# Patient Record
Sex: Female | Born: 1945 | Race: White | Hispanic: No | Marital: Single | Smoking: Never smoker
Health system: Southern US, Community
[De-identification: ages and names within clinical notes are randomized; demographics above are authoritative.]

## PROBLEM LIST (undated history)

## (undated) DIAGNOSIS — E119 Type 2 diabetes mellitus without complications: Secondary | ICD-10-CM

## (undated) DIAGNOSIS — M1990 Unspecified osteoarthritis, unspecified site: Secondary | ICD-10-CM

## (undated) DIAGNOSIS — IMO0001 Reserved for inherently not codable concepts without codable children: Secondary | ICD-10-CM

## (undated) DIAGNOSIS — R413 Other amnesia: Secondary | ICD-10-CM

## (undated) DIAGNOSIS — K589 Irritable bowel syndrome without diarrhea: Secondary | ICD-10-CM

## (undated) DIAGNOSIS — G894 Chronic pain syndrome: Secondary | ICD-10-CM

## (undated) DIAGNOSIS — I219 Acute myocardial infarction, unspecified: Secondary | ICD-10-CM

## (undated) DIAGNOSIS — F329 Major depressive disorder, single episode, unspecified: Secondary | ICD-10-CM

## (undated) DIAGNOSIS — M549 Dorsalgia, unspecified: Secondary | ICD-10-CM

## (undated) DIAGNOSIS — K219 Gastro-esophageal reflux disease without esophagitis: Secondary | ICD-10-CM

## (undated) DIAGNOSIS — E785 Hyperlipidemia, unspecified: Secondary | ICD-10-CM

## (undated) DIAGNOSIS — F410 Panic disorder [episodic paroxysmal anxiety] without agoraphobia: Secondary | ICD-10-CM

## (undated) DIAGNOSIS — N189 Chronic kidney disease, unspecified: Secondary | ICD-10-CM

## (undated) DIAGNOSIS — E041 Nontoxic single thyroid nodule: Secondary | ICD-10-CM

## (undated) DIAGNOSIS — IMO0002 Reserved for concepts with insufficient information to code with codable children: Secondary | ICD-10-CM

## (undated) DIAGNOSIS — I509 Heart failure, unspecified: Secondary | ICD-10-CM

## (undated) DIAGNOSIS — M810 Age-related osteoporosis without current pathological fracture: Secondary | ICD-10-CM

## (undated) DIAGNOSIS — E669 Obesity, unspecified: Secondary | ICD-10-CM

## (undated) DIAGNOSIS — F32A Depression, unspecified: Secondary | ICD-10-CM

## (undated) DIAGNOSIS — I10 Essential (primary) hypertension: Secondary | ICD-10-CM

## (undated) DIAGNOSIS — Z9289 Personal history of other medical treatment: Secondary | ICD-10-CM

## (undated) DIAGNOSIS — F419 Anxiety disorder, unspecified: Secondary | ICD-10-CM

## (undated) DIAGNOSIS — G4730 Sleep apnea, unspecified: Secondary | ICD-10-CM

## (undated) DIAGNOSIS — R569 Unspecified convulsions: Secondary | ICD-10-CM

## (undated) HISTORY — PX: BACK SURGERY: SHX140

## (undated) HISTORY — DX: Reserved for concepts with insufficient information to code with codable children: IMO0002

## (undated) HISTORY — PX: REPLACEMENT TOTAL KNEE: SUR1224

## (undated) HISTORY — DX: Chronic pain syndrome: G89.4

## (undated) HISTORY — DX: Depression, unspecified: F32.A

## (undated) HISTORY — DX: Unspecified osteoarthritis, unspecified site: M19.90

## (undated) HISTORY — PX: TONSILLECTOMY: SUR1361

## (undated) HISTORY — PX: ABDOMINAL HYSTERECTOMY: SHX81

## (undated) HISTORY — DX: Other amnesia: R41.3

## (undated) HISTORY — DX: Dorsalgia, unspecified: M54.9

## (undated) HISTORY — DX: Gastro-esophageal reflux disease without esophagitis: K21.9

## (undated) HISTORY — PX: CHOLECYSTECTOMY: SHX55

## (undated) HISTORY — DX: Hyperlipidemia, unspecified: E78.5

## (undated) HISTORY — DX: Major depressive disorder, single episode, unspecified: F32.9

## (undated) HISTORY — PX: CARDIAC CATHETERIZATION: SHX172

## (undated) HISTORY — DX: Obesity, unspecified: E66.9

## (undated) HISTORY — DX: Nontoxic single thyroid nodule: E04.1

## (undated) HISTORY — DX: Age-related osteoporosis without current pathological fracture: M81.0

## (undated) HISTORY — DX: Type 2 diabetes mellitus without complications: E11.9

## (undated) HISTORY — PX: APPENDECTOMY: SHX54

## (undated) HISTORY — DX: Anxiety disorder, unspecified: F41.9

## (undated) HISTORY — PX: OOPHORECTOMY: SHX86

## (undated) HISTORY — PX: FOOT SURGERY: SHX648

---

## 1998-02-06 ENCOUNTER — Other Ambulatory Visit: Admission: RE | Admit: 1998-02-06 | Discharge: 1998-02-06 | Payer: Self-pay | Admitting: Obstetrics and Gynecology

## 1998-11-10 ENCOUNTER — Other Ambulatory Visit: Admission: RE | Admit: 1998-11-10 | Discharge: 1998-11-10 | Payer: Self-pay | Admitting: *Deleted

## 1999-08-27 ENCOUNTER — Other Ambulatory Visit: Admission: RE | Admit: 1999-08-27 | Discharge: 1999-08-27 | Payer: Self-pay | Admitting: Gynecology

## 1999-11-06 ENCOUNTER — Encounter (INDEPENDENT_AMBULATORY_CARE_PROVIDER_SITE_OTHER): Payer: Self-pay | Admitting: Specialist

## 1999-11-06 ENCOUNTER — Inpatient Hospital Stay (HOSPITAL_COMMUNITY): Admission: RE | Admit: 1999-11-06 | Discharge: 1999-11-09 | Payer: Self-pay | Admitting: Gynecology

## 2000-12-04 ENCOUNTER — Other Ambulatory Visit: Admission: RE | Admit: 2000-12-04 | Discharge: 2000-12-04 | Payer: Self-pay | Admitting: Gynecology

## 2001-03-03 ENCOUNTER — Inpatient Hospital Stay (HOSPITAL_COMMUNITY): Admission: EM | Admit: 2001-03-03 | Discharge: 2001-03-05 | Payer: Self-pay | Admitting: Emergency Medicine

## 2001-03-05 ENCOUNTER — Encounter: Payer: Self-pay | Admitting: Cardiology

## 2001-03-17 ENCOUNTER — Encounter: Admission: RE | Admit: 2001-03-17 | Discharge: 2001-03-17 | Payer: Self-pay

## 2001-03-19 ENCOUNTER — Ambulatory Visit (HOSPITAL_COMMUNITY): Admission: RE | Admit: 2001-03-19 | Discharge: 2001-03-19 | Payer: Self-pay

## 2001-04-02 ENCOUNTER — Encounter (INDEPENDENT_AMBULATORY_CARE_PROVIDER_SITE_OTHER): Payer: Self-pay | Admitting: Specialist

## 2001-04-02 ENCOUNTER — Observation Stay: Admission: RE | Admit: 2001-04-02 | Discharge: 2001-04-03 | Payer: Self-pay

## 2002-01-20 ENCOUNTER — Encounter (HOSPITAL_COMMUNITY): Admission: RE | Admit: 2002-01-20 | Discharge: 2002-02-19 | Payer: Self-pay | Admitting: *Deleted

## 2003-04-04 ENCOUNTER — Ambulatory Visit (HOSPITAL_COMMUNITY): Admission: RE | Admit: 2003-04-04 | Discharge: 2003-04-04 | Payer: Self-pay | Admitting: Family Medicine

## 2003-04-04 ENCOUNTER — Encounter: Payer: Self-pay | Admitting: Family Medicine

## 2003-06-02 ENCOUNTER — Ambulatory Visit (HOSPITAL_COMMUNITY): Admission: RE | Admit: 2003-06-02 | Discharge: 2003-06-02 | Payer: Self-pay | Admitting: Orthopedic Surgery

## 2003-06-10 ENCOUNTER — Ambulatory Visit (HOSPITAL_COMMUNITY): Admission: RE | Admit: 2003-06-10 | Discharge: 2003-06-10 | Payer: Self-pay | Admitting: *Deleted

## 2003-06-10 ENCOUNTER — Encounter: Payer: Self-pay | Admitting: *Deleted

## 2003-06-13 ENCOUNTER — Encounter: Payer: Self-pay | Admitting: Orthopedic Surgery

## 2003-06-14 ENCOUNTER — Inpatient Hospital Stay (HOSPITAL_COMMUNITY): Admission: RE | Admit: 2003-06-14 | Discharge: 2003-06-17 | Payer: Self-pay | Admitting: Orthopedic Surgery

## 2003-06-17 ENCOUNTER — Inpatient Hospital Stay
Admission: RE | Admit: 2003-06-17 | Discharge: 2003-06-24 | Payer: Self-pay | Admitting: Physical Medicine & Rehabilitation

## 2003-06-17 ENCOUNTER — Encounter (HOSPITAL_COMMUNITY)
Admission: RE | Admit: 2003-06-17 | Discharge: 2003-06-18 | Payer: Self-pay | Admitting: Physical Medicine & Rehabilitation

## 2003-06-20 ENCOUNTER — Ambulatory Visit (HOSPITAL_COMMUNITY)
Admission: RE | Admit: 2003-06-20 | Discharge: 2003-06-20 | Payer: Self-pay | Admitting: Physical Medicine & Rehabilitation

## 2003-11-22 ENCOUNTER — Encounter: Admission: RE | Admit: 2003-11-22 | Discharge: 2003-11-22 | Payer: Self-pay | Admitting: Sports Medicine

## 2004-02-23 ENCOUNTER — Inpatient Hospital Stay (HOSPITAL_COMMUNITY): Admission: EM | Admit: 2004-02-23 | Discharge: 2004-02-28 | Payer: Self-pay | Admitting: Psychiatry

## 2004-02-23 ENCOUNTER — Emergency Department (HOSPITAL_COMMUNITY): Admission: EM | Admit: 2004-02-23 | Discharge: 2004-02-23 | Payer: Self-pay | Admitting: Emergency Medicine

## 2004-02-24 ENCOUNTER — Emergency Department (HOSPITAL_COMMUNITY): Admission: EM | Admit: 2004-02-24 | Discharge: 2004-02-24 | Payer: Self-pay | Admitting: Emergency Medicine

## 2004-02-28 ENCOUNTER — Ambulatory Visit (HOSPITAL_COMMUNITY): Admission: RE | Admit: 2004-02-28 | Discharge: 2004-02-28 | Payer: Self-pay | Admitting: Radiation Oncology

## 2004-03-15 ENCOUNTER — Emergency Department (HOSPITAL_COMMUNITY): Admission: EM | Admit: 2004-03-15 | Discharge: 2004-03-15 | Payer: Self-pay

## 2004-07-10 ENCOUNTER — Ambulatory Visit: Payer: Self-pay | Admitting: Psychiatry

## 2004-10-02 ENCOUNTER — Ambulatory Visit: Payer: Self-pay | Admitting: Psychiatry

## 2004-11-27 ENCOUNTER — Ambulatory Visit: Payer: Self-pay | Admitting: Psychiatry

## 2005-01-25 ENCOUNTER — Ambulatory Visit: Payer: Self-pay | Admitting: Psychology

## 2005-02-26 ENCOUNTER — Ambulatory Visit: Payer: Self-pay | Admitting: Psychology

## 2005-04-26 ENCOUNTER — Ambulatory Visit: Payer: Self-pay | Admitting: Psychology

## 2005-06-04 ENCOUNTER — Ambulatory Visit: Payer: Self-pay | Admitting: Psychology

## 2005-07-05 ENCOUNTER — Ambulatory Visit: Payer: Self-pay | Admitting: Psychology

## 2005-08-20 ENCOUNTER — Ambulatory Visit: Payer: Self-pay | Admitting: Psychiatry

## 2005-10-08 ENCOUNTER — Ambulatory Visit: Payer: Self-pay | Admitting: Psychology

## 2005-11-01 ENCOUNTER — Ambulatory Visit: Payer: Self-pay | Admitting: Psychology

## 2005-11-12 ENCOUNTER — Ambulatory Visit: Payer: Self-pay | Admitting: Psychiatry

## 2006-01-03 ENCOUNTER — Ambulatory Visit: Payer: Self-pay | Admitting: Psychology

## 2006-01-09 ENCOUNTER — Ambulatory Visit (HOSPITAL_COMMUNITY): Payer: Self-pay | Admitting: Psychiatry

## 2006-03-04 ENCOUNTER — Ambulatory Visit (HOSPITAL_COMMUNITY): Payer: Self-pay | Admitting: Psychiatry

## 2006-03-13 ENCOUNTER — Ambulatory Visit (HOSPITAL_COMMUNITY): Payer: Self-pay | Admitting: Psychology

## 2006-04-03 ENCOUNTER — Ambulatory Visit (HOSPITAL_COMMUNITY): Payer: Self-pay | Admitting: Psychiatry

## 2006-05-21 ENCOUNTER — Ambulatory Visit (HOSPITAL_COMMUNITY): Payer: Self-pay | Admitting: Psychology

## 2006-06-24 ENCOUNTER — Ambulatory Visit (HOSPITAL_COMMUNITY): Payer: Self-pay | Admitting: Psychiatry

## 2006-08-21 ENCOUNTER — Ambulatory Visit (HOSPITAL_COMMUNITY): Payer: Self-pay | Admitting: Psychiatry

## 2006-09-17 ENCOUNTER — Inpatient Hospital Stay (HOSPITAL_COMMUNITY): Admission: RE | Admit: 2006-09-17 | Discharge: 2006-09-23 | Payer: Self-pay | Admitting: Orthopedic Surgery

## 2006-09-18 ENCOUNTER — Ambulatory Visit: Payer: Self-pay | Admitting: Physical Medicine & Rehabilitation

## 2006-09-28 ENCOUNTER — Ambulatory Visit: Payer: Self-pay | Admitting: Orthopedic Surgery

## 2006-09-30 ENCOUNTER — Ambulatory Visit: Payer: Self-pay | Admitting: Orthopedic Surgery

## 2006-10-03 ENCOUNTER — Ambulatory Visit: Payer: Self-pay | Admitting: Orthopedic Surgery

## 2006-11-18 ENCOUNTER — Ambulatory Visit (HOSPITAL_COMMUNITY): Payer: Self-pay | Admitting: Psychiatry

## 2007-01-15 ENCOUNTER — Ambulatory Visit (HOSPITAL_COMMUNITY): Payer: Self-pay | Admitting: Psychiatry

## 2007-03-10 ENCOUNTER — Ambulatory Visit (HOSPITAL_COMMUNITY): Payer: Self-pay | Admitting: Psychiatry

## 2007-05-12 ENCOUNTER — Ambulatory Visit (HOSPITAL_COMMUNITY): Payer: Self-pay | Admitting: Psychiatry

## 2007-07-07 ENCOUNTER — Ambulatory Visit (HOSPITAL_COMMUNITY): Payer: Self-pay | Admitting: Psychiatry

## 2007-09-08 ENCOUNTER — Ambulatory Visit (HOSPITAL_COMMUNITY): Payer: Self-pay | Admitting: Psychiatry

## 2007-10-27 ENCOUNTER — Ambulatory Visit (HOSPITAL_COMMUNITY): Payer: Self-pay | Admitting: Psychiatry

## 2007-11-20 ENCOUNTER — Inpatient Hospital Stay (HOSPITAL_COMMUNITY): Admission: AD | Admit: 2007-11-20 | Discharge: 2007-11-25 | Payer: Self-pay | Admitting: Urology

## 2007-12-24 ENCOUNTER — Observation Stay (HOSPITAL_COMMUNITY): Admission: EM | Admit: 2007-12-24 | Discharge: 2007-12-27 | Payer: Self-pay | Admitting: Emergency Medicine

## 2008-01-21 ENCOUNTER — Ambulatory Visit (HOSPITAL_COMMUNITY): Payer: Self-pay | Admitting: Psychiatry

## 2008-03-03 ENCOUNTER — Ambulatory Visit (HOSPITAL_COMMUNITY): Payer: Self-pay | Admitting: Psychiatry

## 2008-03-08 ENCOUNTER — Inpatient Hospital Stay (HOSPITAL_COMMUNITY): Admission: AD | Admit: 2008-03-08 | Discharge: 2008-03-08 | Payer: Self-pay | Admitting: Gynecology

## 2008-05-03 ENCOUNTER — Ambulatory Visit (HOSPITAL_COMMUNITY): Payer: Self-pay | Admitting: Psychiatry

## 2008-05-16 ENCOUNTER — Inpatient Hospital Stay (HOSPITAL_COMMUNITY): Admission: EM | Admit: 2008-05-16 | Discharge: 2008-05-19 | Payer: Self-pay | Admitting: Emergency Medicine

## 2008-05-17 ENCOUNTER — Ambulatory Visit (HOSPITAL_COMMUNITY): Payer: Self-pay | Admitting: Psychiatry

## 2008-06-28 ENCOUNTER — Ambulatory Visit (HOSPITAL_COMMUNITY): Payer: Self-pay | Admitting: Psychiatry

## 2008-07-11 ENCOUNTER — Ambulatory Visit: Payer: Self-pay | Admitting: Internal Medicine

## 2008-07-11 ENCOUNTER — Inpatient Hospital Stay (HOSPITAL_COMMUNITY): Admission: EM | Admit: 2008-07-11 | Discharge: 2008-07-26 | Payer: Self-pay | Admitting: Emergency Medicine

## 2008-07-11 ENCOUNTER — Ambulatory Visit: Payer: Self-pay | Admitting: Cardiovascular Disease

## 2008-07-13 ENCOUNTER — Encounter (INDEPENDENT_AMBULATORY_CARE_PROVIDER_SITE_OTHER): Payer: Self-pay | Admitting: Internal Medicine

## 2008-07-14 ENCOUNTER — Ambulatory Visit: Payer: Self-pay | Admitting: Physical Medicine & Rehabilitation

## 2008-08-04 ENCOUNTER — Ambulatory Visit (HOSPITAL_COMMUNITY): Payer: Self-pay | Admitting: Psychiatry

## 2008-08-23 ENCOUNTER — Ambulatory Visit (HOSPITAL_COMMUNITY): Payer: Self-pay | Admitting: Psychiatry

## 2008-09-06 ENCOUNTER — Encounter: Admission: RE | Admit: 2008-09-06 | Discharge: 2008-09-06 | Payer: Self-pay | Admitting: Neurology

## 2008-09-22 ENCOUNTER — Ambulatory Visit (HOSPITAL_COMMUNITY): Payer: Self-pay | Admitting: Psychiatry

## 2008-09-30 ENCOUNTER — Ambulatory Visit (HOSPITAL_COMMUNITY): Admission: RE | Admit: 2008-09-30 | Discharge: 2008-09-30 | Payer: Self-pay | Admitting: Neurology

## 2008-10-25 ENCOUNTER — Ambulatory Visit (HOSPITAL_COMMUNITY): Admission: RE | Admit: 2008-10-25 | Discharge: 2008-10-25 | Payer: Self-pay | Admitting: Internal Medicine

## 2008-11-17 ENCOUNTER — Ambulatory Visit (HOSPITAL_COMMUNITY): Payer: Self-pay | Admitting: Psychiatry

## 2008-11-23 ENCOUNTER — Ambulatory Visit (HOSPITAL_COMMUNITY): Payer: Self-pay | Admitting: Psychology

## 2008-12-08 ENCOUNTER — Ambulatory Visit (HOSPITAL_COMMUNITY): Payer: Self-pay | Admitting: Psychology

## 2008-12-23 ENCOUNTER — Ambulatory Visit (HOSPITAL_COMMUNITY): Payer: Self-pay | Admitting: Psychology

## 2009-01-12 ENCOUNTER — Ambulatory Visit (HOSPITAL_COMMUNITY): Payer: Self-pay | Admitting: Psychiatry

## 2009-01-23 ENCOUNTER — Ambulatory Visit (HOSPITAL_COMMUNITY): Payer: Self-pay | Admitting: Psychology

## 2009-03-28 ENCOUNTER — Ambulatory Visit (HOSPITAL_COMMUNITY): Payer: Self-pay | Admitting: Psychiatry

## 2009-03-31 ENCOUNTER — Inpatient Hospital Stay: Payer: Self-pay | Admitting: Unknown Physician Specialty

## 2009-04-25 ENCOUNTER — Ambulatory Visit (HOSPITAL_COMMUNITY): Payer: Self-pay | Admitting: Psychiatry

## 2009-07-20 ENCOUNTER — Ambulatory Visit (HOSPITAL_COMMUNITY): Payer: Self-pay | Admitting: Psychiatry

## 2009-08-17 ENCOUNTER — Inpatient Hospital Stay: Payer: Self-pay | Admitting: Internal Medicine

## 2009-09-20 ENCOUNTER — Ambulatory Visit: Payer: Self-pay | Admitting: Pain Medicine

## 2010-01-17 ENCOUNTER — Ambulatory Visit: Payer: Self-pay | Admitting: Pain Medicine

## 2011-02-09 ENCOUNTER — Inpatient Hospital Stay: Payer: Self-pay | Admitting: Internal Medicine

## 2011-03-19 NOTE — H&P (Signed)
NAMENAYSHA, SHOLL NO.:  [id number]   MEDICAL RECORD NO.:  [id number]          PATIENT TYPE:  INP   LOCATION:  1506                         FACILITY:  [facility]   PHYSICIAN:  Lonia Blood, M.D.       DATE OF BIRTH:  Nov 02, 1946   DATE OF ADMISSION:  12/24/2007  DATE OF DISCHARGE:                              HISTORY & PHYSICAL   PRIMARY CARE PHYSICIAN:  Estell Harpin, M.D.   CHIEF COMPLAINTS:  Back pain.   HISTORY OF PRESENT ILLNESS:  Mrs. Karabin is a 65 year old woman with  known uncontrolled diabetes mellitus type 2 who presented to the  emergency room after about 5 days of worsening sore on the back with  back pain.  The patient noticed for the first time edema and erythema on  the back about 5 days prior to admission.  She went to her primary care  physician who prescribed some Keflex.  The patient continued to have  worsening of her back pain, also noticed redness on the stomach as well.  The patient also noticed a lump in the right axilla as well.  As she was  thinking that all these things will get out of control, she reported the  emergency room to get help today.  The patient  complains also of some  chills and she is worried that these ulcers will get out of control.   PAST MEDICAL HISTORY:  1. Diabetes mellitus type 2 uncontrolled.  2. Fibromyalgia.  3. Widespread osteoarthritis.  4. Depression with suicide attempt.  5. Recurrent cystitis.   HOME MEDICATIONS:  1. Percocet 10/325 three times a day.  2. Lexapro 10 mg a day.  3. Xanax 1 mg three times day.  4. Ambien 10 mg at bedtime.  5. Lantus 50 units at bedtime.  6. Metformin 1000 mg two times a day.   FAMILY HISTORY:  The patient's father died with lung cancer.  The  patient's mother died with stomach cancer.   SOCIAL HISTORY:  The patient lives alone.  She does smoke cigarettes,  does not drink alcohol.  She has three healthy children.  She is on  disability due to her depression.  She  denies any substance abuse.   ALLERGIES:  NO KNOWN DRUG ALLERGIES.   REVIEW OF SYSTEMS:  Diffuse musculoskeletal pain, neuropathy in the  lower extremities, chronic anxiety.  Other systems per HPI.  All other  systems are negative.   PHYSICAL EXAMINATION:  VITAL SIGNS:  Temperature of 100.2, blood  pressure 111/58, pulse of 100, respirations 20, saturation 90% on room  air.  GENERAL:  An obese, anxious woman sitting on the stretcher in no acute  distress, alert to place, person and time.  HEENT:  Normocephalic, atraumatic.  Pupils are equal, round and reactive  to light and accommodation.  Extraocular motions intact.  Throat clear.  NECK:  Supple.  No JVD.  CARDIOVASCULAR:  Regular rate and rhythm without murmurs, rubs or  gallops.  CHEST:  On the back of the chest there is 4 cm area of erythema with  central scabbing consistent  with cellulitis.  LUNGS:  Clear to auscultation without wheezes, rhonchi or crackles.  ABDOMEN:  The abdominal wall on the right lower quadrant has about 10 cm  area of erythema with a central scabbing.  No definite abscess  formation.  Bowel sounds are present.  Abdomen is without any deep  tenderness and no guarding or rebound.  EXTREMITIES:  Without edema without any skin changes.   LABORATORY DATA:  White blood cell count 10,000, hemoglobin 13, platelet  count 187,000, glucose is 187.  Sodium 139, potassium 3.8, bicarb 26,  calcium 9.2, albumin 3.5.   ASSESSMENT AND PLAN:  A 65 year old woman with uncontrolled diabetes  mellitus presenting with multiple skin abscesses and cellulitis  consistent with a community-acquired methicillin resistant  Staphylococcus aureus.  Mrs. Jessie will be admitted to a regular room.  She will have wound cultures and blood cultures.  The patient will be  placed on isolation.  Empiric vancomycin will be started.  The diabetes  will be controlled with Lantus and sliding scale insulin.  Hemoglobin  A1c will be measured to  have to see her chronic control.  Mrs. Loring  will be continued on her Lexapro and Xanax without any changes.      Lonia Blood, M.D.  Electronically Signed     SL/MEDQ  D:  12/24/2007  T:  12/25/2007  Job:  [id number]

## 2011-03-19 NOTE — Discharge Summary (Signed)
NAMEISADORE, BOKHARI NO.:  [id number]   MEDICAL RECORD NO.:  [id number]          PATIENT TYPE:  INP   LOCATION:  3003                         FACILITY:  [facility]   PHYSICIAN:  Alvester Morin, M.D.  DATE OF BIRTH:  1946/10/13   DATE OF ADMISSION:  07/11/2008  DATE OF DISCHARGE:                               DISCHARGE SUMMARY   ADDENDUM:  This is an addendum to previously dictated discharge summary  for Ms. Colon Branch.  Ms. Miki Labuda had an uneventful hospital  stay since her last dictation, while skilled nursing facility bed was  awaited.  She was discharged on July 26, 2008.  Her medical drug  list remains unchanged.   VITAL SIGNS:  On the day of discharge her vitals were as follows:  Temperature 98.3, pulse 85, respirations 18, systolic blood pressure  122, diastolic blood pressure was 75.  She was saturating 94% on room  air.   DISCHARGE LABS:  The patient had a WBC count of 5.6, hemoglobin 13.1,  hematocrit 39.3, platelet of 186,000.  Sodium 138, potassium 4.1,  chloride 106, bicarb 26, glucose 159, BUN 9, creatinine 0.73.      Clerance Lav, MD  Electronically Signed      Alvester Morin, M.D.  Electronically Signed    RS/MEDQ  D:  07/26/2008  T:  07/26/2008  Job:  [id number]

## 2011-03-19 NOTE — H&P (Signed)
[name], [name] NO.:  [id number]   MEDICAL RECORD NO.:  [id number]          PATIENT TYPE:  INP   LOCATION:  1505                         FACILITY:  [facility]   PHYSICIAN:  Jamison Neighbor, M.D.  DATE OF BIRTH:  05/27/1946   DATE OF ADMISSION:  11/20/2007  DATE OF DISCHARGE:                              HISTORY & PHYSICAL   ADMITTING DIAGNOSES:  1. Chronic cystitis.  2. Gross hematuria.  3. Out of control diabetes mellitus.   HISTORY:  This 65 year old is undergoing evaluation for problems of  chronic cystitis.  She has had a recent scan which showed normal upper  tracks.  The patient has had persistent hematuria, so she underwent a  cystoscopic examination today.   Cystoscopy was performed.  The bladder was carefully inspected and we  could not find any evidence of bleeding within the bladder.  We do know,  however, that her urine still is abnormal despite antibiotic therapy and  the patient's overall condition is worsening.   My biggest concern is that her sugar is now consistently in the 400 to  500 range, and I feel that she needs to be brought into the hospital for  antibiotic therapy and management of the diabetes.  Her medical doctor,  to my understanding does not admit people to the hospital, so we will  have the [facility] hospitalist service work with her while she is in the  hospital.   The patient has multiple health problems.  She takes metformin for her  diabetes, metoprolol XL for her heart condition, Lexapro, Ambien,  Glipizide, Xanax for anxiety, and she had been taking Abilify, but said  she stopped taking it because she could not tolerate it.  She says her  biggest problem right now is arthritis.  She also notes major problems  with recent weight gain, saying she has gained over 100 pounds over the  past year.  She has seen the podiatrist because of persistent foot  problems, as well.   The patient could not provide a lot of additional  information including  family history, social history and review of systems.   PHYSICAL EXAMINATION:  On examination today she is an ill-appearing  female in no acute distress.  HEENT:  Normocephalic and atraumatic.  Cranial nerves 2-12 were grossly  intact.  NECK:  Supple, no adenopathy or thyromegaly.  LUNGS:  Clear.  HEART:  Regular rate and rhythm, without murmurs, thrills, gallops,  rubs, or heaves.  ABDOMEN:  Obese.  There is tenderness above the suprapubic area.  She  has got a little bit of flank tenderness on both sides, but is somewhat  vague in her response.  PELVIC:  Shows some degree of atrophic vaginitis and a lot of irritation  in the vaginal area.  We are going to try to treat that with estrogen  cream.  EXTREMITIES:  No cyanosis, clubbing, or edema.   IMPRESSION:  1. Gross hematuria.  2. Chronic pyelonephritis.  3. Out of control diabetes.   PLAN:  Admit for antibiotic therapy and consultation with medicine.  Jamison Neighbor, M.D.  Electronically Signed     RJE/MEDQ  D:  11/20/2007  T:  11/20/2007  Job:  [id number]

## 2011-03-19 NOTE — Discharge Summary (Signed)
[name], [name] NO.:  [id number]   MEDICAL RECORD NO.:  [id number]          PATIENT TYPE:  INP   LOCATION:  3741                         FACILITY:  [facility]   PHYSICIAN:  Alvester Morin, M.D.  DATE OF BIRTH:  August 11, 1946   DATE OF ADMISSION:  07/11/2008  DATE OF DISCHARGE:                               DISCHARGE SUMMARY   DISCHARGE DIAGNOSES:  1. Rhabdomyolysis & Acute renal failure.  2. Major Depression with sucidal ideation.  3. Urinary tract infection.  4. Diabetes mellitus type 2.  5. Hypertension.  6. Leukocytosis.   DISCHARGE MEDICATIONS:  1. Amlodipine 5mg  p.o. daily.  2. Aspirin 325 mg p.o. daily.  3. Cymbalta dose to be determined.  4. Lantus insulin 22 units before night going to dinner.  5. MiraLax daily.  6. Potassium chloride 40 mEq daily.  7. Ambien  10 mg p.o. daily.  8. Ativan 1 mg p.o. 3 times a day as needed.  9. Percocet  5/325 q.6 h p.r.n.   DISPOSITION AND FOLLOWUP:  We plan to arrange skilled nursing facilty  discharge for the patient.  The patient will follow up with her primary  care Tamy Accardo after SNF release.  She will make appointment by herself.   PROCURERS PERFORMED:  None.   CONSULTATION:  Inpatient Psychiatrist Dr. Jeanie Sewer.   ADMITTING HISTORY AND PHYSICAL:  A 65 -year-old white female with  history of frequent fall, severe deconditioning, hypertension, diabetes,  UTI, including pyelonephritis and major depression presented with  history of fall one day before the admission.  She fell in the afternoon  one day prior to day of admission and could not move for 18 hours until  discovered by her family on the morning of the day of admission. The  patient was lying on the floor unable to get up. Pt was conscious.  laying on the floor and had bruises on the arm and cheek.  The patient  reports that she was walking with help of her walker to go the bathroom  at the time of fall.  She lost her balance and fell down.   Subsequently  tried to crawl but could not get up due to her size and severe  deconditioning, that she had experienced after discharged from Skilled  Nursing Facility 3 weeks ago.  She had no reports of fever, chills,  frequency, urgency, diarrhea, dizziness, or blackout before or after the  fall.  She has no history of syncope.  She has a history of abdominal  pain that is ongoing for few months.  She grades her abdominal pain  7/10, which is mainly at the lower abdomen and also on the left side of  the abdomen.  Her pain does not radiate.  There is no aggravating or  relieving factor known.  Her pain is constant and is not associated with  the bowel or bladder movements.  She reports tingling and numbness in  her lower feet bilaterally.  She fears that she is having a stroke.  She  also has the history of tremors that had started a few months ago  when  she was put on a new medication, Abilify.  She has no complain of  distrubance in speech, vision, or arm strength appreciated.   ALLERGIES:  She is not known to be allergic to any medications.   PAST MEDICAL HISTORY:  Positive for hypertension, diabetes with HbA1c of  9.0 on December 24, 2007 and 7.7 on October 18, 2007.  She has obesity.  She had MRSA abscess in February 2009.  She has history of arthritis,  anxiety, depression, urinary tract infection in May 19, 2008 with E.  coli and treated with Rocephin for 10 days.  She had pyelonephritis in  February 2009, and she also history of fibromyalgia.   SURGICAL HISTORY:  She had hysterectomy when she was 65 years old.  She  had knee replacement bilaterally.  The right knee was replaced in 2004,  and left was replaced in 2007.  On admission, she provided the history  of taking Xanax 1 mg 2 times a day.  However, she describes that she had  discontinued taking the medication at the end of August.  Her medication  of admission: Lantus  45 units, metformin 100 mg twice a day, Percocet   10 mg 3 times a day, Abilify 20 mg nightly, Actos 45 mg once a day,  amlodipine 5 mg once a day, citalopram 40 mg once a day and Januvia 100  mg once a day.  She had finished taking Ciprofloxacin day 10 on the day  of admission.  She has no substance or alcohol abuse history at that  time of admission.   SOCIAL HISTORY:  She is single.  She is educated up to high school.  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  She  used be a Location manager and worked until 2005.  At that time, she  applied for disability.  She lives at the [facility] by herself.   TOTAL FAMILY HISTORY:  Her mother had a stomach cancer at the age 67.  She was a smoker.  Her father died of lung cancer at age of 7 and he  was smoker.  She has 2 sisters and a brother.  They are alive and  healthy.  She has 2 sons and a daughter and they are healthy.   REVIEW OF SYSTEMS:  As per HPI.   PHYSICAL EXAMINATION:  Her physical exam at the time of presentation,  VITAL SIGNS:  Temperature 99.6, blood pressure 143/80, pulse 112,  respiratory rate of 12, and O2 sat of 98% on air.  GENERAL:  She was anxious and sweating in the bed.  EYE:  EOMI.  PERRLA.  Left side ecchymosis from the fall.  HEENT:  No ear discharge.  Neck supple and full range of motion.  RESPIRATION:  Normal bilateral air entry.  No wheezing.  No shortness of  breath.  No rales or rhonchi.  CARDIOVASCULAR:  S1 and S2 normal.  No rales and murmur.  GI:  Abdomen was soft.  Tender on left lower side.  Fungal infection was  observed in the folds of abdomen.  EXTREMITIES:  No bruises.  No warmth or pain.  No fractures palpated.  GU:  Dark urine was observed in the poly bag.  No lymphadenopathy.  NEURO:  Alert and awake and oriented x3.  Nonfocal.  Neurological exam  lower and upper extremity power 3-4/5.  Sensation intact bilaterally.  Reflex is +1.  Tremor is present at rest  PSYCH:  She was anxious but in stable mood.  She rarely  made eye  contact.   LABS ON THE ADMISSION:  Sodium of 140, potassium of 3.1, chloride of  111, bicarbonate 20, BUN of 21, creatinine 1.7, and glucose of 161, CK  was 25956, CK-MB was 305.8.   CT and x-rays were within normal limits.  She also had x-ray that was  fine and chest, hip, and left humeral there were all within normal  limits.   TSH of 2.8 was observed.  Hemoglobin was 15, hematocrit of 44, white  blood cell count of 14.1, platelet of 188 with ANC of 11.8 and 83% were  neutrophils.   HOSPITAL COURSE:  1. Rhabdomyolysis & Acute renal failure.  The patient presented with a      history of fall that happened 18 hours ago.  Most likely thought to      be secondary to deconditioning and obesity.  She was unable to get      up or get help until she was discovered by her family.  She had CK-      MB and CK elevated.  Her CK was 41,591.  Her urine exam was      obtained, which was positive for myoglobin.  She was treated with      rapid normal saline infusion. She also had measurement of CK and CK-      MB daily.  She had steady improvement throughout her      hospitalization and her CK levels fell down to 356 on July 18, 2008.  Her creatinine lowered from 1.7 on presentation to 0.65 on      July 18, 2008.  She has likely completely resolved her      rhabdomyolysis.  2. Depression & Sucidal ideation: During her hospitalisation course,      patient communicated her desire to hurt herself to support staff.      Immediate measures were taken to prevent sucide and a sitter was      provided for sucide watch. Dr. Jeanie Sewer was consulted and initial      recommedation was made to transfer her to [facility]. However, due to her severe deconditioning, she was      not eligible. Dr. Jeanie Sewer was again consulted on 07/20/08 to      reassess her condtion and he determined that patient was no longer      sucidal and appropriate to release for  SNF. She was put on Cymbalta      and dose is being adjusted for maximum benifit.  3. Deconditioning.  The patient has been hospitalized in the past in      July 2009, and was discharged to Skilled Nursing Facility, which      helped her get more strength.  She is morbidly obese and also has      major depression.  All of these factors contributed to her rapid      deconditioning.  During the course of the hospitalization, PT/OT      consult was obtained.  She was encouraged to be physically active.      At present, it is determined that she is unable to perform the      daily activities,  at the most she can get out of the bed and get      up to the chair.  She needs a constant support.  The  patient will      be discharged to [facility] in [place].  They would      upon completion of treatment would release her to SNF where she can      have assistance and training to improve her deconditioning.  4. UTI.  The patient on admission had grossly abnormal urine.  Her      urinalysis had shown E. coli that were sensitive to ceftriaxone.      The patient was treated with 1 g ceftriaxone for 7 days.  The      patient was discharged home without any medication.  The patient      will follow up with her primary care Karisha Marlin and get a repeat      urinalysis for further management of UTI.  5. Diabetes mellitus.  The patient is treated with Lantus sliding      scale insulin during the hospital admission.  Her CBG on the day of      discharge ranged from 133 to 210.  The patient will be discharged      on 22 units of Lantus insulin.  6. Hypertension.  Her blood pressure on the date of admission was      142/88.  Her amlodipine was on hold until her blood pressure      recovered.  On the day of discharge, she was getting amlodipine and      her blood pressure was 148/75.  The patient will continue to have      amlodipine 5 mg upon discharge.  7. Leukocytosis.  The patient had a white  blood cell count of 14.1      with ANC of 11.8.  It was thought it was likely secondary to      rhabdomyolysis as well as UTI that patient was experiencing.  The      patient had ceftriaxone IV for      7 day to treat UTI as well as hydration and continuous vigilance      for  rhabdomyolysis which has resolved.  At the time of discharge,      her leukocytosis has resolved.   Discharge labs and vitals to be obtained at the day of discharge.  Addendum will be done.      Clerance Lav, MD  Electronically Signed      Alvester Morin, M.D.  Electronically Signed    RS/MEDQ  D:  07/18/2008  T:  07/19/2008  Job:  [id number]   cc:   Antonietta Breach, M.D.  Estell Harpin, M.D.

## 2011-03-19 NOTE — Discharge Summary (Signed)
Whitney, [name] NO.:  [id number]   MEDICAL RECORD NO.:  [id number]          PATIENT TYPE:  INP   LOCATION:  1301                         FACILITY:  [facility]   PHYSICIAN:  Hillery Aldo, M.D.   DATE OF BIRTH:  1946/09/13   DATE OF ADMISSION:  05/15/2008  DATE OF DISCHARGE:  05/19/2008                               DISCHARGE SUMMARY   ADDENDUM:   PRIMARY CARE PHYSICIAN:  Estell Harpin, M.D.   DISCHARGE DIAGNOSES:  1. Escherichia coli urinary tract infection.  2. Severe weakness and deconditioning.  3. Severe anxiety and depression.  4. Type 2 diabetes.  5. Chronic pain syndrome.  6. Morbid obesity.  7. Osteoarthritis.  8. Hypertension.  9. History of methicillin-resistant Staphylococcus aureus skin      abscesses.   DISCHARGE MEDICATIONS:  1. Xanax 1 mg p.o. q.i.d.  2. Norvasc 5 mg p.o. daily.  3. Abilify 20 mg p.o. nightly.  4. Aspirin 81 mg p.o. daily.  5. Ceftin 500 mg p.o. q.12 h. times 5 days.  6. Celexa 40 mg p.o. daily.  7. Lantus insulin 45 units subcutaneously nightly.  8. NovoLog sliding scale insulin every morning and at night -- insulin      resistant scale.  9. Metformin 1000 mg p.o. b.i.d.  10.OxyContin 10 mg p.o. q.12 h.  11.Protonix 40 mg p.o. daily.  12.Actos 45 mg p.o. daily.  13.Lyrica 50 mg p.o. t.i.d., titrate for pain relief.  14.Senokot 1 tablet nightly.  15.Januvia 100 mg p.o. daily.  16.Flexeril 10 mg p.o. q.8 h. p.r.n.  17.Oxycodone 5 mg p.o. q.4 h. p.r.n.  18.Ambien 10 mg p.o. nightly p.r.n.   CONSULTATIONS:  None.   BRIEF ADMISSION HISTORY OF PRESENT ILLNESS:  The patient is a 65-year-  old female who presented to the emergency department with complaints of  severe weakness and difficulty walking, as well as muscle spasms and  worsening pain in the setting of a history of chronic pain syndrome.  For the full details, please see the dictated report done by Dr.  Lovell Sheehan.   For a complete list of the procedures,  diagnostic studies and hospital  course through May 17, 2008, please see the previously dictated  discharge summary done by Dr. Lonia Blood.   REMAINDER OF HOSPITAL COURSE BY PROBLEM:  1. Weakness:  The patient continues to be weak and deconditioned.  She      will need intensive physical and occupational therapy in the      nursing home environment.  2. Escherichia coli urinary tract infection:  The patient was treated      with 3 days of therapy with Rocephin.  She will complete an      additional 5 days of therapy with Ceftin.  3. Diabetes mellitus:  The patient's diabetes is well-controlled, with      the regimen as outlined above.  4. Chronic pain syndrome:  The patient was put on OxyContin and Lyrica      was added.  At this point, she was achieving some measure of pain      control.  Her Lyrica should be titrated up as needed for ongoing      pain management.  5. Morbid obesity:  The patient should be maintained on a diabetic      carbohydrate-modified diet.  6. Hypertension:  The patient's blood pressure is well-controlled.  7. Anxiety/depression:  The patient should have an outpatient      psychiatric consultation and follow-up.  She was maintained on her      psychoactive therapies including Abilify and Xanax, which has been      titrated up due to ongoing complaints of tremulousness.   DISPOSITION:  The patient is medically stable, and will be discharged to  [facility] [place] skilled nursing facility when  transportation is arranged.      Hillery Aldo, M.D.  Electronically Signed     CR/MEDQ  D:  05/19/2008  T:  05/19/2008  Job:  [id number]   cc:   Estell Harpin, M.D.  Fax: [phone]

## 2011-03-19 NOTE — Consult Note (Signed)
NAMEPOSIE, LILLIBRIDGE NO.:  [id number]   MEDICAL RECORD NO.:  [id number]          PATIENT TYPE:  INP   LOCATION:  1506                         FACILITY:  [facility]   PHYSICIAN:  Adolph Pollack, M.D.DATE OF BIRTH:  10/16/1946   DATE OF CONSULTATION:  DATE OF DISCHARGE:                                 CONSULTATION   REASON FOR CONSULTATION:  Multiple subcutaneous abscesses.   HISTORY OF PRESENT ILLNESS:  This 65 year old female stated she had  noted some areas of redness on her right lower abdominal wall on  Saturday.  Had some fever, chills and a discomfort in her back and two  others in her right groin and one in her right axilla.  She presented to  the hospital yesterday because of discomfort and pain and was admitted  and placed on intravenous antibiotics (vancomycin).  We were asked to  see her today to see if there are any of these that need to be incised  and drained.  She states she has not had anything like this before.   PAST MEDICAL HISTORY:  1. Diabetes mellitus on insulin.  2. Hypertension.  3. Chronic cystitis.  4. Fibromyalgia.  5. Morbid obesity.  6. Degenerative joint disease.  7. Depression.  8. Neuropathy.   Previous abdominal operations:  None.  She has had two knee  replacements.   ALLERGIES:  None.   SOCIAL HISTORY:  Notable for the fact that she lives alone.  She does  not smoke or drink.   REVIEW OF SYSTEMS:  She said she has had some fever, mostly chills since  she has been here in the hospital.  CARDIOVASCULAR:  Has not had any  chest pain.  PULMONARY:  No shortness of breath.   PHYSICAL EXAM:  An overweight female weighing about 280 pounds.  VITAL SIGNS:  Her temperature is 99.8, blood pressure is 118/68, pulse  of 82.  ABDOMEN:  In the right lower quadrant there are two punctate draining  wounds and an area of surrounding induration and cellulitis but no  fluctuant region.  GU:  Down in the right groin there are two very  small 5-6 mm red areas  that have spontaneously drained with minimal induration.  EXTREMITIES:  There is a small area of erythema measuring about 6-7 mm  with no fluctuance and minimal induration in the right axilla.  BACK:  There is an area of erythema and induration in the left mid back  with a small draining wound.  This is larger, maybe measuring up to 8  cm, but again no fluctuant area.   LABORATORY DATA:  White blood cell count is normal at 9700.  Hemoglobin  A1c is 9.0.  Cultures are pending.   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  There is no obvious area that needs further  incision and drainage at this time.   PLAN:  Shower twice a day.  Broaden polymicrobial coverage and continue  the wound surveillance.  Adolph Pollack, M.D.  Electronically Signed     TJR/MEDQ  D:  12/25/2007  T:  12/26/2007  Job:  [id number]

## 2011-03-19 NOTE — Consult Note (Signed)
NAMEDESTYNI, HOPPEL NO.:  [id number]   MEDICAL RECORD NO.:  [id number]          PATIENT TYPE:  INP   LOCATION:  3003                         FACILITY:  [facility]   PHYSICIAN:  Antonietta Breach, M.D.  DATE OF BIRTH:  04-09-1946   DATE OF CONSULTATION:  07/20/2008  DATE OF DISCHARGE:                                 CONSULTATION   Ms. Akens is no longer having suicidal thoughts.  She still does have  depressed mood, decreased energy and decreased appetite.  She needed 1  mg of Ativan last night to sleep.  This was on top of her Ambien 10 mg.  She does not have any hallucinations or delusions.  She is cooperative  with bedside care.  She is motivated for further psychiatric treatment.   REVIEW OF SYSTEMS:  MUSCULOSKELETAL:  She is still very weak and is  requiring physical therapy for regaining ambulation.   LABORATORY DATA:  Sodium 142, BUN 13, creatinine 0.68, glucose 126.   PHYSICAL EXAMINATION:  VITAL SIGNS:  Temperature 97.0, pulse 94,  respiratory rate 20, blood pressure 135/79, O2 saturation on room air  95%.   MENTAL STATUS EXAM:  Ms. Recendiz is alert.  Her eye contact is  intermittent.  She is oriented to all spheres.  Her affect is slightly  anxious.  Her mood is depressed.  Concentration mildly decreased.  She  is oriented to all spheres.  Memory is intact to immediate, recent and  remote.  Her speech is slightly soft and with a mildly flat prosody.  There is no dysarthria.  Thought process is logical, coherent, goal-  directed.  No looseness of associations.  Thought content - no thoughts  of harming herself, no thoughts of harming others, no delusions, no  hallucinations.  Insight is intact.  Judgment is intact.   ASSESSMENT:  AXIS I:  1. 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  2. 293.84, anxiety disorder not otherwise specified.  3. 296.33, major depressive disorder recurrent,  severe.   Ms. Jutte is not at risk to harm herself.  She does agree to call  emergency services immediately for any thoughts of harming herself,  thoughts of harming others or distress.  The undersigned provided ego  supportive psychotherapy and education.  The indications, alternatives  and adverse effects of Cymbalta were discussed with the patient for anti  depression.  She understands and would like to proceed with Cymbalta.   RECOMMENDATIONS:  Ms. Tessler was not accepted to the geropsychiatric  hospital.  Her suicidal thoughts have resolved.  She is psychiatrically  cleared for discharge to a skilled nursing facility while going through  her rehabilitation.   Would start her Cymbalta at 20 mg p.o. q.a.m. after discontinuing the  Celexa and would increase to 20 mg p.o. b.i.d. in 2 days then would  increase as tolerated to 30 mg p.o. b.i.d. by day 6.  No change yet in  Ambien or Ativan.  Would monitor for the onset of loose stools or nausea  while increasing the Cymbalta.  Would continue ego support.  Would  ask the case manager to set Ms. Berquist up with her outpatient  psychiatric followup within 1 week of discharge.  Would ask that the  skilled nursing facility provide transportation back and forth to her  psychiatrist as well as psychotherapy.      Antonietta Breach, M.D.  Electronically Signed     JW/MEDQ  D:  07/21/2008  T:  07/21/2008  Job:  [id number]

## 2011-03-19 NOTE — Discharge Summary (Signed)
[name], DANTUONO NO.:  [id number]   MEDICAL RECORD NO.:  [id number]          PATIENT TYPE:  INP   LOCATION:  1505                         FACILITY:  [facility]   PHYSICIAN:  Jamison Neighbor, M.D.  DATE OF BIRTH:  1946/07/14   DATE OF ADMISSION:  11/20/2007  DATE OF DISCHARGE:  11/25/2007                               DISCHARGE SUMMARY   DISCHARGE DIAGNOSES:  1. Pyelonephritis.  2. Gross hematuria.  3. Diabetes.  4. Hypertension.  5. Fibromyalgia.  6. Depression.   HISTORY:  This 65 year old female has had problems with chronic  cystitis.  The patient presented to the office at which point she was  noted to have out-of-control diabetes and it was thought that she might  require hospitalization for the management of her gross hematuria.  The  patient was in the process undergoing evaluation for chronic cystitis  and hematuria.  Cystoscopic examination did not show any tumors or  stones as a source for her persistent hematuria.  Recent scan showed  normal upper tracts.  The patient was admitted because her blood sugar  has been in the 400-500 range and has not been well handled.   The patient was seen in evaluation by the hospitalist service who were  helpful in the management of her diabetes problem and made some  recommendations for getting her insulin under better control.  The  patient received antibiotic therapy with Cipro and gentamicin during her  hospital stay and she did stabilize.  The patient no longer had any  hematuria.  The patient's urine cultures came back negative though we  note that she had been on p.o. antibiotics prior to hospitalization and  her sugars came under little bit better control.  Her hemoglobin A1c was  elevated at 10.8, indicating that she has had suboptimal control.  The  patient is ready for discharge by January 21.  We had placed her on  Estrace for some vaginal problems and recommended that she be seen by  Dr. Farris Has for  follow-up for her diabetes.  The patient will finish her  current course of antibiotics and will have follow-up urine cultures in  our office.      Jamison Neighbor, M.D.  Electronically Signed     RJE/MEDQ  D:  12/18/2007  T:  12/20/2007  Job:  [id number]   cc:   Estell Harpin, M.D.  Fax: [phone]

## 2011-03-19 NOTE — Discharge Summary (Signed)
[name], [name] NO.:  [id number]   MEDICAL RECORD NO.:  [id number]          PATIENT TYPE:  INP   LOCATION:  1506                         FACILITY:  [facility]   PHYSICIAN:  Lucita Ferrara, MD         DATE OF BIRTH:  Apr 22, 1946   DATE OF ADMISSION:  12/24/2007  DATE OF DISCHARGE:  12/27/2007                               DISCHARGE SUMMARY   ADMISSION DIAGNOSES:  1. Multiple skin abscesses and cellulitis presumed to be community-      acquired methicillin-resistant Staphylococcus aureus.  2. Type 2 diabetes, chronic, stable.  3. Fibromyalgia, chronic, stable.  4. Wide-spread osteoarthritis, chronic, stable.  5. Depression with a history of suicide attempt in the past.  6. Recurrent cystitis, chronic, stable.   DISCHARGE DIAGNOSIS:  Skin abscesses found to be methicillin-resistant  Staph aureus, sensitive to vancomycin, sensitive to tetracycline,  consistent with community-acquired methicillin-resistant Staphylococcus  aureus.   CONSULTANT:  Dr. Abbey Chatters from [facility].   BRIEF HISTORY OF PRESENT ILLNESS:  Ms. Drummonds is a 65 year old female  with uncontrolled diabetes, who presented to [facility]  Emergency Room with a chief complaint of worsening skin abscesses, not  amenable to treatment on outpatient antibiotics.   HOSPITAL COURSE:  PROBLEM #1 - WORSENING SKIN ABSCESSES:  The patient  was admitted to the general medical floor.  Surgery was consulted to see  if there were any areas that could be an I&D'd.  Areas were cultured and  it was thought that these areas were superficial and spontaneously  draining.  It was also their thought that this is most likely community-  acquired MRSA and that this required no further incision and drainage at  this time.  The patient was started on IV vancomycin empirically; in  addition, given that this was presumed MRSA, the patient was started on  a regimen of decontamination with  chlorhexidine body watch.  Upon  institution of the above, the patient's symptoms improved.  Today, the  patient will be going home on p.o. doxycycline 100 mg p.o. b.i.d. for 3  weeks per Surgery's recommendations.   PROBLEM #2 - TYPE 2 DIABETES:  Given that the patient has a history of  diabetes, hemoglobin A1c was ordered to see her long-term control, which  is not good, given that she has a hemoglobin A1c of 9; for this,  diabetic teaching was requested.  The patient was continued on metformin  1000 mg p.o. b.i.d., glipizide per home doses, but the patient's Lantus  was increased to 55 units subcu at bedtime.  The patient was instructed  to follow up in regards to her diabetic control in an outpatient  setting, perhaps increasing her Lantus by 5 units per night on a  titration basis until she reaches more desired blood sugars. She is  advised to check and record blood sugars frequenly and log for her PMD.   PROBLEM #3 - FIBROMYALGIA:  The patient was restarted on her home  medications including Xanax 1 mg p.o. t.i.d., Lexapro 10 mg p.o. daily  and Ambien 10 mg  p.o. nightly.   DISCHARGE LEVEL OF ACTIVITY:  No restrictions, patient advised to  increase activity slowly.   DIET:  The patient is recommended to continue a diabetic, carb-  controlled diet.   FOLLOWUP:  The patient is recommended to follow up with her outpatient  primary care doctor in 1 week and per surgical note, it is recommended  the patient follow up in a surgery office as a p.r.n. basis if her  multiple superficial skin abscesses do not resolve.   DISCHARGE VITALS:  Temperature 97.6, pulse 61, respirations 18, blood  pressure 113/71, pulse oximetry 92% on room air.   DISCHARGE LABORATORY DATA:  Complete metabolic panel within normal  limits.  CBC:  White count 7.4, hemoglobin 10.6, hematocrit 31.1,  platelets 186,000.      Lucita Ferrara, MD  Electronically Signed     RR/MEDQ  D:  12/27/2007  T:  12/27/2007   Job:  [id number]

## 2011-03-19 NOTE — Discharge Summary (Signed)
[name], HERSH NO.:  [id number]   MEDICAL RECORD NO.:  [id number]           PATIENT TYPE:   LOCATION:                                 FACILITY:   PHYSICIAN:  Lonia Blood, M.D.       DATE OF BIRTH:  [birth year]   DATE OF ADMISSION:  05/16/2008  DATE OF DISCHARGE:                               DISCHARGE SUMMARY   PRIMARY CARE PHYSICIAN:  Dr. Farris Has.   DISCHARGE DIAGNOSES:  1. Escherichia coli urinary tract infection.  2. Severe weakness to the point where the patient was unable to get      out of bed.  3. Severe anxiety and depression.  4. Diabetes mellitus type 2 controlled.  5. Chronic pain syndrome.  6. Morbid obesity.  7. Osteoarthritis.  8. Hypertension.  9. History of methicillin-resistant Staphylococcus aureus skin      abscesses.   Discharge medication and disposition will be dictated at the time of the  actual discharge of this patient.   CONSULTATION:  This admission, no consultations obtained.   PROCEDURE:  No procedures are done.   HISTORY AND PHYSICAL:  Refer to dictated H and P done by Dr. Della Goo on May 15, 2008.   HOSPITAL COURSE:  1. Weakness.  [name]  presented to the [facility] emergency room      on May 15, 2008, complaining of generalized weakness.  She had a      full investigation with complete metabolic profile, lactic acid      level, vitamin B12 level, TSH that did not reveal any metabolic      cause for her weakness.  The patient's neurological exam was      nonfocal so we did not suspect a stroke.  The patient was seen by      the physical therapist and it was deemed that she could benefit      from a rehabilitation short-term in a skilled nursing facility to      get her stronger.  2. Escherichia coli urinary tract infection.  This could explain the      patient's weakness.  [name] was treated with intravenous      Rocephin and her urine cultures final results are pending..  3. Diabetes  mellitus type 2.  This seems to be adequately controlled.      The patient will continue her Lantus sliding scale insulin, Actos      and metformin and she can follow up with her primary      gastroenterologist, Dr. Dagoberto Ligas.  4. Anxiety and depression.  This seems to be quite severe.  In the      same that [name] is on Celexa, Abilify, and Xanax and we did      continue this medication without changes.  5. Chronic pain syndrome we have optimized the patient treatment by      adding a long-term opiate in the form of OxyContin 10 mg twice a      day.  This seems to be providing adequate relief at this point in  time, together with Percocet for breakthrough symptoms.      Lonia Blood, M.D.  Electronically Signed     SL/MEDQ  D:  05/17/2008  T:  05/17/2008  Job:  [id number]

## 2011-03-19 NOTE — Consult Note (Signed)
[name], GREGA NO.:  [id number]   MEDICAL RECORD NO.:  [id number]          PATIENT TYPE:  INP   LOCATION:  1505                         FACILITY:  [facility]   PHYSICIAN:  Hillery Aldo, M.D.   DATE OF BIRTH:  02/27/1946   DATE OF CONSULTATION:  11/20/2007  DATE OF DISCHARGE:                                 CONSULTATION   PRIMARY CARE PHYSICIAN:  Dr. Farris Has.   CONSULTANT:  Dr. Marcelyn Bruins.   REASON FOR CONSULTATION:  Diabetes management.   HISTORY OF PRESENT ILLNESS:  The patient is a 65 year old female here  with pyelonephritis admitted by Dr. Logan Bores earlier today.  Apparently,  the patient had been to see Dr. Farris Has almost 2 weeks ago who put her on  a 10-day course of ciprofloxacin for symptoms of urinary tract  infection.  By finishing the course of antibiotics, the patient  continued to complain of hematuria and lower abdominal pain.  She was  ultimately referred to Dr. Logan Bores who performed a CAT scan of her abdomen  and pelvis which was reportedly normal according to the patient.  Over  the past 24 hours, the patient has become increasingly nauseated and has  experienced worsening abdominal pain, and she therefore re-presented to  the office for evaluation and was admitted for IV antibiotic therapy by  Dr. Logan Bores.  We are asked to help control her diabetes which has  reportedly been out of control.  The patient does check her glucoses at  home and has reported that her sugars were never under 200.  She does  know if she has her hemoglobin A1c values checked regularly.  Her sugars  lately have been up in the 400s.   PAST MEDICAL HISTORY:  1. Hypertension.  2. Diabetes.  3. Degenerative joint disease.  4. Fibromyalgia.  5. Diabetic neuropathy.  6. Depression/anxiety.  7. Status post catheterization of the heart which was reportedly      normal.   PAST SURGICAL HISTORY:  1. Hysterectomy.  2. Cholecystectomy.  3. Appendectomy.  4.  Tonsillectomy.  5. Knee replacement x2.  6. Multiple foot surgeries.   CURRENT MEDICATIONS:  1. Lantus insulin 20 units subcutaneously daily.  2. Metformin 500 mg b.i.d.  3. Metoprolol XL 25 mg daily.  4. Lexapro 30 mg daily.  5. Ambien 10 mg every night.  6. Librium 25 mg t.i.d. p.r.n.  7. Glipizide 5 mg daily.  8. Xanax 1 mg p.o. t.i.d.   ALLERGIES:  DIAZEPAM.   SOCIAL HISTORY:  The patient is widowed.  She lives alone in a  retirement center.  She is a lifelong nonsmoker.  She denies any alcohol  use.  She was retired from a [employer] where she had worked for 18  years but ultimately became disabled.   FAMILY HISTORY:  The patient's mother died at 2 from stomach cancer.  The patient's father died at 29 from lung cancer.  He also had  alcoholism.  She has one healthy brother.  She has two sisters, one who  has kidney cancer.  The other is healthy.  She has  3 children.  Once of  her children has alcoholism.   REVIEW OF SYSTEMS:  The patient denies any fever but reports some  chills.  Appetite has been within normal limits.  She has not  experienced any rapid weight changes but reports steady gain over time.  She denies any chest pain but has had some palpitations.  She  occasionally suffers with dyspnea on exertion.  She does not get short  of breath at rest.  She denies cough.  She denies changes in her bowel  habits, melena or hematochezia.  Otherwise as noted in the HPI above.   PHYSICAL EXAMINATION:  VITAL SIGNS:  Blood pressure 154/94, pulse 85,  respirations 18, O2 saturation 99% on room air.  GENERAL:  An obese female in no acute distress.  HEENT:  Normocephalic, atraumatic.  PERRL.  EOMI.  Oropharynx clear.  NECK:  Supple, no thyromegaly, no lymphadenopathy, no jugular venous  distension.  CHEST:  Lungs clear to auscultation bilaterally with good air movement.  HEART:  Regular rate, rhythm.  No murmurs, rubs or gallops.  ABDOMEN:  Soft, nontender, nondistended  with normoactive bowel sounds.  EXTREMITIES:  No clubbing, edema, or cyanosis.   DATA REVIEW:  Sodium is 134, potassium 4.3, chloride 97, bicarb 27, BUN  13, creatinine 0.70, glucose 199.  Liver function studies are within  normal limits.  White blood cell count is 6.8, hemoglobin 13.1,  hematocrit 38.3, platelets 170.   ASSESSMENT AND PLAN:  1. Uncontrolled diabetes:  The patient has been resumed on her usual      dose of Lantus.  At this point, I will start her on the insulin      resistant sliding scale of NovoLog and if her sugars remain      controlled add meal coverage.  I will check her hemoglobin A1c to      determine her overall glycemic control.  2. Hypertension:  The patient's blood pressure currently is      suboptimally controlled.  Will monitor this an additional 24 hours,      and if it remains elevated, would consider adding a second agent or      increasing her metoprolol.      Hillery Aldo, M.D.  Electronically Signed     CR/MEDQ  D:  11/20/2007  T:  11/21/2007  Job:  [id number]   cc:   Jamison Neighbor, M.D.  Fax: [phone]

## 2011-03-19 NOTE — H&P (Signed)
NAMEAMBRA, [name] NO.:  [id number]   MEDICAL RECORD NO.:  [id number]          PATIENT TYPE:  OBV   LOCATION:  1301                         FACILITY:  [facility]   PHYSICIAN:  Della Goo, M.D. DATE OF BIRTH:  [birth year]   DATE OF ADMISSION:  05/15/2008  DATE OF DISCHARGE:                              HISTORY & PHYSICAL   PRIMARY CARE PHYSICIAN:  Unassigned; Estell Harpin, M.D., family  practice.   CHIEF COMPLAINT:  Weakness.   HISTORY OF PRESENT ILLNESS:  This is a 65 year old female who presents  to the emergency department with complaints of severe weakness and  difficulty walking.  She reports her symptoms have been worsening over  the past 3 days.  She denies having any fevers or chills.  She does  report having increased tremors that have been occurring off and on for  the past 3 weeks.  She also reports muscle spasms..  The patient reports  being unable to get around in her home; lives alone and is unable to go  back home.  The patient denies having any chest pain, shortness of  breath, syncope, seizures, nausea, vomiting or diarrhea.  She does have  chronic pain, muscle aches and a diagnosis of fibromyalgia.   PAST MEDICAL HISTORY:  1. Fibromyalgia.  2. Type 2 diabetes mellitus.  3. Hypertension.  4. Osteoarthritis.  5. Morbid obesity.  6. Depression.  7. Recurrent cystitis.  8. Last hospitalization February 19 until December 27, 2007 for      community MRSA infection of the skin.   MEDICATIONS:  1. Lantus insulin 45 units subcutaneously nightly.  2. Januvia 100 mg p.o. daily.  3. Metformin 1000 mg one p.o. b.i.d.  4. Celexa 40 mg one p.o. daily.  5. Actos 45 mg one p.o. daily.  6. Abilify 20 mg one p.o. nightly.  7. Alprazolam 1 mg p.o. t.i.d.  8. Oxycodone 10/325 mg one p.o. q. 4-6 h. p.r.n.  9. Ambien 10 mg p.o. nightly.  10.Chlordiazepoxide 25 mg p.o. t.i.d.   ALLERGIES:  The patient reports NO KNOWN DRUG ALLERGIES.  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   SOCIAL HISTORY:  The patient lives alone.  She is a nonsmoker,  nondrinker.  No history of illicit drug usage.   FAMILY HISTORY:  Noncontributory.   REVIEW OF SYSTEMS:  Pertinents are mentioned above.   PHYSICAL EXAMINATION FINDINGS:  This is a morbidly obese 65 year old  female in discomfort but no acute distress.  VITAL SIGNS:  Temperature 99.3, blood pressure 132/81, heart rate 84-  109, respirations 18-24,  oxygen saturation 96% on room air.  HEENT:  Examination normocephalic, atraumatic.  There is no scleral  icterus.  Pupils are equally round and reactive to light.  Extraocular  movements are intact.  Funduscopic benign.  Oropharynx is clear.  NECK:  Supple; full range of motion.  No thyromegaly, adenopathy or  jugular venous distention.  CARDIOVASCULAR:  Mild tachycardia.  No  murmurs, gallops or rubs.  LUNGS:  Clear to auscultation bilaterally.  ABDOMEN:  Positive bowel sounds; soft, nontender, nondistended.  EXTREMITIES:  Without cyanosis, clubbing or edema.  NEUROLOGIC:  Examination nonfocal.   LABORATORY STUDIES:  White blood cell count 9.8, hemoglobin 13.8,  hematocrit 41.1, platelets 180, neutrophils 64%, lymphocytes 29%.  Sodium 139, potassium 4.2, chloride 103, bicarb 25, BUN 19, creatinine  0.77, glucose 83, albumin level 4.0.  Urinalysis:  Positive nitrites and  small leukocyte esterase, creatinine kinase level 75.  Chest x-ray  reveals no acute disease process.   ASSESSMENT:  A 65 year old female being admitted with:  1. Generalized weakness.  2. Muscle spasms/tremors.  3. Urinary tract infection.  4. Chronic pain syndrome, fibromyalgia.  5. Type 2 diabetes mellitus.  6. Hypertension.  7. Morbid obesity.  8. History of anxiety and depression.   PLAN:  The patient will be admitted for 24-hour observation status.  The  patient will be placed on antibiotic therapy of  ciprofloxacin for her  urinary tract infection.  Pending results of the urine culture, this  antibiotic therapy will be further adjusted.  Her regular medications  have been verified and they will be continued.  Sliding-scale insulin  coverage has been ordered as well.  A lactic acid level will be checked  secondary to the patient's medications for diabetes, the metformin  especially.  Pending abnormal and elevated results, the metformin  therapy will be  discontinued.  Sliding-scale insulin coverage has been ordered as needed  as well.  The patient will be placed on DVT and GI prophylaxis.  A case  management evaluation has been requested to evaluate the patient for  possible nursing home, assisted living or rest home placement versus a  rehabilitation therapy placement.      Della Goo, M.D.  Electronically Signed     HJ/MEDQ  D:  05/15/2008  T:  05/15/2008  Job:  [id number]   cc:   Estell Harpin, M.D.  Fax: [phone]

## 2011-03-22 NOTE — H&P (Signed)
[name]. [facility]  Patient:    [name], [name]               MRN: [id number] Adm. Date:  [id number] Attending:  Sela Hua CC:         Timothy P. Fontaine, M.D.   History and Physical  [name] is a 65 year old female who had the onset of nausea, vomiting, and chest pressure approximately 8 p.m. last night.  It was like a hot iron on her chest.  She really thought the symptoms all came on the same time and really was not clear that the chest pain started after the vomiting.  She has had no nausea, vomiting, diarrhea, or fever otherwise.  She was diaphoretic, cold, very weak, and felt like she passed out.  She awoke this morning at 7:30 still weak, nauseated, and having occasional twinges of chest pain.  Presented to the emergency room.  She has minimal cardiac risk factors except for the fact that she was obese (over 300 pounds) for several years until about 7-10 years ago.  She has had a history of hypercholesterolemia.  There is no history of diabetes and negative family history.  PAST MEDICAL HISTORY:  Remarkable for anxiety, history of bilateral foot surgery several times in the past.  She has had apparently hysterectomy and oophorectomy and a history of appendicitis and tonsillectomy.  She has had hypercholesterolemia.  Has a history of anemia and history of obesity.  ALLERGIES:  None.  MEDICATIONS:  Vicodin for foot pain, Xanax b.i.d., Serzone antidepressant.  FAMILY HISTORY:  Father died of cancer.  Mother died of cancer.  No heart disease among the siblings.  SOCIAL HISTORY:  She is widowed for 14 years.  Three children.  Lives with her sister.  No smoking.  No alcohol.  Employed as a Location manager.  Works long, stressful hours.  REVIEW OF SYSTEMS:  Two weeks ago she had palpitations and hot feeling, treated with muscle relaxants.  There has been no source of GI blood loss. She has generalized arthritis and has chronic  anxiety.  Other review of systems is negative.  PHYSICAL EXAMINATION  VITAL SIGNS:  Blood pressure 123/74, heart rate 98, respiratory rate 18.  SKIN:  Warm and dry.  Color is normal.  HEENT:  Negative.  LUNGS:  Clear.  HEART:  Regular rate and rhythm.  ABDOMEN:  Negative, but obese.  RECTAL:  Deferred.  EXTREMITIES:  Full, but no edema.  There is no deformity.  LABORATORIES:  Hematocrit 40.  BUN 29, glucose 115.  EKG shows nonspecific ST changes with diminutive Q-waves inferiorly and poor R-wave progression anteriorly.  OVERALL IMPRESSION: 1. Atypical chest pain associated with nausea and vomiting, rule out    myocardial infarction. 2. Anxiety. 3. Hypercholesterolemia.  PLAN:  Will check laboratories.  Will probably need to proceed on with cardiac catheterization.  With her history of anxiety as well as her history of obesity, I do not think stress testing would be appropriate and imaging would be fought with problems.  Will proceed on with cardiac catheterization. Procedure risks and benefits have been explained.  The patient is willing to proceed. DD:  03/03/01 TD:  03/03/01 Job: [id number]

## 2011-03-22 NOTE — Discharge Summary (Signed)
[place]. [facility]  Patient:    CECILEY, [name]               MRN: [id number] Adm. Date:  [id number] Disc. Date: [id number] Attending:  Eleanora Neighbor Dictator:   Jennet Maduro. Earl Gala, R.N., A.N.P. CC:         Estell Harpin, M.D.  Timothy P. Fontaine, M.D.   Discharge Summary  PRIMARY DISCHARGE DIAGNOSES: 1. Atypical chest pain with subsequent cardiac catheterization revealing    normal coronary arteries, normal left ventricular function. 2. Hypercholesterolemia, with current lipid panel showing total cholesterol    216, triglycerides 131, HDL 89, LDL of 101. 3. Chronic anxiety.  HISTORY OF PRESENT ILLNESS:  [name] is a 65 year old female who had the onset of nausea, vomiting, and chest pressure on the evening prior to admission.  She described the feeling as "a hot iron" on her chest.  She felt very weak and washed out and felt like she was going to pass out.  She did not seek medical attention until the following morning.  She was subsequently admitted for further evaluation.  Please see the dictated history and physical per Dr. Roger Shelter for further patient presentation and profile.  ADMISSION LABORATORY DATA:  EKG showed nonspecific ST changes with poor R-wave progression anteriorly.  There were diminutive Q-waves inferiorly.  Hematocrit 36, white count was 3.3, platelets 167.  TSH was normal. Hemoglobin A1C was normal at 5.2.  Chemistries revealed sodium of 139, potassium 3.8, chloride 105, CO2 28, BUN 30, creatinine 0.7, and a glucose of 116.  Chest x-ray showed no radiographic evidence of an acute cardiopulmonary process.  HOSPITAL COURSE:  The patient was admitted.  She ruled out negative for myocardial infarction per serial enzymes.  We proceeded on with cardiac catheterization to further discern the etiology of her chest pain.  This procedure was performed on Mar 04, 2001, without any known complications. Please  see the dictated report for full details.  The coronaries were noted to be normal.  Left ventricular function was noted to be normal.  Today on Mar 05, 2001, she is fairly well, without no complaints.  Blood pressure has stabilized at 110/80.  Rhythm has remained stable.  She will have plans for a gallbladder ultrasound as well as upper GI prior to discharge this morning.  CONDITION ON DISCHARGE:  Stable.  DISCHARGE MEDICATIONS:  She may resume her home medicines.  Will admitted Protonix 40 mg a day.  ACTIVITY:  As tolerated.  DIET:  Low fat.  WOUND CARE:  She is to apply a Band-Aid with Neosporin daily to the puncture site for the next two days.  DISCHARGE INSTRUCTIONS:  She will call Dr. Deborah Chalk for any problems with the groin and will keep her regularly scheduled appointments with her other physicians. DD:  03/05/01 TD:  03/06/01 Job: [id number]

## 2011-03-22 NOTE — Consult Note (Signed)
[name], [name] NO.:  [id number]   MEDICAL RECORD NO.:  [id number]          PATIENT TYPE:  INP   LOCATION:  5022                         FACILITY:  [facility]   PHYSICIAN:  Petra Kuba, M.D.    DATE OF BIRTH:  [birth year]   DATE OF CONSULTATION:  DATE OF DISCHARGE:                                   CONSULTATION   HISTORY:  Patient is seen at the request of Eudelia Bunch for right-sided  abdominal pain.  His PA was concerned because of a preop white count of 12  which has since dropped to 10.  The patient is currently not having any pain  other than her chronic right-sided pain which she has had for years, does  not remember why it started, not related to her bowels or eating.  She had  multiple operations in the past but no previous colon screening.  Dr. Logan Bores  has done scans on her in the office, but probably the last one was  __________  .  She does have blood in her urine that Dr. Logan Bores follows her  for but no blood in her stool.  She will have some cramps and spasms  periodically from her bowels but no real change in her bowel habits.  Her  past medical history is pertinent for the left knee replacement, I think  this is her second one.  She also has noninsulin dependent diabetes.  History of depression, anxiety in the past, tonsillectomy, cholecystectomy,  hysterectomy, right knee replacement as well as chronic pain, and history of  fibromyalgia.  Medicines at home include Detrol, nitrofurantoin, Lexapro,  Ambien, metformin, Toprol, Xanax, Abilify, Advil, Prilosec, and Mucinex.   ALLERGIES:  None.   SOCIAL HISTORY:  Does not drink or smoke.   FAMILY HISTORY:  Pertinent for her mother with stomach cancer, she is pretty  sure it started in the stomach, but spread elsewhere.   REVIEW OF SYSTEMS:  Negative except above, doing well postop.   PHYSICAL EXAM:  No acute distress; lying comfortably in the bed.  Exam is  pertinent for her abdomen being soft,  nontender, good bowel sounds.   LABORATORY DATA:  Labs pertinent for a hemoglobin drop probably from her  surgery, white count from 10-12.  Preop chemistry is normal except for a  mildly elevated sugar and an SGPT of 42 with a normal SGOT, alk phos, and  bilirubin.  Cholesterol was 313, triglycerides 692, MCV 94, normal white  count, hemoglobin, and platelet count preop until the preop of 12.   ASSESSMENT:  1. Multiple previous surgeries with chronic right-sided pain.  2. Hematuria followed by Dr. Logan Bores.  3. Some episodic cramps and spasms probably adhesionally.  4. Well overdue for a colonic screening.   PLAN:  If there is concern about her belly pain, we would probably get a CAT  scan just to make sure nothing big or bad.  We will leave that to Dr.  Sherene Sires team.  I do not see any indication for it right now.  When I told  her when she  is  over her surgery and off the Coumadin, probably after the holidays, come  see me to schedule a screening colonoscopy which she needs.  I gave her my  card.  Please call if I can be of any help during the hospitalization or any  other question or problems.           ______________________________  Petra Kuba, M.D.     MEM/MEDQ  D:  09/18/2006  T:  09/19/2006  Job:  [id number]   cc:   Molly Maduro A. Thurston Hole, M.D.  Jamison Neighbor, M.D.

## 2011-03-22 NOTE — Op Note (Signed)
NAMEDARIANN, [name]                         ACCOUNT NO.:  [id number]   MEDICAL RECORD NO.:  [id number]                   PATIENT TYPE:  INP   LOCATION:  2899                                 FACILITY:  [facility]   PHYSICIAN:  Elana Alm. Thurston Hole, M.D.              DATE OF BIRTH:  02/24/1946   DATE OF PROCEDURE:  06/14/2003  DATE OF DISCHARGE:                                 OPERATIVE REPORT   PREOPERATIVE DIAGNOSIS:  Right knee degenerative joint disease.   POSTOPERATIVE DIAGNOSIS:  Right knee degenerative joint disease.   PROCEDURE:  Right total knee replacement using Osteonic Scorpio Total Knee  System with #7 cemented femoral component, #7 cemented tibial component with  12-mm polyethylene flex tibial spacer and 26-mm polyethylene cemented  patella.   SURGEON:  Elana Alm. Thurston Hole, M.D.   ASSISTANT:  Julien Girt, P.A.   ANESTHESIA:  General.   OPERATIVE TIME:  1 hour 20 minutes.   COMPLICATIONS:  None.   DESCRIPTION OF PROCEDURE:  Ms. Steidle was brought to the operating room on  June 14, 2003, placed on the operative table in the supine position.  After an adequate level of general anesthesia was obtained, her right knee  was examined under anesthesia.  Range of motion from -5 to 120 degrees mild  valgus deformity.  Knee stable.  Patella ligamentous exam with normal  patellar tracking.  She received Ancef 2 g IV preoperatively for  prophylaxis.  She had a Foley catheter placed under sterile conditions.  Her  right leg was then prepped using sterile DuraPrep and draped using sterile  technique.  Leg was exsanguinated and a thigh tourniquet elevated to 375 mm.  Initially, through a 15 cm longitudinal incision based over the patella,  initial exposure was made.  The underlying subcutaneous tissues were incised  along with the skin incision.  A median arthrotomy is performed revealing an  excessive amount of normal-appearing joint fluid.  The articular surfaces  were  inspected.  She had grade 3 changes medially, grade 3 and 4 changes  laterally, and grade 3 and 4 changes in the patellofemoral joint.  The  medial lateral meniscal remnants were removed, as well as the osteophytes  off the femoral condyles and tibial plateau.  An intermedullary drill was  then drilled up the femoral canal for placement of the distal femoral  cutting jig, which was placed in the appropriate amount of rotation and a  distal 12 mm cut was made.  The distal femur was then sized; a #7 was found  to be the appropriate size, the #7 cutting jig placed, and the knee cuts  were made.  Proximal tibial was then exposed.  The tibial spines were  removed with an osculating saw.  Intermedullary drill, drilled down the  tibial canal for placement of a proximal tibial cutting jig, which was  placed in the appropriate amount of rotation and a 6  mm proximal cut was  made.  After then was done, the Scorpio TCL cutter was placed back on the  distal femur and these cuts were made.  At this point, the #7 femoral trial  was placed.  The #7 tibial base plate trial was placed and with a 12 mm  polyethylene spacer, there was found to be excellent restoration of normal  alignment, excellent stability, range of motion 0 to 120 degrees with no  lift up on the tray.  Tibial base plate was then marked for rotation and  keel cut was made.  After this was done, the patella was sized; 26 mm was  found to be the appropriate size and a recess 10 mm x 26 mm cut was made and  3 locking holes were placed.  After this was done, it was felt that all the  trial components were of excellent size, fit, and stability.  They were then  removed.  The knee was then jet lavage irrigated with 3 liters of saline  solution.  The proximal tibia was then exposed.  The #7 tibial base plate  with cement backing was then hammered into position with an excellent fit  with excess cement being removed from around the edges.  The #7  femoral  component with cement backing was hammered into position, also with an  excellent fit with excess cement being removed from around the edges.  The  12 mm polyethylene flex tibial spacer was then locked on the tibial base  plate.  The knee was taken through a range of motions, 0 to 120 degrees with  excellent stability and lifts off on the tray.  The 26 mm cement back  polyethylene patella was then locked into its recessed hole and held there  with a clamp.  After the cement hardened, patellofemoral tracking was  evaluated and this was found to be normal.  At this point, then the knee was  further irrigated with antibiotic solution.  The arthrotomy was then closed  with #1 Ethibond suture over 2 medium Hemovac drains.  Subcutaneous tissue  is closed with 0-2-0 Vicryl; skin closed with skin staples, sterile  dressings were applied.  The Hemovac injected with 0.25% Marcaine with  epinephrine and 4 mg of morphine and clamped.  Sterile dressings were  applied and the tourniquet was released.  Patient then had a femoral nerve  block placed by anesthesia for postoperative pain control.  She was then  awakened, extubated, and taken to the recovery room in stable condition.  Needle and sponge count was correct x 2 at the end of the case.                                               Robert A. Thurston Hole, M.D.    RAW/MEDQ  D:  06/14/2003  T:  06/14/2003  Job:  [id number]

## 2011-03-22 NOTE — Discharge Summary (Signed)
NAMESACRED, ROA NO.:  [id number]   MEDICAL RECORD NO.:  [id number]          PATIENT TYPE:  INP   LOCATION:  5022                         FACILITY:  [facility]   PHYSICIAN:  Robert A. Thurston Hole, M.D. DATE OF BIRTH:  April 17, 1946   DATE OF ADMISSION:  09/17/2006  DATE OF DISCHARGE:  09/22/2006                               DISCHARGE SUMMARY   ADMISSION DIAGNOSES:  1. End-stage degenerative joint disease, left knee.  2. Diabetes.  3. Hypertension.  4. Renal insufficiency.  5. Depression.  6. Fibromyalgia.  7. Symptomatic lumbar spondylosis.   DISCHARGE DIAGNOSES:  1. End-stage degenerative joint disease, left knee, status post left      total knee replacement on September 17, 2006.  2. Diabetes, type 2.  Capillary blood glucoses in the hospital have      been between 205 and 280.  3. Hypertension.  4. Clinical depression.  5. Fibromyalgia.  6. Symptomatic lumbar spondylosis.  7. Status post right total knee replacement, remote.   HISTORY OF PRESENT ILLNESS:  The patient is a 65 year old white female  with a history of end-stage DJD of both knees.  She underwent a right  total knee replacement in 2004 and has done well with this.  Now she has  end-stage DJD of her left knee that has failed conservative care,  including anti-inflammatories and articular cortisone injections and  rest.  At this point in time, she requires narcotic medication for pain  control.  She understands the risks, benefits, and possible  complications of a left total knee replacement and is without question.   PROCEDURE:  On September 17, 2006, the patient underwent a left total  knee replacement by Dr. Thurston Hole and a left femoral nerve block by  anesthesia.  She tolerated both procedures well and was admitted  postoperatively for pain control, DVT prophylaxis, and physical therapy.   HOSPITAL COURSE:  On postoperative day #0, she was placed on a CPM, 0-90  degrees for 8 hours.  She was  started on Lovenox and Coumadin for DVT  prophylaxis.   On postoperative day #1, she was alert and oriented.  She did have some  difficulty with nausea.  Her Dilaudid PCA was discontinued.  She had a  cath UA done intraoperatively which came back completely clear.  Her  hemoglobin was 10.8.  Her CBGs were 235.  Her renal function was  significantly improved, with a creatinine of 0.7.  She had moderate  drainage from her wound, and she complained of significant right lower  quadrant tenderness.   On postoperative day #2, the patient's nausea was improved.  T max was  100.7.  Hemoglobin was 10.4.  Her INR was 1.8.  CBG was 239.  CPM was 0-  70.  The surgical wound was well-approximated.  She was slightly  hyponatremic at 128.  BUN was 2, creatinine was 0.5.  Her IV fluids were  stopped because her INR was 1.8.  Her Lovenox was stopped.  Coumadin was  continued.  Rehabilitation was consulted; however, she was not a  candidate for inpatient rehabilitation, and they  recommended skilled  nursing to be her discharge planning destination.   A diabetes treatment program was consulted.  They recommended increasing  her Lantus to 20 units at night and changing her sliding scale to  resistant.   GI was consulted due to her right lower quadrant pain.  A CT scan was  done of her abdomen and pelvis with contrast.  This was negative.  Due  to the CT with contrast, her metformin was held for 48 hours.   On postoperative day #3, the patient was able to void.  She had no  nausea, no vomiting, no shortness of breath.  Her abdominal pain had  improved.  Her hemoglobin was 9.1.  Her renal function remained stable,  with a BUN of 6 and a creatinine of 0.6.  24 hours after her CT with  contrast, her INR was 2.5.  Her CBGs were between 242 and 301.  Her  wound was well-approximated, with no drainage.  Her calf was soft with a  negative Homan's.  She was on her CPM 0-80 degrees.   On postoperative day #4,  she ambulated with physical therapy 200 feet  with supervision.  She was able to get in and out of bed with  supervision as long as she was wearing her knee immobilizer.  Her  metformin was restarted, and she still continued to have CBGs greater  than 200.  On postoperative day #4, her renal function still remained  exceptional, with a BUN of 4 and creatinine of 0.5.  Her hemoglobin was  8.8, and she was completely asymptomatic with her postoperative blood  loss anemia.  Her INR was 3.3.  She did have some ecchymosis about her  knee.  The surgical wound was well-approximated, and she had no  drainage.   On postoperative day #5, due to her elevated CBGs between 205 and 275,  glipizide 5 mg p.o. b.i.d. was added to her metformin 1000 mg p.o.  b.i.d., and her Lantus 20 units at night.  She is being discharged to a  skilled nursing facility today, weightbearing as tolerated, on a  diabetic, carbohydrate modified medium diet with 60 g of carbohydrates.  She will need physical therapy daily.   DISCHARGE MEDICATIONS:  1. Coumadin per pharmacy protocol.  Her most recent dose was September 21, 2006, 0.5 mg p.o.  On September 20, 2006, her Coumadin dose was      2.5.  2. Colace 100 mg 1 p.o. b.i.d.  3. NovoLog sliding scale.  CBGs 101-150, 3 units of NovoLog; 151-200,      4 units of NovoLog; 201-250, 7 units of NovoLog; 251-300, 9 units      of NovoLog; 301-350, 12 units of NovoLog; CBGs greater than 350, 15      units of NovoLog; CBGs greater than 400, call medical doctor.  4. Macrobid 100 mg 1 tablet p.o. daily for UTI prophylaxis per Dr.      Logan Bores.  5. Lexapro 30 mg daily.  6. Ambien 10 mg p.o. q.h.s.  7. Toprol-XL 25 mg daily.  8. Xanax 1 mg p.o. t.i.d. scheduled.  9. Abilify 20 mg p.o. q.h.s.  10.Lantus 20 units injected q.h.s.  11.Glucophage 1000 mg p.o. b.i.d.  12.Glipizide 5 mg p.o. b.i.d. 13.Oxycodone 5 mg 1 to 2 tablets q.3h. p.r.n. pain.  14.Robaxin 500 mg 1 q.6h. p.r.n.  muscle spasm.   DISCHARGE INSTRUCTIONS:  She is to get physical therapy daily.  She is  weightbearing as tolerated.   FOLLOW UP:  She is to follow up with Dr. Thurston Hole on September 30, 2006.  Please call our office, [phone], to schedule this followup appointment.  Please also call our office if she has increased pain, increased  redness, increased swelling, increased drainage, or a temperature  greater than 101.      Kirstin Shepperson, P.A.      Robert A. Thurston Hole, M.D.  Electronically Signed    KS/MEDQ  D:  09/22/2006  T:  09/22/2006  Job:  [id number]

## 2011-03-22 NOTE — Discharge Summary (Signed)
NAMEARRIEL, VICTOR                         ACCOUNT NO.:  [id number]   MEDICAL RECORD NO.:  [id number]                   PATIENT TYPE:  INP   LOCATION:  5019                                 FACILITY:  [facility]   PHYSICIAN:  Elana Alm. Thurston Hole, M.D.              DATE OF BIRTH:  1945-11-06   DATE OF ADMISSION:  06/14/2003  DATE OF DISCHARGE:  06/17/2003                                 DISCHARGE SUMMARY   ADMISSION DIAGNOSES:  1. End-stage degenerative joint disease right knee.  2. Hypertension.  3. History of an old myocardial infarction.   DISCHARGE DIAGNOSES:  1. End-stage degenerative joint disease right knee.  2. Hypertension.  3. Coronary artery disease.  4. Gastroesophageal reflux.  5. Depression.   HISTORY OF PRESENT ILLNESS:  The patient is a 65 year old female with a  longstanding history of bilateral knee DJD.  Currently her right knee is  significantly more painful than her left knee.  She has tried screening  arthroscopy, anti-inflammatories and cortisone for her knee pain all without  success.  She understands the risks, benefits, and possible complications of  a total knee replacement and is without question.   PROCEDURES IN HOUSE:  On June 14, 2003 the patient underwent a right total  knee replacement by Dr. Thurston Hole.  She tolerated the procedure well.   HOSPITAL COURSE:  Postop day 1 the patient was doing well.  RPCA was  discontinued.  She was started on OxyContin 40 mg 1 p.o. b.i.d. and OxyIR 1-  3 q.3-4h.  p.r.n. pain.  She did have a urinary tract infection on her labs.  She was started on Cipro 500 mg 1 p.o. b.i.d. for 7 days.  She progressed  well with physical therapy.  She struggled with mobility the first day.  T-  max of 99.4.  Postop day 2 hemoglobin 9.2, INR 1.7.  She was metabolically  stable.  Her right knee was dressed. Cardiac enzymes were negative for her  chest pain.  The patient was significantly improved. Her potassium was 3.4.  She was given  potassium supplementation 40 mEq p.o. daily.  On postop day 3  the patient was doing well except for a T-max of 102.1.  She was on Cipro.  Her urinary tract infection grew out Staphylococcus viridans that was not  susceptible to Cipro. She was placed on Keflex 500 mg 1 p.o. q.i.d. She was  encouraged to finish taking this until it was gone.   A bed opened up in subacute so she was discharged to subacute in stable  condition because her family could not take care of her at home.   MEDICATIONS:  1. She is on OxyContin 1 p.o. b.i.d.  2. She is on OxyIR 5 mg 1-3 q.3-4h. p.r.n. pain.  3. She is on Wellbutrin XL 1 p.o. q.a.m.  4. She is on Prozac 40 mg 1 p.o. q.a.m.  5.  Xanax 1 mg 1 p.o. b.i.d.  6. Toprol XL 25 mg 1 p.o. q.a.m.   We will continue to follow her on SACU.      Kirstin Shepperson, P.A.                  Robert A. Thurston Hole, M.D.    KS/MEDQ  D:  07/13/2003  T:  07/14/2003  Job:  [id number]

## 2011-03-22 NOTE — Discharge Summary (Signed)
[name], [name]                         ACCOUNT NO.:  [id number]   MEDICAL RECORD NO.:  [id number]                   PATIENT TYPE:  ORB   LOCATION:                                       FACILITY:  [facility]   PHYSICIAN:  Erick Colace, M.D.           DATE OF BIRTH:  Jan 17, 1946   DATE OF ADMISSION:  06/17/2003  DATE OF DISCHARGE:  06/24/2003                                 DISCHARGE SUMMARY   DISCHARGE DIAGNOSES:  1. Right total knee arthroplasty secondary to degenerative joint disease     June 14, 2003.  2. Chronic pain syndrome.  3. Postoperative anemia.  4. Coumadin for deep vein thrombosis prophylaxis.  5. History of depression with anxiety.  6. Hypertension.  7. Viridan streptococcus urinary tract infection.  8. Hypokalemia with hyponatremia, resolved.  9. History of nonspecific chest pain.   HISTORY OF PRESENT ILLNESS:  A 65 year old white female admitted August 10  with end-stage degenerative joint disease of the right knee and no change  with conservative care. Underwent a right total knee arthroplasty August 10  per Dr. Thurston Hole, placed on Coumadin for deep vein thrombosis prophylaxis and  weight bearing as tolerated. Postoperative nonspecific chest pain with  workup in the past. Followup per cardiology services, [facility].  Cardiac markers negative. Preoperative Cardiolite study negative. Noted bout  of hyponatremia 123, improved to 131. Hypokalemia 3.4 and supplemented. No  further bouts of chest pain. Her PCA morphine pump was discontinued August  11 with noted history of chronic pain syndrome on OxyContin continued  release. Placed on Keflex for Viridan streptococcus urinary tract infection  June 17, 2003. Supervision transfers and ambulation, minimal assistance  for stairs. Latest hemoglobin 8.2 on August 13. Venous Doppler study lower  extremities June 16, 2003 was negative for deep vein thrombosis. She was  admitted for comprehensive rehab  program.   PAST MEDICAL HISTORY:  See discharge diagnoses.   PAST SURGICAL HISTORY:  Appendectomy, hysterectomy.   ALLERGIES:  VALIUM.   HABITS:  No alcohol or tobacco.   PRIMARY MEDICAL DOCTOR:  Dr. Farris Has.   MEDICATIONS PRIOR TO ADMISSION:  1. OxyContin continued release 20 mg twice daily.  2. Prozac 40 mg daily.  3. Xanax 1 mg twice daily.  4. Tramadol 50 mg twice daily.  5. Toprol-XL 25 mg daily.   SOCIAL HISTORY:  Lives alone in [place]. Independent with a cane prior to  admission. She is a Location manager. Two-level home. Three steps to entry.  Bedroom upstairs. She rents the upstairs apartment of her sister's who can  provide intermittent supervision as needed on discharge.   HOSPITAL COURSE:  The patient did well on rehabilitation services with  therapies initiated daily. The following issues were followed during  patient's rehab course. Pertaining to [name]'s right total knee  arthroplasty, surgical site healing nicely. No signs of infection. Staples  remained intact. She was weight bearing  as tolerated with knee precautions.  Her knee immobilizer had been discontinued. Her CPM machine was at 80  degrees. Home health therapies per Genevieve Norlander had been arranged. She continued  on Coumadin for deep vein thrombosis prophylaxis. Latest INR 2.2. She did  receive a venous Doppler study of the lower extremities on August 13 that  was negative for deep vein thrombosis. Pain management ongoing. She had been  on OxyContin continued release of 20 mg twice daily prior to admission. This  had since been increased to 40 mg as well as continued with her OxyContin  immediate release. Postoperative anemia; she was transfused upon her  admission to rehab services for a hemoglobin of 7.5. She was asymptomatic.  Her followup hemoglobin was 10.6. She had a long history of depression and  anxiety and maintained on Xanax and Prozac. She was treated with Keflex for  a Viridan  streptococcus urinary tract infection. She denied any dysuria.  Blood pressure controlled with Toprol. Postoperative hypokalemia resolved  with latest potassiums of 4.2. Sodium also improved to 137. Overall for her  functional mobility, she was modified independence for transfers, bed  mobility, ambulation, using a rolling walker 200 feet, stairs were ongoing.  Plan for hospital bed if needed on her discharge. Sister will provide the  necessary supervision at home as needed.   LABORATORY DATA:  Latest labs showed of INR of 2.2, hemoglobin 10.6,  hematocrit 30.5, sodium 137, potassium 4.2, BUN 11, creatinine 0.8.   DISCHARGE MEDICATIONS:  1. Coumadin with latest dose of 4 mg to be completed July 15, 2003.  2. Trinsicon twice daily.  3. Toprol-XL 25 mg daily.  4. Xanax 1 mg twice daily.  5. Prozac 40 mg daily.  6. Keflex 500 mg four times daily for three days and discontinue.  7. OxyContin continued release 40 mg q.12h. for one week, decreased to 20 mg     q.12h.  8. Protonix 40 mg daily.  9. Oxycodone immediate release as needed pain.   ACTIVITY:  Weight bearing as tolerated.   DIET:  Regular.   WOUND CARE:  Cleanse incision daily with warm soap and water. Call Dr.  Thurston Hole if any increased redness, drainage, or fever.    SPECIAL INSTRUCTIONS:  Home health nurse to complete Coumadin protocol. The  patient should follow with Dr. Farris Has concerning decrease of ongoing  narcotics.      Mariam Dollar, P.A.                     Erick Colace, M.D.    DA/MEDQ  D:  06/23/2003  T:  06/24/2003  Job:  [id number]   cc:   Molly Maduro A. Thurston Hole, M.D.  [address] [place]  [place] [address]  Fax: [phone]   Estell Harpin, M.D.  [address]  [place] [address]  Fax: [phone]

## 2011-03-22 NOTE — Discharge Summary (Signed)
[name], RISON                         ACCOUNT NO.:  [id number]   MEDICAL RECORD NO.:  [id number]                   PATIENT TYPE:  IPS   LOCATION:  0303                                 FACILITY:  [facility]   PHYSICIAN:  Geoffery Lyons, M.D.                   DATE OF BIRTH:  Aug 28, 1946   DATE OF ADMISSION:  02/23/2004  DATE OF DISCHARGE:  02/28/2004                                 DISCHARGE SUMMARY   CHIEF COMPLAINT AND PRESENT ILLNESS:  This was the first admission to [facility] for this 65 year old white female widowed  voluntarily admitted.  History of depression, irritability, tearfulness,  argument with the family.  She said she wanted to end it all, taking  OxyContin, but felt that made her sick, using Ambien and Xanax prescriptions  early, took 4 extra Ambien the night before.   PAST PSYCHIATRIC HISTORY:  See Dr. Evelene Croon.  First time in [facility].  She was in [place] in 1992, psych inpatient.   ALCOHOL AND DRUG HISTORY:  Denies substance abuse, but admits to overusing  medications.   PAST MEDICAL HISTORY:  1. Known cardiac chest pain.  2. Diabetes mellitus type 2.  3. Arthritis pain in knees.  4. History of MI September 2004.   MEDICATIONS:  1. Toprol 25 mg per day.  2. Oxycodone 40 mg twice a day.  3. Xanax 1 mg 4 times a day.  4. Ambien 10 at bedtime for sleep.  5. Estradiol 1 daily.  6. Prozac.  7. Wellbutrin daily.   PHYSICAL EXAMINATION:  Performed and failed show any acute findings.   LABORATORY DATA:  Blood chemistry within normal limits.  Drug screening  positive for opioids and benzodiazepines.   MENTAL STATUS EXAM:  Reveals a fully alert female, moderate discomfort from  peri-sternal chest pain.  Affect irritable and tearful.  Speech, normal  rate, production, and tempo.  Mood irritable and depressed.  Thought  processes  positive for suicidal ruminations.  No plans.  No homicidal  ideas.  No delusions.  Somatically focused.   No hallucinations.  Cognition  well preserved.   ADMISSION DIAGNOSIS:   AXIS I:  Major depression, recurrent.   AXIS II:  No diagnosis.   AXIS III:  Chronic knee pain.   AXIS IV:  Moderate.   AXIS V:  1. Global assessment of functioning upon admission:  38.  2. Highest global assessment of functioning in the last year:  66.   HOSPITAL COURSE:  She was admitted and started in intensive individual and  group psychotherapy.  She was given Ambien for sleep.  She was given Librium  as needed for anxiety and possible withdrawal.  Neurontin 300 twice a day  and at night.  MiraLax 17 gram daily.  Oxycodone 20 mg twice a day.  Hydrocodone 5/325 every 6 hours as needed.  She was placed back  on  Wellbutrin XL 150 mg per day.  The Ambien was discontinued.  She was given  trazodone 100 at night.  She was started on Lexapro 5 mg per day and  Neurontin was increased to 300 three times a day and 1 at night.  She  endorsed having a difficult time, unable to function.  Feeling very  overwhelmed, anxious, depressed, unable to get her life back together and  thought that she might be taking more Xanax and Ambien.  It came to a point  where she was wanting to end it all.  She said that she did well on Serzone  until she was not able to get it anymore.  Currently on Lexapro and  Wellbutrin.  Continued to endorse multiple somatic complaints.  Easily  overwhelmed.  Becoming tearful.  Focusing on the pain.  Sense of loneliness,  hopeless, and helplessness.  We went ahead and increased the Lexapro to 10  and gave her some naproxen 500 twice a day.  Upset with the situation she  was going to go back to.  She felt that the job put a lot of strains on her  legs.  She was not going to be able to function at that job.   On February 28, 2004 she was better.  Did not endorse any homicidal or suicidal  ideation.  Her mood, she states, had improved.  Objectively, she looked  better.  She knew she had to continue  further outpatient treatments.  She  was willing to do so.   DISCHARGE DIAGNOSIS:   AXIS I:  1. Major depression, recurrent.  2. Anxiety disorder, not otherwise specified.   AXIS II:  No diagnosis.   AXIS III:  Chronic knee pain.   AXIS IV:  Moderate.   AXIS V:  Global assessment of functioning on discharge:  50-55.   DISCHARGE MEDICATIONS:  1. Lexapro 10 mg per day.  2. Wellbutrin XL 150 mg daily.  3. Oxycodone 10 mg slow release twice a day.  4. Toprol daily.  5. Neurontin 300 mg 3 times a day and at bedtime.  6. Trazodone 100 at night.  7. Claritin 10 one daily.  8. Humibid LA twice a day.  9. Naproxen 500 take twice a day.  10.      She was sent on 3-4 days of Librium 25 as needed for anxiety.   FOLLOW UP:  Dr. Lolly Mustache and Dr. Kieth Brightly in the [facility].                                               Geoffery Lyons, M.D.    IL/MEDQ  D:  03/21/2004  T:  03/22/2004  Job:  [id number]

## 2011-03-22 NOTE — Op Note (Signed)
[name], [name] NO.:  [id number]   MEDICAL RECORD NO.:  [id number]          PATIENT TYPE:  INP   LOCATION:  5022                         FACILITY:  [facility]   PHYSICIAN:  Robert A. Thurston [name], M.D. DATE OF BIRTH:  02-Mar-1946   DATE OF PROCEDURE:  09/17/2006  DATE OF DISCHARGE:                               OPERATIVE REPORT   PREOPERATIVE DIAGNOSIS:  Left knee degenerative joint disease.   POSTOPERATIVE DIAGNOSIS:  Left knee degenerative joint disease.   PROCEDURE:  Left total knee replacement using DePuy cemented total knee  system with 2.5 cemented femur, 3 cemented tibia with 10 mm polyethylene  RP tibial spacer and 35 mm polyethylene cemented patella.   SURGEON:  [name], M.D.   ASSISTANT:  [name], P.A.-C.   ANESTHESIA:  General.   OPERATIVE TIME:  1 hour 20 minutes.   COMPLICATIONS:  None.   DESCRIPTION OF PROCEDURE:  [name] is brought to operating room on  September 17, 2006, and placed on the operating table in a supine  position.  After an adequate level of general anesthesia was obtained,  she had a femoral nerve block placed by Dr. Katrinka [name] of anesthesia.  She  then had a Foley catheter placed.  She received Ancef 2 grams IV  preoperatively for prophylaxis.  Her left knee was examined.  Range of  motion from minus 5 to 125 degrees with mild valgus deformity, knee  stable ligamentous exam with normal patellar tracking.  The left leg was  prepped using sterile DuraPrep and draped using sterile technique.  The  leg was exsanguinated and a thigh tourniquet elevated to 365 mmHg.  Initially, through a 15 cm longitudinal incision based over the patella,  the initial exposure was made.  Underlying subcutaneous tissues were  incised in line with the skin incision.  A median arthrotomy was  performed revealing an excessive amount of normal appearing joint fluid.  The articular surfaces were inspected.  She had grade 3 and 4 changes  medially, grade 4 changes laterally, and grade 3 and 4 changes in the  patellofemoral joint.  Osteophytes were removed from the femoral  condyles and tibial plateau.  The medial and lateral meniscal remnants  were removed as well as the anterior cruciate ligament.  The  intramedullary drill was drilled up the femoral canal for placement of  the distal femoral cutting jig which was placed in the appropriate  amount of rotation and a distal 11 mm cut was made.  The distal femur  was incised.  Number 2.5 was found to be the appropriate size.  A 2.5  cutting jig was placed in the appropriate manner rotation and then these  cuts were made.  The proximal tibia was then exposed.  The tibial spines  were removed with an oscillating saw.  The intramedullary drill was  drilled down the tibial canal for placement of the proximal tibial  cutting jig which was placed in the appropriate amount of rotation and a  proximal 8 mm cut was made based off the medial side.  Spacer blocks  were then  placed in flexion and extension.  10 mm blocks were used. This  gave excellent balancing, excellent stability and excellent correction  of her flexion and valgus deformities.  At this point, the #3 tibial  base plate trial was placed on the cut tibial surface with an excellent  fit and the keel cut was made.  The PCL box cutter was placed on the  distal femur and then these cuts were made.  At this point, the 2.5  femoral trial was placed, #3 tibial baseplate with a 10 mm polyethylene  RP tibial spacer was placed.  The knee was reduced and taken through a  range of motion from 0 to 125 degrees with excellent stability and  excellent correction of her flexion and valgus deformities.  Patellar  tracking was evaluated and this was found be normal.  At this point, the  patella resurfacing cut was made and 9 mm resurfacing cut was made.  Three locking holes were placed for a 35 mm patella.  The patellar trial  was placed  and, again, patellofemoral tracking was evaluated and this  was found to be normal.  At this point, it was felt that all the trial  components were of excellent size, fit, and stability.  They were then  removed.  The knee was then jet lavage irrigated with 3 liters of  saline.  The proximal tibia was exposed and #3 tibial baseplate with  cement backing was hammered into position with an excellent fit with  excess cement being removed from around the edges.  The 2.5 femoral  component with cement backing was hammered into position also with an  excellent fit with excess cement being removed from around the edges.  The 10 mm polyethylene RP tibial spacer was then placed on the tibial  baseplate and the knee reduced, taken through a range of motion from 0  to 125 degrees with excellent stability and excellent correction of her  flexion and valgus deformities with normal patellar tracking as well  with the 35 mm polyethylene cement backed patella.  At this point, it  was felt that all the components were of excellent size, fit, and  stability.  The wound was further irrigated with saline and then the  tourniquet was released.  Hemostasis was obtained with cautery.  The  knee was then further jet lavage irrigated, as well, and then the  arthrotomy was closed with #1 Ethilon suture over two medium Hemovac  drains.  The subcutaneous tissues were closed with 0 and 2-0 Vicryl,  subcuticular layer closed with 4-0 Monocryl.  Sterile dressings were  applied.  The Hemovac was injected 0.2% Marcaine with epinephrine and 4  mg morphine and clamped.  The patient was then awakened, extubated, and  taken to the recovery room in stable condition.  Needle and sponge  counts were correct x2 at the end of the case.      Robert A. Thurston [name], M.D.  Electronically Signed     RAW/MEDQ  D:  09/17/2006  T:  09/17/2006  Job:  [id number]

## 2011-03-22 NOTE — Cardiovascular Report (Signed)
[facility]  Patient:    [name], [name]               MRN: [id number] Proc. Date: 03/04/01 Adm. Date:  [id number] Attending:  Eleanora Neighbor CC:         Timothy P. Fontaine, M.D.   Cardiac Catheterization  INDICATIONS:  Ms. Schlagel is a 65 year old female who presented with nausea and vomiting and chest pain.  She had anterior chest pain and felt weak and nauseated throughout the day.  Myocardial infarction was ruled out and she was referred for catheterization.  PROCEDURE:  Left heart catheterization with selective coronary angiography and left ventricular angiography.  TYPE AND SITE OF ENTRY:  Percutaneous right femoral artery with Perclose.  CATHETERS:  6 French 4 curved Judkins right and left coronary catheters, 6 French pigtail ventriculography catheter.  MEDICATIONS GIVEN:  Prior to the procedure; none.  During the procedure; Versed 2 mg IV, Fentanyl 25 mcg IV.  CONTRAST MATERIAL:  Omnipaque.  COMMENTS:  While using lidocaine to anesthetize the groin, a syringe of nitroglycerin was inadvertantly used and the patient received approximately 9 mg of nitroglycerin subcutaneously.  She was watched for 30 minutes and during that time was given IV fluids.  Blood pressure never fell below 75 systolic and she was started on low dose dopamine and it was tapered before she left the catheterization lab.  We did infiltrate the area with lidocaine with epinephrine to slow the release of the nitroglycerin.  Nitroglycerin patch was removed.  HEMODYNAMIC DATA:  Aortic pressure was 77/40, LV 77/6-10.  There was no aortic valve gradient noted on pullback.  ANGIOGRAPHIC DATA:   Left main coronary artery is normal.  Left anterior descending had somewhat sluggish flow, but it was normal.  There was no atherosclerosis noted.  There were five cardiac cycles for complete opacification of the left coronary system.  Left circumflex is  normal.  Right coronary artery.  The right coronary artery is a large dominant vessel. It opacifies in three cardiac cycles.  It is somewhat tortuous in the posterior descending and posterolateral branch, but otherwise is normal and free of atherosclerotic narrowing.  Left ventricular angiogram.  The left ventricular angiogram was performed in the RAO position.  Overall cardiac size and silhouette are normal.  Left ventricular function is normal.  OVERALL IMPRESSION: 1. Normal left ventricular function. 2. Normal coronary arteries. DD:  03/04/01 TD:  03/05/01 Job: [id number]

## 2011-05-17 ENCOUNTER — Observation Stay: Payer: Self-pay | Admitting: Internal Medicine

## 2011-07-26 LAB — COMPREHENSIVE METABOLIC PANEL
ALT: 34
AST: 32
Albumin: 3.8
Alkaline Phosphatase: 103
BUN: 13
CO2: 27
Calcium: 9.4
Chloride: 97
Creatinine, Ser: 0.7
GFR calc Af Amer: >60
GFR calc non Af Amer: >60
Glucose, Bld: 199 — ABNORMAL HIGH
Potassium: 4.3
Sodium: 134 — ABNORMAL LOW
Total Bilirubin: 0.9
Total Protein: 6.6

## 2011-07-26 LAB — HEMOGLOBIN A1C
Hgb A1c MFr Bld: 10.8 — ABNORMAL HIGH
Mean Plasma Glucose: 307

## 2011-07-26 LAB — URINE CULTURE
Colony Count: NO GROWTH
Culture: NO GROWTH
Special Requests: NEGATIVE

## 2011-07-26 LAB — BASIC METABOLIC PANEL
BUN: 9
CO2: 28
Calcium: 9.3
Chloride: 101
Creatinine, Ser: 0.65
GFR calc Af Amer: >60
GFR calc non Af Amer: >60
Glucose, Bld: 227 — ABNORMAL HIGH
Potassium: 4.5
Sodium: 138

## 2011-07-26 LAB — CBC
HCT: 38.3
Hemoglobin: 13.1
MCHC: 34.2
MCV: 83.9
Platelets: 170
RBC: 4.57
RDW: 15.2
WBC: 6.8

## 2011-07-26 LAB — GENTAMICIN LEVEL, RANDOM: Gentamicin Rm: 2 ug/mL

## 2011-07-29 LAB — URINALYSIS, ROUTINE W REFLEX MICROSCOPIC
Bilirubin Urine: NEGATIVE
Glucose, UA: >1000 — AB
Hgb urine dipstick: NEGATIVE
Ketones, ur: NEGATIVE
Leukocytes, UA: NEGATIVE
Nitrite: NEGATIVE
Protein, ur: NEGATIVE
Specific Gravity, Urine: 1.021
Urobilinogen, UA: 0.2
pH: 6

## 2011-07-29 LAB — WOUND CULTURE

## 2011-07-29 LAB — COMPREHENSIVE METABOLIC PANEL
ALT: 22
ALT: 28
AST: 18
AST: 22
Albumin: 2.8 — ABNORMAL LOW
Albumin: 3.5
Alkaline Phosphatase: 66
Alkaline Phosphatase: 78
BUN: 11
BUN: 9
CO2: 26
CO2: 27
Calcium: 8.5
Calcium: 9.2
Chloride: 102
Chloride: 96
Creatinine, Ser: 0.77
Creatinine, Ser: 0.78
GFR calc Af Amer: >60
GFR calc Af Amer: >60
GFR calc non Af Amer: >60
GFR calc non Af Amer: >60
Glucose, Bld: 187 — ABNORMAL HIGH
Glucose, Bld: 212 — ABNORMAL HIGH
Potassium: 3.8
Potassium: 4.2
Sodium: 138
Sodium: 139
Total Bilirubin: 0.6
Total Bilirubin: 0.8
Total Protein: 5.9 — ABNORMAL LOW
Total Protein: 7.3

## 2011-07-29 LAB — DIFFERENTIAL
Basophils Absolute: 0.1
Basophils Relative: 1
Eosinophils Absolute: 0.4
Eosinophils Relative: 4
Lymphocytes Relative: 19
Lymphs Abs: 1.9
Monocytes Absolute: 0.9
Monocytes Relative: 9
Neutro Abs: 6.9
Neutrophils Relative %: 69

## 2011-07-29 LAB — URINE MICROSCOPIC-ADD ON

## 2011-07-29 LAB — URINE CULTURE
Colony Count: NO GROWTH
Culture: NO GROWTH

## 2011-07-29 LAB — CBC
HCT: 31.1 — ABNORMAL LOW
HCT: 33.6 — ABNORMAL LOW
HCT: 38
Hemoglobin: 10.6 — ABNORMAL LOW
Hemoglobin: 11.4 — ABNORMAL LOW
Hemoglobin: 13
MCHC: 34
MCHC: 34
MCHC: 34.2
MCV: 86.2
MCV: 86.2
MCV: 86.3
Platelets: 182
Platelets: 186
Platelets: 187
RBC: 3.61 — ABNORMAL LOW
RBC: 3.89
RBC: 4.41
RDW: 15.5
RDW: 15.6 — ABNORMAL HIGH
RDW: 15.7 — ABNORMAL HIGH
WBC: 10.1
WBC: 7.4
WBC: 9.7

## 2011-07-29 LAB — BASIC METABOLIC PANEL
BUN: 6
CO2: 29
Calcium: 8.7
Chloride: 101
Creatinine, Ser: 0.71
GFR calc Af Amer: >60
GFR calc non Af Amer: >60
Glucose, Bld: 184 — ABNORMAL HIGH
Potassium: 4.1
Sodium: 136

## 2011-07-29 LAB — HEMOGLOBIN A1C
Hgb A1c MFr Bld: 9 — ABNORMAL HIGH
Mean Plasma Glucose: 243

## 2011-07-29 LAB — CULTURE, BLOOD (ROUTINE X 2)
Culture: NO GROWTH
Culture: NO GROWTH

## 2011-07-29 LAB — MAGNESIUM: Magnesium: 1.8

## 2011-07-29 LAB — PHOSPHORUS: Phosphorus: 4.7 — ABNORMAL HIGH

## 2011-07-29 LAB — VANCOMYCIN, TROUGH: Vancomycin Tr: 11.2

## 2011-08-01 LAB — FOLATE RBC: RBC Folate: 1760 — ABNORMAL HIGH

## 2011-08-01 LAB — COMPREHENSIVE METABOLIC PANEL WITH GFR
ALT: 20
AST: 20
Albumin: 4
Alkaline Phosphatase: 63
BUN: 19
CO2: 25
Calcium: 9.9
Chloride: 103
Creatinine, Ser: 0.77
GFR calc non Af Amer: >60
Glucose, Bld: 83
Potassium: 4.2
Sodium: 139
Total Bilirubin: 1
Total Protein: 7.2

## 2011-08-01 LAB — HEPATIC FUNCTION PANEL
ALT: 27
AST: 38 — ABNORMAL HIGH
Albumin: 3.9
Alkaline Phosphatase: 65
Bilirubin, Direct: 0.2
Indirect Bilirubin: 0.8
Total Bilirubin: 1
Total Protein: 7.2

## 2011-08-01 LAB — URINALYSIS, ROUTINE W REFLEX MICROSCOPIC
Bilirubin Urine: NEGATIVE
Glucose, UA: NEGATIVE
Hgb urine dipstick: NEGATIVE
Ketones, ur: NEGATIVE
Nitrite: POSITIVE — AB
Protein, ur: NEGATIVE
Specific Gravity, Urine: 1.02
Urobilinogen, UA: 0.2
pH: 5.5

## 2011-08-01 LAB — HEMOGLOBIN A1C
Hgb A1c MFr Bld: 6.1
Mean Plasma Glucose: 140

## 2011-08-01 LAB — LACTATE DEHYDROGENASE: LDH: 148

## 2011-08-01 LAB — RPR: RPR Ser Ql: NONREACTIVE

## 2011-08-01 LAB — DIFFERENTIAL
Basophils Absolute: 0.1
Basophils Relative: 1
Eosinophils Absolute: 0.1
Eosinophils Relative: 1
Lymphocytes Relative: 29
Lymphs Abs: 2.8
Monocytes Absolute: 0.6
Monocytes Relative: 6
Neutro Abs: 6.2
Neutrophils Relative %: 64

## 2011-08-01 LAB — CBC
HCT: 41.1
Hemoglobin: 13.8
MCHC: 33.6
MCV: 85.4
Platelets: 180
RBC: 4.81
RDW: 14.2
WBC: 9.8

## 2011-08-01 LAB — URINE CULTURE: Colony Count: >=100000

## 2011-08-01 LAB — VITAMIN B12: Vitamin B-12: 554 (ref 211–911)

## 2011-08-01 LAB — URINE MICROSCOPIC-ADD ON

## 2011-08-01 LAB — CK: Total CK: 75

## 2011-08-01 LAB — TSH: TSH: 2.721

## 2011-08-01 LAB — LACTIC ACID, PLASMA: Lactic Acid, Venous: 1.3

## 2011-08-02 LAB — GLUCOSE, CAPILLARY
Glucose-Capillary: 108 — ABNORMAL HIGH
Glucose-Capillary: 109 — ABNORMAL HIGH
Glucose-Capillary: 121 — ABNORMAL HIGH
Glucose-Capillary: 89

## 2011-08-05 LAB — GLUCOSE, CAPILLARY
Glucose-Capillary: 106 — ABNORMAL HIGH
Glucose-Capillary: 108 — ABNORMAL HIGH
Glucose-Capillary: 113 — ABNORMAL HIGH
Glucose-Capillary: 113 — ABNORMAL HIGH
Glucose-Capillary: 118 — ABNORMAL HIGH
Glucose-Capillary: 123 — ABNORMAL HIGH
Glucose-Capillary: 123 — ABNORMAL HIGH
Glucose-Capillary: 126 — ABNORMAL HIGH
Glucose-Capillary: 127 — ABNORMAL HIGH
Glucose-Capillary: 128 — ABNORMAL HIGH
Glucose-Capillary: 129 — ABNORMAL HIGH
Glucose-Capillary: 130 — ABNORMAL HIGH
Glucose-Capillary: 131 — ABNORMAL HIGH
Glucose-Capillary: 132 — ABNORMAL HIGH
Glucose-Capillary: 136 — ABNORMAL HIGH
Glucose-Capillary: 141 — ABNORMAL HIGH
Glucose-Capillary: 141 — ABNORMAL HIGH
Glucose-Capillary: 142 — ABNORMAL HIGH
Glucose-Capillary: 142 — ABNORMAL HIGH
Glucose-Capillary: 142 — ABNORMAL HIGH
Glucose-Capillary: 145 — ABNORMAL HIGH
Glucose-Capillary: 146 — ABNORMAL HIGH
Glucose-Capillary: 147 — ABNORMAL HIGH
Glucose-Capillary: 152 — ABNORMAL HIGH
Glucose-Capillary: 155 — ABNORMAL HIGH
Glucose-Capillary: 159 — ABNORMAL HIGH
Glucose-Capillary: 174 — ABNORMAL HIGH
Glucose-Capillary: 186 — ABNORMAL HIGH
Glucose-Capillary: 203 — ABNORMAL HIGH
Glucose-Capillary: 207 — ABNORMAL HIGH

## 2011-08-05 LAB — BASIC METABOLIC PANEL
BUN: 10
BUN: 12
BUN: 13
BUN: 9
CO2: 26
CO2: 28
CO2: 29
CO2: 30
Calcium: 9.1
Calcium: 9.3
Calcium: 9.4
Calcium: 9.4
Chloride: 103
Chloride: 104
Chloride: 106
Chloride: 106
Creatinine, Ser: 0.63
Creatinine, Ser: 0.65
Creatinine, Ser: 0.68
Creatinine, Ser: 0.73
GFR calc Af Amer: >60
GFR calc Af Amer: >60
GFR calc Af Amer: >60
GFR calc Af Amer: >60
GFR calc non Af Amer: >60
GFR calc non Af Amer: >60
GFR calc non Af Amer: >60
GFR calc non Af Amer: >60
Glucose, Bld: 126 — ABNORMAL HIGH
Glucose, Bld: 134 — ABNORMAL HIGH
Glucose, Bld: 139 — ABNORMAL HIGH
Glucose, Bld: 159 — ABNORMAL HIGH
Potassium: 3.5
Potassium: 4
Potassium: 4.1
Potassium: 4.1
Sodium: 138
Sodium: 138
Sodium: 142
Sodium: 143

## 2011-08-05 LAB — COMPREHENSIVE METABOLIC PANEL
ALT: 42 — ABNORMAL HIGH
AST: 38 — ABNORMAL HIGH
Albumin: 3.4 — ABNORMAL LOW
Alkaline Phosphatase: 56
BUN: 12
CO2: 26
Calcium: 9.4
Chloride: 97
Creatinine, Ser: 0.74
GFR calc Af Amer: >60
GFR calc non Af Amer: >60
Glucose, Bld: 136 — ABNORMAL HIGH
Potassium: 3.6
Sodium: 137
Total Bilirubin: 1.3 — ABNORMAL HIGH
Total Protein: 6.4

## 2011-08-05 LAB — CBC
HCT: 39.3
HCT: 40.8
Hemoglobin: 13.1
Hemoglobin: 13.5
MCHC: 33.2
MCHC: 33.4
MCV: 88.1
MCV: 88.4
Platelets: 186
Platelets: 187
RBC: 4.45
RBC: 4.63
RDW: 15.5
RDW: 16 — ABNORMAL HIGH
WBC: 5.6
WBC: 8

## 2011-08-05 LAB — TROPONIN I: Troponin I: 0.02

## 2011-08-05 LAB — CK TOTAL AND CKMB (NOT AT ARMC)
CK, MB: 10.9 — ABNORMAL HIGH
CK, MB: 2.6
Relative Index: 1.1
Relative Index: 2.7 — ABNORMAL HIGH
Total CK: 240 — ABNORMAL HIGH
Total CK: 405 — ABNORMAL HIGH

## 2011-08-05 LAB — MAGNESIUM: Magnesium: 1.8

## 2011-08-05 LAB — CORTISOL: Cortisol, Plasma: 17.9

## 2011-08-07 LAB — GLUCOSE, CAPILLARY
Glucose-Capillary: 104 — ABNORMAL HIGH
Glucose-Capillary: 104 — ABNORMAL HIGH
Glucose-Capillary: 105 — ABNORMAL HIGH
Glucose-Capillary: 107 — ABNORMAL HIGH
Glucose-Capillary: 109 — ABNORMAL HIGH
Glucose-Capillary: 112 — ABNORMAL HIGH
Glucose-Capillary: 113 — ABNORMAL HIGH
Glucose-Capillary: 114 — ABNORMAL HIGH
Glucose-Capillary: 118 — ABNORMAL HIGH
Glucose-Capillary: 118 — ABNORMAL HIGH
Glucose-Capillary: 120 — ABNORMAL HIGH
Glucose-Capillary: 121 — ABNORMAL HIGH
Glucose-Capillary: 122 — ABNORMAL HIGH
Glucose-Capillary: 123 — ABNORMAL HIGH
Glucose-Capillary: 123 — ABNORMAL HIGH
Glucose-Capillary: 124 — ABNORMAL HIGH
Glucose-Capillary: 124 — ABNORMAL HIGH
Glucose-Capillary: 124 — ABNORMAL HIGH
Glucose-Capillary: 126 — ABNORMAL HIGH
Glucose-Capillary: 127 — ABNORMAL HIGH
Glucose-Capillary: 131 — ABNORMAL HIGH
Glucose-Capillary: 138 — ABNORMAL HIGH
Glucose-Capillary: 140 — ABNORMAL HIGH
Glucose-Capillary: 142 — ABNORMAL HIGH
Glucose-Capillary: 160 — ABNORMAL HIGH
Glucose-Capillary: 166 — ABNORMAL HIGH
Glucose-Capillary: 166 — ABNORMAL HIGH
Glucose-Capillary: 195 — ABNORMAL HIGH
Glucose-Capillary: 92
Glucose-Capillary: 97

## 2011-08-07 LAB — CK TOTAL AND CKMB (NOT AT ARMC)
CK, MB: 15.8 — ABNORMAL HIGH
CK, MB: 2.7
CK, MB: 2.7
CK, MB: 3.6
CK, MB: 305.8 — ABNORMAL HIGH
CK, MB: 36.8 — ABNORMAL HIGH
CK, MB: 5.8 — ABNORMAL HIGH
CK, MB: 9.1 — ABNORMAL HIGH
Relative Index: 0.2
Relative Index: 0.2
Relative Index: 0.2
Relative Index: 0.3
Relative Index: 0.3
Relative Index: 0.5
Relative Index: 0.7
Relative Index: 0.8
Total CK: 1169 — ABNORMAL HIGH
Total CK: 12905 — ABNORMAL HIGH
Total CK: 2425 — ABNORMAL HIGH
Total CK: 356 — ABNORMAL HIGH
Total CK: 4115 — ABNORMAL HIGH
Total CK: 41591 — ABNORMAL HIGH
Total CK: 520 — ABNORMAL HIGH
Total CK: 7738 — ABNORMAL HIGH

## 2011-08-07 LAB — RENAL FUNCTION PANEL
Albumin: 2.8 — ABNORMAL LOW
Albumin: 2.8 — ABNORMAL LOW
Albumin: 3.1 — ABNORMAL LOW
Albumin: 3.2 — ABNORMAL LOW
BUN: 14
BUN: 14
BUN: 18
BUN: 9
CO2: 21
CO2: 22
CO2: 24
CO2: 24
Calcium: 8.1 — ABNORMAL LOW
Calcium: 8.5
Calcium: 8.5
Calcium: 8.7
Chloride: 109
Chloride: 112
Chloride: 112
Chloride: 116 — ABNORMAL HIGH
Creatinine, Ser: 0.97
Creatinine, Ser: 1.23 — ABNORMAL HIGH
Creatinine, Ser: 1.25 — ABNORMAL HIGH
Creatinine, Ser: 1.53 — ABNORMAL HIGH
GFR calc Af Amer: 42 — ABNORMAL LOW
GFR calc Af Amer: 53 — ABNORMAL LOW
GFR calc Af Amer: 54 — ABNORMAL LOW
GFR calc Af Amer: >60
GFR calc non Af Amer: 35 — ABNORMAL LOW
GFR calc non Af Amer: 44 — ABNORMAL LOW
GFR calc non Af Amer: 44 — ABNORMAL LOW
GFR calc non Af Amer: 58 — ABNORMAL LOW
Glucose, Bld: 112 — ABNORMAL HIGH
Glucose, Bld: 125 — ABNORMAL HIGH
Glucose, Bld: 133 — ABNORMAL HIGH
Glucose, Bld: 156 — ABNORMAL HIGH
Phosphorus: 2.5
Phosphorus: 2.7
Phosphorus: 3.1
Phosphorus: 4.2
Potassium: 3.5
Potassium: 3.6
Potassium: 3.6
Potassium: 4.6
Sodium: 140
Sodium: 142
Sodium: 143
Sodium: 144

## 2011-08-07 LAB — BASIC METABOLIC PANEL
BUN: 11
BUN: 21
BUN: 22
BUN: 5 — ABNORMAL LOW
BUN: 6
BUN: 6
BUN: 7
BUN: 8
BUN: 9
CO2: 20
CO2: 23
CO2: 23
CO2: 25
CO2: 25
CO2: 27
CO2: 27
CO2: 28
CO2: 28
Calcium: 8.2 — ABNORMAL LOW
Calcium: 8.3 — ABNORMAL LOW
Calcium: 8.4
Calcium: 8.4
Calcium: 8.5
Calcium: 8.6
Calcium: 8.6
Calcium: 8.8
Calcium: 8.9
Chloride: 100
Chloride: 102
Chloride: 105
Chloride: 107
Chloride: 107
Chloride: 108
Chloride: 109
Chloride: 112
Chloride: 114 — ABNORMAL HIGH
Creatinine, Ser: 0.63
Creatinine, Ser: 0.65
Creatinine, Ser: 0.82
Creatinine, Ser: 0.83
Creatinine, Ser: 0.87
Creatinine, Ser: 0.95
Creatinine, Ser: 1.01
Creatinine, Ser: 1.24 — ABNORMAL HIGH
Creatinine, Ser: 1.28 — ABNORMAL HIGH
GFR calc Af Amer: 51 — ABNORMAL LOW
GFR calc Af Amer: 53 — ABNORMAL LOW
GFR calc Af Amer: >60
GFR calc Af Amer: >60
GFR calc Af Amer: >60
GFR calc Af Amer: >60
GFR calc Af Amer: >60
GFR calc Af Amer: >60
GFR calc Af Amer: >60
GFR calc non Af Amer: 42 — ABNORMAL LOW
GFR calc non Af Amer: 44 — ABNORMAL LOW
GFR calc non Af Amer: 56 — ABNORMAL LOW
GFR calc non Af Amer: 60 — ABNORMAL LOW
GFR calc non Af Amer: >60
GFR calc non Af Amer: >60
GFR calc non Af Amer: >60
GFR calc non Af Amer: >60
GFR calc non Af Amer: >60
Glucose, Bld: 100 — ABNORMAL HIGH
Glucose, Bld: 103 — ABNORMAL HIGH
Glucose, Bld: 110 — ABNORMAL HIGH
Glucose, Bld: 115 — ABNORMAL HIGH
Glucose, Bld: 133 — ABNORMAL HIGH
Glucose, Bld: 133 — ABNORMAL HIGH
Glucose, Bld: 161 — ABNORMAL HIGH
Glucose, Bld: 172 — ABNORMAL HIGH
Glucose, Bld: 95
Potassium: 2.8 — ABNORMAL LOW
Potassium: 3 — ABNORMAL LOW
Potassium: 3.1 — ABNORMAL LOW
Potassium: 3.1 — ABNORMAL LOW
Potassium: 3.3 — ABNORMAL LOW
Potassium: 3.3 — ABNORMAL LOW
Potassium: 3.5
Potassium: 3.6
Potassium: 4
Sodium: 136
Sodium: 139
Sodium: 140
Sodium: 141
Sodium: 141
Sodium: 142
Sodium: 142
Sodium: 143
Sodium: 146 — ABNORMAL HIGH

## 2011-08-07 LAB — URINE MICROSCOPIC-ADD ON

## 2011-08-07 LAB — SODIUM, URINE, RANDOM
Sodium, Ur: 30
Sodium, Ur: 89

## 2011-08-07 LAB — CBC
HCT: 37.2
HCT: 43.6
Hemoglobin: 12.6
Hemoglobin: 14.3
MCHC: 32.7
MCHC: 33.8
MCV: 86.6
MCV: 87.6
Platelets: 164
Platelets: 188
RBC: 4.3
RBC: 4.98
RDW: 15.4
RDW: 15.5
WBC: 11.8 — ABNORMAL HIGH
WBC: 14.1 — ABNORMAL HIGH

## 2011-08-07 LAB — DIFFERENTIAL
Basophils Absolute: 0
Basophils Relative: 0
Eosinophils Absolute: 0
Eosinophils Relative: 0
Lymphocytes Relative: 9 — ABNORMAL LOW
Lymphs Abs: 1.3
Monocytes Absolute: 1
Monocytes Relative: 7
Neutro Abs: 11.8 — ABNORMAL HIGH
Neutrophils Relative %: 83 — ABNORMAL HIGH

## 2011-08-07 LAB — HEPATITIS PANEL, ACUTE
HCV Ab: NEGATIVE
Hep A IgM: NEGATIVE
Hep B C IgM: NEGATIVE
Hepatitis B Surface Ag: NEGATIVE

## 2011-08-07 LAB — COMPREHENSIVE METABOLIC PANEL
ALT: 179 — ABNORMAL HIGH
AST: 460 — ABNORMAL HIGH
Albumin: 3.1 — ABNORMAL LOW
Alkaline Phosphatase: 52
BUN: 13
CO2: 24
Calcium: 8.5
Chloride: 112
Creatinine, Ser: 1.26 — ABNORMAL HIGH
GFR calc Af Amer: 52 — ABNORMAL LOW
GFR calc non Af Amer: 43 — ABNORMAL LOW
Glucose, Bld: 112 — ABNORMAL HIGH
Potassium: 3.6
Sodium: 142
Total Bilirubin: 0.9
Total Protein: 5.8 — ABNORMAL LOW

## 2011-08-07 LAB — POCT I-STAT, CHEM 8
BUN: 21
Calcium, Ion: 1.06 — ABNORMAL LOW
Chloride: 111
Creatinine, Ser: 1.7 — ABNORMAL HIGH
Glucose, Bld: 161 — ABNORMAL HIGH
HCT: 44
Hemoglobin: 15
Potassium: 3.1 — ABNORMAL LOW
Sodium: 140
TCO2: 20

## 2011-08-07 LAB — PHOSPHORUS: Phosphorus: 3.6

## 2011-08-07 LAB — URINALYSIS, ROUTINE W REFLEX MICROSCOPIC
Glucose, UA: NEGATIVE
Ketones, ur: 40 — AB
Nitrite: POSITIVE — AB
Protein, ur: 100 — AB
Specific Gravity, Urine: 1.024
Urobilinogen, UA: 1
pH: 5

## 2011-08-07 LAB — HEPATIC FUNCTION PANEL
ALT: 196 — ABNORMAL HIGH
ALT: 225 — ABNORMAL HIGH
AST: 586 — ABNORMAL HIGH
AST: 853 — ABNORMAL HIGH
Albumin: 3.2 — ABNORMAL LOW
Albumin: 3.7
Alkaline Phosphatase: 50
Alkaline Phosphatase: 55
Bilirubin, Direct: 0.1
Bilirubin, Direct: 0.5 — ABNORMAL HIGH
Indirect Bilirubin: 0.9
Indirect Bilirubin: 1.2 — ABNORMAL HIGH
Total Bilirubin: 1
Total Bilirubin: 1.7 — ABNORMAL HIGH
Total Protein: 5.9 — ABNORMAL LOW
Total Protein: 6.3

## 2011-08-07 LAB — MAGNESIUM
Magnesium: 1.6
Magnesium: 1.7
Magnesium: 1.9
Magnesium: 2.1

## 2011-08-07 LAB — CARDIAC PANEL(CRET KIN+CKTOT+MB+TROPI)
CK, MB: 105.4 — ABNORMAL HIGH
CK, MB: 196.1 — ABNORMAL HIGH
CK, MB: 62.8 — ABNORMAL HIGH
Relative Index: 0.4
Relative Index: 0.5
Relative Index: 0.6
Total CK: 15696 — ABNORMAL HIGH
Total CK: 21586 — ABNORMAL HIGH
Total CK: 32096 — ABNORMAL HIGH
Troponin I: 0.13 — ABNORMAL HIGH
Troponin I: 0.18 — ABNORMAL HIGH
Troponin I: 0.26 — ABNORMAL HIGH

## 2011-08-07 LAB — LIPID PANEL
Cholesterol: 144
HDL: 49
LDL Cholesterol: 63
Total CHOL/HDL Ratio: 2.9
Triglycerides: 162 — ABNORMAL HIGH
VLDL: 32

## 2011-08-07 LAB — ACETAMINOPHEN LEVEL: Acetaminophen (Tylenol), Serum: <10 — ABNORMAL LOW

## 2011-08-07 LAB — URINE CULTURE: Colony Count: >=100000

## 2011-08-07 LAB — PROTIME-INR
INR: 1.1
Prothrombin Time: 14.6

## 2011-08-07 LAB — LIPASE, BLOOD: Lipase: 12

## 2011-08-07 LAB — CREATININE, URINE, RANDOM: Creatinine, Urine: 47.8

## 2011-08-07 LAB — APTT: aPTT: 28

## 2011-08-07 LAB — SALICYLATE LEVEL: Salicylate Lvl: <4

## 2011-08-07 LAB — TSH: TSH: 2.812

## 2011-10-23 ENCOUNTER — Inpatient Hospital Stay: Payer: Self-pay | Admitting: Internal Medicine

## 2012-05-29 ENCOUNTER — Emergency Department: Payer: Self-pay | Admitting: *Deleted

## 2012-05-29 DIAGNOSIS — R569 Unspecified convulsions: Secondary | ICD-10-CM | POA: Insufficient documentation

## 2012-05-29 LAB — COMPREHENSIVE METABOLIC PANEL
Albumin: 3.6 g/dL (ref 3.4–5.0)
Alkaline Phosphatase: 74 U/L (ref 50–136)
Anion Gap: 11 (ref 7–16)
BUN: 12 mg/dL (ref 7–18)
Bilirubin,Total: 0.3 mg/dL (ref 0.2–1.0)
Calcium, Total: 8.8 mg/dL (ref 8.5–10.1)
Chloride: 97 mmol/L — ABNORMAL LOW (ref 98–107)
Co2: 25 mmol/L (ref 21–32)
Creatinine: 0.74 mg/dL (ref 0.60–1.30)
EGFR (African American): >60
EGFR (Non-African Amer.): >60
Glucose: 188 mg/dL — ABNORMAL HIGH (ref 65–99)
Osmolality: 271 (ref 275–301)
Potassium: 4.2 mmol/L (ref 3.5–5.1)
SGOT(AST): 28 U/L (ref 15–37)
SGPT (ALT): 29 U/L
Sodium: 133 mmol/L — ABNORMAL LOW (ref 136–145)
Total Protein: 6.7 g/dL (ref 6.4–8.2)

## 2012-05-29 LAB — CBC WITH DIFFERENTIAL/PLATELET
Basophil #: 0.1 10*3/uL (ref 0.0–0.1)
Basophil %: 0.6 %
Eosinophil #: 0.5 10*3/uL (ref 0.0–0.7)
Eosinophil %: 5 %
HCT: 35.4 % (ref 35.0–47.0)
HGB: 12.5 g/dL (ref 12.0–16.0)
Lymphocyte #: 2.2 10*3/uL (ref 1.0–3.6)
Lymphocyte %: 21.8 %
MCH: 30.2 pg (ref 26.0–34.0)
MCHC: 35.3 g/dL (ref 32.0–36.0)
MCV: 86 fL (ref 80–100)
Monocyte #: 0.7 x10 3/mm (ref 0.2–0.9)
Monocyte %: 6.9 %
Neutrophil #: 6.6 10*3/uL — ABNORMAL HIGH (ref 1.4–6.5)
Neutrophil %: 65.7 %
Platelet: 155 10*3/uL (ref 150–440)
RBC: 4.13 10*6/uL (ref 3.80–5.20)
RDW: 14.8 % — ABNORMAL HIGH (ref 11.5–14.5)
WBC: 10.1 10*3/uL (ref 3.6–11.0)

## 2012-05-29 LAB — URINALYSIS, COMPLETE
Bacteria: NONE SEEN
Bilirubin,UR: NEGATIVE
Blood: NEGATIVE
Glucose,UR: 50 mg/dL (ref 0–75)
Ketone: NEGATIVE
Leukocyte Esterase: NEGATIVE
Nitrite: NEGATIVE
Ph: 6 (ref 4.5–8.0)
Protein: NEGATIVE
RBC,UR: NONE SEEN /HPF (ref 0–5)
Specific Gravity: 1.015 (ref 1.003–1.030)
Squamous Epithelial: 2
WBC UR: 2 /HPF (ref 0–5)

## 2012-05-29 LAB — TROPONIN I: Troponin-I: <0.02 ng/mL

## 2012-08-17 ENCOUNTER — Ambulatory Visit: Payer: Self-pay | Admitting: Pain Medicine

## 2012-08-17 LAB — BASIC METABOLIC PANEL
Anion Gap: 11 (ref 7–16)
BUN: 32 mg/dL — ABNORMAL HIGH (ref 7–18)
Calcium, Total: 9.5 mg/dL (ref 8.5–10.1)
Chloride: 101 mmol/L (ref 98–107)
Co2: 27 mmol/L (ref 21–32)
Creatinine: 1.53 mg/dL — ABNORMAL HIGH (ref 0.60–1.30)
EGFR (African American): 41 — ABNORMAL LOW
EGFR (Non-African Amer.): 35 — ABNORMAL LOW
Glucose: 91 mg/dL (ref 65–99)
Osmolality: 284 (ref 275–301)
Potassium: 4 mmol/L (ref 3.5–5.1)
Sodium: 139 mmol/L (ref 136–145)

## 2012-08-20 ENCOUNTER — Ambulatory Visit: Payer: Self-pay | Admitting: Pain Medicine

## 2012-08-31 ENCOUNTER — Ambulatory Visit: Payer: Self-pay | Admitting: Pain Medicine

## 2013-04-10 DIAGNOSIS — F411 Generalized anxiety disorder: Secondary | ICD-10-CM | POA: Insufficient documentation

## 2013-04-10 DIAGNOSIS — F339 Major depressive disorder, recurrent, unspecified: Secondary | ICD-10-CM | POA: Insufficient documentation

## 2013-11-18 ENCOUNTER — Emergency Department: Payer: Self-pay | Admitting: Emergency Medicine

## 2013-11-18 LAB — COMPREHENSIVE METABOLIC PANEL
Albumin: 3.8 g/dL (ref 3.4–5.0)
Alkaline Phosphatase: 81 U/L
Anion Gap: 6 — ABNORMAL LOW (ref 7–16)
BUN: 18 mg/dL (ref 7–18)
Bilirubin,Total: 0.5 mg/dL (ref 0.2–1.0)
Calcium, Total: 9.5 mg/dL (ref 8.5–10.1)
Chloride: 98 mmol/L (ref 98–107)
Co2: 29 mmol/L (ref 21–32)
Creatinine: 0.83 mg/dL (ref 0.60–1.30)
EGFR (African American): >60
EGFR (Non-African Amer.): >60
Glucose: 212 mg/dL — ABNORMAL HIGH (ref 65–99)
Osmolality: 275 (ref 275–301)
Potassium: 3.8 mmol/L (ref 3.5–5.1)
SGOT(AST): 37 U/L (ref 15–37)
SGPT (ALT): 44 U/L (ref 12–78)
Sodium: 133 mmol/L — ABNORMAL LOW (ref 136–145)
Total Protein: 7.2 g/dL (ref 6.4–8.2)

## 2013-11-18 LAB — CBC
HCT: 41.5 % (ref 35.0–47.0)
HGB: 13.9 g/dL (ref 12.0–16.0)
MCH: 28.9 pg (ref 26.0–34.0)
MCHC: 33.6 g/dL (ref 32.0–36.0)
MCV: 86 fL (ref 80–100)
Platelet: 123 10*3/uL — ABNORMAL LOW (ref 150–440)
RBC: 4.81 10*6/uL (ref 3.80–5.20)
RDW: 14.4 % (ref 11.5–14.5)
WBC: 8.6 10*3/uL (ref 3.6–11.0)

## 2013-11-18 LAB — TROPONIN I: Troponin-I: <0.02 ng/mL

## 2014-01-30 ENCOUNTER — Emergency Department: Payer: Self-pay | Admitting: Emergency Medicine

## 2014-01-30 LAB — URINALYSIS, COMPLETE
Bilirubin,UR: NEGATIVE
Glucose,UR: >=500 mg/dL (ref 0–75)
Ketone: NEGATIVE
Nitrite: NEGATIVE
Ph: 6 (ref 4.5–8.0)
Protein: NEGATIVE
RBC,UR: 525 /HPF (ref 0–5)
Specific Gravity: 1.024 (ref 1.003–1.030)
Squamous Epithelial: 1
WBC UR: 81 /HPF (ref 0–5)

## 2014-01-30 LAB — COMPREHENSIVE METABOLIC PANEL
Albumin: 3.4 g/dL (ref 3.4–5.0)
Alkaline Phosphatase: 110 U/L
Anion Gap: 4 — ABNORMAL LOW (ref 7–16)
BUN: 22 mg/dL — ABNORMAL HIGH (ref 7–18)
Bilirubin,Total: 0.4 mg/dL (ref 0.2–1.0)
Calcium, Total: 9.5 mg/dL (ref 8.5–10.1)
Chloride: 102 mmol/L (ref 98–107)
Co2: 29 mmol/L (ref 21–32)
Creatinine: 0.86 mg/dL (ref 0.60–1.30)
EGFR (African American): >60
EGFR (Non-African Amer.): >60
Glucose: 445 mg/dL — ABNORMAL HIGH (ref 65–99)
Osmolality: 293 (ref 275–301)
Potassium: 4.2 mmol/L (ref 3.5–5.1)
SGOT(AST): 20 U/L (ref 15–37)
SGPT (ALT): 35 U/L (ref 12–78)
Sodium: 135 mmol/L — ABNORMAL LOW (ref 136–145)
Total Protein: 6.6 g/dL (ref 6.4–8.2)

## 2014-01-30 LAB — CBC
HCT: 41.8 % (ref 35.0–47.0)
HGB: 13.7 g/dL (ref 12.0–16.0)
MCH: 30.3 pg (ref 26.0–34.0)
MCHC: 32.8 g/dL (ref 32.0–36.0)
MCV: 92 fL (ref 80–100)
Platelet: 125 10*3/uL — ABNORMAL LOW (ref 150–440)
RBC: 4.53 10*6/uL (ref 3.80–5.20)
RDW: 14.4 % (ref 11.5–14.5)
WBC: 7.9 10*3/uL (ref 3.6–11.0)

## 2014-02-02 LAB — URINE CULTURE

## 2014-03-11 ENCOUNTER — Encounter: Payer: Self-pay | Admitting: Podiatry

## 2014-03-11 ENCOUNTER — Ambulatory Visit (INDEPENDENT_AMBULATORY_CARE_PROVIDER_SITE_OTHER): Payer: Medicare Other | Admitting: Podiatry

## 2014-03-11 ENCOUNTER — Ambulatory Visit (INDEPENDENT_AMBULATORY_CARE_PROVIDER_SITE_OTHER): Payer: Medicare Other

## 2014-03-11 VITALS — BP 142/82 | HR 109 | Resp 16 | Ht 64.0 in | Wt 236.0 lb

## 2014-03-11 DIAGNOSIS — Q828 Other specified congenital malformations of skin: Secondary | ICD-10-CM

## 2014-03-11 DIAGNOSIS — M79609 Pain in unspecified limb: Secondary | ICD-10-CM

## 2014-03-11 DIAGNOSIS — S92919A Unspecified fracture of unspecified toe(s), initial encounter for closed fracture: Secondary | ICD-10-CM

## 2014-03-11 DIAGNOSIS — M79673 Pain in unspecified foot: Secondary | ICD-10-CM

## 2014-03-11 DIAGNOSIS — E1159 Type 2 diabetes mellitus with other circulatory complications: Secondary | ICD-10-CM

## 2014-03-11 NOTE — Progress Notes (Signed)
   Subjective:    Patient ID: [name], female    DOB: April 28, 1946, 68 y.o.   MRN: [id number]  HPI Comments: i jammed my 2nd toe about 6 weeks ago. Its very painful. i can barely walk. The toe is about the same. i think i have a wart on my 2nd toe left foot. Its been there for 6 weeks. i use lotion on my toe, and  file it down.  Foot Pain Associated symptoms include fatigue, nausea and vomiting.      Review of Systems  Constitutional: Positive for activity change and fatigue.  Eyes: Positive for pain.  Cardiovascular:       Calf pain when walking  Gastrointestinal: Positive for nausea, vomiting and constipation.  Endocrine: Positive for heat intolerance.       Excessive thirst  Musculoskeletal:       Joint pain Back pain Difficulty walking  Skin: Positive for color change.       Thick scars   Hematological: Bruises/bleeds easily.       Slow to heal  Psychiatric/Behavioral: The patient is nervous/anxious.        Objective:   Physical Exam        Assessment & Plan:

## 2014-03-13 NOTE — Progress Notes (Signed)
Subjective:     Patient ID: [name], female   DOB: 1946/07/01, 68 y.o.   MRN: [id number]  Foot Pain   patient presents with a painful toe on the left foot second toe and fifth toe secondary to injury 6 weeks ago and chronic keratotic lesion at the end of the second toe left it's been painful when pressed   Review of Systems  All other systems reviewed and are negative.      Objective:   Physical Exam  Nursing note and vitals reviewed. Constitutional: She is oriented to person, place, and time.  Cardiovascular: Intact distal pulses.   Musculoskeletal: Normal range of motion.  Neurological: She is oriented to person, place, and time.  Skin: Skin is dry.   neurovascular status found to be intact with patient having relatively shiny skin growth and digits that are well perfused. She is diminishment of muscle strength and range of motion of the subtalar midtarsal joint and is found to have edema in the fifth toe and a keratotic lesion at the end of the second toe left which is irritated and painful     Assessment:     Diabetic with possible fracture of toe and also keratotic lesion second toe left diminishment of neurological status being part of her issue    Plan:     H&P and x-rays performed and today debridement accomplished and discussed fracture toe and wearing wide tight shoe gear. Reappoint her recheck as needed

## 2014-05-16 ENCOUNTER — Ambulatory Visit: Payer: Self-pay | Admitting: Pain Medicine

## 2014-05-27 ENCOUNTER — Ambulatory Visit: Payer: Self-pay | Admitting: Pain Medicine

## 2014-06-20 ENCOUNTER — Ambulatory Visit: Payer: Self-pay | Admitting: Pain Medicine

## 2014-06-20 ENCOUNTER — Other Ambulatory Visit: Payer: Self-pay | Admitting: Pain Medicine

## 2014-06-20 DIAGNOSIS — I2510 Atherosclerotic heart disease of native coronary artery without angina pectoris: Secondary | ICD-10-CM

## 2014-06-20 DIAGNOSIS — E119 Type 2 diabetes mellitus without complications: Secondary | ICD-10-CM

## 2014-06-20 LAB — BASIC METABOLIC PANEL
Anion Gap: 13 (ref 7–16)
BUN: 15 mg/dL (ref 7–18)
Calcium, Total: 9.1 mg/dL (ref 8.5–10.1)
Chloride: 101 mmol/L (ref 98–107)
Co2: 24 mmol/L (ref 21–32)
Creatinine: 0.62 mg/dL (ref 0.60–1.30)
EGFR (African American): >60
EGFR (Non-African Amer.): >60
Glucose: 272 mg/dL — ABNORMAL HIGH (ref 65–99)
Osmolality: 286 (ref 275–301)
Potassium: 4.3 mmol/L (ref 3.5–5.1)
Sodium: 138 mmol/L (ref 136–145)

## 2014-06-20 LAB — PLATELET FUNCTION ASSAY
COL/ADP PLT FXN SCRN: 82 Seconds (ref 0–100)
COL/EPI PLT FXN SCRN: 135 Seconds

## 2014-06-20 LAB — PLATELET COUNT: Platelet: 176 10*3/uL (ref 150–440)

## 2014-06-28 ENCOUNTER — Ambulatory Visit: Payer: Self-pay | Admitting: Pain Medicine

## 2014-07-21 ENCOUNTER — Ambulatory Visit: Payer: Self-pay | Admitting: Pain Medicine

## 2014-08-10 ENCOUNTER — Ambulatory Visit: Payer: Medicare Other | Admitting: Podiatry

## 2014-08-12 ENCOUNTER — Ambulatory Visit (INDEPENDENT_AMBULATORY_CARE_PROVIDER_SITE_OTHER): Payer: Medicare Other

## 2014-08-12 ENCOUNTER — Ambulatory Visit (INDEPENDENT_AMBULATORY_CARE_PROVIDER_SITE_OTHER): Payer: Medicare Other | Admitting: Podiatry

## 2014-08-12 ENCOUNTER — Encounter: Payer: Self-pay | Admitting: Podiatry

## 2014-08-12 VITALS — BP 123/79 | HR 115 | Resp 18

## 2014-08-12 DIAGNOSIS — Q829 Congenital malformation of skin, unspecified: Secondary | ICD-10-CM

## 2014-08-12 DIAGNOSIS — E1151 Type 2 diabetes mellitus with diabetic peripheral angiopathy without gangrene: Secondary | ICD-10-CM

## 2014-08-12 DIAGNOSIS — S92912G Unspecified fracture of left toe(s), subsequent encounter for fracture with delayed healing: Secondary | ICD-10-CM

## 2014-08-12 DIAGNOSIS — L565 Disseminated superficial actinic porokeratosis (DSAP): Secondary | ICD-10-CM

## 2014-08-12 DIAGNOSIS — M779 Enthesopathy, unspecified: Secondary | ICD-10-CM

## 2014-08-12 MED ORDER — TRIAMCINOLONE ACETONIDE 10 MG/ML IJ SUSP
10.0000 mg | Freq: Once | INTRAMUSCULAR | Status: AC
  Administered 2014-08-12: 10 mg

## 2014-08-12 NOTE — Progress Notes (Signed)
Subjective:     Patient ID: [name], female   DOB: [birth year], 68 y.o.   MRN: [id number]  HPI patient states I'm a diabetic and I have a callus on the side of my right big toe and also I'm getting a lot of pain in my left second joint that I dropped something on my foot and I'm concerned I may have broken something   Review of Systems     Objective:   Physical Exam Neurovascular status intact with muscle strength adequate and keratotic lesion on the inside of the right big toe that sore when pressed and also noted to have inflammation and fluid around the second MPJ left foot    Assessment:     Inflammatory capsulitis second MPJ with trauma and painful keratotic lesion of the right big toe    Plan:     H&P and x-ray reviewed left. Did a proximal nerve block of the left second MPJ aspirated the joint.out a small amount of clear fluid and injected with half cc of dexamethasone Kenalog. For the right I debrided painful lesion that is at risk due to her long-term diabetic condition

## 2014-08-31 ENCOUNTER — Ambulatory Visit: Payer: Self-pay | Admitting: Obstetrics and Gynecology

## 2014-09-28 ENCOUNTER — Encounter: Payer: Self-pay | Admitting: *Deleted

## 2014-10-11 ENCOUNTER — Ambulatory Visit: Payer: Medicare Other | Admitting: Internal Medicine

## 2014-10-11 ENCOUNTER — Encounter: Payer: Self-pay | Admitting: Endocrinology

## 2014-10-11 ENCOUNTER — Encounter (INDEPENDENT_AMBULATORY_CARE_PROVIDER_SITE_OTHER): Payer: Self-pay

## 2014-10-11 ENCOUNTER — Ambulatory Visit (INDEPENDENT_AMBULATORY_CARE_PROVIDER_SITE_OTHER): Payer: Medicare Other | Admitting: Endocrinology

## 2014-10-11 VITALS — BP 150/92 | HR 113 | Resp 16 | Ht 65.75 in | Wt 249.5 lb

## 2014-10-11 DIAGNOSIS — F419 Anxiety disorder, unspecified: Secondary | ICD-10-CM

## 2014-10-11 DIAGNOSIS — G894 Chronic pain syndrome: Secondary | ICD-10-CM | POA: Insufficient documentation

## 2014-10-11 DIAGNOSIS — E119 Type 2 diabetes mellitus without complications: Secondary | ICD-10-CM | POA: Insufficient documentation

## 2014-10-11 DIAGNOSIS — E1169 Type 2 diabetes mellitus with other specified complication: Secondary | ICD-10-CM | POA: Insufficient documentation

## 2014-10-11 DIAGNOSIS — M549 Dorsalgia, unspecified: Secondary | ICD-10-CM | POA: Insufficient documentation

## 2014-10-11 DIAGNOSIS — IMO0002 Reserved for concepts with insufficient information to code with codable children: Secondary | ICD-10-CM | POA: Insufficient documentation

## 2014-10-11 DIAGNOSIS — E042 Nontoxic multinodular goiter: Secondary | ICD-10-CM | POA: Insufficient documentation

## 2014-10-11 DIAGNOSIS — M810 Age-related osteoporosis without current pathological fracture: Secondary | ICD-10-CM | POA: Insufficient documentation

## 2014-10-11 DIAGNOSIS — K219 Gastro-esophageal reflux disease without esophagitis: Secondary | ICD-10-CM | POA: Insufficient documentation

## 2014-10-11 DIAGNOSIS — E785 Hyperlipidemia, unspecified: Secondary | ICD-10-CM | POA: Insufficient documentation

## 2014-10-11 DIAGNOSIS — M1990 Unspecified osteoarthritis, unspecified site: Secondary | ICD-10-CM | POA: Insufficient documentation

## 2014-10-11 DIAGNOSIS — F32A Depression, unspecified: Secondary | ICD-10-CM | POA: Insufficient documentation

## 2014-10-11 DIAGNOSIS — R413 Other amnesia: Secondary | ICD-10-CM | POA: Insufficient documentation

## 2014-10-11 DIAGNOSIS — F329 Major depressive disorder, single episode, unspecified: Secondary | ICD-10-CM | POA: Insufficient documentation

## 2014-10-11 LAB — T3, FREE: T3, Free: 3.2 pg/mL (ref 2.3–4.2)

## 2014-10-11 LAB — T4, FREE: Free T4: 0.6 ng/dL (ref 0.60–1.60)

## 2014-10-11 NOTE — Progress Notes (Signed)
Pre-visit discussion using our clinic review tool. No additional management support is needed unless otherwise documented below in the visit note.  

## 2014-10-11 NOTE — Progress Notes (Signed)
Reason for  Visit-  [name] is a 68 y.o.-year-old female, referred by her ENT, Dr Richardson Landry, at [facility]. Her PCP is Lorelee Market, MD. She is here  for evaluation for Multinodular goiter.  HPI-  The patient reports being diagnosed with a goiter and thyroid nodules since ~2015.  her thyroid ultrasound was done 07/22/2014 at Dr Gala Murdoch office. It showed new right sided nodules ( Right upper , hypoechoic nodule measuring 7.2 x4.2 mm, right lower nodule hypoechoic nodule 10.3 x8.2 mm). Left thyroid showed a hypoechoic solid upper pole nodule measuring 5.5 x 5.4 mm.  Prior thyroid US was done Feb 2015. She hasn't had a FNA in the past.  Prior Thyroid US results are summarized below.   she denies a family history of thyroid disorders. The patient denies any family history of thyroid cancer or personal history of XRT to her neck area.    She reports that she has a long standing history of weight gain and fluctuations and hair loss for the preceding 5 years. Her thyroid levels have always been borderline per her report, but she hasn't been on thyroid hormone so far. Most recent TSH level drawn 2 months ago was mid normal range (3.03), with slightly low T4 level per ENT notes  (4.3). The patient reports being anxious about this appointment, because she thought she was going to have a FNA and stressed lately. Denies noticing any palpitations. HR slowed down towards end of appointment. She reports taking her medications for today am. Denies systemic symptoms.   I reviewed patient's thyroid tests:  TSH 2.8= 07/11/2008       Review of systems: [ x  ] complains of    [  ] denies [ x ] weight gain-also has peripheral edema  [ x ] constipation-chronic  [ x ] fatigue-chronic, ? Chronic pain and fibromyalgia  [  ] dry skin  [ x ] heat intolerance [ x ] hair changes-loss -chronic  [  ] weight loss [  ] tremors [  ] palpitations [  ] diarrhea [ x ] increased anxiety [  ] muscle weakness [  ]  proptosis [  ] problems with eye closure or color vision.  [  ] noticing any enlargement in size of thyroid [  ] lumps in neck [ x ] dysphagia-chokes on pills for about 1 year- may be getting slightly worse since last few months  [ x ] change in voice- feels that her hoarseness has been going on for 3 months      I have reviewed the patient's past medical history, family and social history, surgical history, medications and allergies.  Diabetes mellitus is managed by PCP.  Past Medical History  Diagnosis Date  . Hyperlipidemia   . Diabetes mellitus without complication   . Arthritis   . Osteoporosis   . Thyroid nodule   . Anxiety   . Depression   . Osteoporosis   . Cystocele   . Chronic pain syndrome   . GERD (gastroesophageal reflux disease)   . Back pain   . Obesity   . Amnesia    Past Surgical History  Procedure Laterality Date  . Appendectomy    . Cholecystectomy    . Abdominal hysterectomy    . Oophorectomy    . Replacement total knee    . Foot surgery    . Tonsillectomy     Family History  Problem Relation Age of Onset  . Cancer Mother   .  Cancer Father   . Cancer Sister   . Thyroid disease Neg Hx    History   Social History  . Marital Status: Married    Spouse Name: N/A    Number of Children: N/A  . Years of Education: N/A   Occupational History  . Not on file.   Social History Main Topics  . Smoking status: Never Smoker   . Smokeless tobacco: Current User    Types: Chew  . Alcohol Use: No  . Drug Use: Not on file  . Sexual Activity: Not on file   Other Topics Concern  . Not on file   Social History Narrative   Current Outpatient Prescriptions on File Prior to Visit  Medication Sig Dispense Refill  . clonazePAM (KLONOPIN) 2 MG tablet Take 2 mg by mouth 2 (two) times daily.     . insulin glargine (LANTUS) 100 UNIT/ML injection Inject 40 Units into the skin at bedtime.    . insulin lispro (HUMALOG) 100 UNIT/ML injection Inject into the skin  2 (two) times daily.    Marland Kitchen nystatin (MYCOSTATIN) powder     . Oxycodone HCl 20 MG TABS     . QUEtiapine (SEROQUEL) 100 MG tablet     . sitaGLIPtin (JANUVIA) 25 MG tablet Take 25 mg by mouth daily.    . Diethylpropion HCl CR 75 MG TB24     . gemfibrozil (LOPID) 600 MG tablet     . nitrofurantoin, macrocrystal-monohydrate, (MACROBID) 100 MG capsule     . sulfamethoxazole-trimethoprim (BACTRIM DS) 800-160 MG per tablet     . traZODone (DESYREL) 100 MG tablet      No current facility-administered medications on file prior to visit.   No Known Allergies   Review of Systems- Review of Systems: [x]  complains of  [  ] denies General:   [ x ] Recent weight change [ x ] Fatigue  [x  ] increased appetite Eyes: [  ]  Vision Difficulty [  ]  Eye pain ENT: [ x ]  Hearing difficulty [ x ]  Difficulty Swallowing CVS: [  ] Chest pain [  ]  Palpitations/Irregular Heart beat [  ]  Shortness of breath lying flat [ x ] Swelling of legs Resp: [ x ] Frequent Cough [x  ] Shortness of Breath  [ x ]  Wheezing GI: [ x ] Heartburn  [  ] Nausea or Vomiting  [  ] Diarrhea [  x] Constipation  [  ] Abdominal Pain GU: [ x ]  Polyuria  [ x ]  nocturia Bones/joints:  [ x ]  Muscle aches  [ x ] Joint Pain  [ x ] Bone pain Skin/Hair/Nails: [  ]  Rash  [  ] New stretch marks [ x ]  Itching [ x ] Hair loss [  ]  Excessive hair growth Reproduction: [ x ] Low sexual desire , [  ]  Women: Menstrual cycle problems [  ]  Women: Breast Discharge [  ] Men: Difficulty with erections [  ]  Men: Enlarged Breasts CNS: [  ] Frequent Headaches [  ] Blurry vision [  ] Tremors [ x ] Seizures [  ] Loss of consciousness [  ] Localized weakness Endocrine: [  ]  Excess thirst [  ]  Feeling excessively hot [  ]  Feeling excessively cold Heme: [  ]  Easy bruising [  ]  Enlarged glands or lumps in neck Allergy: [  ]  Food allergies [  ] Environmental allergies  Physical Exam- BP 150/92 mmHg  Pulse 113  Resp 16  Ht 5' 5.75" (1.67 m)  Wt 249  lb 8 oz (113.172 kg)  BMI 40.58 kg/m2  SpO2 95% Wt Readings from Last 3 Encounters:  10/11/14 249 lb 8 oz (113.172 kg)  03/11/14 236 lb (107.049 kg)    HEENT: [place]/AT, EOMI, no icterus, no proptosis, no chemosis, no mild lid lag, no retraction, eyes close completely Neck: thyroid gland - smooth, no palpable nodules, non-tender, no erythema, no tracheal deviation; negative Pemberton's sign; no lymphadenopathy; no bruits Lungs: good air entry, clear bilaterally Heart: S1&S2 normal, regular rate & rhythm; no murmurs, rubs or gallops, Pulse rechecked at 92 Abd: soft, NT, ND, no HSM, +BS Ext: no tremor in hands bilaterally, 1+ edema, 2+ DP/PT pulses, good muscle mass Neuro: normal gait, 2+ reflexes bilaterally, normal 5/5 strength, no proximal myopathy  Derm: no pretibial myxoedema/skin dryness  ASSESSMENT- 1. Multinodular goiter - thyroid U/S: Sept 2015 - Right thyroid lobe 2.55 x1 x 1cm. Right upper pole nodule -hypoechoic nodule 7.2 x 4.2 mm. Right lower pole nodule hypoechoic nodule measuring 10.3 x 8.23mm. Left thyroid lobe measures 2.75 x 1 x 1.11cm. Left upper pole nodule -solid, hypoechoic 5.5 x 5.4 mm Isthmus normal.  -Thyroid US: Feb 2015- Right lobe 2.4 x 1.16 x1.24cm. Right lobe normal.  Left lobe measures 2.16 x 0.99 x1cm. Left upper pole solid hypoechoic nodule 6.9 x5.77mm. Isthmus normal.    PLAN: 1. MNG      Problem List Items Addressed This Visit      Endocrine   Non-toxic multinodular goiter - Primary    - I reviewed the reports of her thyroid ultrasound along with the patient. We discussed that thyroid nodules are common in the population and carry a 5-15% risk of thyroid cancer. she doesn't have the risk factors for thyroid cancer and hence is at standard risk for thyroid cancer. There is no noninvasive test to differentiate between benign and malignant nodules. We discussed about thyroid nodule FNA and possible pathology reports and follow up based on these results.    - Her right lower nodule is just about meeting the criteria for FNA on one dimension ( 10.3 x 8.73mm, hypoechoic). At this stage, we could proceed with a FNA of this nodule versus close monitoring in 6 months from last thyroid US. The patient has elected to wait and watch for now and we will repeat a thyroid US 6 months from last exam. Other nodules are sub cm and could be monitored for now.  The patient is agreeable to the plan.  -At this time, I don't think she has a big goiter to explain her symptoms of choking and change in voice. She was offered laryngoscopy by ENT, which was declined by the patient.  She will monitor this symptom for now, and report back to PCP if there is worsening in this.   -Will update her  Thyroid function tests. If levels are low, then consider Levothyroxine start. However, the most recent guidelines from [employer]A don't support levothyroxine suppressive therapy for shrinkage of nodules. We discussed associated risks of TSH suppression, wrt afib, and bone loss.   -She was interested in knowing role of thyroid surgery- we discussed that she has mild compressive symptoms,which could be unrelated to the thyroid size and if her thyroid function is normal, then surgery would not be indicated.   -RTC 61months    Relevant Orders  Thyroid Profile II      T4, free      T3, free      Thyroid peroxidase antibody      -RTC 3 months  Kenza Munar [facility] 10/11/2014 11:59 AM

## 2014-10-11 NOTE — Patient Instructions (Signed)
  Labs today to check thyroid function.  If thyroid hormone levels are low, then plan to discuss starting thyroid hormone with patient.   Right sided nodules are fairly small at this time. Discussed FNA versus monitor. She has elected to monitor for now and will repeat thyroid US at next visit to assess size stability of the nodules.  If the nodules have grown, then consider FNA.  Please come back for a follow-up appointment in 6 months

## 2014-10-11 NOTE — Assessment & Plan Note (Signed)
-   I reviewed the reports of her thyroid ultrasound along with the patient. We discussed that thyroid nodules are common in the population and carry a 5-15% risk of thyroid cancer. she doesn't have the risk factors for thyroid cancer and hence is at standard risk for thyroid cancer. There is no noninvasive test to differentiate between benign and malignant nodules. We discussed about thyroid nodule FNA and possible pathology reports and follow up based on these results.   - Her right lower nodule is just about meeting the criteria for FNA on one dimension ( 10.3 x 8.70mm, hypoechoic). At this stage, we could proceed with a FNA of this nodule versus close monitoring in 6 months from last thyroid US. The patient has elected to wait and watch for now and we will repeat a thyroid US 6 months from last exam. Other nodules are sub cm and could be monitored for now.  The patient is agreeable to the plan.  -At this time, I don't think she has a big goiter to explain her symptoms of choking and change in voice. She was offered laryngoscopy by ENT, which was declined by the patient.  She will monitor this symptom for now, and report back to PCP if there is worsening in this.   -Will update her  Thyroid function tests. If levels are low, then consider Levothyroxine start. However, the most recent guidelines from [employer]A don't support levothyroxine suppressive therapy for shrinkage of nodules. We discussed associated risks of TSH suppression, wrt afib, and bone loss.   -She was interested in knowing role of thyroid surgery- we discussed that she has mild compressive symptoms,which could be unrelated to the thyroid size and if her thyroid function is normal, then surgery would not be indicated.   -RTC 34months

## 2014-10-12 ENCOUNTER — Encounter: Payer: Self-pay | Admitting: *Deleted

## 2014-10-12 LAB — THYROID PROFILE II
Free Thyroxine Index: 1.1 — ABNORMAL LOW (ref 1.2–4.9)
T3 Uptake Ratio: 28 % (ref 24–39)
T3, Total: 81 ng/dL (ref 71–180)
T4, Total: 4.1 ug/dL — ABNORMAL LOW (ref 4.5–12.0)
TSH: 2.65 u[IU]/mL (ref 0.450–4.500)

## 2014-10-12 LAB — THYROID PEROXIDASE ANTIBODY: Thyroperoxidase Ab SerPl-aCnc: 14 IU/mL — ABNORMAL HIGH (ref <9)

## 2014-10-18 ENCOUNTER — Telehealth: Payer: Self-pay | Admitting: *Deleted

## 2014-10-18 NOTE — Telephone Encounter (Signed)
-----   Message from Haydee Monica, MD sent at 10/11/2014 12:00 PM EST ----- Regarding: call pat please to let her know of change in plan I told the patient in clinic that I will see her back in 6 months and will get [place] then.  I meant to say 6 months from last thyroid US. Since that was done for 07/2014, the next follow up would be in 3 months from now. Please reschedule accordingly.   thanks

## 2014-10-18 NOTE — Telephone Encounter (Signed)
I have tried to reach patient several times by phone have been unable to contact and patient has no voicemail have sent letter to have patient reschedule to be seen in 3 months instead of 6 months.

## 2015-01-13 ENCOUNTER — Ambulatory Visit (INDEPENDENT_AMBULATORY_CARE_PROVIDER_SITE_OTHER): Payer: Medicare Other | Admitting: Podiatry

## 2015-01-13 ENCOUNTER — Ambulatory Visit (INDEPENDENT_AMBULATORY_CARE_PROVIDER_SITE_OTHER): Payer: Medicare Other

## 2015-01-13 ENCOUNTER — Encounter: Payer: Self-pay | Admitting: Podiatry

## 2015-01-13 VITALS — BP 125/85 | HR 90 | Resp 16

## 2015-01-13 DIAGNOSIS — M898X9 Other specified disorders of bone, unspecified site: Secondary | ICD-10-CM

## 2015-01-13 DIAGNOSIS — Q829 Congenital malformation of skin, unspecified: Secondary | ICD-10-CM | POA: Diagnosis not present

## 2015-01-13 DIAGNOSIS — E1151 Type 2 diabetes mellitus with diabetic peripheral angiopathy without gangrene: Secondary | ICD-10-CM | POA: Diagnosis not present

## 2015-01-13 DIAGNOSIS — L565 Disseminated superficial actinic porokeratosis (DSAP): Secondary | ICD-10-CM

## 2015-01-13 NOTE — Progress Notes (Signed)
Subjective:     Patient ID: [name], female   DOB: 19-Oct-1946, 69 y.o.   MRN: [id number]  HPI patient states I'm always scared of my feet and I have this lesion under my left foot that's been bothering me on the big toe   Review of Systems     Objective:   Physical Exam Diminishment of neurovascular status bilateral but not changed from previous visit with keratotic lesion sub-hallux left sub-hallux right with no current drainage noted but mild discomfort on the left    Assessment:     Porokeratotic type lesion with friction in a at risk diabetic    Plan:     Debridement of painful lesions bilateral and on the left I did dispensed pads to take pressure off the plantar surface

## 2015-01-20 ENCOUNTER — Ambulatory Visit: Payer: Medicare Other | Admitting: Podiatry

## 2015-01-25 DIAGNOSIS — R319 Hematuria, unspecified: Secondary | ICD-10-CM | POA: Insufficient documentation

## 2015-01-25 DIAGNOSIS — R32 Unspecified urinary incontinence: Secondary | ICD-10-CM | POA: Insufficient documentation

## 2015-02-03 ENCOUNTER — Encounter: Payer: Self-pay | Admitting: Podiatry

## 2015-02-03 ENCOUNTER — Ambulatory Visit (INDEPENDENT_AMBULATORY_CARE_PROVIDER_SITE_OTHER): Payer: Medicare Other | Admitting: Podiatry

## 2015-02-03 DIAGNOSIS — Q829 Congenital malformation of skin, unspecified: Secondary | ICD-10-CM

## 2015-02-03 DIAGNOSIS — E1151 Type 2 diabetes mellitus with diabetic peripheral angiopathy without gangrene: Secondary | ICD-10-CM

## 2015-02-03 DIAGNOSIS — L565 Disseminated superficial actinic porokeratosis (DSAP): Secondary | ICD-10-CM

## 2015-02-06 NOTE — Progress Notes (Signed)
Subjective:     Patient ID: [name], female   DOB: July 30, 1946, 69 y.o.   MRN: [id number]  HPI patient is a diabetic who presents with plantar keratosis lesion of the left hallux that is painful when pressed with long-term history of diabetes   Review of Systems     Objective:   Physical Exam Neurovascular status diminished but intact with thick keratotic lesion of the left plantar hallux that's painful when pressed and makes walking difficult. States that it at times will drain but not recently    Assessment:     Porokeratotic type lesion plantar left that becomes inflamed and painful with ambulation    Plan:     Debrided tissue and instructed on anti-inflammatories and padding for this. We dispensed pads with instructions and she will be seen back as needed for diabetic trimming and she is instructed of the risk of doing this herself

## 2015-02-25 NOTE — Op Note (Signed)
PATIENT NAME:  [name], [name] MR#:  [id number] DATE OF BIRTH:  [birth year]  DATE OF PROCEDURE:  06/28/2014  Location: Operating Room Referring Physician: Hiram Gash, MD Procedure by: Kathlen Brunswick Dossie Arbour, MD  Note: This is the case of a 69 year old morbidly obese white female patient who returns to same day surgery today for removal of her intrathecal pump, and catheter. The pump has not been working for a while, and upon physical examination in the clinic, it was clear that it was rotating within the abdominal pocket. In view of this, and the fact that x-rays revealed the catheter to be disconnected from the pump, the decision was made to bring her in to revise the device. The patient indicated that at this point she does not want to have the pump anymore and she wanted it taken out. The patient was informed of the risks and possible complications, which she accepted willingly.   Procedure(s):  1.  Removal of permanent intrathecal catheter. 2.  Removal of permanent programmable intrathecal pump.  3.  Fluoroscopic guidance to locate catheter anchor.    Anesthesia: General endotracheal anesthesia. Anesthesiologist: [employer] [facility]. Surgeon: Kathlen Brunswick. Dossie Arbour, M.D. Side of pump implant: Left side. Diagnostic Indications: Chronic low back pain; Chronic Pain Syndrome; Chronic Lumbosacral Radiculopathy/Radiculitis; Failed Back Surgery Syndrome. Malfunction of intrathecal pump with breakage/disconnection of intrathecal catheter. Prophylactic Antibiotics: Ancef (cefazolin) 1 gm. IV, 30 min before procedure. Position: Right lateral decubitus.  Prepping solution: DuraPrep Area prepped: Abdominal area. The abdominal area was prepped with a broad-spectrum topical antiseptic microbicide. Pump medication: The pump had been emptied and filled with sterile water.   Infection Control:  Standard Universal Precautions taken (Respiratory Hygiene/Cough Etiquette; Mouth, nose, eye protection; Hand  Hygiene; Personal protective equipment (PPE); safe injection practices; and use of masks and disposable sterile surgical gloves) as recommended by the Department of [place] for Disease Control and Prevention (CDC).  Safety Measures:  Allergies were reviewed. Appropriate site, procedure, and patient were confirmed by following the Joint Commission's Universal Protocol (UP.01.01.01). The patient was asked to confirm marked site and procedure, before commencing. The patient was asked about blood thinners, or active infections, both of which were denied. No attempt was made at seeking any paresthesias. Aspiration looking for blood return was conducted prior to injecting. At no point did we inject any substances, as a needle was being advanced.  Pre-procedure Assessment:  A medical history and physical exam were obtained. Relevant documentation was reviewed and verified. Prior to the procedure, the patient was provided with an Audio CD, as well as written information on the procedure, including side-effects, and possible complications. Under the influence of no sedatives, a verbal, as well as a written informed consent were obtained, after having provided information on the risks and possible complications. To fulfill our ethical and legal obligations, as recommended by the American Medical Association's Code of Ethics, we have provided information to the patient about our clinical impression; the nature and purpose of an available treatment or procedure; the risks and benefits of an available treatment or procedure; alternatives; the risk and benefits of the alternative treatment or procedure; and the risks and benefits of not receiving or undergoing a treatment or procedure. The patient was provided information about the risks and possible complications associated with the procedure. These include, but not limited to, failure to achieve desired goals, infection, bleeding, organ or nerve damage,  allergic reactions, paralysis, and death. In addition, the patient was informed that Medicine is not  an exact science; therefore, there is also the possibility of unforeseen risks and possible complications that may result in a catastrophic outcome. The patient indicated having understood very clearly.  We have given the patient no guarantees and we have made no promises. Ample time was given to the patient to ask questions, all of which were answered, to the patient's satisfaction, before proceeding. The patient understands that by signing our informed consent form, they understand and accept the risks and the fact that it is impossible to predict all possible complications. Baseline vital signs were taken and the medical assessment was completed. Verification of the correct person, correct site (including marking of site), and correct procedure were performed and confirmed by the patient.  Monitoring:  The patient was monitored in the usual manner, using NIBPM, ECG, and pulse oximetry.  Sedation:  Availability of a responsible, adult driver, and NPO status confirmed. An IV was started. Sedation provided by anesthesia team. Please see chart for dosage. Meaningful verbal contact was maintained with the patient at all times. After the procedure, the patient was sent home in stable condition, accompanied by a responsible adult driver.  Local Anesthesia:  Lidocaine 1%. The skin over the procedure site were infiltrated using a 3 ml Luer-Lok syringe with a 0.5 inch, 25-G needle. Deeper tissues were infiltrated using a 3.0 inch, 22-G spinal needle, under fluoroscopic guidance.  Description of the procedure:  Informed consent was obtained. The patient was then taken to OR #8 where she was placed in the right lateral decubitus position after having been induced with general endotracheal anesthesia. All pressure points were examined, and a beanbag system was used to hold the patient in the right lateral position. The  area was prepped, and draped in the usual manner. The incision was made over the prior back incision, and fluoroscopy was used to locate the catheter connector as well as the anchor. Once they were located, and removed the catheter was then slowly pulled out of the intrathecal space. There was a small CSF leak which was closed by using silk suture in a pursestring pattern. The area was then cleaned, and approximated with Vicryl. The skin was close with surgical staples. A small piece of Telfa was placed over the incision, and covered with an OpSite (transparent dressing).   At this point we then went to the left abdominal area where her prior incision was, and we again opened it, making an incision over the scar tissue. The pump was immediately identified, and we noticed that the refill port was looking away from the skin. I also noticed that the pump was floating freely within the pocket. Even though the pump had a "surgical sock" around it, meant to anchor the pump down with scar tissue, but this had not happened in. The pump was also noticed to not be connected to the catheter; right at the point where the plastic catheter connector enters the pump. The catheter was observed to have been torn. I also observed that the catheter within the abdominal pocket had multiple loops suggesting that this pump had been flipping a considerable amount of times, which eventually led to the catheter fatigue and breakage. The catheter was pulled, allowing it to come from the back all the way towards the front. The entire catheter was fully recovered without any problems, and no catheter was left behind. In addition, the catheter anchor, and a connector were also recovered without any kind of problems. Once the pump and the catheter had been  removed from the abdominal pocket, the pocket was then collapsed using Vicryl, and after having completely cleaned the pocket, and the incision, they were both closed, and approximated using  the Vicryl suture, and the skin subsequently closed using surgical staples. A piece of Telfa was again placed over the incision, and covered with a transparent dressing. The patient was then allowed to wake up, and extubated. She was following commands, and she was transferred to the recovery room. Neurologic evaluation revealed the patient to continue being intact.  EBL: 10 ml  Complications: No heme; no paresthesias.  Disposition: Return to clinics in 10 - 11 days for post-procedure evaluation and removal of staples.  Additional Comments/Plan: None.  Disclaimer: Medicine is not an Chief Strategy Officer. The only guarantee in medicine is that nothing is guaranteed. It is important to note that the decision to proceed with this intervention was based on the information collected from the patient. The Data and conclusions were drawn from the patient's questionnaire, the interview, and the physical examination. Because the information was provided in large part by the patient, it cannot be guaranteed that it has not been purposely or unconsciously manipulated. Every effort has been made to obtain as much relevant data as possible for this evaluation. It is important to note that the conclusions that lead to this procedure are derived in large part from the available data. Always take into account that the treatment will also be dependent on availability of resources and existing treatment guidelines, considered by other Pain Management Practitioners as being common knowledge and practice, at this time. For Medico-Legal purposes, it is also important to point out that variations in procedural techniques and pharmacological choices are the acceptable norm. The indications, contraindications, technique, and results of the above procedure should only be interpreted and judged by a Board-Certified Interventional Pain Specialist with extensive familiarity and expertise in the same exact procedure and technique, doing  otherwise would be inappropriate and unethical.  ____________________________ Kathlen Brunswick. Dossie Arbour, MD fan:nt D: 06/28/2014 16:14:00 ET T: 06/28/2014 23:04:58 ET JOB#: [id number]  cc: Markala Sitts A. Dossie Arbour, MD, <Dictator> Gaspar Cola MD ELECTRONICALLY SIGNED 06/30/2014 13:30

## 2015-04-12 ENCOUNTER — Ambulatory Visit: Payer: Medicare Other | Admitting: Endocrinology

## 2015-05-26 ENCOUNTER — Ambulatory Visit
Admission: RE | Admit: 2015-05-26 | Discharge: 2015-05-26 | Disposition: A | Discharge status: Home / Self Care | Payer: Medicare Other | Source: Ambulatory Visit | Attending: Orthopedic Surgery | Admitting: Orthopedic Surgery

## 2015-05-26 ENCOUNTER — Other Ambulatory Visit: Payer: Self-pay | Admitting: Orthopedic Surgery

## 2015-05-26 DIAGNOSIS — M25562 Pain in left knee: Secondary | ICD-10-CM

## 2015-05-26 DIAGNOSIS — M25561 Pain in right knee: Secondary | ICD-10-CM

## 2015-06-26 DIAGNOSIS — M79673 Pain in unspecified foot: Secondary | ICD-10-CM

## 2015-07-18 ENCOUNTER — Encounter: Payer: Self-pay | Admitting: Podiatry

## 2015-07-18 ENCOUNTER — Ambulatory Visit (INDEPENDENT_AMBULATORY_CARE_PROVIDER_SITE_OTHER): Payer: Medicare Other | Admitting: Podiatry

## 2015-07-18 ENCOUNTER — Ambulatory Visit (INDEPENDENT_AMBULATORY_CARE_PROVIDER_SITE_OTHER): Payer: Medicare Other

## 2015-07-18 ENCOUNTER — Ambulatory Visit: Payer: Medicare Other

## 2015-07-18 VITALS — BP 149/96 | HR 104 | Resp 18

## 2015-07-18 DIAGNOSIS — M79672 Pain in left foot: Secondary | ICD-10-CM | POA: Diagnosis not present

## 2015-07-18 DIAGNOSIS — M2040 Other hammer toe(s) (acquired), unspecified foot: Secondary | ICD-10-CM

## 2015-07-18 DIAGNOSIS — L03032 Cellulitis of left toe: Secondary | ICD-10-CM

## 2015-07-18 DIAGNOSIS — L97521 Non-pressure chronic ulcer of other part of left foot limited to breakdown of skin: Secondary | ICD-10-CM

## 2015-07-18 MED ORDER — CEPHALEXIN 500 MG PO CAPS: 500.0000 mg | ORAL_CAPSULE | Freq: Three times a day (TID) | ORAL | Status: DC

## 2015-07-19 NOTE — Progress Notes (Signed)
Patient ID: [name], female   DOB: [birth year], 69 y.o.   MRN: [id number]  Subjective: 69 year old female presents the office they for concerns of left second toe infection. She states that she has a callus the bottom of her toe for which she is an over-the-counter cream to remove it. The last couple of the issues withincreased swelling and pain to the left second toe. She states that she did not is a small amount of pus around the area previously however she has not had any recently. She states the second toe is somewhat swollen. She denies any red streaks. No other complaints at this time. She denies any systemic complaints as fevers, chills, nausea, vomiting. No calf pain, chest pain, shortness of breath.  Objective: AAO 3, NAD DP/PT pulses palpable, CRT less than 3 seconds Protective sensation decreased with Simms Weinstein monofilament Hammertoe contractures present to bilateral lesser digits. On the distal aspect left second toe there is a hyperkeratotic lesion. Upon debridement there is a very small superficial ulceration which does not probe to bone, undermining or tunneling. There is evidence of prior removal of the second third digit toenails Haller's a small amount of nail remaining in the second toe. Upon debridement there is no drainage or purulence. The nail fell off and the underlying skin is intact. There is erythema and edema to the digit extending just proximal to the PIPJ however does not extend past the MPJ. There is no areas of fluctuance or crepitus. There is no drainage or purulence. No malodor. No ascending cellulitis. There is no other areas of edema, erythema, increase in warmth. No other open lesions or pre-ulcerative lesions. No pain with calf compression, swelling, warmth, erythema.  Assessment: 69 year old female left second digit cellulitis, ulceration  Plan: -X-rays were obtained and reviewed with the patient.  -Treatment options discussed including all  alternatives, risks, and complications -Hyperkeratotic lesions/wound was debrided to the distal aspect of the second toe. Recommend antibiotic ointment and a Band-Aid daily. -Prescribed Keflex. -Left 2nd digit toenail debrided, which fell off during debridement.  -Monitor for any clinical signs or symptoms of worsening infection and directed to call the office immediately should any occur or go to the ER. -Follow-up in 2 weeks or sooner if any problems arise. In the meantime, encouraged to call the office with any questions, concerns, change in symptoms.   Celesta Gentile, DPM

## 2015-08-01 ENCOUNTER — Encounter: Payer: Self-pay | Admitting: Podiatry

## 2015-08-01 ENCOUNTER — Ambulatory Visit (INDEPENDENT_AMBULATORY_CARE_PROVIDER_SITE_OTHER): Payer: Medicare Other | Admitting: Podiatry

## 2015-08-01 VITALS — BP 124/85 | HR 116 | Resp 18

## 2015-08-01 DIAGNOSIS — L03032 Cellulitis of left toe: Secondary | ICD-10-CM | POA: Diagnosis not present

## 2015-08-01 DIAGNOSIS — L97521 Non-pressure chronic ulcer of other part of left foot limited to breakdown of skin: Secondary | ICD-10-CM | POA: Diagnosis not present

## 2015-08-01 MED ORDER — CEPHALEXIN 500 MG PO CAPS: 500.0000 mg | ORAL_CAPSULE | Freq: Three times a day (TID) | ORAL | Status: DC

## 2015-08-02 NOTE — Progress Notes (Signed)
Patient ID: [name], female   DOB: 07/03/1946, 69 y.o.   MRN: [id number]  Subjective: 69 year old female presents the office today for follow-up evaluation of left second toe cellulitis and ulceration. She states that she'll at the toenail removed and the second toe as well. She is to continue the Keflex and her redness has decreased although does continue somewhat. She denies any drainage or pus coming from the wound. Denies any pain. Denies any red streaks. No other complaints at this time no acute changes since last appointment. She denies any systemic complaints as fevers, chills, nausea, vomiting. No calf pain, chest pain, shortness of breath.  Objective: AAO 3, NAD Protective sensation decreased with Simms Weinstein monofilament DP/PT pulses are palpable, CRT less than 3 seconds There is a hyperkeratotic lesion of the distal aspect of the left second toe. After debridement there is a small granular ulceration measuring 0.2 x 0.2 cm the superficial. There is no probing, undermining, tunneling. There is no drainage or purulence coming from the ulceration. On the nail but there is a hyperkeratotic lesion and a small spicule of nail present in the lateral nail border. There is no other toenail present on the nail. There is no drainage or purulence. There is erythema extending just proximal to the PIPJ however does appear to be a decreased hue compared to last appointment. There is no areas of fluctuance or crepitus. Mild edema to the digit. No ascending cellulitis. Hammertoe contractures are present bilaterally. There are no other open lesions or pre-ulcerative lesions. There is no pain with calf compression, swelling, warmth, erythema.  Assessment: 69 year old female with resolving cellulitis however with continued erythema; toe ulceration  Plan: -Treatment options discussed including all alternatives, risks, and complications -Wound debrided hyperkeratotic periwound to healthy, bleeding,  granular wound base. Iodosorb was applied. Continue with daily dressing changes with antibiotic ointment. -Small spicule of nail was removed without complications the nail bed. There is no nail present in this time. -Continue Keflex for 7 days as this was appears to be resolving however does not believe resolved yet. -Monitor for any clinical signs or symptoms of infection and directed to call the office immediately should any occur or go to the ER. -Follow-up in 2 weeks or sooner if any problems arise. In the meantime, encouraged to call the office with any questions, concerns, change in symptoms.   Celesta Gentile, DPM

## 2015-08-15 ENCOUNTER — Ambulatory Visit: Payer: Medicare Other | Admitting: Podiatry

## 2015-08-24 ENCOUNTER — Other Ambulatory Visit: Payer: Self-pay | Admitting: Neurosurgery

## 2015-08-24 DIAGNOSIS — M4310 Spondylolisthesis, site unspecified: Secondary | ICD-10-CM

## 2015-09-05 ENCOUNTER — Ambulatory Visit (INDEPENDENT_AMBULATORY_CARE_PROVIDER_SITE_OTHER): Payer: Medicare Other | Admitting: Sports Medicine

## 2015-09-05 ENCOUNTER — Encounter: Payer: Self-pay | Admitting: Sports Medicine

## 2015-09-05 DIAGNOSIS — L89891 Pressure ulcer of other site, stage 1: Secondary | ICD-10-CM | POA: Diagnosis not present

## 2015-09-05 DIAGNOSIS — M79673 Pain in unspecified foot: Secondary | ICD-10-CM

## 2015-09-05 DIAGNOSIS — L97521 Non-pressure chronic ulcer of other part of left foot limited to breakdown of skin: Secondary | ICD-10-CM

## 2015-09-05 DIAGNOSIS — L03032 Cellulitis of left toe: Secondary | ICD-10-CM

## 2015-09-05 DIAGNOSIS — E1151 Type 2 diabetes mellitus with diabetic peripheral angiopathy without gangrene: Secondary | ICD-10-CM

## 2015-09-05 MED ORDER — AMOXICILLIN-POT CLAVULANATE 875-125 MG PO TABS: 1.0000 | ORAL_TABLET | Freq: Two times a day (BID) | ORAL | Status: DC

## 2015-09-05 NOTE — Progress Notes (Signed)
Patient ID: [name], female   DOB: 12-06-1945, 69 y.o.   MRN: [id number] Subjective: [name] is a 69 y.o. female patient seen in office for evaluation of callus/ulceration of the left 2nd toe. Patient has a history of diabetes and a blood glucose level  today of 275 mg/dl.  Patient is assisted by friend who helps her with dressing changes; has been using neosporin and bandaid once they noticed the callus area opening up again. Denies nausea/fever/vomiting/chills/night sweats/shortness of breath/pain. Patient has no other pedal complaints at this time.  HBA1C- No recent lab on file  Patient Active Problem List   Diagnosis Date Noted  . Non-toxic multinodular goiter 10/11/2014  . Diabetes mellitus ([place]) 10/11/2014  . Hyperlipidemia 10/11/2014  . Anxiety and depression 10/11/2014  . Osteoporosis 10/11/2014  . GERD (gastroesophageal reflux disease) 10/11/2014  . Arthritis 10/11/2014  . Cystocele 10/11/2014  . Chronic pain syndrome 10/11/2014  . Amnesia 10/11/2014  . Back pain 10/11/2014   Current Outpatient Prescriptions on File Prior to Visit  Medication Sig Dispense Refill  . cephALEXin (KEFLEX) 500 MG capsule Take 1 capsule (500 mg total) by mouth 3 (three) times daily. 30 capsule 2  . clonazePAM (KLONOPIN) 2 MG tablet Take 2 mg by mouth 2 (two) times daily.     . Diethylpropion HCl CR 75 MG TB24     . gemfibrozil (LOPID) 600 MG tablet     . insulin glargine (LANTUS) 100 UNIT/ML injection Inject 40 Units into the skin at bedtime.    . insulin lispro (HUMALOG) 100 UNIT/ML injection Inject into the skin 2 (two) times daily.    . nitrofurantoin, macrocrystal-monohydrate, (MACROBID) 100 MG capsule     . nystatin (MYCOSTATIN) powder     . Oxycodone HCl 20 MG TABS     . QUEtiapine (SEROQUEL) 100 MG tablet     . sitaGLIPtin (JANUVIA) 25 MG tablet Take 25 mg by mouth daily.    . traZODone (DESYREL) 100 MG tablet     . zolpidem (AMBIEN) 10 MG tablet Take 10 mg by mouth at  bedtime as needed.     No current facility-administered medications on file prior to visit.   No Known Allergies   Objective:  General: Patient is awake, alert, oriented x 3 and in no acute distress.  Dermatology: Skin is warm and dry bilateral with a partial thickness ulceration present  Left 2nd toe distal tuft at the lateral aspect. Ulceration measures 1 cm x 0.2 cm. There is a  Mild keratotic border with a granular base. The ulceration does not  probe to bone. There is no malodor, no active drainage, mild dusty erythema to level of PIPJ, no edema. No acute signs of infection.   Vascular: Dorsalis Pedis pulse = 1/4 Bilateral,  Posterior Tibial pulse = 0/4 Bilateral,  Capillary Fill Time < 5 seconds  Neurologic: Epicritic sensation diminished to the level of forefoot using  the 5.07/10g [employer]. Vibratory decreased bilateral.   Musculosketal:  No Pain with palpation to ulcerated area. No pain with compression to calves bilateral. No pain with range of motion bilateral. Hammertoe bony deformities noted bilateral, L>R.  Assessment and Plan:  Problem List Items Addressed This Visit    None    Visit Diagnoses    Ulcer of toe, left, limited to breakdown of skin (HCC)    -  Primary    Relevant Medications    amoxicillin-clavulanate (AUGMENTIN) 875-125 MG tablet    Cellulitis of toe  of left foot        Relevant Medications    amoxicillin-clavulanate (AUGMENTIN) 875-125 MG tablet    Diabetic peripheral vascular disorder (HCC)        Foot pain, unspecified laterality          -Examined patient and discussed the progression of the wound and treatment alternatives. -Previous xrays reviewed  - Excisionally dedbrided ulceration to healthy bleeding borders using a sterile chisel blade. -Applied iodosorb and bandaid and instructed patient to continue with daily dressings at home consisting of topical antibiotic and bandaid/dry sterile dressing. Rx Augmentin 875/125  mg PO bid. -Dispensed silicone toe protector - Advised patient to go to the ER or return to office if the wound worsens or if constitutional symptoms are present. -Patient to return to office in 2 weeks for follow up care and evaluation or sooner if problems arise. Will consider repeat xray if fails to improve.   [name], DPM

## 2015-09-12 ENCOUNTER — Ambulatory Visit
Admission: RE | Admit: 2015-09-12 | Discharge: 2015-09-12 | Disposition: A | Discharge status: Home / Self Care | Payer: Medicare Other | Source: Ambulatory Visit | Attending: Neurosurgery | Admitting: Neurosurgery

## 2015-09-12 DIAGNOSIS — M4310 Spondylolisthesis, site unspecified: Secondary | ICD-10-CM

## 2015-09-12 DIAGNOSIS — M4807 Spinal stenosis, lumbosacral region: Secondary | ICD-10-CM | POA: Insufficient documentation

## 2015-09-12 DIAGNOSIS — M4856XA Collapsed vertebra, not elsewhere classified, lumbar region, initial encounter for fracture: Secondary | ICD-10-CM | POA: Insufficient documentation

## 2015-09-13 ENCOUNTER — Other Ambulatory Visit (HOSPITAL_COMMUNITY): Payer: Self-pay | Admitting: Neurosurgery

## 2015-09-22 ENCOUNTER — Ambulatory Visit: Payer: Medicare Other | Admitting: Sports Medicine

## 2015-10-02 ENCOUNTER — Telehealth: Payer: Self-pay | Admitting: Obstetrics and Gynecology

## 2015-10-02 NOTE — Telephone Encounter (Signed)
Pt called.Marland Kitchenand she stated that the oxybuton is working but it is making her mouth and throat very dry, and she went to another provider today and that provider to told her to call here and let you know to see if maybe it can be cahnged or if the dose can be change, but pt stated its working just having that side effect..she uses [facility]

## 2015-10-03 ENCOUNTER — Telehealth: Payer: Self-pay | Admitting: Obstetrics and Gynecology

## 2015-10-03 MED ORDER — TOLTERODINE TARTRATE ER 4 MG PO CP24: 4.0000 mg | ORAL_CAPSULE | Freq: Every day | ORAL | Status: DC

## 2015-10-03 NOTE — Telephone Encounter (Signed)
DONE

## 2015-10-03 NOTE — Telephone Encounter (Signed)
Dr Marcelline Mates was supposed to sent something to the pharmacy to replace oxyburin. It is not there.  [facility] [address]

## 2015-10-03 NOTE — Telephone Encounter (Signed)
Dr.cherry per this pt's last telephone note you were going to send in Detrol to replace oxybutin. Pt states that it is not at the pharmacy. What dosage do you want pt to have?

## 2015-10-03 NOTE — Telephone Encounter (Signed)
Please inform patient that I have switched her to a medication called Detrol, should have less side effects.  She should start with 1/2 tablet daily x 1 week.  If symptoms controlled, continue on this dose.  If no improvement in symptoms, can increase to 1 whole tablet daily.

## 2015-10-06 ENCOUNTER — Inpatient Hospital Stay (HOSPITAL_COMMUNITY): Admission: RE | Admit: 2015-10-06 | Payer: Medicare Other | Source: Ambulatory Visit

## 2015-10-06 DIAGNOSIS — R002 Palpitations: Secondary | ICD-10-CM | POA: Insufficient documentation

## 2015-10-06 DIAGNOSIS — R0602 Shortness of breath: Secondary | ICD-10-CM | POA: Insufficient documentation

## 2015-10-06 DIAGNOSIS — I429 Cardiomyopathy, unspecified: Secondary | ICD-10-CM | POA: Insufficient documentation

## 2015-10-10 ENCOUNTER — Telehealth: Payer: Self-pay | Admitting: *Deleted

## 2015-10-10 ENCOUNTER — Ambulatory Visit (INDEPENDENT_AMBULATORY_CARE_PROVIDER_SITE_OTHER): Payer: Medicare Other | Admitting: Sports Medicine

## 2015-10-10 ENCOUNTER — Encounter: Payer: Self-pay | Admitting: Sports Medicine

## 2015-10-10 DIAGNOSIS — L03032 Cellulitis of left toe: Secondary | ICD-10-CM

## 2015-10-10 DIAGNOSIS — M79672 Pain in left foot: Secondary | ICD-10-CM | POA: Diagnosis not present

## 2015-10-10 DIAGNOSIS — L97521 Non-pressure chronic ulcer of other part of left foot limited to breakdown of skin: Secondary | ICD-10-CM

## 2015-10-10 DIAGNOSIS — M898X9 Other specified disorders of bone, unspecified site: Secondary | ICD-10-CM

## 2015-10-10 MED ORDER — SULFAMETHOXAZOLE-TRIMETHOPRIM 800-160 MG PO TABS: 1.0000 | ORAL_TABLET | Freq: Two times a day (BID) | ORAL | Status: DC

## 2015-10-10 NOTE — Telephone Encounter (Signed)
Pt states she was given a shoe to wear and it hurts her back, is she to wear it all the time.  I told to keep the shoe on at all times while weight bearing, to wear a shoe on the opposite foot that is the same sole/heel height, and to rest that is part of offloading an ulcer also.

## 2015-10-10 NOTE — Progress Notes (Signed)
Patient ID: [name], female   DOB: [birth year], 69 y.o.   MRN: [id number]  Subjective: [name] is a 69 y.o. female patient seen in office for evaluation of ulceration of the left 2nd toe. Patient has a history of diabetes and a blood glucose level today of 260 mg/dl.  Patient is assisted by friend who helps her with dressing changes; has been using neosporin and bandaid. Denies nausea/fever/vomiting/chills/night sweats/shortness of breath/pain. Patient has no other pedal complaints at this time.  HBA1C- No recent lab on file  Patient Active Problem List   Diagnosis Date Noted  . Non-toxic multinodular goiter 10/11/2014  . Diabetes mellitus ([place]) 10/11/2014  . Hyperlipidemia 10/11/2014  . Anxiety and depression 10/11/2014  . Osteoporosis 10/11/2014  . GERD (gastroesophageal reflux disease) 10/11/2014  . Arthritis 10/11/2014  . Cystocele 10/11/2014  . Chronic pain syndrome 10/11/2014  . Amnesia 10/11/2014  . Back pain 10/11/2014   Current Outpatient Prescriptions on File Prior to Visit  Medication Sig Dispense Refill  . amoxicillin-clavulanate (AUGMENTIN) 875-125 MG tablet Take 1 tablet by mouth 2 (two) times daily. 20 tablet 0  . cephALEXin (KEFLEX) 500 MG capsule Take 1 capsule (500 mg total) by mouth 3 (three) times daily. 30 capsule 2  . clonazePAM (KLONOPIN) 2 MG tablet Take 2 mg by mouth 2 (two) times daily.     . Diethylpropion HCl CR 75 MG TB24     . gemfibrozil (LOPID) 600 MG tablet     . insulin glargine (LANTUS) 100 UNIT/ML injection Inject 40 Units into the skin at bedtime.    . insulin lispro (HUMALOG) 100 UNIT/ML injection Inject into the skin 2 (two) times daily.    . nitrofurantoin, macrocrystal-monohydrate, (MACROBID) 100 MG capsule     . nystatin (MYCOSTATIN) powder     . Oxycodone HCl 20 MG TABS     . QUEtiapine (SEROQUEL) 100 MG tablet     . sitaGLIPtin (JANUVIA) 25 MG tablet Take 25 mg by mouth daily.    Marland Kitchen tolterodine (DETROL LA) 4 MG 24 hr capsule  Take 1 capsule (4 mg total) by mouth daily. Start with 1/2 tablet daily x 1 week, then increase to whole tablet if needed. 30 capsule 12  . traZODone (DESYREL) 100 MG tablet     . zolpidem (AMBIEN) 10 MG tablet Take 10 mg by mouth at bedtime as needed.     No current facility-administered medications on file prior to visit.   No Known Allergies   Objective:  General: Patient is awake, alert, oriented x 3 and in no acute distress.  Dermatology: Skin is warm and dry bilateral with a partial thickness ulceration present  Left 2nd toe distal tuft at the lateral aspect. Ulceration measures 1 cm x 0.3cm (last measurement 1 cm x 0.2 cm). There is a macerated border with a granular base. The ulceration does not  probe to bone. There is no malodor, no active drainage, mild dusty erythema to level of PIPJ, no edema. No other acute signs of infection.   Vascular: Dorsalis Pedis pulse = 1/4 Bilateral,  Posterior Tibial pulse = 0/4 Bilateral,  Capillary Fill Time < 5 seconds  Neurologic: Epicritic sensation diminished to the level of forefoot using the 5.07/10g [employer]. Vibratory decreased bilateral.   Musculosketal:  No Pain with palpation to ulcerated area. No pain with compression to calves bilateral. No pain with range of motion bilateral. Hammertoe bony deformities noted bilateral, L>R with exostosis at left hallux.  Assessment  and Plan:  Problem List Items Addressed This Visit    None    Visit Diagnoses    Ulcer of toe, left, limited to breakdown of skin ([place])    -  Primary    2nd toe    Cellulitis of toe of left foot        2nd toe    Exostosis        Left hallux doing better with silicone toe pad     Left foot pain          -Examined patient and discussed the progression of the wound and treatment alternatives. -Previous xrays reviewed  - Excisionally dedbrided ulceration to healthy bleeding borders using a sterile chisel blade. -Applied iodosorb and bandaid  and instructed patient to continue with daily dressings at home consisting of topical antibiotic and bandaid/dry sterile dressing. Advised patient that daily dressing changes are necessary to evaluate the wound and to assist with wound healing. - Rx  Bactrim PO 10 days. -Dispensed postop shoe and advised patient that in order for wound heel appropriate offloading and appropriate shoes must be worn -Gave patient a replacement silicone toe protector for left hallux - Advised patient to go to the ER or return to office if the wound worsens or if constitutional symptoms are present. -Patient to return to office in 1-2 weeks for follow up care and evaluation or sooner if problems arise. Will consider repeat xray if fails to improve.   [name], DPM

## 2015-10-16 ENCOUNTER — Telehealth: Payer: Self-pay | Admitting: Obstetrics and Gynecology

## 2015-10-16 ENCOUNTER — Encounter (HOSPITAL_COMMUNITY): Admission: RE | Payer: Self-pay | Source: Ambulatory Visit

## 2015-10-16 ENCOUNTER — Inpatient Hospital Stay (HOSPITAL_COMMUNITY): Admission: RE | Admit: 2015-10-16 | Payer: Medicare Other | Source: Ambulatory Visit | Admitting: Neurosurgery

## 2015-10-16 SURGERY — POSTERIOR LUMBAR FUSION 1 LEVEL
Anesthesia: General | Site: Back

## 2015-10-16 NOTE — Telephone Encounter (Signed)
PHARMACY CALLED [facility]- ABOUT RX FROM 10/03/15, DIRECTIONS SAID TO TAKE 1 CAPSULE DAILY BUT TO HALF IT AND YOU CAN HALF A CAPSULE SO THEY WANTED CLARIFICATION FOR THIS MEDICINE, NOT SURE WHICH MED IT WAS. PHARMACY NUMBER [phone].

## 2015-10-17 NOTE — Telephone Encounter (Signed)
Called pharmacy advised them that the sig needed to be changed to one per day per pt's insurance. Spoke with [name], change made.

## 2015-10-20 ENCOUNTER — Ambulatory Visit: Payer: Medicare Other

## 2015-10-20 ENCOUNTER — Ambulatory Visit (INDEPENDENT_AMBULATORY_CARE_PROVIDER_SITE_OTHER): Payer: Medicare Other | Admitting: Sports Medicine

## 2015-10-20 DIAGNOSIS — L03032 Cellulitis of left toe: Secondary | ICD-10-CM | POA: Diagnosis not present

## 2015-10-20 DIAGNOSIS — L97521 Non-pressure chronic ulcer of other part of left foot limited to breakdown of skin: Secondary | ICD-10-CM | POA: Diagnosis not present

## 2015-10-20 DIAGNOSIS — M79672 Pain in left foot: Secondary | ICD-10-CM

## 2015-10-20 DIAGNOSIS — E1151 Type 2 diabetes mellitus with diabetic peripheral angiopathy without gangrene: Secondary | ICD-10-CM | POA: Diagnosis not present

## 2015-10-20 NOTE — Progress Notes (Signed)
Patient ID: [name], female   DOB: 06-Sep-1946, 69 y.o.   MRN: [id number]  Subjective: [name] is a 69 y.o. female patient seen in office for follow up evaluation of ulceration of the left 2nd toe. Patient has a history of diabetes and a blood glucose level today of 240mg /dl.  Patient is assisted by friend who helps her with dressing changes; has been using neosporin and bandaid. Denies nausea/fever/vomiting/chills/night sweats/shortness of breath/pain. Patient has no other pedal complaints at this time.  HBA1C- No recent lab on file  Patient Active Problem List   Diagnosis Date Noted  . Non-toxic multinodular goiter 10/11/2014  . Diabetes mellitus ([place]) 10/11/2014  . Hyperlipidemia 10/11/2014  . Anxiety and depression 10/11/2014  . Osteoporosis 10/11/2014  . GERD (gastroesophageal reflux disease) 10/11/2014  . Arthritis 10/11/2014  . Cystocele 10/11/2014  . Chronic pain syndrome 10/11/2014  . Amnesia 10/11/2014  . Back pain 10/11/2014   Current Outpatient Prescriptions on File Prior to Visit  Medication Sig Dispense Refill  . amoxicillin-clavulanate (AUGMENTIN) 875-125 MG tablet Take 1 tablet by mouth 2 (two) times daily. 20 tablet 0  . cephALEXin (KEFLEX) 500 MG capsule Take 1 capsule (500 mg total) by mouth 3 (three) times daily. 30 capsule 2  . clonazePAM (KLONOPIN) 2 MG tablet Take 2 mg by mouth 2 (two) times daily.     . Diethylpropion HCl CR 75 MG TB24     . gemfibrozil (LOPID) 600 MG tablet     . insulin glargine (LANTUS) 100 UNIT/ML injection Inject 40 Units into the skin at bedtime.    . insulin lispro (HUMALOG) 100 UNIT/ML injection Inject into the skin 2 (two) times daily.    . nitrofurantoin, macrocrystal-monohydrate, (MACROBID) 100 MG capsule     . nystatin (MYCOSTATIN) powder     . Oxycodone HCl 20 MG TABS     . QUEtiapine (SEROQUEL) 100 MG tablet     . sitaGLIPtin (JANUVIA) 25 MG tablet Take 25 mg by mouth daily.    Marland Kitchen sulfamethoxazole-trimethoprim  (BACTRIM DS,SEPTRA DS) 800-160 MG tablet Take 1 tablet by mouth 2 (two) times daily. 28 tablet 1  . tolterodine (DETROL LA) 4 MG 24 hr capsule Take 1 capsule (4 mg total) by mouth daily. Start with 1/2 tablet daily x 1 week, then increase to whole tablet if needed. 30 capsule 12  . traZODone (DESYREL) 100 MG tablet     . zolpidem (AMBIEN) 10 MG tablet Take 10 mg by mouth at bedtime as needed.     No current facility-administered medications on file prior to visit.   No Known Allergies   Objective:  General: Patient is awake, alert, oriented x 3 and in no acute distress.  Dermatology: Skin is warm and dry bilateral with ulceration that was present at Left 2nd toe distal tuft at the lateral aspect is now prematurely closed.  There is no surrounding signs of infection, mild dusty erythema to level of PIPJ that is improving since last visit, no edema. No other acute signs of infection.   Vascular: Dorsalis Pedis pulse = 1/4 Bilateral,  Posterior Tibial pulse = 0/4 Bilateral,  Capillary Fill Time < 5 seconds  Neurologic: Epicritic sensation diminished to the level of forefoot using the 5.07/10g [employer]. Vibratory decreased bilateral.   Musculosketal:  No Pain with palpation to left 2nd toe. No pain with compression to calves bilateral. No pain with range of motion bilateral. Hammertoe bony deformities noted bilateral, L>R with exostosis at left  hallux.  Xray, Left foot: For area of concern at Left 2nd toe there is no underlying bone destruction/fracture/dislocation. No other acute pathology at this time.   Assessment and Plan:  Problem List Items Addressed This Visit    None    Visit Diagnoses    Left foot pain    -  Primary    Relevant Orders    DG Foot 2 Views Left    Ulcer of toe, left, limited to breakdown of skin ([facility])        2nd toe prematurely closed    Cellulitis of toe of left foot        Improving    Diabetic peripheral vascular disorder ([place])           -Examined patient and discussed the progression of the wound and treatment alternatives. -Xrays reviewed  - Applied neosporin and bandaid. Patient to continue with same at home to offer additional protection to the toe since skin is newly closed.  - Cont bactrim until complete  -Patient may use normal shoe -At next encounter will recheck and if remains closed will give silicone toe pain and possibly order diabetic shoes for preventative measures - Advised patient to go to the ER or return to office if the wound worsens or if constitutional symptoms are present. -Patient to return to office in 1-2 weeks for follow up care and evaluation or sooner if problems arise.   [name], DPM

## 2015-10-31 ENCOUNTER — Encounter: Payer: Self-pay | Admitting: Sports Medicine

## 2015-10-31 ENCOUNTER — Ambulatory Visit (INDEPENDENT_AMBULATORY_CARE_PROVIDER_SITE_OTHER): Payer: Medicare Other | Admitting: Sports Medicine

## 2015-10-31 DIAGNOSIS — R58 Hemorrhage, not elsewhere classified: Secondary | ICD-10-CM | POA: Diagnosis not present

## 2015-10-31 DIAGNOSIS — L97521 Non-pressure chronic ulcer of other part of left foot limited to breakdown of skin: Secondary | ICD-10-CM | POA: Diagnosis not present

## 2015-10-31 DIAGNOSIS — M79673 Pain in unspecified foot: Secondary | ICD-10-CM | POA: Diagnosis not present

## 2015-10-31 DIAGNOSIS — E1151 Type 2 diabetes mellitus with diabetic peripheral angiopathy without gangrene: Secondary | ICD-10-CM

## 2015-10-31 DIAGNOSIS — M898X9 Other specified disorders of bone, unspecified site: Secondary | ICD-10-CM

## 2015-10-31 NOTE — Progress Notes (Signed)
Patient ID: [name], female   DOB: [birth year], 69 y.o.   MRN: [id number]  Subjective: [name] is a 69 y.o. female patient seen in office for follow up evaluation of ulceration of the left 2nd toe. Patient has a history of diabetes and a blood glucose level today of 200mg /dl.  Patient is assisted by friend who helps her with dressing changes; has been using neosporin and bandaid. Denies nausea/fever/vomiting/chills/night sweats/shortness of breath/pain. Patient states that she dropped a frozen [place] on her right foot; declined xray and further work up. Patient has no other pedal complaints at this time.  HBA1C- No recent lab on file  Patient Active Problem List   Diagnosis Date Noted  . Non-toxic multinodular goiter 10/11/2014  . Diabetes mellitus ([place]) 10/11/2014  . Hyperlipidemia 10/11/2014  . Anxiety and depression 10/11/2014  . Osteoporosis 10/11/2014  . GERD (gastroesophageal reflux disease) 10/11/2014  . Arthritis 10/11/2014  . Cystocele 10/11/2014  . Chronic pain syndrome 10/11/2014  . Amnesia 10/11/2014  . Back pain 10/11/2014   Current Outpatient Prescriptions on File Prior to Visit  Medication Sig Dispense Refill  . amoxicillin-clavulanate (AUGMENTIN) 875-125 MG tablet Take 1 tablet by mouth 2 (two) times daily. 20 tablet 0  . cephALEXin (KEFLEX) 500 MG capsule Take 1 capsule (500 mg total) by mouth 3 (three) times daily. 30 capsule 2  . clonazePAM (KLONOPIN) 2 MG tablet Take 2 mg by mouth 2 (two) times daily.     . Diethylpropion HCl CR 75 MG TB24     . gemfibrozil (LOPID) 600 MG tablet     . insulin glargine (LANTUS) 100 UNIT/ML injection Inject 40 Units into the skin at bedtime.    . insulin lispro (HUMALOG) 100 UNIT/ML injection Inject into the skin 2 (two) times daily.    . nitrofurantoin, macrocrystal-monohydrate, (MACROBID) 100 MG capsule     . nystatin (MYCOSTATIN) powder     . Oxycodone HCl 20 MG TABS     . QUEtiapine (SEROQUEL) 100 MG tablet     .  sitaGLIPtin (JANUVIA) 25 MG tablet Take 25 mg by mouth daily.    Marland Kitchen sulfamethoxazole-trimethoprim (BACTRIM DS,SEPTRA DS) 800-160 MG tablet Take 1 tablet by mouth 2 (two) times daily. 28 tablet 1  . tolterodine (DETROL LA) 4 MG 24 hr capsule Take 1 capsule (4 mg total) by mouth daily. Start with 1/2 tablet daily x 1 week, then increase to whole tablet if needed. 30 capsule 12  . traZODone (DESYREL) 100 MG tablet     . zolpidem (AMBIEN) 10 MG tablet Take 10 mg by mouth at bedtime as needed.     No current facility-administered medications on file prior to visit.   No Known Allergies   Objective:  General: Patient is awake, alert, oriented x 3 and in no acute distress.  Dermatology: Skin is warm and dry bilateral with ulceration that was present at Left 2nd toe distal tuft at the lateral aspect is now prematurely closed.  There is no surrounding signs of infection, mild dusty erythema to level of PIPJ that is now resolve since last visit, no edema. No other acute signs of infection. There is ecchymosis and mild swelling to right 1,2,3rd toes with the most involved toe being the right 2nd toe with no opening.    Vascular: Dorsalis Pedis pulse = 1/4 Bilateral,  Posterior Tibial pulse = 0/4 Bilateral,  Capillary Fill Time < 5 seconds  Neurologic: Epicritic sensation diminished to the level of forefoot using the  5.07/10g [employer] Monofilament. Vibratory decreased bilateral.   Musculosketal:  No Pain with palpation to left 2nd toe. There is mild tenderness with palpation to right 2nd toe. No pain with compression to calves bilateral. No pain with range of motion bilateral. Hammertoe bony deformities noted bilateral, L>R with exostosis at left hallux.  Assessment and Plan:  Problem List Items Addressed This Visit    None    Visit Diagnoses    Ulcer of toe, left, limited to breakdown of skin ([place])    -  Primary    Prematurely closed, 2nd toe    Foot pain, unspecified laterality         Exostosis        Diabetic peripheral vascular disorder ([place])        Ecchymosis        Right 1,2,3rd toes with 2nd toe worse secondary to injury; dropped [place] on foot; declined xray or further work up      -Examined patient and discussed plan of care -Patient declined xray at right foot - No more dressings are needed at left 2nd toe; area healed; Gave patient silicone toe protector for left 2nd toe and silo pad for left hallux exostosis.  -Patient may cont with normal shoes; patient declined Rx for diabetic shoes. Advised to wear appropriate fitting shoes to prevent recurrent ulceration -Advised to monitor and check feet daily for recurrence and for signs of infection  -Patient to return to office in 4 weeks for follow up care re-check ecchymosis of toes on and closed ulcer site/ evaluation on left or sooner if problems arise.   [name], DPM

## 2015-11-17 ENCOUNTER — Telehealth: Payer: Self-pay | Admitting: Obstetrics and Gynecology

## 2015-11-17 NOTE — Telephone Encounter (Signed)
Please advise pt that she should call her insurance company and find out what drug in they will pay for as we have completed a prior auth for this medication before, if she doesn't call and find out what they will cover it will just continue to be a guessing game.

## 2015-11-17 NOTE — Telephone Encounter (Signed)
Patient called stating her script for tolterodine is too expensive and wanted to know if something cheaper can be called in. [facility] [address]Thanks

## 2015-11-20 ENCOUNTER — Telehealth: Payer: Self-pay | Admitting: Obstetrics and Gynecology

## 2015-11-20 DIAGNOSIS — R351 Nocturia: Secondary | ICD-10-CM

## 2015-11-20 NOTE — Telephone Encounter (Signed)
Pt cannot afford medication sent to pharmacy for bladder its $89.00. Can something else be sent please.....[facility] [address]

## 2015-11-21 ENCOUNTER — Telehealth: Payer: Self-pay | Admitting: Obstetrics and Gynecology

## 2015-11-21 MED ORDER — OXYBUTYNIN CHLORIDE ER 5 MG PO TB24: 5.0000 mg | ORAL_TABLET | Freq: Every day | ORAL | Status: DC

## 2015-11-21 NOTE — Telephone Encounter (Signed)
[name] will be calling the patient back.  We don't know what her insurance will and will not cover, so she would likely need to speak with her insurance company to see what recommended medications they would pay for so that we don't continue to call in medications that are not affordable.

## 2015-11-21 NOTE — Telephone Encounter (Signed)
PT CALLED AND SHE WAS PUT ON THE FIRST MEDICATION FOR HER BLADDER TO BE CONTROLED AND IT DRIED HER MOUTH OUT REALLY BAD, THEN SHE WAS PUT ON ANOTHER MEDICATION TOLTERODINE AND THAT WORKED REALLY WELL FOR HER, BUT SINCE THE NEW YEAR STARTED IT COST 85.00 AND SHE DOES NOT HAVE THAT, SO SHE WANTED TO KNOW IF THERE WAS SOMETHING ELSE SIMILAR TO THAT ONE WITH LESS COST OR NOT, AND SHE DID STATE THAT IF SHE NEEDED TO CAN GO BACK TO THE FIRST ONE THAT DRIED HER MOUTH OUT.

## 2015-11-21 NOTE — Telephone Encounter (Signed)
PLEASE ADVISE.

## 2015-11-21 NOTE — Telephone Encounter (Signed)
Called pt she states that she cannot afford medication that was sent in (tolterodoine) and wishes to go back on Oxybutyin which was more cost effective but caused dry mouth. Pt states she can tolerate dry mouth. RX sent in for Oxybutin.

## 2015-12-05 ENCOUNTER — Other Ambulatory Visit (HOSPITAL_COMMUNITY): Payer: Self-pay | Admitting: Neurosurgery

## 2015-12-05 ENCOUNTER — Ambulatory Visit: Payer: Medicare Other | Admitting: Sports Medicine

## 2015-12-20 ENCOUNTER — Other Ambulatory Visit: Payer: Self-pay | Admitting: Neurosurgery

## 2015-12-22 ENCOUNTER — Encounter (HOSPITAL_COMMUNITY)
Admission: RE | Admit: 2015-12-22 | Discharge: 2015-12-22 | Disposition: A | Discharge status: Home / Self Care | Payer: Medicare Other | Source: Ambulatory Visit | Attending: Neurosurgery | Admitting: Neurosurgery

## 2015-12-22 ENCOUNTER — Encounter (HOSPITAL_COMMUNITY): Payer: Self-pay

## 2015-12-22 DIAGNOSIS — K219 Gastro-esophageal reflux disease without esophagitis: Secondary | ICD-10-CM | POA: Diagnosis not present

## 2015-12-22 DIAGNOSIS — E119 Type 2 diabetes mellitus without complications: Secondary | ICD-10-CM | POA: Diagnosis not present

## 2015-12-22 DIAGNOSIS — Z794 Long term (current) use of insulin: Secondary | ICD-10-CM | POA: Insufficient documentation

## 2015-12-22 DIAGNOSIS — Z01818 Encounter for other preprocedural examination: Secondary | ICD-10-CM | POA: Diagnosis not present

## 2015-12-22 DIAGNOSIS — I509 Heart failure, unspecified: Secondary | ICD-10-CM | POA: Insufficient documentation

## 2015-12-22 DIAGNOSIS — I252 Old myocardial infarction: Secondary | ICD-10-CM | POA: Diagnosis not present

## 2015-12-22 DIAGNOSIS — E785 Hyperlipidemia, unspecified: Secondary | ICD-10-CM | POA: Insufficient documentation

## 2015-12-22 DIAGNOSIS — G4733 Obstructive sleep apnea (adult) (pediatric): Secondary | ICD-10-CM | POA: Insufficient documentation

## 2015-12-22 DIAGNOSIS — Z01812 Encounter for preprocedural laboratory examination: Secondary | ICD-10-CM | POA: Insufficient documentation

## 2015-12-22 DIAGNOSIS — Z0183 Encounter for blood typing: Secondary | ICD-10-CM | POA: Diagnosis not present

## 2015-12-22 DIAGNOSIS — Z79899 Other long term (current) drug therapy: Secondary | ICD-10-CM | POA: Diagnosis not present

## 2015-12-22 DIAGNOSIS — M4310 Spondylolisthesis, site unspecified: Secondary | ICD-10-CM | POA: Insufficient documentation

## 2015-12-22 HISTORY — DX: Heart failure, unspecified: I50.9

## 2015-12-22 HISTORY — DX: Acute myocardial infarction, unspecified: I21.9

## 2015-12-22 HISTORY — DX: Reserved for inherently not codable concepts without codable children: IMO0001

## 2015-12-22 HISTORY — DX: Sleep apnea, unspecified: G47.30

## 2015-12-22 HISTORY — DX: Irritable bowel syndrome, unspecified: K58.9

## 2015-12-22 LAB — BASIC METABOLIC PANEL
Anion gap: 14 (ref 5–15)
BUN: 10 mg/dL (ref 6–20)
CO2: 22 mmol/L (ref 22–32)
Calcium: 9.4 mg/dL (ref 8.9–10.3)
Chloride: 99 mmol/L — ABNORMAL LOW (ref 101–111)
Creatinine, Ser: 0.64 mg/dL (ref 0.44–1.00)
GFR calc Af Amer: >60 mL/min (ref >60)
GFR calc non Af Amer: >60 mL/min (ref >60)
Glucose, Bld: 313 mg/dL — ABNORMAL HIGH (ref 65–99)
Potassium: 4.2 mmol/L (ref 3.5–5.1)
Sodium: 135 mmol/L (ref 135–145)

## 2015-12-22 LAB — CBC WITH DIFFERENTIAL/PLATELET
Basophils Absolute: 0.1 10*3/uL (ref 0.0–0.1)
Basophils Relative: 1 %
Eosinophils Absolute: 0.7 10*3/uL (ref 0.0–0.7)
Eosinophils Relative: 10 %
HCT: 43.4 % (ref 36.0–46.0)
Hemoglobin: 14.6 g/dL (ref 12.0–15.0)
Lymphocytes Relative: 35 %
Lymphs Abs: 2.4 10*3/uL (ref 0.7–4.0)
MCH: 29.7 pg (ref 26.0–34.0)
MCHC: 33.6 g/dL (ref 30.0–36.0)
MCV: 88.4 fL (ref 78.0–100.0)
Monocytes Absolute: 0.4 10*3/uL (ref 0.1–1.0)
Monocytes Relative: 6 %
Neutro Abs: 3.4 10*3/uL (ref 1.7–7.7)
Neutrophils Relative %: 48 %
Platelets: 149 10*3/uL — ABNORMAL LOW (ref 150–400)
RBC: 4.91 MIL/uL (ref 3.87–5.11)
RDW: 14 % (ref 11.5–15.5)
WBC: 6.9 10*3/uL (ref 4.0–10.5)

## 2015-12-22 LAB — TYPE AND SCREEN
ABO/RH(D): B POS
Antibody Screen: NEGATIVE

## 2015-12-22 LAB — GLUCOSE, CAPILLARY: Glucose-Capillary: 334 mg/dL — ABNORMAL HIGH (ref 65–99)

## 2015-12-22 LAB — SURGICAL PCR SCREEN
MRSA, PCR: NEGATIVE
Staphylococcus aureus: NEGATIVE

## 2015-12-22 MED ORDER — CEFAZOLIN SODIUM-DEXTROSE 2-3 GM-% IV SOLR: 2.0000 g | INTRAVENOUS | Status: DC

## 2015-12-22 MED ORDER — DEXAMETHASONE SODIUM PHOSPHATE 10 MG/ML IJ SOLN: 10.0000 mg | INTRAMUSCULAR | Status: DC

## 2015-12-22 NOTE — Progress Notes (Signed)
Past cardiac records requested from [facility].  Dr Raenette Rover.   She was  Scheduled for a vsat, but has not had it done.

## 2015-12-22 NOTE — Pre-Procedure Instructions (Signed)
[name]  12/22/2015      [id number], [place] - [address] [place] [address] Phone: [phone] Fax: [phone]    Your procedure is scheduled on 01/02/16.  Report to [facility] Admitting at 6 A.M.  Call this number if you have problems the morning of surgery:  [phone]   Remember:  Do not eat food or drink liquids after midnight.  Take these medicines the morning of surgery with A SIP OF WATER --klonipin,metroprolol,oxycodone,actose   Do not wear jewelry, make-up or nail polish.  Do not wear lotions, powders, or perfumes.  You may wear deodorant.  Do not shave 48 hours prior to surgery.  Men may shave face and neck.  Do not bring valuables to the hospital.  [facility] is not responsible for any belongings or valuables.  Contacts, dentures or bridgework may not be worn into surgery.  Leave your suitcase in the car.  After surgery it may be brought to your room.  For patients admitted to the hospital, discharge time will be determined by your treatment team.  Patients discharged the day of surgery will not be allowed to drive home.   Name and phone number of your driver:   Special instructions:   Please read over the following fact sheets that you were given. Pain Booklet, Coughing and Deep Breathing, MRSA Information and Surgical Site Infection Prevention How to Manage Your Diabetes Before Surgery   Why is it important to control my blood sugar before and after surgery?   Improving blood sugar levels before and after surgery helps healing and can limit problems.  A way of improving blood sugar control is eating a healthy diet by:  - Eating less sugar and carbohydrates  - Increasing activity/exercise  - Talk with your doctor about reaching your blood sugar goals  High blood sugars (greater than 180 mg/dL) can raise your risk of infections and slow down your recovery so you will need to focus on  controlling your diabetes during the weeks before surgery.  Make sure that the doctor who takes care of your diabetes knows about your planned surgery including the date and location.  How do I manage my blood sugars before surgery?   Check your blood sugar at least 4 times a day, 2 days before surgery to make sure that they are not too high or low.   Check your blood sugar the morning of your surgery when you wake up and every 2               hours until you get to the Short-Stay unit.  If your blood sugar is less than 70 mg/dL, you will need to treat for low blood sugar by:  Treat a low blood sugar (less than 70 mg/dL) with 1/2 cup of clear juice (cranberry or apple), 4 glucose tablets, OR glucose gel.  Recheck blood sugar in 15 minutes after treatment (to make sure it is greater than 70 mg/dL).  If blood sugar is not greater than 70 mg/dL on re-check, call [phone] for further instructions.   Report your blood sugar to the Short-Stay nurse when you get to Short-Stay.  References:  [facility], 2007 "How to Manage your Diabetes Before and After Surgery".  What do I do about my diabetes medications?   Do not take oral diabetes medicines (pills) the morning of surge    Do not take other  diabetes injectables the day of surgery including Byetta, Victoza, Bydureon, and Trulicity.    If your CBG is greater than 220 mg/dL, you may take 1/2 of your sliding scale (correction) dose of insulin.   For patients with "Insulin Pumps":  Contact your diabetes doctor for specific instructions before surgery.   Decrease basal insulin rates by 20% at midnight the night before surgery.  Note that if your surgery is planned to be longer than 2 hours, your insulin pump will be removed and intravenous (IV) insulin will be started and managed by the nurses and anesthesiologist.  You will be able to restart your insulin pump once you are awake and able to manage  it.  Make sure to bring insulin pump supplies to the hospital with you in case your site needs to be changed.

## 2015-12-23 LAB — HEMOGLOBIN A1C
Hgb A1c MFr Bld: 11.9 % — ABNORMAL HIGH (ref 4.8–5.6)
Mean Plasma Glucose: 295 mg/dL

## 2015-12-25 ENCOUNTER — Other Ambulatory Visit (HOSPITAL_COMMUNITY): Payer: Medicare Other

## 2015-12-26 NOTE — Progress Notes (Addendum)
Anesthesia Chart Review:  Pt is a 70 year old female scheduled for L4-5 PLIF on 01/02/2016 with Dr. Annette Stable.   PMH includes:  CHF, MI, DM, hyperlipidemia, OSA (not on CPAP), GERD, thyroid nodule, amnesia. Never smoker. BMI 47.   Medications include: lasix, humalog, metoprolol, pioglitazone  Preoperative labs reviewed.  HgbA1c 11.9, glucose 313.   EKG 12/22/15: Sinus tachycardia (118 bpm). Low voltage QRS. Inferior infarct, age undetermined. Cannot rule out Anterior infarct, age undetermined. Appears unchanged when compared to EKG dated 06/20/14.   Holter monitor 12/18/15 (care everywhere): NSR, atrial or ventricular ectopy was noted. Pt events correlated with sinus tachycardia.   Pt was seen as a new pt by Dr. Saralyn Pilar with cardiology (care everywhere) in 10/2015 for chest pain and palpitations/tachycardia. Echo ordered. Per Dr. Saralyn Pilar' office, pt did not show up for echo appointment.   Reviewed case with Dr. Glennon Mac. Pt will need echo and to get clearance for surgery from Dr. Saralyn Pilar prior to surgery. Notified Vanessa in Dr. Marchelle Folks office of uncontrolled DM and need for cardiac testing and clearance.   Willeen Cass, FNP-BC [facility]/Anesthesiology Phone: [phone] 12/26/2015 3:14 PM  Addendum:   Pt saw Dr. Saralyn Pilar 12/29/15 (care everywhere) was cleared for surgery at low risk. An echo was not performed.   If no changes, I anticipate pt can proceed with surgery as scheduled.   Willeen Cass, FNP-BC [facility] Stay [facility]/Anesthesiology Phone: [phone] 01/01/2016 12:50 PM

## 2016-01-01 MED ORDER — DEXTROSE 5 % IV SOLN
3.0000 g | INTRAVENOUS | Status: DC
  Filled 2016-01-01: qty 3000

## 2016-01-01 MED ORDER — CEFAZOLIN SODIUM-DEXTROSE 2-3 GM-% IV SOLR: 2.0000 g | INTRAVENOUS | Status: DC

## 2016-01-01 MED ORDER — DEXAMETHASONE SODIUM PHOSPHATE 10 MG/ML IJ SOLN: 10.0000 mg | INTRAMUSCULAR | Status: DC

## 2016-01-02 ENCOUNTER — Inpatient Hospital Stay (HOSPITAL_COMMUNITY): Payer: Medicare Other

## 2016-01-02 ENCOUNTER — Encounter (HOSPITAL_COMMUNITY)
Admission: RE | Disposition: A | Discharge status: Home Health | Payer: Self-pay | Source: Ambulatory Visit | Attending: Neurosurgery

## 2016-01-02 ENCOUNTER — Encounter (HOSPITAL_COMMUNITY): Payer: Self-pay | Admitting: Certified Registered Nurse Anesthetist

## 2016-01-02 ENCOUNTER — Inpatient Hospital Stay (HOSPITAL_COMMUNITY)
Admission: RE | Admit: 2016-01-02 | Discharge: 2016-01-04 | DRG: 460 | Disposition: A | Discharge status: Home Health | Payer: Medicare Other | Source: Ambulatory Visit | Attending: Neurosurgery | Admitting: Neurosurgery

## 2016-01-02 ENCOUNTER — Inpatient Hospital Stay (HOSPITAL_COMMUNITY): Payer: Medicare Other | Admitting: Anesthesiology

## 2016-01-02 ENCOUNTER — Inpatient Hospital Stay (HOSPITAL_COMMUNITY): Payer: Medicare Other | Admitting: Emergency Medicine

## 2016-01-02 DIAGNOSIS — K219 Gastro-esophageal reflux disease without esophagitis: Secondary | ICD-10-CM | POA: Diagnosis present

## 2016-01-02 DIAGNOSIS — F419 Anxiety disorder, unspecified: Secondary | ICD-10-CM | POA: Diagnosis present

## 2016-01-02 DIAGNOSIS — I252 Old myocardial infarction: Secondary | ICD-10-CM

## 2016-01-02 DIAGNOSIS — E119 Type 2 diabetes mellitus without complications: Secondary | ICD-10-CM | POA: Diagnosis not present

## 2016-01-02 DIAGNOSIS — Z9119 Patient's noncompliance with other medical treatment and regimen: Secondary | ICD-10-CM

## 2016-01-02 DIAGNOSIS — Z79899 Other long term (current) drug therapy: Secondary | ICD-10-CM

## 2016-01-02 DIAGNOSIS — M810 Age-related osteoporosis without current pathological fracture: Secondary | ICD-10-CM | POA: Diagnosis present

## 2016-01-02 DIAGNOSIS — E669 Obesity, unspecified: Secondary | ICD-10-CM | POA: Diagnosis present

## 2016-01-02 DIAGNOSIS — M4310 Spondylolisthesis, site unspecified: Secondary | ICD-10-CM | POA: Diagnosis not present

## 2016-01-02 DIAGNOSIS — E118 Type 2 diabetes mellitus with unspecified complications: Secondary | ICD-10-CM | POA: Diagnosis not present

## 2016-01-02 DIAGNOSIS — I10 Essential (primary) hypertension: Secondary | ICD-10-CM | POA: Diagnosis present

## 2016-01-02 DIAGNOSIS — R000 Tachycardia, unspecified: Secondary | ICD-10-CM | POA: Diagnosis not present

## 2016-01-02 DIAGNOSIS — G894 Chronic pain syndrome: Secondary | ICD-10-CM | POA: Diagnosis present

## 2016-01-02 DIAGNOSIS — Z794 Long term (current) use of insulin: Secondary | ICD-10-CM

## 2016-01-02 DIAGNOSIS — F329 Major depressive disorder, single episode, unspecified: Secondary | ICD-10-CM | POA: Diagnosis present

## 2016-01-02 DIAGNOSIS — Z720 Tobacco use: Secondary | ICD-10-CM | POA: Diagnosis not present

## 2016-01-02 DIAGNOSIS — G4730 Sleep apnea, unspecified: Secondary | ICD-10-CM | POA: Diagnosis present

## 2016-01-02 DIAGNOSIS — M4806 Spinal stenosis, lumbar region: Secondary | ICD-10-CM | POA: Diagnosis present

## 2016-01-02 DIAGNOSIS — F418 Other specified anxiety disorders: Secondary | ICD-10-CM | POA: Diagnosis not present

## 2016-01-02 DIAGNOSIS — M549 Dorsalgia, unspecified: Secondary | ICD-10-CM

## 2016-01-02 DIAGNOSIS — E1169 Type 2 diabetes mellitus with other specified complication: Secondary | ICD-10-CM | POA: Insufficient documentation

## 2016-01-02 DIAGNOSIS — E785 Hyperlipidemia, unspecified: Secondary | ICD-10-CM | POA: Diagnosis present

## 2016-01-02 DIAGNOSIS — M79606 Pain in leg, unspecified: Secondary | ICD-10-CM | POA: Diagnosis present

## 2016-01-02 DIAGNOSIS — E1165 Type 2 diabetes mellitus with hyperglycemia: Secondary | ICD-10-CM | POA: Diagnosis present

## 2016-01-02 DIAGNOSIS — F32A Depression, unspecified: Secondary | ICD-10-CM | POA: Diagnosis present

## 2016-01-02 LAB — GLUCOSE, CAPILLARY
Glucose-Capillary: 193 mg/dL — ABNORMAL HIGH (ref 65–99)
Glucose-Capillary: 235 mg/dL — ABNORMAL HIGH (ref 65–99)
Glucose-Capillary: 248 mg/dL — ABNORMAL HIGH (ref 65–99)
Glucose-Capillary: 261 mg/dL — ABNORMAL HIGH (ref 65–99)
Glucose-Capillary: 292 mg/dL — ABNORMAL HIGH (ref 65–99)
Glucose-Capillary: 305 mg/dL — ABNORMAL HIGH (ref 65–99)
Glucose-Capillary: 310 mg/dL — ABNORMAL HIGH (ref 65–99)
Glucose-Capillary: 333 mg/dL — ABNORMAL HIGH (ref 65–99)
Glucose-Capillary: 351 mg/dL — ABNORMAL HIGH (ref 65–99)
Glucose-Capillary: 359 mg/dL — ABNORMAL HIGH (ref 65–99)
Glucose-Capillary: 377 mg/dL — ABNORMAL HIGH (ref 65–99)
Glucose-Capillary: 412 mg/dL — ABNORMAL HIGH (ref 65–99)
Glucose-Capillary: 426 mg/dL — ABNORMAL HIGH (ref 65–99)

## 2016-01-02 LAB — BASIC METABOLIC PANEL
Anion gap: 12 (ref 5–15)
BUN: 19 mg/dL (ref 6–20)
CO2: 23 mmol/L (ref 22–32)
Calcium: 8.8 mg/dL — ABNORMAL LOW (ref 8.9–10.3)
Chloride: 101 mmol/L (ref 101–111)
Creatinine, Ser: 0.78 mg/dL (ref 0.44–1.00)
GFR calc Af Amer: >60 mL/min (ref >60)
GFR calc non Af Amer: >60 mL/min (ref >60)
Glucose, Bld: 287 mg/dL — ABNORMAL HIGH (ref 65–99)
Potassium: 4.8 mmol/L (ref 3.5–5.1)
Sodium: 136 mmol/L (ref 135–145)

## 2016-01-02 SURGERY — POSTERIOR LUMBAR FUSION 1 LEVEL
Anesthesia: General | Site: Back

## 2016-01-02 MED ORDER — DOXEPIN HCL 25 MG PO CAPS
25.0000 mg | ORAL_CAPSULE | Freq: Every day | ORAL | Status: DC
  Administered 2016-01-02 – 2016-01-03 (×2): 25 mg via ORAL
  Filled 2016-01-02 (×3): qty 1

## 2016-01-02 MED ORDER — INSULIN REGULAR BOLUS VIA INFUSION
0.0000 [IU] | Freq: Three times a day (TID) | INTRAVENOUS | Status: DC
  Filled 2016-01-02: qty 10

## 2016-01-02 MED ORDER — ACETAMINOPHEN 10 MG/ML IV SOLN
INTRAVENOUS | Status: AC
  Administered 2016-01-02: 1000 mg via INTRAVENOUS
  Filled 2016-01-02: qty 100

## 2016-01-02 MED ORDER — FENTANYL CITRATE (PF) 100 MCG/2ML IJ SOLN
INTRAMUSCULAR | Status: DC | PRN
  Administered 2016-01-02 (×2): 100 ug via INTRAVENOUS

## 2016-01-02 MED ORDER — METOPROLOL SUCCINATE ER 25 MG PO TB24
25.0000 mg | ORAL_TABLET | Freq: Every day | ORAL | Status: DC
  Administered 2016-01-02 – 2016-01-03 (×2): 25 mg via ORAL
  Filled 2016-01-02 (×2): qty 1

## 2016-01-02 MED ORDER — INSULIN ASPART PROT & ASPART (70-30 MIX) 100 UNIT/ML ~~LOC~~ SUSP
40.0000 [IU] | Freq: Two times a day (BID) | SUBCUTANEOUS | Status: DC
  Administered 2016-01-02 – 2016-01-03 (×2): 40 [IU] via SUBCUTANEOUS
  Filled 2016-01-02: qty 10

## 2016-01-02 MED ORDER — ACETAMINOPHEN 650 MG RE SUPP: 650.0000 mg | RECTAL | Status: DC | PRN

## 2016-01-02 MED ORDER — OXYCODONE-ACETAMINOPHEN 5-325 MG PO TABS
1.0000 | ORAL_TABLET | ORAL | Status: DC | PRN
  Administered 2016-01-03 – 2016-01-04 (×2): 2 via ORAL
  Filled 2016-01-02 (×2): qty 2

## 2016-01-02 MED ORDER — HYDROCODONE-ACETAMINOPHEN 5-325 MG PO TABS: 1.0000 | ORAL_TABLET | ORAL | Status: DC | PRN

## 2016-01-02 MED ORDER — SUCCINYLCHOLINE CHLORIDE 20 MG/ML IJ SOLN
INTRAMUSCULAR | Status: AC
  Filled 2016-01-02: qty 1

## 2016-01-02 MED ORDER — SODIUM CHLORIDE 0.9 % IJ SOLN
INTRAMUSCULAR | Status: AC
  Filled 2016-01-02: qty 10

## 2016-01-02 MED ORDER — ONDANSETRON HCL 4 MG/2ML IJ SOLN
INTRAMUSCULAR | Status: DC | PRN
  Administered 2016-01-02: 4 mg via INTRAVENOUS

## 2016-01-02 MED ORDER — ACETAMINOPHEN 325 MG PO TABS
650.0000 mg | ORAL_TABLET | ORAL | Status: DC | PRN
  Administered 2016-01-03 – 2016-01-04 (×2): 650 mg via ORAL
  Filled 2016-01-02 (×2): qty 2

## 2016-01-02 MED ORDER — OXYCODONE HCL 5 MG PO TABS
20.0000 mg | ORAL_TABLET | Freq: Four times a day (QID) | ORAL | Status: DC | PRN
  Administered 2016-01-02 – 2016-01-04 (×7): 20 mg via ORAL
  Filled 2016-01-02 (×7): qty 4

## 2016-01-02 MED ORDER — LIDOCAINE HCL (CARDIAC) 20 MG/ML IV SOLN
INTRAVENOUS | Status: AC
  Filled 2016-01-02: qty 5

## 2016-01-02 MED ORDER — VANCOMYCIN HCL 1000 MG IV SOLR
INTRAVENOUS | Status: DC | PRN
  Administered 2016-01-02: 1000 mg

## 2016-01-02 MED ORDER — SUGAMMADEX SODIUM 200 MG/2ML IV SOLN
INTRAVENOUS | Status: AC
  Filled 2016-01-02: qty 2

## 2016-01-02 MED ORDER — ONDANSETRON HCL 4 MG/2ML IJ SOLN
INTRAMUSCULAR | Status: AC
  Filled 2016-01-02: qty 2

## 2016-01-02 MED ORDER — CLONAZEPAM 0.5 MG PO TABS: 1.0000 mg | ORAL_TABLET | Freq: Three times a day (TID) | ORAL | Status: DC

## 2016-01-02 MED ORDER — ESMOLOL HCL 100 MG/10ML IV SOLN
INTRAVENOUS | Status: DC | PRN
  Administered 2016-01-02: 30 mg via INTRAVENOUS

## 2016-01-02 MED ORDER — SODIUM CHLORIDE 0.9% FLUSH: 3.0000 mL | INTRAVENOUS | Status: DC | PRN

## 2016-01-02 MED ORDER — BUPIVACAINE HCL (PF) 0.25 % IJ SOLN
INTRAMUSCULAR | Status: DC | PRN
  Administered 2016-01-02: 20 mL

## 2016-01-02 MED ORDER — 0.9 % SODIUM CHLORIDE (POUR BTL) OPTIME
TOPICAL | Status: DC | PRN
  Administered 2016-01-02: 1000 mL

## 2016-01-02 MED ORDER — HYDROMORPHONE HCL 1 MG/ML IJ SOLN
INTRAMUSCULAR | Status: AC
  Filled 2016-01-02: qty 1

## 2016-01-02 MED ORDER — DEXTROSE-NACL 5-0.45 % IV SOLN: INTRAVENOUS | Status: DC

## 2016-01-02 MED ORDER — SODIUM CHLORIDE 0.9% FLUSH
3.0000 mL | Freq: Two times a day (BID) | INTRAVENOUS | Status: DC
  Administered 2016-01-02 – 2016-01-03 (×3): 3 mL via INTRAVENOUS

## 2016-01-02 MED ORDER — SODIUM CHLORIDE 0.9 % IV SOLN
250.0000 [IU] | INTRAVENOUS | Status: DC | PRN
  Administered 2016-01-02: 3.5 [IU]/h via INTRAVENOUS

## 2016-01-02 MED ORDER — BUSPIRONE HCL 15 MG PO TABS
30.0000 mg | ORAL_TABLET | Freq: Two times a day (BID) | ORAL | Status: DC
  Administered 2016-01-02 – 2016-01-04 (×5): 30 mg via ORAL
  Filled 2016-01-02 (×5): qty 2

## 2016-01-02 MED ORDER — FESOTERODINE FUMARATE ER 8 MG PO TB24
8.0000 mg | ORAL_TABLET | Freq: Every day | ORAL | Status: DC
  Filled 2016-01-02: qty 1

## 2016-01-02 MED ORDER — FUROSEMIDE 40 MG PO TABS: 40.0000 mg | ORAL_TABLET | Freq: Two times a day (BID) | ORAL | Status: DC

## 2016-01-02 MED ORDER — FESOTERODINE FUMARATE ER 8 MG PO TB24
8.0000 mg | ORAL_TABLET | Freq: Every day | ORAL | Status: DC
  Administered 2016-01-02 – 2016-01-04 (×3): 8 mg via ORAL
  Filled 2016-01-02 (×4): qty 1

## 2016-01-02 MED ORDER — INSULIN ASPART 100 UNIT/ML ~~LOC~~ SOLN
10.0000 [IU] | Freq: Once | SUBCUTANEOUS | Status: AC
  Administered 2016-01-02: 10 [IU] via SUBCUTANEOUS

## 2016-01-02 MED ORDER — NYSTATIN 100000 UNIT/GM EX POWD: 1.0000 | Freq: Two times a day (BID) | CUTANEOUS | Status: DC

## 2016-01-02 MED ORDER — CLONAZEPAM 0.5 MG PO TABS: 0.5000 mg | ORAL_TABLET | Freq: Three times a day (TID) | ORAL | Status: DC | PRN

## 2016-01-02 MED ORDER — PROPOFOL 10 MG/ML IV BOLUS
INTRAVENOUS | Status: DC | PRN
  Administered 2016-01-02: 150 mg via INTRAVENOUS
  Administered 2016-01-02: 50 mg via INTRAVENOUS

## 2016-01-02 MED ORDER — SODIUM CHLORIDE 0.9 % IR SOLN
Status: DC | PRN
  Administered 2016-01-02: 500 mL

## 2016-01-02 MED ORDER — PROPOFOL 10 MG/ML IV BOLUS
INTRAVENOUS | Status: AC
  Filled 2016-01-02: qty 20

## 2016-01-02 MED ORDER — ONDANSETRON HCL 4 MG/2ML IJ SOLN: 4.0000 mg | INTRAMUSCULAR | Status: DC | PRN

## 2016-01-02 MED ORDER — FENTANYL CITRATE (PF) 250 MCG/5ML IJ SOLN
INTRAMUSCULAR | Status: AC
  Filled 2016-01-02: qty 5

## 2016-01-02 MED ORDER — INSULIN ASPART PROT & ASPART (70-30 MIX) 100 UNIT/ML ~~LOC~~ SUSP
40.0000 [IU] | Freq: Two times a day (BID) | SUBCUTANEOUS | Status: DC
  Filled 2016-01-02: qty 10

## 2016-01-02 MED ORDER — DEXTROSE 50 % IV SOLN: 25.0000 mL | INTRAVENOUS | Status: DC | PRN

## 2016-01-02 MED ORDER — VANCOMYCIN HCL 1000 MG IV SOLR
INTRAVENOUS | Status: AC
  Filled 2016-01-02: qty 1000

## 2016-01-02 MED ORDER — ROCURONIUM BROMIDE 100 MG/10ML IV SOLN
INTRAVENOUS | Status: DC | PRN
  Administered 2016-01-02: 10 mg via INTRAVENOUS
  Administered 2016-01-02: 20 mg via INTRAVENOUS
  Administered 2016-01-02: 50 mg via INTRAVENOUS

## 2016-01-02 MED ORDER — LIDOCAINE HCL (CARDIAC) 20 MG/ML IV SOLN
INTRAVENOUS | Status: DC | PRN
  Administered 2016-01-02: 100 mg via INTRAVENOUS

## 2016-01-02 MED ORDER — SODIUM CHLORIDE 0.9 % IV SOLN: 250.0000 mL | INTRAVENOUS | Status: DC

## 2016-01-02 MED ORDER — SODIUM CHLORIDE 0.9 % IV BOLUS (SEPSIS)
500.0000 mL | Freq: Once | INTRAVENOUS | Status: AC
  Administered 2016-01-02: 500 mL via INTRAVENOUS

## 2016-01-02 MED ORDER — INSULIN ASPART 100 UNIT/ML ~~LOC~~ SOLN
0.0000 [IU] | Freq: Every day | SUBCUTANEOUS | Status: DC
  Administered 2016-01-02 – 2016-01-03 (×2): 3 [IU] via SUBCUTANEOUS

## 2016-01-02 MED ORDER — INSULIN LISPRO 100 UNIT/ML ~~LOC~~ SOLN: 60.0000 [IU] | Freq: Every day | SUBCUTANEOUS | Status: DC

## 2016-01-02 MED ORDER — INSULIN ASPART 100 UNIT/ML ~~LOC~~ SOLN: 0.0000 [IU] | Freq: Every day | SUBCUTANEOUS | Status: DC

## 2016-01-02 MED ORDER — INSULIN ASPART 100 UNIT/ML ~~LOC~~ SOLN: 0.0000 [IU] | Freq: Three times a day (TID) | SUBCUTANEOUS | Status: DC

## 2016-01-02 MED ORDER — LIVING WELL WITH DIABETES BOOK
Freq: Once | Status: AC
  Administered 2016-01-02: 13:00:00
  Filled 2016-01-02: qty 1

## 2016-01-02 MED ORDER — SODIUM CHLORIDE 0.9 % IV SOLN
INTRAVENOUS | Status: DC
  Filled 2016-01-02: qty 2.5

## 2016-01-02 MED ORDER — PHENYLEPHRINE 40 MCG/ML (10ML) SYRINGE FOR IV PUSH (FOR BLOOD PRESSURE SUPPORT)
PREFILLED_SYRINGE | INTRAVENOUS | Status: AC
  Filled 2016-01-02: qty 10

## 2016-01-02 MED ORDER — INSULIN ASPART 100 UNIT/ML ~~LOC~~ SOLN
0.0000 [IU] | Freq: Three times a day (TID) | SUBCUTANEOUS | Status: DC
  Administered 2016-01-02: 8 [IU] via SUBCUTANEOUS
  Administered 2016-01-03 (×2): 11 [IU] via SUBCUTANEOUS
  Administered 2016-01-03: 15 [IU] via SUBCUTANEOUS
  Administered 2016-01-04 (×2): 11 [IU] via SUBCUTANEOUS
  Administered 2016-01-04: 15 [IU] via SUBCUTANEOUS

## 2016-01-02 MED ORDER — DIAZEPAM 5 MG PO TABS: 5.0000 mg | ORAL_TABLET | Freq: Four times a day (QID) | ORAL | Status: DC | PRN

## 2016-01-02 MED ORDER — HYDROMORPHONE HCL 1 MG/ML IJ SOLN: 0.5000 mg | INTRAMUSCULAR | Status: DC | PRN

## 2016-01-02 MED ORDER — EPHEDRINE SULFATE 50 MG/ML IJ SOLN
INTRAMUSCULAR | Status: AC
  Filled 2016-01-02: qty 1

## 2016-01-02 MED ORDER — OXYCODONE HCL 5 MG/5ML PO SOLN: 5.0000 mg | Freq: Once | ORAL | Status: DC | PRN

## 2016-01-02 MED ORDER — ONDANSETRON HCL 4 MG/2ML IJ SOLN: 4.0000 mg | Freq: Once | INTRAMUSCULAR | Status: DC | PRN

## 2016-01-02 MED ORDER — CLONAZEPAM 0.5 MG PO TABS
0.5000 mg | ORAL_TABLET | Freq: Three times a day (TID) | ORAL | Status: DC | PRN
  Administered 2016-01-03 – 2016-01-04 (×2): 0.5 mg via ORAL
  Filled 2016-01-02 (×2): qty 1

## 2016-01-02 MED ORDER — THROMBIN 20000 UNITS EX SOLR
CUTANEOUS | Status: DC | PRN
  Administered 2016-01-02: 20 mL via TOPICAL

## 2016-01-02 MED ORDER — PIOGLITAZONE HCL 15 MG PO TABS
45.0000 mg | ORAL_TABLET | Freq: Every day | ORAL | Status: DC
  Filled 2016-01-02: qty 3

## 2016-01-02 MED ORDER — FUROSEMIDE 40 MG PO TABS
40.0000 mg | ORAL_TABLET | Freq: Two times a day (BID) | ORAL | Status: DC
  Administered 2016-01-03 – 2016-01-04 (×4): 40 mg via ORAL
  Filled 2016-01-02 (×4): qty 1

## 2016-01-02 MED ORDER — MIDAZOLAM HCL 2 MG/2ML IJ SOLN
INTRAMUSCULAR | Status: AC
  Filled 2016-01-02: qty 2

## 2016-01-02 MED ORDER — SUGAMMADEX SODIUM 200 MG/2ML IV SOLN
INTRAVENOUS | Status: DC | PRN
  Administered 2016-01-02: 200 mg via INTRAVENOUS

## 2016-01-02 MED ORDER — PHENYLEPHRINE HCL 10 MG/ML IJ SOLN
10.0000 mg | INTRAVENOUS | Status: DC | PRN
  Administered 2016-01-02: 40 ug/min via INTRAVENOUS

## 2016-01-02 MED ORDER — HYDROMORPHONE HCL 1 MG/ML IJ SOLN
0.2500 mg | INTRAMUSCULAR | Status: DC | PRN
  Administered 2016-01-02: 0.5 mg via INTRAVENOUS

## 2016-01-02 MED ORDER — SODIUM CHLORIDE 0.9 % IV SOLN: INTRAVENOUS | Status: DC

## 2016-01-02 MED ORDER — PHENOL 1.4 % MT LIQD: 1.0000 | OROMUCOSAL | Status: DC | PRN

## 2016-01-02 MED ORDER — ROCURONIUM BROMIDE 50 MG/5ML IV SOLN
INTRAVENOUS | Status: AC
  Filled 2016-01-02: qty 1

## 2016-01-02 MED ORDER — CEFAZOLIN SODIUM 1-5 GM-% IV SOLN
1.0000 g | Freq: Three times a day (TID) | INTRAVENOUS | Status: AC
  Administered 2016-01-02 (×2): 1 g via INTRAVENOUS
  Filled 2016-01-02 (×2): qty 50

## 2016-01-02 MED ORDER — OXYCODONE HCL 5 MG PO TABS: 5.0000 mg | ORAL_TABLET | Freq: Once | ORAL | Status: DC | PRN

## 2016-01-02 MED ORDER — HYDRALAZINE HCL 20 MG/ML IJ SOLN
10.0000 mg | Freq: Four times a day (QID) | INTRAMUSCULAR | Status: DC | PRN
  Filled 2016-01-02: qty 0.5

## 2016-01-02 MED ORDER — MENTHOL 3 MG MT LOZG: 1.0000 | LOZENGE | OROMUCOSAL | Status: DC | PRN

## 2016-01-02 MED ORDER — LACTATED RINGERS IV SOLN
INTRAVENOUS | Status: DC
  Administered 2016-01-02 (×3): via INTRAVENOUS

## 2016-01-02 MED ORDER — MIDAZOLAM HCL 5 MG/5ML IJ SOLN
INTRAMUSCULAR | Status: DC | PRN
  Administered 2016-01-02: 2 mg via INTRAVENOUS

## 2016-01-02 SURGICAL SUPPLY — 59 items
APL SKNCLS STERI-STRIP NONHPOA (GAUZE/BANDAGES/DRESSINGS) ×1
BAG DECANTER FOR FLEXI CONT (MISCELLANEOUS) ×2 IMPLANT
BENZOIN TINCTURE PRP APPL 2/3 (GAUZE/BANDAGES/DRESSINGS) ×2 IMPLANT
BLADE CLIPPER SURG (BLADE) IMPLANT
BRUSH SCRUB EZ PLAIN DRY (MISCELLANEOUS) ×2 IMPLANT
BUR CUTTER 7.0 ROUND (BURR) IMPLANT
BUR MATCHSTICK NEURO 3.0 LAGG (BURR) ×2 IMPLANT
CANISTER SUCT 3000ML PPV (MISCELLANEOUS) ×2 IMPLANT
CAP LCK SPNE (Orthopedic Implant) ×4 IMPLANT
CAP LOCK SPINE RADIUS (Orthopedic Implant) IMPLANT
CAP LOCKING (Orthopedic Implant) ×8 IMPLANT
CONT SPEC 4OZ CLIKSEAL STRL BL (MISCELLANEOUS) ×2 IMPLANT
COVER BACK TABLE 60X90IN (DRAPES) ×2 IMPLANT
DECANTER SPIKE VIAL GLASS SM (MISCELLANEOUS) ×2 IMPLANT
DEVICE INTERBODY ELEVATE 23X8 (Cage) ×4 IMPLANT
DRAPE C-ARM 42X72 X-RAY (DRAPES) ×4 IMPLANT
DRAPE LAPAROTOMY 100X72X124 (DRAPES) ×2 IMPLANT
DRAPE POUCH INSTRU U-SHP 10X18 (DRAPES) ×2 IMPLANT
DRAPE PROXIMA HALF (DRAPES) IMPLANT
DRAPE SURG 17X23 STRL (DRAPES) ×8 IMPLANT
DRSG OPSITE POSTOP 4X6 (GAUZE/BANDAGES/DRESSINGS) ×1 IMPLANT
DURAPREP 26ML APPLICATOR (WOUND CARE) ×2 IMPLANT
ELECT REM PT RETURN 9FT ADLT (ELECTROSURGICAL) ×2
ELECTRODE REM PT RTRN 9FT ADLT (ELECTROSURGICAL) ×1 IMPLANT
EVACUATOR 1/8 PVC DRAIN (DRAIN) ×2 IMPLANT
GAUZE SPONGE 4X4 12PLY STRL (GAUZE/BANDAGES/DRESSINGS) ×2 IMPLANT
GAUZE SPONGE 4X4 16PLY XRAY LF (GAUZE/BANDAGES/DRESSINGS) IMPLANT
GLOVE ECLIPSE 9.0 STRL (GLOVE) ×4 IMPLANT
GLOVE EXAM NITRILE LRG STRL (GLOVE) IMPLANT
GLOVE EXAM NITRILE MD LF STRL (GLOVE) IMPLANT
GLOVE EXAM NITRILE XL STR (GLOVE) IMPLANT
GLOVE EXAM NITRILE XS STR PU (GLOVE) IMPLANT
GOWN STRL REUS W/ TWL LRG LVL3 (GOWN DISPOSABLE) IMPLANT
GOWN STRL REUS W/ TWL XL LVL3 (GOWN DISPOSABLE) ×2 IMPLANT
GOWN STRL REUS W/TWL 2XL LVL3 (GOWN DISPOSABLE) IMPLANT
GOWN STRL REUS W/TWL LRG LVL3 (GOWN DISPOSABLE)
GOWN STRL REUS W/TWL XL LVL3 (GOWN DISPOSABLE) ×4
KIT BASIN OR (CUSTOM PROCEDURE TRAY) ×2 IMPLANT
KIT ROOM TURNOVER OR (KITS) ×2 IMPLANT
LIQUID BAND (GAUZE/BANDAGES/DRESSINGS) ×2 IMPLANT
NEEDLE HYPO 22GX1.5 SAFETY (NEEDLE) ×2 IMPLANT
NS IRRIG 1000ML POUR BTL (IV SOLUTION) ×2 IMPLANT
PACK LAMINECTOMY NEURO (CUSTOM PROCEDURE TRAY) ×2 IMPLANT
ROD RADIUS 45MM (Rod) ×4 IMPLANT
ROD SPNL 45X5.5XNS TI RDS (Rod) IMPLANT
SCREW 6.75X45MM (Screw) ×4 IMPLANT
SPACER SPNL XLORDOTIC 23X8X (Cage) IMPLANT
SPCR SPNL XLORDOTIC 23X8X (Cage) ×2 IMPLANT
SPONGE SURGIFOAM ABS GEL 100 (HEMOSTASIS) ×2 IMPLANT
STRIP CLOSURE SKIN 1/2X4 (GAUZE/BANDAGES/DRESSINGS) ×4 IMPLANT
SUT VIC AB 0 CT1 18XCR BRD8 (SUTURE) ×2 IMPLANT
SUT VIC AB 0 CT1 8-18 (SUTURE) ×4
SUT VIC AB 2-0 CT1 18 (SUTURE) ×2 IMPLANT
SUT VIC AB 3-0 SH 8-18 (SUTURE) ×4 IMPLANT
TAPE STRIPS DRAPE STRL (GAUZE/BANDAGES/DRESSINGS) ×1 IMPLANT
TOWEL OR 17X24 6PK STRL BLUE (TOWEL DISPOSABLE) ×2 IMPLANT
TOWEL OR 17X26 10 PK STRL BLUE (TOWEL DISPOSABLE) ×2 IMPLANT
TRAY FOLEY W/METER SILVER 14FR (SET/KITS/TRAYS/PACK) ×2 IMPLANT
WATER STERILE IRR 1000ML POUR (IV SOLUTION) ×2 IMPLANT

## 2016-01-02 NOTE — Progress Notes (Signed)
cbg [id number] Dr Jillyn Hidden informed. Order received to give 10 u reg

## 2016-01-02 NOTE — Consult Note (Signed)
[facility] Medical Consultation  [name] BMW:[id number] DOB: 02-02-1946 DOA: 01/02/2016 PCP: Lorelee Market, MD   Requesting physician: pool Date of consultation: 01/02/2016 Reason for consultation: uncontrolled diabetes  Impression/Recommendations Principal Problem:   Diabetes mellitus ([place]) Active Problems:   Hyperlipidemia   Anxiety and depression   GERD (gastroesophageal reflux disease)   Chronic pain syndrome   Degenerative spondylolisthesis   Tachycardia  #1. Diabetes  Uncontrolled. Hemoglobin A1c 11.910 days ago. Patient presented this morning for lumbar surgery with the serum glucose at 426. Home regimen consists of Humalog 60 units daily as well as Actos. This was recently changed from 100 units twice a day 2 months ago due to episode of hypoglycemia. She admits to an element of noncompliance with monitoring diet restrictions clinic follow-up. Insulin drip initiated preop. Most recent CBG 292. -Obtain bemet stat evaluate electrolytes and for gap -Wean insulin drip per protocol -Transition IV fluids per protocol -Sliding scale insulin once off drip -Hold Actos for now as appetite unreliable -Diabetes coordinator consult -Follow recommendations of coordinator -Follow be met  #2. Tachycardia. Likely related to uncontrolled diabetes in the setting of recent surgery. Rate 114 on exam. No chest pain - Provide 500 mL bolus of normal saline continue with normal saline at 75 an hour. -Resume home metoprolol -Roger on telemetry  #3. Hypertension. Blood pressure on the high-end of normal. Likely related to above as well as missing her beta blocker dose -Resume home beta blocker -When necessary hydralazine -Resume Lasix tomorrow   4. Jerrye Bushy. Stable at baseline -continue home meds    TR[facility] will follow up again tomorrow. Please contact me if I can be of assistance in the meanwhile. Thank you for this consultation.  Chief Complaint: Uncontrolled blood  sugar  HPI: 70 year old with past medical history that includes diabetes, hypertension, chronic back pain who underwent L4-L5 laminectomy with decompression and lumbar fusion. Per Dr. Trenton Gammon patient presented to the hospital for surgery [place] serum glucose 426. Insulin drip was initiated. [facility] consulted for diabetes management.  Patient reports single dose insulin with Actos as her home regimen. This was changed in December from 100 units twice a day after episode of hypoglycemia. She reports only fair compliance with monitoring management diet restrictions. Chart review indicates hemoglobin A1c greater than 11.  Reports recent change in PCP. She denies any abdominal pain nausea vomiting increased thirst. She denies dysuria hematuria frequency or urgency. She denies chest pain palpitations shortness of breath headache dizziness syncope or near-syncope. She denies any fever chills recent sick contacts or travel.   Review of Systems:  10 point review of systems complete and all systems are negative except as indicated in the history of present illness  Past Medical History  Diagnosis Date  . Hyperlipidemia   . Diabetes mellitus without complication ([place])   . Arthritis   . Osteoporosis   . Thyroid nodule   . Anxiety   . Depression   . Osteoporosis   . Cystocele   . Chronic pain syndrome   . GERD (gastroesophageal reflux disease)   . Back pain   . Obesity   . Amnesia   . CHF (congestive heart failure) ([place])   . IBS (irritable bowel syndrome)   . Shortness of breath dyspnea   . Sleep apnea     not using now  . Myocardial infarction [facility])    Past Surgical History  Procedure Laterality Date  . Appendectomy    . Cholecystectomy    . Abdominal hysterectomy    .  Oophorectomy    . Replacement total knee    . Foot surgery    . Tonsillectomy    . Cardiac catheterization     Social History:  reports that she has never smoked. Her smokeless tobacco use includes Chew. She  reports that she does not drink alcohol. Her drug history is not on file.  No Known Allergies Family History  Problem Relation Age of Onset  . Cancer Mother   . Cancer Father   . Cancer Sister   . Thyroid disease Neg Hx     Prior to Admission medications   Medication Sig Start Date End Date Taking? Authorizing Provider  busPIRone (BUSPAR) 30 MG tablet Take 30 mg by mouth 2 (two) times daily.   Yes Historical Provider, MD  clonazePAM (KLONOPIN) 1 MG tablet Take 1 mg by mouth 3 (three) times daily.   Yes Historical Provider, MD  cyclobenzaprine (FLEXERIL) 10 MG tablet Take 10 mg by mouth 3 (three) times daily.   Yes Historical Provider, MD  doxepin (SINEQUAN) 25 MG capsule Take 25 mg by mouth at bedtime.   Yes Historical Provider, MD  furosemide (LASIX) 40 MG tablet Take 40 mg by mouth 2 (two) times daily.   Yes Historical Provider, MD  insulin lispro (HUMALOG) 100 UNIT/ML injection Inject 60 Units into the skin daily.    Yes Historical Provider, MD  metoprolol succinate (TOPROL-XL) 25 MG 24 hr tablet Take 25 mg by mouth daily.   Yes Historical Provider, MD  nystatin (MYCOSTATIN) powder Apply 1 Bottle topically 2 (two) times daily. Breast/stomach folds 08/04/14  Yes Historical Provider, MD  Oxycodone HCl 20 MG TABS Take 20 mg by mouth 4 (four) times daily.  07/22/14  Yes Historical Provider, MD  pioglitazone (ACTOS) 45 MG tablet Take 45 mg by mouth daily. 02/02/15  Yes Historical Provider, MD  tolterodine (DETROL LA) 4 MG 24 hr capsule Take 1 capsule (4 mg total) by mouth daily. Start with 1/2 tablet daily x 1 week, then increase to whole tablet if needed. Patient taking differently: Take 4 mg by mouth daily.  10/03/15  Yes Rubie Maid, MD   Physical Exam: Blood pressure 147/97, pulse 116, temperature 98.4 F (36.9 C), temperature source Oral, resp. rate 20, weight 124.739 kg (275 lb), SpO2 94 %. Filed Vitals:   01/02/16 1130 01/02/16 1200  BP: 110/70 147/97  Pulse: 116 116  Temp: 98.2  F (36.8 C) 98.4 F (36.9 C)  Resp: 23 20     General:  Obese somewhat lethargic cooperative  Eyes: Pupils equal round reactive to light EOMI  ENT: Clear nose without drainage oropharynx without erythema or exudate mucous membranes of her mouth are pink but very dry  Neck: Supple no JVD full range of motion  Cardiovascular: Tachycardic but regular I hear no murmur gallop or rub trace lower extremity edema  Respiratory: Abdominal effort somewhat shallow breath sounds distant but clear hear no wheezes or rales  Abdomen: Be soft positive bowel sounds throughout nontender palpation no guarding or rebounding  Skin: Slightly cool but dry no rash or lesions  Musculoskeletal: Joints without swelling/erythema did not reposition to examine fresh lumbar incision  Psychiatric: Appropriate cooperative  Neurologic: [place] clear facial symmetry  Labs on Admission:  Basic Metabolic Panel: No results for input(s): NA, K, CL, CO2, GLUCOSE, BUN, CREATININE, CALCIUM, MG, PHOS in the last 168 hours. Liver Function Tests: No results for input(s): AST, ALT, ALKPHOS, BILITOT, PROT, ALBUMIN in the last 168 hours. No results  for input(s): LIPASE, AMYLASE in the last 168 hours. No results for input(s): AMMONIA in the last 168 hours. CBC: No results for input(s): WBC, NEUTROABS, HGB, HCT, MCV, PLT in the last 168 hours. Cardiac Enzymes: No results for input(s): CKTOTAL, CKMB, CKMBINDEX, TROPONINI in the last 168 hours. BNP: Invalid input(s): POCBNP CBG:  Recent Labs Lab 01/02/16 0727 01/02/16 0908 01/02/16 1010 01/02/16 1107 01/02/16 1210  GLUCAP 412* 351* 359* 333* 292*    Radiological Exams on Admission: Dg Lumbar Spine 2-3 Views  01/02/2016  CLINICAL DATA:  Status post posterior fusion of L4-5. EXAM: LUMBAR SPINE - 2-3 VIEW; DG C-ARM 61-120 MIN COMPARISON:  None. FLUOROSCOPY TIME:  46 seconds. FINDINGS: Status post surgical posterior fusion of L4-5 with bilateral intrapedicular screw  placements. Interbody fusion is noted. Good alignment of vertebral bodies is noted. IMPRESSION: Status post surgical posterior fusion of L4-5. Electronically Signed   By: Marijo Conception, M.D.   On: 01/02/2016 10:28   Dg C-arm 1-60 Min  01/02/2016  CLINICAL DATA:  Status post posterior fusion of L4-5. EXAM: LUMBAR SPINE - 2-3 VIEW; DG C-ARM 61-120 MIN COMPARISON:  None. FLUOROSCOPY TIME:  46 seconds. FINDINGS: Status post surgical posterior fusion of L4-5 with bilateral intrapedicular screw placements. Interbody fusion is noted. Good alignment of vertebral bodies is noted. IMPRESSION: Status post surgical posterior fusion of L4-5. Electronically Signed   By: Marijo Conception, M.D.   On: 01/02/2016 10:28    EKG:   Time spent: 40 mimutes  [facility]   If 7PM-7AM, please contact night-coverage [email or web address] Password [email or web address] 01/02/2016, 12:52 PM

## 2016-01-02 NOTE — Brief Op Note (Signed)
01/02/2016  10:22 AM  PATIENT:  [name]  70 y.o. female  PRE-OPERATIVE DIAGNOSIS:  Spondylolisthesis  POST-OPERATIVE DIAGNOSIS:  Spondylolisthesis  PROCEDURE:  Procedure(s) with comments: PLIF - L4-L5   (N/A) - PLIF - L4-L5    SURGEON:  Surgeon(s) and Role:    * Earnie Larsson, MD - Primary    * Eustace Moore, MD - Assisting  PHYSICIAN ASSISTANT:   ASSISTANTS:    ANESTHESIA:   general  EBL:  Total I/O In: 1000 [I.V.:1000] Out: 850 [Urine:650; Blood:200]  BLOOD ADMINISTERED:none  DRAINS: none   LOCAL MEDICATIONS USED:  MARCAINE     SPECIMEN:  No Specimen  DISPOSITION OF SPECIMEN:  N/A  COUNTS:  YES  TOURNIQUET:  * No tourniquets in log *  DICTATION: .Dragon Dictation  PLAN OF CARE: Admit to inpatient   PATIENT DISPOSITION:  PACU - hemodynamically stable.   Delay start of Pharmacological VTE agent (>24hrs) due to surgical blood loss or risk of bleeding: yes

## 2016-01-02 NOTE — Anesthesia Procedure Notes (Signed)
Procedure Name: Intubation Date/Time: 01/02/2016 8:17 AM Performed by: Carney Living Pre-anesthesia Checklist: Patient identified, Emergency Drugs available, Suction available, Patient being monitored and Timeout performed Patient Re-evaluated:Patient Re-evaluated prior to inductionOxygen Delivery Method: Circle system utilized Preoxygenation: Pre-oxygenation with 100% oxygen Intubation Type: IV induction Ventilation: Mask ventilation without difficulty and Oral airway inserted - appropriate to patient size Laryngoscope Size: Glidescope Tube type: Oral Tube size: 7.5 mm Number of attempts: 1 Airway Equipment and Method: Stylet Placement Confirmation: ETT inserted through vocal cords under direct vision,  positive ETCO2 and breath sounds checked- equal and bilateral Secured at: 23 cm Tube secured with: Tape Dental Injury: Teeth and Oropharynx as per pre-operative assessment  Comments: DL X1 with glidescope, 7.5 ETT easily placed, +ETC02, BBS=, VSS

## 2016-01-02 NOTE — Progress Notes (Signed)
Pt given 40 units of Novolog 70/30 @ 1600 per MD order. Insulin gtt was D/C'd @ 1755 per MD order. Pt's last CBG was 261. Novolog meal coverage was also given @ 1755 for the 261 CBG. Will continue to monitor Pt. Holli Humbles, RN

## 2016-01-02 NOTE — Progress Notes (Signed)
Inpatient Diabetes Program Recommendations  AACE/ADA: New Consensus Statement on Inpatient Glycemic Control (2015)  Target Ranges:  Prepandial:   less than 140 mg/dL      Peak postprandial:   less than 180 mg/dL (1-2 hours)      Critically ill patients:  140 - 180 mg/dL   Review of Glycemic Control  Diabetes history: DM 2 Outpatient Diabetes medications: Humalog 75/25 60 units Daily, Actos 45 mg Daily Current orders for Inpatient glycemic control: insulin gtt  Inpatient Diabetes Program Recommendations:   Patient reports high 200's-300's at home on current regimen. Patient reports getting samples from PCP for insulin and DM meds due to affordability.  Consider transitioning patient off IV insulin with 70/30 40 units BID, Novolog Moderate and HS correction.  Will probably have to titrate 70/30 tomorrow depending on glucose trend. Wait 2 hours after initiation of SQ insulin to turn insulin gtt off.  Will order DM educational videos and Living Well with DM booklet and reviewed with patient.  Patient is followed by her PCP Markham Jordan at [facility]) Inquired about prior A1C and patient reports that he does not recall his last A1C value. Discussed A1C results (11.9 % on 12/22/2015) and discussed glucose and A1C goals.  Discussed impact of nutrition, exercise, stress, sickness, and medications on diabetes control. Discussed carbohydrates, carbohydrate goals per day and meal, along with portion sizes. Discussed importance of checking CBGs and maintaining good CBG control to prevent long-term and short-term complications. Encouraged patient to check her glucose 4 times/day(before meals and at bedtime) as review by NP and to keep a log book of glucose readings and insulin taken which he will need to take to doctor appointments. Explained how the doctor can use the log book to continue to make insulin adjustments if needed.Spoke to patient about symptoms of hyperglycemia and  hypoglycemia and treatments for both.    Thanks,  Tama Headings RN, MSN, [facility] Inpatient Diabetes Coordinator Team Pager [phone] (8a-5p)

## 2016-01-02 NOTE — Evaluation (Signed)
Physical Therapy Evaluation Patient Details Name: [name] MRN: [id number] DOB: Apr 29, 1946 Today's Date: 01/02/2016   History of Present Illness  Pt is a 70 y/o F who presented w/ Bil LE pain w/ resultant neurogenic claudication due to L4-5 degenerative spondylolisthesis and foraminal stenosis.  Pt is now s/p Bil L4-5 decompressive laminotomy and Bil L4-5 decompressive foraminotomies.  Pt's PMH includes DM, osteoporosis, anxiety, depression, obesity, CHF, MI, Bil TKA.  Clinical Impression  Patient is s/p above surgery resulting in functional limitations due to the deficits listed below (see PT Problem List). [name] presents w/ impaired sensation and balance, and decreased Bil LE strength.  She has a h/o 15 falls in the past 6 months.  She currently requires min assist for bed mobility and min guard assist for transfers and ambulation.  She will have 24/7 assist/supervision available from her daughter and housemate at d/c. Patient will benefit from skilled PT to increase their independence and safety with mobility to allow discharge to the venue listed below.      Follow Up Recommendations Home health PT;Supervision for mobility/OOB    Equipment Recommendations  3in1 (PT) (bariatric 3 in 1)    Recommendations for Other Services OT consult     Precautions / Restrictions Precautions Precautions: Fall;Back Precaution Booklet Issued: No Precaution Comments: Reviewed back precautions w/ pt and daughter Required Braces or Orthoses: Spinal Brace Spinal Brace: Lumbar corset;Applied in sitting position Restrictions Weight Bearing Restrictions: No      Mobility  Bed Mobility Overal bed mobility: Needs Assistance Bed Mobility: Sit to Sidelying;Rolling Rolling: Min assist       Sit to sidelying: Min assist General bed mobility comments: Min assist to facilitate proper sequencing of Bil LEs during log roll w/ verbal cues provided.   Transfers Overall transfer level: Needs  assistance Equipment used: Rolling walker (2 wheeled) Transfers: Sit to/from Stand Sit to Stand: Min guard         General transfer comment: Cues for technique to reach back for bed when sitting for controlled descent.  Ambulation/Gait Ambulation/Gait assistance: Min guard Ambulation Distance (Feet): 100 Feet Assistive device: Rolling walker (2 wheeled) Gait Pattern/deviations: Step-to pattern;Decreased stride length;Decreased dorsiflexion - right;Decreased dorsiflexion - left;Steppage;Wide base of support   Gait velocity interpretation: Below normal speed for age/gender General Gait Details: While ambulating Rt ankle DF to neutral only and performs steppage gait to compensate. (Rt DF MMT strength 5/5).  Will focus on active DF w/ ambulation during future sessions.    Stairs            Wheelchair Mobility    Modified Rankin (Stroke Patients Only)       Balance Overall balance assessment: Needs assistance Sitting-balance support: No upper extremity supported;Feet supported Sitting balance-Leahy Scale: Good     Standing balance support: Bilateral upper extremity supported;During functional activity Standing balance-Leahy Scale: Poor Standing balance comment: RW for support                             Pertinent Vitals/Pain Pain Assessment: 0-10 Pain Score: 6  Pain Location: back, Lt knee Pain Descriptors / Indicators: Aching;Sharp Pain Intervention(s): Limited activity within patient's tolerance;Monitored during session;Repositioned    Home Living Family/patient expects to be discharged to:: Private residence Living Arrangements: Children;Non-relatives/Friends (housemate) Available Help at Discharge: Family;Friend(s);Available 24 hours/day Type of Home: Apartment Home Access: Ramped entrance     Home Layout: One level Home Equipment: Walker - 2 wheels;Cane -  single point Additional Comments: Daughter and housemate will be able to provide 24/7  assist    Prior Function Level of Independence: Needs assistance   Gait / Transfers Assistance Needed: HHA for ambulation, +1 assist for tub transfer  ADL's / Homemaking Assistance Needed: When pt did not transfer into shower w/ assist from daughter she would take a sponge bath, ocassionally requiring assist from daughter.          Hand Dominance        Extremity/Trunk Assessment   Upper Extremity Assessment: Defer to OT evaluation           Lower Extremity Assessment: RLE deficits/detail;LLE deficits/detail RLE Deficits / Details: knee flexion and extension 3/5 (Of significant note: DF strength 5/5  w/ MMT) LLE Deficits / Details: knee flexion and extension 4/5  Cervical / Trunk Assessment: Other exceptions  Communication   Communication: No difficulties  Cognition Arousal/Alertness: Awake/alert Behavior During Therapy: WFL for tasks assessed/performed Overall Cognitive Status: Within Functional Limits for tasks assessed                      General Comments General comments (skin integrity, edema, etc.): Pt reports 15 falls over the past 6 months from tripping.  Emphasis will be placed on DF w/ ambulation and compensatory strategies if necessary.    Exercises        Assessment/Plan    PT Assessment Patient needs continued PT services  PT Diagnosis Difficulty walking;Acute pain   PT Problem List Decreased strength;Decreased activity tolerance;Decreased balance;Decreased mobility;Decreased knowledge of use of DME;Decreased safety awareness;Decreased knowledge of precautions;Impaired sensation;Pain  PT Treatment Interventions DME instruction;Gait training;Functional mobility training;Therapeutic activities;Therapeutic exercise;Balance training;Neuromuscular re-education;Patient/family education   PT Goals (Current goals can be found in the Care Plan section) Acute Rehab PT Goals Patient Stated Goal: to be able to go the mall and to festivals PT Goal  Formulation: With patient/family Time For Goal Achievement: 01/12/16 Potential to Achieve Goals: Good    Frequency Min 5X/week   Barriers to discharge        Co-evaluation               End of Session Equipment Utilized During Treatment: Back brace Activity Tolerance: Patient tolerated treatment well;Patient limited by fatigue Patient left: in bed;with call bell/phone within reach;with family/visitor present;with SCD's reapplied Nurse Communication: Mobility status         Time: [id number] PT Time Calculation (min) (ACUTE ONLY): 19 min   Charges:   PT Evaluation $PT Eval Moderate Complexity: 1 Procedure     PT G Codes:       Collie Siad PT, DPT  Pager: [phone] Phone: [phone] 01/02/2016, 4:41 PM

## 2016-01-02 NOTE — Progress Notes (Signed)
Pt given Living Well with Diabetes booklet and has began viewing the diabetes education videos. Will continue to educate Pt and family. Holli Humbles, RN

## 2016-01-02 NOTE — Anesthesia Preprocedure Evaluation (Addendum)
Anesthesia Evaluation  Patient identified by MRN, date of birth, ID band Patient awake    Reviewed: Allergy & Precautions, NPO status , Patient's Chart, lab work & pertinent test results  History of Anesthesia Complications Negative for: history of anesthetic complications  Airway Mallampati: III  TM Distance: >3 FB Neck ROM: Full    Dental no notable dental hx. (+) Dental Advisory Given, Chipped, Poor Dentition, Missing   Pulmonary sleep apnea ,    Pulmonary exam normal breath sounds clear to auscultation       Cardiovascular + Past MI and +CHF  Normal cardiovascular exam Rhythm:Regular Rate:Normal  Evaluated and cleared by cardiology Echo 2009: Overall left ventricular systolic function was normal. Left    ventricular ejection fraction was estimated to be 60 %. Left    ventricular wall thickness was mildly increased. - The aortic valve was mildly calcified.    Neuro/Psych PSYCHIATRIC DISORDERS Anxiety Depression negative neurological ROS     GI/Hepatic Neg liver ROS, GERD  Medicated and Controlled,  Endo/Other  diabetes, Insulin DependentMorbid obesity  Renal/GU negative Renal ROS  negative genitourinary   Musculoskeletal  (+) Arthritis ,   Abdominal   Peds negative pediatric ROS (+)  Hematology negative hematology ROS (+)   Anesthesia Other Findings   Reproductive/Obstetrics negative OB ROS                         Anesthesia Physical Anesthesia Plan  ASA: III  Anesthesia Plan: General   Post-op Pain Management:    Induction: Intravenous  Airway Management Planned: Oral ETT and Video Laryngoscope Planned  Additional Equipment:   Intra-op Plan:   Post-operative Plan: Extubation in OR  Informed Consent: I have reviewed the patients History and Physical, chart, labs and discussed the procedure including the risks, benefits and alternatives for the proposed  anesthesia with the patient or authorized representative who has indicated his/her understanding and acceptance.   Dental advisory given  Plan Discussed with: CRNA, Anesthesiologist and Surgeon  Anesthesia Plan Comments:       Anesthesia Quick Evaluation

## 2016-01-02 NOTE — H&P (Signed)
[name] is an 70 y.o. female.   Chief Complaint: Back and leg pain HPI: 70 year old female with intractable back and bilateral lower extremity pain with resultant neurogenic claudication. 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 Patient has failed conservative management and presents now for L4-5 decompressive laminotomies and foraminotomies followed by posterior lumbar interbody fusion utilizing interbody expandable cages and pedicle screw fixation.  Past Medical History  Diagnosis Date  . Hyperlipidemia   . Diabetes mellitus without complication ([place])   . Arthritis   . Osteoporosis   . Thyroid nodule   . Anxiety   . Depression   . Osteoporosis   . Cystocele   . Chronic pain syndrome   . GERD (gastroesophageal reflux disease)   . Back pain   . Obesity   . Amnesia   . CHF (congestive heart failure) ([place])   . IBS (irritable bowel syndrome)   . Shortness of breath dyspnea   . Sleep apnea     not using now  . Myocardial infarction [facility])     Past Surgical History  Procedure Laterality Date  . Appendectomy    . Cholecystectomy    . Abdominal hysterectomy    . Oophorectomy    . Replacement total knee    . Foot surgery    . Tonsillectomy    . Cardiac catheterization      Family History  Problem Relation Age of Onset  . Cancer Mother   . Cancer Father   . Cancer Sister   . Thyroid disease Neg Hx    Social History:  reports that she has never smoked. Her smokeless tobacco use includes Chew. She reports that she does not drink alcohol. Her drug history is not on file.  Allergies: No Known Allergies  Medications Prior to Admission  Medication Sig Dispense Refill  . busPIRone (BUSPAR) 30 MG tablet Take 30 mg by mouth 2 (two) times daily.    . clonazePAM (KLONOPIN) 1 MG tablet Take 1 mg by mouth 3 (three) times daily.    . cyclobenzaprine (FLEXERIL) 10 MG tablet Take 10 mg by mouth 3  (three) times daily.    Marland Kitchen doxepin (SINEQUAN) 25 MG capsule Take 25 mg by mouth at bedtime.    . furosemide (LASIX) 40 MG tablet Take 40 mg by mouth 2 (two) times daily.    . insulin lispro (HUMALOG) 100 UNIT/ML injection Inject 60 Units into the skin daily.     . metoprolol succinate (TOPROL-XL) 25 MG 24 hr tablet Take 25 mg by mouth daily.    Marland Kitchen nystatin (MYCOSTATIN) powder Apply 1 Bottle topically 2 (two) times daily. Breast/stomach folds    . Oxycodone HCl 20 MG TABS Take 20 mg by mouth 4 (four) times daily.     . pioglitazone (ACTOS) 45 MG tablet Take 45 mg by mouth daily.    Marland Kitchen tolterodine (DETROL LA) 4 MG 24 hr capsule Take 1 capsule (4 mg total) by mouth daily. Start with 1/2 tablet daily x 1 week, then increase to whole tablet if needed. (Patient taking differently: Take 4 mg by mouth daily. ) 30 capsule 12    Results for orders placed or performed during the hospital encounter of 01/02/16 (from the past 48 hour(s))  Glucose, capillary     Status: Abnormal   Collection Time: 01/02/16  6:29 AM  Result Value Ref Range   Glucose-Capillary 426 (H) 65 - 99 mg/dL   Comment 1  Notify RN    Comment 2 Document in Chart   Glucose, capillary     Status: Abnormal   Collection Time: 01/02/16  7:27 AM  Result Value Ref Range   Glucose-Capillary 412 (H) 65 - 99 mg/dL   No results found.  Pertinent items noted in HPI and remainder of comprehensive ROS otherwise negative.  Blood pressure 157/97, pulse 114, temperature 98.1 F (36.7 C), temperature source Oral, resp. rate 20, weight 124.739 kg (275 lb), SpO2 95 %.  The patient is overweight. She is awake and alert. She is oriented and appropriate. Her cranial nerve function is intact. Her motor examination of her extremities is normal. Sensory examination is nonfocal with some distal sensory loss in both upper and lower extremities. Deep tendon reflexes are hypoactive but symmetric. No evidence of long track signs. Gait is antalgic. Posterior mildly  flexed. Examination head ears eyes and throat is unremarkable. Chest and abdomen are benign. Extremities are free from injury deformity. Assessment/Plan L4-L5 grade 1 degenerative spondylolisthesis with stenosis. Plan bilateral L4-5 decompressive laminotomies and foraminotomies followed by posterior lumbar interbody fusion utilizing interbody expandable cages, locally harvested autograft, and augmented with posterior lateral arthrodesis utilizing nonsegmental pedicle screw fixation with local autografting. Risks and benefits of been explained. Patient wishes to proceed.  Krystine Pabst A 01/02/2016, 7:46 AM

## 2016-01-02 NOTE — Op Note (Signed)
Date of procedure: 01/02/2016  Date of dictation: Same  Service: Neurosurgery  Preoperative diagnosis: L4-L5 grade 1 degenerative spondylolisthesis with stenosis and neurogenic claudication  Postoperative diagnosis: Same  Procedure Name: Bilateral L4-L5 decompressive laminotomy with bilateral L4 and L5 decompressive foraminotomies, more than would be required for simple interbody fusion alone.  L4-L5 posterior lumbar interbody fusion utilizing interbody expandable cages and local autograft.  L4-L5 posterior lateral arthrodesis utilizing nonsegmental pedicle screw fixation and local autograft.  Surgeon:Audreanna Torrisi A.Verenice Westrich, M.D.  Asst. Surgeon: Ronnald Ramp  Anesthesia: General  Indication: 70 year old female with multiple medical problems presents with severe lower back pain with radiation into both lower extremities consistent with neurogenic claudication and failing conservative management. Workup demonstrates evidence of a mobile grade 1 L4-5 degenerative spondylolisthesis with marked foraminal and lateral recess stenosis. Patient has failed conservative management and presents now for decompression and fusion in hopes of improving her symptoms.  Operative note: After induction of anesthesia, patient position prone onto Wilson frame and appropriately padded. Lumbar region prepped and draped sterilely. Incision made overlying L4-5. Dissection performed bilaterally. Retractor placed. Fluoroscopy used. Levels confirmed. Decompressive laminotomies and foraminotomies then performed using high-speed drill and Kerrison rongeurs to remove the inferior aspect of the lamina of L4. The entire inferior facet and pars interarticularis of L4 bilaterally. The majority of the superior facet of L5 bilaterally. The superior margin of the L5 lamina. Ligament flavum was elevated and resected in piecemeal fashion. Decompressive foraminotomies were completed along the course the exiting L4 and L5 nerve roots bilaterally.  Epidural venous plexus was quite related and cut. Bilateral discectomies were then performed. Disc spaces and distracted and with a 9 mm distractor in the patient's left side disc space was prepared on the right side for interbody fusion. An 8 mm x 12 [employer] expandable cage packed with morselized autograft was impacted into place and recessed approximately 2-3 mm and posterior cortical margin. She cage was then expanded to its full extent. Distractor was removed patient's left side. Interspace was prepared on the left side. Soft tissue was removed and interspace. Morselize autograft was packed in the interspace. A second cage was impacted into place and expanded fully. Pedicles of L4 and L5 were then verified using surface landmarks and intraoperative fluoroscopy. Superficial bone overlying the pedicle was removed using the high-speed drill. Each pedicles and probed using pedicle awl each pedicle awl track was probed and found to be solidly within the bone. Each pedicle awl track was tapped and then a 6.75 x 45 mm radius brand screws from [employer] was placed bilaterally. Good position of the implants was confirmed using AP and lateral fluoroscopy. Wound is then irrigated out like solution. Transverse processes and residual facet were decorticated using high-speed drill. Morselized autograft was packed posterior laterally. Short segment titanium rods placed over the screws at L4 and L5. Locking caps and placed over the screw heads. Locking caps were then engaged with the construct under compression. Wound is then irrigated one final time. Gelfoam was placed over the laminotomy defects. Vancomycin powder was placed the deep wound space. Wounds and close in layers with Vicryl sutures. Steri-Strips and sterile dressing were applied. There were no apparent palpitations. Patient tolerated the procedure well and she returns to the recovery room postop.

## 2016-01-02 NOTE — Transfer of Care (Signed)
Immediate Anesthesia Transfer of Care Note  Patient: [name]  Procedure(s) Performed: Procedure(s) with comments: PLIF - L4-L5   (N/A) - PLIF - L4-L5    Patient Location: PACU  Anesthesia Type:General  Level of Consciousness: awake, alert , oriented and patient cooperative  Airway & Oxygen Therapy: Patient Spontanous Breathing and Patient connected to nasal cannula oxygen  Post-op Assessment: Report given to RN, Post -op Vital signs reviewed and stable and Patient moving all extremities X 4  Post vital signs: Reviewed and stable  Last Vitals:  Filed Vitals:   01/02/16 0626  BP: 157/97  Pulse: 114  Temp: 36.7 C  Resp: 20    Complications: No apparent anesthesia complications

## 2016-01-02 NOTE — Care Management (Signed)
Utilization review completed. Ryley Bachtel, RN Case Manager [phone]. 

## 2016-01-02 NOTE — Anesthesia Postprocedure Evaluation (Signed)
Anesthesia Post Note  Patient: [name]  Procedure(s) Performed: Procedure(s) (LRB): PLIF - L4-L5   (N/A)  Patient location during evaluation: PACU Anesthesia Type: General Level of consciousness: awake and alert and oriented Pain management: pain level controlled Vital Signs Assessment: post-procedure vital signs reviewed and stable Respiratory status: spontaneous breathing, nonlabored ventilation, respiratory function stable and patient connected to nasal cannula oxygen Cardiovascular status: blood pressure returned to baseline and stable Postop Assessment: no signs of nausea or vomiting and patient able to bend at knees Anesthetic complications: no    Last Vitals:  Filed Vitals:   01/02/16 1115 01/02/16 1130  BP: 136/73 110/70  Pulse: 111 116  Temp:  36.8 C  Resp: 22 23    Last Pain:  Filed Vitals:   01/02/16 1138  PainSc: 7     LLE Motor Response: Purposeful movement (01/02/16 1130) LLE Sensation: Full sensation (01/02/16 1130) RLE Motor Response: Purposeful movement (01/02/16 1130) RLE Sensation: Full sensation (01/02/16 1130)      Brok Stocking A.

## 2016-01-03 DIAGNOSIS — E785 Hyperlipidemia, unspecified: Secondary | ICD-10-CM

## 2016-01-03 DIAGNOSIS — M4310 Spondylolisthesis, site unspecified: Secondary | ICD-10-CM

## 2016-01-03 DIAGNOSIS — F418 Other specified anxiety disorders: Secondary | ICD-10-CM

## 2016-01-03 DIAGNOSIS — E118 Type 2 diabetes mellitus with unspecified complications: Secondary | ICD-10-CM

## 2016-01-03 DIAGNOSIS — R000 Tachycardia, unspecified: Secondary | ICD-10-CM

## 2016-01-03 DIAGNOSIS — E119 Type 2 diabetes mellitus without complications: Secondary | ICD-10-CM

## 2016-01-03 DIAGNOSIS — K219 Gastro-esophageal reflux disease without esophagitis: Secondary | ICD-10-CM

## 2016-01-03 DIAGNOSIS — G894 Chronic pain syndrome: Secondary | ICD-10-CM

## 2016-01-03 LAB — GLUCOSE, CAPILLARY
Glucose-Capillary: 299 mg/dL — ABNORMAL HIGH (ref 65–99)
Glucose-Capillary: 335 mg/dL — ABNORMAL HIGH (ref 65–99)
Glucose-Capillary: 352 mg/dL — ABNORMAL HIGH (ref 65–99)
Glucose-Capillary: 318 mg/dL — ABNORMAL HIGH (ref 65–99)
Glucose-Capillary: 268 mg/dL — ABNORMAL HIGH (ref 65–99)

## 2016-01-03 MED ORDER — DIPHENHYDRAMINE HCL 25 MG PO CAPS
25.0000 mg | ORAL_CAPSULE | ORAL | Status: DC | PRN
  Administered 2016-01-04 (×2): 25 mg via ORAL
  Filled 2016-01-03 (×2): qty 1

## 2016-01-03 MED ORDER — INSULIN ASPART PROT & ASPART (70-30 MIX) 100 UNIT/ML ~~LOC~~ SUSP
50.0000 [IU] | Freq: Two times a day (BID) | SUBCUTANEOUS | Status: DC
  Administered 2016-01-03 – 2016-01-04 (×3): 50 [IU] via SUBCUTANEOUS

## 2016-01-03 MED ORDER — METOPROLOL TARTRATE 25 MG PO TABS
25.0000 mg | ORAL_TABLET | Freq: Once | ORAL | Status: AC
  Administered 2016-01-03: 25 mg via ORAL
  Filled 2016-01-03: qty 1

## 2016-01-03 MED ORDER — METOPROLOL SUCCINATE ER 25 MG PO TB24
50.0000 mg | ORAL_TABLET | Freq: Every day | ORAL | Status: DC
  Administered 2016-01-04: 50 mg via ORAL
  Filled 2016-01-03: qty 2

## 2016-01-03 MED FILL — Sodium Chloride IV Soln 0.9%: INTRAVENOUS | Qty: 1000 | Status: AC

## 2016-01-03 MED FILL — Heparin Sodium (Porcine) Inj 1000 Unit/ML: INTRAMUSCULAR | Qty: 30 | Status: AC

## 2016-01-03 NOTE — Progress Notes (Signed)
Physical Therapy Treatment Patient Details Name: [name] MRN: [id number] DOB: [birth year] Today's Date: 01/03/2016    History of Present Illness Pt is a 70 y/o F who presented w/ Bil LE pain w/ resultant neurogenic claudication due to L4-5 degenerative spondylolisthesis and foraminal stenosis.  Pt is now s/p Bil L4-5 decompressive laminotomy and Bil L4-5 decompressive foraminotomies.  Pt's PMH includes DM, osteoporosis, anxiety, depression, obesity, CHF, MI, Bil TKA.    PT Comments    Pt progressing towards physical therapy goals. Was able to perform transfers and ambulation with close guard for safety. Pt reports at beginning of session that she does not feel comfortable returning home at this time. Discussed options for rehab but pt declined. Do feel this pt would benefit from at least 1 more day to work with PT prior to d/c home for increased safety and independence. Will continue to follow.   Follow Up Recommendations  Home health PT;Supervision for mobility/OOB     Equipment Recommendations  3in1 (PT);Rolling walker with 5" wheels (Bariatric if able)    Recommendations for Other Services       Precautions / Restrictions Precautions Precautions: Fall;Back Precaution Booklet Issued: No Precaution Comments: Pt able to recall 2/3 precautions at start of session. Reviewed all precautions with pt. Required Braces or Orthoses: Spinal Brace Spinal Brace: Lumbar corset;Applied in sitting position Restrictions Weight Bearing Restrictions: No    Mobility  Bed Mobility Overal bed mobility: Needs Assistance Bed Mobility: Rolling;Sidelying to Sit Rolling: Min guard Sidelying to sit: Min assist (assist provided at trunk to come into sitting)       General bed mobility comments: Pt sitting up in recliner upon PT arrival.   Transfers Overall transfer level: Needs assistance Equipment used: Rolling walker (2 wheeled) Transfers: Sit to/from Stand Sit to Stand: Min guard          General transfer comment: Close guard for safety. Pt was able to stand with slight flexion of the trunk - VC's to correct.   Ambulation/Gait Ambulation/Gait assistance: Min guard Ambulation Distance (Feet): 200 Feet Assistive device: Rolling walker (2 wheeled) Gait Pattern/deviations: Step-through pattern;Decreased stride length;Trunk flexed Gait velocity: Decreased Gait velocity interpretation: Below normal speed for age/gender General Gait Details: VC's for improved posture, increased DF on R, and increased heel strike bilaterally. Pt moving generally slow and guarded but no LOB noted. Hands-on guarding provided for safety.    Stairs            Wheelchair Mobility    Modified Rankin (Stroke Patients Only)       Balance Overall balance assessment: Needs assistance;History of Falls Sitting-balance support: Feet supported;No upper extremity supported Sitting balance-Leahy Scale: Good     Standing balance support: No upper extremity supported;During functional activity Standing balance-Leahy Scale: Fair Standing balance comment: Static standing at edge of chair prior to initiating stand>sit.                     Cognition Arousal/Alertness: Lethargic Behavior During Therapy: Flat affect Overall Cognitive Status: Within Functional Limits for tasks assessed       Memory: Decreased recall of precautions;Decreased short-term memory              Exercises      General Comments General comments (skin integrity, edema, etc.): Pt reports 15 falls over the past 6 months from tripping. Discussed safety in home environment, wearing shoes, and focusing on heel strike/increased DF.       Pertinent Vitals/Pain Pain  Assessment: Faces Pain Score: 8  Faces Pain Scale: Hurts little more Pain Location: Back Pain Descriptors / Indicators: Operative site guarding Pain Intervention(s): Limited activity within patient's tolerance;Monitored during session;Repositioned     Home Living Family/patient expects to be discharged to:: Private residence Living Arrangements: Children;Non-relatives/Friends (housemate) Available Help at Discharge: Family;Friend(s);Available 24 hours/day Type of Home: Apartment Home Access: Ramped entrance   Home Layout: One level Home Equipment: Walker - 2 wheels;Cane - single point Additional Comments: Daughter and housemate will be able to provide 24/7 assist    Prior Function Level of Independence: Needs assistance  Gait / Transfers Assistance Needed: HHA for ambulation, +1 assist for tub transfer ADL's / Homemaking Assistance Needed: When pt did not transfer into shower w/ assist from daughter she would take a sponge bath, ocassionally requiring assist from daughter for bathing/dressing.       PT Goals (current goals can now be found in the care plan section) Acute Rehab PT Goals Patient Stated Goal: go home PT Goal Formulation: With patient/family Time For Goal Achievement: 01/12/16 Potential to Achieve Goals: Good Progress towards PT goals: Progressing toward goals    Frequency  Min 5X/week    PT Plan Current plan remains appropriate    Co-evaluation             End of Session Equipment Utilized During Treatment: Back brace;Gait belt Activity Tolerance: Patient tolerated treatment well Patient left: in chair;with call bell/phone within reach     Time: 0759-0823 PT Time Calculation (min) (ACUTE ONLY): 24 min  Charges:  $Gait Training: 23-37 mins                    G Codes:      Rolinda Roan 01-25-2016, 9:15 AM   Rolinda Roan, PT, DPT Acute Rehabilitation Services Pager: [phone]

## 2016-01-03 NOTE — Evaluation (Signed)
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  Pt is now s/p Bil L4-5 decompressive laminotomy and Bil L4-5 decompressive foraminotomies.  Pt's PMH includes DM, osteoporosis, anxiety, depression, obesity, CHF, MI, Bil TKA.   Clinical Impression   Pt reports she required min assist with ADLs PTA. Currently pt is overall min guard for functional mobility and max assist for ADLs. Pt able to recall 0/3 back precautions at start of session, reviewed all precautions and in relation to functional activities. Pt requiring verbal cues throughout functional activities to maintain precautions. Discussed possible SNF placement for further rehab prior to return home; pt not agreeable stating that she feels like her daughter will be able to manage her current needs upon d/c home. Recommending HHOT for follow up in order to maximize independence and safety with ADLs and functional mobility. Pt would benefit from continued skilled OT to address established goals.    Follow Up Recommendations  Home health OT;Supervision/Assistance - 24 hour    Equipment Recommendations  3 in 1 bedside comode;Tub/shower seat    Recommendations for Other Services       Precautions / Restrictions Precautions Precautions: Fall;Back Precaution Comments: Pt able to recall 0/3 precautions at start of session. Reviewed all precautions with pt. Required Braces or Orthoses: Spinal Brace Spinal Brace: Lumbar corset;Applied in sitting position Restrictions Weight Bearing Restrictions: No      Mobility Bed Mobility Overal bed mobility: Needs Assistance Bed Mobility: Rolling;Sidelying to Sit Rolling: Min guard Sidelying to sit: Min assist (assist provided at trunk to come  into sitting)       General bed mobility comments: VCs throughout for sequencing. HOB flat with heavy use of rails.  Transfers Overall transfer level: Needs assistance Equipment used: Rolling walker (2 wheeled) Transfers: Sit to/from Stand Sit to Stand: Min guard         General transfer comment: VCs for technique to maintain back precautions. VCs for hand placement with sit to stand from EOB x 1, BSC x 1.    Balance Overall balance assessment: Needs assistance;History of Falls Sitting-balance support: Feet supported;No upper extremity supported Sitting balance-Leahy Scale: Good     Standing balance support: No upper extremity supported;During functional activity Standing balance-Leahy Scale: Fair Standing balance comment: Pt able to stand at sink and complete grooming activities without UE support                            ADL Overall ADL's : Needs assistance/impaired Eating/Feeding: Set up;Sitting   Grooming: Min guard;Standing;Oral care;Wash/dry face;Wash/dry Nurse, mental health Details (indicate cue type and reason): Educated pt on use of 2 cups for oral care. Upper Body Bathing: Minimal assitance;Sitting   Lower Body Bathing: Maximal assistance;Sit to/from stand   Upper Body Dressing : Maximal assistance;Sitting Upper Body Dressing Details (indicate cue type and reason): to don brace Lower Body Dressing: Maximal assistance;Sit to/from stand Lower Body Dressing Details (indicate cue type and reason): Pt unable to cross one foot over opposite knee. Pt reports daughter can assist with LB ADLs upon d/c home-no need for AE. Toilet Transfer: Min guard;Ambulation;Cueing for safety;BSC;RW (BSC over toilet) Toilet Transfer Details (indicate cue type and reason): cues for maintaining precautions and safety with RW Toileting- Clothing Manipulation  and Hygiene: Maximal assistance;Sit to/from stand Toileting - Clothing Manipulation Details (indicate cue type and reason):  Pt reports daughter can assist with peri care as needed.   Tub/Shower Transfer Details (indicate cue type and reason): Pt reports she sometimes sponge bathes when she has difficutly getting in shower; reports she will sponge bathe if necessary. Educated on use of shower chair for safety with bathing; pt reports she has access to one that she can use upon d/c home. Functional mobility during ADLs: Min guard;Rolling walker;Cueing for safety General ADL Comments: No family present for OT eval. Pt reports history of ~15 falls within the past year and presenting with difficulty recalling precautions. Pt reports someone will be available to assist 24/7 upon return home. Discussed possible SNF placement for further rehab; pt not agreeable stating tha she has been to a SNF before and really wants to go home this time and feels her daughter can manage assisting with her current needs.     Vision Vision Assessment?: No apparent visual deficits   Perception     Praxis      Pertinent Vitals/Pain Pain Assessment: 0-10 Pain Score: 8  Pain Location: back Pain Descriptors / Indicators: Aching Pain Intervention(s): Limited activity within patient's tolerance;Monitored during session;Repositioned     Hand Dominance     Extremity/Trunk Assessment Upper Extremity Assessment Upper Extremity Assessment: Overall WFL for tasks assessed   Lower Extremity Assessment Lower Extremity Assessment: Defer to PT evaluation   Cervical / Trunk Assessment Cervical / Trunk Assessment: Other exceptions Cervical / Trunk Exceptions: s/p surgery listed above, obesity   Communication Communication Communication: No difficulties   Cognition Arousal/Alertness: Lethargic Behavior During Therapy: Flat affect Overall Cognitive Status: Within Functional Limits for tasks assessed       Memory: Decreased recall of precautions;Decreased short-term memory             General Comments       Exercises        Shoulder Instructions      Home Living Family/patient expects to be discharged to:: Private residence Living Arrangements: Children;Non-relatives/Friends (housemate) Available Help at Discharge: Family;Friend(s);Available 24 hours/day Type of Home: Apartment Home Access: Ramped entrance     Home Layout: One level     Bathroom Shower/Tub: Tub/shower unit;Walk-in shower (pt reports she typically uses walk in)   [employer] Toilet: Standard Bathroom Accessibility: Yes How Accessible: Accessible via walker Home Equipment: [place] - 2 wheels;Cane - single point   Additional Comments: Daughter and housemate will be able to provide 24/7 assist      Prior Functioning/Environment Level of Independence: Needs assistance  Gait / Transfers Assistance Needed: HHA for ambulation, +1 assist for tub transfer ADL's / Homemaking Assistance Needed: When pt did not transfer into shower w/ assist from daughter she would take a sponge bath, ocassionally requiring assist from daughter for bathing/dressing.          OT Diagnosis: Generalized weakness;Acute pain   OT Problem List: Decreased strength;Decreased activity tolerance;Impaired balance (sitting and/or standing);Decreased safety awareness;Decreased knowledge of use of DME or AE;Decreased knowledge of precautions;Obesity;Pain   OT Treatment/Interventions: Self-care/ADL training;Energy conservation;DME and/or AE instruction;Therapeutic activities;Patient/family education;Balance training    OT Goals(Current goals can be found in the care plan section) Acute Rehab OT Goals Patient Stated Goal: go home OT Goal Formulation: With patient Time For Goal Achievement: 01/17/16 Potential to Achieve Goals: Fair ADL Goals Pt Will Perform Grooming: with supervision;standing Pt Will Perform Upper Body Bathing: with supervision;sitting Pt Will Perform Lower Body  Bathing: with min assist;sit to/from stand Pt Will Transfer to Toilet: with  supervision;ambulating;bedside commode (over toilet) Pt Will Perform Toileting - Clothing Manipulation and hygiene: with min assist;sit to/from stand Pt Will Perform Tub/Shower Transfer: Shower transfer;with supervision;ambulating;shower seat;rolling walker Additional ADL Goal #1: Pt will independently don/doff back brace for increased safety with ADLs and functional mobility. Additional ADL Goal #2: Pt will independently verbalize 3/3 back precautions and maintain throughout ADL activity.  OT Frequency: Min 2X/week   Barriers to D/C:            Co-evaluation              End of Session Equipment Utilized During Treatment: Gait belt;Rolling walker;Back brace Nurse Communication: Mobility status;Other (comment) (needs 3 in 1 and HHOT; pt refusing SNF)  Activity Tolerance: Patient tolerated treatment well Patient left: in chair;with call bell/phone within reach   Time: 0735-0752 OT Time Calculation (min): 17 min Charges:  OT General Charges $OT Visit: 1 Procedure OT Evaluation $OT Eval Moderate Complexity: 1 Procedure G-Codes:     Binnie Kand M.S., OTR/L Pager: [phone]  01/03/2016, 8:14 AM

## 2016-01-03 NOTE — Progress Notes (Addendum)
Inpatient Diabetes Program Recommendations  AACE/ADA: New Consensus Statement on Inpatient Glycemic Control (2015)  Target Ranges:  Prepandial:   less than 140 mg/dL      Peak postprandial:   less than 180 mg/dL (1-2 hours)      Critically ill patients:  140 - 180 mg/dL   Review of Glycemic Control  Diabetes history: DM 2 Outpatient Diabetes medications: 75/25 60 units Daily, Amaryl 45 mg Daily Current orders for Inpatient glycemic control: 70/30 40 units BID, Novolog moderate + HS scale  Inpatient Diabetes Program Recommendations:   Consider increasing 70/30 dose to 46-48 units BID. Fasting glucose still elevated at 299 mg/dl this am after 40 units administered yesterday.  Thanks,  Tama Headings RN, MSN, [facility] Inpatient Diabetes Coordinator Team Pager [phone] (8a-5p)

## 2016-01-03 NOTE — Progress Notes (Signed)
[facility] PROGRESS NOTE   [name] [id number] DOB: Mar 24, 1946 DOA: 01/02/2016 PCP: Lorelee Market, MD  HPI/Subjective: Seen sitting in on chair at bedside, denies any complaints. Reported she can't afford some medications, once her PCP to adjust her insulin, because she gets free samples from her PCP.  Assessment/Plan: Principal Problem:   Diabetes mellitus ([place]) Active Problems:   Hyperlipidemia   Anxiety and depression   GERD (gastroesophageal reflux disease)   Chronic pain syndrome   Degenerative spondylolisthesis   Tachycardia   Diabetes mellitus with complication (HCC)   Essential hypertension   Diabetes Uncontrolled Hemoglobin A1c 11.9 10 days ago. Reported sugar uncontrolled at home as well. She is on Humalog/NPH mix, I wanted to switch to Lantus, patient was hesitant because she gets free samples from her PCP. She asked me to let her PCP adjust her insulin medications. Continue SSI while she was in the hospital.  Tachycardia Likely related to uncontrolled diabetes in the setting of recent surgery. Rate 114 on exam. No chest pain Increase home metoprolol dose.  Hypertension Blood pressure on the high-end of normal. Likely related to above as well as missing her beta blocker dose Reasonable blood pressure control.  Code Status: No Order Family Communication: Plan discussed with the patient. Disposition Plan: Remains inpatient Diet: Diet heart healthy/carb modified Room service appropriate?: Yes; Fluid consistency:: Thin  Consultants:    Procedures:    Antibiotics:     Objective: Filed Vitals:   01/03/16 0826 01/03/16 1253  BP: 139/62 145/69  Pulse: 124 111  Temp: 98.4 F (36.9 C) 99.8 F (37.7 C)  Resp: 20 20    Intake/Output Summary (Last 24 hours) at 01/03/16 1519 Last data filed at 01/03/16 0900  Gross per 24 hour  Intake    840 ml  Output   1600 ml  Net   -760 ml   Filed Weights   01/02/16 0626  Weight:  124.739 kg (275 lb)    Exam: General: Alert and awake, oriented x3, not in any acute distress. HEENT: anicteric sclera, pupils reactive to light and accommodation, EOMI CVS: S1-S2 clear, no murmur rubs or gallops Chest: clear to auscultation bilaterally, no wheezing, rales or rhonchi Abdomen: soft nontender, nondistended, normal bowel sounds, no organomegaly Extremities: no cyanosis, clubbing or edema noted bilaterally Neuro: Cranial nerves II-XII intact, no focal neurological deficits  Data Reviewed: Basic Metabolic Panel:  Recent Labs Lab 01/02/16 1300  NA 136  K 4.8  CL 101  CO2 23  GLUCOSE 287*  BUN 19  CREATININE 0.78  CALCIUM 8.8*   Liver Function Tests: No results for input(s): AST, ALT, ALKPHOS, BILITOT, PROT, ALBUMIN in the last 168 hours. No results for input(s): LIPASE, AMYLASE in the last 168 hours. No results for input(s): AMMONIA in the last 168 hours. CBC: No results for input(s): WBC, NEUTROABS, HGB, HCT, MCV, PLT in the last 168 hours. Cardiac Enzymes: No results for input(s): CKTOTAL, CKMB, CKMBINDEX, TROPONINI in the last 168 hours. BNP (last 3 results) No results for input(s): BNP in the last 8760 hours.  ProBNP (last 3 results) No results for input(s): PROBNP in the last 8760 hours.  CBG:  Recent Labs Lab 01/02/16 2003 01/02/16 2354 01/03/16 0558 01/03/16 0802 01/03/16 1304  GLUCAP 235* 193* 299* 335* 352*    Micro No results found for this or any previous visit (from the past 240 hour(s)).   Studies: Dg Lumbar Spine 2-3 Views  01/02/2016  CLINICAL DATA:  Status post posterior fusion  of L4-5. EXAM: LUMBAR SPINE - 2-3 VIEW; DG C-ARM 61-120 MIN COMPARISON:  None. FLUOROSCOPY TIME:  46 seconds. FINDINGS: Status post surgical posterior fusion of L4-5 with bilateral intrapedicular screw placements. Interbody fusion is noted. Good alignment of vertebral bodies is noted. IMPRESSION: Status post surgical posterior fusion of L4-5. Electronically  Signed   By: Marijo Conception, M.D.   On: 01/02/2016 10:28   Dg C-arm 1-60 Min  01/02/2016  CLINICAL DATA:  Status post posterior fusion of L4-5. EXAM: LUMBAR SPINE - 2-3 VIEW; DG C-ARM 61-120 MIN COMPARISON:  None. FLUOROSCOPY TIME:  46 seconds. FINDINGS: Status post surgical posterior fusion of L4-5 with bilateral intrapedicular screw placements. Interbody fusion is noted. Good alignment of vertebral bodies is noted. IMPRESSION: Status post surgical posterior fusion of L4-5. Electronically Signed   By: Marijo Conception, M.D.   On: 01/02/2016 10:28    Scheduled Meds: . busPIRone  30 mg Oral BID  . doxepin  25 mg Oral QHS  . fesoterodine  8 mg Oral Daily  . furosemide  40 mg Oral BID  . insulin aspart  0-15 Units Subcutaneous TID WC  . insulin aspart  0-5 Units Subcutaneous QHS  . insulin aspart protamine- aspart  40 Units Subcutaneous BID WC  . metoprolol succinate  25 mg Oral Daily  . sodium chloride flush  3 mL Intravenous Q12H   Continuous Infusions: . sodium chloride         Time spent: 35 minutes    [facility] A  [facility] Pager [phone] If 7PM-7AM, please contact night-coverage at [email or web address], password [facility] 01/03/2016, 3:19 PM  LOS: 1 day

## 2016-01-03 NOTE — Care Management Note (Signed)
Case Management Note  Patient Details  Name: [name] MRN: [id number] Date of Birth: 04-16-1946  Subjective/Objective:    70 yr old female s/p Bilateral L4-L5 decompressive laminotomy with bilateral L4 and L5 decompressive foraminotomies.            Action/Plan: Case manager spoke with patient concerning home health and DME needs at discharge. Choice was offered for home health agency, referral was called to [place], [facility] Specialist. DME has been ordered. Patient states her son/ daughter and a friend will assist her at discharge.   Expected Discharge Date:   01/04/16               Expected Discharge Plan:   Home with Home Health  In-House Referral:     Discharge planning Services  CM Consult  Post Acute Care Choice:  Durable Medical Equipment Choice offered to:  Patient  DME Arranged:  3-N-1, Walker rolling DME Agency:  [place]:  PT, OT [facility] Agency:  [place]  Status of Service:  Completed, signed off  Medicare Important Message Given:    Date Medicare IM Given:    Medicare IM give by:    Date Additional Medicare IM Given:    Additional Medicare Important Message give by:     If discussed at [place] of Stay Meetings, dates discussed:    Additional Comments:  [name], RN 01/03/2016, 10:21 AM

## 2016-01-03 NOTE — Progress Notes (Signed)
Postop day 1. Overall doing well. Back pain well controlled. Mobilizing with therapy. Patient having some difficulty voiding. No motor or sensory complaints otherwise.  Afebrile. Vitals are stable. Motor and sensory exam intact in both lower extremities. Wound clean and dry. Chest and abdomen benign.  Progressing reasonably well following lumbar decompression and fusion. Continue efforts at mobilization. Urinary retention likely secondary to pelvic muscle spasm in generally well clear with time

## 2016-01-04 LAB — GLUCOSE, CAPILLARY
Glucose-Capillary: 356 mg/dL — ABNORMAL HIGH (ref 65–99)
Glucose-Capillary: 339 mg/dL — ABNORMAL HIGH (ref 65–99)
Glucose-Capillary: 315 mg/dL — ABNORMAL HIGH (ref 65–99)

## 2016-01-04 MED ORDER — OXYCODONE HCL 20 MG PO TABS: 20.0000 mg | ORAL_TABLET | Freq: Four times a day (QID) | ORAL | Status: DC

## 2016-01-04 MED ORDER — DOXYCYCLINE HYCLATE 50 MG PO CAPS: 100.0000 mg | ORAL_CAPSULE | Freq: Two times a day (BID) | ORAL | Status: DC

## 2016-01-04 NOTE — Care Management (Signed)
Case manager received request to find community resources for patient concerning diabetes management. Case manager made [facility] referral, they will contact patient. Also contacted BC/BS to determine if they had a Comptroller, they do not.

## 2016-01-04 NOTE — Consult Note (Signed)
   [facility] CM Inpatient Consult   01/04/2016  [name] 1946-07-01 [id number]   Thank you for this consult.   This patient is not eligible for [facility] Care Management Services. Reason:  Not a beneficiary currently attributed to one of the [place].  Membership roster used to verify non- eligible status. Sent notification to inpatient RNCM to make aware.  Marthenia Rolling, MSN-Ed, RN,BSN [facility] Liaison [phone]

## 2016-01-04 NOTE — Progress Notes (Signed)
Patient alert and oriented, mae's well, voiding adequate amount of urine in a catheter, swallowing without difficulty, c/o mild pain. Patient discharged home with family. Script and discharged instructions given to patient. Patient and family stated understanding of instructions given. Patient has extra dressing home for home use. Patient was given foley catheter handout and care instructions for home.

## 2016-01-04 NOTE — Progress Notes (Signed)
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  Pt is now s/p Bil L4-5 decompressive laminotomy and Bil L4-5 decompressive foraminotomies.  Pt's PMH includes DM, osteoporosis, anxiety, depression, obesity, CHF, MI, Bil TKA.    PT Comments    Pt progressing towards physical therapy goals. Had long discussion with pt about home situation and pt reports that she is not sure who will be available for reliable assist at d/c. Discussed the possibility of SNF at d/c and pt was interested. She wanted to talk with her family before making any decisions. Do feel this pt would benefit from continued therapy at the SNF level, especially in light of the multiple falls pt has recently had at home. Will continue to follow.   Follow Up Recommendations  Supervision for mobility/OOB;SNF     Equipment Recommendations  3in1 (PT);Rolling walker with 5" wheels (Bariatric if able)    Recommendations for Other Services       Precautions / Restrictions Precautions Precautions: Fall;Back Precaution Booklet Issued: No Precaution Comments: Pt able to recall 2/3 precautions at start of session. Reviewed all precautions with pt. Required Braces or Orthoses: Spinal Brace Spinal Brace: Lumbar corset;Applied in sitting position Restrictions Weight Bearing Restrictions: No    Mobility  Bed Mobility Overal bed mobility: Needs Assistance Bed Mobility: Rolling;Sit to Sidelying Rolling: Supervision       Sit to sidelying: Min assist General bed mobility comments: Pt was able to transition to supine with assist for elevation of LE's back into bed.   Transfers Overall transfer level: Needs assistance Equipment used: Rolling walker (2 wheeled) Transfers: Sit  to/from Stand Sit to Stand: Min guard         General transfer comment: Close guard for safety. Pt was able to stand with slight flexion of the trunk - VC's to correct.   Ambulation/Gait Ambulation/Gait assistance: Min guard Ambulation Distance (Feet): 200 Feet Assistive device: Rolling walker (2 wheeled) Gait Pattern/deviations: Step-through pattern;Decreased stride length;Trunk flexed Gait velocity: Decreased Gait velocity interpretation: Below normal speed for age/gender General Gait Details: VC's for improved posture, increased DF on R, and increased heel strike bilaterally. Pt moving generally slow and guarded but no LOB noted. Hands-on guarding provided for safety.    Stairs            Wheelchair Mobility    Modified Rankin (Stroke Patients Only)       Balance Overall balance assessment: Needs assistance Sitting-balance support: Feet supported;No upper extremity supported Sitting balance-Leahy Scale: Good     Standing balance support: Bilateral upper extremity supported Standing balance-Leahy Scale: Fair                      Cognition Arousal/Alertness: Awake/alert Behavior During Therapy: Flat affect Overall Cognitive Status: Within Functional Limits for tasks assessed       Memory: Decreased recall of precautions;Decreased short-term memory              Exercises      General Comments General comments (skin integrity, edema, etc.): Pt reports 15 falls over the past 6 months from tripping. Discussed safety in home environment, wearing shoes, and focusing on heel strike/increased DF.       Pertinent Vitals/Pain Pain Assessment: Faces Faces Pain Scale: Hurts little  more Pain Location: Back Pain Descriptors / Indicators: Operative site guarding Pain Intervention(s): Limited activity within patient's tolerance;Monitored during session;Repositioned    Home Living                      Prior Function            PT Goals  (current goals can now be found in the care plan section) Acute Rehab PT Goals Patient Stated Goal: go home PT Goal Formulation: With patient Time For Goal Achievement: 01/12/16 Potential to Achieve Goals: Good Progress towards PT goals: Progressing toward goals    Frequency  Min 5X/week    PT Plan Discharge plan needs to be updated    Co-evaluation             End of Session Equipment Utilized During Treatment: Back brace;Gait belt Activity Tolerance: Patient tolerated treatment well Patient left: in chair;with call bell/phone within reach     Time: 0957-1028 PT Time Calculation (min) (ACUTE ONLY): 31 min  Charges:  $Gait Training: 23-37 mins                    G Codes:      Rolinda Roan 01-31-16, 1:14 PM  Rolinda Roan, PT, DPT Acute Rehabilitation Services Pager: [phone]

## 2016-01-04 NOTE — Discharge Instructions (Signed)

## 2016-01-04 NOTE — Progress Notes (Signed)
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  Pt is now s/p Bil L4-5 decompressive laminotomy and Bil L4-5 decompressive foraminotomies.  Pt's PMH includes DM, osteoporosis, anxiety, depression, obesity, CHF, MI, Bil TKA.   OT comments  Pt making gradual progress toward OT goals. Able to verbally recall 3/3 precautions at start of session but pt having difficulty maintaining throughout functional activities. Educated pt on walk in shower transfer; pt able to return demo with max verbal cues for safety and sequencing. Pt currently mod assist for donning back brace sitting EOB. D/c plan remains appropriate at this time. Will continue to follow acutely.    Follow Up Recommendations  Home health OT;Supervision/Assistance - 24 hour    Equipment Recommendations  3 in 1 bedside comode;Tub/shower seat    Recommendations for Other Services      Precautions / Restrictions Precautions Precautions: Fall;Back Precaution Comments: Pt able to recall 3/3 precautions at start of session. Reviewed all precautions with pt. Required Braces or Orthoses: Spinal Brace Spinal Brace: Lumbar corset;Applied in sitting position Restrictions Weight Bearing Restrictions: No       Mobility Bed Mobility Overal bed mobility: Needs Assistance Bed Mobility: Rolling;Sidelying to Sit Rolling: Min guard Sidelying to sit: Min guard       General bed mobility comments: VCs for safety and technique. Use of bed rails with HOB flat.  Transfers Overall transfer level: Needs assistance Equipment used: Rolling walker (2 wheeled) Transfers: Sit to/from Stand Sit to Stand: Min guard         General transfer comment: Close guard for safety but no physical  assist needed. VCs for hand placement and technique.    Balance Overall balance assessment: Needs assistance Sitting-balance support: Feet supported;No upper extremity supported Sitting balance-Leahy Scale: Good     Standing balance support: Bilateral upper extremity supported;During functional activity Standing balance-Leahy Scale: Fair Standing balance comment: RW for support                   ADL Overall ADL's : Needs assistance/impaired                 Upper Body Dressing : Moderate assistance;Sitting Upper Body Dressing Details (indicate cue type and reason): to don brace             Tub/ Shower Transfer: Min guard;Cueing for safety;Cueing for sequencing;Ambulation;Shower Technical sales engineer Details (indicate cue type and reason): Educated pt on walk in shower transfer technique. Pt able to return demo with max verbal cues for sequencing and safety. Pt with difficulty sequencing steps for shower transfer despite visual and verbal cues. Functional mobility during ADLs: Min guard;Rolling walker;Cueing for safety General ADL Comments: No family present for OT session. Pt continues to feel that her daughter can manage pts needs upon return home; does not feel like she needs SNF for futher rehab.       Vision                     Perception     Praxis      Cognition   Behavior During Therapy: Flat affect Overall Cognitive Status: Within Functional Limits for tasks assessed       Memory: Decreased recall of precautions;Decreased short-term memory  Extremity/Trunk Assessment               Exercises     Shoulder Instructions       General Comments      Pertinent Vitals/ Pain       Pain Assessment: Faces Faces Pain Scale: Hurts little more Pain Location: back Pain Descriptors / Indicators: Aching;Operative site guarding Pain Intervention(s): Limited activity within patient's tolerance;Monitored  during session;Repositioned  Home Living                                          Prior Functioning/Environment              Frequency Min 2X/week     Progress Toward Goals  OT Goals(current goals can now be found in the care plan section)  Progress towards OT goals: Progressing toward goals  Acute Rehab OT Goals Patient Stated Goal: go home OT Goal Formulation: With patient  Plan Discharge plan remains appropriate    Co-evaluation                 End of Session Equipment Utilized During Treatment: Gait belt;Rolling walker;Back brace   Activity Tolerance Patient limited by lethargy   Patient Left in chair;with call bell/phone within reach   Nurse Communication          Time: [id number] OT Time Calculation (min): 12 min  Charges: OT General Charges $OT Visit: 1 Procedure OT Treatments $Self Care/Home Management : 8-22 mins  Binnie Kand M.S., OTR/L Pager: [phone]  01/04/2016, 8:16 AM

## 2016-01-04 NOTE — Discharge Summary (Signed)
Physician Discharge Summary  Patient ID: [name] MRN: [id number] DOB/AGE: [birth year] 70 y.o.  Admit date: 01/02/2016 Discharge date: 01/04/2016  Admission Diagnoses:  Discharge Diagnoses:  Principal Problem:   Diabetes mellitus ([place]) Active Problems:   Hyperlipidemia   Anxiety and depression   GERD (gastroesophageal reflux disease)   Chronic pain syndrome   Degenerative spondylolisthesis   Tachycardia   Diabetes mellitus with complication [facility])   Essential hypertension   Discharged Condition: good  Hospital Course: Patient admitted the hospital where she underwent an uncomplicated [id number] decompression infusion. Postoperative she is done reasonably well with regard to her back. Her lower extremity pain is much better. She standing and walking without difficulty. She continues to have trouble with urinary retention which was present preoperatively and is in part due to a chronic UTI. The patient is being discharged home with indwelling catheter. She has intact sacral sensation. There is no question that this is not a problem secondary to cauda equina dysfunction.   onsults:  Significant Diagnostic Studies:   Treatments:   Discharge Exam: Blood pressure 134/60, pulse 114, temperature 98.2 F (36.8 C), temperature source Oral, resp. rate 18, weight 124.739 kg (275 lb), SpO2 92 %. Awake and alert. Oriented and appropriate. Cranial nerve function intact. Motor and sensory function extremities normal. Wound clean and dry. Chest and abdomen benign.  Disposition:      Medication List    TAKE these medications        busPIRone 30 MG tablet  Commonly known as:  BUSPAR  Take 30 mg by mouth 2 (two) times daily.     clonazePAM 1 MG tablet  Commonly known as:  KLONOPIN  Take 1 mg by mouth 3 (three) times daily.     cyclobenzaprine 10 MG tablet  Commonly known as:  FLEXERIL  Take 10 mg by mouth 3 (three) times daily.     doxepin 25 MG capsule  Commonly known as:  SINEQUAN   Take 25 mg by mouth at bedtime.     doxycycline 50 MG capsule  Commonly known as:  VIBRAMYCIN  Take 2 capsules (100 mg total) by mouth 2 (two) times daily.     furosemide 40 MG tablet  Commonly known as:  LASIX  Take 40 mg by mouth 2 (two) times daily.     insulin lispro 100 UNIT/ML injection  Commonly known as:  HUMALOG  Inject 60 Units into the skin daily.     metoprolol succinate 25 MG 24 hr tablet  Commonly known as:  TOPROL-XL  Take 25 mg by mouth daily.     nystatin powder  Commonly known as:  MYCOSTATIN  Apply 1 Bottle topically 2 (two) times daily. Breast/stomach folds     Oxycodone HCl 20 MG Tabs  Take 1 tablet (20 mg total) by mouth 4 (four) times daily.     pioglitazone 45 MG tablet  Commonly known as:  ACTOS  Take 45 mg by mouth daily.     tolterodine 4 MG 24 hr capsule  Commonly known as:  DETROL LA  Take 1 capsule (4 mg total) by mouth daily. Start with 1/2 tablet daily x 1 week, then increase to whole tablet if needed.           Follow-up Information    Follow up with [facility].   Why:  someone from [facility] will contact you concerning arranging start date and time for therapy.   Contact information:   [place]  [place] [address] [phone]       Follow up with Charlie Pitter, MD.   Specialty:  Neurosurgery   Contact information:   [address] [place] [address] [phone]       Signed: Charlie Pitter 01/04/2016, 11:14 AM

## 2016-04-18 ENCOUNTER — Ambulatory Visit (INDEPENDENT_AMBULATORY_CARE_PROVIDER_SITE_OTHER): Payer: Medicare Other | Admitting: Podiatry

## 2016-04-18 ENCOUNTER — Ambulatory Visit (INDEPENDENT_AMBULATORY_CARE_PROVIDER_SITE_OTHER): Payer: Medicare Other

## 2016-04-18 ENCOUNTER — Encounter: Payer: Self-pay | Admitting: Podiatry

## 2016-04-18 DIAGNOSIS — R52 Pain, unspecified: Secondary | ICD-10-CM | POA: Diagnosis not present

## 2016-04-18 DIAGNOSIS — L84 Corns and callosities: Secondary | ICD-10-CM

## 2016-04-18 DIAGNOSIS — M722 Plantar fascial fibromatosis: Secondary | ICD-10-CM

## 2016-04-18 NOTE — Patient Instructions (Signed)

## 2016-04-21 NOTE — Progress Notes (Signed)
Patient ID: [name], female   DOB: [birth year], 70 y.o.   MRN: [id number]  Subjective: 70 year old female presents the office today for concerns of left heel pain which has been ongoing the last couple months and his been worsening. She states that she has pain in the mornings when she first gets up or after periods of rest when she puts weight to her heel she gets pain. She's had no recent treatment for this. She did undergo back surgery February of this year. She states that around that time or after the back surgery she started to develop heel pain. She denies any numbness or tingling to her heel describes as a throbbing pain. Denies any systemic complaints such as fevers, chills, nausea, vomiting. No acute changes since last appointment, and no other complaints at this time.   Objective: AAO x3, NAD DP/PT pulses palpable bilaterally, CRT less than 3 seconds Tenderness to palpation along the plantar medial tubercle of the calcaneus at the insertion of plantar fascia on the left foot. There is no pain along the course of the plantar fascia within the arch of the foot. Plantar fascia appears to be intact. There is no pain with lateral compression of the calcaneus or pain with vibratory sensation. There is no pain along the course or insertion of the achilles tendon. No other areas of tenderness to bilateral lower extremities. The distal aspect of the left hallux are to annular small punctate hyperkeratotic lesions/. No swelling edema, erythema. There are no other open lesions pre-ulcerative lesions. There is no clinical signs of infection. No pain with calf compression, swelling, warmth, erythema  Assessment: Left heel pain, likely plantar fasciitis, left hallux lesions  Plan: -All treatment options discussed with the patient including all alternatives, risks, complications.  -Currently no signs of infection the left hallux lesions. Offloading. -X-rays were obtained and reviewed with the  patient. No evidence of acute fracture. -Patient elects to proceed with steroid injection into the left heel. Under sterile skin preparation, a total of 2.5cc of kenalog 10, 0.5% Marcaine plain, and 2% lidocaine plain were infiltrated into the symptomatic area without complication. A band-aid was applied. Patient tolerated the injection well without complication. Post-injection care with discussed with the patient. Discussed with the patient to ice the area over the next couple of days to help prevent a steroid flare. Monitor blood sugar. Continue stretching, icing. Plantar fascial brace dispensed. -Follow-up in 3 weeks or sooner if needed. -Patient encouraged to call the office with any questions, concerns, change in symptoms.   Celesta Gentile, DPM

## 2016-05-02 ENCOUNTER — Other Ambulatory Visit (HOSPITAL_COMMUNITY): Payer: Self-pay | Admitting: Neurosurgery

## 2016-05-02 DIAGNOSIS — M4310 Spondylolisthesis, site unspecified: Secondary | ICD-10-CM

## 2016-05-14 ENCOUNTER — Ambulatory Visit: Payer: Medicare Other | Admitting: Podiatry

## 2016-05-23 ENCOUNTER — Ambulatory Visit
Admission: RE | Admit: 2016-05-23 | Discharge: 2016-05-23 | Disposition: A | Discharge status: Home / Self Care | Payer: Medicare Other | Source: Ambulatory Visit | Attending: Neurosurgery | Admitting: Neurosurgery

## 2016-05-23 ENCOUNTER — Ambulatory Visit: Payer: Medicare Other | Admitting: Podiatry

## 2016-05-23 DIAGNOSIS — M4806 Spinal stenosis, lumbar region: Secondary | ICD-10-CM | POA: Diagnosis not present

## 2016-05-23 DIAGNOSIS — M4856XA Collapsed vertebra, not elsewhere classified, lumbar region, initial encounter for fracture: Secondary | ICD-10-CM | POA: Insufficient documentation

## 2016-05-23 DIAGNOSIS — M5136 Other intervertebral disc degeneration, lumbar region: Secondary | ICD-10-CM | POA: Insufficient documentation

## 2016-05-23 DIAGNOSIS — Z9889 Other specified postprocedural states: Secondary | ICD-10-CM | POA: Diagnosis not present

## 2016-05-23 DIAGNOSIS — M4310 Spondylolisthesis, site unspecified: Secondary | ICD-10-CM | POA: Diagnosis present

## 2016-05-23 LAB — POCT I-STAT CREATININE: Creatinine, Ser: 0.7 mg/dL (ref 0.44–1.00)

## 2016-05-23 MED ORDER — GADOBENATE DIMEGLUMINE 529 MG/ML IV SOLN
20.0000 mL | Freq: Once | INTRAVENOUS | Status: AC | PRN
  Administered 2016-05-23: 20 mL via INTRAVENOUS

## 2016-05-28 ENCOUNTER — Encounter: Payer: Self-pay | Admitting: Podiatry

## 2016-05-28 ENCOUNTER — Ambulatory Visit (INDEPENDENT_AMBULATORY_CARE_PROVIDER_SITE_OTHER): Payer: Medicare Other | Admitting: Podiatry

## 2016-05-28 DIAGNOSIS — L84 Corns and callosities: Secondary | ICD-10-CM

## 2016-05-28 DIAGNOSIS — M722 Plantar fascial fibromatosis: Secondary | ICD-10-CM | POA: Insufficient documentation

## 2016-05-28 NOTE — Progress Notes (Signed)
Subjective: 70 year old female presents the office they for follow-up evaluation of left heel pain. She said the injection helped for several days before the pain surgery occur. Overall the pain is improved her last appointment although she does have continued pain to the bottom of her left heel. Denies in swelling or redness. The pain does not wake her up at night. She also states that the lesions that she had a left big toe have resolved. Denies any swelling or redness or drainage.Denies any systemic complaints such as fevers, chills, nausea, vomiting. No acute changes since last appointment, and no other complaints at this time. She states that she has chronic pain, fibromyalgia, spinal stenosis and she is curling been seen by pain management.   Patient is upset at today's appointment as her grandson was killed last night in a car accident.  Objective: AAO x3, NAD DP/PT pulses palpable bilaterally, CRT less than 3 seconds There is decreased but continued tenderness to palpation along the plantar medial tubercle of the calcaneus at the insertion of plantar fascia on the left foot. There is no pain along the course of the plantar fascia within the arch of the foot. Plantar fascia appears to be intact. There is no pain with lateral compression of the calcaneus or pain with vibratory sensation. There is no pain along the course or insertion of the achilles tendon. No other areas of tenderness to bilateral lower extremities. No areas of pinpoint bony tenderness or pain with vibratory sensation. MMT 5/5, ROM WNL. No edema, erythema, increase in warmth to bilateral lower extremities.  Small hyperkeratotic lesions left plantar hallux 2 as well as left submetatarsal one. Upon debridement no underlying ulceration, drainage or other signs of infection.  No open lesions or pre-ulcerative lesions.  No pain with calf compression, swelling, warmth, erythema  Assessment:  left heel pain, plantar fasciitis;  pre-ulcerative lesions   Plan: -All treatment options discussed with the patient including all alternatives, risks, complications.  -Patient elects to proceed with steroid injection into the left heel. Under sterile skin preparation, a total of 2.5cc of kenalog 10, 0.5% Marcaine plain, and 2% lidocaine plain were infiltrated into the symptomatic area without complication. A band-aid was applied. Patient tolerated the injection well without complication. Post-injection care with discussed with the patient. Discussed with the patient to ice the area over the next couple of days to help prevent a steroid flare. Monitor blood sugar. Continue plantar fascial brace. Stretching exercises daily. Discussed supportive shoe gear.  -Pre-ulcerative callus was debrided 3 without couple complications or bleeding.  -Follow-up 4 weeks or sooner if needed.  -Patient encouraged to call the office with any questions, concerns, change in symptoms.   Celesta Gentile, DPM

## 2016-06-25 ENCOUNTER — Encounter: Payer: Self-pay | Admitting: Podiatry

## 2016-06-25 ENCOUNTER — Telehealth: Payer: Self-pay | Admitting: *Deleted

## 2016-06-25 ENCOUNTER — Ambulatory Visit (INDEPENDENT_AMBULATORY_CARE_PROVIDER_SITE_OTHER): Payer: Medicare Other | Admitting: Podiatry

## 2016-06-25 DIAGNOSIS — L84 Corns and callosities: Secondary | ICD-10-CM

## 2016-06-25 DIAGNOSIS — M722 Plantar fascial fibromatosis: Secondary | ICD-10-CM | POA: Diagnosis not present

## 2016-06-25 DIAGNOSIS — R0989 Other specified symptoms and signs involving the circulatory and respiratory systems: Secondary | ICD-10-CM

## 2016-06-25 NOTE — Telephone Encounter (Signed)
Dr. Jacqualyn Posey ordered arterial dopplers. Faxed orders to [facility].

## 2016-06-28 ENCOUNTER — Ambulatory Visit
Admission: RE | Admit: 2016-06-28 | Discharge: 2016-06-28 | Disposition: A | Discharge status: Home / Self Care | Payer: Medicare Other | Source: Ambulatory Visit | Attending: Orthopedic Surgery | Admitting: Orthopedic Surgery

## 2016-06-28 ENCOUNTER — Other Ambulatory Visit: Payer: Self-pay | Admitting: Orthopedic Surgery

## 2016-06-28 DIAGNOSIS — M25561 Pain in right knee: Secondary | ICD-10-CM

## 2016-06-28 DIAGNOSIS — M25552 Pain in left hip: Secondary | ICD-10-CM

## 2016-06-28 DIAGNOSIS — M25551 Pain in right hip: Secondary | ICD-10-CM | POA: Diagnosis not present

## 2016-06-28 DIAGNOSIS — M25559 Pain in unspecified hip: Secondary | ICD-10-CM | POA: Diagnosis present

## 2016-06-28 DIAGNOSIS — M25562 Pain in left knee: Secondary | ICD-10-CM | POA: Diagnosis not present

## 2016-06-28 DIAGNOSIS — M25569 Pain in unspecified knee: Secondary | ICD-10-CM | POA: Diagnosis present

## 2016-06-30 NOTE — Progress Notes (Signed)
Subjective: 70 year old female presents the office they for follow-up evaluation of left heel pain. She states that she is continued pain to the heel and she is requesting a steroid injection today. She states the lesions to the left toe due to bleed occasionally denies any redness or drainage or any signs of infection. She also discuss surgical intervention for the heel pain. She denies any systemic complaints such as fevers, chills, nausea, vomiting. No calf pain, chest pain, shortness of breath.   Objective: AAO x3, NAD DP/PT pulses palpable bilaterally, CRT less than 3 seconds There is continued tenderness to palpation along the plantar medial tubercle of the calcaneus at the insertion of plantar fascia on the left foot. Subjectively the pain has improved but does continue. There is no pain along the course of the plantar fascia within the arch of the foot. Plantar fascia appears to be intact. There is no pain with lateral compression of the calcaneus or pain with vibratory sensation. There is no pain along the course or insertion of the achilles tendon. No other areas of tenderness to bilateral lower extremities. No areas of pinpoint bony tenderness or pain with vibratory sensation. MMT 5/5, ROM WNL. No edema, erythema, increase in warmth to bilateral lower extremities.  Small hyperkeratotic lesions left plantar hallux 2. Upon debridement there is no underlying ulceration, drainage or other signs of infection. There is no edema, erythema to the area. No malodor. No fluctuance or crepitus No open lesions or pre-ulcerative lesions.  No pain with calf compression, swelling, warmth, erythema  Assessment: Left heel pain, plantar fasciitis; pre-ulcerative lesions   Plan: -All treatment options discussed with the patient including all alternatives, risks, complications.  -Patient elects to proceed with steroid injection into the left heel. Under sterile skin preparation, a total of 2.5cc of kenalog  10, 0.5% Marcaine plain, and 2% lidocaine plain were infiltrated into the symptomatic area without complication. A band-aid was applied. Patient tolerated the injection well without complication. Post-injection care with discussed with the patient. Discussed with the patient to ice the area over the next couple of days to help prevent a steroid flare. Monitor blood sugar. Continue plantar fascial brace. Stretching exercises daily. Discussed supportive shoe gear. I discussed with her surgical intervention. Given her other medical conditions I believe that surgery is not any completely eliminate her heel pain. She wishes to hold off on surgery for now. -Offloading pads dispensed the lesions the left hallux. Monitor for signs or symptoms of infection. -Office scheduled or sooner if needed. Call any questions or concerns in the meantime.  Celesta Gentile, DPM

## 2016-07-18 ENCOUNTER — Ambulatory Visit (INDEPENDENT_AMBULATORY_CARE_PROVIDER_SITE_OTHER): Payer: Medicare Other | Admitting: Podiatry

## 2016-07-18 ENCOUNTER — Ambulatory Visit (INDEPENDENT_AMBULATORY_CARE_PROVIDER_SITE_OTHER): Payer: Medicare Other

## 2016-07-18 ENCOUNTER — Encounter: Payer: Self-pay | Admitting: Podiatry

## 2016-07-18 DIAGNOSIS — S92302A Fracture of unspecified metatarsal bone(s), left foot, initial encounter for closed fracture: Secondary | ICD-10-CM

## 2016-07-18 DIAGNOSIS — S82402A Unspecified fracture of shaft of left fibula, initial encounter for closed fracture: Secondary | ICD-10-CM

## 2016-07-18 DIAGNOSIS — R52 Pain, unspecified: Secondary | ICD-10-CM

## 2016-07-22 DIAGNOSIS — S92309A Fracture of unspecified metatarsal bone(s), unspecified foot, initial encounter for closed fracture: Secondary | ICD-10-CM | POA: Insufficient documentation

## 2016-07-22 NOTE — Progress Notes (Signed)
Subjective: 70 year old female presents the occipital concerns of left foot pain which is been ongoing for the last couple of days. She denies any recent injury or trauma at first. Later on in the appointment she did recall that she did fall out of bed injuring her foot and she fell again she had a call EMS to get her up. She said that she is having pain left foot she's having difficulty putting weight on the foot. She states it has been somewhat swollen as well as turning red at times. Denies any systemic complaints such as fevers, chills, nausea, vomiting. No acute changes since last appointment, and no other complaints at this time.   Objective: AAO x3, NAD DP/PT pulses palpable bilaterally, CRT less than 3 seconds There is tenderness the fifth metatarsal as well as lateral ankle. Ankle, subtalar joint range of motion is intact. There is no other areas of pinpoint bony tenderness or pain the vibratory sensation. The areas of previous ulceration the distal aspect left hallux have improved there is no open sore identified this time there is no redness or drainage or any swelling and there is no signs of infection to these areas. There is localized edema to the lateral aspect left foot as well as the left ankle. No other open lesions or pre-ulcerative lesions. There is no pain with calf compression, swelling, warmth, erythema.   Assessment : Left fifth metatarsal fracture, possible fibular fracture  Plan:  -Treatment options discussed including all alternatives, risks, and complications -X-rays were obtained and reviewed with the patient. There is a fracture fifth metatarsal as well as a possible fracture to the distal fibula.  -At this time she is placed into a cam boot for mobilization. Continue elevation. Compression wrap was applied she can continue this at home as well.  -Follow-up as scheduled or sooner if needed.  -Patient encouraged to call the office with any questions, concerns, change in  symptoms.   Celesta Gentile, DPM

## 2016-07-26 ENCOUNTER — Ambulatory Visit (INDEPENDENT_AMBULATORY_CARE_PROVIDER_SITE_OTHER): Payer: Medicare Other | Admitting: Urology

## 2016-07-26 ENCOUNTER — Encounter: Payer: Self-pay | Admitting: Urology

## 2016-07-26 VITALS — BP 120/79 | HR 94 | Ht 65.0 in | Wt 282.6 lb

## 2016-07-26 DIAGNOSIS — R3129 Other microscopic hematuria: Secondary | ICD-10-CM

## 2016-07-26 DIAGNOSIS — N390 Urinary tract infection, site not specified: Secondary | ICD-10-CM

## 2016-07-26 LAB — URINALYSIS, COMPLETE
Bilirubin, UA: NEGATIVE
Nitrite, UA: POSITIVE — AB
Specific Gravity, UA: 1.025 (ref 1.005–1.030)
Urobilinogen, Ur: 1 mg/dL (ref 0.2–1.0)
pH, UA: 5.5 (ref 5.0–7.5)

## 2016-07-26 LAB — BLADDER SCAN AMB NON-IMAGING: Scan Result: 151

## 2016-07-26 LAB — MICROSCOPIC EXAMINATION
Epithelial Cells (non renal): >10 /hpf — AB (ref 0–10)
WBC, UA: >30 /hpf — AB (ref 0–?)

## 2016-07-26 NOTE — Progress Notes (Signed)
07/26/2016 11:54 AM   [name] 04-29-1946 [id number]  Referring provider: Lorelee Market, MD [place], [place] [address]  Chief Complaint  Patient presents with  . New Patient (Initial Visit)    recurrent UTI    HPI: The patient is a 73-ye-old female presents for evaluation of recurrent urinary tract infection.  She has had 6 symptomatic urinary tract infections in the last 6 months. She typically grows Escherichia coli. When she has symptoms she has dysuria, urgency, urge incontinence, and suprapubic pain. She does not develop fevers. She does not have these symptoms at baseline. She hass no history of uterine, breast, ovarian cancer. She also started having microscopic hematuria today. She is asymptomatic at this time.   PMH: Past Medical History:  Diagnosis Date  . Amnesia   . Anxiety   . Arthritis   . Back pain   . CHF (congestive heart failure) ([place])   . Chronic pain syndrome   . Cystocele   . Depression   . Diabetes mellitus without complication ([place])   . GERD (gastroesophageal reflux disease)   . Hyperlipidemia   . IBS (irritable bowel syndrome)   . Myocardial infarction ([place])   . Obesity   . Osteoporosis   . Osteoporosis   . Shortness of breath dyspnea   . Sleep apnea    not using now  . Thyroid nodule     Surgical History: Past Surgical History:  Procedure Laterality Date  . ABDOMINAL HYSTERECTOMY    . APPENDECTOMY    . CARDIAC CATHETERIZATION    . CHOLECYSTECTOMY    . FOOT SURGERY    . OOPHORECTOMY    . REPLACEMENT TOTAL KNEE    . TONSILLECTOMY      Home Medications:    Medication List       Accurate as of 07/26/16 11:54 AM. Always use your most recent med list.          busPIRone 30 MG tablet Commonly known as:  BUSPAR Take 30 mg by mouth 2 (two) times daily.   cefaclor 500 MG capsule Commonly known as:  CECLOR   cefdinir 300 MG capsule Commonly known as:  OMNICEF   clonazePAM 1 MG tablet Commonly known as:   KLONOPIN Take 1 mg by mouth 3 (three) times daily.   cyclobenzaprine 10 MG tablet Commonly known as:  FLEXERIL Take 10 mg by mouth 3 (three) times daily.   doxepin 25 MG capsule Commonly known as:  SINEQUAN Take 25 mg by mouth at bedtime.   doxycycline 50 MG capsule Commonly known as:  VIBRAMYCIN Take 2 capsules (100 mg total) by mouth 2 (two) times daily.   fluticasone 50 MCG/ACT nasal spray Commonly known as:  FLONASE   furosemide 40 MG tablet Commonly known as:  LASIX Take 40 mg by mouth 2 (two) times daily.   insulin lispro 100 UNIT/ML injection Commonly known as:  HUMALOG Inject 60 Units into the skin daily.   metFORMIN 1000 MG tablet Commonly known as:  GLUCOPHAGE   metoprolol succinate 25 MG 24 hr tablet Commonly known as:  TOPROL-XL TAKE 2 TABLETS (50 MG TOTAL) BY MOUTH ONCE DAILY.   nystatin 100000 UNIT/ML suspension Commonly known as:  MYCOSTATIN   nystatin powder Commonly known as:  MYCOSTATIN/NYSTOP Apply 1 Bottle topically 2 (two) times daily. Breast/stomach folds   oxybutynin 5 MG 24 hr tablet Commonly known as:  DITROPAN-XL   Oxycodone HCl 20 MG Tabs Take 1 tablet (20 mg total) by  mouth 4 (four) times daily.   pioglitazone 45 MG tablet Commonly known as:  ACTOS Take 45 mg by mouth daily.   simvastatin 40 MG tablet Commonly known as:  ZOCOR   SUBOXONE 4-1 MG Film Generic drug:  Buprenorphine HCl-Naloxone HCl   tolterodine 4 MG 24 hr capsule Commonly known as:  DETROL LA Take 1 capsule (4 mg total) by mouth daily. Start with 1/2 tablet daily x 1 week, then increase to whole tablet if needed.       Allergies: No Known Allergies  Family History: Family History  Problem Relation Age of Onset  . Cancer Mother   . Cancer Father   . Cancer Sister   . Thyroid disease Neg Hx     Social History:  reports that she has never smoked. Her smokeless tobacco use includes Chew. She reports that she does not drink alcohol. Her drug history is not  on file.  ROS: UROLOGY Frequent Urination?: No Hard to postpone urination?: No Burning/pain with urination?: Yes Get up at night to urinate?: Yes Leakage of urine?: Yes Urine stream starts and stops?: Yes Trouble starting stream?: No Do you have to strain to urinate?: No Blood in urine?: No Urinary tract infection?: Yes Sexually transmitted disease?: No Injury to kidneys or bladder?: No Painful intercourse?: No Weak stream?: Yes Currently pregnant?: No Vaginal bleeding?: No Last menstrual period?: No  Gastrointestinal Nausea?: No Vomiting?: No Indigestion/heartburn?: Yes Diarrhea?: No Constipation?: No  Constitutional Fever: No Night sweats?: No Weight loss?: No Fatigue?: Yes  Skin Skin rash/lesions?: No Itching?: No  Eyes Blurred vision?: No Double vision?: No  Ears/Nose/Throat Sore throat?: No Sinus problems?: Yes  Hematologic/Lymphatic Swollen glands?: No Easy bruising?: Yes  Cardiovascular Leg swelling?: No Chest pain?: No  Respiratory Cough?: No Shortness of breath?: Yes  Endocrine Excessive thirst?: Yes  Musculoskeletal Back pain?: Yes Joint pain?: Yes  Neurological Headaches?: No Dizziness?: No  Psychologic Depression?: Yes Anxiety?: Yes  Physical Exam: BP 120/79   Pulse 94   Ht 5\' 5"  (1.651 m)   Wt 282 lb 9.6 oz (128.2 kg)   BMI 47.03 kg/m   Constitutional:  Alert and oriented, No acute distress. HEENT: [place] AT, moist mucus membranes.  Trachea midline, no masses. Cardiovascular: No clubbing, cyanosis, or edema. Respiratory: Normal respiratory effort, no increased work of breathing. GI: Abdomen is soft, nontender, nondistended, no abdominal masses GU: No CVA tenderness.  Skin: No rashes, bruises or suspicious lesions. Lymph: No cervical or inguinal adenopathy. Neurologic: Grossly intact, no focal deficits, moving all 4 extremities. Psychiatric: Normal mood and affect.  Laboratory Data: Lab Results  Component Value Date    WBC 6.9 12/22/2015   HGB 14.6 12/22/2015   HCT 43.4 12/22/2015   MCV 88.4 12/22/2015   PLT 149 (L) 12/22/2015    Lab Results  Component Value Date   CREATININE 0.70 05/23/2016    No results found for: PSA  No results found for: TESTOSTERONE  Lab Results  Component Value Date   HGBA1C 11.9 (H) 12/22/2015    Urinalysis    Component Value Date/Time   COLORURINE Yellow 01/30/2014 0026   COLORURINE RED BIOCHEMICALS MAY BE AFFECTED BY COLOR (A) 07/11/2008 1245   APPEARANCEUR Cloudy 01/30/2014 0026   LABSPEC 1.024 01/30/2014 0026   PHURINE 6.0 01/30/2014 0026   PHURINE 5.0 07/11/2008 1245   GLUCOSEU >=500 01/30/2014 0026   HGBUR 3+ 01/30/2014 0026   HGBUR LARGE (A) 07/11/2008 1245   BILIRUBINUR Negative 01/30/2014 0026   Benjamin Stain  Negative 01/30/2014 0026   KETONESUR 40 (A) 07/11/2008 1245   PROTEINUR Negative 01/30/2014 0026   PROTEINUR 100 (A) 07/11/2008 1245   UROBILINOGEN 1.0 07/11/2008 1245   NITRITE Negative 01/30/2014 0026   NITRITE POSITIVE (A) 07/11/2008 1245   LEUKOCYTESUR Trace 01/30/2014 0026      Assessment & Plan:    1. Recurrent UTI 2. Microscopic hematuria The patient has both recurrent urinary tract infection and asymptomatic microscopic hematuria at this time. We'll complete a formal evaluation for both of these issues. We will first perform a CT urogram followed by office cystoscopy and a pelvic exam. If these are normal, the patient likely benefit from vaginal estrogen therapy.  Return for after CT for cysto/pelvic exam.  Nickie Retort, MD  [facility] [address] [place], [place] [address] [phone]

## 2016-07-26 NOTE — Addendum Note (Signed)
Addended by: Wilson Singer on: 07/26/2016 12:16 PM   Modules accepted: Orders

## 2016-07-29 ENCOUNTER — Telehealth: Payer: Self-pay | Admitting: *Deleted

## 2016-07-29 DIAGNOSIS — R52 Pain, unspecified: Secondary | ICD-10-CM

## 2016-07-29 NOTE — Telephone Encounter (Signed)
Completed required Dr. Primus Bravo form, referral, clinical and pt demographics faxed.

## 2016-08-07 ENCOUNTER — Other Ambulatory Visit: Payer: Self-pay | Admitting: Podiatry

## 2016-08-07 ENCOUNTER — Encounter (INDEPENDENT_AMBULATORY_CARE_PROVIDER_SITE_OTHER): Payer: Medicare Other

## 2016-08-07 DIAGNOSIS — R0989 Other specified symptoms and signs involving the circulatory and respiratory systems: Secondary | ICD-10-CM

## 2016-08-08 ENCOUNTER — Ambulatory Visit: Payer: Medicare Other

## 2016-08-08 ENCOUNTER — Ambulatory Visit (INDEPENDENT_AMBULATORY_CARE_PROVIDER_SITE_OTHER): Payer: Medicare Other

## 2016-08-08 ENCOUNTER — Encounter: Payer: Self-pay | Admitting: Podiatry

## 2016-08-08 ENCOUNTER — Ambulatory Visit (INDEPENDENT_AMBULATORY_CARE_PROVIDER_SITE_OTHER): Payer: Medicare Other | Admitting: Podiatry

## 2016-08-08 DIAGNOSIS — R52 Pain, unspecified: Secondary | ICD-10-CM | POA: Diagnosis not present

## 2016-08-08 DIAGNOSIS — S92355D Nondisplaced fracture of fifth metatarsal bone, left foot, subsequent encounter for fracture with routine healing: Secondary | ICD-10-CM

## 2016-08-08 NOTE — Progress Notes (Signed)
Subjective: 70 year old female presents the office they for follow-up evaluation of left foot pain. She's been wearing the cam boot intermittently but she presents today wearing a flip-flop. She still gets some pain is wanting to left foot she points the fifth metatarsal area where she gets majority of pain. She also states that she has fallen out of bed 3 times with [place] sleeping in the middle of bed this no longer occurs. She denies taking any new medication or any excess mediation. Denies any systemic complaints such as fevers, chills, nausea, vomiting. No acute changes since last appointment, and no other complaints at this time.   Objective: AAO x3, NAD DP/PT pulses palpable bilaterally, CRT less than 3 seconds There is continuation but improved tenderness to palpation of the fifth metatarsal head on the lateral aspect the left foot there is localized edema to this area without any erythema or increase in warmth. There is no open lesions identified at this time. Small skin fissures present the plantar aspect left heel without any drainage or pus any swelling or redness. No Signs of Infection.  No edema, erythema, increase in warmth to bilateral lower extremities.  No open lesions or pre-ulcerative lesions.  No pain with calf compression, swelling, warmth, erythema  Assessment: Left fifth metatarsal fracture  Plan: -All treatment options discussed with the patient including all alternatives, risks, complications.  -X-rays were obtained and reviewed with the patient. There is a lucent line still evident and the head of the fifth metatarsal however does appear to be healing. -She has not been wearing the cam boot [facility] [place] recommended surgical shoe was dispensed today. -Elevation -Limit weightbearing -She is currently in the process of getting a new pain management physician. Recommended follow-up with her primary care physician as well. -Follow-up as scheduled or sooner if needed -Patient  encouraged to call the office with any questions, concerns, change in symptoms.   Celesta Gentile, DPM

## 2016-08-12 ENCOUNTER — Ambulatory Visit: Admission: RE | Admit: 2016-08-12 | Payer: Medicare Other | Source: Ambulatory Visit

## 2016-08-13 ENCOUNTER — Ambulatory Visit (INDEPENDENT_AMBULATORY_CARE_PROVIDER_SITE_OTHER): Payer: Medicare Other | Admitting: Podiatry

## 2016-08-13 ENCOUNTER — Ambulatory Visit (INDEPENDENT_AMBULATORY_CARE_PROVIDER_SITE_OTHER): Payer: Medicare Other

## 2016-08-13 ENCOUNTER — Encounter: Payer: Self-pay | Admitting: Podiatry

## 2016-08-13 VITALS — BP 140/84 | HR 95 | Resp 16

## 2016-08-13 DIAGNOSIS — E1143 Type 2 diabetes mellitus with diabetic autonomic (poly)neuropathy: Secondary | ICD-10-CM

## 2016-08-13 DIAGNOSIS — I70235 Atherosclerosis of native arteries of right leg with ulceration of other part of foot: Secondary | ICD-10-CM | POA: Diagnosis not present

## 2016-08-13 DIAGNOSIS — M79671 Pain in right foot: Secondary | ICD-10-CM

## 2016-08-13 DIAGNOSIS — E11621 Type 2 diabetes mellitus with foot ulcer: Secondary | ICD-10-CM | POA: Diagnosis not present

## 2016-08-13 DIAGNOSIS — L97512 Non-pressure chronic ulcer of other part of right foot with fat layer exposed: Secondary | ICD-10-CM

## 2016-08-13 MED ORDER — SULFAMETHOXAZOLE-TRIMETHOPRIM 800-160 MG PO TABS: 1.0000 | ORAL_TABLET | Freq: Two times a day (BID) | ORAL | 0 refills | Status: DC

## 2016-08-14 NOTE — Progress Notes (Signed)
Subjective:  70 year old female diabetic patient presents to the office today for new chief complaint of redness and bruising to the right foot and ankle. Patient states that she noticed a wound to the right first MPJ over the past few weeks at which time she saw redness on her foot after 08/08/2016. Patient denies trauma. Patient admits to walking barefoot at her house. Patient sugars not checked today. Hemoglobin A1c 11.9 on 12/22/2015.    Objective/Physical Exam General: The patient is alert and oriented x3 in no acute distress.  Dermatology: Wound noted to the first MPJ of the right foot medially measuring 2.0 cm in length by 2.0 cm in width by 0.1 cm in depth.  To the above-noted ulceration there is no eschar there is a moderate amount of slough fibrin and necrotic tissue. There is no exposed bone muscle-tendon ligament or joint. Granulation tissue and wound base is red. There is a moderate amount of serosanguineous drainage noted. There is mild malodor. Periwound integrity is erythematous Skin is warm, dry and supple bilateral lower extremities.   Vascular: Palpable pedal pulses bilaterally. Edema noted to the right foot Capillary refill within normal limits.  Neurological: Epicritic and protective threshold diminished bilaterally.   Musculoskeletal Exam: Range of motion within normal limits to all pedal and ankle joints bilateral. Muscle strength 5/5 in all groups bilateral.   Radiographic Exam:  Normal osseous mineralization. Joint spaces preserved. No fracture/dislocation/boney destruction.    Assessment: #1 ulcer right foot secondary to diabetes mellitus. #2 pain in right foot   Plan of Care:  #1 Patient was evaluated. #2 likely necessary excisional debridement including subcutaneous tissues performed using a tissue nipper. Excisional debridement of all necrotic nonviable tissue down to healthy bleeding viable tissue was performed with post-debridement measurements and was  pre-. #3 the wound was cleansed with normal saline and dry sterile dressing applied. #4 cultures were taken today. #5 prescription for Bactrim 2 weeks was dispensed #6 patient was instructed to go to the ER if condition worsens. #7 patient is to return to clinic in 1 week   Dr. Edrick Kins, [place]

## 2016-08-15 ENCOUNTER — Encounter: Payer: Self-pay | Admitting: Urology

## 2016-08-15 ENCOUNTER — Other Ambulatory Visit: Payer: Medicare Other | Admitting: Urology

## 2016-08-20 ENCOUNTER — Ambulatory Visit (INDEPENDENT_AMBULATORY_CARE_PROVIDER_SITE_OTHER): Payer: Medicare Other | Admitting: Podiatry

## 2016-08-20 DIAGNOSIS — I70235 Atherosclerosis of native arteries of right leg with ulceration of other part of foot: Secondary | ICD-10-CM | POA: Diagnosis not present

## 2016-08-20 DIAGNOSIS — E11621 Type 2 diabetes mellitus with foot ulcer: Secondary | ICD-10-CM

## 2016-08-20 DIAGNOSIS — E1143 Type 2 diabetes mellitus with diabetic autonomic (poly)neuropathy: Secondary | ICD-10-CM

## 2016-08-20 DIAGNOSIS — L97512 Non-pressure chronic ulcer of other part of right foot with fat layer exposed: Secondary | ICD-10-CM

## 2016-08-20 DIAGNOSIS — M79671 Pain in right foot: Secondary | ICD-10-CM

## 2016-08-20 DIAGNOSIS — L97322 Non-pressure chronic ulcer of left ankle with fat layer exposed: Secondary | ICD-10-CM

## 2016-08-22 MED ORDER — NONFORMULARY OR COMPOUNDED ITEM: 1.0000 g | Freq: Four times a day (QID) | 2 refills | Status: DC

## 2016-08-25 NOTE — Progress Notes (Signed)
Subjective:  70 year old female diabetic patient presents to the office today for follow-up evaluation of bilateral ulcerations to the first MPJ right foot and ATVs tendon posterior ankle area left lower extremity. Patient states that she noticed a wound to the right first MPJ over the past few weeks at which time she saw redness on her foot after 08/08/2016. Patient denies trauma. Hemoglobin A1c 11.9 on 12/22/2015.    Objective/Physical Exam General: The patient is alert and oriented x3 in no acute distress.  Dermatology:  Wound #1 noted to the first MPJ of the right foot medially measuring 1.5 cm in length by 1.5 cm in width by 0.1 cm in depth.  Wound #2 located to the left posterior ankle overlying the Achilles tendon area measuring 2.0 cm in length by 1.0 cm in width by 0.1 cm in depth.  To the above-noted ulcerations there is no eschar there is a moderate amount of slough fibrin and necrotic tissue. There is no exposed bone muscle-tendon ligament or joint. Granulation tissue and wound base is red. There is a moderate amount of serosanguineous drainage noted. There is mild malodor. Periwound integrity is erythematous Skin is warm, dry and supple bilateral lower extremities.   Vascular: Palpable pedal pulses bilaterally. Edema noted to the right foot Capillary refill within normal limits.  Neurological: Epicritic and protective threshold diminished bilaterally.   Musculoskeletal Exam: Range of motion within normal limits to all pedal and ankle joints bilateral. Muscle strength 5/5 in all groups bilateral.   Radiographic Exam:  Normal osseous mineralization. Joint spaces preserved. No fracture/dislocation/boney destruction.    Assessment: #1 ulcer right foot secondary to diabetes mellitus. #2 ulcer left posterior ankle secondary to diabetes mellitus #3 diabetes mellitus with peripheral neuropathy #4 and left ankle pain in right foot   Plan of Care:  #1 Patient was evaluated. #2  medically necessary excisional debridement including subcutaneous tissues was performed using a tissue nipper. Excisional debridement of all necrotic nonviable tissue down to healthy bleeding viable tissue was performed with post-debridement measurements and was pre-. #3 the wound was cleansed with normal saline and dry sterile dressing applied. #4 culture results are pending. #5 prescription for Bactrim 2 weeks was dispensed #6 patient was instructed to go to the ER if condition worsens. #7 today prescription for peripheral neuropathy cream was dispensed through [facility] #8 patient is to return to clinic in 1 week   Dr. Edrick Kins, [place]

## 2016-08-29 ENCOUNTER — Ambulatory Visit
Admission: RE | Admit: 2016-08-29 | Discharge: 2016-08-29 | Disposition: A | Discharge status: Home / Self Care | Payer: Medicare Other | Source: Ambulatory Visit | Attending: Urology | Admitting: Urology

## 2016-08-29 ENCOUNTER — Ambulatory Visit: Admission: RE | Admit: 2016-08-29 | Payer: Medicare Other | Source: Ambulatory Visit

## 2016-08-29 DIAGNOSIS — N390 Urinary tract infection, site not specified: Secondary | ICD-10-CM

## 2016-08-29 DIAGNOSIS — K760 Fatty (change of) liver, not elsewhere classified: Secondary | ICD-10-CM | POA: Diagnosis not present

## 2016-08-29 DIAGNOSIS — R3129 Other microscopic hematuria: Secondary | ICD-10-CM | POA: Diagnosis present

## 2016-08-29 HISTORY — DX: Essential (primary) hypertension: I10

## 2016-08-29 LAB — POCT I-STAT CREATININE: Creatinine, Ser: 0.6 mg/dL (ref 0.44–1.00)

## 2016-08-29 MED ORDER — IOPAMIDOL (ISOVUE-300) INJECTION 61%
125.0000 mL | Freq: Once | INTRAVENOUS | Status: AC | PRN
  Administered 2016-08-29: 125 mL via INTRAVENOUS

## 2016-08-30 ENCOUNTER — Ambulatory Visit: Payer: Medicare Other

## 2016-09-03 ENCOUNTER — Ambulatory Visit (INDEPENDENT_AMBULATORY_CARE_PROVIDER_SITE_OTHER): Payer: Medicare Other | Admitting: Podiatry

## 2016-09-03 DIAGNOSIS — M79672 Pain in left foot: Secondary | ICD-10-CM | POA: Diagnosis not present

## 2016-09-03 DIAGNOSIS — M79609 Pain in unspecified limb: Secondary | ICD-10-CM

## 2016-09-03 DIAGNOSIS — E11621 Type 2 diabetes mellitus with foot ulcer: Secondary | ICD-10-CM

## 2016-09-03 DIAGNOSIS — M722 Plantar fascial fibromatosis: Secondary | ICD-10-CM

## 2016-09-03 DIAGNOSIS — L97512 Non-pressure chronic ulcer of other part of right foot with fat layer exposed: Secondary | ICD-10-CM

## 2016-09-03 DIAGNOSIS — L97322 Non-pressure chronic ulcer of left ankle with fat layer exposed: Secondary | ICD-10-CM

## 2016-09-03 DIAGNOSIS — L608 Other nail disorders: Secondary | ICD-10-CM

## 2016-09-03 DIAGNOSIS — I70235 Atherosclerosis of native arteries of right leg with ulceration of other part of foot: Secondary | ICD-10-CM

## 2016-09-03 DIAGNOSIS — E0843 Diabetes mellitus due to underlying condition with diabetic autonomic (poly)neuropathy: Secondary | ICD-10-CM

## 2016-09-03 DIAGNOSIS — L603 Nail dystrophy: Secondary | ICD-10-CM | POA: Diagnosis not present

## 2016-09-03 DIAGNOSIS — B351 Tinea unguium: Secondary | ICD-10-CM | POA: Diagnosis not present

## 2016-09-03 DIAGNOSIS — E1143 Type 2 diabetes mellitus with diabetic autonomic (poly)neuropathy: Secondary | ICD-10-CM

## 2016-09-03 MED ORDER — BETAMETHASONE SOD PHOS & ACET 6 (3-3) MG/ML IJ SUSP: 3.0000 mg | Freq: Once | INTRAMUSCULAR | Status: DC

## 2016-09-03 NOTE — Progress Notes (Signed)
Subjective:  70 year old female diabetic patient presents to the office today for follow-up evaluation of bilateral ulcerations to the first MPJ right foot and achilles tendon posterior ankle area left lower extremity. Patient states that she noticed a wound to the right first MPJ over the past few weeks at which time she saw redness on her foot after 08/08/2016. Patient denies trauma. Hemoglobin A1c 11.9 on 12/22/2015.    Objective/Physical Exam General: The patient is alert and oriented x3 in no acute distress.  Dermatology:  Wound #1 noted to the first MPJ of the right foot medially measuring 1.5 cm in length by 1.0 cm in width by 0.1 cm in depth.  Wound #2 located to the left posterior ankle overlying the Achilles tendon area measuring 2.0 cm in length by 1.0 cm in width by 0.1 cm in depth.  To the above-noted ulcerations there is no eschar there is a moderate amount of slough fibrin and necrotic tissue. There is no exposed bone muscle-tendon ligament or joint. Granulation tissue and wound base is red. There is a moderate amount of serosanguineous drainage noted. There is mild malodor. Periwound integrity is erythematous Skin is warm, dry and supple bilateral lower extremities.   Vascular: Palpable pedal pulses bilaterally. Edema noted to the right foot Capillary refill within normal limits.  Neurological: Epicritic and protective threshold diminished bilaterally.   Musculoskeletal Exam: Pain on palpation to the medial aspect of the plantar fascia at the insertion of the calcaneal tubercle. Range of motion within normal limits to all pedal and ankle joints bilateral. Muscle strength 5/5 in all groups bilateral.   Assessment: #1 ulcer right foot secondary to diabetes mellitus. #2 ulcer left posterior ankle secondary to diabetes mellitus #3 diabetes mellitus with peripheral neuropathy #4 and left ankle pain in right foot   Plan of Care:  #1 Patient was evaluated. #2 medically  necessary excisional debridement including subcutaneous tissues was performed using a tissue nipper. Excisional debridement of all necrotic nonviable tissue down to healthy bleeding viable tissue was performed with post-debridement measurements and was pre-. #3 the wound was cleansed with normal saline and dry sterile dressing applied. #4 continue applying gentamycin cream #5 her insurance denied peripheral neuropathy cream  #6 today injection of 0.5 mL Celestone Soluspan injected in the patient's left plantar fascia the insertion of the calcaneal tubercle.  #7 mechanical debridement of nails performed 1 through 5 bilateral using a nail nipper without incident. #8 patient is return to clinic in 3 weeks.   Dr. Edrick Kins, [place]

## 2016-09-04 ENCOUNTER — Telehealth: Payer: Self-pay | Admitting: *Deleted

## 2016-09-04 NOTE — Telephone Encounter (Signed)
Pt states Dr. Amalia Hailey was to order foot cream, but not a [facility].

## 2016-09-05 ENCOUNTER — Ambulatory Visit: Payer: Medicare Other

## 2016-09-12 ENCOUNTER — Ambulatory Visit: Payer: Medicare Other | Admitting: Podiatry

## 2016-09-17 ENCOUNTER — Ambulatory Visit (INDEPENDENT_AMBULATORY_CARE_PROVIDER_SITE_OTHER): Payer: Medicare Other | Admitting: Urology

## 2016-09-17 DIAGNOSIS — E278 Other specified disorders of adrenal gland: Secondary | ICD-10-CM | POA: Diagnosis not present

## 2016-09-17 DIAGNOSIS — I70235 Atherosclerosis of native arteries of right leg with ulceration of other part of foot: Secondary | ICD-10-CM

## 2016-09-17 DIAGNOSIS — N3020 Other chronic cystitis without hematuria: Secondary | ICD-10-CM | POA: Insufficient documentation

## 2016-09-17 DIAGNOSIS — R3129 Other microscopic hematuria: Secondary | ICD-10-CM | POA: Diagnosis not present

## 2016-09-17 MED ORDER — CIPROFLOXACIN HCL 500 MG PO TABS: 500.0000 mg | ORAL_TABLET | Freq: Once | ORAL | Status: DC

## 2016-09-17 MED ORDER — LIDOCAINE HCL 2 % EX GEL
1.0000 "application " | Freq: Once | CUTANEOUS | Status: AC
  Administered 2016-09-17: 1 via URETHRAL

## 2016-09-17 NOTE — Progress Notes (Signed)
09/17/2016 11:16 AM   [name] December 30, 1945 [id number]  Referring provider: Vista Mink, [place], [place] [address]  No chief complaint on file.   HPI: 1. Microscopic hematuria - blood on UA noted x many. No gross episodes. CT hematuria 09/2016 unremarkable. Cysto 09/2016 unremarkable.  2. Cystitis, chronic - long h/o recurrent cystitis. She is morbidly obese diabeted with A1C >10 and sugars frequently >300. CT 09/2016 without stones or hydro. PVR and Pelvic 2017 normal. UCX's usually MDR e. Coli sens keflex, sens gent, res most others.   3. Adrenal cortical nodule (HCC) LEFT, small  - incidetnal left <1cm cortical nodule on Ct 2017. No h/o refracotry HTN or hypokalemia.   PMH sig for morbid obesity, CHF, chronic pain / chronic narcotics.  Today "[name]" is seen for cysto, PVR and f/u above.    PMH: Past Medical History:  Diagnosis Date  . Amnesia   . Anxiety   . Arthritis   . Back pain   . CHF (congestive heart failure) ([place])   . Chronic pain syndrome   . Cystocele   . Depression   . Diabetes mellitus without complication ([place])   . GERD (gastroesophageal reflux disease)   . Hyperlipidemia   . Hypertension   . IBS (irritable bowel syndrome)   . Myocardial infarction   . Obesity   . Osteoporosis   . Osteoporosis   . Shortness of breath dyspnea   . Sleep apnea    not using now  . Thyroid nodule     Surgical History: Past Surgical History:  Procedure Laterality Date  . ABDOMINAL HYSTERECTOMY    . APPENDECTOMY    . CARDIAC CATHETERIZATION    . CHOLECYSTECTOMY    . FOOT SURGERY    . OOPHORECTOMY    . REPLACEMENT TOTAL KNEE    . TONSILLECTOMY      Home Medications:    Medication List       Accurate as of 09/17/16 11:16 AM. Always use your most recent med list.          busPIRone 30 MG tablet Commonly known as:  BUSPAR Take 30 mg by mouth 2 (two) times daily.   clonazePAM 1 MG tablet Commonly known as:  KLONOPIN Take  1 mg by mouth 3 (three) times daily.   cyclobenzaprine 10 MG tablet Commonly known as:  FLEXERIL Take 10 mg by mouth 3 (three) times daily.   doxepin 25 MG capsule Commonly known as:  SINEQUAN Take 25 mg by mouth at bedtime.   doxycycline 50 MG capsule Commonly known as:  VIBRAMYCIN Take 2 capsules (100 mg total) by mouth 2 (two) times daily.   fluticasone 50 MCG/ACT nasal spray Commonly known as:  FLONASE   furosemide 40 MG tablet Commonly known as:  LASIX Take 40 mg by mouth 2 (two) times daily.   insulin lispro 100 UNIT/ML injection Commonly known as:  HUMALOG Inject 60 Units into the skin daily.   levothyroxine 100 MCG tablet Commonly known as:  SYNTHROID, LEVOTHROID   metFORMIN 1000 MG tablet Commonly known as:  GLUCOPHAGE   metoprolol succinate 25 MG 24 hr tablet Commonly known as:  TOPROL-XL TAKE 2 TABLETS (50 MG TOTAL) BY MOUTH ONCE DAILY.   nitrofurantoin 100 MG capsule Commonly known as:  MACRODANTIN   NONFORMULARY OR COMPOUNDED ITEM Apply 1-2 g topically 4 (four) times daily. Peripheral Neuropathy cream: Bupivacaine 1% Doxepin 3% Gabapentin 6% Ibuprofen 3% Pentoxifylline 3%   nystatin 100000  UNIT/ML suspension Commonly known as:  MYCOSTATIN   nystatin powder Commonly known as:  MYCOSTATIN/NYSTOP Apply 1 Bottle topically 2 (two) times daily. Breast/stomach folds   oxybutynin 5 MG 24 hr tablet Commonly known as:  DITROPAN-XL   Oxycodone HCl 20 MG Tabs Take 1 tablet (20 mg total) by mouth 4 (four) times daily.   pioglitazone 45 MG tablet Commonly known as:  ACTOS Take 45 mg by mouth daily.   simvastatin 40 MG tablet Commonly known as:  ZOCOR   SUBOXONE 4-1 MG Film Generic drug:  Buprenorphine HCl-Naloxone HCl   sulfamethoxazole-trimethoprim 800-160 MG tablet Commonly known as:  BACTRIM DS,SEPTRA DS Take 1 tablet by mouth 2 (two) times daily.   tolterodine 4 MG 24 hr capsule Commonly known as:  DETROL LA Take 1 capsule (4 mg total) by mouth  daily. Start with 1/2 tablet daily x 1 week, then increase to whole tablet if needed.       Allergies: No Known Allergies  Family History: Family History  Problem Relation Age of Onset  . Cancer Mother   . Cancer Father   . Cancer Sister   . Thyroid disease Neg Hx     Social History:  reports that she has never smoked. Her smokeless tobacco use includes Chew. She reports that she does not drink alcohol. Her drug history is not on file.    Review of Systems  Gastrointestinal (upper)  : Negative for upper GI symptoms  Gastrointestinal (lower) : Negative for lower GI symptoms  Constitutional : Negative for symptoms  Skin: Negative for skin symptoms  Eyes: Negative for eye symptoms  Ear/Nose/Throat : Negative for Ear/Nose/Throat symptoms  Hematologic/Lymphatic: Negative for Hematologic/Lymphatic symptoms  Cardiovascular : Negative for cardiovascular symptoms  Respiratory : Negative for respiratory symptoms  Endocrine: Negative for endocrine symptoms  Musculoskeletal: Negative for musculoskeletal symptoms  Neurological: Negative for neurological symptoms  Psychologic: Negative for psychiatric symptoms    Physical Exam: There were no vitals taken for this visit.  Constitutional:  Alert and oriented, No acute distress. HEENT: [place] AT, moist mucus membranes.  Trachea midline, no masses. Cardiovascular: No clubbing, cyanosis, or edema. Respiratory: Normal respiratory effort, no increased work of breathing. GI: Abdomen is soft, nontender, nondistended, no abdominal masses GU: No CVA tenderness.  Morbid truncal obesity limits sensitiviety of exam. No palpable vaignal masses or severe prolapse.  Skin: No rashes, bruises or suspicious lesions. Lymph: No cervical or inguinal adenopathy. Neurologic: Grossly intact, no focal deficits, moving all 4 extremities. Psychiatric: Normal mood and affect.  Laboratory Data: Lab Results  Component Value Date   WBC 6.9  12/22/2015   HGB 14.6 12/22/2015   HCT 43.4 12/22/2015   MCV 88.4 12/22/2015   PLT 149 (L) 12/22/2015    Lab Results  Component Value Date   CREATININE 0.60 08/29/2016    No results found for: PSA  No results found for: TESTOSTERONE  Lab Results  Component Value Date   HGBA1C 11.9 (H) 12/22/2015    Urinalysis    Component Value Date/Time   COLORURINE Yellow 01/30/2014 0026   COLORURINE RED BIOCHEMICALS MAY BE AFFECTED BY COLOR (A) 07/11/2008 1245   APPEARANCEUR Cloudy (A) 07/26/2016 1119   LABSPEC 1.024 01/30/2014 0026   PHURINE 6.0 01/30/2014 0026   PHURINE 5.0 07/11/2008 1245   GLUCOSEU 2+ (A) 07/26/2016 1119   GLUCOSEU >=500 01/30/2014 0026   HGBUR 3+ 01/30/2014 0026   HGBUR LARGE (A) 07/11/2008 1245   BILIRUBINUR Negative 07/26/2016 1119   BILIRUBINUR  Negative 01/30/2014 0026   KETONESUR Negative 01/30/2014 0026   KETONESUR 40 (A) 07/11/2008 1245   PROTEINUR 1+ (A) 07/26/2016 1119   PROTEINUR Negative 01/30/2014 0026   PROTEINUR 100 (A) 07/11/2008 1245   UROBILINOGEN 1.0 07/11/2008 1245   NITRITE Positive (A) 07/26/2016 1119   NITRITE Negative 01/30/2014 0026   NITRITE POSITIVE (A) 07/11/2008 1245   LEUKOCYTESUR 1+ (A) 07/26/2016 1119   LEUKOCYTESUR Trace 01/30/2014 0026     09/17/16  CC:  Chief Complaint  Patient presents with  . Cysto    HPI:  Blood pressure 140/85, pulse (!) 102, height 5\' 5"  (1.651 m), weight 125.8 kg (277 lb 6.4 oz). NED. A&Ox3.   No respiratory distress   Abd soft, NT, ND Normal external genitalia with patent urethral meatus  Cystoscopy Procedure Note  Patient identification was confirmed, informed consent was obtained, and patient was prepped using Betadine solution.  Lidocaine jelly was administered per urethral meatus.    Preoperative abx where received prior to procedure.    Procedure: - Flexible cystoscope introduced, without any difficulty.   - Thorough search of the bladder revealed:    normal urethral  meatus    normal urothelium    no stones    no ulcers     no tumors    no urethral polyps    no trabeculation  - Ureteral orifices were normal in position and appearance.  Post-Procedure: - Patient tolerated the procedure well  Assessment/ Plan:    Alexis Frock, MD  Pertinent Imaging: As per HPI  Assessment & Plan:    1. Microscopic hematuria - negative for worriseom etiology (stone, cancers) with complete eval. WOuld not repeat eval unless recurrent gross / visible blood onliy.   2. Cystitis, chronic - this is mostly driven by her diabetes and obesity which I cannot surgically correct. Strongly rec weight loss, strict glycemic control, and self-start keflex. [id number] today. Provider eval for breakthoughs onliy.   3. Adrenal cortical nodule (HCC) LEFT, small - <4cm and non-functional. No indication for further eval.  RTC 1 year to verify no gross hematuira, then prn if stable.    No Follow-up on file.  Alexis Frock, [facility] [address] [place], [place] [address] [phone]

## 2016-10-01 ENCOUNTER — Ambulatory Visit (INDEPENDENT_AMBULATORY_CARE_PROVIDER_SITE_OTHER): Payer: Medicare Other | Admitting: Podiatry

## 2016-10-01 DIAGNOSIS — L97509 Non-pressure chronic ulcer of other part of unspecified foot with unspecified severity: Secondary | ICD-10-CM

## 2016-10-01 DIAGNOSIS — E1143 Type 2 diabetes mellitus with diabetic autonomic (poly)neuropathy: Secondary | ICD-10-CM

## 2016-10-01 DIAGNOSIS — E0843 Diabetes mellitus due to underlying condition with diabetic autonomic (poly)neuropathy: Secondary | ICD-10-CM

## 2016-10-01 DIAGNOSIS — I70235 Atherosclerosis of native arteries of right leg with ulceration of other part of foot: Secondary | ICD-10-CM

## 2016-10-01 DIAGNOSIS — E08621 Diabetes mellitus due to underlying condition with foot ulcer: Secondary | ICD-10-CM

## 2016-10-02 NOTE — Progress Notes (Signed)
Subjective:  70 year old female diabetic patient presents to the office today for follow-up evaluation of bilateral ulcerations to the first MPJ right foot and achilles tendon posterior ankle area left lower extremity. Patient states that she noticed a wound to the right first MPJ over the past few weeks at which time she saw redness on her foot after 08/08/2016. Patient denies trauma. Hemoglobin A1c 11.9 on 12/22/2015.    Objective/Physical Exam General: The patient is alert and oriented x3 in no acute distress.  Dermatology:  Wound #1 noted to the first MPJ of the right foot medially measuring 1.5 cm in length by 1.0 cm in width by 0.1 cm in depth.  Wound #2 located to the left posterior ankle overlying the Achilles tendon area has healed. Complete reepithelialization has occurred.  To the above-noted ulcerations there is no eschar there is a moderate amount of slough fibrin and necrotic tissue. There is no exposed bone muscle-tendon ligament or joint. Granulation tissue and wound base is red. There is a moderate amount of serosanguineous drainage noted. There is mild malodor. Periwound integrity is erythematous Skin is warm, dry and supple bilateral lower extremities.   Vascular: Palpable pedal pulses bilaterally. Edema noted to the right foot Capillary refill within normal limits.  Neurological: Epicritic and protective threshold diminished bilaterally.   Musculoskeletal Exam: Pain on palpation to the medial aspect of the plantar fascia at the insertion of the calcaneal tubercle. Range of motion within normal limits to all pedal and ankle joints bilateral. Muscle strength 5/5 in all groups bilateral.   Assessment: #1 ulcer right foot secondary to diabetes mellitus. #2 ulcer left posterior ankle secondary to diabetes mellitus #3 diabetes mellitus with peripheral neuropathy #4 and left ankle pain in right foot   Plan of Care:  #1 Patient was evaluated. #2 medically necessary  excisional debridement including subcutaneous tissues was performed using a tissue nipper. Excisional debridement of all necrotic nonviable tissue down to healthy bleeding viable tissue was performed with post-debridement measurements and was pre-. #3 the wound was cleansed with normal saline and dry sterile dressing applied. #4 continue applying gentamycin cream and daily wound care. #5 return to clinic in 2 weeks  Dr. Edrick Kins, [place]

## 2016-11-12 ENCOUNTER — Ambulatory Visit: Payer: Medicare Other | Admitting: Podiatry

## 2016-11-15 ENCOUNTER — Ambulatory Visit: Payer: Medicare Other | Admitting: Podiatry

## 2016-11-26 ENCOUNTER — Ambulatory Visit: Payer: Medicare Other | Admitting: Podiatry

## 2016-12-02 ENCOUNTER — Other Ambulatory Visit: Payer: Self-pay | Admitting: Obstetrics and Gynecology

## 2016-12-02 DIAGNOSIS — R351 Nocturia: Secondary | ICD-10-CM

## 2016-12-10 ENCOUNTER — Ambulatory Visit: Payer: Medicare Other | Admitting: Podiatry

## 2016-12-19 ENCOUNTER — Ambulatory Visit: Payer: Medicare Other | Admitting: Podiatry

## 2016-12-24 ENCOUNTER — Ambulatory Visit (INDEPENDENT_AMBULATORY_CARE_PROVIDER_SITE_OTHER): Payer: Medicare Other | Admitting: Podiatry

## 2016-12-24 ENCOUNTER — Encounter: Payer: Self-pay | Admitting: Podiatry

## 2016-12-24 DIAGNOSIS — I70235 Atherosclerosis of native arteries of right leg with ulceration of other part of foot: Secondary | ICD-10-CM | POA: Diagnosis not present

## 2016-12-24 DIAGNOSIS — R2689 Other abnormalities of gait and mobility: Secondary | ICD-10-CM | POA: Diagnosis not present

## 2016-12-24 DIAGNOSIS — E1143 Type 2 diabetes mellitus with diabetic autonomic (poly)neuropathy: Secondary | ICD-10-CM | POA: Diagnosis not present

## 2016-12-24 NOTE — Progress Notes (Signed)
   Subjective:  Patient presents today for follow-up evaluation of ulcerations to the right foot secondary to diabetes mellitus. Patient states that she's feeling very well regarding the ulcers. Patient also states that she has fallen approximately 7 times the last 2 weeks due to imbalance issues. Patient feels very unstable when walking.    Objective/Physical Exam General: The patient is alert and oriented x3 in no acute distress.  Dermatology: Skin is warm, dry and supple bilateral lower extremities. Negative for open lesions or macerations.  Vascular: Palpable pedal pulses bilaterally. No edema or erythema noted. Capillary refill within normal limits.  Neurological: Epicritic and protective threshold grossly intact bilaterally.   Musculoskeletal Exam: Range of motion within normal limits to all pedal and ankle joints bilateral. Muscle strength 5/5 in all groups bilateral.   Assessment: #1 ulcer right foot secondary diabetes mellitus-resolved #2 callus right great toe #3 chronic instability during ambulation   Plan of Care:  #1 Patient was evaluated. #2 appointment with Benjie Karvonen for a [facility] Balance Brace to the bilateral lower extremities. #3 return to clinic when necessary   Edrick Kins, DPM [facility]  Dr. Edrick Kins, [place]                                        [place], [place] [address]                Office [phone]  Fax [phone]

## 2017-01-08 ENCOUNTER — Ambulatory Visit (INDEPENDENT_AMBULATORY_CARE_PROVIDER_SITE_OTHER): Payer: Self-pay | Admitting: *Deleted

## 2017-01-08 DIAGNOSIS — I70235 Atherosclerosis of native arteries of right leg with ulceration of other part of foot: Secondary | ICD-10-CM

## 2017-01-08 DIAGNOSIS — E1143 Type 2 diabetes mellitus with diabetic autonomic (poly)neuropathy: Secondary | ICD-10-CM

## 2017-01-22 NOTE — Progress Notes (Signed)
Patient presents for casting for [facility]aces. Will notify pt once they arrive.

## 2017-02-05 ENCOUNTER — Other Ambulatory Visit: Payer: Medicare Other

## 2017-02-06 ENCOUNTER — Ambulatory Visit (INDEPENDENT_AMBULATORY_CARE_PROVIDER_SITE_OTHER): Payer: Medicare Other

## 2017-02-06 DIAGNOSIS — I70235 Atherosclerosis of native arteries of right leg with ulceration of other part of foot: Secondary | ICD-10-CM | POA: Diagnosis not present

## 2017-02-06 DIAGNOSIS — M79671 Pain in right foot: Secondary | ICD-10-CM

## 2017-02-06 DIAGNOSIS — E1143 Type 2 diabetes mellitus with diabetic autonomic (poly)neuropathy: Secondary | ICD-10-CM | POA: Diagnosis not present

## 2017-02-10 ENCOUNTER — Other Ambulatory Visit: Payer: Self-pay | Admitting: Obstetrics and Gynecology

## 2017-02-10 DIAGNOSIS — Z1231 Encounter for screening mammogram for malignant neoplasm of breast: Secondary | ICD-10-CM

## 2017-02-20 ENCOUNTER — Ambulatory Visit: Payer: Medicare Other | Attending: Pain Medicine | Admitting: Pain Medicine

## 2017-03-11 ENCOUNTER — Other Ambulatory Visit: Payer: Self-pay | Admitting: Gastroenterology

## 2017-03-11 DIAGNOSIS — R1310 Dysphagia, unspecified: Secondary | ICD-10-CM

## 2017-03-14 ENCOUNTER — Ambulatory Visit: Payer: Medicare Other

## 2017-03-18 ENCOUNTER — Ambulatory Visit (INDEPENDENT_AMBULATORY_CARE_PROVIDER_SITE_OTHER): Payer: Medicare Other | Admitting: Podiatry

## 2017-03-18 DIAGNOSIS — M722 Plantar fascial fibromatosis: Secondary | ICD-10-CM

## 2017-03-18 DIAGNOSIS — M25471 Effusion, right ankle: Secondary | ICD-10-CM

## 2017-03-18 DIAGNOSIS — E0842 Diabetes mellitus due to underlying condition with diabetic polyneuropathy: Secondary | ICD-10-CM | POA: Diagnosis not present

## 2017-03-18 DIAGNOSIS — M25472 Effusion, left ankle: Secondary | ICD-10-CM

## 2017-03-19 NOTE — Progress Notes (Signed)
   Subjective: Patient presents today for pain and tenderness in the left foot. Patient states the foot pain has been hurting for several weeks now. Patient states that it hurts in the mornings with the first steps out of bed. She reports associated swelling. She states she wants injections to help alleviate the pain. Patient presents today for further treatment and evaluation  Objective: Physical Exam General: The patient is alert and oriented x3 in no acute distress.  Dermatology: Skin is warm, dry and supple bilateral lower extremities. Negative for open lesions or macerations bilateral.   Vascular: Dorsalis Pedis and Posterior Tibial pulses palpable bilateral.  Capillary fill time is immediate to all digits.  Neurological: Epicritic and protective threshold intact bilateral.   Musculoskeletal: Tenderness to palpation at the medial calcaneal tubercale and through the insertion of the plantar fascia of the left foot. All other joints range of motion within normal limits bilateral. Strength 5/5 in all groups bilateral.    Assessment: 1. Plantar fasciitis left foot 2. Pain in left foot 3. Bilateral foot and ankle edema 4. DM with neuropathy  Plan of Care:   1. Patient evaluated.   2. Injection of 0.5cc Celestone soluspan injected into the left plantar fascia.  3. Instructed patient regarding therapies and modalities at home to alleviate symptoms.  4. Recommended compression socks up to the knee. 5. Return to clinic when necessary.     Edrick Kins, DPM [facility]  Dr. Edrick Kins, [place]                                        [place], [place] [address]                Office [phone]  Fax [phone]

## 2017-03-22 MED ORDER — BETAMETHASONE SOD PHOS & ACET 6 (3-3) MG/ML IJ SUSP: 3.0000 mg | Freq: Once | INTRAMUSCULAR | Status: DC

## 2017-03-24 ENCOUNTER — Emergency Department: Payer: Medicare Other

## 2017-03-24 ENCOUNTER — Emergency Department
Admission: EM | Admit: 2017-03-24 | Discharge: 2017-03-24 | Disposition: A | Discharge status: Home / Self Care | Payer: Medicare Other | Attending: Emergency Medicine | Admitting: Emergency Medicine

## 2017-03-24 ENCOUNTER — Encounter: Payer: Self-pay | Admitting: Emergency Medicine

## 2017-03-24 DIAGNOSIS — R109 Unspecified abdominal pain: Secondary | ICD-10-CM | POA: Diagnosis not present

## 2017-03-24 DIAGNOSIS — I110 Hypertensive heart disease with heart failure: Secondary | ICD-10-CM | POA: Diagnosis not present

## 2017-03-24 DIAGNOSIS — I252 Old myocardial infarction: Secondary | ICD-10-CM | POA: Insufficient documentation

## 2017-03-24 DIAGNOSIS — R300 Dysuria: Secondary | ICD-10-CM | POA: Diagnosis present

## 2017-03-24 DIAGNOSIS — Z79899 Other long term (current) drug therapy: Secondary | ICD-10-CM | POA: Diagnosis not present

## 2017-03-24 DIAGNOSIS — Z794 Long term (current) use of insulin: Secondary | ICD-10-CM | POA: Diagnosis not present

## 2017-03-24 DIAGNOSIS — I509 Heart failure, unspecified: Secondary | ICD-10-CM | POA: Insufficient documentation

## 2017-03-24 DIAGNOSIS — E119 Type 2 diabetes mellitus without complications: Secondary | ICD-10-CM | POA: Insufficient documentation

## 2017-03-24 DIAGNOSIS — F17290 Nicotine dependence, other tobacco product, uncomplicated: Secondary | ICD-10-CM | POA: Insufficient documentation

## 2017-03-24 DIAGNOSIS — N390 Urinary tract infection, site not specified: Secondary | ICD-10-CM | POA: Diagnosis not present

## 2017-03-24 LAB — COMPREHENSIVE METABOLIC PANEL
ALT: 46 U/L (ref 14–54)
AST: 42 U/L — ABNORMAL HIGH (ref 15–41)
Albumin: 4 g/dL (ref 3.5–5.0)
Alkaline Phosphatase: 69 U/L (ref 38–126)
Anion gap: 8 (ref 5–15)
BUN: 24 mg/dL — ABNORMAL HIGH (ref 6–20)
CO2: 28 mmol/L (ref 22–32)
Calcium: 9.2 mg/dL (ref 8.9–10.3)
Chloride: 100 mmol/L — ABNORMAL LOW (ref 101–111)
Creatinine, Ser: 0.59 mg/dL (ref 0.44–1.00)
GFR calc Af Amer: >60 mL/min (ref >60)
GFR calc non Af Amer: >60 mL/min (ref >60)
Glucose, Bld: 213 mg/dL — ABNORMAL HIGH (ref 65–99)
Potassium: 4.1 mmol/L (ref 3.5–5.1)
Sodium: 136 mmol/L (ref 135–145)
Total Bilirubin: 0.7 mg/dL (ref 0.3–1.2)
Total Protein: 7.2 g/dL (ref 6.5–8.1)

## 2017-03-24 LAB — URINALYSIS, COMPLETE (UACMP) WITH MICROSCOPIC
Bilirubin Urine: NEGATIVE
Glucose, UA: 50 mg/dL — AB
Ketones, ur: 5 mg/dL — AB
Nitrite: POSITIVE — AB
Protein, ur: 30 mg/dL — AB
Specific Gravity, Urine: 1.029 (ref 1.005–1.030)
pH: 5 (ref 5.0–8.0)

## 2017-03-24 LAB — CBC
HCT: 38.3 % (ref 35.0–47.0)
Hemoglobin: 12.7 g/dL (ref 12.0–16.0)
MCH: 29.3 pg (ref 26.0–34.0)
MCHC: 33.2 g/dL (ref 32.0–36.0)
MCV: 88.2 fL (ref 80.0–100.0)
Platelets: 147 10*3/uL — ABNORMAL LOW (ref 150–440)
RBC: 4.34 MIL/uL (ref 3.80–5.20)
RDW: 14.1 % (ref 11.5–14.5)
WBC: 8.5 10*3/uL (ref 3.6–11.0)

## 2017-03-24 MED ORDER — CEFTRIAXONE SODIUM IN DEXTROSE 20 MG/ML IV SOLN
1.0000 g | Freq: Once | INTRAVENOUS | Status: AC
  Administered 2017-03-24: 1 g via INTRAVENOUS
  Filled 2017-03-24: qty 50

## 2017-03-24 MED ORDER — MEROPENEM-SODIUM CHLORIDE 1 GM/50ML IV SOLR
1.0000 g | Freq: Once | INTRAVENOUS | Status: DC
  Filled 2017-03-24: qty 50

## 2017-03-24 MED ORDER — MEROPENEM 1 G IV SOLR: 1.0000 g | Freq: Three times a day (TID) | INTRAVENOUS | Status: DC

## 2017-03-24 MED ORDER — SODIUM CHLORIDE 0.9 % IV SOLN
1.0000 g | Freq: Once | INTRAVENOUS | Status: AC
  Administered 2017-03-24: 1 g via INTRAVENOUS
  Filled 2017-03-24: qty 1

## 2017-03-24 NOTE — ED Notes (Signed)
ED Provider at bedside. 

## 2017-03-24 NOTE — ED Provider Notes (Signed)
[facility] Emergency Department Provider Note  Time seen: 5:29 PM  I have reviewed the triage vital signs and the nursing notes.   HISTORY  Chief Complaint Dysuria and Flank Pain    HPI BAYLEIGH LOFLIN is a 71 y.o. female with a past medical history of anxiety, CHF, chronic pain, gastric reflux, hypertension, hyperlipidemia, recurrent urinary tract infections, who presents to the emergency department for dysuria, hematuria and abdominal discomfort. According to the patient for the past 2-3 weeks she has been experiencing lower to right sided abdominal pain. She states it feels similar to her prior urinary tract infections. Patient went to her primary care doctor Thursday and was diagnosed with urinary tract infection and was told to go to the emergency department for IV antibiotics but she did not. She states she felt worse over the weekend so she came to the ER today for evaluation. Denies any nausea or vomiting. Denies diarrhea. Patient states she has had intermittent subjective fever over the past 2 weeks, none currently. Describes her abdominal discomfort as dull/aching. SHe does state dysuria with occasional hematuria.  Past Medical History:  Diagnosis Date  . Amnesia   . Anxiety   . Arthritis   . Back pain   . CHF (congestive heart failure) ([place])   . Chronic pain syndrome   . Cystocele   . Depression   . Diabetes mellitus without complication ([place])   . GERD (gastroesophageal reflux disease)   . Hyperlipidemia   . Hypertension   . IBS (irritable bowel syndrome)   . Myocardial infarction ([place])   . Obesity   . Osteoporosis   . Osteoporosis   . Shortness of breath dyspnea   . Sleep apnea    not using now  . Thyroid nodule     Patient Active Problem List   Diagnosis Date Noted  . Microscopic hematuria 09/17/2016  . Cystitis, chronic 09/17/2016  . Adrenal cortical nodule ([place]) LEFT, small 09/17/2016  . Closed fracture of metatarsal bone 07/22/2016   . Plantar fasciitis, left 05/28/2016  . Degenerative spondylolisthesis 01/02/2016  . Tachycardia 01/02/2016  . Diabetes mellitus with complication ([place])   . Essential hypertension   . Non-toxic multinodular goiter 10/11/2014  . Diabetes mellitus ([place]) 10/11/2014  . Hyperlipidemia 10/11/2014  . Anxiety and depression 10/11/2014  . Osteoporosis 10/11/2014  . GERD (gastroesophageal reflux disease) 10/11/2014  . Arthritis 10/11/2014  . Cystocele 10/11/2014  . Chronic pain syndrome 10/11/2014  . Amnesia 10/11/2014  . Back pain 10/11/2014    Past Surgical History:  Procedure Laterality Date  . ABDOMINAL HYSTERECTOMY    . APPENDECTOMY    . CARDIAC CATHETERIZATION    . CHOLECYSTECTOMY    . FOOT SURGERY    . OOPHORECTOMY    . REPLACEMENT TOTAL KNEE    . TONSILLECTOMY      Prior to Admission medications   Medication Sig Start Date End Date Taking? Authorizing Provider  busPIRone (BUSPAR) 30 MG tablet Take 30 mg by mouth 2 (two) times daily.    [provider]  ciprofloxacin (CIPRO) 500 MG tablet  10/09/16   [provider]  clonazePAM (KLONOPIN) 0.5 MG tablet  02/17/17   [provider]  cyclobenzaprine (FLEXERIL) 10 MG tablet Take 10 mg by mouth 3 (three) times daily.    [provider]  diclofenac sodium (VOLTAREN) 1 % GEL Apply topically.    [provider]  doxepin (SINEQUAN) 25 MG capsule Take 25 mg by mouth at bedtime.  [provider]  fluticasone Asencion Islam) 50 MCG/ACT nasal spray  07/06/16   [provider]  furosemide (LASIX) 40 MG tablet Take 40 mg by mouth 2 (two) times daily.    [provider]  gabapentin (NEURONTIN) 300 MG capsule Take 300 mg by mouth 3 (three) times daily. 03/06/17   [provider]  insulin lispro (HUMALOG) 100 UNIT/ML injection Inject 60 Units into the skin daily.     [provider]  LANTUS SOLOSTAR 100 UNIT/ML Solostar Pen  12/04/16   [provider]   levothyroxine (SYNTHROID, LEVOTHROID) 100 MCG tablet  08/27/16   [provider]  levothyroxine (SYNTHROID, LEVOTHROID) 125 MCG tablet  11/05/16   [provider]  levothyroxine (SYNTHROID, LEVOTHROID) 150 MCG tablet  11/26/16   [provider]  lisinopril-hydrochlorothiazide (PRINZIDE,ZESTORETIC) 10-12.5 MG tablet Take by mouth.    [provider]  meloxicam (MOBIC) 7.5 MG tablet Take by mouth.    [provider]  metFORMIN (GLUCOPHAGE) 1000 MG tablet  07/24/16   [provider]  metoprolol succinate (TOPROL-XL) 25 MG 24 hr tablet TAKE 2 TABLETS (50 MG TOTAL) BY MOUTH ONCE DAILY. 07/24/16   [provider]  NONFORMULARY OR COMPOUNDED ITEM Apply 1-2 g topically 4 (four) times daily. Peripheral Neuropathy cream: Bupivacaine 1% Doxepin 3% Gabapentin 6% Ibuprofen 3% Pentoxifylline 3% Patient not taking: Reported on 09/17/2016 08/22/16   Edrick Kins, DPM  nystatin (MYCOSTATIN) 100000 UNIT/ML suspension  04/29/16   [provider]  nystatin (MYCOSTATIN) powder Apply 1 Bottle topically 2 (two) times daily. Breast/stomach folds 08/04/14   [provider]  Omega-3 Fatty Acids (FISH OIL PO) Take by mouth.    [provider]  oxybutynin (DITROPAN-XL) 5 MG 24 hr tablet  07/24/16   [provider]  pioglitazone (ACTOS) 45 MG tablet Take 45 mg by mouth daily. 02/02/15   [provider]  potassium chloride 20 MEQ/15ML (10%) SOLN Take by mouth.    [provider]  pravastatin (PRAVACHOL) 40 MG tablet Take by mouth.    [provider]  simvastatin (ZOCOR) 40 MG tablet  07/24/16   [provider]  sulfamethoxazole-trimethoprim (BACTRIM DS,SEPTRA DS) 800-160 MG tablet  12/04/16   [provider]  tolterodine (DETROL LA) 4 MG 24 hr capsule Take 1 capsule (4 mg total) by mouth daily. Start with 1/2 tablet daily x 1 week, then increase to whole tablet if needed. Patient not taking:  Reported on 09/17/2016 10/03/15   Rubie Maid, MD    No Known Allergies  Family History  Problem Relation Age of Onset  . Cancer Mother   . Cancer Father   . Cancer Sister   . Thyroid disease Neg Hx     Social History Social History  Substance Use Topics  . Smoking status: Never Smoker  . Smokeless tobacco: Current User    Types: Chew  . Alcohol use No    Review of Systems Constitutional: Intermittent subjective fever Eyes: Negative for visual changes. ENT: Negative for congestion Cardiovascular: Negative for chest pain. Respiratory: Negative for shortness of breath. Gastrointestinal: Lower and right-sided abdominal discomfort. Negative for nausea vomiting or diarrhea. Genitourinary: Moderate dysuria, occasional hematuria. Musculoskeletal: Negative for back pain. Skin: Negative for rash. Neurological: Negative for headache All other ROS negative  ____________________________________________   PHYSICAL EXAM:  VITAL SIGNS: ED Triage Vitals [03/24/17 1449]  Enc Vitals Group     BP (!) 121/57     Pulse Rate 73  Resp 18     Temp 98.3 F (36.8 C)     Temp Source Oral     SpO2 99 %     Weight 260 lb (117.9 kg)     Height 5\' 4"  (1.626 m)     Head Circumference      Peak Flow      Pain Score      Pain Loc      Pain Edu?      Excl. in [place]?     Constitutional: Alert and oriented. Well appearing and in no distress. Eyes: Normal exam ENT   Head: Normocephalic and atraumatic   Mouth/Throat: Mucous membranes are moist. Cardiovascular: Normal rate, regular rhythm. No murmur Respiratory: Normal respiratory effort without tachypnea nor retractions. Breath sounds are clear  Gastrointestinal: Soft, slight suprapubic tenderness palpation otherwise nontender abdomen. No rebound or guarding. No distention. Musculoskeletal: Nontender with normal range of motion in all extremities. Neurologic:  Normal speech and language. No gross focal neurologic deficits   Skin:  Skin is warm, dry and intact.  Psychiatric: Mood and affect are normal.  ____________________________________________    INITIAL IMPRESSION / ASSESSMENT AND PLAN / ED COURSE  Pertinent labs & imaging results that were available during my care of the patient were reviewed by me and considered in my medical decision making (see chart for details).  Patient presents to the emergency department with dysuria, intermittent hematuria. Patient has a history of recurrent urinary tract infections. I spoke to the patient's primary care physician Dr. Laural Benes, she sent the patient to the emergency department as the patient has a history of frequent ESBL urinary tract infections that have only been sensitive to meropenem/imipenem in the past. Given the patient's right flank pain we'll obtain a CT renal scan to rule out kidney stone although the patient denies any history of kidney stone. I have sent a urine culture on our urinalysis. We will treat with IV meropenem in the emergency department. The patient is arranging for the patient to come to the office tomorrow to receive another dose of imipenem, and then she will be set up for home health for home imipenem injections. Patient is agreeable to this plan.  Patient has completed her meropenem in the emergency department. She will follow-up tomorrow in the office for an imipenem injection, and then will be set up for home health per Dr. Laural Benes. Patient agreeable to this plan.  ____________________________________________   FINAL CLINICAL IMPRESSION(S) / ED DIAGNOSES  Urinary tract infection    Harvest Dark, MD 03/24/17 [phone]

## 2017-03-24 NOTE — ED Triage Notes (Signed)
States has R flank pain x 1 month. States went to doctor 3 days ago and that they told her she had a UTI and that they did not think oral antibiotics would be effective.

## 2017-03-24 NOTE — Discharge Instructions (Signed)
As we discussed please follow-up with Dr. Laural Benes tomorrow for another dose of antibiotics. Return to the emergency department for any fever, or any other symptom personally concerning to yourself.

## 2017-03-24 NOTE — ED Notes (Signed)
Patient denies pain and is resting comfortably.  

## 2017-03-24 NOTE — ED Notes (Signed)
Upon stopping IV antibiotic noticed swelling around IV site. Called pharmacy whom recommended warm compresses on the site. Informed pt to do the same upon d/c. Made charge nurse aware.

## 2017-03-27 LAB — URINE CULTURE: Culture: >=100000 — AB

## 2017-05-06 ENCOUNTER — Ambulatory Visit: Payer: Medicare Other | Admitting: Podiatry

## 2017-05-09 ENCOUNTER — Ambulatory Visit (INDEPENDENT_AMBULATORY_CARE_PROVIDER_SITE_OTHER): Payer: Medicare Other | Admitting: Podiatry

## 2017-05-09 DIAGNOSIS — I70245 Atherosclerosis of native arteries of left leg with ulceration of other part of foot: Secondary | ICD-10-CM

## 2017-05-09 DIAGNOSIS — L97522 Non-pressure chronic ulcer of other part of left foot with fat layer exposed: Secondary | ICD-10-CM

## 2017-05-09 DIAGNOSIS — E0842 Diabetes mellitus due to underlying condition with diabetic polyneuropathy: Secondary | ICD-10-CM

## 2017-05-09 DIAGNOSIS — M722 Plantar fascial fibromatosis: Secondary | ICD-10-CM

## 2017-05-13 NOTE — Progress Notes (Signed)
   Subjective: Patient presents today for follow up evaluation of left plantar fasciitis. He states his pain is unchanged at this time. She also reports a painful callus on the left third toe. She describes the pain as a constant burning. She is here for further evaluation and treatment.   Objective: Physical Exam General: The patient is alert and oriented x3 in no acute distress.  Dermatology: Wound #1 noted to the 0.5 x 0.8 x 0.1 cm (LxWxD).   To the noted ulceration(s), there is no eschar. There is a moderate amount of slough, fibrin, and necrotic tissue noted. Granulation tissue and wound base is red. There is a minimal amount of serosanguineous drainage noted. There is no exposed bone muscle-tendon ligament or joint. There is no malodor. Periwound integrity is intact. Skin is warm, dry and supple bilateral lower extremities.  Vascular: Dorsalis Pedis and Posterior Tibial pulses palpable bilateral.  Capillary fill time is immediate to all digits.  Neurological: Epicritic and protective threshold intact bilateral.   Musculoskeletal: Tenderness to palpation at the medial calcaneal tubercale and through the insertion of the plantar fascia of the left foot. All other joints range of motion within normal limits bilateral. Strength 5/5 in all groups bilateral.    Assessment: 1. Plantar fasciitis left foot 2. Ulcer third left digit secondary to DM  Plan of Care:   1. Patient evaluated.   2. Injection of 0.5cc Celestone soluspan injected into the left plantar fascia.  3. medically necessary excisional debridement including subcutaneous tissue was performed using a tissue nipper and a chisel blade. Excisional debridement of all the necrotic nonviable tissue down to healthy bleeding viable tissue was performed with post-debridement measurements same as pre-. 4. the wound was cleansed and dry sterile dressing applied. 5. Authorization for DM shoes initiated today. Pt molded for DM shoes.  6.  Return to clinic in 3 weeks.      Edrick Kins, DPM [facility]  Dr. Edrick Kins, [address]                                        [place], [place] [address]                Office [phone]  Fax [phone]

## 2017-05-16 MED ORDER — BETAMETHASONE SOD PHOS & ACET 6 (3-3) MG/ML IJ SUSP: 3.0000 mg | Freq: Once | INTRAMUSCULAR | Status: DC

## 2017-05-27 ENCOUNTER — Ambulatory Visit: Payer: Medicare Other | Admitting: Podiatry

## 2017-06-12 ENCOUNTER — Ambulatory Visit: Payer: Medicare Other | Admitting: Podiatry

## 2017-06-24 ENCOUNTER — Ambulatory Visit: Payer: Medicare Other | Admitting: Podiatry

## 2017-07-01 ENCOUNTER — Ambulatory Visit (INDEPENDENT_AMBULATORY_CARE_PROVIDER_SITE_OTHER): Payer: Medicare Other

## 2017-07-01 ENCOUNTER — Ambulatory Visit (INDEPENDENT_AMBULATORY_CARE_PROVIDER_SITE_OTHER): Payer: Medicare Other | Admitting: Podiatry

## 2017-07-01 DIAGNOSIS — E0842 Diabetes mellitus due to underlying condition with diabetic polyneuropathy: Secondary | ICD-10-CM

## 2017-07-01 DIAGNOSIS — R600 Localized edema: Secondary | ICD-10-CM | POA: Diagnosis not present

## 2017-07-01 NOTE — Patient Instructions (Signed)

## 2017-07-01 NOTE — Progress Notes (Signed)
Patient ID: [name], female   DOB: 09/05/1946, 71 y.o.   MRN: [id number] Patient presents for diabetic shoe pick up, shoes are tried on for good fit.  Patient received 1 Pair and 3 pairs custom molded diabetic inserts.  Verbal and written break in and wear instructions given.  Patient will follow up for scheduled routine care.

## 2017-07-09 NOTE — Progress Notes (Signed)
Patient ID: [name], female   DOB: 1946/04/14, 71 y.o.   MRN: [id number]   HPI: 71-year-old female with a history of diabetes mellitus presents today for swelling and bruising to the lateral part of the foot. She states that is very painful to walk and she denies injury. Walking is aggravating and alleviated by rest and elevation. This is been ongoing for the past 3 weeks.  Past Medical History:  Diagnosis Date  . Amnesia   . Anxiety   . Arthritis   . Back pain   . CHF (congestive heart failure) ([place])   . Chronic pain syndrome   . Cystocele   . Depression   . Diabetes mellitus without complication ([place])   . GERD (gastroesophageal reflux disease)   . Hyperlipidemia   . Hypertension   . IBS (irritable bowel syndrome)   . Myocardial infarction ([place])   . Obesity   . Osteoporosis   . Osteoporosis   . Shortness of breath dyspnea   . Sleep apnea    not using now  . Thyroid nodule      Physical Exam: General: The patient is alert and oriented x3 in no acute distress.  Dermatology: Skin is warm, dry and supple bilateral lower extremities. Negative for open lesions or macerations.  Vascular: Palpable pedal pulses bilaterally. Significant edema noted to the right lower extremity. Edema is pitting.  Neurological: Neurovascular status intact. Epicritic and protective threshold diminished bilaterally.   Musculoskeletal Exam: Range of motion within normal limits to all pedal and ankle joints bilateral. Muscle strength 5/5 in all groups bilateral.   Radiographic Exam:  Normal osseous mineralization. Joint spaces preserved. No fracture/dislocation/boney destruction.    Assessment: -  Right lower extremity edema   Plan of Care:  - Patient was evaluated today. X-rays reviewed. - Today were going to place referral for a vascular consult. - Compression anklet was dispensed. -  Return to clinic when necessary   Edrick Kins, DPM [facility]  Dr. Edrick Kins,  DPM    [address], [place] [address]                Office [phone]  Fax [phone]

## 2017-07-10 ENCOUNTER — Telehealth: Payer: Self-pay

## 2017-07-10 NOTE — Telephone Encounter (Signed)
-----   Message from Edrick Kins, DPM sent at 07/09/2017  7:25 PM EDT ----- Regarding: Vascular consult Not sure if we placed a vascular consult for this patient. If not please arrange vascular consult.  Diagnosis: Right lower extremity edema 3 weeks  Thanks, Dr. Amalia Hailey

## 2017-07-10 NOTE — Telephone Encounter (Signed)
Patient has been notified of appt with Vascular surgeon.

## 2017-07-10 NOTE — Addendum Note (Signed)
Addended by: Graceann Congress D on: 07/10/2017 12:03 PM   Modules accepted: Orders

## 2017-07-10 NOTE — Telephone Encounter (Signed)
Patient has been scheduled with Marcelle Overlie, PA-C at [facility] on Friday 07/18/17 @ 2pm   Patient has been placed on cancellation list.   I tried to reach patient to inform of appt, but no answer on home phone and voice mail  Not set up yet on cell #

## 2017-07-18 ENCOUNTER — Encounter (INDEPENDENT_AMBULATORY_CARE_PROVIDER_SITE_OTHER): Payer: Medicare Other | Admitting: Vascular Surgery

## 2017-07-25 ENCOUNTER — Encounter (INDEPENDENT_AMBULATORY_CARE_PROVIDER_SITE_OTHER): Payer: Medicare Other | Admitting: Vascular Surgery

## 2017-10-31 ENCOUNTER — Ambulatory Visit: Payer: Medicare Other | Admitting: Podiatry

## 2017-11-07 ENCOUNTER — Ambulatory Visit (INDEPENDENT_AMBULATORY_CARE_PROVIDER_SITE_OTHER): Payer: Medicare Other | Admitting: Podiatry

## 2017-11-07 ENCOUNTER — Encounter: Payer: Self-pay | Admitting: Podiatry

## 2017-11-07 DIAGNOSIS — E0842 Diabetes mellitus due to underlying condition with diabetic polyneuropathy: Secondary | ICD-10-CM | POA: Diagnosis not present

## 2017-11-07 DIAGNOSIS — L97522 Non-pressure chronic ulcer of other part of left foot with fat layer exposed: Secondary | ICD-10-CM

## 2017-11-07 DIAGNOSIS — L97512 Non-pressure chronic ulcer of other part of right foot with fat layer exposed: Secondary | ICD-10-CM

## 2017-11-07 DIAGNOSIS — I70245 Atherosclerosis of native arteries of left leg with ulceration of other part of foot: Secondary | ICD-10-CM

## 2017-11-07 DIAGNOSIS — I70238 Atherosclerosis of native arteries of right leg with ulceration of other part of lower right leg: Secondary | ICD-10-CM

## 2017-11-09 NOTE — Progress Notes (Signed)
   Subjective:  72 year old female with a history of diabetes mellitus presents today for evaluation ulcerations to the third toes of bilateral feet that have been present for more than a week.  She reports associated pain to the lesion on the right foot.  She was evaluated by her PCP and prescribed Keflex which she has been taking as directed since yesterday.  Patient presents today for further treatment and evaluation.   Past Medical History:  Diagnosis Date  . Amnesia   . Anxiety   . Arthritis   . Back pain   . CHF (congestive heart failure) ([place])   . Chronic pain syndrome   . Cystocele   . Depression   . Diabetes mellitus without complication ([place])   . GERD (gastroesophageal reflux disease)   . Hyperlipidemia   . Hypertension   . IBS (irritable bowel syndrome)   . Myocardial infarction ([place])   . Obesity   . Osteoporosis   . Osteoporosis   . Shortness of breath dyspnea   . Sleep apnea    not using now  . Thyroid nodule       Objective/Physical Exam General: The patient is alert and oriented x3 in no acute distress.  Dermatology:  Wound #1 noted to the third toe of the right foot measuring 0.5 x 0.5 x 0.1 cm (LxWxD).   Wound #2 noted to the third toe of the left foot measuring 0.5 x 0.5 x 0.1 cm  To the noted ulceration(s), there is no eschar. There is a moderate amount of slough, fibrin, and necrotic tissue noted. Granulation tissue and wound base is red. There is a minimal amount of serosanguineous drainage noted. There is no exposed bone muscle-tendon ligament or joint. There is no malodor. Periwound integrity is intact. Skin is warm, dry and supple bilateral lower extremities.  Vascular: Palpable pedal pulses bilaterally. No edema or erythema noted. Capillary refill within normal limits.  Neurological: Epicritic and protective threshold absent bilaterally.   Musculoskeletal Exam: Range of motion within normal limits to all pedal and ankle joints bilateral. Muscle  strength 5/5 in all groups bilateral.   Assessment: #1 ulcerations of bilateral third toes secondary to diabetes mellitus #2 diabetes mellitus w/ peripheral neuropathy   Plan of Care:  #1 Patient was evaluated. #2 medically necessary excisional debridement including subcutaneous tissue was performed using a tissue nipper and a chisel blade. Excisional debridement of all the necrotic nonviable tissue down to healthy bleeding viable tissue was performed with post-debridement measurements same as pre-. #3 the wound was cleansed and dry sterile dressing applied. #4 recommended antibiotic ointment for daily use with a Band-Aid. #5 recommended below knee compression sleeves. #6 Continue taking Keflex as directed by PCP.  #7 Return to clinic in 3 weeks.    Edrick Kins, DPM [facility]  Dr. Edrick Kins, [address]                                        [place], [place] [address]                Office [phone]  Fax [phone]

## 2017-11-28 ENCOUNTER — Ambulatory Visit: Payer: Medicare Other | Admitting: Podiatry

## 2018-03-10 ENCOUNTER — Encounter: Payer: Self-pay | Admitting: Podiatry

## 2018-03-10 ENCOUNTER — Ambulatory Visit (INDEPENDENT_AMBULATORY_CARE_PROVIDER_SITE_OTHER): Payer: Medicare Other | Admitting: Podiatry

## 2018-03-10 DIAGNOSIS — E0842 Diabetes mellitus due to underlying condition with diabetic polyneuropathy: Secondary | ICD-10-CM

## 2018-03-10 DIAGNOSIS — L97512 Non-pressure chronic ulcer of other part of right foot with fat layer exposed: Secondary | ICD-10-CM | POA: Diagnosis not present

## 2018-03-10 DIAGNOSIS — I70245 Atherosclerosis of native arteries of left leg with ulceration of other part of foot: Secondary | ICD-10-CM

## 2018-03-12 NOTE — Progress Notes (Signed)
   Subjective: 72 year old female with PMHx of DM presenting today for follow up evaluation of an ulceration noted to the 3rd toe of the right foot. She reports severe soreness of the toe when walking. She has been applying antibiotic ointment daily with a Band-Aid. Patient is here for further evaluation and treatment.   Past Medical History:  Diagnosis Date  . Amnesia   . Anxiety   . Arthritis   . Back pain   . CHF (congestive heart failure) ([place])   . Chronic pain syndrome   . Cystocele   . Depression   . Diabetes mellitus without complication ([place])   . GERD (gastroesophageal reflux disease)   . Hyperlipidemia   . Hypertension   . IBS (irritable bowel syndrome)   . Myocardial infarction ([place])   . Obesity   . Osteoporosis   . Osteoporosis   . Shortness of breath dyspnea   . Sleep apnea    not using now  . Thyroid nodule       Objective/Physical Exam General: The patient is alert and oriented x3 in no acute distress.  Dermatology:  Wound #1 noted to the right 3rd toe measuring 0.5 x 0.8 x 0.1 cm (LxWxD).   To the noted ulceration(s), there is no eschar. There is a moderate amount of slough, fibrin, and necrotic tissue noted. Granulation tissue and wound base is red. There is a minimal amount of serosanguineous drainage noted. There is no exposed bone muscle-tendon ligament or joint. There is no malodor. Periwound integrity is intact. Skin is warm, dry and supple bilateral lower extremities.  Vascular: Palpable pedal pulses bilaterally. No edema or erythema noted. Capillary refill within normal limits.  Neurological: Epicritic and protective threshold diminished bilaterally.   Musculoskeletal Exam: Range of motion within normal limits to all pedal and ankle joints bilateral. Muscle strength 5/5 in all groups bilateral.   Assessment: #1 ulceration of right third toe secondary to diabetes mellitus #2 diabetes mellitus w/ peripheral neuropathy   Plan of Care:  #1 Patient  was evaluated. #2 medically necessary excisional debridement including subcutaneous tissue was performed using a tissue nipper and a chisel blade. Excisional debridement of all the necrotic nonviable tissue down to healthy bleeding viable tissue was performed with post-debridement measurements same as pre-. #3 the wound was cleansed and dry sterile dressing applied. #4 Continue antibiotic ointment daily with a Band-Aid.  #5 toe crest pad dispensed.  #6 patient is to return to clinic in 4 weeks.   Edrick Kins, DPM [facility]  Dr. Edrick Kins, [address]                                        [place], [place] [address]                Office [phone]  Fax [phone]

## 2018-04-07 ENCOUNTER — Ambulatory Visit: Payer: Medicare Other | Admitting: Podiatry

## 2018-04-08 DIAGNOSIS — Z01810 Encounter for preprocedural cardiovascular examination: Secondary | ICD-10-CM | POA: Insufficient documentation

## 2018-04-08 DIAGNOSIS — Z13820 Encounter for screening for osteoporosis: Secondary | ICD-10-CM | POA: Insufficient documentation

## 2018-05-05 ENCOUNTER — Encounter: Payer: Self-pay | Admitting: *Deleted

## 2018-05-05 ENCOUNTER — Other Ambulatory Visit: Payer: Self-pay

## 2018-05-05 ENCOUNTER — Observation Stay
Admission: RE | Admit: 2018-05-05 | Discharge: 2018-05-06 | Disposition: A | Discharge status: Home / Self Care | Payer: Medicare Other | Source: Ambulatory Visit | Attending: Cardiology | Admitting: Cardiology

## 2018-05-05 ENCOUNTER — Encounter
Admission: RE | Disposition: A | Discharge status: Home / Self Care | Payer: Self-pay | Source: Ambulatory Visit | Attending: Cardiology

## 2018-05-05 DIAGNOSIS — I2510 Atherosclerotic heart disease of native coronary artery without angina pectoris: Principal | ICD-10-CM | POA: Diagnosis present

## 2018-05-05 DIAGNOSIS — R9430 Abnormal result of cardiovascular function study, unspecified: Secondary | ICD-10-CM | POA: Diagnosis not present

## 2018-05-05 HISTORY — PX: CORONARY STENT INTERVENTION: CATH118234

## 2018-05-05 HISTORY — PX: LEFT HEART CATH AND CORONARY ANGIOGRAPHY: CATH118249

## 2018-05-05 LAB — POCT ACTIVATED CLOTTING TIME: Activated Clotting Time: 428 seconds

## 2018-05-05 LAB — GLUCOSE, CAPILLARY
Glucose-Capillary: 89 mg/dL (ref 70–99)
Glucose-Capillary: 78 mg/dL (ref 70–99)
Glucose-Capillary: 74 mg/dL (ref 70–99)

## 2018-05-05 SURGERY — LEFT HEART CATH AND CORONARY ANGIOGRAPHY
Anesthesia: Moderate Sedation

## 2018-05-05 MED ORDER — ACETAMINOPHEN 325 MG PO TABS
ORAL_TABLET | ORAL | Status: AC
  Filled 2018-05-05: qty 2

## 2018-05-05 MED ORDER — BIVALIRUDIN TRIFLUOROACETATE 250 MG IV SOLR
INTRAVENOUS | Status: AC
  Filled 2018-05-05: qty 250

## 2018-05-05 MED ORDER — CLOPIDOGREL BISULFATE 75 MG PO TABS
ORAL_TABLET | ORAL | Status: AC
  Filled 2018-05-05: qty 8

## 2018-05-05 MED ORDER — METOPROLOL SUCCINATE ER 50 MG PO TB24
25.0000 mg | ORAL_TABLET | Freq: Every day | ORAL | Status: DC
  Administered 2018-05-05 – 2018-05-06 (×2): 25 mg via ORAL
  Filled 2018-05-05 (×3): qty 1

## 2018-05-05 MED ORDER — SIMVASTATIN 40 MG PO TABS
40.0000 mg | ORAL_TABLET | Freq: Every day | ORAL | Status: DC
  Administered 2018-05-05: 40 mg via ORAL
  Filled 2018-05-05: qty 2
  Filled 2018-05-05: qty 1
  Filled 2018-05-05: qty 2
  Filled 2018-05-05: qty 1

## 2018-05-05 MED ORDER — DIPHENHYDRAMINE HCL 25 MG PO CAPS
25.0000 mg | ORAL_CAPSULE | ORAL | Status: DC | PRN
  Administered 2018-05-05 – 2018-05-06 (×3): 25 mg via ORAL
  Filled 2018-05-05 (×4): qty 1

## 2018-05-05 MED ORDER — SODIUM CHLORIDE 0.9% FLUSH: 3.0000 mL | INTRAVENOUS | Status: DC | PRN

## 2018-05-05 MED ORDER — HEPARIN SODIUM (PORCINE) 1000 UNIT/ML IJ SOLN
INTRAMUSCULAR | Status: DC | PRN
  Administered 2018-05-05: 7000 [IU] via INTRAVENOUS

## 2018-05-05 MED ORDER — ASPIRIN 81 MG PO CHEW
CHEWABLE_TABLET | ORAL | Status: AC
  Filled 2018-05-05: qty 1

## 2018-05-05 MED ORDER — HEPARIN SODIUM (PORCINE) 1000 UNIT/ML IJ SOLN
INTRAMUSCULAR | Status: AC
  Filled 2018-05-05: qty 1

## 2018-05-05 MED ORDER — METOPROLOL TARTRATE 5 MG/5ML IV SOLN
INTRAVENOUS | Status: DC | PRN
  Administered 2018-05-05: 5 mg via INTRAVENOUS

## 2018-05-05 MED ORDER — MIDAZOLAM HCL 2 MG/ML PO SYRP
ORAL_SOLUTION | ORAL | Status: AC
  Filled 2018-05-05: qty 4

## 2018-05-05 MED ORDER — LIDOCAINE HCL (PF) 1 % IJ SOLN
INTRAMUSCULAR | Status: AC
  Filled 2018-05-05: qty 30

## 2018-05-05 MED ORDER — SODIUM CHLORIDE 0.9 % WEIGHT BASED INFUSION
1.0000 mL/kg/h | INTRAVENOUS | Status: AC
  Administered 2018-05-05 (×2): 1 mL/kg/h via INTRAVENOUS

## 2018-05-05 MED ORDER — HYDROCODONE-ACETAMINOPHEN 5-325 MG PO TABS
1.0000 | ORAL_TABLET | ORAL | Status: DC | PRN
  Administered 2018-05-05 (×2): 1 via ORAL

## 2018-05-05 MED ORDER — ASPIRIN 81 MG PO CHEW
CHEWABLE_TABLET | ORAL | Status: AC
Start: 2018-05-05 — End: ?
  Filled 2018-05-05: qty 3

## 2018-05-05 MED ORDER — OXYCODONE HCL 5 MG PO TABS
5.0000 mg | ORAL_TABLET | ORAL | Status: DC | PRN
  Administered 2018-05-05 – 2018-05-06 (×3): 5 mg via ORAL
  Filled 2018-05-05 (×4): qty 1

## 2018-05-05 MED ORDER — SODIUM CHLORIDE 0.9 % IV SOLN: 250.0000 mL | INTRAVENOUS | Status: DC | PRN

## 2018-05-05 MED ORDER — MIDAZOLAM HCL 2 MG/2ML IJ SOLN
INTRAMUSCULAR | Status: DC | PRN
  Administered 2018-05-05 (×2): 1 mg via INTRAVENOUS

## 2018-05-05 MED ORDER — SODIUM CHLORIDE 0.9 % IV SOLN
INTRAVENOUS | Status: AC | PRN
  Administered 2018-05-05: 1.75 mg/kg/h via INTRAVENOUS

## 2018-05-05 MED ORDER — FENTANYL CITRATE (PF) 100 MCG/2ML IJ SOLN
INTRAMUSCULAR | Status: AC
  Filled 2018-05-05: qty 2

## 2018-05-05 MED ORDER — BIVALIRUDIN BOLUS VIA INFUSION - CUPID
INTRAVENOUS | Status: DC | PRN
  Administered 2018-05-05: 98.325 mg via INTRAVENOUS

## 2018-05-05 MED ORDER — HEPARIN (PORCINE) IN NACL 1000-0.9 UT/500ML-% IV SOLN
INTRAVENOUS | Status: AC
  Filled 2018-05-05: qty 500

## 2018-05-05 MED ORDER — CLOPIDOGREL BISULFATE 75 MG PO TABS
75.0000 mg | ORAL_TABLET | Freq: Every day | ORAL | Status: DC
  Administered 2018-05-06: 75 mg via ORAL
  Filled 2018-05-05: qty 1

## 2018-05-05 MED ORDER — ASPIRIN 81 MG PO CHEW
CHEWABLE_TABLET | ORAL | Status: DC | PRN
  Administered 2018-05-05: 243 mg via ORAL

## 2018-05-05 MED ORDER — INSULIN GLARGINE 100 UNIT/ML ~~LOC~~ SOLN
70.0000 [IU] | Freq: Every day | SUBCUTANEOUS | Status: DC
  Filled 2018-05-05: qty 0.7

## 2018-05-05 MED ORDER — PIOGLITAZONE HCL 30 MG PO TABS
45.0000 mg | ORAL_TABLET | Freq: Every day | ORAL | Status: DC
  Administered 2018-05-06: 45 mg via ORAL
  Filled 2018-05-05: qty 1

## 2018-05-05 MED ORDER — ASPIRIN 81 MG PO CHEW
81.0000 mg | CHEWABLE_TABLET | ORAL | Status: AC
  Administered 2018-05-05: 81 mg via ORAL

## 2018-05-05 MED ORDER — MIDAZOLAM HCL 2 MG/2ML IJ SOLN
INTRAMUSCULAR | Status: AC
  Filled 2018-05-05: qty 2

## 2018-05-05 MED ORDER — BUSPIRONE HCL 15 MG PO TABS
15.0000 mg | ORAL_TABLET | Freq: Two times a day (BID) | ORAL | Status: DC
  Administered 2018-05-05 – 2018-05-06 (×2): 15 mg via ORAL
  Filled 2018-05-05 (×4): qty 1

## 2018-05-05 MED ORDER — LEVOTHYROXINE SODIUM 150 MCG PO TABS
150.0000 ug | ORAL_TABLET | Freq: Every day | ORAL | Status: DC
  Administered 2018-05-06: 150 ug via ORAL
  Filled 2018-05-05: qty 3
  Filled 2018-05-05: qty 1

## 2018-05-05 MED ORDER — SODIUM CHLORIDE 0.9% FLUSH: 3.0000 mL | Freq: Two times a day (BID) | INTRAVENOUS | Status: DC

## 2018-05-05 MED ORDER — HYDROCODONE-ACETAMINOPHEN 5-325 MG PO TABS
ORAL_TABLET | ORAL | Status: AC
  Filled 2018-05-05: qty 1

## 2018-05-05 MED ORDER — METOPROLOL TARTRATE 5 MG/5ML IV SOLN
INTRAVENOUS | Status: AC
Start: 2018-05-05 — End: ?
  Filled 2018-05-05: qty 5

## 2018-05-05 MED ORDER — ASPIRIN 81 MG PO CHEW
81.0000 mg | CHEWABLE_TABLET | Freq: Every day | ORAL | Status: DC
  Administered 2018-05-06: 81 mg via ORAL
  Filled 2018-05-05: qty 1

## 2018-05-05 MED ORDER — GABAPENTIN 600 MG PO TABS
300.0000 mg | ORAL_TABLET | Freq: Every day | ORAL | Status: DC
  Administered 2018-05-05: 300 mg via ORAL
  Filled 2018-05-05: qty 1
  Filled 2018-05-05: qty 0.5

## 2018-05-05 MED ORDER — SODIUM CHLORIDE 0.9% FLUSH
3.0000 mL | Freq: Two times a day (BID) | INTRAVENOUS | Status: DC
  Administered 2018-05-05 – 2018-05-06 (×2): 3 mL via INTRAVENOUS

## 2018-05-05 MED ORDER — ONDANSETRON HCL 4 MG/2ML IJ SOLN: 4.0000 mg | Freq: Four times a day (QID) | INTRAMUSCULAR | Status: DC | PRN

## 2018-05-05 MED ORDER — IOPAMIDOL (ISOVUE-300) INJECTION 61%
INTRAVENOUS | Status: DC | PRN
  Administered 2018-05-05: 290 mL via INTRA_ARTERIAL

## 2018-05-05 MED ORDER — HYDRALAZINE HCL 20 MG/ML IJ SOLN: 5.0000 mg | INTRAMUSCULAR | Status: AC | PRN

## 2018-05-05 MED ORDER — ACETAMINOPHEN 325 MG PO TABS
650.0000 mg | ORAL_TABLET | ORAL | Status: DC | PRN
  Administered 2018-05-05: 650 mg via ORAL

## 2018-05-05 MED ORDER — LABETALOL HCL 5 MG/ML IV SOLN
INTRAVENOUS | Status: AC
  Filled 2018-05-05: qty 4

## 2018-05-05 MED ORDER — FENTANYL CITRATE (PF) 100 MCG/2ML IJ SOLN
INTRAMUSCULAR | Status: DC | PRN
  Administered 2018-05-05 (×2): 25 ug via INTRAVENOUS

## 2018-05-05 MED ORDER — MIDAZOLAM HCL 2 MG/ML PO SYRP
4.0000 mg | ORAL_SOLUTION | Freq: Once | ORAL | Status: AC
  Administered 2018-05-05: 4 mg via ORAL

## 2018-05-05 MED ORDER — SODIUM CHLORIDE 0.9 % WEIGHT BASED INFUSION
3.0000 mL/kg/h | INTRAVENOUS | Status: AC
  Administered 2018-05-05: 3 mL/kg/h via INTRAVENOUS

## 2018-05-05 MED ORDER — SODIUM CHLORIDE 0.9 % WEIGHT BASED INFUSION: 1.0000 mL/kg/h | INTRAVENOUS | Status: DC

## 2018-05-05 MED ORDER — VERAPAMIL HCL 2.5 MG/ML IV SOLN
INTRAVENOUS | Status: AC
  Filled 2018-05-05: qty 2

## 2018-05-05 MED ORDER — VERAPAMIL HCL 2.5 MG/ML IV SOLN
INTRAVENOUS | Status: DC | PRN
  Administered 2018-05-05: 2.5 mg via INTRA_ARTERIAL

## 2018-05-05 MED ORDER — INSULIN GLARGINE 100 UNIT/ML ~~LOC~~ SOLN
75.0000 [IU] | Freq: Every day | SUBCUTANEOUS | Status: DC
  Administered 2018-05-05: 75 [IU] via SUBCUTANEOUS
  Filled 2018-05-05 (×2): qty 0.75

## 2018-05-05 MED ORDER — CLOPIDOGREL BISULFATE 75 MG PO TABS
ORAL_TABLET | ORAL | Status: DC | PRN
  Administered 2018-05-05: 600 mg via ORAL

## 2018-05-05 MED ORDER — LABETALOL HCL 5 MG/ML IV SOLN
10.0000 mg | INTRAVENOUS | Status: AC | PRN
  Administered 2018-05-05 (×2): 10 mg via INTRAVENOUS

## 2018-05-05 SURGICAL SUPPLY — 29 items
BALLN TREK RX 2.5X15 (BALLOONS) ×3
BALLN ~~LOC~~ TREK RX 4.0X12 (BALLOONS) ×3
BALLOON TREK RX 2.5X15 (BALLOONS) IMPLANT
BALLOON ~~LOC~~ TREK RX 4.0X12 (BALLOONS) IMPLANT
CANNULA 5F STIFF (CANNULA) ×1 IMPLANT
CATH INFINITI 5 FR 3DRC (CATHETERS) ×1 IMPLANT
CATH INFINITI 5FR ANG PIGTAIL (CATHETERS) ×2 IMPLANT
CATH INFINITI 5FR TG (CATHETERS) ×1 IMPLANT
CATH INFINITI JR4 5F (CATHETERS) ×1 IMPLANT
CATH LAUNCHER 6FR EBU 3 (CATHETERS) ×1 IMPLANT
CATH VISTA GUIDE 6FR XB3 SH (CATHETERS) ×1 IMPLANT
CATH VISTA GUIDE 6FR XB3.5 (CATHETERS) ×1 IMPLANT
DEVICE CLOSURE MYNXGRIP 6/7F (Vascular Products) ×1 IMPLANT
DEVICE INFLAT 30 PLUS (MISCELLANEOUS) ×1 IMPLANT
DEVICE RAD TR BAND REGULAR (VASCULAR PRODUCTS) ×1 IMPLANT
KIT MANI 3VAL PERCEP (MISCELLANEOUS) ×3 IMPLANT
NDL PERC 18GX7CM (NEEDLE) IMPLANT
NDL PERC 21GX4CM (NEEDLE) IMPLANT
NEEDLE PERC 18GX7CM (NEEDLE) ×6 IMPLANT
NEEDLE PERC 21GX4CM (NEEDLE) ×3 IMPLANT
NEEDLE SMART 18G ACCESS (NEEDLE) IMPLANT
PACK CARDIAC CATH (CUSTOM PROCEDURE TRAY) ×3 IMPLANT
SHEATH AVANTI 6FR X 11CM (SHEATH) ×1 IMPLANT
SHEATH RAIN RADIAL 21G 6FR (SHEATH) ×1 IMPLANT
STENT SIERRA 3.50 X 18 MM (Permanent Stent) ×1 IMPLANT
WIRE ASAHI PROWATER 180CM (WIRE) ×1 IMPLANT
WIRE GUIDERIGHT .035X150 (WIRE) ×2 IMPLANT
WIRE HITORQ VERSACORE ST 145CM (WIRE) ×1 IMPLANT
WIRE ROSEN-J .035X260CM (WIRE) ×2 IMPLANT

## 2018-05-05 NOTE — Plan of Care (Signed)
Encourage patient to utilize bedside commode after bedrest is completed.

## 2018-05-05 NOTE — Progress Notes (Addendum)
Reported from Cath Lab staff oozing from skin folds in right groin, site CDI at this time with redness in bilateral folds will monitor closely.

## 2018-05-05 NOTE — Progress Notes (Signed)
Pt is admitted to room 242. Pt has 3 procedure sites which include the right radial, left and right femoral. All site dressings are clean dry and intact. Pt is complaining of chronic back pain not relieved by prn medication. MD notified for change to dose and medication taken at home for pain. MD acknowledged request and oxycodone, benadryl and increase in lantus will be added. I will continue to assess.

## 2018-05-05 NOTE — Progress Notes (Addendum)
Per Dr. Saralyn Pilar via [place], to start deflation of TR band at 1400 and stop Angiomax gtt at 1710. TR band placed at 1107. Reported per [facility] tech that TR band deflation attempted in Cath Lab during procedure with some oozing at 1300. Will start deflation slow and monitor closely.

## 2018-05-05 NOTE — Progress Notes (Addendum)
MD paged and updated on Pt back pain. Vicodin ordered PRN. Pt states she sees pain clinic and refused Vicodin for now agrees to tylenol PO. Right groin with serosanguinous drainage, dressing removed to ensure bleeding source not arterial sticks. New gauze dressing placed to site.

## 2018-05-06 ENCOUNTER — Encounter: Payer: Self-pay | Admitting: Cardiology

## 2018-05-06 DIAGNOSIS — I2510 Atherosclerotic heart disease of native coronary artery without angina pectoris: Secondary | ICD-10-CM | POA: Diagnosis not present

## 2018-05-06 LAB — CBC
HCT: 34.2 % — ABNORMAL LOW (ref 35.0–47.0)
Hemoglobin: 11.4 g/dL — ABNORMAL LOW (ref 12.0–16.0)
MCH: 29.6 pg (ref 26.0–34.0)
MCHC: 33.4 g/dL (ref 32.0–36.0)
MCV: 88.7 fL (ref 80.0–100.0)
Platelets: 112 10*3/uL — ABNORMAL LOW (ref 150–440)
RBC: 3.86 MIL/uL (ref 3.80–5.20)
RDW: 15.4 % — ABNORMAL HIGH (ref 11.5–14.5)
WBC: 6 10*3/uL (ref 3.6–11.0)

## 2018-05-06 LAB — BASIC METABOLIC PANEL
Anion gap: 7 (ref 5–15)
BUN: 13 mg/dL (ref 8–23)
CO2: 24 mmol/L (ref 22–32)
Calcium: 8.5 mg/dL — ABNORMAL LOW (ref 8.9–10.3)
Chloride: 109 mmol/L (ref 98–111)
Creatinine, Ser: 0.61 mg/dL (ref 0.44–1.00)
GFR calc Af Amer: >60 mL/min (ref >60)
GFR calc non Af Amer: >60 mL/min (ref >60)
Glucose, Bld: 119 mg/dL — ABNORMAL HIGH (ref 70–99)
Potassium: 3.4 mmol/L — ABNORMAL LOW (ref 3.5–5.1)
Sodium: 140 mmol/L (ref 135–145)

## 2018-05-06 MED ORDER — CLOPIDOGREL BISULFATE 75 MG PO TABS: 75.0000 mg | ORAL_TABLET | Freq: Every day | ORAL | 11 refills | Status: AC

## 2018-05-06 NOTE — Progress Notes (Signed)
Pt discharged to home via wc.  Instructions and rx given to pt.  Questions answered.  No distress.  

## 2018-05-06 NOTE — Care Management Note (Signed)
Case Management Note  Patient Details  Name: YARELLI DECELLES MRN: [id number] Date of Birth: Aug 07, 1946  Subjective/Objective:      Admitted to [facility] under observation status with the diagnosis of coronary artery disease. Lives with friend and grandson. Yolanda Bonine is [employer] [phone]). Prescriptions are filled at [facility] in [place]. No home Health. No skilled facility. No home oxygen. Rolling walker and cane in the home. Last fall was 5-6 months ago. Takes care of all basic activities of daily living herself. Family helps with errands. Family/friend will transport. Discharge to home today per Dr. Saralyn Pilar.              Action/Plan: No discharge needs identified   Expected Discharge Date:  05/06/18               Expected Discharge Plan:     In-House Referral:     Discharge planning Services     Post Acute Care Choice:    Choice offered to:     DME Arranged:    DME Agency:     HH Arranged:    HH Agency:     Status of Service:     If discussed at [employer], dates discussed:    Additional Comments:  Shelbie Ammons, RN MSN CCM Care Management [phone] 05/06/2018, 8:42 AM

## 2018-05-06 NOTE — Discharge Summary (Signed)
   Physician Discharge Summary  Patient ID: [name] MRN: [id number] DOB/AGE: May 09, 1946 72 y.o.  Admit date: 05/05/2018 Discharge date: 05/06/2018  Primary Discharge Diagnosis Chest pain Secondary Discharge Diagnosis coronary artery disease  Significant Diagnostic Studies: yes  Consults: None  Hospital Course: The patient underwent elective cardiac catheterization on 05/05/2018 which revealed 90% stenosis proximal LAD with normal left ventricular function.  The patient underwent successful PCI, receiving DES to proximal LAD with an excellent angiographic result.  She had an uncomplicated hospital course, without recurrent chest pain.  Charge home in stable condition with follow-up in 1 week.   Discharge Exam: Blood pressure (!) 146/65, pulse 80, temperature 98 F (36.7 C), temperature source Oral, resp. rate 16, height 5\' 4"  (1.626 m), weight 135.6 kg (298 lb 14.4 oz), SpO2 96 %.   General appearance: alert Head: Normocephalic, without obvious abnormality, atraumatic Eyes: conjunctivae/corneas clear. PERRL, EOM's intact. Fundi benign. Ears: normal TM's and external ear canals both ears Nose: Nares normal. Septum midline. Mucosa normal. No drainage or sinus tenderness. Throat: lips, mucosa, and tongue normal; teeth and gums normal Neck: no adenopathy, no carotid bruit, no JVD, supple, symmetrical, trachea midline and thyroid not enlarged, symmetric, no tenderness/mass/nodules Back: symmetric, no curvature. ROM normal. No CVA tenderness. Resp: clear to auscultation bilaterally Chest wall: no tenderness Cardio: regular rate and rhythm, S1, S2 normal, no murmur, click, rub or gallop GI: soft, non-tender; bowel sounds normal; no masses,  no organomegaly Extremities: extremities normal, atraumatic, no cyanosis or edema Pulses: 2+ and symmetric Skin: Skin color, texture, turgor normal. No rashes or lesions Lymph nodes: Cervical, supraclavicular, and axillary nodes normal. Neurologic:  Grossly normal Labs:   Lab Results  Component Value Date   WBC 6.0 05/06/2018   HGB 11.4 (L) 05/06/2018   HCT 34.2 (L) 05/06/2018   MCV 88.7 05/06/2018   PLT 112 (L) 05/06/2018    Recent Labs  Lab 05/06/18 0441  NA 140  K 3.4*  CL 109  CO2 24  BUN 13  CREATININE 0.61  CALCIUM 8.5*  GLUCOSE 119*      Radiology:  EKG: Normal sinus rhythm  FOLLOW UP PLANS AND APPOINTMENTS Discharge Instructions    AMB Referral to Cardiac Rehabilitation - Phase II   Complete by:  As directed    Diagnosis:  Coronary Stents      Follow-up Information    Emonee Winkowski, MD Follow up in 1 week(s).   Specialty:  Cardiology Contact information: [facility] [place] [address] [phone]           BRING ALL MEDICATIONS WITH YOU TO FOLLOW UP APPOINTMENTS  Time spent with patient to include physician time: 25 minutes Signed:  Isaias Cowman MD, PhD, [facility] 05/06/2018, 7:49 AM

## 2018-05-06 NOTE — Care Management Obs Status (Signed)
[place] NOTIFICATION   Patient Details  Name: ANNARAE MACNAIR MRN: [id number] Date of Birth: [birth year]   Medicare Observation Status Notification Given:  Yes    Shelbie Ammons, RN 05/06/2018, 8:28 AM

## 2018-08-25 ENCOUNTER — Ambulatory Visit: Payer: Medicare Other | Admitting: Urology

## 2018-09-15 ENCOUNTER — Ambulatory Visit: Payer: Medicare Other | Admitting: Urology

## 2018-11-16 ENCOUNTER — Encounter: Payer: Self-pay | Admitting: Urology

## 2018-11-16 ENCOUNTER — Ambulatory Visit: Payer: Medicare Other | Admitting: Urology

## 2019-06-23 ENCOUNTER — Other Ambulatory Visit: Payer: Self-pay | Admitting: Rehabilitation

## 2019-06-23 DIAGNOSIS — M545 Low back pain, unspecified: Secondary | ICD-10-CM

## 2019-06-29 ENCOUNTER — Other Ambulatory Visit: Payer: Medicare Other

## 2019-07-20 ENCOUNTER — Ambulatory Visit
Admission: RE | Admit: 2019-07-20 | Discharge: 2019-07-20 | Disposition: A | Discharge status: Home / Self Care | Payer: Medicare Other | Source: Ambulatory Visit | Attending: Rehabilitation | Admitting: Rehabilitation

## 2019-07-20 DIAGNOSIS — M545 Low back pain, unspecified: Secondary | ICD-10-CM

## 2019-07-20 MED ORDER — GADOBENATE DIMEGLUMINE 529 MG/ML IV SOLN
20.0000 mL | Freq: Once | INTRAVENOUS | Status: AC | PRN
  Administered 2019-07-20: 20 mL via INTRAVENOUS

## 2019-07-26 ENCOUNTER — Encounter: Payer: Self-pay | Admitting: Urology

## 2019-07-26 ENCOUNTER — Ambulatory Visit: Payer: Medicare Other | Admitting: Urology

## 2019-10-08 ENCOUNTER — Emergency Department
Admission: EM | Admit: 2019-10-08 | Discharge: 2019-10-08 | Disposition: A | Discharge status: Home / Self Care | Payer: Medicare Other | Attending: Emergency Medicine | Admitting: Emergency Medicine

## 2019-10-08 ENCOUNTER — Other Ambulatory Visit: Payer: Self-pay

## 2019-10-08 ENCOUNTER — Encounter: Payer: Self-pay | Admitting: Emergency Medicine

## 2019-10-08 DIAGNOSIS — Z5321 Procedure and treatment not carried out due to patient leaving prior to being seen by health care provider: Secondary | ICD-10-CM | POA: Insufficient documentation

## 2019-10-08 DIAGNOSIS — R531 Weakness: Secondary | ICD-10-CM | POA: Diagnosis present

## 2019-10-08 LAB — CBC
HCT: 37.7 % (ref 36.0–46.0)
Hemoglobin: 11.9 g/dL — ABNORMAL LOW (ref 12.0–15.0)
MCH: 28.5 pg (ref 26.0–34.0)
MCHC: 31.6 g/dL (ref 30.0–36.0)
MCV: 90.4 fL (ref 80.0–100.0)
Platelets: 164 10*3/uL (ref 150–400)
RBC: 4.17 MIL/uL (ref 3.87–5.11)
RDW: 15.3 % (ref 11.5–15.5)
WBC: 8.1 10*3/uL (ref 4.0–10.5)
nRBC: 0 % (ref 0.0–0.2)

## 2019-10-08 LAB — BASIC METABOLIC PANEL
Anion gap: 13 (ref 5–15)
BUN: 17 mg/dL (ref 8–23)
CO2: 25 mmol/L (ref 22–32)
Calcium: 8.9 mg/dL (ref 8.9–10.3)
Chloride: 101 mmol/L (ref 98–111)
Creatinine, Ser: 1.05 mg/dL — ABNORMAL HIGH (ref 0.44–1.00)
GFR calc Af Amer: >60 mL/min (ref >60)
GFR calc non Af Amer: 53 mL/min — ABNORMAL LOW (ref >60)
Glucose, Bld: 153 mg/dL — ABNORMAL HIGH (ref 70–99)
Potassium: 3.3 mmol/L — ABNORMAL LOW (ref 3.5–5.1)
Sodium: 139 mmol/L (ref 135–145)

## 2019-10-08 LAB — TROPONIN I (HIGH SENSITIVITY): Troponin I (High Sensitivity): 4 ng/L (ref <18)

## 2019-10-08 NOTE — ED Notes (Addendum)
This RN visualized Pt and pt daughter getting in the car and leaving.

## 2019-10-08 NOTE — ED Triage Notes (Signed)
Pt comes into the ED via EMS from home with c/o increased weakness, had to have assist from the toilet today.. pt is a/ox4 on arrival, CBG 169, #[id number]

## 2019-10-08 NOTE — ED Triage Notes (Addendum)
Patient to ER for c/o increased weakness lately. Fell Thursday or Friday night as well (pain to left hip, both knees, left leg).

## 2019-10-11 ENCOUNTER — Telehealth: Payer: Self-pay | Admitting: Emergency Medicine

## 2019-10-11 NOTE — Telephone Encounter (Signed)
Called patient due to lwot to inquire about condition and follow up plans.  No answer and no voicemail  

## 2020-02-05 ENCOUNTER — Inpatient Hospital Stay
Admission: EM | Admit: 2020-02-05 | Discharge: 2020-02-07 | DRG: 871 | Disposition: A | Discharge status: Home / Self Care | Payer: Medicare Other | Attending: Internal Medicine | Admitting: Internal Medicine

## 2020-02-05 ENCOUNTER — Other Ambulatory Visit: Payer: Self-pay

## 2020-02-05 ENCOUNTER — Emergency Department: Payer: Medicare Other

## 2020-02-05 DIAGNOSIS — F32A Depression, unspecified: Secondary | ICD-10-CM | POA: Diagnosis present

## 2020-02-05 DIAGNOSIS — E872 Acidosis, unspecified: Secondary | ICD-10-CM

## 2020-02-05 DIAGNOSIS — E1169 Type 2 diabetes mellitus with other specified complication: Secondary | ICD-10-CM | POA: Diagnosis present

## 2020-02-05 DIAGNOSIS — E785 Hyperlipidemia, unspecified: Secondary | ICD-10-CM | POA: Diagnosis present

## 2020-02-05 DIAGNOSIS — K589 Irritable bowel syndrome without diarrhea: Secondary | ICD-10-CM | POA: Diagnosis present

## 2020-02-05 DIAGNOSIS — R410 Disorientation, unspecified: Secondary | ICD-10-CM

## 2020-02-05 DIAGNOSIS — Z955 Presence of coronary angioplasty implant and graft: Secondary | ICD-10-CM

## 2020-02-05 DIAGNOSIS — K219 Gastro-esophageal reflux disease without esophagitis: Secondary | ICD-10-CM | POA: Diagnosis present

## 2020-02-05 DIAGNOSIS — Z6841 Body Mass Index (BMI) 40.0 and over, adult: Secondary | ICD-10-CM

## 2020-02-05 DIAGNOSIS — A419 Sepsis, unspecified organism: Principal | ICD-10-CM

## 2020-02-05 DIAGNOSIS — I2510 Atherosclerotic heart disease of native coronary artery without angina pectoris: Secondary | ICD-10-CM | POA: Diagnosis present

## 2020-02-05 DIAGNOSIS — Z794 Long term (current) use of insulin: Secondary | ICD-10-CM

## 2020-02-05 DIAGNOSIS — G894 Chronic pain syndrome: Secondary | ICD-10-CM | POA: Diagnosis present

## 2020-02-05 DIAGNOSIS — I252 Old myocardial infarction: Secondary | ICD-10-CM

## 2020-02-05 DIAGNOSIS — Z789 Other specified health status: Secondary | ICD-10-CM

## 2020-02-05 DIAGNOSIS — G9341 Metabolic encephalopathy: Secondary | ICD-10-CM

## 2020-02-05 DIAGNOSIS — I10 Essential (primary) hypertension: Secondary | ICD-10-CM | POA: Diagnosis present

## 2020-02-05 DIAGNOSIS — F419 Anxiety disorder, unspecified: Secondary | ICD-10-CM | POA: Diagnosis present

## 2020-02-05 DIAGNOSIS — M545 Low back pain: Secondary | ICD-10-CM | POA: Diagnosis not present

## 2020-02-05 DIAGNOSIS — Z66 Do not resuscitate: Secondary | ICD-10-CM | POA: Diagnosis present

## 2020-02-05 DIAGNOSIS — G92 Toxic encephalopathy: Secondary | ICD-10-CM | POA: Diagnosis present

## 2020-02-05 DIAGNOSIS — E118 Type 2 diabetes mellitus with unspecified complications: Secondary | ICD-10-CM | POA: Diagnosis present

## 2020-02-05 DIAGNOSIS — F329 Major depressive disorder, single episode, unspecified: Secondary | ICD-10-CM | POA: Diagnosis present

## 2020-02-05 DIAGNOSIS — M810 Age-related osteoporosis without current pathological fracture: Secondary | ICD-10-CM | POA: Diagnosis present

## 2020-02-05 DIAGNOSIS — Z79890 Hormone replacement therapy (postmenopausal): Secondary | ICD-10-CM

## 2020-02-05 DIAGNOSIS — R6510 Systemic inflammatory response syndrome (SIRS) of non-infectious origin without acute organ dysfunction: Secondary | ICD-10-CM | POA: Diagnosis present

## 2020-02-05 DIAGNOSIS — G4733 Obstructive sleep apnea (adult) (pediatric): Secondary | ICD-10-CM

## 2020-02-05 DIAGNOSIS — I110 Hypertensive heart disease with heart failure: Secondary | ICD-10-CM | POA: Diagnosis present

## 2020-02-05 DIAGNOSIS — I509 Heart failure, unspecified: Secondary | ICD-10-CM | POA: Clinically undetermined

## 2020-02-05 DIAGNOSIS — Z20822 Contact with and (suspected) exposure to covid-19: Secondary | ICD-10-CM | POA: Diagnosis present

## 2020-02-05 DIAGNOSIS — R531 Weakness: Secondary | ICD-10-CM

## 2020-02-05 DIAGNOSIS — R0902 Hypoxemia: Secondary | ICD-10-CM

## 2020-02-05 DIAGNOSIS — Z79899 Other long term (current) drug therapy: Secondary | ICD-10-CM

## 2020-02-05 DIAGNOSIS — E039 Hypothyroidism, unspecified: Secondary | ICD-10-CM | POA: Diagnosis present

## 2020-02-05 DIAGNOSIS — J9601 Acute respiratory failure with hypoxia: Secondary | ICD-10-CM

## 2020-02-05 DIAGNOSIS — E1165 Type 2 diabetes mellitus with hyperglycemia: Secondary | ICD-10-CM | POA: Diagnosis present

## 2020-02-05 LAB — CBC
HCT: 36.3 % (ref 36.0–46.0)
Hemoglobin: 11.1 g/dL — ABNORMAL LOW (ref 12.0–15.0)
MCH: 28.4 pg (ref 26.0–34.0)
MCHC: 30.6 g/dL (ref 30.0–36.0)
MCV: 92.8 fL (ref 80.0–100.0)
Platelets: 128 10*3/uL — ABNORMAL LOW (ref 150–400)
RBC: 3.91 MIL/uL (ref 3.87–5.11)
RDW: 15.6 % — ABNORMAL HIGH (ref 11.5–15.5)
WBC: 6.8 10*3/uL (ref 4.0–10.5)
nRBC: 0 % (ref 0.0–0.2)

## 2020-02-05 LAB — BASIC METABOLIC PANEL
Anion gap: 9 (ref 5–15)
BUN: 20 mg/dL (ref 8–23)
CO2: 21 mmol/L — ABNORMAL LOW (ref 22–32)
Calcium: 8.5 mg/dL — ABNORMAL LOW (ref 8.9–10.3)
Chloride: 109 mmol/L (ref 98–111)
Creatinine, Ser: 0.98 mg/dL (ref 0.44–1.00)
GFR calc Af Amer: >60 mL/min (ref >60)
GFR calc non Af Amer: 58 mL/min — ABNORMAL LOW (ref >60)
Glucose, Bld: 165 mg/dL — ABNORMAL HIGH (ref 70–99)
Potassium: 4 mmol/L (ref 3.5–5.1)
Sodium: 139 mmol/L (ref 135–145)

## 2020-02-05 LAB — HEPATIC FUNCTION PANEL
ALT: 26 U/L (ref 0–44)
AST: 57 U/L — ABNORMAL HIGH (ref 15–41)
Albumin: 3.6 g/dL (ref 3.5–5.0)
Alkaline Phosphatase: 52 U/L (ref 38–126)
Bilirubin, Direct: 0.1 mg/dL (ref 0.0–0.2)
Indirect Bilirubin: 0.7 mg/dL (ref 0.3–0.9)
Total Bilirubin: 0.8 mg/dL (ref 0.3–1.2)
Total Protein: 6.5 g/dL (ref 6.5–8.1)

## 2020-02-05 LAB — BLOOD GAS, VENOUS
Acid-base deficit: 5.4 mmol/L — ABNORMAL HIGH (ref 0.0–2.0)
Bicarbonate: 21.6 mmol/L (ref 20.0–28.0)
O2 Saturation: 94.1 %
Patient temperature: 37
pCO2, Ven: 47 mmHg (ref 44.0–60.0)
pH, Ven: 7.27 (ref 7.250–7.430)
pO2, Ven: 81 mmHg — ABNORMAL HIGH (ref 32.0–45.0)

## 2020-02-05 LAB — LACTIC ACID, PLASMA: Lactic Acid, Venous: 2.4 mmol/L (ref 0.5–1.9)

## 2020-02-05 MED ORDER — SODIUM CHLORIDE 0.9 % IV SOLN
2.0000 g | Freq: Once | INTRAVENOUS | Status: AC
  Administered 2020-02-06: 2 g via INTRAVENOUS
  Filled 2020-02-05: qty 20

## 2020-02-05 MED ORDER — SODIUM CHLORIDE 0.9 % IV BOLUS
1000.0000 mL | Freq: Once | INTRAVENOUS | Status: AC
  Administered 2020-02-05: 1000 mL via INTRAVENOUS

## 2020-02-05 MED ORDER — SODIUM CHLORIDE 0.9 % IV BOLUS
1000.0000 mL | Freq: Once | INTRAVENOUS | Status: AC
  Administered 2020-02-06: 1000 mL via INTRAVENOUS

## 2020-02-05 NOTE — ED Triage Notes (Signed)
Pt arrives to ED from home via [facility] with c/c of generalized weakness and intermittent confusion. EMS reports responding to pts home three times today to help move patient from bed to commode but the family wouldn't transport until the most recent visit. EMS reports initial vitals of 120/85, p110, temp 99.2, O2 sat 88% on room air improving to 94% on 2L via nasal cannula, CBG 178. Pt with 22G in left hand. Upon arrival, pt alert and oriented x4 but exhibited increasing confusion as triage continued. Dr Ellender Hose notified.

## 2020-02-05 NOTE — ED Provider Notes (Signed)
[facility] Emergency Department Provider Note  ____________________________________________   First MD Initiated Contact with Patient 02/05/20 2118     (approximate)  I have reviewed the triage vital signs and the nursing notes.   HISTORY  Chief Complaint Weakness and Altered Mental Status    HPI [name] is a 74 y.o. female with past medical history as below including hypertension, hyperlipidemia, CHF, diabetes, here with generalized weakness.  The patient is significantly confused, moaning history.  Per report, EMS has been called out 3 times today to the home, due to general weakness.  The patient was going to the bathroom and was unable to get up.  She reports to me that she has been urinating more frequently, but is unable to provide much more history.  She does states she has had mild cough and occasional sputum production as well.  No family answering the phone at this time.  Per report, the patient has been more confused, weak, and otherwise not herself today.  Level 5 caveat invoked as remainder of history, ROS, and physical exam limited due to patient's AMS.     Past Medical History:  Diagnosis Date  . Amnesia   . Anxiety   . Arthritis   . Back pain   . CHF (congestive heart failure) ([place])   . Chronic pain syndrome   . Cystocele   . Depression   . Diabetes mellitus without complication ([place])   . GERD (gastroesophageal reflux disease)   . Hyperlipidemia   . Hypertension   . IBS (irritable bowel syndrome)   . Myocardial infarction ([place])   . Obesity   . Osteoporosis   . Osteoporosis   . Shortness of breath dyspnea   . Sleep apnea    not using now  . Thyroid nodule     Patient Active Problem List   Diagnosis Date Noted  . Acute metabolic encephalopathy [id number]  . Acute respiratory failure with hypoxia ([place]) 02/06/2020  . OSA (obstructive sleep apnea) 02/06/2020  . CPAP ventilation treatment not tolerated 02/06/2020  .  Morbid obesity with BMI of 45.0-49.9, adult ([place]) 02/06/2020  . Lactic acidosis 02/06/2020  . possible Sepsis ([place]) 02/06/2020  . Polypharmacy 02/06/2020  . CAD (coronary artery disease) 05/05/2018  . Microscopic hematuria 09/17/2016  . Cystitis, chronic 09/17/2016  . Adrenal cortical nodule ([place]) LEFT, small 09/17/2016  . Closed fracture of metatarsal bone 07/22/2016  . Plantar fasciitis, left 05/28/2016  . Degenerative spondylolisthesis 01/02/2016  . Tachycardia 01/02/2016  . Diabetes mellitus with complication ([place])   . Essential hypertension   . Non-toxic multinodular goiter 10/11/2014  . Diabetes mellitus ([place]) 10/11/2014  . Hyperlipidemia 10/11/2014  . Anxiety and depression 10/11/2014  . Osteoporosis 10/11/2014  . GERD (gastroesophageal reflux disease) 10/11/2014  . Arthritis 10/11/2014  . Cystocele 10/11/2014  . Chronic pain syndrome 10/11/2014  . Amnesia 10/11/2014  . Back pain 10/11/2014    Past Surgical History:  Procedure Laterality Date  . ABDOMINAL HYSTERECTOMY    . APPENDECTOMY    . CARDIAC CATHETERIZATION    . CHOLECYSTECTOMY    . CORONARY STENT INTERVENTION N/A 05/05/2018   Procedure: CORONARY STENT INTERVENTION;  Surgeon: Isaias Cowman, MD;  Location: [facility];  Service: Cardiovascular;  Laterality: N/A;  . FOOT SURGERY    . LEFT HEART CATH AND CORONARY ANGIOGRAPHY Left 05/05/2018   Procedure: LEFT HEART CATH AND CORONARY ANGIOGRAPHY;  Surgeon: Isaias Cowman, MD;  Location: [facility];  Service: Cardiovascular;  Laterality: Left;  . OOPHORECTOMY    . REPLACEMENT TOTAL KNEE    . TONSILLECTOMY      Prior to Admission medications   Medication Sig Start Date End Date Taking? Authorizing Provider  busPIRone (BUSPAR) 30 MG tablet Take 30 mg by mouth 2 (two) times daily.    [provider]  cefpodoxime (VANTIN) 200 MG tablet Take 200 mg by mouth 2 (two) times daily.    [provider]  Cholecalciferol (VITAMIN  D3) 1000 units CAPS Take 1,000 Units by mouth daily.     [provider]  clonazePAM (KLONOPIN) 0.5 MG tablet Take 0.5 mg by mouth daily.  02/17/17   [provider]  cyclobenzaprine (FLEXERIL) 5 MG tablet Take 5 mg by mouth daily.     [provider]  doxepin (SINEQUAN) 10 MG capsule Take 10 mg by mouth at bedtime.     [provider]  gabapentin (NEURONTIN) 300 MG capsule Take 300 mg by mouth 3 (three) times daily. 03/06/17   [provider]  LANTUS SOLOSTAR 100 UNIT/ML Solostar Pen Inject 72 Units into the skin every evening.  12/04/16   [provider]  levothyroxine (SYNTHROID, LEVOTHROID) 75 MCG tablet Take 75 mcg by mouth daily before breakfast.  08/27/16   [provider]  lisinopril (PRINIVIL,ZESTRIL) 40 MG tablet Take 40 mg by mouth.    [provider]  metFORMIN (GLUCOPHAGE) 500 MG tablet Take 500 mg by mouth 2 (two) times daily with a meal.  07/24/16   [provider]  metoprolol succinate (TOPROL-XL) 50 MG 24 hr tablet TAKE 50 MG TOTAL BY MOUTH ONCE DAILY. 07/24/16   [provider]  nystatin (MYCOSTATIN) powder Apply 1 Bottle topically 2 (two) times daily. Breast/stomach folds 08/04/14   [provider]  pioglitazone (ACTOS) 45 MG tablet Take 45 mg by mouth daily. 02/02/15   [provider]  simvastatin (ZOCOR) 40 MG tablet Take 40 mg by mouth at bedtime.  07/24/16   [provider]  tolterodine (DETROL LA) 4 MG 24 hr capsule Take 1 capsule (4 mg total) by mouth daily. Start with 1/2 tablet daily x 1 week, then increase to whole tablet if needed. Patient not taking: Reported on 09/17/2016 10/03/15   Rubie Maid, MD  topiramate (TOPAMAX) 50 MG tablet Take 50 mg by mouth daily.    [provider]    Allergies Patient has no known allergies.  Family History  Problem Relation Age of Onset  . Cancer Mother   . Cancer Father   . Cancer Sister   . Thyroid disease Neg  Hx     Social History Social History   Tobacco Use  . Smoking status: Never Smoker  . Smokeless tobacco: Never Used  Substance Use Topics  . Alcohol use: No  . Drug use: Never    Review of Systems  Review of Systems  Unable to perform ROS: Mental status change     ____________________________________________  PHYSICAL EXAM:      VITAL SIGNS: ED Triage Vitals  Enc Vitals Group     BP 02/05/20 2119 122/70     Pulse Rate 02/05/20 2119 (!) 104     Resp 02/05/20 2119 17     Temp 02/05/20 2119 98.3 F (36.8 C)     Temp src --      SpO2 02/05/20 2119 94 %     Weight 02/05/20 2121 280 lb (127 kg)     Height 02/05/20 2121 5'  3" (1.6 m)     Head Circumference --      Peak Flow --      Pain Score 02/05/20 2121 0     Pain Loc --      Pain Edu? --      Excl. in [place]? --      Physical Exam Vitals and nursing note reviewed.  Constitutional:      General: She is not in acute distress.    Appearance: She is well-developed.  HENT:     Head: Normocephalic and atraumatic.  Eyes:     Conjunctiva/sclera: Conjunctivae normal.  Cardiovascular:     Rate and Rhythm: Regular rhythm. Tachycardia present.     Heart sounds: Normal heart sounds.  Pulmonary:     Effort: Pulmonary effort is normal. Tachypnea present. No respiratory distress.     Breath sounds: Examination of the right-lower field reveals rhonchi. Examination of the left-lower field reveals rhonchi. Decreased breath sounds and rhonchi present. No wheezing.  Abdominal:     General: There is no distension.  Musculoskeletal:     Cervical back: Neck supple.  Skin:    General: Skin is warm.     Capillary Refill: Capillary refill takes less than 2 seconds.     Findings: No rash.  Neurological:     Mental Status: She is alert. She is disoriented and confused.     Motor: No abnormal muscle tone.       ____________________________________________   LABS (all labs ordered are listed, but only abnormal results are  displayed)  Labs Reviewed  BASIC METABOLIC PANEL - Abnormal; Notable for the following components:      Result Value   CO2 21 (*)    Glucose, Bld 165 (*)    Calcium 8.5 (*)    GFR calc non Af Amer 58 (*)    All other components within normal limits  CBC - Abnormal; Notable for the following components:   Hemoglobin 11.1 (*)    RDW 15.6 (*)    Platelets 128 (*)    All other components within normal limits  URINALYSIS, COMPLETE (UACMP) WITH MICROSCOPIC - Abnormal; Notable for the following components:   Color, Urine AMBER (*)    APPearance HAZY (*)    Bilirubin Urine SMALL (*)    Protein, ur 30 (*)    All other components within normal limits  LACTIC ACID, PLASMA - Abnormal; Notable for the following components:   Lactic Acid, Venous 2.4 (*)    All other components within normal limits  HEPATIC FUNCTION PANEL - Abnormal; Notable for the following components:   AST 57 (*)    All other components within normal limits  BLOOD GAS, VENOUS - Abnormal; Notable for the following components:   pO2, Ven 81.0 (*)    Acid-base deficit 5.4 (*)    All other components within normal limits  CULTURE, BLOOD (ROUTINE X 2)  CULTURE, BLOOD (ROUTINE X 2)  URINE CULTURE  RESPIRATORY PANEL BY RT PCR (FLU A&B, COVID)  LACTIC ACID, PLASMA  HIV ANTIBODY (ROUTINE TESTING W REFLEX)  HEMOGLOBIN A1C  POC SARS CORONAVIRUS 2 AG -  ED  POC SARS CORONAVIRUS 2 AG  CBG MONITORING, ED  TROPONIN I (HIGH SENSITIVITY)  TROPONIN I (HIGH SENSITIVITY)    ____________________________________________  EKG: Sinus tachycardia, VR 105. QRS 91, QTc 568.  Sinus tachycardia, poor R wave progression noted. No acute ST elevations or depressions. ________________________________________  RADIOLOGY All imaging, including plain films, CT scans, and ultrasounds,  independently reviewed by me, and interpretations confirmed via formal radiology reads.  ED MD interpretation:   CXR: Clear, low lung volumes CT Head:  [place]  Official radiology report(s): CT Head Wo Contrast  Result Date: 02/05/2020 CLINICAL DATA:  Generalized weakness, intermittent confusion, history CHF, hypertension, diabetes mellitus, MI EXAM: CT HEAD WITHOUT CONTRAST TECHNIQUE: Contiguous axial images were obtained from the base of the skull through the vertex without intravenous contrast. Sagittal and coronal MPR images reconstructed from axial data set. COMPARISON:  11/18/2013 FINDINGS: Brain: Minimal atrophy for age. Normal ventricular morphology. No midline shift or mass effect. Otherwise normal appearance of brain parenchyma. No intracranial hemorrhage, mass lesion, or evidence of acute infarction. No extra-axial fluid collections. Scattered motion artifacts. Vascular: No hyperdense vessels Skull: Intact Sinuses/Orbits: Clear Other: N/A IMPRESSION: No acute intracranial abnormalities. Electronically Signed   By: Lavonia Dana M.D.   On: 02/05/2020 23:17   DG Chest Portable 1 View  Result Date: 02/05/2020 CLINICAL DATA:  Shortness of breath. Generalized weakness. Intermittent confusion. EXAM: PORTABLE CHEST 1 VIEW COMPARISON:  Radiograph 11/08/2013, CT 05/16/2011 FINDINGS: Lower lung volumes from prior exam. Chronic elevation of right hemidiaphragm. Borderline cardiomegaly with unchanged mediastinal contours. Calcified left hilar nodes. No acute airspace disease, pleural effusion or pneumothorax. No acute osseous abnormalities are seen. Chronic degenerative change of the shoulders. IMPRESSION: Low lung volumes without acute abnormality. Electronically Signed   By: Keith Rake M.D.   On: 02/05/2020 22:16    ____________________________________________  PROCEDURES   Procedure(s) performed (including Critical Care):  .1-3 Lead EKG Interpretation Performed by: Duffy Bruce, MD Authorized by: Duffy Bruce, MD     Interpretation: abnormal     ECG rate:  100-110   ECG rate assessment: tachycardic     Rhythm: sinus tachycardia      Ectopy: PVCs     Conduction: normal   Comments:     Indication: Hypoxia, altered mental status    ____________________________________________  INITIAL IMPRESSION / MDM / ASSESSMENT AND PLAN / ED COURSE  As part of my medical decision making, I reviewed the following data within the [facility] notes reviewed and incorporated, Old chart reviewed, Notes from prior ED visits, and [place] Controlled Substance Database       *[name] was evaluated in Emergency Department on 02/06/2020 for the symptoms described in the history of present illness. She was evaluated in the context of the global COVID-19 pandemic, which necessitated consideration that the patient might be at risk for infection with the SARS-CoV-2 virus that causes COVID-19. Institutional protocols and algorithms that pertain to the evaluation of patients at risk for COVID-19 are in a state of rapid change based on information released by regulatory bodies including the CDC and federal and state organizations. These policies and algorithms were followed during the patient's care in the ED.  Some ED evaluations and interventions may be delayed as a result of limited staffing during the pandemic.*     Medical Decision Making:  74 yo F here with acute altered mental status. I suspect this is primarily 2/2 dehydration, possible pneumonia with hypoxia. She is requiring 2L [place] here. Mild lactic acidosis noted, though WBC normal. Will start fluids, empiric Rocephin/Azithro given +cough, +hypoxia and lung exam. Will send for CT Angio for further assessment of lungs as well as eval for PE. UA negative. Labs are otherwise largely reassuring. No focal neuro deficits and CT head neg. VBG without acute retention. Will plan to admit for encephalopathy, mild  hypoxia.  ____________________________________________  FINAL CLINICAL IMPRESSION(S) / ED DIAGNOSES  Final diagnoses:  Weakness  Confusion  Hypoxia      MEDICATIONS GIVEN DURING THIS VISIT:  Medications  azithromycin (ZITHROMAX) 500 mg in sodium chloride 0.9 % 250 mL IVPB (has no administration in time range)  acetaminophen (TYLENOL) tablet 650 mg (has no administration in time range)    Or  acetaminophen (TYLENOL) suppository 650 mg (has no administration in time range)  ondansetron (ZOFRAN) tablet 4 mg (has no administration in time range)    Or  ondansetron (ZOFRAN) injection 4 mg (has no administration in time range)  senna-docusate (Senokot-S) tablet 1 tablet (has no administration in time range)  enoxaparin (LOVENOX) injection 40 mg (has no administration in time range)  0.9 %  sodium chloride infusion (has no administration in time range)  insulin aspart (novoLOG) injection 0-20 Units (has no administration in time range)  insulin aspart (novoLOG) injection 0-5 Units (has no administration in time range)  iohexol (OMNIPAQUE) 350 MG/ML injection 100 mL (has no administration in time range)  sodium chloride 0.9 % bolus 1,000 mL (0 mLs Intravenous Stopped 02/06/20 0059)  sodium chloride 0.9 % bolus 1,000 mL (1,000 mLs Intravenous New Bag/Given 02/06/20 0110)  cefTRIAXone (ROCEPHIN) 2 g in sodium chloride 0.9 % 100 mL IVPB (0 g Intravenous Stopped 02/06/20 0202)     ED Discharge Orders    None       Note:  This document was prepared using Dragon voice recognition software and may include unintentional dictation errors.   Duffy Bruce, MD 02/06/20 [id number]

## 2020-02-06 ENCOUNTER — Inpatient Hospital Stay: Payer: Medicare Other

## 2020-02-06 ENCOUNTER — Encounter: Payer: Self-pay | Admitting: Internal Medicine

## 2020-02-06 DIAGNOSIS — M810 Age-related osteoporosis without current pathological fracture: Secondary | ICD-10-CM | POA: Diagnosis present

## 2020-02-06 DIAGNOSIS — E872 Acidosis, unspecified: Secondary | ICD-10-CM

## 2020-02-06 DIAGNOSIS — G92 Toxic encephalopathy: Secondary | ICD-10-CM | POA: Diagnosis present

## 2020-02-06 DIAGNOSIS — A419 Sepsis, unspecified organism: Secondary | ICD-10-CM

## 2020-02-06 DIAGNOSIS — K219 Gastro-esophageal reflux disease without esophagitis: Secondary | ICD-10-CM | POA: Diagnosis present

## 2020-02-06 DIAGNOSIS — F329 Major depressive disorder, single episode, unspecified: Secondary | ICD-10-CM | POA: Diagnosis present

## 2020-02-06 DIAGNOSIS — E1165 Type 2 diabetes mellitus with hyperglycemia: Secondary | ICD-10-CM | POA: Diagnosis present

## 2020-02-06 DIAGNOSIS — J9601 Acute respiratory failure with hypoxia: Secondary | ICD-10-CM

## 2020-02-06 DIAGNOSIS — Z6841 Body Mass Index (BMI) 40.0 and over, adult: Secondary | ICD-10-CM

## 2020-02-06 DIAGNOSIS — Z789 Other specified health status: Secondary | ICD-10-CM

## 2020-02-06 DIAGNOSIS — F419 Anxiety disorder, unspecified: Secondary | ICD-10-CM | POA: Diagnosis present

## 2020-02-06 DIAGNOSIS — E039 Hypothyroidism, unspecified: Secondary | ICD-10-CM | POA: Diagnosis present

## 2020-02-06 DIAGNOSIS — I509 Heart failure, unspecified: Secondary | ICD-10-CM | POA: Diagnosis not present

## 2020-02-06 DIAGNOSIS — I2510 Atherosclerotic heart disease of native coronary artery without angina pectoris: Secondary | ICD-10-CM | POA: Diagnosis present

## 2020-02-06 DIAGNOSIS — Z66 Do not resuscitate: Secondary | ICD-10-CM | POA: Diagnosis present

## 2020-02-06 DIAGNOSIS — R410 Disorientation, unspecified: Secondary | ICD-10-CM | POA: Diagnosis present

## 2020-02-06 DIAGNOSIS — R6510 Systemic inflammatory response syndrome (SIRS) of non-infectious origin without acute organ dysfunction: Secondary | ICD-10-CM | POA: Diagnosis present

## 2020-02-06 DIAGNOSIS — M545 Low back pain: Secondary | ICD-10-CM | POA: Diagnosis not present

## 2020-02-06 DIAGNOSIS — G9341 Metabolic encephalopathy: Secondary | ICD-10-CM | POA: Diagnosis present

## 2020-02-06 DIAGNOSIS — K589 Irritable bowel syndrome without diarrhea: Secondary | ICD-10-CM | POA: Diagnosis present

## 2020-02-06 DIAGNOSIS — Z20822 Contact with and (suspected) exposure to covid-19: Secondary | ICD-10-CM | POA: Diagnosis present

## 2020-02-06 DIAGNOSIS — I110 Hypertensive heart disease with heart failure: Secondary | ICD-10-CM | POA: Diagnosis present

## 2020-02-06 DIAGNOSIS — E785 Hyperlipidemia, unspecified: Secondary | ICD-10-CM | POA: Diagnosis present

## 2020-02-06 DIAGNOSIS — G4733 Obstructive sleep apnea (adult) (pediatric): Secondary | ICD-10-CM

## 2020-02-06 DIAGNOSIS — Z79899 Other long term (current) drug therapy: Secondary | ICD-10-CM

## 2020-02-06 DIAGNOSIS — G894 Chronic pain syndrome: Secondary | ICD-10-CM | POA: Diagnosis present

## 2020-02-06 LAB — RESPIRATORY PANEL BY RT PCR (FLU A&B, COVID)
Influenza A by PCR: NEGATIVE
Influenza B by PCR: NEGATIVE
SARS Coronavirus 2 by RT PCR: NEGATIVE

## 2020-02-06 LAB — URINALYSIS, COMPLETE (UACMP) WITH MICROSCOPIC
Bacteria, UA: NONE SEEN
Glucose, UA: NEGATIVE mg/dL
Hgb urine dipstick: NEGATIVE
Ketones, ur: NEGATIVE mg/dL
Leukocytes,Ua: NEGATIVE
Nitrite: NEGATIVE
Protein, ur: 30 mg/dL — AB
Specific Gravity, Urine: 1.023 (ref 1.005–1.030)
pH: 5 (ref 5.0–8.0)

## 2020-02-06 LAB — POC SARS CORONAVIRUS 2 AG: SARS Coronavirus 2 Ag: NEGATIVE

## 2020-02-06 LAB — GLUCOSE, CAPILLARY
Glucose-Capillary: 112 mg/dL — ABNORMAL HIGH (ref 70–99)
Glucose-Capillary: 117 mg/dL — ABNORMAL HIGH (ref 70–99)
Glucose-Capillary: 144 mg/dL — ABNORMAL HIGH (ref 70–99)
Glucose-Capillary: 139 mg/dL — ABNORMAL HIGH (ref 70–99)
Glucose-Capillary: 141 mg/dL — ABNORMAL HIGH (ref 70–99)

## 2020-02-06 LAB — TROPONIN I (HIGH SENSITIVITY)
Troponin I (High Sensitivity): 7 ng/L (ref <18)
Troponin I (High Sensitivity): 9 ng/L (ref <18)

## 2020-02-06 LAB — HEMOGLOBIN A1C
Hgb A1c MFr Bld: 6.3 % — ABNORMAL HIGH (ref 4.8–5.6)
Mean Plasma Glucose: 134.11 mg/dL

## 2020-02-06 LAB — HIV ANTIBODY (ROUTINE TESTING W REFLEX): HIV Screen 4th Generation wRfx: NONREACTIVE

## 2020-02-06 LAB — LACTIC ACID, PLASMA: Lactic Acid, Venous: 1.4 mmol/L (ref 0.5–1.9)

## 2020-02-06 MED ORDER — LEVOTHYROXINE SODIUM 75 MCG PO TABS
75.0000 ug | ORAL_TABLET | Freq: Every day | ORAL | Status: DC
  Administered 2020-02-07: 75 ug via ORAL
  Filled 2020-02-06: qty 3
  Filled 2020-02-06: qty 1

## 2020-02-06 MED ORDER — SODIUM CHLORIDE 0.9 % IV SOLN
500.0000 mg | Freq: Once | INTRAVENOUS | Status: AC
  Administered 2020-02-06: 500 mg via INTRAVENOUS
  Filled 2020-02-06: qty 500

## 2020-02-06 MED ORDER — SODIUM CHLORIDE 0.9 % IV SOLN
2.0000 g | INTRAVENOUS | Status: DC
  Administered 2020-02-07: 2 g via INTRAVENOUS
  Filled 2020-02-06: qty 20
  Filled 2020-02-06: qty 2

## 2020-02-06 MED ORDER — BISACODYL 10 MG RE SUPP
10.0000 mg | Freq: Once | RECTAL | Status: DC
  Filled 2020-02-06: qty 1

## 2020-02-06 MED ORDER — MELATONIN 5 MG PO TABS
10.0000 mg | ORAL_TABLET | Freq: Every evening | ORAL | Status: DC | PRN
  Administered 2020-02-06: 10 mg via ORAL
  Filled 2020-02-06: qty 2

## 2020-02-06 MED ORDER — GABAPENTIN 300 MG PO CAPS
300.0000 mg | ORAL_CAPSULE | Freq: Three times a day (TID) | ORAL | Status: DC
  Administered 2020-02-06 – 2020-02-07 (×3): 300 mg via ORAL
  Filled 2020-02-06 (×3): qty 1

## 2020-02-06 MED ORDER — METOPROLOL SUCCINATE ER 50 MG PO TB24
50.0000 mg | ORAL_TABLET | Freq: Every day | ORAL | Status: DC
  Administered 2020-02-06 – 2020-02-07 (×2): 50 mg via ORAL
  Filled 2020-02-06 (×2): qty 1

## 2020-02-06 MED ORDER — CLONAZEPAM 0.5 MG PO TABS
0.5000 mg | ORAL_TABLET | Freq: Every day | ORAL | Status: DC
  Administered 2020-02-06 – 2020-02-07 (×2): 0.5 mg via ORAL
  Filled 2020-02-06 (×2): qty 1

## 2020-02-06 MED ORDER — DOXEPIN HCL 10 MG PO CAPS
10.0000 mg | ORAL_CAPSULE | Freq: Every day | ORAL | Status: DC
  Administered 2020-02-06: 10 mg via ORAL
  Filled 2020-02-06 (×2): qty 1

## 2020-02-06 MED ORDER — LISINOPRIL 20 MG PO TABS
40.0000 mg | ORAL_TABLET | Freq: Every day | ORAL | Status: DC
  Administered 2020-02-06 – 2020-02-07 (×2): 40 mg via ORAL
  Filled 2020-02-06 (×2): qty 2

## 2020-02-06 MED ORDER — ONDANSETRON HCL 4 MG PO TABS: 4.0000 mg | ORAL_TABLET | Freq: Four times a day (QID) | ORAL | Status: DC | PRN

## 2020-02-06 MED ORDER — SIMVASTATIN 20 MG PO TABS
40.0000 mg | ORAL_TABLET | Freq: Every day | ORAL | Status: DC
  Administered 2020-02-06: 40 mg via ORAL
  Filled 2020-02-06: qty 2

## 2020-02-06 MED ORDER — INSULIN GLARGINE 100 UNIT/ML ~~LOC~~ SOLN
35.0000 [IU] | Freq: Every evening | SUBCUTANEOUS | Status: DC
  Administered 2020-02-06: 35 [IU] via SUBCUTANEOUS
  Filled 2020-02-06 (×3): qty 0.35

## 2020-02-06 MED ORDER — BUSPIRONE HCL 15 MG PO TABS
30.0000 mg | ORAL_TABLET | Freq: Two times a day (BID) | ORAL | Status: DC
  Administered 2020-02-06 – 2020-02-07 (×3): 30 mg via ORAL
  Filled 2020-02-06 (×5): qty 2

## 2020-02-06 MED ORDER — OXYCODONE HCL 5 MG PO TABS
10.0000 mg | ORAL_TABLET | Freq: Four times a day (QID) | ORAL | Status: DC | PRN
  Administered 2020-02-06 – 2020-02-07 (×4): 10 mg via ORAL
  Filled 2020-02-06 (×4): qty 2

## 2020-02-06 MED ORDER — ENOXAPARIN SODIUM 40 MG/0.4ML ~~LOC~~ SOLN
40.0000 mg | Freq: Two times a day (BID) | SUBCUTANEOUS | Status: DC
  Administered 2020-02-06 – 2020-02-07 (×3): 40 mg via SUBCUTANEOUS
  Filled 2020-02-06 (×3): qty 0.4

## 2020-02-06 MED ORDER — INSULIN ASPART 100 UNIT/ML ~~LOC~~ SOLN: 0.0000 [IU] | Freq: Every day | SUBCUTANEOUS | Status: DC

## 2020-02-06 MED ORDER — SENNOSIDES-DOCUSATE SODIUM 8.6-50 MG PO TABS: 1.0000 | ORAL_TABLET | Freq: Every evening | ORAL | Status: DC | PRN

## 2020-02-06 MED ORDER — IOHEXOL 350 MG/ML SOLN
100.0000 mL | Freq: Once | INTRAVENOUS | Status: AC | PRN
  Administered 2020-02-06: 100 mL via INTRAVENOUS

## 2020-02-06 MED ORDER — INSULIN ASPART 100 UNIT/ML ~~LOC~~ SOLN
0.0000 [IU] | Freq: Three times a day (TID) | SUBCUTANEOUS | Status: DC
  Administered 2020-02-06 – 2020-02-07 (×3): 3 [IU] via SUBCUTANEOUS
  Administered 2020-02-07: 4 [IU] via SUBCUTANEOUS
  Filled 2020-02-06 (×4): qty 1

## 2020-02-06 MED ORDER — ACETAMINOPHEN 325 MG PO TABS: 650.0000 mg | ORAL_TABLET | Freq: Four times a day (QID) | ORAL | Status: DC | PRN

## 2020-02-06 MED ORDER — ONDANSETRON HCL 4 MG/2ML IJ SOLN: 4.0000 mg | Freq: Four times a day (QID) | INTRAMUSCULAR | Status: DC | PRN

## 2020-02-06 MED ORDER — CYCLOBENZAPRINE HCL 10 MG PO TABS
5.0000 mg | ORAL_TABLET | Freq: Every day | ORAL | Status: DC
  Administered 2020-02-06 – 2020-02-07 (×2): 5 mg via ORAL
  Filled 2020-02-06 (×2): qty 1

## 2020-02-06 MED ORDER — VITAMIN D 25 MCG (1000 UNIT) PO TABS
1000.0000 [IU] | ORAL_TABLET | Freq: Every day | ORAL | Status: DC
  Administered 2020-02-06 – 2020-02-07 (×2): 1000 [IU] via ORAL
  Filled 2020-02-06 (×2): qty 1

## 2020-02-06 MED ORDER — SODIUM CHLORIDE 0.9 % IV SOLN
500.0000 mg | INTRAVENOUS | Status: DC
  Administered 2020-02-07: 500 mg via INTRAVENOUS
  Filled 2020-02-06: qty 500

## 2020-02-06 MED ORDER — SODIUM CHLORIDE 0.9 % IV SOLN: INTRAVENOUS | Status: AC

## 2020-02-06 MED ORDER — ACETAMINOPHEN 650 MG RE SUPP: 650.0000 mg | Freq: Four times a day (QID) | RECTAL | Status: DC | PRN

## 2020-02-06 MED ORDER — TOPIRAMATE 25 MG PO TABS
50.0000 mg | ORAL_TABLET | Freq: Every day | ORAL | Status: DC
  Administered 2020-02-06 – 2020-02-07 (×2): 50 mg via ORAL
  Filled 2020-02-06 (×3): qty 2

## 2020-02-06 NOTE — Progress Notes (Addendum)
PROGRESS NOTE                                                                                                                                                                                                             Patient Demographics:    [name], is a 74 y.o. female, DOB - 1946/05/12, [id number]  Admit date - 02/05/2020   Admitting Physician Athena Masse, MD  Outpatient Primary MD for the patient is Remi Haggard, FNP  LOS - 0  Outpatient Specialists: Jeanella Anton NP-C [facility] pain clinic)  Chief Complaint  Patient presents with  . Weakness  . Altered Mental Status       Brief Narrative Patient admitted early this morning. 74 year old morbidly obese female with CAD s/p LAD stent in 2019, anxiety, depression on multiple psychotropic medications, OSA intolerant to CPAP, hypothyroidism, hypertension chronic back pain on long-term oxycodone, recently started on buprenorphine (few days back) brought to the ED with generalized weakness with drowsiness and lethargy for past 2-3 days.  Reported some nonproductive cough.  EMS found her to have temperature of 90 34F, O2 sat of 82% room air and tachycardic to 110s. In the ED patient was lethargic and confused.  Sats stable on 2 L nasal cannula.  Blood pressure dropped to 85/64 in the ED and received IV fluid bolus.  Blood work showed venous ABG with pH 7.27 and PCO2 47. Lactic acid elevated at 2.41 WBC and chemistry was normal. Head CT unremarkable and EKG showed sinus tachycardia only.  CT angiogram of the chest negative for PE and showed bibasilar atelectasis.  Admitted for further management and concern for AMS secondary to new narcotic use and possible underlying pneumonia.   Subjective:   Patient awake and oriented this morning.  Complains of low back pain.  Denies any cough or shortness of breath.  Assessment  & Plan :    Principal  Problem:   Acute toxic metabolic encephalopathy I suspect this is related to her recently started buprenorphine and possible underlying pneumonia. Mental status now improved to baseline. On multiple psychotropic agents (BuSpar, Klonopin, Flexeril, Sinequan, Neurontin and Topamax) all were held on admission. Patient reports that she has been on them for a long time and I will resume them. I will also resume her oxycodone but will discontinue buprenorphine (Belbuca) that  was recently started and likely contributing to her encephalopathy with other drug interactions. Instructed patient that she needs to go back to the pain clinic after discharge  Verified with her outpatient pharmacy.  Patient is on Oxycodone 10 mg 4 times a day as needed (was refilled on 3/31).  Also started on Belbuca 300 ug films bid.    Active Problems: System inflammatory response syndrome (SIRS) Had nonproductive cough, low-grade fever with hypoxia with elevated lactic acid on presentation.  Possibly for acute bronchitis versus early pneumonia although imaging is negative. Treating with empiric Rocephin and azithromycin.  UA unremarkable.  Follow blood culture.  Chronic anxiety and depression Resume home meds with close monitoring.  Chronic low back pain Resume oxycodone but hold Belbuca.  (Verified from her outpatient pharmacy)  Diabetes mellitus, uncontrolled with hyperglycemia On Lantus 72 units at home.  Resumed at 35 units daily with sliding scale coverage.  CBG currently stable.  Holding home Metformin.  Hypothyroidism Continue Synthroid  Essential hypertension Continue lisinopril and metoprolol.  Coronary artery disease History of DES to proximal LAD in 2019. No chest pain symptoms or EKG changes on admission.  Continue metoprolol  OSA Intolerant to CPAP  Morbid obesity (BMI 51.39 kg/m)       Code Status : DNR  Family Communication  : Daughter at bedside  Disposition Plan  : Home tomorrow  if mental status continues to improve and no further risk for sepsis   Barriers For Discharge : Active symptoms, encephalopathy, SIRS  Consults  : None  Procedures  : CT head, CT angiogram chest  DVT Prophylaxis  :  Lovenox -  Lab Results  Component Value Date   PLT 128 (L) 02/05/2020    Antibiotics  :   Anti-infectives (From admission, onward)   Start     Dose/Rate Route Frequency Ordered Stop   02/06/20 0015  azithromycin (ZITHROMAX) 500 mg in sodium chloride 0.9 % 250 mL IVPB     500 mg 250 mL/hr over 60 Minutes Intravenous  Once 02/06/20 0010 02/06/20 0458   02/06/20 0000  cefTRIAXone (ROCEPHIN) 2 g in sodium chloride 0.9 % 100 mL IVPB     2 g 200 mL/hr over 30 Minutes Intravenous  Once 02/05/20 2345 02/06/20 0202        Objective:   Vitals:   02/06/20 0311 02/06/20 0334 02/06/20 0806 02/06/20 1156  BP: 112/62 123/60 126/63 (!) 119/49  Pulse: 90 88 91 90  Resp: 17 18 17 17   Temp:  99 F (37.2 C) 98.1 F (36.7 C) 98.2 F (36.8 C)  TempSrc:  Oral Oral Oral  SpO2: 97% 100% 98% 100%  Weight:  135.8 kg    Height:  5\' 4"  (1.626 m)      Wt Readings from Last 3 Encounters:  02/06/20 135.8 kg  05/06/18 135.6 kg  03/24/17 117.9 kg     Intake/Output Summary (Last 24 hours) at 02/06/2020 1212 Last data filed at 02/06/2020 0500 Gross per 24 hour  Intake 2120.27 ml  Output --  Net 2120.27 ml     Physical Exam  Gen: Morbidly obese female, not in distress, fatigued HEENT:  moist mucosa, supple neck Chest: clear b/l, no added sounds CVS: N S1&S2, no murmurs,  GI: soft, NT, ND,  Musculoskeletal: warm, no edema CNS: Alert and oriented, nonfocal    Data Review:    CBC Recent Labs  Lab 02/05/20 2153  WBC 6.8  HGB 11.1*  HCT 36.3  PLT 128*  MCV  92.8  MCH 28.4  MCHC 30.6  RDW 15.6*    Chemistries  Recent Labs  Lab 02/05/20 2153 02/05/20 2156  NA 139  --   K 4.0  --   CL 109  --   CO2 21*  --   GLUCOSE 165*  --   BUN 20  --   CREATININE  0.98  --   CALCIUM 8.5*  --   AST  --  57*  ALT  --  26  ALKPHOS  --  52  BILITOT  --  0.8   ------------------------------------------------------------------------------------------------------------------ No results for input(s): CHOL, HDL, LDLCALC, TRIG, CHOLHDL, LDLDIRECT in the last 72 hours.  Lab Results  Component Value Date   HGBA1C 6.3 (H) 02/06/2020   ------------------------------------------------------------------------------------------------------------------ No results for input(s): TSH, T4TOTAL, T3FREE, THYROIDAB in the last 72 hours.  Invalid input(s): FREET3 ------------------------------------------------------------------------------------------------------------------ No results for input(s): VITAMINB12, FOLATE, FERRITIN, TIBC, IRON, RETICCTPCT in the last 72 hours.  Coagulation profile No results for input(s): INR, PROTIME in the last 168 hours.  No results for input(s): DDIMER in the last 72 hours.  Cardiac Enzymes No results for input(s): CKMB, TROPONINI, MYOGLOBIN in the last 168 hours.  Invalid input(s): CK ------------------------------------------------------------------------------------------------------------------ No results found for: BNP  Inpatient Medications  Scheduled Meds: . enoxaparin (LOVENOX) injection  40 mg Subcutaneous Q12H  . insulin aspart  0-20 Units Subcutaneous TID WC  . insulin aspart  0-5 Units Subcutaneous QHS   Continuous Infusions: PRN Meds:.acetaminophen **OR** acetaminophen, ondansetron **OR** ondansetron (ZOFRAN) IV, senna-docusate  Micro Results Recent Results (from the past 240 hour(s))  Blood culture (routine x 2)     Status: None (Preliminary result)   Collection Time: 02/05/20 10:18 PM   Specimen: BLOOD  Result Value Ref Range Status   Specimen Description BLOOD BLOOD RIGHT FOREARM  Final   Special Requests   Final    BOTTLES DRAWN AEROBIC AND ANAEROBIC Blood Culture results may not be optimal due to an  inadequate volume of blood received in culture bottles   Culture   Final    NO GROWTH < 12 HOURS Performed at [facility], [address] [place], [place] [address]    Report Status PENDING  Incomplete  Blood culture (routine x 2)     Status: None (Preliminary result)   Collection Time: 02/05/20 10:19 PM   Specimen: BLOOD  Result Value Ref Range Status   Specimen Description BLOOD RIGHT ANTECUBITAL  Final   Special Requests   Final    BOTTLES DRAWN AEROBIC AND ANAEROBIC Blood Culture adequate volume   Culture   Final    NO GROWTH < 12 HOURS Performed at [facility], [address], [place],  [address]    Report Status PENDING  Incomplete  Respiratory Panel by RT PCR (Flu A&B, Covid) - Nasopharyngeal Swab     Status: None   Collection Time: 02/06/20  2:08 AM   Specimen: Nasopharyngeal Swab  Result Value Ref Range Status   SARS Coronavirus 2 by RT PCR NEGATIVE NEGATIVE Final    Comment: (NOTE) SARS-CoV-2 target nucleic acids are NOT DETECTED. The SARS-CoV-2 RNA is generally detectable in upper respiratoy specimens during the acute phase of infection. The lowest concentration of SARS-CoV-2 viral copies this assay can detect is 131 copies/mL. A negative result does not preclude SARS-Cov-2 infection and should not be used as the sole basis for treatment or other patient management decisions. A negative result may occur with  improper specimen collection/handling, submission of specimen  other than nasopharyngeal swab, presence of viral mutation(s) within the areas targeted by this assay, and inadequate number of viral copies (<131 copies/mL). A negative result must be combined with clinical observations, patient history, and epidemiological information. The expected result is Negative. Fact Sheet for Patients:  PinkCheek.be Fact Sheet for Healthcare Providers:  GravelBags.it This test is not yet  ap proved or cleared by the [place] FDA and  has been authorized for detection and/or diagnosis of SARS-CoV-2 by FDA under an Emergency Use Authorization (EUA). This EUA will remain  in effect (meaning this test can be used) for the duration of the COVID-19 declaration under Section 564(b)(1) of the Act, 21 U.S.C. section 360bbb-3(b)(1), unless the authorization is terminated or revoked sooner.    Influenza A by PCR NEGATIVE NEGATIVE Final   Influenza B by PCR NEGATIVE NEGATIVE Final    Comment: (NOTE) The Xpert Xpress SARS-CoV-2/FLU/RSV assay is intended as an aid in  the diagnosis of influenza from Nasopharyngeal swab specimens and  should not be used as a sole basis for treatment. Nasal washings and  aspirates are unacceptable for Xpert Xpress SARS-CoV-2/FLU/RSV  testing. Fact Sheet for Patients: PinkCheek.be Fact Sheet for Healthcare Providers: GravelBags.it This test is not yet approved or cleared by the [place] FDA and  has been authorized for detection and/or diagnosis of SARS-CoV-2 by  FDA under an Emergency Use Authorization (EUA). This EUA will remain  in effect (meaning this test can be used) for the duration of the  Covid-19 declaration under Section 564(b)(1) of the Act, 21  U.S.C. section 360bbb-3(b)(1), unless the authorization is  terminated or revoked. Performed at [facility], [address] [place], [place] [address]     Radiology Reports CT Head Wo Contrast  Result Date: 02/05/2020 CLINICAL DATA:  Generalized weakness, intermittent confusion, history CHF, hypertension, diabetes mellitus, MI EXAM: CT HEAD WITHOUT CONTRAST TECHNIQUE: Contiguous axial images were obtained from the base of the skull through the vertex without intravenous contrast. Sagittal and coronal MPR images reconstructed from axial data set. COMPARISON:  11/18/2013 FINDINGS: Brain: Minimal atrophy for age. Normal  ventricular morphology. No midline shift or mass effect. Otherwise normal appearance of brain parenchyma. No intracranial hemorrhage, mass lesion, or evidence of acute infarction. No extra-axial fluid collections. Scattered motion artifacts. Vascular: No hyperdense vessels Skull: Intact Sinuses/Orbits: Clear Other: N/A IMPRESSION: No acute intracranial abnormalities. Electronically Signed   By: Lavonia Dana M.D.   On: 02/05/2020 23:17   CT Angio Chest PE W and/or Wo Contrast  Result Date: 02/06/2020 CLINICAL DATA:  Shortness of breath. EXAM: CT ANGIOGRAPHY CHEST WITH CONTRAST TECHNIQUE: Multidetector CT imaging of the chest was performed using the standard protocol during bolus administration of intravenous contrast. Multiplanar CT image reconstructions and MIPs were obtained to evaluate the vascular anatomy. CONTRAST:  12mL OMNIPAQUE IOHEXOL 350 MG/ML SOLN COMPARISON:  None. FINDINGS: Cardiovascular: Contrast injection is sufficient to demonstrate satisfactory opacification of the pulmonary arteries to the segmental level. There is no pulmonary embolus. The main pulmonary artery is within normal limits for size. There is no CT evidence of acute right heart strain. There are atherosclerotic changes of the thoracic aorta without evidence for dissection or aneurysm. Coronary artery calcifications are noted. Mediastinum/Nodes: --there are calcified mediastinal lymph nodes. There is slightly enlarged hilar lymph nodes, likely reactive. --No axillary lymphadenopathy. --No supraclavicular lymphadenopathy. --Normal thyroid gland. --The esophagus is unremarkable Lungs/Pleura: There is atelectasis at the lung bases. There is no pneumothorax. No large pleural effusion. The  trachea is unremarkable. Upper Abdomen: No acute abnormality. Musculoskeletal: No chest wall abnormality. No acute or significant osseous findings. There is mild anterior wedging of the T4 vertebral body, likely chronic. Review of the MIP images confirms  the above findings. IMPRESSION: 1. No evidence for acute pulmonary embolus. 2. Bibasilar atelectasis. 3. Aortic Atherosclerosis (ICD10-I70.0). Electronically Signed   By: Constance Holster M.D.   On: 02/06/2020 02:40   DG Chest Portable 1 View  Result Date: 02/05/2020 CLINICAL DATA:  Shortness of breath. Generalized weakness. Intermittent confusion. EXAM: PORTABLE CHEST 1 VIEW COMPARISON:  Radiograph 11/08/2013, CT 05/16/2011 FINDINGS: Lower lung volumes from prior exam. Chronic elevation of right hemidiaphragm. Borderline cardiomegaly with unchanged mediastinal contours. Calcified left hilar nodes. No acute airspace disease, pleural effusion or pneumothorax. No acute osseous abnormalities are seen. Chronic degenerative change of the shoulders. IMPRESSION: Low lung volumes without acute abnormality. Electronically Signed   By: Keith Rake M.D.   On: 02/05/2020 22:16    Time Spent in minutes 15   Dahmir Epperly M.D on 02/06/2020 at 12:12 PM  Between 7am to 7pm - Pager - [phone]  After 7pm go to [email or web address] - password [facility]  [facility] -  Office  [phone]

## 2020-02-06 NOTE — H&P (Signed)
History and Physical    [name] [id number] DOB: Mar 14, 1946 DOA: 02/05/2020  PCP: Remi Haggard, FNP   Patient coming from: home  I have personally briefly reviewed patient's old medical records in [place]  Chief Complaint: Generalized weakness, confusion  HPI: [name] is a 74 y.o. female with medical history significant for CAD with history of LAD stent 2019, anxiety, depression and chronic pain on several psychotropic agents, OSA intolerant of CPAP, recently started on a new pain medication per daughter who gives most of the history, who also has a history of diabetes, hypothyroidism, hypertension, HLD who was brought into the emergency room with generalized weakness.  EMS was apparently called out to the home on 3 occasions to help patient move from the bed to the commode but family did not agree to for patient to be transported to the hospital until third visit, per triage note.  On arrival of EMS patient had a low-grade temperature of 99.2, O2 sat 88% on room air and was tachycardic at 110.  Due to confusion, patient is unable to contribute to the history.  Spoke to daughter over the phone who says that patient has been very lethargic and drowsy for the past 3 days.  She did state that patient was started on a new pain medication the name of which is unclear.  ED Course: On arrival to the emergency room patient was lethargic and appeared confused.  She was afebrile, heart rate 104 O2 sat 94% on 2 L.  Blood pressure was initially 120/70 but apparently fell to 85/64, responding to IV fluid boluses.  Venous pH was normal at 7.27 with PCO2 of 47.  WBC WNL, lactic acid 2.4, labs mostly unremarkable.  Urinalysis unremarkable chest x-ray no acute disease, CT head negative, EKG sinus tach.  CTA chest pending at time of decision to admit Review of Systems: Unreliable due to the patient's confusion  Past Medical History:  Diagnosis Date  . Amnesia   . Anxiety   .  Arthritis   . Back pain   . CHF (congestive heart failure) ([place])   . Chronic pain syndrome   . Cystocele   . Depression   . Diabetes mellitus without complication ([place])   . GERD (gastroesophageal reflux disease)   . Hyperlipidemia   . Hypertension   . IBS (irritable bowel syndrome)   . Myocardial infarction ([place])   . Obesity   . Osteoporosis   . Osteoporosis   . Shortness of breath dyspnea   . Sleep apnea    not using now  . Thyroid nodule     Past Surgical History:  Procedure Laterality Date  . ABDOMINAL HYSTERECTOMY    . APPENDECTOMY    . CARDIAC CATHETERIZATION    . CHOLECYSTECTOMY    . CORONARY STENT INTERVENTION N/A 05/05/2018   Procedure: CORONARY STENT INTERVENTION;  Surgeon: Isaias Cowman, MD;  Location: [facility];  Service: Cardiovascular;  Laterality: N/A;  . FOOT SURGERY    . LEFT HEART CATH AND CORONARY ANGIOGRAPHY Left 05/05/2018   Procedure: LEFT HEART CATH AND CORONARY ANGIOGRAPHY;  Surgeon: Isaias Cowman, MD;  Location: [facility];  Service: Cardiovascular;  Laterality: Left;  . OOPHORECTOMY    . REPLACEMENT TOTAL KNEE    . TONSILLECTOMY       reports that she has never smoked. She has never used smokeless tobacco. She reports that she does not drink alcohol or use drugs.  No Known Allergies  Family History  Problem Relation Age of Onset  . Cancer Mother   . Cancer Father   . Cancer Sister   . Thyroid disease Neg Hx      Prior to Admission medications   Medication Sig Start Date End Date Taking? Authorizing Provider  busPIRone (BUSPAR) 30 MG tablet Take 30 mg by mouth 2 (two) times daily.    [provider]  cefpodoxime (VANTIN) 200 MG tablet Take 200 mg by mouth 2 (two) times daily.    [provider]  Cholecalciferol (VITAMIN D3) 1000 units CAPS Take 1,000 Units by mouth daily.     [provider]  clonazePAM (KLONOPIN) 0.5 MG tablet Take 0.5 mg by mouth daily.  02/17/17   [provider]  cyclobenzaprine (FLEXERIL) 5 MG tablet Take 5 mg by mouth daily.     [provider]  doxepin (SINEQUAN) 10 MG capsule Take 10 mg by mouth at bedtime.     [provider]  gabapentin (NEURONTIN) 300 MG capsule Take 300 mg by mouth 3 (three) times daily. 03/06/17   [provider]  LANTUS SOLOSTAR 100 UNIT/ML Solostar Pen Inject 72 Units into the skin every evening.  12/04/16   [provider]  levothyroxine (SYNTHROID, LEVOTHROID) 75 MCG tablet Take 75 mcg by mouth daily before breakfast.  08/27/16   [provider]  lisinopril (PRINIVIL,ZESTRIL) 40 MG tablet Take 40 mg by mouth.    [provider]  metFORMIN (GLUCOPHAGE) 500 MG tablet Take 500 mg by mouth 2 (two) times daily with a meal.  07/24/16   [provider]  metoprolol succinate (TOPROL-XL) 50 MG 24 hr tablet TAKE 50 MG TOTAL BY MOUTH ONCE DAILY. 07/24/16   [provider]  nystatin (MYCOSTATIN) powder Apply 1 Bottle topically 2 (two) times daily. Breast/stomach folds 08/04/14   [provider]  pioglitazone (ACTOS) 45 MG tablet Take 45 mg by mouth daily. 02/02/15   [provider]  simvastatin (ZOCOR) 40 MG tablet Take 40 mg by mouth at bedtime.  07/24/16   [provider]  tolterodine (DETROL LA) 4 MG 24 hr capsule Take 1 capsule (4 mg total) by mouth daily. Start with 1/2 tablet daily x 1 week, then increase to whole tablet if needed. Patient not taking: Reported on 09/17/2016 10/03/15   Rubie Maid, MD  topiramate (TOPAMAX) 50 MG tablet Take 50 mg by mouth daily.    [provider]    Physical Exam: Vitals:   02/05/20 2119 02/05/20 2121 02/05/20 2330 02/06/20 0100  BP: 122/70  (!) 105/58 (!) 110/51  Pulse: (!) 104  (!) 104 96  Resp: 17  16 17   Temp: 98.3 F (36.8 C)     SpO2: 94%  93% 96%  Weight:  127 kg    Height:  5\' 3"  (1.6 m)       Vitals:   02/05/20 2119 02/05/20 2121 02/05/20 2330 02/06/20 0100    BP: 122/70  (!) 105/58 (!) 110/51  Pulse: (!) 104  (!) 104 96  Resp: 17  16 17   Temp: 98.3 F (36.8 C)     SpO2: 94%  93% 96%  Weight:  127 kg    Height:  5\' 3"  (1.6 m)      Constitutional: Somnolent but will awaken and answer questions.  She is oriented to person only not place and time.. Eyes: PERLA, EOMI, irises appear normal, anicteric sclera,  ENMT: external ears and nose appear normal, normal hearing  Lips appears normal, oropharynx mucosa, tongue, posterior pharynx appear normal  Neck: neck appears normal, no masses, normal ROM, no thyromegaly, no JVD  CVS: S1-S2 clear, no murmur rubs or gallops,  , no carotid bruits, pedal pulses palpable, 2+ LE edema Respiratory: Diminished at bases, no wheezing, rales or rhonchi. Respiratory effort normal. No accessory muscle use.  Abdomen: soft nontender, nondistended, normal bowel sounds, no hepatosplenomegaly, no hernias Musculoskeletal: : no cyanosis, clubbing , no contractures or atrophy Neuro: Cranial nerves II-XII intact, sensation, reflexes normal, strength Psych: Unable to assess due to confusion  skin: no rashes or lesions or ulcers, no induration or nodules   Labs on Admission: I have personally reviewed following labs and imaging studies  CBC: Recent Labs  Lab 02/05/20 2153  WBC 6.8  HGB 11.1*  HCT 36.3  MCV 92.8  PLT 0000000*   Basic Metabolic Panel: Recent Labs  Lab 02/05/20 2153  NA 139  K 4.0  CL 109  CO2 21*  GLUCOSE 165*  BUN 20  CREATININE 0.98  CALCIUM 8.5*   GFR: Estimated Creatinine Clearance: 67.3 mL/min (by C-G formula based on SCr of 0.98 mg/dL). Liver Function Tests: Recent Labs  Lab 02/05/20 2156  AST 57*  ALT 26  ALKPHOS 52  BILITOT 0.8  PROT 6.5  ALBUMIN 3.6   No results for input(s): LIPASE, AMYLASE in the last 168 hours. No results for input(s): AMMONIA in the last 168 hours. Coagulation Profile: No results for input(s): INR, PROTIME in the last 168 hours. Cardiac  Enzymes: No results for input(s): CKTOTAL, CKMB, CKMBINDEX, TROPONINI in the last 168 hours. BNP (last 3 results) No results for input(s): PROBNP in the last 8760 hours. HbA1C: No results for input(s): HGBA1C in the last 72 hours. CBG: No results for input(s): GLUCAP in the last 168 hours. Lipid Profile: No results for input(s): CHOL, HDL, LDLCALC, TRIG, CHOLHDL, LDLDIRECT in the last 72 hours. Thyroid Function Tests: No results for input(s): TSH, T4TOTAL, FREET4, T3FREE, THYROIDAB in the last 72 hours. Anemia Panel: No results for input(s): VITAMINB12, FOLATE, FERRITIN, TIBC, IRON, RETICCTPCT in the last 72 hours. Urine analysis:    Component Value Date/Time   COLORURINE AMBER (A) 02/06/2020 0005   APPEARANCEUR HAZY (A) 02/06/2020 0005   APPEARANCEUR Cloudy (A) 07/26/2016 1119   LABSPEC 1.023 02/06/2020 0005   LABSPEC 1.024 01/30/2014 0026   PHURINE 5.0 02/06/2020 0005   GLUCOSEU NEGATIVE 02/06/2020 0005   GLUCOSEU >=500 01/30/2014 0026   HGBUR NEGATIVE 02/06/2020 0005   BILIRUBINUR SMALL (A) 02/06/2020 0005   BILIRUBINUR Negative 07/26/2016 1119   BILIRUBINUR Negative 01/30/2014 0026   KETONESUR NEGATIVE 02/06/2020 0005   PROTEINUR 30 (A) 02/06/2020 0005   UROBILINOGEN 1.0 07/11/2008 1245   NITRITE NEGATIVE 02/06/2020 0005   LEUKOCYTESUR NEGATIVE 02/06/2020 0005   LEUKOCYTESUR Trace 01/30/2014 0026    Radiological Exams on Admission: CT Head Wo Contrast  Result Date: 02/05/2020 CLINICAL DATA:  Generalized weakness, intermittent confusion, history CHF, hypertension, diabetes mellitus, MI EXAM: CT HEAD WITHOUT CONTRAST TECHNIQUE: Contiguous axial images were obtained from the base of the skull through the vertex without intravenous contrast. Sagittal and coronal MPR images reconstructed from axial data set. COMPARISON:  11/18/2013 FINDINGS: Brain: Minimal atrophy for age. Normal ventricular morphology. No midline shift or mass effect. Otherwise normal appearance of brain  parenchyma. No intracranial hemorrhage, mass lesion, or evidence of acute infarction. No extra-axial fluid collections. Scattered motion artifacts. Vascular: No hyperdense vessels Skull: Intact Sinuses/Orbits: Clear Other: N/A IMPRESSION: No  acute intracranial abnormalities. Electronically Signed   By: Lavonia Dana M.D.   On: 02/05/2020 23:17   DG Chest Portable 1 View  Result Date: 02/05/2020 CLINICAL DATA:  Shortness of breath. Generalized weakness. Intermittent confusion. EXAM: PORTABLE CHEST 1 VIEW COMPARISON:  Radiograph 11/08/2013, CT 05/16/2011 FINDINGS: Lower lung volumes from prior exam. Chronic elevation of right hemidiaphragm. Borderline cardiomegaly with unchanged mediastinal contours. Calcified left hilar nodes. No acute airspace disease, pleural effusion or pneumothorax. No acute osseous abnormalities are seen. Chronic degenerative change of the shoulders. IMPRESSION: Low lung volumes without acute abnormality. Electronically Signed   By: Keith Rake M.D.   On: 02/05/2020 22:16    EKG: Independently reviewed.   Assessment/Plan Principal Problem:   Acute metabolic encephalopathy -Etiology uncertain.  Concern for medication related encephalopathy, hypoxia, possible infection/sepsis -CT head was negative -Neurologic checks -Hold psychotropic agents, BuSpar , Klonopin, Flexeril, Sinequan, Neurontin, Topamax -Fall and aspiration precautions    Acute respiratory failure with hypoxia (HCC)   Possible pneumonia   Lactic acidosis  -Patient reported a cough for the past 3 days that is nonproductive but she is afebrile and white cell count is normal and chest x-ray shows no acute disease -Lactic acidosis of 2.4 without other sepsis criteria.  She is afebrile, no leukocytosis -We will continue empiric Rocephin and azithromycin started in the emergency room -We will continue to monitor closely for development of sepsis criteria -Follow-up CTA chest to evaluate for PE and also for  pneumonia not seen on chest x-ray -   Anxiety and depression   Chronic pain syndrome -Holding home meds for now due to altered mental status    Diabetes mellitus with complication ([place]) -Sliding scale coverage med rec    Essential hypertension -Hold home antihypertensives due to soft blood pressure in the emergency room    CAD (coronary artery disease) -History of DES to proximal LAD in 2019 -Troponin added on. -No chest pain and EKG shows no acute findings    OSA (obstructive sleep apnea)   CPAP ventilation treatment not tolerated -Patient states she could not tolerate the mask -   Morbid obesity with BMI of 45.0-49.9, adult ([place]) -This complicates overall prognosis and care     DVT prophylaxis: Lovenox  Code Status: DNR  Family Communication:  daughter  Disposition Plan: Back to previous home environment Consults called: none  Status:inp    Athena Masse MD [facility]     02/06/2020, 1:18 AM

## 2020-02-06 NOTE — Progress Notes (Signed)
PHARMACIST - PHYSICIAN COMMUNICATION  CONCERNING:  Enoxaparin (Lovenox) for DVT Prophylaxis    RECOMMENDATION: Patient was prescribed enoxaprin 40mg  q24 hours for VTE prophylaxis.   Filed Weights   02/05/20 2121  Weight: 127 kg (280 lb)    Body mass index is 49.6 kg/m.  Estimated Creatinine Clearance: 67.3 mL/min (by C-G formula based on SCr of 0.98 mg/dL).  Based on [place] patient is candidate for enoxaparin 40mg  every 12 hour dosing due to BMI being >40.   DESCRIPTION: Pharmacy has adjusted enoxaparin dose per [facility] policy.  Patient is now receiving enoxaparin 40mg  every 12 hours.    Ena Dawley, PharmD Clinical Pharmacist  02/06/2020 1:25 AM

## 2020-02-06 NOTE — Plan of Care (Signed)

## 2020-02-07 LAB — GLUCOSE, CAPILLARY
Glucose-Capillary: 142 mg/dL — ABNORMAL HIGH (ref 70–99)
Glucose-Capillary: 171 mg/dL — ABNORMAL HIGH (ref 70–99)

## 2020-02-07 LAB — URINE CULTURE: Culture: NO GROWTH

## 2020-02-07 MED ORDER — DOXEPIN HCL 10 MG PO CAPS: 10.0000 mg | ORAL_CAPSULE | Freq: Every day | ORAL | 0 refills | Status: DC

## 2020-02-07 MED ORDER — AZITHROMYCIN 500 MG PO TABS: 500.0000 mg | ORAL_TABLET | Freq: Every day | ORAL | Status: DC

## 2020-02-07 MED ORDER — TOPIRAMATE 50 MG PO TABS: 50.0000 mg | ORAL_TABLET | Freq: Every day | ORAL | 0 refills | Status: DC

## 2020-02-07 MED ORDER — LEVOTHYROXINE SODIUM 75 MCG PO TABS: 75.0000 ug | ORAL_TABLET | Freq: Every day | ORAL | 1 refills | Status: DC

## 2020-02-07 MED ORDER — CYCLOBENZAPRINE HCL 5 MG PO TABS: 5.0000 mg | ORAL_TABLET | Freq: Every day | ORAL | 0 refills | Status: DC

## 2020-02-07 MED ORDER — AZITHROMYCIN 500 MG PO TABS: 500.0000 mg | ORAL_TABLET | Freq: Every day | ORAL | 0 refills | Status: AC

## 2020-02-07 MED ORDER — GABAPENTIN 300 MG PO CAPS: 300.0000 mg | ORAL_CAPSULE | Freq: Three times a day (TID) | ORAL | 0 refills | Status: DC

## 2020-02-07 MED ORDER — LISINOPRIL 40 MG PO TABS: 40.0000 mg | ORAL_TABLET | Freq: Every day | ORAL | 0 refills | Status: DC

## 2020-02-07 MED ORDER — CLONAZEPAM 0.5 MG PO TABS: 0.5000 mg | ORAL_TABLET | Freq: Every day | ORAL | 0 refills | Status: DC

## 2020-02-07 NOTE — Plan of Care (Signed)
Patient discharged home per MD order. All discharge instructions given to patient and daughter and all questions answered. 

## 2020-02-07 NOTE — Progress Notes (Signed)
PHARMACIST - PHYSICIAN COMMUNICATION DR:   Reesa Chew CONCERNING: Antibiotic IV to Oral Route Change Policy  RECOMMENDATION: This patient is receiving azithromycin by the intravenous route.  Based on criteria approved by the Pharmacy and Therapeutics Committee, the antibiotic(s) is/are being converted to the equivalent oral dose form(s).   DESCRIPTION: These criteria include:  Patient being treated for a respiratory tract infection, urinary tract infection, cellulitis or clostridium difficile associated diarrhea if on metronidazole  The patient is not neutropenic and does not exhibit a GI malabsorption state  The patient is eating (either orally or via tube) and/or has been taking other orally administered medications for a least 24 hours  The patient is improving clinically and has a Tmax < 100.5  If you have questions about this conversion, please contact the [place], PharmD, BCPS Clinical Pharmacist 02/07/2020 11:46 AM

## 2020-02-07 NOTE — TOC Transition Note (Signed)
Transition of Care [facility]) - CM/SW Discharge Note   Patient Details  Name: [name] MRN: [id number] Date of Birth: [birth year]  [facility] CM/SW Contact:  Elease Hashimoto, LCSW Phone Number: 02/07/2020, 11:35 AM   Clinical Narrative:  Met with pt who reports she lives with partner and daughter part time. Someone is always there with her and can assist if needed. She was told by MD she is going home today after lunch. She has not gotten up and moved yet since admission. MD has ordered PT to see how well she moves her rollator is here in her room. She was independent with this prior to admission and has partner drive her to her appointments. She has a PCP and has no other concerns. She feels better today and feels ready to go home. She has all of the needed equipment for home, and unless PT wants follow has no needs.        Barriers to Discharge: No Barriers Identified   Patient Goals and CMS Choice Patient states their goals for this hospitalization and ongoing recovery are:: I plan to go home today.      Discharge Placement                       Discharge Plan and Services In-house Referral: Clinical Social Work                                   Social Determinants of Health (SDOH) Interventions     Readmission Risk Interventions No flowsheet data found.

## 2020-02-07 NOTE — TOC Progression Note (Signed)
Transition of Care [facility]) - Progression Note    Patient Details  Name: [name] MRN: [id number] Date of Birth: 15-Mar-1946  [facility]) CM/SW Contact  Lowen Mansouri, Gardiner Rhyme, LCSW Phone Number: 02/07/2020, 3:07 PM  Clinical Narrative:  PT recommends HHPT and have spoken with pt and she is agreeable but wants to check with her partner. Discussed for this worker to set it up and she can decide due to going home today and want to have in place for when she goes home. Have contacted [facility] for PT order. He will follow up with pt. Pt feels ready to go home.     Expected Discharge Plan: Home/Self Care Barriers to Discharge: No Barriers Identified  Expected Discharge Plan and Services Expected Discharge Plan: Home/Self Care In-house Referral: Clinical Social Work     Living arrangements for the past 2 months: Single Family Home Expected Discharge Date: 02/07/20                                     Social Determinants of Health (SDOH) Interventions    Readmission Risk Interventions No flowsheet data found.

## 2020-02-07 NOTE — Evaluation (Signed)
Physical Therapy Evaluation Patient Details Name: [name] MRN: [id number] DOB: [birth year] Today's Date: 02/07/2020   History of Present Illness  presented to ER secondary to generalized weakness, persistent lethargy and inability to manage care in home environment; admitted for management fo acute metabolic encephalopathy, likely medication related per chart.  Clinical Impression  Upon evaluation, patient alert and oriented to self, location and general situation; follows simple commands, but mildly confused to more complex information.  Bilat UE/LE strength and ROM grossly symmetrical and WFL; no focal weakness, sensory or coordination deficit appreciated.  Able to complete bed mobility with cga/close sup; sit/stand, basic transfers and gait (25-50') with 4WRW, cga/min assist.  Demonstrates forward flexed posture, short choppy steps with decreased cadence and overall gait speed; limited balance reactions evident.  Mild SOB with exertion, maintaining sats 89-90% on RA with exertion.  Min cuing for pursed lip breathing with gait efforts. Would benefit from skilled PT to address above deficits and promote optimal return to PLOF.; Recommend transition to HHPT upon discharge from acute hospitalization.     Follow Up Recommendations Home health PT(HHOT)    Equipment Recommendations       Recommendations for Other Services       Precautions / Restrictions Precautions Precautions: Fall Restrictions Weight Bearing Restrictions: No      Mobility  Bed Mobility Overal bed mobility: Needs Assistance Bed Mobility: Supine to Sit     Supine to sit: Supervision     General bed mobility comments: min cuing for technique  Transfers Overall transfer level: Needs assistance   Transfers: Sit to/from Stand Sit to Stand: Min guard         General transfer comment: cuing for hand placement  Ambulation/Gait Ambulation/Gait assistance: Min guard Gait Distance (Feet): (25' x2, 50'  x1) Assistive device: 4-wheeled walker       General Gait Details: forward flexed posture, short choppy steps with decreased cadence and overall gait speed; limited balance reactions evident.  Mild SOB with exertion, maintaining sats 89-90% on RA with exertion.  Min cuing for pursed lip breathing with gait efforts.  Stairs            Wheelchair Mobility    Modified Rankin (Stroke Patients Only)       Balance Overall balance assessment: Needs assistance Sitting-balance support: No upper extremity supported;Feet supported Sitting balance-Leahy Scale: Good     Standing balance support: Bilateral upper extremity supported Standing balance-Leahy Scale: Fair                               Pertinent Vitals/Pain Pain Assessment: No/denies pain    Home Living Family/patient expects to be discharged to:: Private residence Living Arrangements: (lives with house-mate and daughter) Available Help at Discharge: Available 24 hours/day Type of Home: Apartment Home Access: Ramped entrance     Home Layout: One level Home Equipment: Walker - 4 wheels      Prior Function Level of Independence: Independent with assistive device(s)         Comments: Mod indep with 4WRW for ADLs, household and limited community mobilization; denies fall history in previous 3 months; no home O2.     Hand Dominance   Dominant Hand: Right    Extremity/Trunk Assessment   Upper Extremity Assessment Upper Extremity Assessment: Overall WFL for tasks assessed    Lower Extremity Assessment Lower Extremity Assessment: Overall WFL for tasks assessed(grossly at least 4-/5 throughotu)  Communication   Communication: No difficulties  Cognition Arousal/Alertness: Awake/alert Behavior During Therapy: WFL for tasks assessed/performed                                   General Comments: oriented to self, location and general situation; still mildly confused to date  and more complex information; easily distractible by external environment      General Comments      Exercises Other Exercises Other Exercises: Educated in pursed lip breathing strategies, energy conservation and activity pacing techniques, home safety modifications at discharge; patient voiced understanding of all information.   Assessment/Plan    PT Assessment Patient needs continued PT services  PT Problem List Decreased strength;Decreased activity tolerance;Decreased balance;Decreased mobility;Decreased knowledge of use of DME;Decreased cognition;Decreased safety awareness;Decreased knowledge of precautions;Obesity;Cardiopulmonary status limiting activity       PT Treatment Interventions Gait training;Functional mobility training;Therapeutic activities;Therapeutic exercise;DME instruction;Balance training;Cognitive remediation;Patient/family education    PT Goals (Current goals can be found in the Care Plan section)  Acute Rehab PT Goals Patient Stated Goal: to return home! PT Goal Formulation: With patient Time For Goal Achievement: 02/21/20 Potential to Achieve Goals: Good    Frequency Min 2X/week   Barriers to discharge        Co-evaluation               AM-PAC PT "6 Clicks" Mobility  Outcome Measure Help needed turning from your back to your side while in a flat bed without using bedrails?: None Help needed moving from lying on your back to sitting on the side of a flat bed without using bedrails?: A Little Help needed moving to and from a bed to a chair (including a wheelchair)?: A Little Help needed standing up from a chair using your arms (e.g., wheelchair or bedside chair)?: A Little Help needed to walk in hospital room?: A Little Help needed climbing 3-5 steps with a railing? : A Little 6 Click Score: 19    End of Session Equipment Utilized During Treatment: Gait belt Activity Tolerance: Patient tolerated treatment well Patient left: in chair;with  call bell/phone within reach;with chair alarm set Nurse Communication: Mobility status PT Visit Diagnosis: Muscle weakness (generalized) (M62.81);Difficulty in walking, not elsewhere classified (R26.2)    Time: [id number] PT Time Calculation (min) (ACUTE ONLY): 35 min   Charges:   PT Evaluation $PT Eval Moderate Complexity: 1 Mod PT Treatments $Therapeutic Activity: 8-22 mins        Nathanal Hermiz H. Owens Shark, PT, DPT, NCS 02/07/20, 4:46 PM [phone]

## 2020-02-07 NOTE — Discharge Summary (Signed)
Physician Discharge Summary  [name] [id number] DOB: 11/29/1945 DOA: 02/05/2020  PCP: Remi Haggard, FNP  Admit date: 02/05/2020 Discharge date: 02/07/2020  Admitted From: Home Disposition: Home  Recommendations for Outpatient Follow-up:  1. Follow up with PCP in 1-2 weeks 2. Follow-up with pain management. 3. Please obtain BMP/CBC in one week 4. Please follow up on the following pending results: None  Home Health: Yes Equipment/Devices: None Discharge Condition: Fair CODE STATUS: DNR Diet recommendation: Heart Healthy / Carb Modified   Brief/Interim Summary: 74 year old morbidly obese female with CAD s/p LAD stent in 2019, anxiety, depression on multiple psychotropic medications, OSA intolerant to CPAP, hypothyroidism, hypertension chronic back pain on long-term oxycodone, recently started on buprenorphine (few days back) brought to the ED with generalized weakness with drowsiness and lethargy for past 2-3 days.  Reported some nonproductive cough.  EMS found her to have temperature of 90 4F, O2 sat of 82% room air and tachycardic to 110s. In the ED patient was lethargic and confused.  Sats stable on 2 L nasal cannula.  Blood pressure dropped to 85/64 in the ED and received IV fluid bolus.  Blood work showed venous ABG with pH 7.27 and PCO2 47. Lactic acid elevated at 2.41 WBC and chemistry was normal. Head CT unremarkable and EKG showed sinus tachycardia only.  CT angiogram of the chest negative for PE and showed bibasilar atelectasis.  Admitted for further management and concern for AMS secondary to new narcotic use and possible underlying pneumonia.  Patient remained afebrile, no leukocytosis so less concerning for pneumonia.  She was given a 3-day course of azithromycin.  Urine and blood culture remain negative.  Her mentation improved next day by holding buprenorphine and 2 other home meds.  According to patient she was taking rest of her meds for a long  time. Buprenorphine was discontinued and we decreased the dose of some of her other medications and she was advised to follow-up with her pain management physician for further management. Sirs response was most likely secondary to hypoxia with decreased respiratory drive secondary to multiple sedative medications.  She will continue rest of her medications with decreased dose and follow-up with pain management clinic.  Patient is morbidly obese which also guard her prognosis.  Discharge Diagnoses:  Principal Problem:   Acute metabolic encephalopathy Active Problems:   Anxiety and depression   Chronic pain syndrome   Diabetes mellitus with complication (HCC)   Essential hypertension   CAD (coronary artery disease)   Acute respiratory failure with hypoxia (HCC)   OSA (obstructive sleep apnea)   CPAP ventilation treatment not tolerated   Morbid obesity with BMI of 45.0-49.9, adult (HCC)   Lactic acidosis   possible Sepsis ([place])   Polypharmacy  Discharge Instructions  Discharge Instructions    Diet - low sodium heart healthy   Complete by: As directed    Discharge instructions   Complete by: As directed    It was pleasure taking care of you. You are on multiple medications for pain which can cause altered mental status.  We discontinued the buprenorphine as it was the most recent one.  We also decreased the doses of few others, please take it as directed and follow-up with your pain management physician for further recommendations.   Increase activity slowly   Complete by: As directed      Allergies as of 02/07/2020   No Known Allergies     Medication List    STOP taking these medications  Buprenorphine HCl 300 MCG Film   gabapentin 600 MG tablet Commonly known as: NEURONTIN Replaced by: gabapentin 300 MG capsule     TAKE these medications   albuterol 108 (90 Base) MCG/ACT inhaler Commonly known as: VENTOLIN HFA Inhale 2 puffs into the lungs every 4 (four) hours as  needed for shortness of breath or wheezing.   aspirin EC 81 MG tablet Take 81 mg by mouth daily.   atorvastatin 40 MG tablet Commonly known as: LIPITOR Take 40 mg by mouth at bedtime.   azithromycin 500 MG tablet Commonly known as: ZITHROMAX Take 1 tablet (500 mg total) by mouth daily for 3 days. Start taking on: February 08, 2020   busPIRone 30 MG tablet Commonly known as: BUSPAR Take 30 mg by mouth 2 (two) times daily.   clonazePAM 0.5 MG tablet Commonly known as: KLONOPIN Take 1 tablet (0.5 mg total) by mouth daily.   clopidogrel 75 MG tablet Commonly known as: PLAVIX Take 75 mg by mouth daily.   cyclobenzaprine 5 MG tablet Commonly known as: FLEXERIL Take 1 tablet (5 mg total) by mouth daily. Start taking on: February 08, 2020 What changed:   medication strength  how much to take  when to take this  reasons to take this   doxepin 10 MG capsule Commonly known as: SINEQUAN Take 1 capsule (10 mg total) by mouth at bedtime.   DULoxetine 60 MG capsule Commonly known as: CYMBALTA Take 60 mg by mouth daily.   fluticasone 50 MCG/ACT nasal spray Commonly known as: FLONASE Place 2 sprays into both nostrils daily.   furosemide 20 MG tablet Commonly known as: LASIX Take 20 mg by mouth 2 (two) times daily.   gabapentin 300 MG capsule Commonly known as: NEURONTIN Take 1 capsule (300 mg total) by mouth 3 (three) times daily. Replaces: gabapentin 600 MG tablet   Lantus SoloStar 100 UNIT/ML Solostar Pen Generic drug: insulin glargine Inject 75 Units into the skin at bedtime.   levothyroxine 75 MCG tablet Commonly known as: SYNTHROID Take 1 tablet (75 mcg total) by mouth daily before breakfast. Start taking on: February 08, 2020 What changed:   medication strength  how much to take   Linzess 72 MCG capsule Generic drug: linaclotide Take 72 mcg by mouth daily before breakfast.   lisinopril 40 MG tablet Commonly known as: ZESTRIL Take 1 tablet (40 mg total) by  mouth daily. Start taking on: February 08, 2020 What changed: when to take this   metFORMIN 500 MG 24 hr tablet Commonly known as: GLUCOPHAGE-XR Take 1,000 mg by mouth 2 (two) times daily.   metoprolol succinate 50 MG 24 hr tablet Commonly known as: TOPROL-XL Take 50 mg by mouth 2 (two) times daily.   montelukast 10 MG tablet Commonly known as: SINGULAIR Take 10 mg by mouth daily.   Namenda XR 28 MG Cp24 24 hr capsule Generic drug: memantine Take 28 mg by mouth daily.   nystatin powder Commonly known as: MYCOSTATIN/NYSTOP Apply 1 application topically 2 (two) times daily as needed (rashy areas in skin folds).   ondansetron 4 MG tablet Commonly known as: ZOFRAN Take 4 mg by mouth every 8 (eight) hours as needed for nausea/vomiting.   Oxycodone HCl 10 MG Tabs Take 10 mg by mouth 4 (four) times daily as needed for severe pain.   pantoprazole 40 MG tablet Commonly known as: PROTONIX Take 40 mg by mouth daily.   pioglitazone 45 MG tablet Commonly known as: ACTOS Take 45 mg by  mouth daily.   QUEtiapine 200 MG tablet Commonly known as: SEROQUEL Take 100-200 mg by mouth at bedtime.   topiramate 50 MG tablet Commonly known as: TOPAMAX Take 1 tablet (50 mg total) by mouth daily. Start taking on: February 08, 2020 What changed:   medication strength  how much to take   Viibryd 40 MG Tabs Generic drug: Vilazodone HCl Take 40 mg by mouth daily.   Vitamin D3 25 MCG (1000 UT) Caps Take 1,000 Units by mouth daily.   zolpidem 10 MG tablet Commonly known as: AMBIEN Take 10 mg by mouth at bedtime as needed for sleep.       No Known Allergies  Consultations:  None  Procedures/Studies: CT Head Wo Contrast  Result Date: 02/05/2020 CLINICAL DATA:  Generalized weakness, intermittent confusion, history CHF, hypertension, diabetes mellitus, MI EXAM: CT HEAD WITHOUT CONTRAST TECHNIQUE: Contiguous axial images were obtained from the base of the skull through the vertex without  intravenous contrast. Sagittal and coronal MPR images reconstructed from axial data set. COMPARISON:  11/18/2013 FINDINGS: Brain: Minimal atrophy for age. Normal ventricular morphology. No midline shift or mass effect. Otherwise normal appearance of brain parenchyma. No intracranial hemorrhage, mass lesion, or evidence of acute infarction. No extra-axial fluid collections. Scattered motion artifacts. Vascular: No hyperdense vessels Skull: Intact Sinuses/Orbits: Clear Other: N/A IMPRESSION: No acute intracranial abnormalities. Electronically Signed   By: Lavonia Dana M.D.   On: 02/05/2020 23:17   CT Angio Chest PE W and/or Wo Contrast  Result Date: 02/06/2020 CLINICAL DATA:  Shortness of breath. EXAM: CT ANGIOGRAPHY CHEST WITH CONTRAST TECHNIQUE: Multidetector CT imaging of the chest was performed using the standard protocol during bolus administration of intravenous contrast. Multiplanar CT image reconstructions and MIPs were obtained to evaluate the vascular anatomy. CONTRAST:  117mL OMNIPAQUE IOHEXOL 350 MG/ML SOLN COMPARISON:  None. FINDINGS: Cardiovascular: Contrast injection is sufficient to demonstrate satisfactory opacification of the pulmonary arteries to the segmental level. There is no pulmonary embolus. The main pulmonary artery is within normal limits for size. There is no CT evidence of acute right heart strain. There are atherosclerotic changes of the thoracic aorta without evidence for dissection or aneurysm. Coronary artery calcifications are noted. Mediastinum/Nodes: --there are calcified mediastinal lymph nodes. There is slightly enlarged hilar lymph nodes, likely reactive. --No axillary lymphadenopathy. --No supraclavicular lymphadenopathy. --Normal thyroid gland. --The esophagus is unremarkable Lungs/Pleura: There is atelectasis at the lung bases. There is no pneumothorax. No large pleural effusion. The trachea is unremarkable. Upper Abdomen: No acute abnormality. Musculoskeletal: No chest wall  abnormality. No acute or significant osseous findings. There is mild anterior wedging of the T4 vertebral body, likely chronic. Review of the MIP images confirms the above findings. IMPRESSION: 1. No evidence for acute pulmonary embolus. 2. Bibasilar atelectasis. 3. Aortic Atherosclerosis (ICD10-I70.0). Electronically Signed   By: Constance Holster M.D.   On: 02/06/2020 02:40   DG Chest Portable 1 View  Result Date: 02/05/2020 CLINICAL DATA:  Shortness of breath. Generalized weakness. Intermittent confusion. EXAM: PORTABLE CHEST 1 VIEW COMPARISON:  Radiograph 11/08/2013, CT 05/16/2011 FINDINGS: Lower lung volumes from prior exam. Chronic elevation of right hemidiaphragm. Borderline cardiomegaly with unchanged mediastinal contours. Calcified left hilar nodes. No acute airspace disease, pleural effusion or pneumothorax. No acute osseous abnormalities are seen. Chronic degenerative change of the shoulders. IMPRESSION: Low lung volumes without acute abnormality. Electronically Signed   By: Keith Rake M.D.   On: 02/05/2020 22:16     Subjective: Patient was feeling better when seen  today.  She wants to go home.  She was alert and oriented x4.  Discharge Exam: Vitals:   02/07/20 0025 02/07/20 0745  BP:  129/61  Pulse: 81 87  Resp:    Temp:  98.4 F (36.9 C)  SpO2: 90% 93%   Vitals:   02/06/20 2207 02/07/20 0024 02/07/20 0025 02/07/20 0745  BP: (!) 118/58 (!) 116/56  129/61  Pulse: 85 84 81 87  Resp: 16 18    Temp: 98.9 F (37.2 C) 98.4 F (36.9 C)  98.4 F (36.9 C)  TempSrc: Oral Oral  Oral  SpO2: 91% (!) 89% 90% 93%  Weight:      Height:        General: Pt is alert, awake, not in acute distress Cardiovascular: RRR, S1/S2 +, no rubs, no gallops Respiratory: CTA bilaterally, no wheezing, no rhonchi Abdominal: Soft, NT, ND, bowel sounds + Extremities: Trace LE edema, no cyanosis   The results of significant diagnostics from this hospitalization (including imaging,  microbiology, ancillary and laboratory) are listed below for reference.    Microbiology: Recent Results (from the past 240 hour(s))  Blood culture (routine x 2)     Status: None (Preliminary result)   Collection Time: 02/05/20 10:18 PM   Specimen: BLOOD  Result Value Ref Range Status   Specimen Description BLOOD BLOOD RIGHT FOREARM  Final   Special Requests   Final    BOTTLES DRAWN AEROBIC AND ANAEROBIC Blood Culture results may not be optimal due to an inadequate volume of blood received in culture bottles   Culture   Final    NO GROWTH 2 DAYS Performed at [facility], [address], [place], [place] [address]    Report Status PENDING  Incomplete  Blood culture (routine x 2)     Status: None (Preliminary result)   Collection Time: 02/05/20 10:19 PM   Specimen: BLOOD  Result Value Ref Range Status   Specimen Description BLOOD RIGHT ANTECUBITAL  Final   Special Requests   Final    BOTTLES DRAWN AEROBIC AND ANAEROBIC Blood Culture adequate volume   Culture   Final    NO GROWTH 2 DAYS Performed at [facility], [address], [place], [place] [address]    Report Status PENDING  Incomplete  Urine culture     Status: None   Collection Time: 02/06/20 12:05 AM   Specimen: Urine, Random  Result Value Ref Range Status   Specimen Description   Final    URINE, RANDOM Performed at [facility], [address] [place], [place] [address]    Special Requests   Final    NONE Performed at [facility], [address] [place], [place] [address]    Culture   Final    NO GROWTH Performed at [facility], [place] [address] [place], [place] [address]    Report Status 02/07/2020 FINAL  Final  Respiratory Panel by RT PCR (Flu A&B, Covid) - Nasopharyngeal Swab     Status: None   Collection Time: 02/06/20  2:08 AM   Specimen: Nasopharyngeal Swab  Result Value Ref Range Status   SARS Coronavirus 2 by RT PCR NEGATIVE NEGATIVE Final    Comment:  (NOTE) SARS-CoV-2 target nucleic acids are NOT DETECTED. The SARS-CoV-2 RNA is generally detectable in upper respiratoy specimens during the acute phase of infection. The lowest concentration of SARS-CoV-2 viral copies this assay can detect is 131 copies/mL. A negative result does not preclude SARS-Cov-2 infection  and should not be used as the sole basis for treatment or other patient management decisions. A negative result may occur with  improper specimen collection/handling, submission of specimen other than nasopharyngeal swab, presence of viral mutation(s) within the areas targeted by this assay, and inadequate number of viral copies (<131 copies/mL). A negative result must be combined with clinical observations, patient history, and epidemiological information. The expected result is Negative. Fact Sheet for Patients:  PinkCheek.be Fact Sheet for Healthcare Providers:  GravelBags.it This test is not yet ap proved or cleared by the [place] FDA and  has been authorized for detection and/or diagnosis of SARS-CoV-2 by FDA under an Emergency Use Authorization (EUA). This EUA will remain  in effect (meaning this test can be used) for the duration of the COVID-19 declaration under Section 564(b)(1) of the Act, 21 U.S.C. section 360bbb-3(b)(1), unless the authorization is terminated or revoked sooner.    Influenza A by PCR NEGATIVE NEGATIVE Final   Influenza B by PCR NEGATIVE NEGATIVE Final    Comment: (NOTE) The Xpert Xpress SARS-CoV-2/FLU/RSV assay is intended as an aid in  the diagnosis of influenza from Nasopharyngeal swab specimens and  should not be used as a sole basis for treatment. Nasal washings and  aspirates are unacceptable for Xpert Xpress SARS-CoV-2/FLU/RSV  testing. Fact Sheet for Patients: PinkCheek.be Fact Sheet for Healthcare  Providers: GravelBags.it This test is not yet approved or cleared by the [place] FDA and  has been authorized for detection and/or diagnosis of SARS-CoV-2 by  FDA under an Emergency Use Authorization (EUA). This EUA will remain  in effect (meaning this test can be used) for the duration of the  Covid-19 declaration under Section 564(b)(1) of the Act, 21  U.S.C. section 360bbb-3(b)(1), unless the authorization is  terminated or revoked. Performed at [facility], [address] [place], [place] [address]      Labs: BNP (last 3 results) No results for input(s): BNP in the last 8760 hours. Basic Metabolic Panel: Recent Labs  Lab 02/05/20 2153  NA 139  K 4.0  CL 109  CO2 21*  GLUCOSE 165*  BUN 20  CREATININE 0.98  CALCIUM 8.5*   Liver Function Tests: Recent Labs  Lab 02/05/20 2156  AST 57*  ALT 26  ALKPHOS 52  BILITOT 0.8  PROT 6.5  ALBUMIN 3.6   No results for input(s): LIPASE, AMYLASE in the last 168 hours. No results for input(s): AMMONIA in the last 168 hours. CBC: Recent Labs  Lab 02/05/20 2153  WBC 6.8  HGB 11.1*  HCT 36.3  MCV 92.8  PLT 128*   Cardiac Enzymes: No results for input(s): CKTOTAL, CKMB, CKMBINDEX, TROPONINI in the last 168 hours. BNP: Invalid input(s): POCBNP CBG: Recent Labs  Lab 02/06/20 1143 02/06/20 1623 02/06/20 2050 02/07/20 0746 02/07/20 1154  GLUCAP 144* 139* 141* 142* 171*   D-Dimer No results for input(s): DDIMER in the last 72 hours. Hgb A1c Recent Labs    02/06/20 0205  HGBA1C 6.3*   Lipid Profile No results for input(s): CHOL, HDL, LDLCALC, TRIG, CHOLHDL, LDLDIRECT in the last 72 hours. Thyroid function studies No results for input(s): TSH, T4TOTAL, T3FREE, THYROIDAB in the last 72 hours.  Invalid input(s): FREET3 Anemia work up No results for input(s): VITAMINB12, FOLATE, FERRITIN, TIBC, IRON, RETICCTPCT in the last 72 hours. Urinalysis    Component Value  Date/Time   COLORURINE AMBER (A) 02/06/2020 0005   APPEARANCEUR HAZY (A) 02/06/2020 0005   APPEARANCEUR Cloudy (A)  07/26/2016 1119   LABSPEC 1.023 02/06/2020 0005   LABSPEC 1.024 01/30/2014 0026   PHURINE 5.0 02/06/2020 0005   GLUCOSEU NEGATIVE 02/06/2020 0005   GLUCOSEU >=500 01/30/2014 0026   HGBUR NEGATIVE 02/06/2020 0005   BILIRUBINUR SMALL (A) 02/06/2020 0005   BILIRUBINUR Negative 07/26/2016 1119   BILIRUBINUR Negative 01/30/2014 0026   KETONESUR NEGATIVE 02/06/2020 0005   PROTEINUR 30 (A) 02/06/2020 0005   UROBILINOGEN 1.0 07/11/2008 1245   NITRITE NEGATIVE 02/06/2020 0005   LEUKOCYTESUR NEGATIVE 02/06/2020 0005   LEUKOCYTESUR Trace 01/30/2014 0026   Sepsis Labs Invalid input(s): PROCALCITONIN,  WBC,  LACTICIDVEN Microbiology Recent Results (from the past 240 hour(s))  Blood culture (routine x 2)     Status: None (Preliminary result)   Collection Time: 02/05/20 10:18 PM   Specimen: BLOOD  Result Value Ref Range Status   Specimen Description BLOOD BLOOD RIGHT FOREARM  Final   Special Requests   Final    BOTTLES DRAWN AEROBIC AND ANAEROBIC Blood Culture results may not be optimal due to an inadequate volume of blood received in culture bottles   Culture   Final    NO GROWTH 2 DAYS Performed at [facility], [address], [place], [place] [address]    Report Status PENDING  Incomplete  Blood culture (routine x 2)     Status: None (Preliminary result)   Collection Time: 02/05/20 10:19 PM   Specimen: BLOOD  Result Value Ref Range Status   Specimen Description BLOOD RIGHT ANTECUBITAL  Final   Special Requests   Final    BOTTLES DRAWN AEROBIC AND ANAEROBIC Blood Culture adequate volume   Culture   Final    NO GROWTH 2 DAYS Performed at [facility], [address] [place], [place] [address]    Report Status PENDING  Incomplete  Urine culture     Status: None   Collection Time: 02/06/20 12:05 AM   Specimen: Urine, Random  Result Value Ref Range  Status   Specimen Description   Final    URINE, RANDOM Performed at [facility], [address], [place], [place] [address]    Special Requests   Final    NONE Performed at [facility], [address] [place], [place] [address]    Culture   Final    NO GROWTH Performed at [facility], [place] [address] [place],  [address]    Report Status 02/07/2020 FINAL  Final  Respiratory Panel by RT PCR (Flu A&B, Covid) - Nasopharyngeal Swab     Status: None   Collection Time: 02/06/20  2:08 AM   Specimen: Nasopharyngeal Swab  Result Value Ref Range Status   SARS Coronavirus 2 by RT PCR NEGATIVE NEGATIVE Final    Comment: (NOTE) SARS-CoV-2 target nucleic acids are NOT DETECTED. The SARS-CoV-2 RNA is generally detectable in upper respiratoy specimens during the acute phase of infection. The lowest concentration of SARS-CoV-2 viral copies this assay can detect is 131 copies/mL. A negative result does not preclude SARS-Cov-2 infection and should not be used as the sole basis for treatment or other patient management decisions. A negative result may occur with  improper specimen collection/handling, submission of specimen other than nasopharyngeal swab, presence of viral mutation(s) within the areas targeted by this assay, and inadequate number of viral copies (<131 copies/mL). A negative result must be combined with clinical observations, patient history, and epidemiological information. The expected result is Negative. Fact Sheet for Patients:  PinkCheek.be Fact Sheet for  Healthcare Providers:  GravelBags.it This test is not yet ap proved or cleared by the [place] and  has been authorized for detection and/or diagnosis of SARS-CoV-2 by FDA under an Emergency Use Authorization (EUA). This EUA will remain  in effect (meaning this test can be used) for the duration of the COVID-19 declaration  under Section 564(b)(1) of the Act, 21 U.S.C. section 360bbb-3(b)(1), unless the authorization is terminated or revoked sooner.    Influenza A by PCR NEGATIVE NEGATIVE Final   Influenza B by PCR NEGATIVE NEGATIVE Final    Comment: (NOTE) The Xpert Xpress SARS-CoV-2/FLU/RSV assay is intended as an aid in  the diagnosis of influenza from Nasopharyngeal swab specimens and  should not be used as a sole basis for treatment. Nasal washings and  aspirates are unacceptable for Xpert Xpress SARS-CoV-2/FLU/RSV  testing. Fact Sheet for Patients: PinkCheek.be Fact Sheet for Healthcare Providers: GravelBags.it This test is not yet approved or cleared by the [place] FDA and  has been authorized for detection and/or diagnosis of SARS-CoV-2 by  FDA under an Emergency Use Authorization (EUA). This EUA will remain  in effect (meaning this test can be used) for the duration of the  Covid-19 declaration under Section 564(b)(1) of the Act, 21  U.S.C. section 360bbb-3(b)(1), unless the authorization is  terminated or revoked. Performed at [facility], [address] [place], [place] [address]     Time coordinating discharge: Over 30 minutes  SIGNED:  Lorella Nimrod, MD  [facility] 02/07/2020, 2:37 PM  If 7PM-7AM, please contact night-coverage [email or web address]  This record has been created using Systems analyst. Errors have been sought and corrected,but may not always be located. Such creation errors do not reflect on the standard of care.

## 2020-02-07 NOTE — Progress Notes (Signed)
Patient to make her own follow-up appointment

## 2020-02-07 NOTE — Plan of Care (Signed)

## 2020-02-10 LAB — CULTURE, BLOOD (ROUTINE X 2)
Culture: NO GROWTH
Culture: NO GROWTH
Special Requests: ADEQUATE

## 2020-03-06 ENCOUNTER — Inpatient Hospital Stay
Admission: EM | Admit: 2020-03-06 | Discharge: 2020-03-08 | DRG: 682 | Disposition: A | Discharge status: Home / Self Care | Payer: Medicare HMO | Attending: Internal Medicine | Admitting: Internal Medicine

## 2020-03-06 ENCOUNTER — Other Ambulatory Visit: Payer: Self-pay

## 2020-03-06 ENCOUNTER — Emergency Department: Payer: Medicare HMO

## 2020-03-06 ENCOUNTER — Encounter: Payer: Self-pay | Admitting: Emergency Medicine

## 2020-03-06 DIAGNOSIS — E785 Hyperlipidemia, unspecified: Secondary | ICD-10-CM | POA: Diagnosis present

## 2020-03-06 DIAGNOSIS — M1990 Unspecified osteoarthritis, unspecified site: Secondary | ICD-10-CM | POA: Diagnosis present

## 2020-03-06 DIAGNOSIS — I5032 Chronic diastolic (congestive) heart failure: Secondary | ICD-10-CM | POA: Diagnosis present

## 2020-03-06 DIAGNOSIS — N390 Urinary tract infection, site not specified: Secondary | ICD-10-CM | POA: Diagnosis present

## 2020-03-06 DIAGNOSIS — E039 Hypothyroidism, unspecified: Secondary | ICD-10-CM | POA: Diagnosis present

## 2020-03-06 DIAGNOSIS — G894 Chronic pain syndrome: Secondary | ICD-10-CM | POA: Diagnosis present

## 2020-03-06 DIAGNOSIS — E860 Dehydration: Secondary | ICD-10-CM | POA: Diagnosis present

## 2020-03-06 DIAGNOSIS — E875 Hyperkalemia: Secondary | ICD-10-CM | POA: Diagnosis present

## 2020-03-06 DIAGNOSIS — K219 Gastro-esophageal reflux disease without esophagitis: Secondary | ICD-10-CM | POA: Diagnosis present

## 2020-03-06 DIAGNOSIS — Y92009 Unspecified place in unspecified non-institutional (private) residence as the place of occurrence of the external cause: Secondary | ICD-10-CM

## 2020-03-06 DIAGNOSIS — S0003XA Contusion of scalp, initial encounter: Secondary | ICD-10-CM | POA: Diagnosis present

## 2020-03-06 DIAGNOSIS — G9341 Metabolic encephalopathy: Secondary | ICD-10-CM | POA: Diagnosis not present

## 2020-03-06 DIAGNOSIS — N179 Acute kidney failure, unspecified: Secondary | ICD-10-CM | POA: Diagnosis present

## 2020-03-06 DIAGNOSIS — J9601 Acute respiratory failure with hypoxia: Secondary | ICD-10-CM | POA: Diagnosis present

## 2020-03-06 DIAGNOSIS — I959 Hypotension, unspecified: Secondary | ICD-10-CM | POA: Diagnosis present

## 2020-03-06 DIAGNOSIS — Z6841 Body Mass Index (BMI) 40.0 and over, adult: Secondary | ICD-10-CM | POA: Diagnosis not present

## 2020-03-06 DIAGNOSIS — W1830XA Fall on same level, unspecified, initial encounter: Secondary | ICD-10-CM | POA: Diagnosis present

## 2020-03-06 DIAGNOSIS — Z20822 Contact with and (suspected) exposure to covid-19: Secondary | ICD-10-CM | POA: Diagnosis present

## 2020-03-06 DIAGNOSIS — G4733 Obstructive sleep apnea (adult) (pediatric): Secondary | ICD-10-CM | POA: Diagnosis present

## 2020-03-06 DIAGNOSIS — Z79899 Other long term (current) drug therapy: Secondary | ICD-10-CM

## 2020-03-06 DIAGNOSIS — F419 Anxiety disorder, unspecified: Secondary | ICD-10-CM | POA: Diagnosis present

## 2020-03-06 DIAGNOSIS — M810 Age-related osteoporosis without current pathological fracture: Secondary | ICD-10-CM | POA: Diagnosis present

## 2020-03-06 DIAGNOSIS — I2510 Atherosclerotic heart disease of native coronary artery without angina pectoris: Secondary | ICD-10-CM | POA: Diagnosis present

## 2020-03-06 DIAGNOSIS — Z66 Do not resuscitate: Secondary | ICD-10-CM | POA: Diagnosis present

## 2020-03-06 DIAGNOSIS — I252 Old myocardial infarction: Secondary | ICD-10-CM

## 2020-03-06 DIAGNOSIS — Z7982 Long term (current) use of aspirin: Secondary | ICD-10-CM

## 2020-03-06 DIAGNOSIS — F329 Major depressive disorder, single episode, unspecified: Secondary | ICD-10-CM | POA: Diagnosis present

## 2020-03-06 DIAGNOSIS — I110 Hypertensive heart disease with heart failure: Secondary | ICD-10-CM | POA: Diagnosis present

## 2020-03-06 DIAGNOSIS — G92 Toxic encephalopathy: Secondary | ICD-10-CM | POA: Diagnosis present

## 2020-03-06 DIAGNOSIS — Z87440 Personal history of urinary (tract) infections: Secondary | ICD-10-CM

## 2020-03-06 DIAGNOSIS — Z794 Long term (current) use of insulin: Secondary | ICD-10-CM

## 2020-03-06 DIAGNOSIS — R4182 Altered mental status, unspecified: Secondary | ICD-10-CM | POA: Diagnosis present

## 2020-03-06 DIAGNOSIS — Z79890 Hormone replacement therapy (postmenopausal): Secondary | ICD-10-CM

## 2020-03-06 DIAGNOSIS — E119 Type 2 diabetes mellitus without complications: Secondary | ICD-10-CM | POA: Diagnosis present

## 2020-03-06 DIAGNOSIS — N3000 Acute cystitis without hematuria: Secondary | ICD-10-CM

## 2020-03-06 DIAGNOSIS — F32A Depression, unspecified: Secondary | ICD-10-CM | POA: Diagnosis present

## 2020-03-06 DIAGNOSIS — Z79891 Long term (current) use of opiate analgesic: Secondary | ICD-10-CM

## 2020-03-06 DIAGNOSIS — T50915A Adverse effect of multiple unspecified drugs, medicaments and biological substances, initial encounter: Secondary | ICD-10-CM | POA: Diagnosis present

## 2020-03-06 DIAGNOSIS — R296 Repeated falls: Secondary | ICD-10-CM | POA: Diagnosis present

## 2020-03-06 DIAGNOSIS — Z955 Presence of coronary angioplasty implant and graft: Secondary | ICD-10-CM

## 2020-03-06 DIAGNOSIS — N189 Chronic kidney disease, unspecified: Secondary | ICD-10-CM | POA: Diagnosis present

## 2020-03-06 DIAGNOSIS — Z7902 Long term (current) use of antithrombotics/antiplatelets: Secondary | ICD-10-CM

## 2020-03-06 LAB — RESPIRATORY PANEL BY RT PCR (FLU A&B, COVID)
Influenza A by PCR: NEGATIVE
Influenza B by PCR: NEGATIVE
SARS Coronavirus 2 by RT PCR: NEGATIVE

## 2020-03-06 LAB — CBC WITH DIFFERENTIAL/PLATELET
Abs Immature Granulocytes: 0.09 10*3/uL — ABNORMAL HIGH (ref 0.00–0.07)
Basophils Absolute: 0.1 10*3/uL (ref 0.0–0.1)
Basophils Relative: 1 %
Eosinophils Absolute: 0.2 10*3/uL (ref 0.0–0.5)
Eosinophils Relative: 2 %
HCT: 35.4 % — ABNORMAL LOW (ref 36.0–46.0)
Hemoglobin: 11 g/dL — ABNORMAL LOW (ref 12.0–15.0)
Immature Granulocytes: 1 %
Lymphocytes Relative: 20 %
Lymphs Abs: 1.6 10*3/uL (ref 0.7–4.0)
MCH: 28.6 pg (ref 26.0–34.0)
MCHC: 31.1 g/dL (ref 30.0–36.0)
MCV: 92.2 fL (ref 80.0–100.0)
Monocytes Absolute: 0.9 10*3/uL (ref 0.1–1.0)
Monocytes Relative: 11 %
Neutro Abs: 5.2 10*3/uL (ref 1.7–7.7)
Neutrophils Relative %: 65 %
Platelets: 159 10*3/uL (ref 150–400)
RBC: 3.84 MIL/uL — ABNORMAL LOW (ref 3.87–5.11)
RDW: 15.6 % — ABNORMAL HIGH (ref 11.5–15.5)
WBC: 8 10*3/uL (ref 4.0–10.5)
nRBC: 0 % (ref 0.0–0.2)

## 2020-03-06 LAB — URINALYSIS, COMPLETE (UACMP) WITH MICROSCOPIC
Bilirubin Urine: NEGATIVE
Glucose, UA: NEGATIVE mg/dL
Ketones, ur: NEGATIVE mg/dL
Nitrite: NEGATIVE
Protein, ur: >300 mg/dL — AB
Specific Gravity, Urine: 1.025 (ref 1.005–1.030)
WBC, UA: >50 WBC/hpf (ref 0–5)
pH: 5.5 (ref 5.0–8.0)

## 2020-03-06 LAB — COMPREHENSIVE METABOLIC PANEL
ALT: 18 U/L (ref 0–44)
AST: 23 U/L (ref 15–41)
Albumin: 4.1 g/dL (ref 3.5–5.0)
Alkaline Phosphatase: 61 U/L (ref 38–126)
Anion gap: 9 (ref 5–15)
BUN: 48 mg/dL — ABNORMAL HIGH (ref 8–23)
CO2: 24 mmol/L (ref 22–32)
Calcium: 8.9 mg/dL (ref 8.9–10.3)
Chloride: 106 mmol/L (ref 98–111)
Creatinine, Ser: 3.42 mg/dL — ABNORMAL HIGH (ref 0.44–1.00)
GFR calc Af Amer: 15 mL/min — ABNORMAL LOW (ref >60)
GFR calc non Af Amer: 13 mL/min — ABNORMAL LOW (ref >60)
Glucose, Bld: 198 mg/dL — ABNORMAL HIGH (ref 70–99)
Potassium: 5.3 mmol/L — ABNORMAL HIGH (ref 3.5–5.1)
Sodium: 139 mmol/L (ref 135–145)
Total Bilirubin: 0.6 mg/dL (ref 0.3–1.2)
Total Protein: 7.1 g/dL (ref 6.5–8.1)

## 2020-03-06 LAB — LACTIC ACID, PLASMA: Lactic Acid, Venous: 1.7 mmol/L (ref 0.5–1.9)

## 2020-03-06 LAB — GLUCOSE, CAPILLARY
Glucose-Capillary: 160 mg/dL — ABNORMAL HIGH (ref 70–99)
Glucose-Capillary: 144 mg/dL — ABNORMAL HIGH (ref 70–99)

## 2020-03-06 LAB — TROPONIN I (HIGH SENSITIVITY)
Troponin I (High Sensitivity): 7 ng/L (ref <18)
Troponin I (High Sensitivity): 6 ng/L (ref <18)

## 2020-03-06 LAB — BRAIN NATRIURETIC PEPTIDE: B Natriuretic Peptide: 36 pg/mL (ref 0.0–100.0)

## 2020-03-06 MED ORDER — IPRATROPIUM-ALBUTEROL 0.5-2.5 (3) MG/3ML IN SOLN: 3.0000 mL | Freq: Once | RESPIRATORY_TRACT | Status: DC

## 2020-03-06 MED ORDER — BUPRENORPHINE HCL 300 MCG BU FILM: 1.0000 | ORAL_FILM | Freq: Two times a day (BID) | BUCCAL | Status: DC

## 2020-03-06 MED ORDER — ONDANSETRON HCL 4 MG PO TABS: 4.0000 mg | ORAL_TABLET | Freq: Four times a day (QID) | ORAL | Status: DC | PRN

## 2020-03-06 MED ORDER — ASPIRIN EC 81 MG PO TBEC
81.0000 mg | DELAYED_RELEASE_TABLET | Freq: Every day | ORAL | Status: DC
  Administered 2020-03-06 – 2020-03-08 (×3): 81 mg via ORAL
  Filled 2020-03-06 (×3): qty 1

## 2020-03-06 MED ORDER — PATIROMER SORBITEX CALCIUM 8.4 G PO PACK
16.8000 g | PACK | Freq: Every day | ORAL | Status: DC
  Administered 2020-03-06 – 2020-03-07 (×2): 16.8 g via ORAL
  Filled 2020-03-06 (×2): qty 2

## 2020-03-06 MED ORDER — SODIUM CHLORIDE 0.9 % IV BOLUS
500.0000 mL | Freq: Once | INTRAVENOUS | Status: AC
  Administered 2020-03-06: 500 mL via INTRAVENOUS

## 2020-03-06 MED ORDER — ONDANSETRON HCL 4 MG/2ML IJ SOLN: 4.0000 mg | Freq: Four times a day (QID) | INTRAMUSCULAR | Status: DC | PRN

## 2020-03-06 MED ORDER — SODIUM CHLORIDE 0.9 % IV SOLN
INTRAVENOUS | Status: DC | PRN
  Administered 2020-03-06: 22:00:00 250 mL via INTRAVENOUS

## 2020-03-06 MED ORDER — ATORVASTATIN CALCIUM 20 MG PO TABS
40.0000 mg | ORAL_TABLET | Freq: Every day | ORAL | Status: DC
  Administered 2020-03-06 – 2020-03-07 (×2): 40 mg via ORAL
  Filled 2020-03-06 (×2): qty 2

## 2020-03-06 MED ORDER — NALOXONE HCL 0.4 MG/ML IJ SOLN
0.2000 mg | Freq: Once | INTRAMUSCULAR | Status: AC
  Administered 2020-03-06: 08:00:00 0.2 mg via INTRAVENOUS
  Filled 2020-03-06 (×2): qty 1

## 2020-03-06 MED ORDER — LEVOTHYROXINE SODIUM 50 MCG PO TABS
75.0000 ug | ORAL_TABLET | Freq: Every day | ORAL | Status: DC
  Administered 2020-03-07 – 2020-03-08 (×2): 75 ug via ORAL
  Filled 2020-03-06 (×2): qty 1

## 2020-03-06 MED ORDER — ALBUTEROL SULFATE (2.5 MG/3ML) 0.083% IN NEBU: 3.0000 mL | INHALATION_SOLUTION | RESPIRATORY_TRACT | Status: DC | PRN

## 2020-03-06 MED ORDER — LIDOCAINE-EPINEPHRINE-TETRACAINE (LET) TOPICAL GEL
3.0000 mL | Freq: Once | TOPICAL | Status: AC
  Administered 2020-03-06: 3 mL via TOPICAL

## 2020-03-06 MED ORDER — VITAMIN D 25 MCG (1000 UNIT) PO TABS
1000.0000 [IU] | ORAL_TABLET | Freq: Every day | ORAL | Status: DC
  Administered 2020-03-07: 1000 [IU] via ORAL
  Filled 2020-03-06: qty 1

## 2020-03-06 MED ORDER — INSULIN ASPART 100 UNIT/ML ~~LOC~~ SOLN
0.0000 [IU] | Freq: Three times a day (TID) | SUBCUTANEOUS | Status: DC
  Administered 2020-03-06: 18:00:00 4 [IU] via SUBCUTANEOUS
  Administered 2020-03-07: 3 [IU] via SUBCUTANEOUS
  Administered 2020-03-07: 4 [IU] via SUBCUTANEOUS
  Administered 2020-03-07 – 2020-03-08 (×3): 7 [IU] via SUBCUTANEOUS
  Filled 2020-03-06 (×5): qty 1

## 2020-03-06 MED ORDER — SODIUM CHLORIDE 0.9 % IV SOLN
1.0000 g | Freq: Once | INTRAVENOUS | Status: AC
  Administered 2020-03-06: 10:00:00 1 g via INTRAVENOUS
  Filled 2020-03-06: qty 10

## 2020-03-06 MED ORDER — HEPARIN SODIUM (PORCINE) 5000 UNIT/ML IJ SOLN
5000.0000 [IU] | Freq: Three times a day (TID) | INTRAMUSCULAR | Status: DC
  Administered 2020-03-06 – 2020-03-08 (×7): 5000 [IU] via SUBCUTANEOUS
  Filled 2020-03-06 (×7): qty 1

## 2020-03-06 MED ORDER — FLUTICASONE PROPIONATE 50 MCG/ACT NA SUSP
2.0000 | Freq: Every day | NASAL | Status: DC | PRN
  Filled 2020-03-06: qty 16

## 2020-03-06 MED ORDER — SODIUM CHLORIDE 0.9 % IV SOLN: Freq: Once | INTRAVENOUS | Status: AC

## 2020-03-06 MED ORDER — GABAPENTIN 300 MG PO CAPS
300.0000 mg | ORAL_CAPSULE | Freq: Three times a day (TID) | ORAL | Status: DC
  Administered 2020-03-06 – 2020-03-08 (×6): 300 mg via ORAL
  Filled 2020-03-06 (×6): qty 1

## 2020-03-06 MED ORDER — ACETAMINOPHEN 650 MG RE SUPP: 650.0000 mg | Freq: Four times a day (QID) | RECTAL | Status: DC | PRN

## 2020-03-06 MED ORDER — SODIUM CHLORIDE 0.9 % IV SOLN
500.0000 mg | Freq: Two times a day (BID) | INTRAVENOUS | Status: DC
  Administered 2020-03-06 (×2): 500 mg via INTRAVENOUS
  Filled 2020-03-06: qty 0.5
  Filled 2020-03-06: qty 500
  Filled 2020-03-06 (×2): qty 0.5

## 2020-03-06 MED ORDER — TOPIRAMATE 100 MG PO TABS
50.0000 mg | ORAL_TABLET | Freq: Every evening | ORAL | Status: DC
  Administered 2020-03-07: 50 mg via ORAL
  Filled 2020-03-06: qty 2
  Filled 2020-03-06 (×2): qty 0.5

## 2020-03-06 MED ORDER — NYSTATIN 100000 UNIT/GM EX POWD
1.0000 "application " | Freq: Two times a day (BID) | CUTANEOUS | Status: DC | PRN
  Filled 2020-03-06: qty 15

## 2020-03-06 MED ORDER — INSULIN GLARGINE 100 UNIT/ML ~~LOC~~ SOLN
75.0000 [IU] | Freq: Every day | SUBCUTANEOUS | Status: DC
  Administered 2020-03-06 – 2020-03-07 (×2): 75 [IU] via SUBCUTANEOUS
  Filled 2020-03-06 (×4): qty 0.75

## 2020-03-06 MED ORDER — INSULIN ASPART 100 UNIT/ML ~~LOC~~ SOLN
6.0000 [IU] | Freq: Three times a day (TID) | SUBCUTANEOUS | Status: DC
  Administered 2020-03-07 – 2020-03-08 (×5): 6 [IU] via SUBCUTANEOUS
  Filled 2020-03-06 (×4): qty 1

## 2020-03-06 MED ORDER — CYCLOBENZAPRINE HCL 10 MG PO TABS
5.0000 mg | ORAL_TABLET | Freq: Every evening | ORAL | Status: DC
  Administered 2020-03-06: 22:00:00 5 mg via ORAL
  Filled 2020-03-06: qty 1

## 2020-03-06 MED ORDER — ACETAMINOPHEN 325 MG PO TABS
650.0000 mg | ORAL_TABLET | Freq: Four times a day (QID) | ORAL | Status: DC | PRN
  Administered 2020-03-07 (×2): 650 mg via ORAL
  Filled 2020-03-06 (×2): qty 2

## 2020-03-06 NOTE — ED Triage Notes (Signed)
BIB [facility] after having been found by caregiver on floor near recliner, unknown if LOC or head trauma.  Altered mental status with lethargy, unknown baseline, no documented hx of dementia.  CBG 205 w/ ems, initially in the 80's on spo2, brought up to >90% with 2 L [place]. From home.

## 2020-03-06 NOTE — Progress Notes (Signed)
Pt comfortable in bed.   I called daughter [name] and got the phone number of the housemate and called him and left a voice message asking him to call the floor with her medication list and if he could please bring her buprenorphine films from home as our pharmacy doesn't carry them. Hopefully he'll call back.

## 2020-03-06 NOTE — ED Provider Notes (Signed)
[facility] Emergency Department Provider Note    First MD Initiated Contact with Patient 03/06/20 [phone]     (approximate)  I have reviewed the triage vital signs and the nursing notes.   HISTORY  Chief Complaint Altered Mental Status  Level V Caveat:  AMS  HPI [name] is a 74 y.o. female with the below listed past medical history with recent presentation and hospitalization for altered mental status and frequent falls presents the ER for frequent falls altered mental status seen normal last night.  Was found at the base of her recliner.  Did have small contusion to the back of her head.  Is complaining of mild headache.  Sent to the ER for further evaluation.  Found to be hypotensive as well as hypoxic on room air requiring supplemental oxygen.  Patient unable to provide much additional history.    Past Medical History:  Diagnosis Date  . Amnesia   . Anxiety   . Arthritis   . Back pain   . CHF (congestive heart failure) ([place])   . Chronic pain syndrome   . Cystocele   . Depression   . Diabetes mellitus without complication ([place])   . GERD (gastroesophageal reflux disease)   . Hyperlipidemia   . Hypertension   . IBS (irritable bowel syndrome)   . Myocardial infarction ([place])   . Obesity   . Osteoporosis   . Osteoporosis   . Shortness of breath dyspnea   . Sleep apnea    not using now  . Thyroid nodule    Family History  Problem Relation Age of Onset  . Cancer Mother   . Cancer Father   . Cancer Sister   . Thyroid disease Neg Hx    Past Surgical History:  Procedure Laterality Date  . ABDOMINAL HYSTERECTOMY    . APPENDECTOMY    . CARDIAC CATHETERIZATION    . CHOLECYSTECTOMY    . CORONARY STENT INTERVENTION N/A 05/05/2018   Procedure: CORONARY STENT INTERVENTION;  Surgeon: Isaias Cowman, MD;  Location: [facility];  Service: Cardiovascular;  Laterality: N/A;  . FOOT SURGERY    . LEFT HEART CATH AND CORONARY  ANGIOGRAPHY Left 05/05/2018   Procedure: LEFT HEART CATH AND CORONARY ANGIOGRAPHY;  Surgeon: Isaias Cowman, MD;  Location: [facility];  Service: Cardiovascular;  Laterality: Left;  . OOPHORECTOMY    . REPLACEMENT TOTAL KNEE    . TONSILLECTOMY     Patient Active Problem List   Diagnosis Date Noted  . Acute metabolic encephalopathy [id number]  . Acute respiratory failure with hypoxia ([place]) 02/06/2020  . OSA (obstructive sleep apnea) 02/06/2020  . CPAP ventilation treatment not tolerated 02/06/2020  . Morbid obesity with BMI of 45.0-49.9, adult ([place]) 02/06/2020  . Lactic acidosis 02/06/2020  . possible Sepsis ([place]) 02/06/2020  . Polypharmacy 02/06/2020  . CAD (coronary artery disease) 05/05/2018  . Microscopic hematuria 09/17/2016  . Cystitis, chronic 09/17/2016  . Adrenal cortical nodule ([place]) LEFT, small 09/17/2016  . Closed fracture of metatarsal bone 07/22/2016  . Plantar fasciitis, left 05/28/2016  . Degenerative spondylolisthesis 01/02/2016  . Tachycardia 01/02/2016  . Diabetes mellitus with complication ([place])   . Essential hypertension   . Non-toxic multinodular goiter 10/11/2014  . Diabetes mellitus ([place]) 10/11/2014  . Hyperlipidemia 10/11/2014  . Anxiety and depression 10/11/2014  . Osteoporosis 10/11/2014  . GERD (gastroesophageal reflux disease) 10/11/2014  . Arthritis 10/11/2014  . Cystocele 10/11/2014  . Chronic pain syndrome 10/11/2014  .  Amnesia 10/11/2014  . Back pain 10/11/2014      Prior to Admission medications   Medication Sig Start Date End Date Taking? Authorizing Provider  albuterol (VENTOLIN HFA) 108 (90 Base) MCG/ACT inhaler Inhale 2 puffs into the lungs every 4 (four) hours as needed for shortness of breath or wheezing. 12/12/19   [provider]  aspirin EC 81 MG tablet Take 81 mg by mouth daily.    [provider]  atorvastatin (LIPITOR) 40 MG tablet Take 40 mg by mouth at bedtime. 12/06/19   [provider]    busPIRone (BUSPAR) 30 MG tablet Take 30 mg by mouth 2 (two) times daily.    [provider]  Cholecalciferol (VITAMIN D3) 1000 units CAPS Take 1,000 Units by mouth daily.     [provider]  clonazePAM (KLONOPIN) 0.5 MG tablet Take 1 tablet (0.5 mg total) by mouth daily. 02/07/20   Lorella Nimrod, MD  clopidogrel (PLAVIX) 75 MG tablet Take 75 mg by mouth daily.    [provider]  cyclobenzaprine (FLEXERIL) 5 MG tablet Take 1 tablet (5 mg total) by mouth daily. 02/08/20   Lorella Nimrod, MD  doxepin (SINEQUAN) 10 MG capsule Take 1 capsule (10 mg total) by mouth at bedtime. 02/07/20   Lorella Nimrod, MD  DULoxetine (CYMBALTA) 60 MG capsule Take 60 mg by mouth daily.    [provider]  fluticasone (FLONASE) 50 MCG/ACT nasal spray Place 2 sprays into both nostrils daily. 01/27/20   [provider]  furosemide (LASIX) 20 MG tablet Take 20 mg by mouth 2 (two) times daily. 01/28/20   [provider]  gabapentin (NEURONTIN) 300 MG capsule Take 1 capsule (300 mg total) by mouth 3 (three) times daily. 02/07/20   Lorella Nimrod, MD  LANTUS SOLOSTAR 100 UNIT/ML Solostar Pen Inject 75 Units into the skin at bedtime.     [provider]  levothyroxine (SYNTHROID) 75 MCG tablet Take 1 tablet (75 mcg total) by mouth daily before breakfast. 02/08/20   Lorella Nimrod, MD  linaclotide (LINZESS) 72 MCG capsule Take 72 mcg by mouth daily before breakfast.    [provider]  lisinopril (ZESTRIL) 40 MG tablet Take 1 tablet (40 mg total) by mouth daily. 02/08/20   Lorella Nimrod, MD  memantine (NAMENDA XR) 28 MG CP24 24 hr capsule Take 28 mg by mouth daily.    [provider]  metFORMIN (GLUCOPHAGE-XR) 500 MG 24 hr tablet Take 1,000 mg by mouth 2 (two) times daily. 01/18/20   [provider]  metoprolol succinate (TOPROL-XL) 50 MG 24 hr tablet Take 50 mg by mouth 2 (two) times daily.     [provider]  montelukast (SINGULAIR) 10 MG tablet  Take 10 mg by mouth daily. 01/28/20   [provider]  nystatin (MYCOSTATIN) powder Apply 1 application topically 2 (two) times daily as needed (rashy areas in skin folds).     [provider]  ondansetron (ZOFRAN) 4 MG tablet Take 4 mg by mouth every 8 (eight) hours as needed for nausea/vomiting. 01/28/20   [provider]  Oxycodone HCl 10 MG TABS Take 10 mg by mouth 4 (four) times daily as needed for severe pain. 02/02/20   [provider]  pantoprazole (PROTONIX) 40 MG tablet Take 40 mg by mouth daily.    [provider]  pioglitazone (ACTOS) 45 MG tablet Take 45 mg by mouth daily.    [provider]  QUEtiapine (SEROQUEL) 200 MG tablet Take  100-200 mg by mouth at bedtime. 01/20/20   [provider]  topiramate (TOPAMAX) 50 MG tablet Take 1 tablet (50 mg total) by mouth daily. 02/08/20   Lorella Nimrod, MD  VIIBRYD 40 MG TABS Take 40 mg by mouth daily. 01/27/20   [provider]  zolpidem (AMBIEN) 10 MG tablet Take 10 mg by mouth at bedtime as needed for sleep. 01/31/20   [provider]    Allergies Patient has no known allergies.    Social History Social History   Tobacco Use  . Smoking status: Never Smoker  . Smokeless tobacco: Never Used  Substance Use Topics  . Alcohol use: No  . Drug use: Never    Review of Systems Patient denies headaches, rhinorrhea, blurry vision, numbness, shortness of breath, chest pain, edema, cough, abdominal pain, nausea, vomiting, diarrhea, dysuria, fevers, rashes or hallucinations unless otherwise stated above in HPI. ____________________________________________   PHYSICAL EXAM:  VITAL SIGNS: Vitals:   03/06/20 0830 03/06/20 0942  BP: (!) 121/108 131/88  Pulse:  88  Resp: 20 15  Temp:    SpO2:  93%    Constitutional: drowsy but easily awoken and oriented x 3 Eyes: Conjunctivae are normal.  Head:  small superficial abrasion with small ecchymosis Nose: No  congestion/rhinnorhea. Mouth/Throat: Mucous membranes are moist.   Neck: No stridor. Painless ROM.  Cardiovascular: Normal rate, regular rhythm. Grossly normal heart sounds.  Good peripheral circulation. Respiratory: Normal respiratory effort.  No retractions. Lungs CTAB. Gastrointestinal: Soft and nontender. No distention. No abdominal bruits. No CVA tenderness. Genitourinary:  Musculoskeletal: No lower extremity tenderness nor edema.  No joint effusions. Neurologic:  Normal speech and language. No gross focal neurologic deficits are appreciated. No facial droop Skin:  Skin is warm, dry and intact. No rash noted. Psychiatric:  Speech is slow and behavior is subdued.  ____________________________________________   LABS (all labs ordered are listed, but only abnormal results are displayed)  Results for orders placed or performed during the hospital encounter of 03/06/20 (from the past 24 hour(s))  CBC with Differential/Platelet     Status: Abnormal   Collection Time: 03/06/20  7:25 AM  Result Value Ref Range   WBC 8.0 4.0 - 10.5 K/uL   RBC 3.84 (L) 3.87 - 5.11 MIL/uL   Hemoglobin 11.0 (L) 12.0 - 15.0 g/dL   HCT 35.4 (L) 36.0 - 46.0 %   MCV 92.2 80.0 - 100.0 fL   MCH 28.6 26.0 - 34.0 pg   MCHC 31.1 30.0 - 36.0 g/dL   RDW 15.6 (H) 11.5 - 15.5 %   Platelets 159 150 - 400 K/uL   nRBC 0.0 0.0 - 0.2 %   Neutrophils Relative % 65 %   Neutro Abs 5.2 1.7 - 7.7 K/uL   Lymphocytes Relative 20 %   Lymphs Abs 1.6 0.7 - 4.0 K/uL   Monocytes Relative 11 %   Monocytes Absolute 0.9 0.1 - 1.0 K/uL   Eosinophils Relative 2 %   Eosinophils Absolute 0.2 0.0 - 0.5 K/uL   Basophils Relative 1 %   Basophils Absolute 0.1 0.0 - 0.1 K/uL   Immature Granulocytes 1 %   Abs Immature Granulocytes 0.09 (H) 0.00 - 0.07 K/uL  Comprehensive metabolic panel     Status: Abnormal   Collection Time: 03/06/20  7:25 AM  Result Value Ref Range   Sodium 139 135 - 145 mmol/L   Potassium 5.3 (H) 3.5 - 5.1 mmol/L    Chloride 106 98 - 111 mmol/L  CO2 24 22 - 32 mmol/L   Glucose, Bld 198 (H) 70 - 99 mg/dL   BUN 48 (H) 8 - 23 mg/dL   Creatinine, Ser 3.42 (H) 0.44 - 1.00 mg/dL   Calcium 8.9 8.9 - 10.3 mg/dL   Total Protein 7.1 6.5 - 8.1 g/dL   Albumin 4.1 3.5 - 5.0 g/dL   AST 23 15 - 41 U/L   ALT 18 0 - 44 U/L   Alkaline Phosphatase 61 38 - 126 U/L   Total Bilirubin 0.6 0.3 - 1.2 mg/dL   GFR calc non Af Amer 13 (L) >60 mL/min   GFR calc Af Amer 15 (L) >60 mL/min   Anion gap 9 5 - 15  Urinalysis, Complete w Microscopic     Status: Abnormal   Collection Time: 03/06/20  7:25 AM  Result Value Ref Range   Color, Urine YELLOW YELLOW   APPearance CLOUDY (A) CLEAR   Specific Gravity, Urine 1.025 1.005 - 1.030   pH 5.5 5.0 - 8.0   Glucose, UA NEGATIVE NEGATIVE mg/dL   Hgb urine dipstick MODERATE (A) NEGATIVE   Bilirubin Urine NEGATIVE NEGATIVE   Ketones, ur NEGATIVE NEGATIVE mg/dL   Protein, ur >300 (A) NEGATIVE mg/dL   Nitrite NEGATIVE NEGATIVE   Leukocytes,Ua LARGE (A) NEGATIVE   Squamous Epithelial / LPF 0-5 0 - 5   WBC, UA >50 0 - 5 WBC/hpf   RBC / HPF 0-5 0 - 5 RBC/hpf   Bacteria, UA MANY (A) NONE SEEN  Troponin I (High Sensitivity)     Status: None   Collection Time: 03/06/20  7:25 AM  Result Value Ref Range   Troponin I (High Sensitivity) 7 <18 ng/L  Lactic acid, plasma     Status: None   Collection Time: 03/06/20  7:25 AM  Result Value Ref Range   Lactic Acid, Venous 1.7 0.5 - 1.9 mmol/L  Respiratory Panel by RT PCR (Flu A&B, Covid) - Nasopharyngeal Swab     Status: None   Collection Time: 03/06/20  7:25 AM   Specimen: Nasopharyngeal Swab  Result Value Ref Range   SARS Coronavirus 2 by RT PCR NEGATIVE NEGATIVE   Influenza A by PCR NEGATIVE NEGATIVE   Influenza B by PCR NEGATIVE NEGATIVE  Brain natriuretic peptide     Status: None   Collection Time: 03/06/20  7:25 AM  Result Value Ref Range   B Natriuretic Peptide 36.0 0.0 - 100.0 pg/mL  Blood gas, venous     Status: Abnormal  (Preliminary result)   Collection Time: 03/06/20  8:05 AM  Result Value Ref Range   pH, Ven 7.23 (L) 7.250 - 7.430   pCO2, Ven 60 44.0 - 60.0 mmHg   pO2, Ven PENDING 32.0 - 45.0 mmHg   Bicarbonate 25.1 20.0 - 28.0 mmol/L   Acid-base deficit 3.5 (H) 0.0 - 2.0 mmol/L   O2 Saturation 26.2 %   Patient temperature 37.0    Collection site VEIN    Sample type VEIN    Mechanical Rate VEIN    ____________________________________________  EKG My review and personal interpretation at Time: 6:53   Indication: ams  Rate: 100  Rhythm: sinus Axis: normal Other: nonspecific st abn, no stemi ____________________________________________  RADIOLOGY  I personally reviewed all radiographic images ordered to evaluate for the above acute complaints and reviewed radiology reports and findings.  These findings were personally discussed with the patient.  Please see medical record for radiology report.  ____________________________________________   PROCEDURES  Procedure(s)  performed:  .Critical Care Performed by: Merlyn Lot, MD Authorized by: Merlyn Lot, MD   Critical care provider statement:    Critical care time (minutes):  34   Critical care time was exclusive of:  Separately billable procedures and treating other patients   Critical care was necessary to treat or prevent imminent or life-threatening deterioration of the following conditions:  Renal failure   Critical care was time spent personally by me on the following activities:  Development of treatment plan with patient or surrogate, discussions with consultants, evaluation of patient's response to treatment, examination of patient, obtaining history from patient or surrogate, ordering and performing treatments and interventions, ordering and review of laboratory studies, ordering and review of radiographic studies, pulse oximetry, re-evaluation of patient's condition and review of old charts      Critical Care performed:  yes ____________________________________________   INITIAL IMPRESSION / [place] / ED COURSE  Pertinent labs & imaging results that were available during my care of the patient were reviewed by me and considered in my medical decision making (see chart for details).   DDX: Dehydration, sepsis, pna, uti, hypoglycemia, cva, drug effect, withdrawal, encephalitis   Alezandra L Minnifield is a 74 y.o. who presents to the ED with symptoms as described above presenting with altered mental status.  Patient very poor historian.  She is mildly hypotensive not febrile.  The patient will be placed on continuous pulse oximetry and telemetry for monitoring.  Laboratory evaluation will be sent to evaluate for the above complaints.     Clinical Course as of Mar 07 1011  Mon Mar 06, 2020  0801 Patient becoming more drowsy again.  Soft blood pressures.  This may be polypharmacy will trial Narcan.  Will give IV fluids.   [PR]  [phone] Did have mild response to Narcan but patient still very drowsy.  Blood work does show evidence of AKI.  Borderline elevated potassium.  We will continue with IV fluids as that is helping her pressure.  Nurses report that she did have dirty appearing urine.  No white count.  No fever but does have a low-grade temperature.  No sign of pneumonia or consolidation on chest x-ray.  We will continue with IV fluids.   [PR]  [id number] Urine does appear infected.  Will order IV Rocephin.  Given her illness will order CT renal to exclude obstructive uropathy.   [PR]  1008 CT imaging shows no evidence of obstructive uropathy.  We will continue with IV fluids.  I did give dose about says she does have mildly elevated potassium but is not having specific EKG changes.  We will continue with IV hydration which I suspect will clear this.  She is receiving IV antibiotics.  Will discuss with hospitalist.   [PR]    Clinical Course User Index [PR] Merlyn Lot, MD    The patient was evaluated in  Emergency Department today for the symptoms described in the history of present illness. He/she was evaluated in the context of the global COVID-19 pandemic, which necessitated consideration that the patient might be at risk for infection with the SARS-CoV-2 virus that causes COVID-19. Institutional protocols and algorithms that pertain to the evaluation of patients at risk for COVID-19 are in a state of rapid change based on information released by regulatory bodies including the CDC and federal and state organizations. These policies and algorithms were followed during the patient's care in the ED.  As part of my medical decision making, I reviewed  the following data within the [facility] notes reviewed and incorporated, Labs reviewed, notes from prior ED visits and [place] Controlled Substance Database   ____________________________________________   FINAL CLINICAL IMPRESSION(S) / ED DIAGNOSES  Final diagnoses:  Altered mental status, unspecified altered mental status type  Acute cystitis without hematuria  AKI (acute kidney injury) ([place])      NEW MEDICATIONS STARTED DURING THIS VISIT:  New Prescriptions   No medications on file     Note:  This document was prepared using Dragon voice recognition software and may include unintentional dictation errors.    Merlyn Lot, MD 03/06/20 1012

## 2020-03-06 NOTE — ED Notes (Addendum)
This RN at bedside due to pt being disconnected from monitor. Pt attempting to get out of bed stating, I need to pee. This RN assured pt she had a device called a purewick on that would allow her to use the bathroom without getting up. Pt verbalized understanding. Pt reconnected to monitor, bed alarm hooked up and pt went back to sleep at this time.

## 2020-03-06 NOTE — H&P (Signed)
History and Physical    [name] [id number] DOB: 1946-04-20 DOA: 03/06/2020  PCP: Remi Haggard, FNP   Patient coming from: Home  I have personally briefly reviewed patient's old medical records in [place]  Chief Complaint: Altered mental status Most of the history was obtained from the ER notes.  HPI: [name] is a 74 y.o. female with medical history significant for CAD with history of LAD stent 2019, anxiety, depression and chronic pain on several psychotropic agents, OSA intolerant of CPAP, history of diabetes, hypothyroidism, hypertension, HLD who was brought into the emergency room for evaluation of altered mental status and frequent falls.  Patient was found at the base of her recliner and noted to have a contusion to the back of her head.  She was found to be hypotensive with systolic blood pressure in the 90s upon arrival to the ER and responded to IV fluid resuscitation in the emergency room and she was also hypoxic on room air with pulse oximetry in the 80s requiring oxygen supplementation at 2 L. She had a CT scan of the head done without contrast which showed no evidence of acute intracranial abnormality.Stable, mild generalized parenchymal atrophy. Chest x-ray shows low lung volumes without radiographic evidence of acute cardiopulmonary disease. Chronic elevation of the right hemidiaphragm is unchanged. Labs reveal a serum creatinine of 3.42 above her baseline of 0.90 with slightly elevated potassium at 5.3 and a normal serum bicarbonate level.  She also has pyuria.  ED Course: Patient is a 74 year old female who presents to the emergency room for evaluation of altered mental status.  She was hypotensive in the emergency room and responded to IV fluid resuscitation.  She was also hypoxic with pulse oximetry in the 80s that responded to oxygen supplementation.  Review of medications shows patient is on multiple pain medications and polypharmacy was thought  to be the etiology of her altered mental status.  She received a dose of Narcan in the emergency room and also received a dose of IV Rocephin.  Labs revealed a creatinine of 3.4 above her baseline of 0.90 as well as mild hyperkalemia of 5.3. She had a CT scan of the abdomen and pelvis which was negative for obstructive uropathy She will be admitted to the hospital for further evaluation.   Review of Systems: As per HPI otherwise 10 point review of systems negative.    Past Medical History:  Diagnosis Date  . Amnesia   . Anxiety   . Arthritis   . Back pain   . CHF (congestive heart failure) ([place])   . Chronic pain syndrome   . Cystocele   . Depression   . Diabetes mellitus without complication ([place])   . GERD (gastroesophageal reflux disease)   . Hyperlipidemia   . Hypertension   . IBS (irritable bowel syndrome)   . Myocardial infarction ([place])   . Obesity   . Osteoporosis   . Osteoporosis   . Shortness of breath dyspnea   . Sleep apnea    not using now  . Thyroid nodule     Past Surgical History:  Procedure Laterality Date  . ABDOMINAL HYSTERECTOMY    . APPENDECTOMY    . CARDIAC CATHETERIZATION    . CHOLECYSTECTOMY    . CORONARY STENT INTERVENTION N/A 05/05/2018   Procedure: CORONARY STENT INTERVENTION;  Surgeon: Isaias Cowman, MD;  Location: [facility];  Service: Cardiovascular;  Laterality: N/A;  . FOOT SURGERY    . LEFT  HEART CATH AND CORONARY ANGIOGRAPHY Left 05/05/2018   Procedure: LEFT HEART CATH AND CORONARY ANGIOGRAPHY;  Surgeon: Isaias Cowman, MD;  Location: [facility];  Service: Cardiovascular;  Laterality: Left;  . OOPHORECTOMY    . REPLACEMENT TOTAL KNEE    . TONSILLECTOMY       reports that she has never smoked. She has never used smokeless tobacco. She reports that she does not drink alcohol or use drugs.  No Known Allergies  Family History  Problem Relation Age of Onset  . Cancer Mother   . Cancer Father   . Cancer  Sister   . Thyroid disease Neg Hx      Prior to Admission medications   Medication Sig Start Date End Date Taking? Authorizing Provider  albuterol (VENTOLIN HFA) 108 (90 Base) MCG/ACT inhaler Inhale 2 puffs into the lungs every 4 (four) hours as well digoxin hypertension continue home Alvy Bimler shortness of breath or wheezing. 12/12/19  Yes [provider]  aspirin EC 81 MG tablet Take 81 mg by mouth daily.   Yes [provider]  atorvastatin (LIPITOR) 40 MG tablet Take 40 mg by mouth at bedtime. 12/06/19  Yes [provider]  Buprenorphine HCl (BELBUCA) 300 MCG FILM Place 1 Film inside cheek 2 (two) times daily.   Yes [provider]  busPIRone (BUSPAR) 30 MG tablet Take 30 mg by mouth 2 (two) times daily.   Yes [provider]  Cholecalciferol (VITAMIN D3) 1000 units CAPS Take 1,000 Units by mouth daily.    Yes [provider]  cyclobenzaprine (FLEXERIL) 5 MG tablet Take 1 tablet (5 mg total) by mouth daily. 02/08/20  Yes Lorella Nimrod, MD  doxepin (SINEQUAN) 10 MG capsule Take 1 capsule (10 mg total) by mouth at bedtime. 02/07/20  Yes Lorella Nimrod, MD  DULoxetine (CYMBALTA) 60 MG capsule Take 60 mg by mouth daily.   Yes [provider]  fluticasone (FLONASE) 50 MCG/ACT nasal spray Place 2 sprays into both nostrils daily as needed for allergies or rhinitis.  01/27/20  Yes [provider]  furosemide (LASIX) 20 MG tablet Take 20 mg by mouth 2 (two) times daily as needed for fluid or edema.  01/28/20  Yes [provider]  gabapentin (NEURONTIN) 300 MG capsule Take 1 capsule (300 mg total) by mouth 3 (three) times daily. 02/07/20  Yes Lorella Nimrod, MD  LANTUS SOLOSTAR 100 UNIT/ML Solostar Pen Inject 75 Units into the skin at bedtime.    Yes [provider]  levothyroxine (SYNTHROID) 75 MCG tablet Take 1 tablet (75 mcg total) by mouth daily before breakfast. 02/08/20  Yes Lorella Nimrod, MD  lisinopril (ZESTRIL) 40  MG tablet Take 1 tablet (40 mg total) by mouth daily. 02/08/20  Yes Lorella Nimrod, MD  metFORMIN (GLUCOPHAGE-XR) 500 MG 24 hr tablet Take 1,000 mg by mouth 2 (two) times daily. 01/18/20  Yes [provider]  metoprolol succinate (TOPROL-XL) 50 MG 24 hr tablet Take 50 mg by mouth 2 (two) times daily.    Yes [provider]  nystatin (MYCOSTATIN) powder Apply 1 application topically 2 (two) times daily as needed (rashy areas in skin folds).    Yes [provider]  ondansetron (ZOFRAN) 4 MG tablet Take 4 mg by mouth every 8 (eight) hours as needed for nausea/vomiting. 01/28/20  Yes [provider]  Oxycodone HCl 10 MG TABS Take 10 mg by mouth 4 (four) times daily as needed for severe pain. 02/02/20  Yes [provider]  QUEtiapine (SEROQUEL) 200 MG tablet Take 100-200 mg by mouth at bedtime. 01/20/20  Yes [provider]  topiramate (TOPAMAX) 50 MG tablet Take 1 tablet (50 mg total) by mouth daily. 02/08/20  Yes Lorella Nimrod, MD  zolpidem (AMBIEN) 10 MG tablet Take 10 mg by mouth at bedtime as needed for sleep. 01/31/20  Yes [provider]  clonazePAM (KLONOPIN) 0.5 MG tablet Take 1 tablet (0.5 mg total) by mouth daily. 02/07/20   Lorella Nimrod, MD    Physical Exam: Vitals:   03/06/20 1115 03/06/20 1215 03/06/20 1230 03/06/20 1246  BP:      Pulse: 76 78 71 76  Resp: 13 15 13 14   Temp:      TempSrc:      SpO2: 99% 100% 97% 98%     Vitals:   03/06/20 1115 03/06/20 1215 03/06/20 1230 03/06/20 1246  BP:      Pulse: 76 78 71 76  Resp: 13 15 13 14   Temp:      TempSrc:      SpO2: 99% 100% 97% 98%    Constitutional: NAD, alert and oriented x 3. Awake.  Morbidly obese Eyes: PERRL, lids and conjunctivae normal ENMT: Mucous membranes are moist.  Neck: normal, supple, no masses, no thyromegaly Respiratory: Air movement in all lung fields, no wheezing, no crackles. Normal respiratory effort. No accessory muscle use.  Cardiovascular: Regular  rate and rhythm, no murmurs / rubs / gallops. No extremity edema. 2+ pedal pulses. No carotid bruits.  Abdomen: no tenderness, no masses palpated. No hepatosplenomegaly. Bowel sounds positive.  Musculoskeletal: no clubbing / cyanosis. No joint deformity upper and lower extremities.  Skin: no rashes, lesions, ulcers.  Neurologic: No gross focal neurologic deficit. Psychiatric: Normal mood and affect.   Labs on Admission: I have personally reviewed following labs and imaging studies  CBC: Recent Labs  Lab 03/06/20 0725  WBC 8.0  NEUTROABS 5.2  HGB 11.0*  HCT 35.4*  MCV 92.2  PLT [id number]   Basic Metabolic Panel: Recent Labs  Lab 03/06/20 0725  NA 139  K 5.3*  CL 106  CO2 24  GLUCOSE 198*  BUN 48*  CREATININE 3.42*  CALCIUM 8.9   GFR: CrCl cannot be calculated (Unknown ideal weight.). Liver Function Tests: Recent Labs  Lab 03/06/20 0725  AST 23  ALT 18  ALKPHOS 61  BILITOT 0.6  PROT 7.1  ALBUMIN 4.1   No results for input(s): LIPASE, AMYLASE in the last 168 hours. No results for input(s): AMMONIA in the last 168 hours. Coagulation Profile: No results for input(s): INR, PROTIME in the last 168 hours. Cardiac Enzymes: No results for input(s): CKTOTAL, CKMB, CKMBINDEX, TROPONINI in the last 168 hours. BNP (last 3 results) No results for input(s): PROBNP in the last 8760 hours. HbA1C: No results for input(s): HGBA1C in the last 72 hours. CBG: No results for input(s): GLUCAP in the last 168 hours. Lipid Profile: No results for input(s): CHOL, HDL, LDLCALC, TRIG, CHOLHDL, LDLDIRECT in the last 72 hours. Thyroid Function Tests: No results for input(s): TSH, T4TOTAL, FREET4, T3FREE, THYROIDAB in the last 72 hours. Anemia Panel: No results for input(s): VITAMINB12, FOLATE, FERRITIN, TIBC, IRON, RETICCTPCT in the last 72 hours. Urine analysis:    Component Value Date/Time   COLORURINE YELLOW 03/06/2020 0725   APPEARANCEUR CLOUDY (A) 03/06/2020 0725   APPEARANCEUR  Cloudy (A) 07/26/2016 1119   LABSPEC 1.025 03/06/2020 0725   LABSPEC 1.024 01/30/2014 0026   PHURINE 5.5  03/06/2020 0725   GLUCOSEU NEGATIVE 03/06/2020 0725   GLUCOSEU >=500 01/30/2014 0026   HGBUR MODERATE (A) 03/06/2020 0725   BILIRUBINUR NEGATIVE 03/06/2020 0725   BILIRUBINUR Negative 07/26/2016 1119   BILIRUBINUR Negative 01/30/2014 0026   KETONESUR NEGATIVE 03/06/2020 0725   PROTEINUR >300 (A) 03/06/2020 0725   UROBILINOGEN 1.0 07/11/2008 1245   NITRITE NEGATIVE 03/06/2020 0725   LEUKOCYTESUR LARGE (A) 03/06/2020 0725   LEUKOCYTESUR Trace 01/30/2014 0026    Radiological Exams on Admission: CT Head Wo Contrast  Result Date: 03/06/2020 CLINICAL DATA:  Head trauma, headache. Additional history provided: Found down. EXAM: CT HEAD WITHOUT CONTRAST CT CERVICAL SPINE WITHOUT CONTRAST TECHNIQUE: Multidetector CT imaging of the head and cervical spine was performed following the standard protocol without intravenous contrast. Multiplanar CT image reconstructions of the cervical spine were also generated. COMPARISON:  Head CT 02/05/2020, cervical spine radiographs 10/25/2008, report from cervical spine CT 07/11/2008 (images unavailable). FINDINGS: CT HEAD FINDINGS Brain: Stable, mild generalized parenchymal atrophy. There is no acute intracranial hemorrhage. No demarcated cortical infarct. No extra-axial fluid collection. No evidence of intracranial mass. No midline shift. Vascular: No hyperdense vessel. Skull: Normal. Negative for fracture or focal lesion. Sinuses/Orbits: Visualized orbits show no acute finding. No significant paranasal sinus disease or mastoid effusion at the imaged levels. CT CERVICAL SPINE FINDINGS Evaluation limited by mild to moderate motion degradation, most notably at the C4 level. Alignment: No significant spondylolisthesis. Skull base and vertebrae: The basion-dental and atlanto-dental intervals are maintained.No evidence of acute fracture to the cervical spine. Soft tissues  and spinal canal: No prevertebral fluid or swelling. No visible canal hematoma. Disc levels: Cervical spondylosis without high-grade bony spinal canal narrowing. Upper chest: No consolidation within the imaged lung apices. No visible pneumothorax. IMPRESSION: CT head: 1. No evidence of acute intracranial abnormality. 2. Stable, mild generalized parenchymal atrophy. CT cervical spine: 1. Evaluation limited by mild to moderate motion degradation. 2. No acute cervical spine fracture is identified. Electronically Signed   By: Kellie Simmering DO   On: 03/06/2020 08:04   CT Cervical Spine Wo Contrast  Result Date: 03/06/2020 CLINICAL DATA:  Head trauma, headache. Additional history provided: Found down. EXAM: CT HEAD WITHOUT CONTRAST CT CERVICAL SPINE WITHOUT CONTRAST TECHNIQUE: Multidetector CT imaging of the head and cervical spine was performed following the standard protocol without intravenous contrast. Multiplanar CT image reconstructions of the cervical spine were also generated. COMPARISON:  Head CT 02/05/2020, cervical spine radiographs 10/25/2008, report from cervical spine CT 07/11/2008 (images unavailable). FINDINGS: CT HEAD FINDINGS Brain: Stable, mild generalized parenchymal atrophy. There is no acute intracranial hemorrhage. No demarcated cortical infarct. No extra-axial fluid collection. No evidence of intracranial mass. No midline shift. Vascular: No hyperdense vessel. Skull: Normal. Negative for fracture or focal lesion. Sinuses/Orbits: Visualized orbits show no acute finding. No significant paranasal sinus disease or mastoid effusion at the imaged levels. CT CERVICAL SPINE FINDINGS Evaluation limited by mild to moderate motion degradation, most notably at the C4 level. Alignment: No significant spondylolisthesis. Skull base and vertebrae: The basion-dental and atlanto-dental intervals are maintained.No evidence of acute fracture to the cervical spine. Soft tissues and spinal canal: No prevertebral fluid  or swelling. No visible canal hematoma. Disc levels: Cervical spondylosis without high-grade bony spinal canal narrowing. Upper chest: No consolidation within the imaged lung apices. No visible pneumothorax. IMPRESSION: CT head: 1. No evidence of acute intracranial abnormality. 2. Stable, mild generalized parenchymal atrophy. CT cervical spine: 1. Evaluation limited by mild to moderate motion degradation. 2.  No acute cervical spine fracture is identified. Electronically Signed   By: Kellie Simmering DO   On: 03/06/2020 08:04   DG Chest Portable 1 View  Result Date: 03/06/2020 CLINICAL DATA:  74 year old female with history of altered mental status. EXAM: PORTABLE CHEST 1 VIEW COMPARISON:  Chest x-ray 02/05/2020. FINDINGS: Lung volumes are low. Chronic elevation of the right hemidiaphragm is unchanged. No definite acute consolidative airspace disease. No pleural effusions. No pneumothorax. No evidence of pulmonary edema. Heart size is normal. Numerous bilateral calcified mediastinal and hilar lymph nodes. Upper mediastinal contours are otherwise within normal limits. Aortic atherosclerosis. IMPRESSION: 1. Low lung volumes without radiographic evidence of acute cardiopulmonary disease. 2. Chronic elevation of the right hemidiaphragm is unchanged. Electronically Signed   By: Vinnie Langton M.D.   On: 03/06/2020 08:02   CT Renal Stone Study  Result Date: 03/06/2020 CLINICAL DATA:  Flank pain.  Lethargy. EXAM: CT ABDOMEN AND PELVIS WITHOUT CONTRAST TECHNIQUE: Multidetector CT imaging of the abdomen and pelvis was performed following the standard protocol without oral or IV contrast. COMPARISON:  Mar 24, 2017 FINDINGS: Lower chest: There is bibasilar atelectatic change. Stable 2 mm nodular opacity in the lateral right base seen on axial slice 1 series 4. Lung bases otherwise clear. There are foci of coronary artery calcification. There is calcification in the left hilar lymph node consistent with prior granulomatous  disease. Hepatobiliary: No appreciable liver lesions evident on this noncontrast enhanced study. The gallbladder is absent. Common bile duct measures up to 1.2 cm, dilated. No appreciable intrahepatic biliary duct dilatation. No biliary duct mass or calculus is evident on this study. Pancreas: There is no pancreatic mass or inflammatory focus. Spleen: There are a few tiny cysts iliac granulomas. Spleen otherwise appears normal on this noncontrast enhanced study. Adrenals/Urinary Tract: Adrenals bilaterally appear normal. Kidneys bilaterally demonstrate no evident mass or hydronephrosis on either side. There is no evident renal or ureteral calculus on either side. Urinary bladder is midline with wall thickness within normal limits. Stomach/Bowel: There are sigmoid diverticula without diverticulitis, similar to prior study. There is no appreciable bowel wall or mesenteric thickening. Terminal ileum appears unremarkable. There is no evident bowel obstruction. There is no free air or portal venous air. Vascular/Lymphatic: There is no abdominal aortic aneurysm. There is aortic atherosclerotic calcification. There is calcification in the proximal left renal artery, also present previously. No evident adenopathy in the abdomen or pelvis. Reproductive: Uterus absent.  No pelvic mass. Other: Appendix absent. No periappendiceal region inflammation. No evident abscess or ascites in the abdomen or pelvis. Musculoskeletal: Postoperative change noted at L4-5. Compression fracture of L1 is stable. No blastic or lytic bone lesions evident. There is degenerative change in the lumbar spine. There is no appreciable intramuscular or abdominal wall lesion. IMPRESSION: 1. No renal or ureteral calculus. No hydronephrosis on either side. Urinary bladder is midline with wall thickness within normal limits. 2. Sigmoid diverticulosis without diverticulitis. No bowel wall thickening or bowel obstruction. No abscess in the abdomen or pelvis.  Appendix absent. No periappendiceal region inflammation. 3. Gallbladder absent. Common bile duct dilatation noted without obstructing focus evident by CT. If further assessment is felt to be warranted, MRCP would be the optimum study of choice to further evaluate the biliary ductal system. 4.  Evidence of prior granulomatous disease. 5. Aortic Atherosclerosis (ICD10-I70.0). Multiple foci of coronary artery calcification noted. 6.  Uterus absent. 7. Stable compression fracture L1. Postoperative change L4-5 noted. Electronically Signed   By: Lowella Grip  III M.D.   On: 03/06/2020 09:51    EKG: Independently reviewed.  Sinus tachycardia  Assessment/Plan Principal Problem:   AKI (acute kidney injury) ([place]) Active Problems:   Diabetes mellitus (HCC)   Anxiety and depression   GERD (gastroesophageal reflux disease)   Acute metabolic encephalopathy   Acute respiratory failure with hypoxia (HCC)   Polypharmacy   Hyperkalemia    Acute kidney injury  Most likely from ATN related to hypotension Hold all antihypertensive medications for now Aggressive IV fluid hydration Repeat renal parameters in a.m. and if no improvement will consult nephrology   Hyperkalemia Mild with no EKG changes Patient received a dose of Veltassa 16.8g  in the emergency room We will monitor closely   Urinary tract infection Patient with significant pyuria Prior urine culture yielded ESBL E. Coli We will treat patient empirically with meropenem until culture results become available. Will de-escalate antibiotic therapy once urine culture results become   Acute metabolic encephalopathy versus toxic metabolic encephalopathy Patient presents for evaluation of altered mental status and responded to Narcan in the emergency room CT head was negative Patient noted to have acute kidney injury as well as a UTI which could all cause encephalopathy but she is also on multiple psychotropic agents which will be placed on  hold Hold psychotropic agents, BuSpar , Klonopin, Flexeril, Sinequan, Neurontin, Topamax Fall and aspiration precautions     Acute respiratory failure with hypoxia ([place])  ??  Obesity hypoventilation syndrome versus secondary to polypharmacy Patient will need to be assessed for home oxygen need prior to discharge     Anxiety and depression   Chronic pain syndrome Holding home meds for now due to altered mental status     Diabetes mellitus with complication (HCC) Sliding scale coverage with Novolog Continue scheduled Lantus Consistent carbohydrate diet     Essential hypertension Hold metoprolol and lisinopril due to relative hypotension      CAD (coronary artery disease) History of DES to proximal LAD in 2019 Continue aspirin and statins     OSA (obstructive sleep apnea)   CPAP ventilation treatment not tolerated -Patient states she could not tolerate the mask    Morbid obesity with BMI of 45.0-49.9, adult ([place]) -This complicates overall prognosis and care   Hypothyroidism Continue Synthroid    Chronic diastolic dysfunction CHF Hold furosemide, metoprolol and lisinopril due to relative hypotension.    DVT prophylaxis: Heparin Code Status: DO NOT RESUSCITATE Family Communication: Greater than 50% of time was spent discussing patient's condition and plan of care with her at the bedside.  All questions and concerns were addressed.  She verbalizes understanding and agrees with the plan.  CODE STATUS was discussed and she wants to be placed in a DO NOT RESUSCITATE status. Disposition Plan: Back to previous home environment Consults called: Physical therapy    Zeppelin Beckstrand MD [facility]     03/06/2020, 12:50 PM

## 2020-03-06 NOTE — ED Notes (Signed)
Pt on phone with daughter

## 2020-03-06 NOTE — ED Notes (Signed)
Report to Kelly, RN.

## 2020-03-06 NOTE — ED Notes (Signed)
Ready bed @ [id number], spoke with RN Claiborne Billings

## 2020-03-06 NOTE — ED Notes (Signed)
This RN called lab to add on urine culture to urine sent.

## 2020-03-06 NOTE — ED Notes (Signed)
This RN called lab again to follow up on urine culture. Lab stating they are busy with STAT orders and will get to the add on urine culture as soon as they can.

## 2020-03-06 NOTE — Progress Notes (Signed)
Pharmacy Antibiotic Note  [name] is a 74 y.o. female admitted on 03/06/2020 with UTI.  Pharmacy has been consulted for meropenem dosing.  Weight 129.7 kg in Care Everywhere (03/02/20) AdjBW CrCl ~20 ml/min  Plan: Meropenem 500 mg IV q12h      Temp (24hrs), Avg:99.1 F (37.3 C), Min:99.1 F (37.3 C), Max:99.1 F (37.3 C)  Recent Labs  Lab 03/06/20 0725  WBC 8.0  CREATININE 3.42*  LATICACIDVEN 1.7    CrCl cannot be calculated (Unknown ideal weight.).    No Known Allergies  Antimicrobials this admission: Ceftriaxone 5/3 x1 Meropenem 5/3 >>  Dose adjustments this admission:   Microbiology results:   Thank you for allowing pharmacy to be a part of this patient's care.  Rocky Morel 03/06/2020 1:56 PM

## 2020-03-06 NOTE — ED Notes (Signed)
This RN at bedside due to oxygen sats dropping to mid 80s. Pt [place] off. [place] placed back on pt at this time. Oxygen sats improved to 98%.

## 2020-03-06 NOTE — ED Notes (Signed)
Pt to CT

## 2020-03-06 NOTE — ED Notes (Signed)
Placed on 2L [place], 94%. Taken to CT.

## 2020-03-06 NOTE — ED Notes (Signed)
Pt awake and alert. Pt provided with meal tray at this time. Pt denies any further needs.

## 2020-03-06 NOTE — ED Notes (Signed)
ABX not available in pyxis. Message sent to pharmacy to send to ED.

## 2020-03-07 DIAGNOSIS — N179 Acute kidney failure, unspecified: Principal | ICD-10-CM

## 2020-03-07 LAB — CBC
HCT: 37.5 % (ref 36.0–46.0)
Hemoglobin: 11.7 g/dL — ABNORMAL LOW (ref 12.0–15.0)
MCH: 28.5 pg (ref 26.0–34.0)
MCHC: 31.2 g/dL (ref 30.0–36.0)
MCV: 91.2 fL (ref 80.0–100.0)
Platelets: 144 10*3/uL — ABNORMAL LOW (ref 150–400)
RBC: 4.11 MIL/uL (ref 3.87–5.11)
RDW: 15.3 % (ref 11.5–15.5)
WBC: 7.2 10*3/uL (ref 4.0–10.5)
nRBC: 0 % (ref 0.0–0.2)

## 2020-03-07 LAB — BASIC METABOLIC PANEL
Anion gap: 8 (ref 5–15)
BUN: 44 mg/dL — ABNORMAL HIGH (ref 8–23)
CO2: 24 mmol/L (ref 22–32)
Calcium: 10.1 mg/dL (ref 8.9–10.3)
Chloride: 108 mmol/L (ref 98–111)
Creatinine, Ser: 1.91 mg/dL — ABNORMAL HIGH (ref 0.44–1.00)
GFR calc Af Amer: 30 mL/min — ABNORMAL LOW (ref >60)
GFR calc non Af Amer: 26 mL/min — ABNORMAL LOW (ref >60)
Glucose, Bld: 168 mg/dL — ABNORMAL HIGH (ref 70–99)
Potassium: 5 mmol/L (ref 3.5–5.1)
Sodium: 140 mmol/L (ref 135–145)

## 2020-03-07 LAB — GLUCOSE, CAPILLARY
Glucose-Capillary: 142 mg/dL — ABNORMAL HIGH (ref 70–99)
Glucose-Capillary: 182 mg/dL — ABNORMAL HIGH (ref 70–99)
Glucose-Capillary: 228 mg/dL — ABNORMAL HIGH (ref 70–99)
Glucose-Capillary: 254 mg/dL — ABNORMAL HIGH (ref 70–99)

## 2020-03-07 MED ORDER — QUETIAPINE FUMARATE 100 MG PO TABS
100.0000 mg | ORAL_TABLET | Freq: Every day | ORAL | Status: DC
  Administered 2020-03-07: 23:00:00 100 mg via ORAL
  Filled 2020-03-07: qty 4
  Filled 2020-03-07 (×2): qty 1

## 2020-03-07 MED ORDER — METOPROLOL SUCCINATE ER 50 MG PO TB24
50.0000 mg | ORAL_TABLET | Freq: Two times a day (BID) | ORAL | Status: DC
  Administered 2020-03-07 – 2020-03-08 (×3): 50 mg via ORAL
  Filled 2020-03-07 (×3): qty 1

## 2020-03-07 MED ORDER — SODIUM CHLORIDE 0.9 % IV SOLN
1.0000 g | Freq: Two times a day (BID) | INTRAVENOUS | Status: DC
  Administered 2020-03-07: 11:00:00 1 g via INTRAVENOUS
  Filled 2020-03-07 (×3): qty 1

## 2020-03-07 MED ORDER — DULOXETINE HCL 60 MG PO CPEP
60.0000 mg | ORAL_CAPSULE | Freq: Every day | ORAL | Status: DC
  Administered 2020-03-07 – 2020-03-08 (×2): 60 mg via ORAL
  Filled 2020-03-07 (×2): qty 2
  Filled 2020-03-07 (×2): qty 1

## 2020-03-07 MED ORDER — LACTATED RINGERS IV SOLN: INTRAVENOUS | Status: AC

## 2020-03-07 MED ORDER — SODIUM CHLORIDE 0.9 % IV SOLN
1.0000 g | INTRAVENOUS | Status: DC
  Administered 2020-03-07: 20:00:00 1 g via INTRAVENOUS
  Filled 2020-03-07: qty 1

## 2020-03-07 MED ORDER — BUSPIRONE HCL 15 MG PO TABS
30.0000 mg | ORAL_TABLET | Freq: Two times a day (BID) | ORAL | Status: DC
  Administered 2020-03-07 – 2020-03-08 (×3): 30 mg via ORAL
  Filled 2020-03-07 (×5): qty 2

## 2020-03-07 NOTE — Progress Notes (Signed)
PROGRESS NOTE    CAMBRIA UMEDA  [id number] DOB: 08-31-1946 DOA: 03/06/2020 PCP: Remi Haggard, FNP    Assessment & Plan:   Principal Problem:   AKI (acute kidney injury) ([place]) Active Problems:   Diabetes mellitus ([place])   Anxiety and depression   GERD (gastroesophageal reflux disease)   Acute metabolic encephalopathy   Acute respiratory failure with hypoxia ([place])   Polypharmacy   Hyperkalemia    [name] is a 74 y.o. Caucasian female with medical history significant forCAD with history of LAD stent 2019, anxiety, depression and chronic pain on several psychotropic agents, OSA intolerant of CPAP, history of diabetes, hypothyroidism, hypertension, HLD who was brought into the emergency room for evaluation of altered mental status and frequent falls.  Patient was found at the base of her recliner and noted to have a contusion to the back of her head.  She was found to be hypotensive with systolic blood pressure in the 90s upon arrival to the ER and responded to IV fluid resuscitation in the emergency room and she was also hypoxic on room air with pulse oximetry in the 80s requiring oxygen supplementation at 2 L.   AMS likely 2/2 polypharmacy Chronic pain on chronic opioids Anxiety and depression Patient presented for evaluation of altered mental status and responded to Narcan in the emergency room.  CT head was negative.  Multiple psychotropic agents on home med lists, however, pt and daughter said pt no longer takes buprenorphine, klonopin, and takes oxycodone 10 mg only once per day.  Although pt also said her house mate handles her her medications, so pt isn't sure what she was taking exactly. --Pt had a hospitalization from 4/3 to 02/07/20 for similar presentation of AMS secondary to new narcotic use and buprenorphine was d/c'ed at that time. PLAN: --continue home Buspar, Cumbalta, gabapentin, topamax --resume home Seroquel at 100 mg nightly --Hold home oxycodone --d/c  home buprenorphine, Klonopin and Ambien  Acute kidney injury  On presentation, creatinine of 3.42 above her baseline of 0.90.  Could be due to hypotension and dehydration.  Already improved with IVF on presentation. PLAN: --continue LR@100  for 10 hours  Hyperkalemia Mild with no EKG changes Patient received a dose of Veltassa 16.8g in the emergency room  Urinary tract infection Patient with significant pyuria and bacteruria.  Complained of mild burning with urination which is present most of the time.  No fever, leukocytosis.  Prior urine culture in 2018 yielded ESBL E. Coli, so pt was started on meropenem on admission, however, after 3 doses, still complained of burning with urination.  Query whether her urinary symptom is really due to UTI.  Other possibility include atrophic vaginitis. PLAN:. --Since pt does complain of dysuria, will treat with abx for now.  Switch back to ceftriaxone pending urine cx, since pt is not septic appearing.    Acute respiratory failure with hypoxia (H[facility]) --likely due to respiratory depression from her sedating medications.    Diabetes mellitus with complication ([facility]) Continue Lantus 75u nightly Continue mealtime 6u TID SSI TID, no bed-time coverage unless pt is eating at bed-time.  Essential hypertension Sinus tachycardia --resume home metoprolol  --Hold lisinopril and lasix due to AKI  CAD (coronary artery disease) History of DES to proximal LAD in 2019 Continue aspirin and statin  OSA (obstructive sleep apnea) CPAP ventilation treatment not tolerated -Patient states she could not tolerate the mask  Morbid obesity with BMI of 45.0-49.9, adult ([place]) -This complicates overall prognosis and care  Hypothyroidism Continue Synthroid  Chronic diastolic dysfunction CHF --resume home metoprolol  --Hold lisinopril and lasix due to AKI   DVT prophylaxis: Heparin SQ Code Status: DNR  Family Communication: daughter  updated at bedside today Status is: inpatient Dispo:   The patient is from: home Anticipated d/c is to: home Anticipated d/c date is: tomorrow Patient currently is not medically stable to d/c due to: IVF for AKI, monitor for kidney function recovery, and waiting on urine cx sensitivity.   Subjective and Interval History:  Pt was alert this morning, and conversant, though noted to be a little confused by staff.  When asked about urinary symptoms, pt said she had mild burning with urination, which is present most of the time, but no abdominal pain.  Pt said she gets UTI a lot because she was told she didn't clean herself well enough.  No fever, dyspnea, chest pain, N/V/D.  Pt initially wanted to leave AMA, however, after explaining her reasons for admission to her, pt agreed to stay.    Objective: Vitals:   03/07/20 1006 03/07/20 1036 03/07/20 1236 03/07/20 1501  BP:  (!) 146/67 126/75 129/64  Pulse: 97 90 (!) 104 99  Resp:  17  18  Temp:   98.3 F (36.8 C) 98.4 F (36.9 C)  TempSrc:   Oral Oral  SpO2:  99% 92% 97%  Weight:      Height:        Intake/Output Summary (Last 24 hours) at 03/07/2020 1803 Last data filed at 03/07/2020 1417 Gross per 24 hour  Intake 683.89 ml  Output --  Net 683.89 ml   Filed Weights   03/06/20 1428 03/06/20 2019  Weight: 136 kg 99.8 kg    Examination:   Constitutional: NAD, AAOx3 HEENT: conjunctivae and lids normal, EOMI CV: RRR no M,R,G. Distal pulses +2.  No cyanosis.   RESP: CTA B/L, normal respiratory effort  GI: +BS, NTND Extremities: No effusions, edema, or tenderness in BLE SKIN: warm, dry and intact Neuro: II - XII grossly intact.  Sensation intact Psych: Normal mood and affect.     Data Reviewed: I have personally reviewed following labs and imaging studies  CBC: Recent Labs  Lab 03/06/20 0725 03/07/20 0434  WBC 8.0 7.2  NEUTROABS 5.2  --   HGB 11.0* 11.7*  HCT 35.4* 37.5  MCV 92.2 91.2  PLT 159 144*   Basic  Metabolic Panel: Recent Labs  Lab 03/06/20 0725 03/07/20 0434  NA 139 140  K 5.3* 5.0  CL 106 108  CO2 24 24  GLUCOSE 198* 168*  BUN 48* 44*  CREATININE 3.42* 1.91*  CALCIUM 8.9 10.1   GFR: Estimated Creatinine Clearance: 30.6 mL/min (A) (by C-G formula based on SCr of 1.91 mg/dL (H)). Liver Function Tests: Recent Labs  Lab 03/06/20 0725  AST 23  ALT 18  ALKPHOS 61  BILITOT 0.6  PROT 7.1  ALBUMIN 4.1   No results for input(s): LIPASE, AMYLASE in the last 168 hours. No results for input(s): AMMONIA in the last 168 hours. Coagulation Profile: No results for input(s): INR, PROTIME in the last 168 hours. Cardiac Enzymes: No results for input(s): CKTOTAL, CKMB, CKMBINDEX, TROPONINI in the last 168 hours. BNP (last 3 results) No results for input(s): PROBNP in the last 8760 hours. HbA1C: No results for input(s): HGBA1C in the last 72 hours. CBG: Recent Labs  Lab 03/06/20 1756 03/06/20 2057 03/07/20 0809 03/07/20 1147 03/07/20 1702  GLUCAP 160* 144* 142* 182* 228*  Lipid Profile: No results for input(s): CHOL, HDL, LDLCALC, TRIG, CHOLHDL, LDLDIRECT in the last 72 hours. Thyroid Function Tests: No results for input(s): TSH, T4TOTAL, FREET4, T3FREE, THYROIDAB in the last 72 hours. Anemia Panel: No results for input(s): VITAMINB12, FOLATE, FERRITIN, TIBC, IRON, RETICCTPCT in the last 72 hours. Sepsis Labs: Recent Labs  Lab 03/06/20 0725  LATICACIDVEN 1.7    Recent Results (from the past 240 hour(s))  Respiratory Panel by RT PCR (Flu A&B, Covid) - Nasopharyngeal Swab     Status: None   Collection Time: 03/06/20  7:25 AM   Specimen: Nasopharyngeal Swab  Result Value Ref Range Status   SARS Coronavirus 2 by RT PCR NEGATIVE NEGATIVE Final    Comment: (NOTE) SARS-CoV-2 target nucleic acids are NOT DETECTED. The SARS-CoV-2 RNA is generally detectable in upper respiratoy specimens during the acute phase of infection. The lowest concentration of SARS-CoV-2 viral  copies this assay can detect is 131 copies/mL. A negative result does not preclude SARS-Cov-2 infection and should not be used as the sole basis for treatment or other patient management decisions. A negative result may occur with  improper specimen collection/handling, submission of specimen other than nasopharyngeal swab, presence of viral mutation(s) within the areas targeted by this assay, and inadequate number of viral copies (<131 copies/mL). A negative result must be combined with clinical observations, patient history, and epidemiological information. The expected result is Negative. Fact Sheet for Patients:  PinkCheek.be Fact Sheet for Healthcare Providers:  GravelBags.it This test is not yet ap proved or cleared by the [place] FDA and  has been authorized for detection and/or diagnosis of SARS-CoV-2 by FDA under an Emergency Use Authorization (EUA). This EUA will remain  in effect (meaning this test can be used) for the duration of the COVID-19 declaration under Section 564(b)(1) of the Act, 21 U.S.C. section 360bbb-3(b)(1), unless the authorization is terminated or revoked sooner.    Influenza A by PCR NEGATIVE NEGATIVE Final   Influenza B by PCR NEGATIVE NEGATIVE Final    Comment: (NOTE) The Xpert Xpress SARS-CoV-2/FLU/RSV assay is intended as an aid in  the diagnosis of influenza from Nasopharyngeal swab specimens and  should not be used as a sole basis for treatment. Nasal washings and  aspirates are unacceptable for Xpert Xpress SARS-CoV-2/FLU/RSV  testing. Fact Sheet for Patients: PinkCheek.be Fact Sheet for Healthcare Providers: GravelBags.it This test is not yet approved or cleared by the [place] FDA and  has been authorized for detection and/or diagnosis of SARS-CoV-2 by  FDA under an Emergency Use Authorization (EUA). This EUA will  remain  in effect (meaning this test can be used) for the duration of the  Covid-19 declaration under Section 564(b)(1) of the Act, 21  U.S.C. section 360bbb-3(b)(1), unless the authorization is  terminated or revoked. Performed at [facility], [address] [place], [place] [address]   Urine Culture     Status: Abnormal (Preliminary result)   Collection Time: 03/06/20  7:25 AM   Specimen: Urine, Clean Catch  Result Value Ref Range Status   Specimen Description   Final    URINE, CLEAN CATCH Performed at [facility], [address], [place], [place] [address]    Special Requests   Final    NONE Performed at [facility], [address] [place], [place] [address]    Culture >=100,000 COLONIES/mL ESCHERICHIA COLI (A)  Final   Report Status PENDING  Incomplete      Radiology Studies: CT Head  Wo Contrast  Result Date: 03/06/2020 CLINICAL DATA:  Head trauma, headache. Additional history provided: Found down. EXAM: CT HEAD WITHOUT CONTRAST CT CERVICAL SPINE WITHOUT CONTRAST TECHNIQUE: Multidetector CT imaging of the head and cervical spine was performed following the standard protocol without intravenous contrast. Multiplanar CT image reconstructions of the cervical spine were also generated. COMPARISON:  Head CT 02/05/2020, cervical spine radiographs 10/25/2008, report from cervical spine CT 07/11/2008 (images unavailable). FINDINGS: CT HEAD FINDINGS Brain: Stable, mild generalized parenchymal atrophy. There is no acute intracranial hemorrhage. No demarcated cortical infarct. No extra-axial fluid collection. No evidence of intracranial mass. No midline shift. Vascular: No hyperdense vessel. Skull: Normal. Negative for fracture or focal lesion. Sinuses/Orbits: Visualized orbits show no acute finding. No significant paranasal sinus disease or mastoid effusion at the imaged levels. CT CERVICAL SPINE FINDINGS Evaluation limited by mild to moderate motion  degradation, most notably at the C4 level. Alignment: No significant spondylolisthesis. Skull base and vertebrae: The basion-dental and atlanto-dental intervals are maintained.No evidence of acute fracture to the cervical spine. Soft tissues and spinal canal: No prevertebral fluid or swelling. No visible canal hematoma. Disc levels: Cervical spondylosis without high-grade bony spinal canal narrowing. Upper chest: No consolidation within the imaged lung apices. No visible pneumothorax. IMPRESSION: CT head: 1. No evidence of acute intracranial abnormality. 2. Stable, mild generalized parenchymal atrophy. CT cervical spine: 1. Evaluation limited by mild to moderate motion degradation. 2. No acute cervical spine fracture is identified. Electronically Signed   By: Kellie Simmering DO   On: 03/06/2020 08:04   CT Cervical Spine Wo Contrast  Result Date: 03/06/2020 CLINICAL DATA:  Head trauma, headache. Additional history provided: Found down. EXAM: CT HEAD WITHOUT CONTRAST CT CERVICAL SPINE WITHOUT CONTRAST TECHNIQUE: Multidetector CT imaging of the head and cervical spine was performed following the standard protocol without intravenous contrast. Multiplanar CT image reconstructions of the cervical spine were also generated. COMPARISON:  Head CT 02/05/2020, cervical spine radiographs 10/25/2008, report from cervical spine CT 07/11/2008 (images unavailable). FINDINGS: CT HEAD FINDINGS Brain: Stable, mild generalized parenchymal atrophy. There is no acute intracranial hemorrhage. No demarcated cortical infarct. No extra-axial fluid collection. No evidence of intracranial mass. No midline shift. Vascular: No hyperdense vessel. Skull: Normal. Negative for fracture or focal lesion. Sinuses/Orbits: Visualized orbits show no acute finding. No significant paranasal sinus disease or mastoid effusion at the imaged levels. CT CERVICAL SPINE FINDINGS Evaluation limited by mild to moderate motion degradation, most notably at the C4  level. Alignment: No significant spondylolisthesis. Skull base and vertebrae: The basion-dental and atlanto-dental intervals are maintained.No evidence of acute fracture to the cervical spine. Soft tissues and spinal canal: No prevertebral fluid or swelling. No visible canal hematoma. Disc levels: Cervical spondylosis without high-grade bony spinal canal narrowing. Upper chest: No consolidation within the imaged lung apices. No visible pneumothorax. IMPRESSION: CT head: 1. No evidence of acute intracranial abnormality. 2. Stable, mild generalized parenchymal atrophy. CT cervical spine: 1. Evaluation limited by mild to moderate motion degradation. 2. No acute cervical spine fracture is identified. Electronically Signed   By: Kellie Simmering DO   On: 03/06/2020 08:04   DG Chest Portable 1 View  Result Date: 03/06/2020 CLINICAL DATA:  74 year old female with history of altered mental status. EXAM: PORTABLE CHEST 1 VIEW COMPARISON:  Chest x-ray 02/05/2020. FINDINGS: Lung volumes are low. Chronic elevation of the right hemidiaphragm is unchanged. No definite acute consolidative airspace disease. No pleural effusions. No pneumothorax. No evidence of pulmonary edema. Heart size is normal.  Numerous bilateral calcified mediastinal and hilar lymph nodes. Upper mediastinal contours are otherwise within normal limits. Aortic atherosclerosis. IMPRESSION: 1. Low lung volumes without radiographic evidence of acute cardiopulmonary disease. 2. Chronic elevation of the right hemidiaphragm is unchanged. Electronically Signed   By: Vinnie Langton M.D.   On: 03/06/2020 08:02   CT Renal Stone Study  Result Date: 03/06/2020 CLINICAL DATA:  Flank pain.  Lethargy. EXAM: CT ABDOMEN AND PELVIS WITHOUT CONTRAST TECHNIQUE: Multidetector CT imaging of the abdomen and pelvis was performed following the standard protocol without oral or IV contrast. COMPARISON:  Mar 24, 2017 FINDINGS: Lower chest: There is bibasilar atelectatic change. Stable  2 mm nodular opacity in the lateral right base seen on axial slice 1 series 4. Lung bases otherwise clear. There are foci of coronary artery calcification. There is calcification in the left hilar lymph node consistent with prior granulomatous disease. Hepatobiliary: No appreciable liver lesions evident on this noncontrast enhanced study. The gallbladder is absent. Common bile duct measures up to 1.2 cm, dilated. No appreciable intrahepatic biliary duct dilatation. No biliary duct mass or calculus is evident on this study. Pancreas: There is no pancreatic mass or inflammatory focus. Spleen: There are a few tiny cysts iliac granulomas. Spleen otherwise appears normal on this noncontrast enhanced study. Adrenals/Urinary Tract: Adrenals bilaterally appear normal. Kidneys bilaterally demonstrate no evident mass or hydronephrosis on either side. There is no evident renal or ureteral calculus on either side. Urinary bladder is midline with wall thickness within normal limits. Stomach/Bowel: There are sigmoid diverticula without diverticulitis, similar to prior study. There is no appreciable bowel wall or mesenteric thickening. Terminal ileum appears unremarkable. There is no evident bowel obstruction. There is no free air or portal venous air. Vascular/Lymphatic: There is no abdominal aortic aneurysm. There is aortic atherosclerotic calcification. There is calcification in the proximal left renal artery, also present previously. No evident adenopathy in the abdomen or pelvis. Reproductive: Uterus absent.  No pelvic mass. Other: Appendix absent. No periappendiceal region inflammation. No evident abscess or ascites in the abdomen or pelvis. Musculoskeletal: Postoperative change noted at L4-5. Compression fracture of L1 is stable. No blastic or lytic bone lesions evident. There is degenerative change in the lumbar spine. There is no appreciable intramuscular or abdominal wall lesion. IMPRESSION: 1. No renal or ureteral  calculus. No hydronephrosis on either side. Urinary bladder is midline with wall thickness within normal limits. 2. Sigmoid diverticulosis without diverticulitis. No bowel wall thickening or bowel obstruction. No abscess in the abdomen or pelvis. Appendix absent. No periappendiceal region inflammation. 3. Gallbladder absent. Common bile duct dilatation noted without obstructing focus evident by CT. If further assessment is felt to be warranted, MRCP would be the optimum study of choice to further evaluate the biliary ductal system. 4.  Evidence of prior granulomatous disease. 5. Aortic Atherosclerosis (ICD10-I70.0). Multiple foci of coronary artery calcification noted. 6.  Uterus absent. 7. Stable compression fracture L1. Postoperative change L4-5 noted. Electronically Signed   By: Lowella Grip III M.D.   On: 03/06/2020 09:51     Scheduled Meds: . aspirin EC  81 mg Oral Daily  . atorvastatin  40 mg Oral QHS  . busPIRone  30 mg Oral BID  . DULoxetine  60 mg Oral Daily  . gabapentin  300 mg Oral TID  . heparin  5,000 Units Subcutaneous Q8H  . insulin aspart  0-20 Units Subcutaneous TID WC  . insulin aspart  6 Units Subcutaneous TID WC  . insulin glargine  75 Units Subcutaneous QHS  . ipratropium-albuterol  3 mL Nebulization Once  . ipratropium-albuterol  3 mL Nebulization Once  . levothyroxine  75 mcg Oral QAC breakfast  . metoprolol succinate  50 mg Oral BID  . patiromer  16.8 g Oral Daily  . QUEtiapine  100 mg Oral QHS  . topiramate  50 mg Oral QPM   Continuous Infusions: . sodium chloride Stopped (03/06/20 2315)  . lactated ringers 100 mL/hr at 03/07/20 1043     LOS: 1 day     Enzo Bi, MD [facility] If 7PM-7AM, please contact night-coverage 03/07/2020, 6:03 PM

## 2020-03-07 NOTE — Evaluation (Signed)
Physical Therapy Evaluation Patient Details Name: [name] MRN: [id number] DOB: 03/15/1946 Today's Date: 03/07/2020   History of Present Illness  Pt admitted for AKI with complaints of falls and AMS. History includes CAD, depression, Chronic pain, DM, HTN, and HLD. Initially confused, however appears back to baseline at this time.  Clinical Impression  Pt is a pleasant 74 year old female who was admitted for AKI. Alert and oriented at this time. Pt performs bed mobility with min assist, transfers with supervision, and ambulation with cga with RW. Pt demonstrates all bed mobility/transfers/ambulation at baseline level. Pt does not require any further PT needs at this time. Pt will be dc in house and does not require follow up. RN aware. Will dc current orders.     Follow Up Recommendations No PT follow up    Equipment Recommendations  None recommended by PT    Recommendations for Other Services       Precautions / Restrictions Precautions Precautions: Fall Restrictions Weight Bearing Restrictions: No      Mobility  Bed Mobility Overal bed mobility: Needs Assistance Bed Mobility: Supine to Sit     Supine to sit: Min assist     General bed mobility comments: assist needed for trunkal posture. Once seated, upright posture noted  Transfers Overall transfer level: Needs assistance Equipment used: Rolling walker (2 wheeled) Transfers: Sit to/from Stand Sit to Stand: Supervision         General transfer comment: Safe technique with no dizziness while standing  Ambulation/Gait Ambulation/Gait assistance: Min guard Gait Distance (Feet): 80 Feet Assistive device: Rolling walker (2 wheeled) Gait Pattern/deviations: Step-through pattern     General Gait Details: ambulated laps in room with safe technique. Reciprocal gait pattern performed with no SOB symptoms. All mobility performed on RA with sats WNL. Able to perform turns safely. Prefers not to ambulate in hallway  as she doesn't want to wear a mask  Stairs            Wheelchair Mobility    Modified Rankin (Stroke Patients Only)       Balance Overall balance assessment: History of Falls;Mild deficits observed, not formally tested                                           Pertinent Vitals/Pain Pain Assessment: No/denies pain    Home Living Family/patient expects to be discharged to:: Private residence Living Arrangements: Non-relatives/Friends(roomate) Available Help at Discharge: Available 24 hours/day Type of Home: Apartment Home Access: Ramped entrance     Home Layout: One level Home Equipment: Walker - 4 wheels;Cane - single point      Prior Function Level of Independence: Independent with assistive device(s)         Comments: reports she primarily uses [facility] for household ambulation, however uses rollater if needed. Reports 2 falls in past 6 months.     Hand Dominance        Extremity/Trunk Assessment   Upper Extremity Assessment Upper Extremity Assessment: Overall WFL for tasks assessed    Lower Extremity Assessment Lower Extremity Assessment: Overall WFL for tasks assessed       Communication   Communication: No difficulties  Cognition Arousal/Alertness: Awake/alert Behavior During Therapy: WFL for tasks assessed/performed Overall Cognitive Status: Within Functional Limits for tasks assessed  General Comments: she is generally annoyed with her care, trying to leave AMA and wants to rest      General Comments      Exercises     Assessment/Plan    PT Assessment Patent does not need any further PT services  PT Problem List         PT Treatment Interventions      PT Goals (Current goals can be found in the Care Plan section)  Acute Rehab PT Goals Patient Stated Goal: to go home PT Goal Formulation: All assessment and education complete, DC therapy Time For Goal Achievement:  03/07/20 Potential to Achieve Goals: Good    Frequency     Barriers to discharge        Co-evaluation               AM-PAC PT "6 Clicks" Mobility  Outcome Measure Help needed turning from your back to your side while in a flat bed without using bedrails?: None Help needed moving from lying on your back to sitting on the side of a flat bed without using bedrails?: None Help needed moving to and from a bed to a chair (including a wheelchair)?: None Help needed standing up from a chair using your arms (e.g., wheelchair or bedside chair)?: None Help needed to walk in hospital room?: None Help needed climbing 3-5 steps with a railing? : None 6 Click Score: 24    End of Session Equipment Utilized During Treatment: Gait belt Activity Tolerance: Patient tolerated treatment well Patient left: in chair;with chair alarm set Nurse Communication: Mobility status PT Visit Diagnosis: History of falling (Z91.81)    Time: [id number] PT Time Calculation (min) (ACUTE ONLY): 31 min   Charges:   PT Evaluation $PT Eval Low Complexity: 1 Low PT Treatments $Gait Training: 8-22 mins        Greggory Stallion, PT, DPT [phone]   [name] 03/07/2020, 2:28 PM

## 2020-03-07 NOTE — Progress Notes (Signed)
Confirmed that MD does not want patient on cardiac monitoring- confirmed.

## 2020-03-07 NOTE — Progress Notes (Signed)
   03/07/20 0730  Assess: MEWS Score  Temp 98.1 F (36.7 C)  BP 138/90  Pulse Rate (!) 111  ECG Heart Rate (!) 114  Resp 14  Level of Consciousness Alert  SpO2 100 %  O2 Device Nasal Cannula  O2 Flow Rate (L/min) 2 L/min  Assess: MEWS Score  MEWS Temp 0  MEWS Systolic 0  MEWS Pulse 2  MEWS RR 0  MEWS LOC 0  MEWS Score 2  MEWS Score Color Yellow  Assess: if the MEWS score is Yellow or Red  Were vital signs taken at a resting state? Yes  Focused Assessment Documented focused assessment  Early Detection of Sepsis Score *See Row Information* Low  MEWS guidelines implemented *See Row Information* Yes  Treat  MEWS Interventions Administered scheduled meds/treatments  Take Vital Signs  Increase Vital Sign Frequency  Yellow: Q 2hr X 2 then Q 4hr X 2, if remains yellow, continue Q 4hrs  Escalate  MEWS: Escalate Yellow: discuss with charge nurse/RN and consider discussing with provider and RRT  Notify: Charge Nurse/RN  Name of Charge Nurse/RN Notified Debi, rn  Date Charge Nurse/RN Notified 03/07/20  Time Charge Nurse/RN Notified 0730  Notify: Provider  Provider Name/Title Dr. Earleen Newport  Date Provider Notified 03/07/20  Time Provider Notified 0730  Notification Type Page  Response No new orders  Date of Provider Response 03/07/20  Time of Provider Response 0800  Document  Patient Outcome Other (Comment) (stable, no changes, known condition)  Progress note created (see row info) Yes

## 2020-03-07 NOTE — TOC Initial Note (Signed)
Transition of Care [facility]) - Initial/Assessment Note    Patient Details  Name: [name] MRN: [id number] Date of Birth: 03-16-1946  Transition [facility] CM/SW Contact:    Shelbie Hutching, RN Phone Number: 03/07/2020, 10:57 AM  Clinical Narrative:                 RNCM spoke with the patient and her daughter, [name], at the bedside, introduced self and explained role.  Patient is a little confused this morning and anxious.  Patient is crying and wanting to go home.  Patient tells daughter that she has been here for 4 days even though it has just been 1.  RNCM reoriented patient and explained why she needed to stay in the hospital.   Patient is reluctant and remains tearful.  MD notified that patient wants to be discharged. Patient is from home where she has a house mate.  Her house mate takes care of the cleaning and helps her get to her car.  Patient has a walker and bedside commode at home.  Patient refuses home health services "states my house mate does not want anyone coming in the house".  Patient is current with her PCP. RNCM will cont to follow and assist with discharge as needed.   Expected Discharge Plan: Home/Self Care Barriers to Discharge: Continued Medical Work up   Patient Goals and CMS Choice Patient states their goals for this hospitalization and ongoing recovery are:: patient wants to go home today   Choice offered to / list presented to : NA  Expected Discharge Plan and Services Expected Discharge Plan: Home/Self Care   Discharge Planning Services: CM Consult   Living arrangements for the past 2 months: Single Family Home                           HH Arranged: Refused HH          Prior Living Arrangements/Services Living arrangements for the past 2 months: Single Family Home Lives with:: Roommate Patient language and need for interpreter reviewed:: Yes Do you feel safe going back to the place where you live?: Yes      Need for Family Participation  in Patient Care: Yes (Comment)(UTI) Care giver support system in place?: Yes (comment)(daughter and house mate) Current home services: DME(rollator and 3 in1) Criminal Activity/Legal Involvement Pertinent to Current Situation/Hospitalization: No - Comment as needed  Activities of Daily Living Home Assistive Devices/Equipment: Cane (specify quad or straight) ADL Screening (condition at time of admission) Patient's cognitive ability adequate to safely complete daily activities?: Yes Is the patient deaf or have difficulty hearing?: No Does the patient have difficulty seeing, even when wearing glasses/contacts?: No Does the patient have difficulty concentrating, remembering, or making decisions?: No Patient able to express need for assistance with ADLs?: Yes Does the patient have difficulty dressing or bathing?: No Independently performs ADLs?: No Communication: Independent Dressing (OT): Needs assistance Is this a change from baseline?: Pre-admission baseline Grooming: Independent Feeding: Independent Bathing: Needs assistance Is this a change from baseline?: Pre-admission baseline Toileting: Independent In/Out Bed: Independent Walks in Home: Needs assistance Is this a change from baseline?: Pre-admission baseline Does the patient have difficulty walking or climbing stairs?: Yes Weakness of Legs: Both Weakness of Arms/Hands: Both  Permission Sought/Granted Permission sought to share information with : Case Manager, Family Supports Permission granted to share information with : Yes, Verbal Permission Granted  Permission granted to share info w Relationship: daughter     Emotional Assessment Appearance:: Appears stated age Attitude/Demeanor/Rapport: Crying, Other (comment) Affect (typically observed): Agitated, Anxious Orientation: : Oriented to Self, Oriented to Place, Oriented to Situation Alcohol / Substance Use: Not Applicable Psych Involvement: No  (comment)  Admission diagnosis:  Acute cystitis without hematuria [N30.00] AKI (acute kidney injury) ([place]) [N17.9] Altered mental status, unspecified altered mental status type [R41.82] Patient Active Problem List   Diagnosis Date Noted  . AKI (acute kidney injury) ([place]) 03/06/2020  . Hyperkalemia 03/06/2020  . Acute metabolic encephalopathy [id number]  . Acute respiratory failure with hypoxia ([place]) 02/06/2020  . OSA (obstructive sleep apnea) 02/06/2020  . CPAP ventilation treatment not tolerated 02/06/2020  . Morbid obesity with BMI of 45.0-49.9, adult ([place]) 02/06/2020  . Lactic acidosis 02/06/2020  . possible Sepsis ([place]) 02/06/2020  . Polypharmacy 02/06/2020  . CAD (coronary artery disease) 05/05/2018  . Microscopic hematuria 09/17/2016  . Cystitis, chronic 09/17/2016  . 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 Closed fracture of metatarsal bone 07/22/2016  . Plantar fasciitis, left 05/28/2016  . Degenerative spondylolisthesis 01/02/2016  . Tachycardia 01/02/2016  . Diabetes mellitus with complication ([place])   . Essential hypertension   . Non-toxic multinodular goiter 10/11/2014  . Diabetes mellitus ([place]) 10/11/2014  . Hyperlipidemia 10/11/2014  . Anxiety and depression 10/11/2014  . Osteoporosis 10/11/2014  . GERD (gastroesophageal reflux disease) 10/11/2014  . Arthritis 10/11/2014  . Cystocele 10/11/2014  . Chronic pain syndrome 10/11/2014  . Amnesia 10/11/2014  . Back pain 10/11/2014   PCP:  Remi Haggard, FNP Pharmacy:   [id number] - [place], [place] - [address] [place] [address] Phone: [phone] Fax: [phone]     Social Determinants of Health (SDOH) Interventions    Readmission Risk Interventions No flowsheet data found.

## 2020-03-07 NOTE — Progress Notes (Signed)
Pharmacy Antibiotic Note  [name] is a 74 y.o. female admitted on 03/06/2020 with UTI.  Pharmacy has been consulted for meropenem dosing.  Renal function improving.  Plan: Increase Meropenem 1g IV q12h   Height: 5\' 4"  (162.6 cm) Weight: 99.8 kg (220 lb)(220 lbs) IBW/kg (Calculated) : 54.7  Temp (24hrs), Avg:98.2 F (36.8 C), Min:98 F (36.7 C), Max:98.4 F (36.9 C)  Recent Labs  Lab 03/06/20 0725 03/07/20 0434  WBC 8.0 7.2  CREATININE 3.42* 1.91*  LATICACIDVEN 1.7  --     Estimated Creatinine Clearance: 30.6 mL/min (A) (by C-G formula based on SCr of 1.91 mg/dL (H)).    No Known Allergies  Antimicrobials this admission: Ceftriaxone 5/3 x1 Meropenem 5/3 >>  Dose adjustments this admission: 5/4 Meropenem 500mg  IV q12h increased to 1g IV q12h  Microbiology results: 5/3 UCx: pending   Thank you for allowing pharmacy to be a part of this patient's care.  Paulina Fusi, PharmD, BCPS 03/07/2020 9:44 AM

## 2020-03-07 NOTE — Progress Notes (Signed)
Alerted Dr. Billie Ruddy that patient's HR is 120-125 sustaining. Other vitals are stable. Patient is asymptomatic. MD acknowledges and ordered a dose of PO metoprolol. Given, HR came back to WNL. All safety measures in place. Will monitor patient closely. Frequent vital sings imitated.

## 2020-03-08 DIAGNOSIS — Z79899 Other long term (current) drug therapy: Secondary | ICD-10-CM

## 2020-03-08 LAB — CBC
HCT: 34.4 % — ABNORMAL LOW (ref 36.0–46.0)
Hemoglobin: 11.2 g/dL — ABNORMAL LOW (ref 12.0–15.0)
MCH: 29 pg (ref 26.0–34.0)
MCHC: 32.6 g/dL (ref 30.0–36.0)
MCV: 89.1 fL (ref 80.0–100.0)
Platelets: 148 10*3/uL — ABNORMAL LOW (ref 150–400)
RBC: 3.86 MIL/uL — ABNORMAL LOW (ref 3.87–5.11)
RDW: 15.1 % (ref 11.5–15.5)
WBC: 6.5 10*3/uL (ref 4.0–10.5)
nRBC: 0 % (ref 0.0–0.2)

## 2020-03-08 LAB — BLOOD GAS, VENOUS
Acid-base deficit: 3.5 mmol/L — ABNORMAL HIGH (ref 0.0–2.0)
Bicarbonate: 25.1 mmol/L (ref 20.0–28.0)
O2 Saturation: 26.2 %
Patient temperature: 37
pCO2, Ven: 60 mmHg (ref 44.0–60.0)
pH, Ven: 7.23 — ABNORMAL LOW (ref 7.250–7.430)

## 2020-03-08 LAB — BASIC METABOLIC PANEL
Anion gap: 6 (ref 5–15)
BUN: 30 mg/dL — ABNORMAL HIGH (ref 8–23)
CO2: 24 mmol/L (ref 22–32)
Calcium: 10.1 mg/dL (ref 8.9–10.3)
Chloride: 106 mmol/L (ref 98–111)
Creatinine, Ser: 1.04 mg/dL — ABNORMAL HIGH (ref 0.44–1.00)
GFR calc Af Amer: >60 mL/min (ref >60)
GFR calc non Af Amer: 54 mL/min — ABNORMAL LOW (ref >60)
Glucose, Bld: 235 mg/dL — ABNORMAL HIGH (ref 70–99)
Potassium: 4.8 mmol/L (ref 3.5–5.1)
Sodium: 136 mmol/L (ref 135–145)

## 2020-03-08 LAB — URINE CULTURE: Culture: >=100000 — AB

## 2020-03-08 LAB — GLUCOSE, CAPILLARY
Glucose-Capillary: 215 mg/dL — ABNORMAL HIGH (ref 70–99)
Glucose-Capillary: 201 mg/dL — ABNORMAL HIGH (ref 70–99)

## 2020-03-08 LAB — MAGNESIUM: Magnesium: 1.4 mg/dL — ABNORMAL LOW (ref 1.7–2.4)

## 2020-03-08 MED ORDER — FOSFOMYCIN TROMETHAMINE 3 G PO PACK
3.0000 g | PACK | Freq: Once | ORAL | Status: AC
  Administered 2020-03-08: 3 g via ORAL
  Filled 2020-03-08 (×3): qty 3

## 2020-03-08 MED ORDER — OXYCODONE HCL 10 MG PO TABS: 10.0000 mg | ORAL_TABLET | Freq: Two times a day (BID) | ORAL | 0 refills | Status: DC | PRN

## 2020-03-08 MED ORDER — SODIUM CHLORIDE 0.9 % IV SOLN
1.0000 g | Freq: Three times a day (TID) | INTRAVENOUS | Status: DC
  Filled 2020-03-08 (×3): qty 1

## 2020-03-08 NOTE — Progress Notes (Signed)
Pharmacy Antibiotic Note  [name] is a 74 y.o. female admitted on 03/06/2020 with UTI/ESBL E.coli  Pharmacy has been consulted for meropenem dosing.  Renal function improving.  Plan: -Meropenem 1g IV q8h for ESBL E.coli UTI   Height: 5\' 4"  (162.6 cm) Weight: 99.8 kg (220 lb)(220 lbs) IBW/kg (Calculated) : 54.7  Temp (24hrs), Avg:98.2 F (36.8 C), Min:97.8 F (36.6 C), Max:98.5 F (36.9 C)  Recent Labs  Lab 03/06/20 0725 03/07/20 0434 03/08/20 0403  WBC 8.0 7.2 6.5  CREATININE 3.42* 1.91* 1.04*  LATICACIDVEN 1.7  --   --     Estimated Creatinine Clearance: 56.1 mL/min (A) (by C-G formula based on SCr of 1.04 mg/dL (H)).    No Known Allergies  Antimicrobials this admission: Ceftriaxone 5/3 x1 Meropenem 5/3 >>5/4, resume 5/5 >>  Dose adjustments this admission: 5/4 Meropenem 500mg  IV q12h increased to 1g IV q12h 5/5 Meropenem increased to 1 gm q8h  Microbiology results: 5/3 UCx: ESBL E.coli  Thank you for allowing pharmacy to be a part of this patient's care.  Chinita Greenland PharmD Clinical Pharmacist 03/08/2020

## 2020-03-08 NOTE — TOC Transition Note (Signed)
Transition of Care [facility]) - CM/SW Discharge Note   Patient Details  Name: [name] MRN: [id number] Date of Birth: [birth year]  Transition [facility] CM/SW Contact:  Shelbie Hutching, RN Phone Number: 03/08/2020, 11:33 AM   Clinical Narrative:    Patient discharging home, daughter is providing transportation.  Patient has no discharge needs.    Final next level of care: Home/Self Care Barriers to Discharge: Barriers Resolved   Patient Goals and CMS Choice Patient states their goals for this hospitalization and ongoing recovery are:: patient wants to go home today   Choice offered to / list presented to : NA  Discharge Placement                       Discharge Plan and Services   Discharge Planning Services: CM Consult                      HH Arranged: Refused [facility]          Social Determinants of Health (SDOH) Interventions     Readmission Risk Interventions Readmission Risk Prevention Plan 03/07/2020  Transportation Screening Complete  PCP or Specialist Appt within 3-5 Days Complete  HRI or [place] Complete  Social Work Consult for [place] Planning/Counseling Complete  Palliative Care Screening Not Applicable  Medication Review Press photographer) Complete  Some recent data might be hidden

## 2020-03-08 NOTE — Discharge Summary (Signed)
Physician Discharge Summary  [name] [id number] DOB: [birth year] DOA: 03/06/1971  PCP: Remi Haggard, FNP  Admit date: 03/06/2020 Discharge date: 03/08/2020  Admitted From: home Disposition:  hom  Recommendations for Outpatient Follow-up:  1. Follow up with PCP in 1-2 weeks 2. Please obtain BMP/CBC in one week 3. Please follow up on patient's multiple sedating medications.  She has had two presentations for altered mental status in the past month.  I have advised she reduce frequency of oxycodone.  Please consider further reducing polypharmacy in this patient.  Home Health: No, none recommended  Equipment/Devices: None   Discharge Condition: Stable  CODE STATUS: DNR  Diet recommendation: Carb Modified    Discharge Diagnoses: Principal Problem:   AKI (acute kidney injury) ([place]) Active Problems:   Diabetes mellitus ([place])   Anxiety and depression   GERD (gastroesophageal reflux disease)   Acute metabolic encephalopathy   Acute respiratory failure with hypoxia (HCC)   Polypharmacy   Hyperkalemia    Summary of HPI and Hospital Course:  [name] a 74 y.o.Caucasian femalewith medical history significantforCAD with history of LAD stent 2019, anxiety, depression and chronic pain on several psychotropic agents, OSA intolerant of CPAP, history of diabetes, hypothyroidism, hypertension, HLD who was brought into the emergency roomfor evaluation of altered mental status and frequent falls. Patient was found at the base of her recliner and noted to have a contusion to the back of herhead. She was found to be hypotensive with systolic blood pressure in the 90supon arrival to the ER and responded to IV fluid resuscitation in the emergency room and she was also hypoxic on room air with pulse oximetry in the 80s requiring oxygen supplementation at 2 L.   AMS likely 2/2 polypharmacy - improved Chronic pain on chronic opioids - stable Anxiety and depression -  stable Patient presented for evaluation of altered mental status and responded to Narcan in the emergency room.  CT head was negative.  Multiple psychotropic agents on home med lists, however, pt and daughter said pt no longer takes buprenorphine, klonopin, and takes oxycodone 10 mg only once per day.  Although pt also said her house mate handles her her medications, so pt isn't sure what she was taking exactly. --Pt had a hospitalization from 4/3 to 02/07/20 for similar presentation of AMS secondary to new narcotic use and buprenorphine was d/c'ed at that time. PLAN: --continue home Buspar, Cumbalta, gabapentin, topamax --resume home Seroquel at 100 mg nightly --Hold home oxycodone --d/c home buprenorphine, Klonopin and Ambien  Acute kidney injury  On presentation, creatinine of3.42 above her baseline of 0.90.  Could be due to hypotension and dehydration.  Improved with IVF's.  Hyperkalemia Mild with no EKG changes.  Treated with Veltassa in ED.  Resolved.  Urinary tract infection Patient with significant pyuria and bacteruria.  Complained of mild burning with urination which is present most of the time.  No fever, leukocytosis.  Prior urine culture in 2018 yielded ESBL E. Coli, so pt was started on meropenem on admission, however, after 3 doses, still complained of burning with urination.  Query whether her urinary symptom is really due to UTI.  Other possibility include atrophic vaginitis. Treated with Rocephin, course completed, clinically improved.    Acute respiratory failure with hypoxia (HCC) --likely due to respiratory depression from her sedating medications.    Diabetes mellitus with complication (HCC) Continue Lantus 75u nightly Continue mealtime 6u TID SSI TID, no bed-time coverage unless pt is eating at bed-time.  Essential hypertension Sinus tachycardia --resume home metoprolol  --Held lisinopril and lasix due to AKI.  Resumed on admission with AKI  resolved.  CAD (coronary artery disease) History of DES to proximal LAD in 2019. Continue aspirin and statin  OSA (obstructive sleep apnea) CPAP mask not tolerated, does not use.  Morbid obesity with BMI of 45.0-49.9, adult ([place]) -This complicates overall prognosis and care  Hypothyroidism Continue Synthroid  Chronic diastolic dysfunction CHF --resume home metoprolol, Lasix  Discharge Instructions   Discharge Instructions    Call MD for:  extreme fatigue   Complete by: As directed    Call MD for:  persistant dizziness or light-headedness   Complete by: As directed    Call MD for:  persistant nausea and vomiting   Complete by: As directed    Call MD for:  temperature >100.4   Complete by: As directed    Diet - low sodium heart healthy   Complete by: As directed    Discharge instructions   Complete by: As directed    Please see your primary care doctor within 1 week and have repeat labs to check kidney function.   Increase activity slowly   Complete by: As directed      Allergies as of 03/08/2020   No Known Allergies     Medication List    STOP taking these medications   Belbuca 300 MCG Film Generic drug: Buprenorphine HCl   doxepin 10 MG capsule Commonly known as: SINEQUAN   zolpidem 10 MG tablet Commonly known as: AMBIEN     TAKE these medications   albuterol 108 (90 Base) MCG/ACT inhaler Commonly known as: VENTOLIN HFA Inhale 2 puffs into the lungs every 4 (four) hours as needed for shortness of breath or wheezing.   aspirin EC 81 MG tablet Take 81 mg by mouth daily.   atorvastatin 40 MG tablet Commonly known as: LIPITOR Take 40 mg by mouth at bedtime.   busPIRone 30 MG tablet Commonly known as: BUSPAR Take 30 mg by mouth 2 (two) times daily.   clonazePAM 0.5 MG tablet Commonly known as: KLONOPIN Take 1 tablet (0.5 mg total) by mouth daily.   cyclobenzaprine 5 MG tablet Commonly known as: FLEXERIL Take 1 tablet (5 mg total) by  mouth daily.   DULoxetine 60 MG capsule Commonly known as: CYMBALTA Take 60 mg by mouth daily.   fluticasone 50 MCG/ACT nasal spray Commonly known as: FLONASE Place 2 sprays into both nostrils daily as needed for allergies or rhinitis.   furosemide 20 MG tablet Commonly known as: LASIX Take 20 mg by mouth 2 (two) times daily as needed for fluid or edema.   gabapentin 300 MG capsule Commonly known as: NEURONTIN Take 1 capsule (300 mg total) by mouth 3 (three) times daily.   Lantus SoloStar 100 UNIT/ML Solostar Pen Generic drug: insulin glargine Inject 75 Units into the skin at bedtime.   levothyroxine 75 MCG tablet Commonly known as: SYNTHROID Take 1 tablet (75 mcg total) by mouth daily before breakfast.   lisinopril 40 MG tablet Commonly known as: ZESTRIL Take 1 tablet (40 mg total) by mouth daily.   metFORMIN 500 MG 24 hr tablet Commonly known as: GLUCOPHAGE-XR Take 1,000 mg by mouth 2 (two) times daily.   metoprolol succinate 50 MG 24 hr tablet Commonly known as: TOPROL-XL Take 50 mg by mouth 2 (two) times daily.   nystatin powder Commonly known as: MYCOSTATIN/NYSTOP Apply 1 application topically 2 (two) times daily as needed Bjorn Pippin  areas in skin folds).   ondansetron 4 MG tablet Commonly known as: ZOFRAN Take 4 mg by mouth every 8 (eight) hours as needed for nausea/vomiting.   Oxycodone HCl 10 MG Tabs Take 1 tablet (10 mg total) by mouth 2 (two) times daily as needed. What changed:   when to take this  reasons to take this   QUEtiapine 200 MG tablet Commonly known as: SEROQUEL Take 100-200 mg by mouth at bedtime.   topiramate 50 MG tablet Commonly known as: TOPAMAX Take 1 tablet (50 mg total) by mouth daily.   Vitamin D3 25 MCG (1000 UT) Caps Take 1,000 Units by mouth daily.       No Known Allergies  Consultations:  none   Procedures/Studies: CT Head Wo Contrast  Result Date: 03/06/2020 CLINICAL DATA:  Head trauma, headache. Additional  history provided: Found down. EXAM: CT HEAD WITHOUT CONTRAST CT CERVICAL SPINE WITHOUT CONTRAST TECHNIQUE: Multidetector CT imaging of the head and cervical spine was performed following the standard protocol without intravenous contrast. Multiplanar CT image reconstructions of the cervical spine were also generated. COMPARISON:  Head CT 02/05/2020, cervical spine radiographs 10/25/2008, report from cervical spine CT 07/11/2008 (images unavailable). FINDINGS: CT HEAD FINDINGS Brain: Stable, mild generalized parenchymal atrophy. There is no acute intracranial hemorrhage. No demarcated cortical infarct. No extra-axial fluid collection. No evidence of intracranial mass. No midline shift. Vascular: No hyperdense vessel. Skull: Normal. Negative for fracture or focal lesion. Sinuses/Orbits: Visualized orbits show no acute finding. No significant paranasal sinus disease or mastoid effusion at the imaged levels. CT CERVICAL SPINE FINDINGS Evaluation limited by mild to moderate motion degradation, most notably at the C4 level. Alignment: No significant spondylolisthesis. Skull base and vertebrae: The basion-dental and atlanto-dental intervals are maintained.No evidence of acute fracture to the cervical spine. Soft tissues and spinal canal: No prevertebral fluid or swelling. No visible canal hematoma. Disc levels: Cervical spondylosis without high-grade bony spinal canal narrowing. Upper chest: No consolidation within the imaged lung apices. No visible pneumothorax. IMPRESSION: CT head: 1. No evidence of acute intracranial abnormality. 2. Stable, mild generalized parenchymal atrophy. CT cervical spine: 1. Evaluation limited by mild to moderate motion degradation. 2. No acute cervical spine fracture is identified. Electronically Signed   By: Kellie Simmering DO   On: 03/06/2020 08:04   CT Cervical Spine Wo Contrast  Result Date: 03/06/2020 CLINICAL DATA:  Head trauma, headache. Additional history provided: Found down. EXAM: CT  HEAD WITHOUT CONTRAST CT CERVICAL SPINE WITHOUT CONTRAST TECHNIQUE: Multidetector CT imaging of the head and cervical spine was performed following the standard protocol without intravenous contrast. Multiplanar CT image reconstructions of the cervical spine were also generated. COMPARISON:  Head CT 02/05/2020, cervical spine radiographs 10/25/2008, report from cervical spine CT 07/11/2008 (images unavailable). FINDINGS: CT HEAD FINDINGS Brain: Stable, mild generalized parenchymal atrophy. There is no acute intracranial hemorrhage. No demarcated cortical infarct. No extra-axial fluid collection. No evidence of intracranial mass. No midline shift. Vascular: No hyperdense vessel. Skull: Normal. Negative for fracture or focal lesion. Sinuses/Orbits: Visualized orbits show no acute finding. No significant paranasal sinus disease or mastoid effusion at the imaged levels. CT CERVICAL SPINE FINDINGS Evaluation limited by mild to moderate motion degradation, most notably at the C4 level. Alignment: No significant spondylolisthesis. Skull base and vertebrae: The basion-dental and atlanto-dental intervals are maintained.No evidence of acute fracture to the cervical spine. Soft tissues and spinal canal: No prevertebral fluid or swelling. No visible canal hematoma. Disc levels: Cervical spondylosis without high-grade bony  spinal canal narrowing. Upper chest: No consolidation within the imaged lung apices. No visible pneumothorax. IMPRESSION: CT head: 1. No evidence of acute intracranial abnormality. 2. Stable, mild generalized parenchymal atrophy. CT cervical spine: 1. Evaluation limited by mild to moderate motion degradation. 2. No acute cervical spine fracture is identified. Electronically Signed   By: Kellie Simmering DO   On: 03/06/2020 08:04   DG Chest Portable 1 View  Result Date: 03/06/2020 CLINICAL DATA:  74 year old female with history of altered mental status. EXAM: PORTABLE CHEST 1 VIEW COMPARISON:  Chest x-ray  02/05/2020. FINDINGS: Lung volumes are low. Chronic elevation of the right hemidiaphragm is unchanged. No definite acute consolidative airspace disease. No pleural effusions. No pneumothorax. No evidence of pulmonary edema. Heart size is normal. Numerous bilateral calcified mediastinal and hilar lymph nodes. Upper mediastinal contours are otherwise within normal limits. Aortic atherosclerosis. IMPRESSION: 1. Low lung volumes without radiographic evidence of acute cardiopulmonary disease. 2. Chronic elevation of the right hemidiaphragm is unchanged. Electronically Signed   By: Vinnie Langton M.D.   On: 03/06/2020 08:02   CT Renal Stone Study  Result Date: 03/06/2020 CLINICAL DATA:  Flank pain.  Lethargy. EXAM: CT ABDOMEN AND PELVIS WITHOUT CONTRAST TECHNIQUE: Multidetector CT imaging of the abdomen and pelvis was performed following the standard protocol without oral or IV contrast. COMPARISON:  Mar 24, 2017 FINDINGS: Lower chest: There is bibasilar atelectatic change. Stable 2 mm nodular opacity in the lateral right base seen on axial slice 1 series 4. Lung bases otherwise clear. There are foci of coronary artery calcification. There is calcification in the left hilar lymph node consistent with prior granulomatous disease. Hepatobiliary: No appreciable liver lesions evident on this noncontrast enhanced study. The gallbladder is absent. Common bile duct measures up to 1.2 cm, dilated. No appreciable intrahepatic biliary duct dilatation. No biliary duct mass or calculus is evident on this study. Pancreas: There is no pancreatic mass or inflammatory focus. Spleen: There are a few tiny cysts iliac granulomas. Spleen otherwise appears normal on this noncontrast enhanced study. Adrenals/Urinary Tract: Adrenals bilaterally appear normal. Kidneys bilaterally demonstrate no evident mass or hydronephrosis on either side. There is no evident renal or ureteral calculus on either side. Urinary bladder is midline with wall  thickness within normal limits. Stomach/Bowel: There are sigmoid diverticula without diverticulitis, similar to prior study. There is no appreciable bowel wall or mesenteric thickening. Terminal ileum appears unremarkable. There is no evident bowel obstruction. There is no free air or portal venous air. Vascular/Lymphatic: There is no abdominal aortic aneurysm. There is aortic atherosclerotic calcification. There is calcification in the proximal left renal artery, also present previously. No evident adenopathy in the abdomen or pelvis. Reproductive: Uterus absent.  No pelvic mass. Other: Appendix absent. No periappendiceal region inflammation. No evident abscess or ascites in the abdomen or pelvis. Musculoskeletal: Postoperative change noted at L4-5. Compression fracture of L1 is stable. No blastic or lytic bone lesions evident. There is degenerative change in the lumbar spine. There is no appreciable intramuscular or abdominal wall lesion. IMPRESSION: 1. No renal or ureteral calculus. No hydronephrosis on either side. Urinary bladder is midline with wall thickness within normal limits. 2. Sigmoid diverticulosis without diverticulitis. No bowel wall thickening or bowel obstruction. No abscess in the abdomen or pelvis. Appendix absent. No periappendiceal region inflammation. 3. Gallbladder absent. Common bile duct dilatation noted without obstructing focus evident by CT. If further assessment is felt to be warranted, MRCP would be the optimum study of choice to further  evaluate the biliary ductal system. 4.  Evidence of prior granulomatous disease. 5. Aortic Atherosclerosis (ICD10-I70.0). Multiple foci of coronary artery calcification noted. 6.  Uterus absent. 7. Stable compression fracture L1. Postoperative change L4-5 noted. Electronically Signed   By: Lowella Grip III M.D.   On: 03/06/2020 09:51       Subjective: Patient seen at bedside.  No acute events.  Reports feeling better. No fevers, chills,  dysuria, SOB or other complaints.   Discharge Exam: Vitals:   03/08/20 0020 03/08/20 0842  BP: 121/62 (!) 171/84  Pulse: 98 95  Resp: 19 15  Temp: 97.8 F (36.6 C) 98.2 F (36.8 C)  SpO2: 90% 92%   Vitals:   03/07/20 1236 03/07/20 1501 03/08/20 0020 03/08/20 0842  BP: 126/75 129/64 121/62 (!) 171/84  Pulse: (!) 104 99 98 95  Resp:  18 19 15   Temp: 98.3 F (36.8 C) 98.4 F (36.9 C) 97.8 F (36.6 C) 98.2 F (36.8 C)  TempSrc: Oral Oral Oral Oral  SpO2: 92% 97% 90% 92%  Weight:      Height:        General: Pt is alert, awake, not in acute distress Cardiovascular: RRR, S1/S2 +, no rubs, no gallops Respiratory: CTA bilaterally, no wheezing, no rhonchi Abdominal: Soft, NT, ND, bowel sounds + Extremities: no edema, no cyanosis    The results of significant diagnostics from this hospitalization (including imaging, microbiology, ancillary and laboratory) are listed below for reference.     Microbiology: Recent Results (from the past 240 hour(s))  Respiratory Panel by RT PCR (Flu A&B, Covid) - Nasopharyngeal Swab     Status: None   Collection Time: 03/06/20  7:25 AM   Specimen: Nasopharyngeal Swab  Result Value Ref Range Status   SARS Coronavirus 2 by RT PCR NEGATIVE NEGATIVE Final    Comment: (NOTE) SARS-CoV-2 target nucleic acids are NOT DETECTED. The SARS-CoV-2 RNA is generally detectable in upper respiratoy specimens during the acute phase of infection. The lowest concentration of SARS-CoV-2 viral copies this assay can detect is 131 copies/mL. A negative result does not preclude SARS-Cov-2 infection and should not be used as the sole basis for treatment or other patient management decisions. A negative result may occur with  improper specimen collection/handling, submission of specimen other than nasopharyngeal swab, presence of viral mutation(s) within the areas targeted by this assay, and inadequate number of viral copies (<131 copies/mL). A negative result must  be combined with clinical observations, patient history, and epidemiological information. The expected result is Negative. Fact Sheet for Patients:  PinkCheek.be Fact Sheet for Healthcare Providers:  GravelBags.it This test is not yet ap proved or cleared by the [place] FDA and  has been authorized for detection and/or diagnosis of SARS-CoV-2 by FDA under an Emergency Use Authorization (EUA). This EUA will remain  in effect (meaning this test can be used) for the duration of the COVID-19 declaration under Section 564(b)(1) of the Act, 21 U.S.C. section 360bbb-3(b)(1), unless the authorization is terminated or revoked sooner.    Influenza A by PCR NEGATIVE NEGATIVE Final   Influenza B by PCR NEGATIVE NEGATIVE Final    Comment: (NOTE) The Xpert Xpress SARS-CoV-2/FLU/RSV assay is intended as an aid in  the diagnosis of influenza from Nasopharyngeal swab specimens and  should not be used as a sole basis for treatment. Nasal washings and  aspirates are unacceptable for Xpert Xpress SARS-CoV-2/FLU/RSV  testing. Fact Sheet for Patients: PinkCheek.be Fact Sheet for Healthcare Providers: GravelBags.it This  test is not yet approved or cleared by the [place] and  has been authorized for detection and/or diagnosis of SARS-CoV-2 by  FDA under an Emergency Use Authorization (EUA). This EUA will remain  in effect (meaning this test can be used) for the duration of the  Covid-19 declaration under Section 564(b)(1) of the Act, 21  U.S.C. section 360bbb-3(b)(1), unless the authorization is  terminated or revoked. Performed at [facility], [address] [place], [place] [address]   Urine Culture     Status: Abnormal   Collection Time: 03/06/20  7:25 AM   Specimen: Urine, Clean Catch  Result Value Ref Range Status   Specimen Description   Final    URINE,  CLEAN CATCH Performed at [facility], [address] [place], [place] [address]    Special Requests   Final    NONE Performed at [facility], [address] [place], [place] [address]    Culture (A)  Final    >=100,000 COLONIES/mL ESCHERICHIA COLI Confirmed Extended Spectrum Beta-Lactamase Producer (ESBL).  In bloodstream infections from ESBL organisms, carbapenems are preferred over piperacillin/tazobactam. They are shown to have a lower risk of mortality.    Report Status 03/08/2020 FINAL  Final   Organism ID, Bacteria ESCHERICHIA COLI (A)  Final      Susceptibility   Escherichia coli - MIC*    AMPICILLIN >=32 RESISTANT Resistant     CEFAZOLIN >=64 RESISTANT Resistant     CEFTRIAXONE >=64 RESISTANT Resistant     CIPROFLOXACIN >=4 RESISTANT Resistant     GENTAMICIN <=1 SENSITIVE Sensitive     IMIPENEM <=0.25 SENSITIVE Sensitive     NITROFURANTOIN 32 SENSITIVE Sensitive     TRIMETH/SULFA <=20 SENSITIVE Sensitive     AMPICILLIN/SULBACTAM >=32 RESISTANT Resistant     PIP/TAZO >=128 RESISTANT Resistant     * >=100,000 COLONIES/mL ESCHERICHIA COLI     Labs: BNP (last 3 results) Recent Labs    03/06/20 0725  BNP [id number]   Basic Metabolic Panel: Recent Labs  Lab 03/06/20 0725 03/07/20 0434 03/08/20 0403  NA 139 140 136  K 5.3* 5.0 4.8  CL 106 108 106  CO2 24 24 24   GLUCOSE 198* 168* 235*  BUN 48* 44* 30*  CREATININE 3.42* 1.91* 1.04*  CALCIUM 8.9 10.1 10.1  MG  --   --  1.4*   Liver Function Tests: Recent Labs  Lab 03/06/20 0725  AST 23  ALT 18  ALKPHOS 61  BILITOT 0.6  PROT 7.1  ALBUMIN 4.1   No results for input(s): LIPASE, AMYLASE in the last 168 hours. No results for input(s): AMMONIA in the last 168 hours. CBC: Recent Labs  Lab 03/06/20 0725 03/07/20 0434 03/08/20 0403  WBC 8.0 7.2 6.5  NEUTROABS 5.2  --   --   HGB 11.0* 11.7* 11.2*  HCT 35.4* 37.5 34.4*  MCV 92.2 91.2 89.1  PLT 159 144* 148*   Cardiac Enzymes: No  results for input(s): CKTOTAL, CKMB, CKMBINDEX, TROPONINI in the last 168 hours. BNP: Invalid input(s): POCBNP CBG: Recent Labs  Lab 03/07/20 0809 03/07/20 1147 03/07/20 1702 03/07/20 2113 03/08/20 0840  GLUCAP 142* 182* 228* 254* 215*   D-Dimer No results for input(s): DDIMER in the last 72 hours. Hgb A1c No results for input(s): HGBA1C in the last 72 hours. Lipid Profile No results for input(s): CHOL, HDL, LDLCALC, TRIG, CHOLHDL, LDLDIRECT in the last 72 hours. Thyroid function studies No results for input(s): TSH, T4TOTAL,  T3FREE, THYROIDAB in the last 72 hours.  Invalid input(s): FREET3 Anemia work up No results for input(s): VITAMINB12, FOLATE, FERRITIN, TIBC, IRON, RETICCTPCT in the last 72 hours. Urinalysis    Component Value Date/Time   COLORURINE YELLOW 03/06/2020 0725   APPEARANCEUR CLOUDY (A) 03/06/2020 0725   APPEARANCEUR Cloudy (A) 07/26/2016 1119   LABSPEC 1.025 03/06/2020 0725   LABSPEC 1.024 01/30/2014 0026   PHURINE 5.5 03/06/2020 0725   GLUCOSEU NEGATIVE 03/06/2020 0725   GLUCOSEU >=500 01/30/2014 0026   HGBUR MODERATE (A) 03/06/2020 0725   BILIRUBINUR NEGATIVE 03/06/2020 0725   BILIRUBINUR Negative 07/26/2016 1119   BILIRUBINUR Negative 01/30/2014 0026   KETONESUR NEGATIVE 03/06/2020 0725   PROTEINUR >300 (A) 03/06/2020 0725   UROBILINOGEN 1.0 07/11/2008 1245   NITRITE NEGATIVE 03/06/2020 0725   LEUKOCYTESUR LARGE (A) 03/06/2020 0725   LEUKOCYTESUR Trace 01/30/2014 0026   Sepsis Labs Invalid input(s): PROCALCITONIN,  WBC,  LACTICIDVEN Microbiology Recent Results (from the past 240 hour(s))  Respiratory Panel by RT PCR (Flu A&B, Covid) - Nasopharyngeal Swab     Status: None   Collection Time: 03/06/20  7:25 AM   Specimen: Nasopharyngeal Swab  Result Value Ref Range Status   SARS Coronavirus 2 by RT PCR NEGATIVE NEGATIVE Final    Comment: (NOTE) SARS-CoV-2 target nucleic acids are NOT DETECTED. The SARS-CoV-2 RNA is generally detectable in  upper respiratoy specimens during the acute phase of infection. The lowest concentration of SARS-CoV-2 viral copies this assay can detect is 131 copies/mL. A negative result does not preclude SARS-Cov-2 infection and should not be used as the sole basis for treatment or other patient management decisions. A negative result may occur with  improper specimen collection/handling, submission of specimen other than nasopharyngeal swab, presence of viral mutation(s) within the areas targeted by this assay, and inadequate number of viral copies (<131 copies/mL). A negative result must be combined with clinical observations, patient history, and epidemiological information. The expected result is Negative. Fact Sheet for Patients:  PinkCheek.be Fact Sheet for Healthcare Providers:  GravelBags.it This test is not yet ap proved or cleared by the [place] FDA and  has been authorized for detection and/or diagnosis of SARS-CoV-2 by FDA under an Emergency Use Authorization (EUA). This EUA will remain  in effect (meaning this test can be used) for the duration of the COVID-19 declaration under Section 564(b)(1) of the Act, 21 U.S.C. section 360bbb-3(b)(1), unless the authorization is terminated or revoked sooner.    Influenza A by PCR NEGATIVE NEGATIVE Final   Influenza B by PCR NEGATIVE NEGATIVE Final    Comment: (NOTE) The Xpert Xpress SARS-CoV-2/FLU/RSV assay is intended as an aid in  the diagnosis of influenza from Nasopharyngeal swab specimens and  should not be used as a sole basis for treatment. Nasal washings and  aspirates are unacceptable for Xpert Xpress SARS-CoV-2/FLU/RSV  testing. Fact Sheet for Patients: PinkCheek.be Fact Sheet for Healthcare Providers: GravelBags.it This test is not yet approved or cleared by the [place] FDA and  has been authorized for  detection and/or diagnosis of SARS-CoV-2 by  FDA under an Emergency Use Authorization (EUA). This EUA will remain  in effect (meaning this test can be used) for the duration of the  Covid-19 declaration under Section 564(b)(1) of the Act, 21  U.S.C. section 360bbb-3(b)(1), unless the authorization is  terminated or revoked. Performed at [facility], [address], [place], [place] [address]   Urine Culture     Status: Abnormal  Collection Time: 03/06/20  7:25 AM   Specimen: Urine, Clean Catch  Result Value Ref Range Status   Specimen Description   Final    URINE, CLEAN CATCH Performed at [facility], [address] [place], [place] [address]    Special Requests   Final    NONE Performed at [facility], [address] [place], [place] [address]    Culture (A)  Final    >=100,000 COLONIES/mL ESCHERICHIA COLI Confirmed Extended Spectrum Beta-Lactamase Producer (ESBL).  In bloodstream infections from ESBL organisms, carbapenems are preferred over piperacillin/tazobactam. They are shown to have a lower risk of mortality.    Report Status 03/08/2020 FINAL  Final   Organism ID, Bacteria ESCHERICHIA COLI (A)  Final      Susceptibility   Escherichia coli - MIC*    AMPICILLIN >=32 RESISTANT Resistant     CEFAZOLIN >=64 RESISTANT Resistant     CEFTRIAXONE >=64 RESISTANT Resistant     CIPROFLOXACIN >=4 RESISTANT Resistant     GENTAMICIN <=1 SENSITIVE Sensitive     IMIPENEM <=0.25 SENSITIVE Sensitive     NITROFURANTOIN 32 SENSITIVE Sensitive     TRIMETH/SULFA <=20 SENSITIVE Sensitive     AMPICILLIN/SULBACTAM >=32 RESISTANT Resistant     PIP/TAZO >=128 RESISTANT Resistant     * >=100,000 COLONIES/mL ESCHERICHIA COLI     Time coordinating discharge: Over 30 minutes  SIGNED:   Ezekiel Slocumb, DO [facility] 03/08/2020, 9:08 AM   If 7PM-7AM, please contact night-coverage [email or web address]

## 2020-03-08 NOTE — Progress Notes (Signed)
IV removed before discharge. Went over discharge instructions with patients. All questions answered and patient stated that she understood. Patient going home POV with daughter.

## 2020-04-22 ENCOUNTER — Emergency Department
Admission: EM | Admit: 2020-04-22 | Discharge: 2020-04-22 | Disposition: A | Discharge status: Home / Self Care | Payer: Medicare Other | Attending: Emergency Medicine | Admitting: Emergency Medicine

## 2020-04-22 ENCOUNTER — Other Ambulatory Visit: Payer: Self-pay

## 2020-04-22 DIAGNOSIS — E119 Type 2 diabetes mellitus without complications: Secondary | ICD-10-CM | POA: Insufficient documentation

## 2020-04-22 DIAGNOSIS — I509 Heart failure, unspecified: Secondary | ICD-10-CM | POA: Insufficient documentation

## 2020-04-22 DIAGNOSIS — I110 Hypertensive heart disease with heart failure: Secondary | ICD-10-CM | POA: Diagnosis not present

## 2020-04-22 DIAGNOSIS — R296 Repeated falls: Secondary | ICD-10-CM | POA: Diagnosis not present

## 2020-04-22 DIAGNOSIS — Z7984 Long term (current) use of oral hypoglycemic drugs: Secondary | ICD-10-CM | POA: Diagnosis not present

## 2020-04-22 DIAGNOSIS — S3992XA Unspecified injury of lower back, initial encounter: Secondary | ICD-10-CM | POA: Diagnosis not present

## 2020-04-22 DIAGNOSIS — M545 Low back pain, unspecified: Secondary | ICD-10-CM

## 2020-04-22 DIAGNOSIS — S8002XA Contusion of left knee, initial encounter: Secondary | ICD-10-CM | POA: Diagnosis not present

## 2020-04-22 DIAGNOSIS — Y9389 Activity, other specified: Secondary | ICD-10-CM | POA: Diagnosis not present

## 2020-04-22 DIAGNOSIS — Z79899 Other long term (current) drug therapy: Secondary | ICD-10-CM | POA: Diagnosis not present

## 2020-04-22 DIAGNOSIS — W1839XA Other fall on same level, initial encounter: Secondary | ICD-10-CM | POA: Diagnosis not present

## 2020-04-22 DIAGNOSIS — Y929 Unspecified place or not applicable: Secondary | ICD-10-CM | POA: Diagnosis not present

## 2020-04-22 DIAGNOSIS — Y999 Unspecified external cause status: Secondary | ICD-10-CM | POA: Diagnosis not present

## 2020-04-22 DIAGNOSIS — M859 Disorder of bone density and structure, unspecified: Secondary | ICD-10-CM | POA: Diagnosis not present

## 2020-04-22 DIAGNOSIS — W19XXXA Unspecified fall, initial encounter: Secondary | ICD-10-CM

## 2020-04-22 DIAGNOSIS — S8992XA Unspecified injury of left lower leg, initial encounter: Secondary | ICD-10-CM | POA: Diagnosis present

## 2020-04-22 LAB — BASIC METABOLIC PANEL
Anion gap: 11 (ref 5–15)
BUN: 21 mg/dL (ref 8–23)
CO2: 27 mmol/L (ref 22–32)
Calcium: 9.3 mg/dL (ref 8.9–10.3)
Chloride: 99 mmol/L (ref 98–111)
Creatinine, Ser: 0.9 mg/dL (ref 0.44–1.00)
GFR calc Af Amer: >60 mL/min (ref >60)
GFR calc non Af Amer: >60 mL/min (ref >60)
Glucose, Bld: 253 mg/dL — ABNORMAL HIGH (ref 70–99)
Potassium: 4.6 mmol/L (ref 3.5–5.1)
Sodium: 137 mmol/L (ref 135–145)

## 2020-04-22 LAB — CBC
HCT: 36.9 % (ref 36.0–46.0)
Hemoglobin: 11.6 g/dL — ABNORMAL LOW (ref 12.0–15.0)
MCH: 28 pg (ref 26.0–34.0)
MCHC: 31.4 g/dL (ref 30.0–36.0)
MCV: 88.9 fL (ref 80.0–100.0)
Platelets: 141 10*3/uL — ABNORMAL LOW (ref 150–400)
RBC: 4.15 MIL/uL (ref 3.87–5.11)
RDW: 14.5 % (ref 11.5–15.5)
WBC: 5.9 10*3/uL (ref 4.0–10.5)
nRBC: 0 % (ref 0.0–0.2)

## 2020-04-22 NOTE — ED Triage Notes (Signed)
Pt comes EMS from home with falls and increased weakness over the past couple of days. Pt is diabetic and CBG was 257. Pt passed EMS stroke screen. Pt Hit head this morning on side of bed with no LOC and is not on thinners. Oriented to self and situation but not year. C/o lower back and right side pain from falls.

## 2020-04-22 NOTE — ED Provider Notes (Signed)
No vertebral   [facility] Emergency Department Provider Note   ____________________________________________    I have reviewed the triage vital signs and the nursing notes.   HISTORY  Chief Complaint Weakness and Fall     HPI [name] is a 74 y.o. female with extensive past medical history as detailed below who presents after a fall.  Patient reports she frequently falls but this is not atypical for her.  She thinks that she lost her balance which is usual for her.  She complains of pain in her left knee and pain in her lower back.  No head injury.  Denies blood thinners.  No abdominal pain, no fevers or chills.  Has not take anything for this  Past Medical History:  Diagnosis Date  . Amnesia   . Anxiety   . Arthritis   . Back pain   . CHF (congestive heart failure) ([place])   . Chronic pain syndrome   . Cystocele   . Depression   . Diabetes mellitus without complication ([place])   . GERD (gastroesophageal reflux disease)   . Hyperlipidemia   . Hypertension   . IBS (irritable bowel syndrome)   . Myocardial infarction ([place])   . Obesity   . Osteoporosis   . Osteoporosis   . Shortness of breath dyspnea   . Sleep apnea    not using now  . Thyroid nodule     Patient Active Problem List   Diagnosis Date Noted  . AKI (acute kidney injury) ([place]) 03/06/2020  . Hyperkalemia 03/06/2020  . Acute metabolic encephalopathy 58/07/9832  . Acute respiratory failure with hypoxia ([place]) 02/06/2020  . OSA (obstructive sleep apnea) 02/06/2020  . CPAP ventilation treatment not tolerated 02/06/2020  . Morbid obesity with BMI of 45.0-49.9, adult ([place]) 02/06/2020  . Lactic acidosis 02/06/2020  . possible Sepsis ([place]) 02/06/2020  . Polypharmacy 02/06/2020  . CAD (coronary artery disease) 05/05/2018  . Microscopic hematuria 09/17/2016  . Cystitis, chronic 09/17/2016  . 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 Closed fracture of metatarsal  bone 07/22/2016  . Plantar fasciitis, left 05/28/2016  . Degenerative spondylolisthesis 01/02/2016  . Tachycardia 01/02/2016  . Diabetes mellitus with complication ([place])   . Essential hypertension   . Non-toxic multinodular goiter 10/11/2014  . Diabetes mellitus ([place]) 10/11/2014  . Hyperlipidemia 10/11/2014  . Anxiety and depression 10/11/2014  . Osteoporosis 10/11/2014  . GERD (gastroesophageal reflux disease) 10/11/2014  . Arthritis 10/11/2014  . Cystocele 10/11/2014  . Chronic pain syndrome 10/11/2014  . Amnesia 10/11/2014  . Back pain 10/11/2014    Past Surgical History:  Procedure Laterality Date  . ABDOMINAL HYSTERECTOMY    . APPENDECTOMY    . CARDIAC CATHETERIZATION    . CHOLECYSTECTOMY    . CORONARY STENT INTERVENTION N/A 05/05/2018   Procedure: CORONARY STENT INTERVENTION;  Surgeon: Isaias Cowman, MD;  Location: [facility];  Service: Cardiovascular;  Laterality: N/A;  . FOOT SURGERY    . LEFT HEART CATH AND CORONARY ANGIOGRAPHY Left 05/05/2018   Procedure: LEFT HEART CATH AND CORONARY ANGIOGRAPHY;  Surgeon: Isaias Cowman, MD;  Location: [facility];  Service: Cardiovascular;  Laterality: Left;  . OOPHORECTOMY    . REPLACEMENT TOTAL KNEE    . TONSILLECTOMY      Prior to Admission medications   Medication Sig Start Date End Date Taking? Authorizing Provider  albuterol (VENTOLIN HFA) 108 (90 Base) MCG/ACT inhaler Inhale 2 puffs into the lungs every 4 (four)  hours as needed for shortness of breath or wheezing. 12/12/19   [provider]  aspirin EC 81 MG tablet Take 81 mg by mouth daily.    [provider]  atorvastatin (LIPITOR) 40 MG tablet Take 40 mg by mouth at bedtime. 12/06/19   [provider]  busPIRone (BUSPAR) 30 MG tablet Take 30 mg by mouth 2 (two) times daily.    [provider]  Cholecalciferol (VITAMIN D3) 1000 units CAPS Take 1,000 Units by mouth daily.     [provider]  clonazePAM  (KLONOPIN) 0.5 MG tablet Take 1 tablet (0.5 mg total) by mouth daily. 02/07/20   Lorella Nimrod, MD  cyclobenzaprine (FLEXERIL) 5 MG tablet Take 1 tablet (5 mg total) by mouth daily. 02/08/20   Lorella Nimrod, MD  DULoxetine (CYMBALTA) 60 MG capsule Take 60 mg by mouth daily.    [provider]  fluticasone (FLONASE) 50 MCG/ACT nasal spray Place 2 sprays into both nostrils daily as needed for allergies or rhinitis.  01/27/20   [provider]  furosemide (LASIX) 20 MG tablet Take 20 mg by mouth 2 (two) times daily as needed for fluid or edema.  01/28/20   [provider]  gabapentin (NEURONTIN) 300 MG capsule Take 1 capsule (300 mg total) by mouth 3 (three) times daily. 02/07/20   Lorella Nimrod, MD  LANTUS SOLOSTAR 100 UNIT/ML Solostar Pen Inject 75 Units into the skin at bedtime.     [provider]  levothyroxine (SYNTHROID) 75 MCG tablet Take 1 tablet (75 mcg total) by mouth daily before breakfast. 02/08/20   Lorella Nimrod, MD  lisinopril (ZESTRIL) 40 MG tablet Take 1 tablet (40 mg total) by mouth daily. 02/08/20   Lorella Nimrod, MD  metFORMIN (GLUCOPHAGE-XR) 500 MG 24 hr tablet Take 1,000 mg by mouth 2 (two) times daily. 01/18/20   [provider]  metoprolol succinate (TOPROL-XL) 50 MG 24 hr tablet Take 50 mg by mouth 2 (two) times daily.     [provider]  nystatin (MYCOSTATIN) powder Apply 1 application topically 2 (two) times daily as needed (rashy areas in skin folds).     [provider]  ondansetron (ZOFRAN) 4 MG tablet Take 4 mg by mouth every 8 (eight) hours as needed for nausea/vomiting. 01/28/20   [provider]  Oxycodone HCl 10 MG TABS Take 1 tablet (10 mg total) by mouth 2 (two) times daily as needed. 03/08/20   Ezekiel Slocumb, DO  QUEtiapine (SEROQUEL) 200 MG tablet Take 100-200 mg by mouth at bedtime. 01/20/20   [provider]  topiramate (TOPAMAX) 50 MG tablet Take 1 tablet (50 mg total) by mouth daily. 02/08/20    Lorella Nimrod, MD     Allergies Patient has no known allergies.  Family History  Problem Relation Age of Onset  . Cancer Mother   . Cancer Father   . Cancer Sister   . Thyroid disease Neg Hx     Social History Social History   Tobacco Use  . Smoking status: Never Smoker  . Smokeless tobacco: Never Used  Vaping Use  . Vaping Use: Never used  Substance Use Topics  . Alcohol use: No  . Drug use: Never    Review of Systems  Constitutional: No fever/chills Eyes: No visual changes.  ENT: No neck pain Cardiovascular: Denies chest pain. Respiratory: Denies shortness of breath. Gastrointestinal: No abdominal pain.   Genitourinary: Negative for dysuria. Musculoskeletal: As above Skin: Negative for rash. Neurological: Negative  for headaches, no new weakness   ____________________________________________   PHYSICAL EXAM:  VITAL SIGNS: ED Triage Vitals  Enc Vitals Group     BP 04/22/20 1134 131/63     Pulse Rate 04/22/20 1134 82     Resp 04/22/20 1134 18     Temp 04/22/20 1134 98.1 F (36.7 C)     Temp Source 04/22/20 1134 Oral     SpO2 04/22/20 1134 92 %     Weight 04/22/20 1135 126.6 kg (279 lb)     Height 04/22/20 1135 1.676 m (5\' 6" )     Head Circumference --      Peak Flow --      Pain Score 04/22/20 1135 8     Pain Loc --      Pain Edu? --      Excl. in [place]? --     Constitutional: Alert and oriented.   Head: Atraumatic. Nose: No congestion/rhinnorhea. Mouth/Throat: Mucous membranes are moist.   Neck:  Painless ROM, Cardiovascular: Normal rate, regular rhythm. Grossly normal heart sounds.  Good peripheral circulation. Respiratory: Normal respiratory effort.  No retractions. Lungs CTAB. Gastrointestinal: Soft and nontender. No distention.  No CVA tenderness. Genitourinary: deferred Musculoskeletal: no pain with axial load of both hips, normal rom of hips.  Mild tenderness palpation of the anterior knee on the left.  Able to flex with some discomfort.   Full range of motion of the upper extremities.  No chest wall pain.  No palpable vertebral tenderness palpation Neurologic:  Normal speech and language. No gross focal neurologic deficits are appreciated.  Skin:  Skin is warm, dry and intact. No rash noted. Psychiatric: Mood and affect are normal. Speech and behavior are normal.  ____________________________________________   LABS (all labs ordered are listed, but only abnormal results are displayed)  Labs Reviewed  BASIC METABOLIC PANEL - Abnormal; Notable for the following components:      Result Value   Glucose, Bld 253 (*)    All other components within normal limits  CBC - Abnormal; Notable for the following components:   Hemoglobin 11.6 (*)    Platelets 141 (*)    All other components within normal limits  URINALYSIS, COMPLETE (UACMP) WITH MICROSCOPIC   ____________________________________________  EKG ED ECG REPORT I, Lavonia Drafts, the attending physician, personally viewed and interpreted this ECG.  Date: 04/22/2020  Rhythm: normal sinus rhythm QRS Axis: normal Intervals: normal ST/T Wave abnormalities: normal Narrative Interpretation: no evidence of acute ischemia  ____________________________________________  RADIOLOGY  None ____________________________________________   PROCEDURES  Procedure(s) performed: No  Procedures   Critical Care performed: No ____________________________________________   INITIAL IMPRESSION / ASSESSMENT AND PLAN / ED COURSE  Pertinent labs & imaging results that were available during my care of the patient were reviewed by me and considered in my medical decision making (see chart for details).  Patient presents after fall which she reports is common for her..  Complains of pain in her left knee and some lower back pain.  Does report history of chronic lower back pain.  Lab work today is reassuring, EKG is unremarkable.  Suspect contusion of the knee but recommended  imaging of the left knee and lower back to rule out compression injury.  Patient initially agreed to x-rays however then I was notified by nurse that she and her daughter wanted to leave without waiting for x-rays.  I did notify them that they can return anytime for further imaging.  She did have decisional capacity.  ____________________________________________   FINAL CLINICAL IMPRESSION(S) / ED DIAGNOSES  Final diagnoses:  Fall, initial encounter  Midline low back pain without sciatica, unspecified chronicity  Contusion of left knee, initial encounter        Note:  This document was prepared using Dragon voice recognition software and may include unintentional dictation errors.   Lavonia Drafts, MD 04/22/20 [phone]

## 2020-04-22 NOTE — ED Notes (Addendum)
Patient is wanting to leave AMA. ED MD is aware and has assessed patient on decision to leave AMA.  Patient is being discharged. Not leaving AMA.

## 2020-04-22 NOTE — ED Notes (Signed)
Pt has some trouble finding her words but states that has been happening for "a long time"

## 2020-06-12 ENCOUNTER — Inpatient Hospital Stay: Payer: Medicare Other

## 2020-06-12 ENCOUNTER — Other Ambulatory Visit: Payer: Self-pay

## 2020-06-12 ENCOUNTER — Emergency Department: Payer: Medicare Other

## 2020-06-12 ENCOUNTER — Encounter: Payer: Self-pay | Admitting: Emergency Medicine

## 2020-06-12 ENCOUNTER — Inpatient Hospital Stay
Admission: EM | Admit: 2020-06-12 | Discharge: 2020-06-16 | DRG: 682 | Disposition: A | Discharge status: Home Health | Payer: Medicare Other | Attending: Internal Medicine | Admitting: Internal Medicine

## 2020-06-12 DIAGNOSIS — E785 Hyperlipidemia, unspecified: Secondary | ICD-10-CM | POA: Diagnosis present

## 2020-06-12 DIAGNOSIS — B961 Klebsiella pneumoniae [K. pneumoniae] as the cause of diseases classified elsewhere: Secondary | ICD-10-CM | POA: Diagnosis present

## 2020-06-12 DIAGNOSIS — E875 Hyperkalemia: Secondary | ICD-10-CM | POA: Diagnosis present

## 2020-06-12 DIAGNOSIS — R7989 Other specified abnormal findings of blood chemistry: Secondary | ICD-10-CM

## 2020-06-12 DIAGNOSIS — I10 Essential (primary) hypertension: Secondary | ICD-10-CM | POA: Diagnosis not present

## 2020-06-12 DIAGNOSIS — G894 Chronic pain syndrome: Secondary | ICD-10-CM | POA: Diagnosis present

## 2020-06-12 DIAGNOSIS — R531 Weakness: Secondary | ICD-10-CM

## 2020-06-12 DIAGNOSIS — R0789 Other chest pain: Secondary | ICD-10-CM | POA: Diagnosis not present

## 2020-06-12 DIAGNOSIS — I110 Hypertensive heart disease with heart failure: Secondary | ICD-10-CM | POA: Diagnosis present

## 2020-06-12 DIAGNOSIS — F329 Major depressive disorder, single episode, unspecified: Secondary | ICD-10-CM | POA: Diagnosis present

## 2020-06-12 DIAGNOSIS — K219 Gastro-esophageal reflux disease without esophagitis: Secondary | ICD-10-CM | POA: Diagnosis present

## 2020-06-12 DIAGNOSIS — Z20822 Contact with and (suspected) exposure to covid-19: Secondary | ICD-10-CM | POA: Diagnosis present

## 2020-06-12 DIAGNOSIS — E1165 Type 2 diabetes mellitus with hyperglycemia: Secondary | ICD-10-CM | POA: Diagnosis present

## 2020-06-12 DIAGNOSIS — E872 Acidosis, unspecified: Secondary | ICD-10-CM | POA: Diagnosis present

## 2020-06-12 DIAGNOSIS — F0390 Unspecified dementia without behavioral disturbance: Secondary | ICD-10-CM | POA: Diagnosis present

## 2020-06-12 DIAGNOSIS — G4730 Sleep apnea, unspecified: Secondary | ICD-10-CM | POA: Diagnosis present

## 2020-06-12 DIAGNOSIS — N390 Urinary tract infection, site not specified: Secondary | ICD-10-CM | POA: Diagnosis present

## 2020-06-12 DIAGNOSIS — I252 Old myocardial infarction: Secondary | ICD-10-CM

## 2020-06-12 DIAGNOSIS — E039 Hypothyroidism, unspecified: Secondary | ICD-10-CM | POA: Diagnosis present

## 2020-06-12 DIAGNOSIS — R441 Visual hallucinations: Secondary | ICD-10-CM | POA: Diagnosis present

## 2020-06-12 DIAGNOSIS — E118 Type 2 diabetes mellitus with unspecified complications: Secondary | ICD-10-CM | POA: Diagnosis present

## 2020-06-12 DIAGNOSIS — D638 Anemia in other chronic diseases classified elsewhere: Secondary | ICD-10-CM | POA: Diagnosis present

## 2020-06-12 DIAGNOSIS — I959 Hypotension, unspecified: Secondary | ICD-10-CM | POA: Diagnosis present

## 2020-06-12 DIAGNOSIS — Z955 Presence of coronary angioplasty implant and graft: Secondary | ICD-10-CM

## 2020-06-12 DIAGNOSIS — Z6841 Body Mass Index (BMI) 40.0 and over, adult: Secondary | ICD-10-CM

## 2020-06-12 DIAGNOSIS — R079 Chest pain, unspecified: Secondary | ICD-10-CM

## 2020-06-12 DIAGNOSIS — D649 Anemia, unspecified: Secondary | ICD-10-CM

## 2020-06-12 DIAGNOSIS — Z794 Long term (current) use of insulin: Secondary | ICD-10-CM

## 2020-06-12 DIAGNOSIS — M1990 Unspecified osteoarthritis, unspecified site: Secondary | ICD-10-CM | POA: Diagnosis present

## 2020-06-12 DIAGNOSIS — Z7982 Long term (current) use of aspirin: Secondary | ICD-10-CM

## 2020-06-12 DIAGNOSIS — G9341 Metabolic encephalopathy: Secondary | ICD-10-CM

## 2020-06-12 DIAGNOSIS — Z79890 Hormone replacement therapy (postmenopausal): Secondary | ICD-10-CM

## 2020-06-12 DIAGNOSIS — I509 Heart failure, unspecified: Secondary | ICD-10-CM | POA: Diagnosis present

## 2020-06-12 DIAGNOSIS — N179 Acute kidney failure, unspecified: Secondary | ICD-10-CM | POA: Diagnosis present

## 2020-06-12 DIAGNOSIS — N189 Chronic kidney disease, unspecified: Secondary | ICD-10-CM | POA: Diagnosis present

## 2020-06-12 DIAGNOSIS — E860 Dehydration: Secondary | ICD-10-CM

## 2020-06-12 DIAGNOSIS — Z79899 Other long term (current) drug therapy: Secondary | ICD-10-CM

## 2020-06-12 DIAGNOSIS — F419 Anxiety disorder, unspecified: Secondary | ICD-10-CM | POA: Diagnosis present

## 2020-06-12 DIAGNOSIS — Z9181 History of falling: Secondary | ICD-10-CM

## 2020-06-12 DIAGNOSIS — I2510 Atherosclerotic heart disease of native coronary artery without angina pectoris: Secondary | ICD-10-CM | POA: Diagnosis present

## 2020-06-12 DIAGNOSIS — E1169 Type 2 diabetes mellitus with other specified complication: Secondary | ICD-10-CM | POA: Diagnosis present

## 2020-06-12 DIAGNOSIS — R296 Repeated falls: Secondary | ICD-10-CM | POA: Diagnosis present

## 2020-06-12 DIAGNOSIS — Z66 Do not resuscitate: Secondary | ICD-10-CM | POA: Diagnosis present

## 2020-06-12 DIAGNOSIS — M810 Age-related osteoporosis without current pathological fracture: Secondary | ICD-10-CM | POA: Diagnosis present

## 2020-06-12 LAB — CBC
HCT: 32.6 % — ABNORMAL LOW (ref 36.0–46.0)
Hemoglobin: 10.2 g/dL — ABNORMAL LOW (ref 12.0–15.0)
MCH: 28.9 pg (ref 26.0–34.0)
MCHC: 31.3 g/dL (ref 30.0–36.0)
MCV: 92.4 fL (ref 80.0–100.0)
Platelets: 128 10*3/uL — ABNORMAL LOW (ref 150–400)
RBC: 3.53 MIL/uL — ABNORMAL LOW (ref 3.87–5.11)
RDW: 16.1 % — ABNORMAL HIGH (ref 11.5–15.5)
WBC: 8.4 10*3/uL (ref 4.0–10.5)
nRBC: 0 % (ref 0.0–0.2)

## 2020-06-12 LAB — CK: Total CK: 387 U/L — ABNORMAL HIGH (ref 38–234)

## 2020-06-12 LAB — URINALYSIS, COMPLETE (UACMP) WITH MICROSCOPIC
Bilirubin Urine: NEGATIVE
Glucose, UA: NEGATIVE mg/dL
Hgb urine dipstick: NEGATIVE
Ketones, ur: NEGATIVE mg/dL
Nitrite: NEGATIVE
Protein, ur: 100 mg/dL — AB
Specific Gravity, Urine: 1.013 (ref 1.005–1.030)
WBC, UA: >50 WBC/hpf — ABNORMAL HIGH (ref 0–5)
pH: 5 (ref 5.0–8.0)

## 2020-06-12 LAB — BASIC METABOLIC PANEL
Anion gap: 12 (ref 5–15)
BUN: 101 mg/dL — ABNORMAL HIGH (ref 8–23)
CO2: 17 mmol/L — ABNORMAL LOW (ref 22–32)
Calcium: 8.5 mg/dL — ABNORMAL LOW (ref 8.9–10.3)
Chloride: 109 mmol/L (ref 98–111)
Creatinine, Ser: 4.5 mg/dL — ABNORMAL HIGH (ref 0.44–1.00)
GFR calc Af Amer: 11 mL/min — ABNORMAL LOW (ref >60)
GFR calc non Af Amer: 9 mL/min — ABNORMAL LOW (ref >60)
Glucose, Bld: 111 mg/dL — ABNORMAL HIGH (ref 70–99)
Potassium: 5.9 mmol/L — ABNORMAL HIGH (ref 3.5–5.1)
Sodium: 138 mmol/L (ref 135–145)

## 2020-06-12 LAB — HEMOGLOBIN A1C
Hgb A1c MFr Bld: 8.4 % — ABNORMAL HIGH (ref 4.8–5.6)
Mean Plasma Glucose: 194.38 mg/dL

## 2020-06-12 LAB — SARS CORONAVIRUS 2 BY RT PCR (HOSPITAL ORDER, PERFORMED IN ~~LOC~~ HOSPITAL LAB): SARS Coronavirus 2: NEGATIVE

## 2020-06-12 LAB — LACTIC ACID, PLASMA: Lactic Acid, Venous: 2.9 mmol/L (ref 0.5–1.9)

## 2020-06-12 LAB — GLUCOSE, CAPILLARY: Glucose-Capillary: 167 mg/dL — ABNORMAL HIGH (ref 70–99)

## 2020-06-12 MED ORDER — STERILE WATER FOR INJECTION IV SOLN
INTRAVENOUS | Status: DC
  Filled 2020-06-12: qty 850
  Filled 2020-06-12: qty 150
  Filled 2020-06-12 (×4): qty 850

## 2020-06-12 MED ORDER — BUSPIRONE HCL 5 MG PO TABS: 30.0000 mg | ORAL_TABLET | Freq: Two times a day (BID) | ORAL | Status: DC

## 2020-06-12 MED ORDER — ONDANSETRON HCL 4 MG PO TABS: 4.0000 mg | ORAL_TABLET | Freq: Four times a day (QID) | ORAL | Status: DC | PRN

## 2020-06-12 MED ORDER — CYCLOBENZAPRINE HCL 10 MG PO TABS
5.0000 mg | ORAL_TABLET | Freq: Every day | ORAL | Status: DC
  Administered 2020-06-13 – 2020-06-15 (×3): 5 mg via ORAL
  Filled 2020-06-12 (×5): qty 1

## 2020-06-12 MED ORDER — SODIUM CHLORIDE 0.9 % IV BOLUS
500.0000 mL | Freq: Once | INTRAVENOUS | Status: AC
  Administered 2020-06-12: 500 mL via INTRAVENOUS

## 2020-06-12 MED ORDER — ACETAMINOPHEN 650 MG RE SUPP: 650.0000 mg | Freq: Four times a day (QID) | RECTAL | Status: DC | PRN

## 2020-06-12 MED ORDER — ACETAMINOPHEN 325 MG PO TABS
650.0000 mg | ORAL_TABLET | Freq: Four times a day (QID) | ORAL | Status: DC | PRN
  Administered 2020-06-12: 650 mg via ORAL
  Filled 2020-06-12: qty 2

## 2020-06-12 MED ORDER — CLONAZEPAM 0.5 MG PO TABS
0.5000 mg | ORAL_TABLET | Freq: Every day | ORAL | Status: DC
  Administered 2020-06-12 – 2020-06-15 (×4): 0.5 mg via ORAL
  Filled 2020-06-12 (×4): qty 1

## 2020-06-12 MED ORDER — ENOXAPARIN SODIUM 30 MG/0.3ML ~~LOC~~ SOLN
30.0000 mg | SUBCUTANEOUS | Status: DC
  Filled 2020-06-12: qty 0.3

## 2020-06-12 MED ORDER — LEVOTHYROXINE SODIUM 50 MCG PO TABS
75.0000 ug | ORAL_TABLET | Freq: Every day | ORAL | Status: DC
  Administered 2020-06-13 – 2020-06-16 (×4): 75 ug via ORAL
  Filled 2020-06-12 (×4): qty 1

## 2020-06-12 MED ORDER — ONDANSETRON HCL 4 MG/2ML IJ SOLN: 4.0000 mg | Freq: Four times a day (QID) | INTRAMUSCULAR | Status: DC | PRN

## 2020-06-12 MED ORDER — VITAMIN D 25 MCG (1000 UNIT) PO TABS
1000.0000 [IU] | ORAL_TABLET | Freq: Every day | ORAL | Status: DC
  Administered 2020-06-12 – 2020-06-16 (×5): 1000 [IU] via ORAL
  Filled 2020-06-12 (×5): qty 1

## 2020-06-12 MED ORDER — ALBUTEROL SULFATE (2.5 MG/3ML) 0.083% IN NEBU: 7.5000 mg/h | INHALATION_SOLUTION | Freq: Once | RESPIRATORY_TRACT | Status: DC

## 2020-06-12 MED ORDER — DEXTROSE 50 % IV SOLN
1.0000 | Freq: Once | INTRAVENOUS | Status: AC
  Administered 2020-06-12: 50 mL via INTRAVENOUS
  Filled 2020-06-12: qty 50

## 2020-06-12 MED ORDER — ATORVASTATIN CALCIUM 20 MG PO TABS
40.0000 mg | ORAL_TABLET | Freq: Every day | ORAL | Status: DC
  Administered 2020-06-12 – 2020-06-15 (×4): 40 mg via ORAL
  Filled 2020-06-12 (×4): qty 2

## 2020-06-12 MED ORDER — INSULIN ASPART 100 UNIT/ML ~~LOC~~ SOLN
0.0000 [IU] | Freq: Three times a day (TID) | SUBCUTANEOUS | Status: DC
  Administered 2020-06-13: 11 [IU] via SUBCUTANEOUS
  Administered 2020-06-13: 7 [IU] via SUBCUTANEOUS
  Administered 2020-06-14 – 2020-06-15 (×4): 11 [IU] via SUBCUTANEOUS
  Administered 2020-06-15: 7 [IU] via SUBCUTANEOUS
  Administered 2020-06-15 – 2020-06-16 (×2): 4 [IU] via SUBCUTANEOUS
  Administered 2020-06-16: 7 [IU] via SUBCUTANEOUS
  Filled 2020-06-12 (×9): qty 1

## 2020-06-12 MED ORDER — INSULIN ASPART 100 UNIT/ML IV SOLN
10.0000 [IU] | Freq: Once | INTRAVENOUS | Status: AC
  Administered 2020-06-12: 10 [IU] via INTRAVENOUS
  Filled 2020-06-12: qty 0.1

## 2020-06-12 MED ORDER — ASPIRIN EC 81 MG PO TBEC
81.0000 mg | DELAYED_RELEASE_TABLET | Freq: Every day | ORAL | Status: DC
  Administered 2020-06-12 – 2020-06-16 (×5): 81 mg via ORAL
  Filled 2020-06-12 (×5): qty 1

## 2020-06-12 MED ORDER — TOPIRAMATE 25 MG PO TABS
50.0000 mg | ORAL_TABLET | Freq: Every day | ORAL | Status: DC
  Administered 2020-06-12 – 2020-06-16 (×5): 50 mg via ORAL
  Filled 2020-06-12 (×7): qty 2

## 2020-06-12 MED ORDER — FLUTICASONE PROPIONATE 50 MCG/ACT NA SUSP
2.0000 | Freq: Every day | NASAL | Status: DC | PRN
  Filled 2020-06-12: qty 16

## 2020-06-12 MED ORDER — QUETIAPINE FUMARATE 25 MG PO TABS
100.0000 mg | ORAL_TABLET | Freq: Every day | ORAL | Status: DC
  Administered 2020-06-12: 100 mg via ORAL
  Filled 2020-06-12: qty 4

## 2020-06-12 MED ORDER — DULOXETINE HCL 30 MG PO CPEP
60.0000 mg | ORAL_CAPSULE | Freq: Every day | ORAL | Status: DC
  Administered 2020-06-12 – 2020-06-16 (×5): 60 mg via ORAL
  Filled 2020-06-12: qty 1
  Filled 2020-06-12 (×4): qty 2

## 2020-06-12 NOTE — ED Triage Notes (Signed)
Pt presents from home via [facility] with c/o weakness and inability to walk for past 2 days. initially pt pressure 64 systolic. Pt given 1L normal saline in route. Systolic pressure improved to 74 for ems prior to arrival. CBG 117. Pt currently alert and oriented x4. Pt pale in color.

## 2020-06-12 NOTE — H&P (Signed)
History and Physical    [name]:[id number] DOB: 1946-03-03 DOA: 06/12/2020  PCP: System, Pcp Not In   Patient coming from: Home  I have personally briefly reviewed patient's old medical records in [place]  Chief Complaint: Altered mental status Most of the history was obtained from her daughter at the bedside  HPI: [name] is a 74 y.o. female with medical history significant for morbid obesity, diabetes mellitus, hypertension, depression, GERD and anxiety who was brought in by EMS after her family called because she fell at home and was unable to get up.  Patient daughter stated every time she tried to get up and her knees buckled under her.  Patient also fell about 2 days prior to this admission but was able to get up and get around without difficulty.  Daughter states that she has been having visual hallucinations and has been seeing dead family members.  Patient is very forgetful and unable to provide any history at this time.  Her oral intake has been poor but she denies having any  vomiting or diarrhea.  She complains of nausea. I am unable to do a review of systems due to her mental status changes. Patient was hypotensive upon arrival to the ER and received IV fluid resuscitation with improvement in her blood pressure to 563 systolic from 65 systolic upon presentation. Labs reveal sodium of 138, potassium of 5.9, bicarb of 17, BUN of 101 compared to baseline of 21, creatinine of 4.50 compared to baseline of 1.04, anion gap of 12, lactic acid of 2.9. Cervical spine CT showed no acute abnormality. CT scan of the head done without contrast shows no acute findings. Chest x-ray reviewed by me shows cardiomegaly.  No pleural effusion Twelve-lead EKG reviewed by me shows sinus rhythm.    ED Course: Patient is a 74 year old female who was brought in by EMS after she fell at home.  She was hypotensive upon arrival to the ER and received IV fluid resuscitation with  improvement in her blood pressure.  Labs revealed worsening of her renal function from baseline with elevated BUN and creatinine levels BUN 21 >> 101 and Cr 1.04 >>4.50.  Patient will be admitted to the hospital for further evaluation.  Review of Systems: As per HPI otherwise 10 point review of systems negative.    Past Medical History:  Diagnosis Date  . Amnesia   . Anxiety   . Arthritis   . Back pain   . CHF (congestive heart failure) ([place])   . Chronic pain syndrome   . Cystocele   . Depression   . Diabetes mellitus without complication ([place])   . GERD (gastroesophageal reflux disease)   . Hyperlipidemia   . Hypertension   . IBS (irritable bowel syndrome)   . Myocardial infarction ([place])   . Obesity   . Osteoporosis   . Osteoporosis   . Shortness of breath dyspnea   . Sleep apnea    not using now  . Thyroid nodule     Past Surgical History:  Procedure Laterality Date  . ABDOMINAL HYSTERECTOMY    . APPENDECTOMY    . CARDIAC CATHETERIZATION    . CHOLECYSTECTOMY    . CORONARY STENT INTERVENTION N/A 05/05/2018   Procedure: CORONARY STENT INTERVENTION;  Surgeon: Isaias Cowman, MD;  Location: [facility];  Service: Cardiovascular;  Laterality: N/A;  . FOOT SURGERY    . LEFT HEART CATH AND CORONARY ANGIOGRAPHY Left 05/05/2018   Procedure: LEFT  HEART CATH AND CORONARY ANGIOGRAPHY;  Surgeon: Isaias Cowman, MD;  Location: [facility];  Service: Cardiovascular;  Laterality: Left;  . OOPHORECTOMY    . REPLACEMENT TOTAL KNEE    . TONSILLECTOMY       reports that she has never smoked. She has never used smokeless tobacco. She reports that she does not drink alcohol and does not use drugs.  No Known Allergies  Family History  Problem Relation Age of Onset  . Cancer Mother   . Cancer Father   . Cancer Sister   . Thyroid disease Neg Hx      Prior to Admission medications   Medication Sig Start Date End Date Taking? Authorizing Provider    albuterol (VENTOLIN HFA) 108 (90 Base) MCG/ACT inhaler Inhale 2 puffs into the lungs every 4 (four) hours as needed for shortness of breath or wheezing. 12/12/19   [provider]  aspirin EC 81 MG tablet Take 81 mg by mouth daily.    [provider]  atorvastatin (LIPITOR) 40 MG tablet Take 40 mg by mouth at bedtime. 12/06/19   [provider]  busPIRone (BUSPAR) 30 MG tablet Take 30 mg by mouth 2 (two) times daily.    [provider]  Cholecalciferol (VITAMIN D3) 1000 units CAPS Take 1,000 Units by mouth daily.     [provider]  clonazePAM (KLONOPIN) 0.5 MG tablet Take 1 tablet (0.5 mg total) by mouth daily. 02/07/20   Lorella Nimrod, MD  cyclobenzaprine (FLEXERIL) 5 MG tablet Take 1 tablet (5 mg total) by mouth daily. 02/08/20   Lorella Nimrod, MD  DULoxetine (CYMBALTA) 60 MG capsule Take 60 mg by mouth daily.    [provider]  fluticasone (FLONASE) 50 MCG/ACT nasal spray Place 2 sprays into both nostrils daily as needed for allergies or rhinitis.  01/27/20   [provider]  furosemide (LASIX) 20 MG tablet Take 20 mg by mouth 2 (two) times daily as needed for fluid or edema.  01/28/20   [provider]  gabapentin (NEURONTIN) 300 MG capsule Take 1 capsule (300 mg total) by mouth 3 (three) times daily. 02/07/20   Lorella Nimrod, MD  LANTUS SOLOSTAR 100 UNIT/ML Solostar Pen Inject 75 Units into the skin at bedtime.     [provider]  levothyroxine (SYNTHROID) 75 MCG tablet Take 1 tablet (75 mcg total) by mouth daily before breakfast. 02/08/20   Lorella Nimrod, MD  lisinopril (ZESTRIL) 40 MG tablet Take 1 tablet (40 mg total) by mouth daily. 02/08/20   Lorella Nimrod, MD  metFORMIN (GLUCOPHAGE-XR) 500 MG 24 hr tablet Take 1,000 mg by mouth 2 (two) times daily. 01/18/20   [provider]  metoprolol succinate (TOPROL-XL) 50 MG 24 hr tablet Take 50 mg by mouth 2 (two) times daily.     [provider]  nystatin  (MYCOSTATIN) powder Apply 1 application topically 2 (two) times daily as needed (rashy areas in skin folds).     [provider]  ondansetron (ZOFRAN) 4 MG tablet Take 4 mg by mouth every 8 (eight) hours as needed for nausea/vomiting. 01/28/20   [provider]  Oxycodone HCl 10 MG TABS Take 1 tablet (10 mg total) by mouth 2 (two) times daily as needed. 03/08/20   Ezekiel Slocumb, DO  QUEtiapine (SEROQUEL) 200 MG tablet Take 100-200 mg by mouth at bedtime. 01/20/20   [provider]  topiramate (TOPAMAX) 50 MG tablet Take 1 tablet (50 mg total) by mouth  daily. 02/08/20   Lorella Nimrod, MD    Physical Exam: Vitals:   06/12/20 1153 06/12/20 1210 06/12/20 1224 06/12/20 1430  BP: (!) 96/41 91/77  136/63  Pulse: 80  81   Resp: 17 17 20  (!) 24  Temp: 98.2 F (36.8 C)     TempSrc: Oral     SpO2: 99%  96%   Weight:      Height:         Vitals:   06/12/20 1153 06/12/20 1210 06/12/20 1224 06/12/20 1430  BP: (!) 96/41 91/77  136/63  Pulse: 80  81   Resp: 17 17 20  (!) 24  Temp: 98.2 F (36.8 C)     TempSrc: Oral     SpO2: 99%  96%   Weight:      Height:        Constitutional: NAD, alert and oriented x 2.  Oriented to person and place Eyes: PERRL, lids and conjunctivae pallor ENMT: Mucous membranes are dry.  Neck: normal, supple, no masses, no thyromegaly Respiratory: clear to auscultation bilaterally, no wheezing, no crackles. Normal respiratory effort. No accessory muscle use.  Cardiovascular: Regular rate and rhythm, no murmurs / rubs / gallops. No extremity edema. 2+ pedal pulses. No carotid bruits.  Abdomen: no tenderness, no masses palpated. No hepatosplenomegaly. Bowel sounds positive.  Musculoskeletal: no clubbing / cyanosis. No joint deformity upper and lower extremities.  Skin: no rashes, lesions, ulcers.  Neurologic: Generalized weakness Psychiatric: Flat affect.   Labs on Admission: I have personally reviewed following labs and imaging  studies  CBC: Recent Labs  Lab 06/12/20 1149  WBC 8.4  HGB 10.2*  HCT 32.6*  MCV 92.4  PLT 878*   Basic Metabolic Panel: Recent Labs  Lab 06/12/20 1149  NA 138  K 5.9*  CL 109  CO2 17*  GLUCOSE 111*  BUN 101*  CREATININE 4.50*  CALCIUM 8.5*   GFR: Estimated Creatinine Clearance: 15.5 mL/min (A) (by C-G formula based on SCr of 4.5 mg/dL (H)). Liver Function Tests: No results for input(s): AST, ALT, ALKPHOS, BILITOT, PROT, ALBUMIN in the last 168 hours. No results for input(s): LIPASE, AMYLASE in the last 168 hours. No results for input(s): AMMONIA in the last 168 hours. Coagulation Profile: No results for input(s): INR, PROTIME in the last 168 hours. Cardiac Enzymes: No results for input(s): CKTOTAL, CKMB, CKMBINDEX, TROPONINI in the last 168 hours. BNP (last 3 results) No results for input(s): PROBNP in the last 8760 hours. HbA1C: No results for input(s): HGBA1C in the last 72 hours. CBG: No results for input(s): GLUCAP in the last 168 hours. Lipid Profile: No results for input(s): CHOL, HDL, LDLCALC, TRIG, CHOLHDL, LDLDIRECT in the last 72 hours. Thyroid Function Tests: No results for input(s): TSH, T4TOTAL, FREET4, T3FREE, THYROIDAB in the last 72 hours. Anemia Panel: No results for input(s): VITAMINB12, FOLATE, FERRITIN, TIBC, IRON, RETICCTPCT in the last 72 hours. Urine analysis:    Component Value Date/Time   COLORURINE YELLOW 03/06/2020 0725   APPEARANCEUR CLOUDY (A) 03/06/2020 0725   APPEARANCEUR Cloudy (A) 07/26/2016 1119   LABSPEC 1.025 03/06/2020 0725   LABSPEC 1.024 01/30/2014 0026   PHURINE 5.5 03/06/2020 0725   GLUCOSEU NEGATIVE 03/06/2020 0725   GLUCOSEU >=500 01/30/2014 0026   HGBUR MODERATE (A) 03/06/2020 0725   BILIRUBINUR NEGATIVE 03/06/2020 0725   BILIRUBINUR Negative 07/26/2016 1119   BILIRUBINUR Negative 01/30/2014 0026   KETONESUR NEGATIVE 03/06/2020 0725   PROTEINUR >300 (A) 03/06/2020 0725   UROBILINOGEN 1.0  07/11/2008 1245    NITRITE NEGATIVE 03/06/2020 0725   LEUKOCYTESUR LARGE (A) 03/06/2020 0725   LEUKOCYTESUR Trace 01/30/2014 0026    Radiological Exams on Admission: CT Head Wo Contrast  Result Date: 06/12/2020 CLINICAL DATA:  Weakness and unable to walk for 2 days.  Neck pain. EXAM: CT HEAD WITHOUT CONTRAST CT CERVICAL SPINE WITHOUT CONTRAST TECHNIQUE: Multidetector CT imaging of the head and cervical spine was performed following the standard protocol without intravenous contrast. Multiplanar CT image reconstructions of the cervical spine were also generated. COMPARISON:  03/06/2020 FINDINGS: CT HEAD FINDINGS Brain: No evidence of acute infarction, hemorrhage, extra-axial collection, ventriculomegaly, or mass effect. Generalized cerebral atrophy. Periventricular white matter low attenuation likely secondary to microangiopathy. Vascular: Cerebrovascular atherosclerotic calcifications are noted. Skull: Negative for fracture or focal lesion. Sinuses/Orbits: Visualized portions of the orbits are unremarkable. Visualized portions of the paranasal sinuses are unremarkable. Visualized portions of the mastoid air cells are unremarkable. Other: None. CT CERVICAL SPINE FINDINGS Alignment: Normal. Skull base and vertebrae: No acute fracture. No primary bone lesion or focal pathologic process. Soft tissues and spinal canal: No prevertebral fluid or swelling. No visible canal hematoma. Disc levels: Degenerative disease with disc height loss at C5-6. Mild bilateral facet arthropathy throughout the cervical spine most severe at C3-4 and C4-5 on the left. Upper chest: Lung apices are clear. Other: No fluid collection or hematoma. IMPRESSION: 1. No acute intracranial pathology. 2. No acute osseous injury of the cervical spine. 3. Cervical spine spondylosis as described above. Electronically Signed   By: Kathreen Devoid   On: 06/12/2020 13:15   CT Cervical Spine Wo Contrast  Result Date: 06/12/2020 CLINICAL DATA:  Weakness and unable to walk  for 2 days.  Neck pain. EXAM: CT HEAD WITHOUT CONTRAST CT CERVICAL SPINE WITHOUT CONTRAST TECHNIQUE: Multidetector CT imaging of the head and cervical spine was performed following the standard protocol without intravenous contrast. Multiplanar CT image reconstructions of the cervical spine were also generated. COMPARISON:  03/06/2020 FINDINGS: CT HEAD FINDINGS Brain: No evidence of acute infarction, hemorrhage, extra-axial collection, ventriculomegaly, or mass effect. Generalized cerebral atrophy. Periventricular white matter low attenuation likely secondary to microangiopathy. Vascular: Cerebrovascular atherosclerotic calcifications are noted. Skull: Negative for fracture or focal lesion. Sinuses/Orbits: Visualized portions of the orbits are unremarkable. Visualized portions of the paranasal sinuses are unremarkable. Visualized portions of the mastoid air cells are unremarkable. Other: None. CT CERVICAL SPINE FINDINGS Alignment: Normal. Skull base and vertebrae: No acute fracture. No primary bone lesion or focal pathologic process. Soft tissues and spinal canal: No prevertebral fluid or swelling. No visible canal hematoma. Disc levels: Degenerative disease with disc height loss at C5-6. Mild bilateral facet arthropathy throughout the cervical spine most severe at C3-4 and C4-5 on the left. Upper chest: Lung apices are clear. Other: No fluid collection or hematoma. IMPRESSION: 1. No acute intracranial pathology. 2. No acute osseous injury of the cervical spine. 3. Cervical spine spondylosis as described above. Electronically Signed   By: Kathreen Devoid   On: 06/12/2020 13:15   DG Chest Portable 1 View  Result Date: 06/12/2020 CLINICAL DATA:  Weakness. EXAM: PORTABLE CHEST 1 VIEW COMPARISON:  03/06/2020 FINDINGS: Stable cardiomegaly. Low lung volumes. Central venous congestion. No focal infiltrate. No pneumothorax. IMPRESSION: Cardiomegaly and central venous congestion.  Low lung volumes. Electronically Signed    By: Suzy Bouchard M.D.   On: 06/12/2020 13:58    EKG: Independently reviewed.  Sinus rhythm  Assessment/Plan Principal Problem:   AKI (acute kidney injury) (  [place]) Active Problems:   Diabetes mellitus with complication ([place])   Essential hypertension   CAD (coronary artery disease)   Acute metabolic encephalopathy   Morbid obesity with BMI of 45.0-49.9, adult ([facility])   Lactic acidosis   Hyperkalemia     Acute kidney injury Etiology unclear Patient serum creatinine is 4.50 compared to a baseline of 1.06 and BUN is 101 compared to baseline of 21 Hold diuretics for now Will obtain CK levels to rule out rhabdomyolysis We will obtain renal ultrasound to rule out obstructive uropathy We will place patient on a bicarbonate infusion Request nephrology consult Repeat renal parameters in a.m.   Acute metabolic encephalopathy Most likely secondary to uremia Expect improvement in patient's mental status following improvement in renal function   Hyperkalemia Secondary to acute kidney injury as well as medication induced Discontinue lisinopril Treat with dextrose, insulin, bronchodilators and bicarbonate Repeat electrolytes   Diabetes mellitus Maintain consistent carbohydrate diet Glycemic control with sliding scale insulin   Morbid obesity (BMI 67.12) Complicates overall prognosis and care   Lactic acidosis Secondary to Metformin use No evidence of sepsis at this time   Hypothyroidism Continue Synthroid   Anxiety and depression Continue clonazepam, duloxetine and Seroquel   Hypertension Hold all antihypertensive medications for now since patient is normotensive   DVT prophylaxis: Lovenox Code Status: DNR Family Communication: Greater than 50% of time was spent discussing patient's condition and plan of care with her daughter at the bedside. All questions and concerns have been addressed. She verbalizes understanding and agrees with the plan.  CODE STATUS was  discussed and she wants her mother placed in a DO NOT RESUSCITATE status Disposition Plan: Back to previous home environment Consults called: Nephrology    Collier Bullock MD [facility]     06/12/2020, 3:31 PM

## 2020-06-12 NOTE — ED Notes (Signed)
Lab at bedside

## 2020-06-12 NOTE — ED Provider Notes (Signed)
[facility] Emergency Department Provider Note ____________________________________________   First MD Initiated Contact with Patient 06/12/20 1154     (approximate)  I have reviewed the triage vital signs and the nursing notes.  HISTORY  Chief Complaint Weakness   HPI [name] is a 74 y.o. femalewho presents to the ED for evaluation of generalized weakness.  Chart review indicates medical history as below.  Patient presents to the ED with complaints of feeling "so weak."  Patient reports generalized weakness throughout her body, otherwise has no acute complaints.  Patient with daughter provides further history, indicating that patient has seemed "off" for the past 1 week.  She reports that the patient has been "talking out of her head," and "stumbling all over the place."  Daughter reports that these complaints and symptoms have been worse over the past 2 days.  She reports that patient fell yesterday, but denies any discrete injury or head trauma.  Daughter is a poor historian.  History is limited due to poor historian by the daughter, and patient's generalized weakness and poor ability to provide history.    Past Medical History:  Diagnosis Date  . Amnesia   . Anxiety   . Arthritis   . Back pain   . CHF (congestive heart failure) ([place])   . Chronic pain syndrome   . Cystocele   . Depression   . Diabetes mellitus without complication ([place])   . GERD (gastroesophageal reflux disease)   . Hyperlipidemia   . Hypertension   . IBS (irritable bowel syndrome)   . Myocardial infarction ([place])   . Obesity   . Osteoporosis   . Osteoporosis   . Shortness of breath dyspnea   . Sleep apnea    not using now  . Thyroid nodule     Patient Active Problem List   Diagnosis Date Noted  . AKI (acute kidney injury) ([place]) 03/06/2020  . Hyperkalemia 03/06/2020  . Acute metabolic encephalopathy 61/44/3154  . Acute respiratory failure with hypoxia ([place])  02/06/2020  . OSA (obstructive sleep apnea) 02/06/2020  . CPAP ventilation treatment not tolerated 02/06/2020  . Morbid obesity with BMI of 45.0-49.9, adult ([place]) 02/06/2020  . Lactic acidosis 02/06/2020  . possible Sepsis ([place]) 02/06/2020  . Polypharmacy 02/06/2020  . CAD (coronary artery disease) 05/05/2018  . Microscopic hematuria 09/17/2016  . Cystitis, chronic 09/17/2016  . Adrenal cortical nodule ([place]) LEFT, small 09/17/2016  . Closed fracture of metatarsal bone 07/22/2016  . Plantar fasciitis, left 05/28/2016  . Degenerative spondylolisthesis 01/02/2016  . Tachycardia 01/02/2016  . Diabetes mellitus with complication ([place])   . Essential hypertension   . Non-toxic multinodular goiter 10/11/2014  . Diabetes mellitus ([place]) 10/11/2014  . Hyperlipidemia 10/11/2014  . Anxiety and depression 10/11/2014  . Osteoporosis 10/11/2014  . GERD (gastroesophageal reflux disease) 10/11/2014  . Arthritis 10/11/2014  . Cystocele 10/11/2014  . Chronic pain syndrome 10/11/2014  . Amnesia 10/11/2014  . Back pain 10/11/2014    Past Surgical History:  Procedure Laterality Date  . ABDOMINAL HYSTERECTOMY    . APPENDECTOMY    . CARDIAC CATHETERIZATION    . CHOLECYSTECTOMY    . CORONARY STENT INTERVENTION N/A 05/05/2018   Procedure: CORONARY STENT INTERVENTION;  Surgeon: Isaias Cowman, MD;  Location: [facility];  Service: Cardiovascular;  Laterality: N/A;  . FOOT SURGERY    . LEFT HEART CATH AND CORONARY ANGIOGRAPHY Left 05/05/2018   Procedure: LEFT HEART CATH AND CORONARY ANGIOGRAPHY;  Surgeon: Isaias Cowman, MD;  Location: [facility];  Service: Cardiovascular;  Laterality: Left;  . OOPHORECTOMY    . REPLACEMENT TOTAL KNEE    . TONSILLECTOMY      Prior to Admission medications   Medication Sig Start Date End Date Taking? Authorizing Provider  albuterol (VENTOLIN HFA) 108 (90 Base) MCG/ACT inhaler Inhale 2 puffs into the lungs every 4 (four) hours as needed  for shortness of breath or wheezing. 12/12/19   [provider]  aspirin EC 81 MG tablet Take 81 mg by mouth daily.    [provider]  atorvastatin (LIPITOR) 40 MG tablet Take 40 mg by mouth at bedtime. 12/06/19   [provider]  busPIRone (BUSPAR) 30 MG tablet Take 30 mg by mouth 2 (two) times daily.    [provider]  Cholecalciferol (VITAMIN D3) 1000 units CAPS Take 1,000 Units by mouth daily.     [provider]  clonazePAM (KLONOPIN) 0.5 MG tablet Take 1 tablet (0.5 mg total) by mouth daily. 02/07/20   Lorella Nimrod, MD  cyclobenzaprine (FLEXERIL) 5 MG tablet Take 1 tablet (5 mg total) by mouth daily. 02/08/20   Lorella Nimrod, MD  DULoxetine (CYMBALTA) 60 MG capsule Take 60 mg by mouth daily.    [provider]  fluticasone (FLONASE) 50 MCG/ACT nasal spray Place 2 sprays into both nostrils daily as needed for allergies or rhinitis.  01/27/20   [provider]  furosemide (LASIX) 20 MG tablet Take 20 mg by mouth 2 (two) times daily as needed for fluid or edema.  01/28/20   [provider]  gabapentin (NEURONTIN) 300 MG capsule Take 1 capsule (300 mg total) by mouth 3 (three) times daily. 02/07/20   Lorella Nimrod, MD  LANTUS SOLOSTAR 100 UNIT/ML Solostar Pen Inject 75 Units into the skin at bedtime.     [provider]  levothyroxine (SYNTHROID) 75 MCG tablet Take 1 tablet (75 mcg total) by mouth daily before breakfast. 02/08/20   Lorella Nimrod, MD  lisinopril (ZESTRIL) 40 MG tablet Take 1 tablet (40 mg total) by mouth daily. 02/08/20   Lorella Nimrod, MD  metFORMIN (GLUCOPHAGE-XR) 500 MG 24 hr tablet Take 1,000 mg by mouth 2 (two) times daily. 01/18/20   [provider]  metoprolol succinate (TOPROL-XL) 50 MG 24 hr tablet Take 50 mg by mouth 2 (two) times daily.     [provider]  nystatin (MYCOSTATIN) powder Apply 1 application topically 2 (two) times daily as needed (rashy areas in skin folds).     [provider]  ondansetron (ZOFRAN) 4 MG tablet Take 4 mg by mouth every 8 (eight) hours as needed for nausea/vomiting. 01/28/20   [provider]  Oxycodone HCl 10 MG TABS Take 1 tablet (10 mg total) by mouth 2 (two) times daily as needed. 03/08/20   Ezekiel Slocumb, DO  QUEtiapine (SEROQUEL) 200 MG tablet Take 100-200 mg by mouth at bedtime. 01/20/20   [provider]  topiramate (TOPAMAX) 50 MG tablet Take 1 tablet (50 mg total) by mouth daily. 02/08/20   Lorella Nimrod, MD    Allergies Patient has no known allergies.  Family History  Problem Relation Age of Onset  . Cancer Mother   . Cancer Father   . Cancer Sister   . Thyroid disease Neg Hx     Social History Social History   Tobacco Use  . Smoking status: Never Smoker  . Smokeless tobacco: Never Used  Vaping Use  . Vaping Use:  Never used  Substance Use Topics  . Alcohol use: No  . Drug use: Never    Review of Systems  Constitutional: No fever/chills.  Positive for generalized weakness Eyes: No visual changes. ENT: No sore throat. Cardiovascular: Denies chest pain. Respiratory: Denies shortness of breath. Gastrointestinal: No abdominal pain.  No nausea, no vomiting.  No diarrhea.  No constipation. Genitourinary: Negative for dysuria. Musculoskeletal: Negative for back pain. Skin: Negative for rash. Neurological: Negative for headaches, focal weakness or numbness.   ____________________________________________   PHYSICAL EXAM:  VITAL SIGNS: Vitals:   06/12/20 1224 06/12/20 1430  BP:  136/63  Pulse: 81   Resp: 20 (!) 24  Temp:    SpO2: 96%       Constitutional: Alert and oriented..  Obese.  Supine in bed, follows commands in all 4 extremities.  No distress. Eyes: Conjunctivae are normal. PERRL. EOMI. Head: Atraumatic. Nose: No congestion/rhinnorhea. Mouth/Throat: Mucous membranes are dry.  Oropharynx non-erythematous. Neck: No stridor. No cervical spine tenderness to  palpation. Cardiovascular: Normal rate, regular rhythm. Grossly normal heart sounds.  Good peripheral circulation. Respiratory: Normal respiratory effort.  No retractions. Lungs CTAB. Gastrointestinal: Soft , nondistended, nontender to palpation. No abdominal bruits. No CVA tenderness. Musculoskeletal: No lower extremity tenderness nor edema.  No joint effusions. No signs of acute trauma. Neurologic:  Normal speech and language. No gross focal neurologic deficits are appreciated. No gait instability noted. Cranial nerves II through XII intact 5/5 strength and sensation in all 4 extremities Skin:  Skin is warm, dry and intact. No rash noted. Psychiatric: Mood and affect are normal. Speech and behavior are normal.  ____________________________________________   LABS (all labs ordered are listed, but only abnormal results are displayed)  Labs Reviewed  BASIC METABOLIC PANEL - Abnormal; Notable for the following components:      Result Value   Potassium 5.9 (*)    CO2 17 (*)    Glucose, Bld 111 (*)    BUN 101 (*)    Creatinine, Ser 4.50 (*)    Calcium 8.5 (*)    GFR calc non Af Amer 9 (*)    GFR calc Af Amer 11 (*)    All other components within normal limits  CBC - Abnormal; Notable for the following components:   RBC 3.53 (*)    Hemoglobin 10.2 (*)    HCT 32.6 (*)    RDW 16.1 (*)    Platelets 128 (*)    All other components within normal limits  LACTIC ACID, PLASMA - Abnormal; Notable for the following components:   Lactic Acid, Venous 2.9 (*)    All other components within normal limits  SARS CORONAVIRUS 2 BY RT PCR (HOSPITAL ORDER, [facility])  CULTURE, BLOOD (ROUTINE X 2)  CULTURE, BLOOD (ROUTINE X 2)  URINALYSIS, COMPLETE (UACMP) WITH MICROSCOPIC   ____________________________________________  12 Lead EKG Sinus rhythm, rate of 81 bpm, normal axis and intervals.  No evidence of acute  ischemia.  ____________________________________________  RADIOLOGY  ED MD interpretation: CT head without evidence of acute intracranial pathology.  Obtain due to concern for ICH as a source of generalized weakness  Official radiology report(s): CT Head Wo Contrast  Result Date: 06/12/2020 CLINICAL DATA:  Weakness and unable to walk for 2 days.  Neck pain. EXAM: CT HEAD WITHOUT CONTRAST CT CERVICAL SPINE WITHOUT CONTRAST TECHNIQUE: Multidetector CT imaging of the head and cervical spine was performed following the standard protocol without intravenous contrast. Multiplanar CT image reconstructions  of the cervical spine were also generated. COMPARISON:  03/06/2020 FINDINGS: CT HEAD FINDINGS Brain: No evidence of acute infarction, hemorrhage, extra-axial collection, ventriculomegaly, or mass effect. Generalized cerebral atrophy. Periventricular white matter low attenuation likely secondary to microangiopathy. Vascular: Cerebrovascular atherosclerotic calcifications are noted. Skull: Negative for fracture or focal lesion. Sinuses/Orbits: Visualized portions of the orbits are unremarkable. Visualized portions of the paranasal sinuses are unremarkable. Visualized portions of the mastoid air cells are unremarkable. Other: None. CT CERVICAL SPINE FINDINGS Alignment: Normal. Skull base and vertebrae: No acute fracture. No primary bone lesion or focal pathologic process. Soft tissues and spinal canal: No prevertebral fluid or swelling. No visible canal hematoma. Disc levels: Degenerative disease with disc height loss at C5-6. Mild bilateral facet arthropathy throughout the cervical spine most severe at C3-4 and C4-5 on the left. Upper chest: Lung apices are clear. Other: No fluid collection or hematoma. IMPRESSION: 1. No acute intracranial pathology. 2. No acute osseous injury of the cervical spine. 3. Cervical spine spondylosis as described above. Electronically Signed   By: Kathreen Devoid   On: 06/12/2020 13:15    CT Cervical Spine Wo Contrast  Result Date: 06/12/2020 CLINICAL DATA:  Weakness and unable to walk for 2 days.  Neck pain. EXAM: CT HEAD WITHOUT CONTRAST CT CERVICAL SPINE WITHOUT CONTRAST TECHNIQUE: Multidetector CT imaging of the head and cervical spine was performed following the standard protocol without intravenous contrast. Multiplanar CT image reconstructions of the cervical spine were also generated. COMPARISON:  03/06/2020 FINDINGS: CT HEAD FINDINGS Brain: No evidence of acute infarction, hemorrhage, extra-axial collection, ventriculomegaly, or mass effect. Generalized cerebral atrophy. Periventricular white matter low attenuation likely secondary to microangiopathy. Vascular: Cerebrovascular atherosclerotic calcifications are noted. Skull: Negative for fracture or focal lesion. Sinuses/Orbits: Visualized portions of the orbits are unremarkable. Visualized portions of the paranasal sinuses are unremarkable. Visualized portions of the mastoid air cells are unremarkable. Other: None. CT CERVICAL SPINE FINDINGS Alignment: Normal. Skull base and vertebrae: No acute fracture. No primary bone lesion or focal pathologic process. Soft tissues and spinal canal: No prevertebral fluid or swelling. No visible canal hematoma. Disc levels: Degenerative disease with disc height loss at C5-6. Mild bilateral facet arthropathy throughout the cervical spine most severe at C3-4 and C4-5 on the left. Upper chest: Lung apices are clear. Other: No fluid collection or hematoma. IMPRESSION: 1. No acute intracranial pathology. 2. No acute osseous injury of the cervical spine. 3. Cervical spine spondylosis as described above. Electronically Signed   By: Kathreen Devoid   On: 06/12/2020 13:15   DG Chest Portable 1 View  Result Date: 06/12/2020 CLINICAL DATA:  Weakness. EXAM: PORTABLE CHEST 1 VIEW COMPARISON:  03/06/2020 FINDINGS: Stable cardiomegaly. Low lung volumes. Central venous congestion. No focal infiltrate. No  pneumothorax. IMPRESSION: Cardiomegaly and central venous congestion.  Low lung volumes. Electronically Signed   By: Suzy Bouchard M.D.   On: 06/12/2020 13:58    ____________________________________________   PROCEDURES and INTERVENTIONS  Procedure(s) performed (including Critical Care):  Procedures  Medications  sodium chloride 0.9 % bolus 500 mL (0 mLs Intravenous Stopped 06/12/20 1316)    ____________________________________________   INITIAL IMPRESSION / ASSESSMENT AND PLAN / ED COURSE  74 year old woman presenting from home generalized weakness and recurrent falls, with evidence of AKI and dehydration, requiring medical admission.  Patient initially with soft blood pressures, though fluid responsive, vitals otherwise normal on room air.  Exam thin obese patient without evidence of focal neurologic deficits, distress or signs of trauma.  She has  dry mucous membranes and signs of dehydration overall.  Blood work with significant metabolic derangements to suggest severe dehydration, AKI that is prerenal in nature.  Patient provided total of 2 L of normal saline between prehospital and ED provision..  CT imaging without evidence of ICH or stroke.  Due to the severity of her metabolic derangements and her continued unsteady gait, will admit the patient to hospitalist medicine for further work-up and management.  Clinical Course as of Jun 13 1527  Mon Jun 12, 2020  1241 Daughter at the bedside. She reports that patient has had recurrent falls and generalized weakness that has been progressively worsening over the past 1 week. She reports no fevers or acute illnesses, but concerned about her being more "stumbling"   [DS]    Clinical Course User Index [DS] Vladimir Crofts, MD     ____________________________________________   FINAL CLINICAL IMPRESSION(S) / ED DIAGNOSES  Final diagnoses:  Generalized weakness  AKI (acute kidney injury) ([place])  Hyperkalemia  Lactic acidosis   Prerenal azotemia  Normocytic anemia  Dehydration     ED Discharge Orders    None       Makenzie Vittorio   Note:  This document was prepared using Dragon voice recognition software and may include unintentional dictation errors.   Vladimir Crofts, MD 06/12/20 [phone]

## 2020-06-12 NOTE — ED Notes (Signed)
Report attempted. Rn and charge unavailable to take report. RN to call back by 15 minutes. Will take pt to floor at 2135.

## 2020-06-12 NOTE — ED Notes (Signed)
Lab called at this time for blood collection

## 2020-06-12 NOTE — ED Notes (Signed)
Report attempted with Langley Gauss, RN for 2A. Per Langley Gauss, she had read report and did not have any question regarding this pt. Pt to be transported by transport team.

## 2020-06-12 NOTE — ED Notes (Signed)
Lab called for repeat blood work

## 2020-06-13 ENCOUNTER — Encounter: Payer: Self-pay | Admitting: Internal Medicine

## 2020-06-13 DIAGNOSIS — E875 Hyperkalemia: Secondary | ICD-10-CM

## 2020-06-13 DIAGNOSIS — E118 Type 2 diabetes mellitus with unspecified complications: Secondary | ICD-10-CM

## 2020-06-13 DIAGNOSIS — I10 Essential (primary) hypertension: Secondary | ICD-10-CM

## 2020-06-13 DIAGNOSIS — Z6841 Body Mass Index (BMI) 40.0 and over, adult: Secondary | ICD-10-CM

## 2020-06-13 LAB — CBC
HCT: 33.5 % — ABNORMAL LOW (ref 36.0–46.0)
Hemoglobin: 10.4 g/dL — ABNORMAL LOW (ref 12.0–15.0)
MCH: 28 pg (ref 26.0–34.0)
MCHC: 31 g/dL (ref 30.0–36.0)
MCV: 90.1 fL (ref 80.0–100.0)
Platelets: 131 10*3/uL — ABNORMAL LOW (ref 150–400)
RBC: 3.72 MIL/uL — ABNORMAL LOW (ref 3.87–5.11)
RDW: 15.9 % — ABNORMAL HIGH (ref 11.5–15.5)
WBC: 6.2 10*3/uL (ref 4.0–10.5)
nRBC: 0 % (ref 0.0–0.2)

## 2020-06-13 LAB — BASIC METABOLIC PANEL
Anion gap: 14 (ref 5–15)
BUN: 98 mg/dL — ABNORMAL HIGH (ref 8–23)
CO2: 19 mmol/L — ABNORMAL LOW (ref 22–32)
Calcium: 9 mg/dL (ref 8.9–10.3)
Chloride: 107 mmol/L (ref 98–111)
Creatinine, Ser: 3.46 mg/dL — ABNORMAL HIGH (ref 0.44–1.00)
GFR calc Af Amer: 14 mL/min — ABNORMAL LOW (ref >60)
GFR calc non Af Amer: 12 mL/min — ABNORMAL LOW (ref >60)
Glucose, Bld: 213 mg/dL — ABNORMAL HIGH (ref 70–99)
Potassium: 4.7 mmol/L (ref 3.5–5.1)
Sodium: 140 mmol/L (ref 135–145)

## 2020-06-13 LAB — GLUCOSE, CAPILLARY
Glucose-Capillary: 212 mg/dL — ABNORMAL HIGH (ref 70–99)
Glucose-Capillary: 242 mg/dL — ABNORMAL HIGH (ref 70–99)
Glucose-Capillary: 266 mg/dL — ABNORMAL HIGH (ref 70–99)
Glucose-Capillary: 212 mg/dL — ABNORMAL HIGH (ref 70–99)

## 2020-06-13 MED ORDER — INSULIN GLARGINE 100 UNIT/ML ~~LOC~~ SOLN
25.0000 [IU] | Freq: Every day | SUBCUTANEOUS | Status: DC
  Administered 2020-06-13: 25 [IU] via SUBCUTANEOUS
  Filled 2020-06-13 (×2): qty 0.25

## 2020-06-13 MED ORDER — SODIUM BICARBONATE-DEXTROSE 150-5 MEQ/L-% IV SOLN
150.0000 meq | INTRAVENOUS | Status: DC
  Filled 2020-06-13 (×2): qty 1000

## 2020-06-13 MED ORDER — SODIUM BICARBONATE 8.4 % IV SOLN
INTRAVENOUS | Status: DC
  Filled 2020-06-13 (×5): qty 150

## 2020-06-13 MED ORDER — ENOXAPARIN SODIUM 40 MG/0.4ML ~~LOC~~ SOLN
40.0000 mg | SUBCUTANEOUS | Status: DC
  Administered 2020-06-13: 40 mg via SUBCUTANEOUS
  Filled 2020-06-13: qty 0.4

## 2020-06-13 MED ORDER — SODIUM CHLORIDE 0.9 % IV SOLN
1.0000 g | INTRAVENOUS | Status: DC
  Administered 2020-06-13: 1 g via INTRAVENOUS
  Filled 2020-06-13 (×2): qty 10

## 2020-06-13 MED ORDER — QUETIAPINE FUMARATE 25 MG PO TABS
100.0000 mg | ORAL_TABLET | Freq: Every day | ORAL | Status: DC
  Administered 2020-06-13 – 2020-06-15 (×3): 100 mg via ORAL
  Filled 2020-06-13 (×3): qty 4

## 2020-06-13 NOTE — TOC Initial Note (Signed)
Transition of Care [facility]) - Initial/Assessment Note    Patient Details  Name: [name] MRN: [id number] Date of Birth: 12-14-1945  Transition of Care [facility]) CM/SW Contact:    Eileen Stanford, LCSW Phone Number: 06/13/2020, 3:04 PM  Clinical Narrative:  Pt is alert and oriented x3. Pt states she will be d/c home with her daughter. Pt states she is agreeable to [facility]. Pt did not have a agency preference. Pt states she has a walker at home. CSW will await PT eval to determine additional DME needs.  Referral made to [facility] with [facility]. Will need HH orders.                 Expected Discharge Plan: [place] Barriers to Discharge: Continued Medical Work up   Patient Goals and CMS Choice Patient states their goals for this hospitalization and ongoing recovery are:: to get better   Choice offered to / list presented to : Patient  Expected Discharge Plan and Services Expected Discharge Plan: [place] In-house Referral: Clinical Social Work   Post Acute Care Choice: [place] arrangements for the past 2 months: [place]: PT, OT HH Agency: [place] Date [place]: 06/13/20 Time HH Agency Contacted: 6 Representative spoke with at [place]: cheryl  Prior Living Arrangements/Services Living arrangements for the past 2 months: [place] Lives with:: Adult Children Patient language and need for interpreter reviewed:: Yes Do you feel safe going back to the place where you live?: Yes      Need for Family Participation in Patient Care: Yes (Comment) Care giver support system in place?: Yes (comment)   Criminal Activity/Legal Involvement Pertinent to Current Situation/Hospitalization: No - Comment as needed  Activities of Daily Living Home Assistive Devices/Equipment: Walker (specify type), Cane (specify quad or straight) ADL Screening (condition  at time of admission) Patient's cognitive ability adequate to safely complete daily activities?: No Is the patient deaf or have difficulty hearing?: No Does the patient have difficulty seeing, even when wearing glasses/contacts?: No Does the patient have difficulty concentrating, remembering, or making decisions?: Yes Patient able to express need for assistance with ADLs?: Yes Does the patient have difficulty dressing or bathing?: Yes Independently performs ADLs?: No Communication: Needs assistance Does the patient have difficulty walking or climbing stairs?: Yes Weakness of Legs: Both Weakness of Arms/Hands: Both  Permission Sought/Granted Permission sought to share information with : Family Supports    Share Information with NAME: [name]  Permission granted to share info w AGENCY: [facility]  Permission granted to share info w Relationship: daughter     Emotional Assessment Appearance:: Appears stated age Attitude/Demeanor/Rapport: Engaged Affect (typically observed): Accepting, Appropriate Orientation: : Oriented to Self, Oriented to Place, Oriented to Situation Alcohol / Substance Use: Not Applicable Psych Involvement: No (comment)  Admission diagnosis:  Dehydration [E86.0] Hyperkalemia [E87.5] Lactic acidosis [E87.2] Normocytic anemia [D64.9] Prerenal azotemia [R79.89] Generalized weakness [R53.1] AKI (acute kidney injury) ([place]) [N17.9] Patient Active Problem List   Diagnosis Date Noted  . AKI (acute kidney injury) ([place]) 03/06/2020  . Hyperkalemia 03/06/2020  . Acute metabolic encephalopathy 35/36/1443  . Acute respiratory failure with hypoxia ([place]) 02/06/2020  . OSA (obstructive sleep apnea) 02/06/2020  . CPAP ventilation treatment not tolerated 02/06/2020  . Morbid obesity with  BMI of 45.0-49.9, adult ([place]) 02/06/2020  . Lactic acidosis 02/06/2020  . possible Sepsis ([place]) 02/06/2020  . Polypharmacy 02/06/2020  . CAD (coronary artery disease) 05/05/2018  .  Microscopic hematuria 09/17/2016  . Cystitis, chronic 09/17/2016  . Adrenal cortical nodule ([place]) LEFT, small 09/17/2016  . Closed fracture of metatarsal bone 07/22/2016  . Plantar fasciitis, left 05/28/2016  . Degenerative spondylolisthesis 01/02/2016  . Tachycardia 01/02/2016  . Diabetes mellitus with complication ([place])   . Essential hypertension   . Non-toxic multinodular goiter 10/11/2014  . Diabetes mellitus ([place]) 10/11/2014  . Hyperlipidemia 10/11/2014  . Anxiety and depression 10/11/2014  . Osteoporosis 10/11/2014  . GERD (gastroesophageal reflux disease) 10/11/2014  . Arthritis 10/11/2014  . Cystocele 10/11/2014  . Chronic pain syndrome 10/11/2014  . Amnesia 10/11/2014  . Back pain 10/11/2014   PCP:  System, Pcp Not In Pharmacy:   [id number] - [place], [place] - [address] [place] [address] Phone: [phone] Fax: [phone]     Social Determinants of Health (SDOH) Interventions    Readmission Risk Interventions Readmission Risk Prevention Plan 03/07/2020  Transportation Screening Complete  PCP or Specialist Appt within 3-5 Days Complete  HRI or [place] Complete  Social Work Consult for [place] Planning/Counseling Complete  Palliative Care Screening Not Applicable  Medication Review Press photographer) Complete  Some recent data might be hidden

## 2020-06-13 NOTE — Consult Note (Signed)
[facility]  CONSULT NOTE    Date: 06/13/2020                  Patient Name:  [name]  MRN: [id number]  DOB: 24-Apr-1946  Age / Sex: 73 y.o., female         PCP: System, Pcp Not In                 Service Requesting Consult: Dr. Mal Misty                 Reason for Consult: Acute renal failure            History of Present Illness: [name] was admitted to [facility] for a fall. Patient states that patient has been having hallucinations. Brought to the ED with acute renal failure with metabolic acidosis and hyperkalemia.   Started on IV fluids and renal function has improved.    Medications: Outpatient medications: Medications Prior to Admission  Medication Sig Dispense Refill Last Dose  . aspirin EC 81 MG tablet Take 81 mg by mouth daily.   06/11/2020 at Unknown time  . atorvastatin (LIPITOR) 40 MG tablet Take 40 mg by mouth at bedtime.   06/11/2020 at Unknown time  . busPIRone (BUSPAR) 30 MG tablet Take 30 mg by mouth 2 (two) times daily.   06/11/2020 at Unknown time  . Cholecalciferol (VITAMIN D3) 1000 units CAPS Take 1,000 Units by mouth daily.    06/11/2020 at Unknown time  . DULoxetine (CYMBALTA) 60 MG capsule Take 60 mg by mouth daily.   06/11/2020 at Unknown time  . fluticasone (FLONASE) 50 MCG/ACT nasal spray Place 2 sprays into both nostrils daily as needed for allergies or rhinitis.    06/11/2020 at Unknown time  . gabapentin (NEURONTIN) 300 MG capsule Take 1 capsule (300 mg total) by mouth 3 (three) times daily. 90 capsule 0 06/11/2020 at 2100  . LANTUS SOLOSTAR 100 UNIT/ML Solostar Pen Inject 75 Units into the skin at bedtime.    06/11/2020 at 2100  . levothyroxine (SYNTHROID) 75 MCG tablet Take 1 tablet (75 mcg total) by mouth daily before breakfast. 30 tablet 1 06/11/2020 at 0900  . memantine (NAMENDA XR) 28 MG CP24 24 hr capsule Take 28 mg by mouth.   06/11/2020 at 0900  . metFORMIN (GLUCOPHAGE-XR) 500 MG 24 hr tablet Take 1,000 mg by mouth 2 (two)  times daily.   06/11/2020 at Unknown time  . metoprolol succinate (TOPROL-XL) 50 MG 24 hr tablet Take 50 mg by mouth 2 (two) times daily.    06/11/2020 at 2100  . oxyCODONE-acetaminophen (PERCOCET) 10-325 MG tablet Take 1 tablet by mouth every 6 (six) hours as needed for pain.   06/11/2020 at PRN  . pantoprazole (PROTONIX) 40 MG tablet Take 40 mg by mouth daily.   06/11/2020 at Unknown time  . QUEtiapine (SEROQUEL) 200 MG tablet Take 100-200 mg by mouth at bedtime.   06/11/2020 at 2100  . VIIBRYD 40 MG TABS Take by mouth.   06/11/2020 at Unknown time  . zolpidem (AMBIEN) 10 MG tablet Take 10 mg by mouth at bedtime as needed.   06/11/2020 at 2000    Current medications: Current Facility-Administered Medications  Medication Dose Route Frequency Provider Last Rate Last Admin  . acetaminophen (TYLENOL) tablet 650 mg  650 mg Oral Q6H PRN Agbata, Tochukwu, MD   650 mg at 06/12/20 2314   Or  . acetaminophen (TYLENOL) suppository 650 mg  650 mg Rectal Q6H PRN Agbata, Tochukwu, MD      . albuterol (PROVENTIL) (2.5 MG/3ML) 0.083% nebulizer solution  7.5 mg/hr Nebulization Once Agbata, Tochukwu, MD      . aspirin EC tablet 81 mg  81 mg Oral Daily Agbata, Tochukwu, MD   81 mg at 06/13/20 1107  . atorvastatin (LIPITOR) tablet 40 mg  40 mg Oral QHS Agbata, Tochukwu, MD   40 mg at 06/12/20 2313  . cholecalciferol (VITAMIN D3) tablet 1,000 Units  1,000 Units Oral Daily Agbata, Tochukwu, MD   1,000 Units at 06/13/20 1107  . clonazePAM (KLONOPIN) tablet 0.5 mg  0.5 mg Oral Daily Agbata, Tochukwu, MD   0.5 mg at 06/13/20 1108  . cyclobenzaprine (FLEXERIL) tablet 5 mg  5 mg Oral Daily Agbata, Tochukwu, MD   5 mg at 06/13/20 1108  . DULoxetine (CYMBALTA) DR capsule 60 mg  60 mg Oral Daily Agbata, Tochukwu, MD   60 mg at 06/13/20 1107  . enoxaparin (LOVENOX) injection 40 mg  40 mg Subcutaneous Q24H Oswald Hillock, [place]      . fluticasone (FLONASE) 50 MCG/ACT nasal spray 2 spray  2 spray Each Nare Daily PRN Agbata, Tochukwu, MD       . insulin aspart (novoLOG) injection 0-20 Units  0-20 Units Subcutaneous TID WC Agbata, Tochukwu, MD      . levothyroxine (SYNTHROID) tablet 75 mcg  75 mcg Oral Q0600 Agbata, Tochukwu, MD   75 mcg at 06/13/20 0626  . ondansetron (ZOFRAN) tablet 4 mg  4 mg Oral Q6H PRN Agbata, Tochukwu, MD       Or  . ondansetron (ZOFRAN) injection 4 mg  4 mg Intravenous Q6H PRN Agbata, Tochukwu, MD      . QUEtiapine (SEROQUEL) tablet 100 mg  100 mg Oral QHS Patel, Kishan S, RPH      . sodium bicarbonate 150 mEq in dextrose 5 % 1,000 mL infusion   Intravenous Continuous Jennye Boroughs, MD 75 mL/hr at 06/13/20 1122 New Bag at 06/13/20 1122  . topiramate (TOPAMAX) tablet 50 mg  50 mg Oral Daily Agbata, Tochukwu, MD   50 mg at 06/12/20 1751      Allergies: No Known Allergies    Past Medical History: Past Medical History:  Diagnosis Date  . Amnesia   . Anxiety   . Arthritis   . Back pain   . CHF (congestive heart failure) ([place])   . Chronic pain syndrome   . Cystocele   . Depression   . Diabetes mellitus without complication ([place])   . GERD (gastroesophageal reflux disease)   . Hyperlipidemia   . Hypertension   . IBS (irritable bowel syndrome)   . Myocardial infarction ([place])   . Obesity   . Osteoporosis   . Osteoporosis   . Shortness of breath dyspnea   . Sleep apnea    not using now  . Thyroid nodule      Past Surgical History: Past Surgical History:  Procedure Laterality Date  . ABDOMINAL HYSTERECTOMY    . APPENDECTOMY    . CARDIAC CATHETERIZATION    . CHOLECYSTECTOMY    . CORONARY STENT INTERVENTION N/A 05/05/2018   Procedure: CORONARY STENT INTERVENTION;  Surgeon: Isaias Cowman, MD;  Location: [place] [facility];  Service: Cardiovascular;  Laterality: N/A;  . FOOT SURGERY    . LEFT HEART CATH AND CORONARY ANGIOGRAPHY Left 05/05/2018   Procedure: LEFT HEART CATH AND CORONARY ANGIOGRAPHY;  Surgeon: Isaias Cowman, MD;  Location: [place]  [facility];  Service:  Cardiovascular;  Laterality: Left;  . OOPHORECTOMY    . REPLACEMENT TOTAL KNEE    . TONSILLECTOMY       Family History: Family History  Problem Relation Age of Onset  . Cancer Mother   . Cancer Father   . Cancer Sister   . Thyroid disease Neg Hx      Social History: Social History   Socioeconomic History  . Marital status: Widowed    Spouse name: Not on file  . Number of children: Not on file  . Years of education: Not on file  . Highest education level: Not on file  Occupational History  . Not on file  Tobacco Use  . Smoking status: Never Smoker  . Smokeless tobacco: Never Used  Vaping Use  . Vaping Use: Never used  Substance and Sexual Activity  . Alcohol use: No  . Drug use: Never  . Sexual activity: Not on file  Other Topics Concern  . Not on file  Social History Narrative  . Not on file   Social Determinants of Health   Financial Resource Strain:   . Difficulty of Paying Living Expenses:   Food Insecurity:   . Worried About Charity fundraiser in the Last Year:   . Arboriculturist in the Last Year:   Transportation Needs:   . Film/video editor (Medical):   Marland Kitchen Lack of Transportation (Non-Medical):   Physical Activity:   . Days of Exercise per Week:   . Minutes of Exercise per Session:   Stress:   . Feeling of Stress :   Social Connections:   . Frequency of Communication with Friends and Family:   . Frequency of Social Gatherings with Friends and Family:   . Attends Religious Services:   . Active Member of Clubs or Organizations:   . Attends Archivist Meetings:   Marland Kitchen Marital Status:   Intimate Partner Violence:   . Fear of Current or Ex-Partner:   . Emotionally Abused:   Marland Kitchen Physically Abused:   . Sexually Abused:      Review of Systems: Review of Systems  Constitutional: Negative.   HENT: Negative.   Eyes: Negative.   Respiratory: Positive for cough, hemoptysis, sputum production, shortness of breath and wheezing.    Cardiovascular: Positive for chest pain, palpitations, orthopnea, claudication, leg swelling and PND.  Gastrointestinal: Negative.   Genitourinary: Positive for dysuria, flank pain, frequency, hematuria and urgency.  Musculoskeletal: Negative for back pain, falls, joint pain, myalgias and neck pain.  Skin: Negative.   Neurological: Negative.   Endo/Heme/Allergies: Negative.   Psychiatric/Behavioral: Positive for hallucinations.    Vital Signs: Blood pressure 124/71, pulse (!) 101, temperature 98.1 F (36.7 C), temperature source Oral, resp. rate 17, height 5\' 5"  (1.651 m), weight 126.8 kg, SpO2 97 %.  Weight trends: Filed Weights   06/12/20 1150 06/12/20 2304 06/13/20 0511  Weight: 131.4 kg 127.7 kg 126.8 kg    Physical Exam: General: NAD,   Head: Normocephalic, atraumatic. Moist oral mucosal membranes  Eyes: Anicteric, PERRL  Neck: Supple, trachea midline  Lungs:  Clear to auscultation  Heart: Regular rate and rhythm  Abdomen:  Soft, nontender,   Extremities: no peripheral edema.  Neurologic: Nonfocal, moving all four extremities  Skin: No lesions        Lab results: Basic Metabolic Panel: Recent Labs  Lab 06/12/20 1149 06/13/20 0524  NA 138 140  K 5.9* 4.7  CL 109 107  CO2 17* 19*  GLUCOSE 111* 213*  BUN 101* 98*  CREATININE 4.50* 3.46*  CALCIUM 8.5* 9.0    Liver Function Tests: No results for input(s): AST, ALT, ALKPHOS, BILITOT, PROT, ALBUMIN in the last 168 hours. No results for input(s): LIPASE, AMYLASE in the last 168 hours. No results for input(s): AMMONIA in the last 168 hours.  CBC: Recent Labs  Lab 06/12/20 1149 06/13/20 0524  WBC 8.4 6.2  HGB 10.2* 10.4*  HCT 32.6* 33.5*  MCV 92.4 90.1  PLT 128* 131*    Cardiac Enzymes: Recent Labs  Lab 06/12/20 1905  CKTOTAL 387*    BNP: Invalid input(s): POCBNP  CBG: Recent Labs  Lab 06/12/20 1814 06/13/20 0750 06/13/20 1129  GLUCAP 167* 212* 242*    Microbiology: Results for  orders placed or performed during the hospital encounter of 06/12/20  SARS Coronavirus 2 by RT PCR (hospital order, performed in [facility] hospital lab) Nasopharyngeal Nasopharyngeal Swab     Status: None   Collection Time: 06/12/20 11:49 AM   Specimen: Nasopharyngeal Swab  Result Value Ref Range Status   SARS Coronavirus 2 NEGATIVE NEGATIVE Final    Comment: (NOTE) SARS-CoV-2 target nucleic acids are NOT DETECTED.  The SARS-CoV-2 RNA is generally detectable in upper and lower respiratory specimens during the acute phase of infection. The lowest concentration of SARS-CoV-2 viral copies this assay can detect is 250 copies / mL. A negative result does not preclude SARS-CoV-2 infection and should not be used as the sole basis for treatment or other patient management decisions.  A negative result may occur with improper specimen collection / handling, submission of specimen other than nasopharyngeal swab, presence of viral mutation(s) within the areas targeted by this assay, and inadequate number of viral copies (<250 copies / mL). A negative result must be combined with clinical observations, patient history, and epidemiological information.  Fact Sheet for Patients:   StrictlyIdeas.no  Fact Sheet for Healthcare Providers: BankingDealers.co.za  This test is not yet approved or  cleared by the [place] FDA and has been authorized for detection and/or diagnosis of SARS-CoV-2 by FDA under an Emergency Use Authorization (EUA).  This EUA will remain in effect (meaning this test can be used) for the duration of the COVID-19 declaration under Section 564(b)(1) of the Act, 21 U.S.C. section 360bbb-3(b)(1), unless the authorization is terminated or revoked sooner.  Performed at [facility], [address] [place], [place] [address]   Blood culture (routine x 2)     Status: None (Preliminary result)   Collection Time:  06/12/20  7:05 PM   Specimen: BLOOD  Result Value Ref Range Status   Specimen Description BLOOD LEFT HAND  Final   Special Requests   Final    BOTTLES DRAWN AEROBIC AND ANAEROBIC Blood Culture results may not be optimal due to an excessive volume of blood received in culture bottles   Culture   Final    NO GROWTH < 12 HOURS Performed at [facility], [address], [place],  [address]    Report Status PENDING  Incomplete  Blood culture (routine x 2)     Status: None (Preliminary result)   Collection Time: 06/12/20  7:16 PM   Specimen: BLOOD LEFT HAND  Result Value Ref Range Status   Specimen Description BLOOD LEFT HAND  Final   Special Requests   Final    BOTTLES DRAWN AEROBIC AND ANAEROBIC Blood Culture adequate volume   Culture   Final  NO GROWTH < 12 HOURS Performed at [facility], [address] [place], [place] [address]    Report Status PENDING  Incomplete    Coagulation Studies: No results for input(s): LABPROT, INR in the last 72 hours.  Urinalysis: Recent Labs    06/12/20 1149  COLORURINE YELLOW*  LABSPEC 1.013  PHURINE 5.0  GLUCOSEU NEGATIVE  HGBUR NEGATIVE  BILIRUBINUR NEGATIVE  KETONESUR NEGATIVE  PROTEINUR 100*  NITRITE NEGATIVE  LEUKOCYTESUR LARGE*      Imaging: CT Head Wo Contrast  Result Date: 06/12/2020 CLINICAL DATA:  Weakness and unable to walk for 2 days.  Neck pain. EXAM: CT HEAD WITHOUT CONTRAST CT CERVICAL SPINE WITHOUT CONTRAST TECHNIQUE: Multidetector CT imaging of the head and cervical spine was performed following the standard protocol without intravenous contrast. Multiplanar CT image reconstructions of the cervical spine were also generated. COMPARISON:  03/06/2020 FINDINGS: CT HEAD FINDINGS Brain: No evidence of acute infarction, hemorrhage, extra-axial collection, ventriculomegaly, or mass effect. Generalized cerebral atrophy. Periventricular white matter low attenuation likely secondary to microangiopathy.  Vascular: Cerebrovascular atherosclerotic calcifications are noted. Skull: Negative for fracture or focal lesion. Sinuses/Orbits: Visualized portions of the orbits are unremarkable. Visualized portions of the paranasal sinuses are unremarkable. Visualized portions of the mastoid air cells are unremarkable. Other: None. CT CERVICAL SPINE FINDINGS Alignment: Normal. Skull base and vertebrae: No acute fracture. No primary bone lesion or focal pathologic process. Soft tissues and spinal canal: No prevertebral fluid or swelling. No visible canal hematoma. Disc levels: Degenerative disease with disc height loss at C5-6. Mild bilateral facet arthropathy throughout the cervical spine most severe at C3-4 and C4-5 on the left. Upper chest: Lung apices are clear. Other: No fluid collection or hematoma. IMPRESSION: 1. No acute intracranial pathology. 2. No acute osseous injury of the cervical spine. 3. Cervical spine spondylosis as described above. Electronically Signed   By: Kathreen Devoid   On: 06/12/2020 13:15   CT Cervical Spine Wo Contrast  Result Date: 06/12/2020 CLINICAL DATA:  Weakness and unable to walk for 2 days.  Neck pain. EXAM: CT HEAD WITHOUT CONTRAST CT CERVICAL SPINE WITHOUT CONTRAST TECHNIQUE: Multidetector CT imaging of the head and cervical spine was performed following the standard protocol without intravenous contrast. Multiplanar CT image reconstructions of the cervical spine were also generated. COMPARISON:  03/06/2020 FINDINGS: CT HEAD FINDINGS Brain: No evidence of acute infarction, hemorrhage, extra-axial collection, ventriculomegaly, or mass effect. Generalized cerebral atrophy. Periventricular white matter low attenuation likely secondary to microangiopathy. Vascular: Cerebrovascular atherosclerotic calcifications are noted. Skull: Negative for fracture or focal lesion. Sinuses/Orbits: Visualized portions of the orbits are unremarkable. Visualized portions of the paranasal sinuses are unremarkable.  Visualized portions of the mastoid air cells are unremarkable. Other: None. CT CERVICAL SPINE FINDINGS Alignment: Normal. Skull base and vertebrae: No acute fracture. No primary bone lesion or focal pathologic process. Soft tissues and spinal canal: No prevertebral fluid or swelling. No visible canal hematoma. Disc levels: Degenerative disease with disc height loss at C5-6. Mild bilateral facet arthropathy throughout the cervical spine most severe at C3-4 and C4-5 on the left. Upper chest: Lung apices are clear. Other: No fluid collection or hematoma. IMPRESSION: 1. No acute intracranial pathology. 2. No acute osseous injury of the cervical spine. 3. Cervical spine spondylosis as described above. Electronically Signed   By: Kathreen Devoid   On: 06/12/2020 13:15   US RENAL  Result Date: 06/12/2020 CLINICAL DATA:  Acute kidney injury. EXAM: RENAL / URINARY TRACT ULTRASOUND COMPLETE COMPARISON:  Complete abdomen ultrasound dated 07/12/2008. Abdomen and pelvis CT dated 03/06/2020 FINDINGS: Right Kidney: Renal measurements: 11.0 x 7.1 x 6.0 = volume: 247 mL . Echogenicity within normal limits. No mass or hydronephrosis visualized. Left Kidney: Renal measurements: 12.1 x 6.4 x 5.6 cm = volume: 224 mL. Echogenicity within normal limits. No mass or hydronephrosis visualized. Bladder: Appears normal for degree of bladder distention. Other: None. IMPRESSION: Normal examination. Electronically Signed   By: Claudie Revering M.D.   On: 06/12/2020 16:33   DG Chest Portable 1 View  Result Date: 06/12/2020 CLINICAL DATA:  Weakness. EXAM: PORTABLE CHEST 1 VIEW COMPARISON:  03/06/2020 FINDINGS: Stable cardiomegaly. Low lung volumes. Central venous congestion. No focal infiltrate. No pneumothorax. IMPRESSION: Cardiomegaly and central venous congestion.  Low lung volumes. Electronically Signed   By: Suzy Bouchard M.D.   On: 06/12/2020 13:58      Assessment & Plan: Ms. ABIA MONACO is a 74 y.o. white female with sleep apnea,  dementia, coronary artery disease, osteoporosis, hypertension, hyperlipidemia, diabetes mellitus type II, congestive heart failure, depression, anxiety and hypothyroidism, who was admitted to [facility] on 06/12/2020 for Dehydration [E86.0] Hyperkalemia [E87.5] Lactic acidosis [E87.2] Normocytic anemia [D64.9] Prerenal azotemia [R79.89] Generalized weakness [R53.1] AKI (acute kidney injury) ([place]) [N17.9]  1. Acute renal failure with hyperkalemia and metabolic acidosis: baseline creatinine of 0.9, GFR of >60 on 04/22/20. History of proteinuria.  History consistent with prerenal azotemia.  No IV contrast exposure. Renal ultrasound with no obstruction No indication for dialysis.  Not on an ACE-/ARB as outpatient.  - Continue IV bicarbonate infusion.   2. Anemia with renal failure: hemoglobin 10.4. normocytic - Iron studies  3. Hypertension:  - metoprolol  4. 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 Hemoglobin A1c of 8.4%.  - holding metformin.   LOS: 1 [name] 8/10/202112:46 PM

## 2020-06-13 NOTE — Progress Notes (Addendum)
Progress Note    AYLANIE [name]  QPY:[id number] DOB: 12/18/1945  DOA: 06/12/2020 PCP: System, Pcp Not In      Brief Narrative:    Medical records reviewed and are as summarized below:  [name] is a 74 y.o. female with medical history significant for morbid obesity, diabetes mellitus, hypertension, depression, GERD and anxiety who was brought in by EMS after her family called because she fell at home and was unable to get up.  Her daughter also reported that patient was having visual hallucinations at home.  In the ED, she was hypotensive and work-up revealed acute kidney injury complicated by hyperkalemia.      Assessment/Plan:   Principal Problem:   AKI (acute kidney injury) ([place]) Active Problems:   Diabetes mellitus with complication ([place])   Essential hypertension   CAD (coronary artery disease)   Acute metabolic encephalopathy   Morbid obesity with BMI of 45.0-49.9, adult (HCC)   Lactic acidosis   Hyperkalemia   Acute kidney injury complicated by hyperkalemia and metabolic acidosis: Hyperkalemia has resolved.  Creatinine was 0.9 on 04/22/2020.  BUN and creatinine are stable significantly elevated.  Continue IV fluids (sodium bicarbonate infusion) and monitor BMP. Renal ultrasound did not show any evidence of obstruction.  Follow-up with nephrologist. Lasix and lisinopril have been held.  Abnormal urinalysis, probable UTI: Start IV Rocephin.  Follow-up urine cultures.  Acute metabolic encephalopathy: Supportive care  Anemia of chronic disease: H&H is stable.  Type II DM with hyperglycemia: Resume Lantus but decrease dose from 75 units to 25 units nightly.  Adjust dose of Lantus based on glucose levels.  Metformin on hold.  NovoLog as needed for hyperglycemia.  Hypertension: Lasix and lisinopril on hold  CAD s/p DES to proximal LAD July 2019: Continue aspirin and Lipitor  Anxiety and depression: Continue psychotropics  Falls at home: PT and OT  evaluation   Body mass index is 46.53 kg/m.  (Morbid obesity): This complicates overall care and prognosis.  Diet Order            Diet heart healthy/carb modified Room service appropriate? Yes; Fluid consistency: Thin  Diet effective now                       Medications:   . albuterol  7.5 mg/hr Nebulization Once  . aspirin EC  81 mg Oral Daily  . atorvastatin  40 mg Oral QHS  . cholecalciferol  1,000 Units Oral Daily  . clonazePAM  0.5 mg Oral Daily  . cyclobenzaprine  5 mg Oral Daily  . DULoxetine  60 mg Oral Daily  . enoxaparin (LOVENOX) injection  40 mg Subcutaneous Q24H  . insulin aspart  0-20 Units Subcutaneous TID WC  . insulin glargine  25 Units Subcutaneous QHS  . levothyroxine  75 mcg Oral Q0600  . QUEtiapine  100 mg Oral QHS  . topiramate  50 mg Oral Daily   Continuous Infusions: . cefTRIAXone (ROCEPHIN)  IV    .  sodium bicarbonate  infusion 1000 mL 75 mL/hr at 06/13/20 1122     Anti-infectives (From admission, onward)   Start     Dose/Rate Route Frequency Ordered Stop   06/13/20 1500  cefTRIAXone (ROCEPHIN) 1 g in sodium chloride 0.9 % 100 mL IVPB     Discontinue     1 g 200 mL/hr over 30 Minutes Intravenous Every 24 hours 06/13/20 1449  Family Communication/Anticipated D/C date and plan/Code Status   DVT prophylaxis: enoxaparin (LOVENOX) injection 40 mg Start: 06/13/20 2200     Code Status: DNR  Family Communication: Plan discussed with patient Disposition Plan:    Status is: Inpatient  Remains inpatient appropriate because:IV treatments appropriate due to intensity of illness or inability to take PO and Inpatient level of care appropriate due to severity of illness   Dispo: The patient is from: Home              Anticipated d/c is to: Home              Anticipated d/c date is: 3 days              Patient currently is not medically stable to d/c.           Subjective:   C/o multiple falls at home.   She is unable to provide an adequate history   Objective:    Vitals:   06/12/20 2304 06/13/20 0511 06/13/20 0749 06/13/20 1130  BP: (!) 159/77 (!) 129/96 (!) 162/82 124/71  Pulse: (!) 102 (!) 102 98 (!) 101  Resp:   18 17  Temp: 98.3 F (36.8 C) 98.3 F (36.8 C) 98 F (36.7 C) 98.1 F (36.7 C)  TempSrc: Oral Oral Oral Oral  SpO2: 99% 95% 96% 97%  Weight: 127.7 kg 126.8 kg    Height: 5\' 5"  (1.651 m)      No data found.   Intake/Output Summary (Last 24 hours) at 06/13/2020 1453 Last data filed at 06/13/2020 1345 Gross per 24 hour  Intake 1171.86 ml  Output 1450 ml  Net -278.14 ml   Filed Weights   06/12/20 1150 06/12/20 2304 06/13/20 0511  Weight: 131.4 kg 127.7 kg 126.8 kg    Exam:  GEN: NAD SKIN: Warm and dry EYES: EOMI ENT: MMM CV: RRR PULM: CTA B ABD: soft, obese, NT, +BS CNS: AAO x person and place, non focal EXT: No edema or tenderness   Data Reviewed:   I have personally reviewed following labs and imaging studies:  Labs: Labs show the following:   Basic Metabolic Panel: Recent Labs  Lab 06/12/20 1149 06/13/20 0524  NA 138 140  K 5.9* 4.7  CL 109 107  CO2 17* 19*  GLUCOSE 111* 213*  BUN 101* 98*  CREATININE 4.50* 3.46*  CALCIUM 8.5* 9.0   GFR Estimated Creatinine Clearance: 19.4 mL/min (A) (by C-G formula based on SCr of 3.46 mg/dL (H)). Liver Function Tests: No results for input(s): AST, ALT, ALKPHOS, BILITOT, PROT, ALBUMIN in the last 168 hours. No results for input(s): LIPASE, AMYLASE in the last 168 hours. No results for input(s): AMMONIA in the last 168 hours. Coagulation profile No results for input(s): INR, PROTIME in the last 168 hours.  CBC: Recent Labs  Lab 06/12/20 1149 06/13/20 0524  WBC 8.4 6.2  HGB 10.2* 10.4*  HCT 32.6* 33.5*  MCV 92.4 90.1  PLT 128* 131*   Cardiac Enzymes: Recent Labs  Lab 06/12/20 1905  CKTOTAL 387*   BNP (last 3 results) No results for input(s): PROBNP in the last 8760  hours. CBG: Recent Labs  Lab 06/12/20 1814 06/13/20 0750 06/13/20 1129  GLUCAP 167* 212* 242*   D-Dimer: No results for input(s): DDIMER in the last 72 hours. Hgb A1c: Recent Labs    06/12/20 1905  HGBA1C 8.4*   Lipid Profile: No results for input(s): CHOL, HDL, LDLCALC, TRIG, CHOLHDL, LDLDIRECT in the  last 72 hours. Thyroid function studies: No results for input(s): TSH, T4TOTAL, T3FREE, THYROIDAB in the last 72 hours.  Invalid input(s): FREET3 Anemia work up: No results for input(s): VITAMINB12, FOLATE, FERRITIN, TIBC, IRON, RETICCTPCT in the last 72 hours. Sepsis Labs: Recent Labs  Lab 06/12/20 1149 06/13/20 0524  WBC 8.4 6.2  LATICACIDVEN 2.9*  --     Microbiology Recent Results (from the past 240 hour(s))  SARS Coronavirus 2 by RT PCR (hospital order, performed in [facility] hospital lab) Nasopharyngeal Nasopharyngeal Swab     Status: None   Collection Time: 06/12/20 11:49 AM   Specimen: Nasopharyngeal Swab  Result Value Ref Range Status   SARS Coronavirus 2 NEGATIVE NEGATIVE Final    Comment: (NOTE) SARS-CoV-2 target nucleic acids are NOT DETECTED.  The SARS-CoV-2 RNA is generally detectable in upper and lower respiratory specimens during the acute phase of infection. The lowest concentration of SARS-CoV-2 viral copies this assay can detect is 250 copies / mL. A negative result does not preclude SARS-CoV-2 infection and should not be used as the sole basis for treatment or other patient management decisions.  A negative result may occur with improper specimen collection / handling, submission of specimen other than nasopharyngeal swab, presence of viral mutation(s) within the areas targeted by this assay, and inadequate number of viral copies (<250 copies / mL). A negative result must be combined with clinical observations, patient history, and epidemiological information.  Fact Sheet for Patients:   StrictlyIdeas.no  Fact  Sheet for Healthcare Providers: BankingDealers.co.za  This test is not yet approved or  cleared by the [place] FDA and has been authorized for detection and/or diagnosis of SARS-CoV-2 by FDA under an Emergency Use Authorization (EUA).  This EUA will remain in effect (meaning this test can be used) for the duration of the COVID-19 declaration under Section 564(b)(1) of the Act, 21 U.S.C. section 360bbb-3(b)(1), unless the authorization is terminated or revoked sooner.  Performed at [facility], [address] [place], [place] [address]   Blood culture (routine x 2)     Status: None (Preliminary result)   Collection Time: 06/12/20  7:05 PM   Specimen: BLOOD  Result Value Ref Range Status   Specimen Description BLOOD LEFT HAND  Final   Special Requests   Final    BOTTLES DRAWN AEROBIC AND ANAEROBIC Blood Culture results may not be optimal due to an excessive volume of blood received in culture bottles   Culture   Final    NO GROWTH < 12 HOURS Performed at [facility], [address] [place], [place] [address]    Report Status PENDING  Incomplete  Blood culture (routine x 2)     Status: None (Preliminary result)   Collection Time: 06/12/20  7:16 PM   Specimen: BLOOD LEFT HAND  Result Value Ref Range Status   Specimen Description BLOOD LEFT HAND  Final   Special Requests   Final    BOTTLES DRAWN AEROBIC AND ANAEROBIC Blood Culture adequate volume   Culture   Final    NO GROWTH < 12 HOURS Performed at [facility], [address], [place], [place] [address]    Report Status PENDING  Incomplete    Procedures and diagnostic studies:  CT Head Wo Contrast  Result Date: 06/12/2020 CLINICAL DATA:  Weakness and unable to walk for 2 days.  Neck pain. EXAM: CT HEAD WITHOUT CONTRAST CT CERVICAL SPINE WITHOUT CONTRAST TECHNIQUE: Multidetector CT imaging of the head and  cervical spine was performed following the standard protocol  without intravenous contrast. Multiplanar CT image reconstructions of the cervical spine were also generated. COMPARISON:  03/06/2020 FINDINGS: CT HEAD FINDINGS Brain: No evidence of acute infarction, hemorrhage, extra-axial collection, ventriculomegaly, or mass effect. Generalized cerebral atrophy. Periventricular white matter low attenuation likely secondary to microangiopathy. Vascular: Cerebrovascular atherosclerotic calcifications are noted. Skull: Negative for fracture or focal lesion. Sinuses/Orbits: Visualized portions of the orbits are unremarkable. Visualized portions of the paranasal sinuses are unremarkable. Visualized portions of the mastoid air cells are unremarkable. Other: None. CT CERVICAL SPINE FINDINGS Alignment: Normal. Skull base and vertebrae: No acute fracture. No primary bone lesion or focal pathologic process. Soft tissues and spinal canal: No prevertebral fluid or swelling. No visible canal hematoma. Disc levels: Degenerative disease with disc height loss at C5-6. Mild bilateral facet arthropathy throughout the cervical spine most severe at C3-4 and C4-5 on the left. Upper chest: Lung apices are clear. Other: No fluid collection or hematoma. IMPRESSION: 1. No acute intracranial pathology. 2. No acute osseous injury of the cervical spine. 3. Cervical spine spondylosis as described above. Electronically Signed   By: Kathreen Devoid   On: 06/12/2020 13:15   CT Cervical Spine Wo Contrast  Result Date: 06/12/2020 CLINICAL DATA:  Weakness and unable to walk for 2 days.  Neck pain. EXAM: CT HEAD WITHOUT CONTRAST CT CERVICAL SPINE WITHOUT CONTRAST TECHNIQUE: Multidetector CT imaging of the head and cervical spine was performed following the standard protocol without intravenous contrast. Multiplanar CT image reconstructions of the cervical spine were also generated. COMPARISON:  03/06/2020 FINDINGS: CT HEAD FINDINGS Brain: No evidence of acute infarction, hemorrhage, extra-axial collection,  ventriculomegaly, or mass effect. Generalized cerebral atrophy. Periventricular white matter low attenuation likely secondary to microangiopathy. Vascular: Cerebrovascular atherosclerotic calcifications are noted. Skull: Negative for fracture or focal lesion. Sinuses/Orbits: Visualized portions of the orbits are unremarkable. Visualized portions of the paranasal sinuses are unremarkable. Visualized portions of the mastoid air cells are unremarkable. Other: None. CT CERVICAL SPINE FINDINGS Alignment: Normal. Skull base and vertebrae: No acute fracture. No primary bone lesion or focal pathologic process. Soft tissues and spinal canal: No prevertebral fluid or swelling. No visible canal hematoma. Disc levels: Degenerative disease with disc height loss at C5-6. Mild bilateral facet arthropathy throughout the cervical spine most severe at C3-4 and C4-5 on the left. Upper chest: Lung apices are clear. Other: No fluid collection or hematoma. IMPRESSION: 1. No acute intracranial pathology. 2. No acute osseous injury of the cervical spine. 3. Cervical spine spondylosis as described above. Electronically Signed   By: Kathreen Devoid   On: 06/12/2020 13:15   US RENAL  Result Date: 06/12/2020 CLINICAL DATA:  Acute kidney injury. EXAM: RENAL / URINARY TRACT ULTRASOUND COMPLETE COMPARISON:  Complete abdomen ultrasound dated 07/12/2008. Abdomen and pelvis CT dated 03/06/2020 FINDINGS: Right Kidney: Renal measurements: 11.0 x 7.1 x 6.0 = volume: 247 mL . Echogenicity within normal limits. No mass or hydronephrosis visualized. Left Kidney: Renal measurements: 12.1 x 6.4 x 5.6 cm = volume: 224 mL. Echogenicity within normal limits. No mass or hydronephrosis visualized. Bladder: Appears normal for degree of bladder distention. Other: None. IMPRESSION: Normal examination. Electronically Signed   By: Claudie Revering M.D.   On: 06/12/2020 16:33   DG Chest Portable 1 View  Result Date: 06/12/2020 CLINICAL DATA:  Weakness. EXAM: PORTABLE  CHEST 1 VIEW COMPARISON:  03/06/2020 FINDINGS: Stable cardiomegaly. Low lung volumes. Central venous congestion. No focal infiltrate. No pneumothorax. IMPRESSION: Cardiomegaly and  central venous congestion.  Low lung volumes. Electronically Signed   By: Suzy Bouchard M.D.   On: 06/12/2020 13:58               LOS: 1 day   [facility]   Pager [phone]. If 7PM-7AM, please contact night-coverage at [email or web address]     06/13/2020, 2:53 PM

## 2020-06-14 LAB — GLUCOSE, CAPILLARY
Glucose-Capillary: 251 mg/dL — ABNORMAL HIGH (ref 70–99)
Glucose-Capillary: 257 mg/dL — ABNORMAL HIGH (ref 70–99)
Glucose-Capillary: 260 mg/dL — ABNORMAL HIGH (ref 70–99)
Glucose-Capillary: 289 mg/dL — ABNORMAL HIGH (ref 70–99)

## 2020-06-14 LAB — CBC WITH DIFFERENTIAL/PLATELET
Abs Immature Granulocytes: 0.06 10*3/uL (ref 0.00–0.07)
Basophils Absolute: 0.1 10*3/uL (ref 0.0–0.1)
Basophils Relative: 1 %
Eosinophils Absolute: 0.3 10*3/uL (ref 0.0–0.5)
Eosinophils Relative: 5 %
HCT: 32.6 % — ABNORMAL LOW (ref 36.0–46.0)
Hemoglobin: 10.5 g/dL — ABNORMAL LOW (ref 12.0–15.0)
Immature Granulocytes: 1 %
Lymphocytes Relative: 19 %
Lymphs Abs: 1.2 10*3/uL (ref 0.7–4.0)
MCH: 28.3 pg (ref 26.0–34.0)
MCHC: 32.2 g/dL (ref 30.0–36.0)
MCV: 87.9 fL (ref 80.0–100.0)
Monocytes Absolute: 0.6 10*3/uL (ref 0.1–1.0)
Monocytes Relative: 10 %
Neutro Abs: 4 10*3/uL (ref 1.7–7.7)
Neutrophils Relative %: 64 %
Platelets: 142 10*3/uL — ABNORMAL LOW (ref 150–400)
RBC: 3.71 MIL/uL — ABNORMAL LOW (ref 3.87–5.11)
RDW: 15.9 % — ABNORMAL HIGH (ref 11.5–15.5)
WBC: 6.3 10*3/uL (ref 4.0–10.5)
nRBC: 0 % (ref 0.0–0.2)

## 2020-06-14 LAB — RENAL FUNCTION PANEL
Albumin: 4 g/dL (ref 3.5–5.0)
Anion gap: 11 (ref 5–15)
BUN: 60 mg/dL — ABNORMAL HIGH (ref 8–23)
CO2: 25 mmol/L (ref 22–32)
Calcium: 9.2 mg/dL (ref 8.9–10.3)
Chloride: 108 mmol/L (ref 98–111)
Creatinine, Ser: 1.7 mg/dL — ABNORMAL HIGH (ref 0.44–1.00)
GFR calc Af Amer: 34 mL/min — ABNORMAL LOW (ref >60)
GFR calc non Af Amer: 29 mL/min — ABNORMAL LOW (ref >60)
Glucose, Bld: 237 mg/dL — ABNORMAL HIGH (ref 70–99)
Phosphorus: 2.9 mg/dL (ref 2.5–4.6)
Potassium: 4.5 mmol/L (ref 3.5–5.1)
Sodium: 144 mmol/L (ref 135–145)

## 2020-06-14 MED ORDER — LACTATED RINGERS IV SOLN: INTRAVENOUS | Status: DC

## 2020-06-14 MED ORDER — OXYCODONE HCL 5 MG PO TABS: 5.0000 mg | ORAL_TABLET | Freq: Four times a day (QID) | ORAL | Status: DC | PRN

## 2020-06-14 MED ORDER — MEMANTINE HCL ER 28 MG PO CP24
28.0000 mg | ORAL_CAPSULE | Freq: Every day | ORAL | Status: DC
  Administered 2020-06-14 – 2020-06-16 (×3): 28 mg via ORAL
  Filled 2020-06-14 (×3): qty 1

## 2020-06-14 MED ORDER — OXYCODONE-ACETAMINOPHEN 10-325 MG PO TABS: 1.0000 | ORAL_TABLET | Freq: Four times a day (QID) | ORAL | Status: DC | PRN

## 2020-06-14 MED ORDER — ENOXAPARIN SODIUM 40 MG/0.4ML ~~LOC~~ SOLN
40.0000 mg | Freq: Two times a day (BID) | SUBCUTANEOUS | Status: DC
  Administered 2020-06-14 – 2020-06-16 (×4): 40 mg via SUBCUTANEOUS
  Filled 2020-06-14 (×4): qty 0.4

## 2020-06-14 MED ORDER — BUSPIRONE HCL 15 MG PO TABS
30.0000 mg | ORAL_TABLET | Freq: Two times a day (BID) | ORAL | Status: DC
  Administered 2020-06-14 – 2020-06-16 (×5): 30 mg via ORAL
  Filled 2020-06-14 (×6): qty 2

## 2020-06-14 MED ORDER — SODIUM CHLORIDE 0.9 % IV SOLN
1.0000 g | Freq: Two times a day (BID) | INTRAVENOUS | Status: DC
  Administered 2020-06-14 (×2): 1 g via INTRAVENOUS
  Filled 2020-06-14 (×3): qty 1

## 2020-06-14 MED ORDER — INSULIN GLARGINE 100 UNIT/ML ~~LOC~~ SOLN
30.0000 [IU] | Freq: Every day | SUBCUTANEOUS | Status: DC
  Administered 2020-06-14 – 2020-06-15 (×2): 30 [IU] via SUBCUTANEOUS
  Filled 2020-06-14 (×3): qty 0.3

## 2020-06-14 MED ORDER — OXYCODONE-ACETAMINOPHEN 5-325 MG PO TABS
1.0000 | ORAL_TABLET | Freq: Four times a day (QID) | ORAL | Status: DC | PRN
  Administered 2020-06-14 – 2020-06-15 (×3): 1 via ORAL
  Filled 2020-06-14 (×3): qty 1

## 2020-06-14 NOTE — Evaluation (Signed)
Physical Therapy Evaluation Patient Details Name: [name] MRN: [id number] DOB: 29-Jan-1946 Today's Date: 06/14/2020   History of Present Illness  Pt admitted for AKI with fall and inability to get up and AMS. History includes CAD, depression, Chronic pain, DM, HTN, and HLD.  Clinical Impression  Patient received in bed, initially declining PT reporting she is tired. She then requested assistance with bed pain. With encouragement patient agrees to get up to [facility]. Patient performed stand pivot to [facility] and then back to bed with min assist. She required assistance for peri care. She will continue to benefit from skilled PT while here to improve functional mobility and safety with mobility for return home with assistance from family.        Follow Up Recommendations Home health PT    Equipment Recommendations  None recommended by PT    Recommendations for Other Services       Precautions / Restrictions Precautions Precautions: Fall Restrictions Weight Bearing Restrictions: No      Mobility  Bed Mobility Overal bed mobility: Needs Assistance Bed Mobility: Supine to Sit;Sit to Supine Rolling: Min guard   Supine to sit: Min guard Sit to supine: Min guard   General bed mobility comments: with use of bed rails and min cuing for technique and hand placement  Transfers Overall transfer level: Needs assistance Equipment used: None Transfers: Stand Pivot Transfers;Sit to/from Stand Sit to Stand: Min assist Stand pivot transfers: Min assist       General transfer comment: min A overall with min cuing for hand placement and technique  Ambulation/Gait Ambulation/Gait assistance: Min assist Gait Distance (Feet): 6 Feet Assistive device: None Gait Pattern/deviations: Step-to pattern;Decreased step length - right;Decreased step length - left;Trunk flexed Gait velocity: decr   General Gait Details: patient initially declined PT, but then asked for me to get her bedpan. Assisted  patient to [facility] and back to bed. Patient requires B UE assist during transfer with min assist. Cues for safe hand placement.  Stairs            Wheelchair Mobility    Modified Rankin (Stroke Patients Only)       Balance Overall balance assessment: Needs assistance Sitting-balance support: Feet supported;Single extremity supported Sitting balance-Leahy Scale: Fair Sitting balance - Comments: supervision for safety   Standing balance support: Bilateral upper extremity supported;During functional activity Standing balance-Leahy Scale: Fair Standing balance comment: reliance on UE support                             Pertinent Vitals/Pain Pain Assessment: Faces Faces Pain Scale: No hurt    Home Living Family/patient expects to be discharged to:: Private residence Living Arrangements: Children Available Help at Discharge: Available 24 hours/day Type of Home: Apartment Home Access: Ramped entrance     Home Layout: One level Home Equipment: Walker - 4 wheels Additional Comments: Pt reports taking sink bath with daughter's assistance    Prior Function Level of Independence: Independent with assistive device(s)               Hand Dominance   Dominant Hand: Right    Extremity/Trunk Assessment   Upper Extremity Assessment Upper Extremity Assessment: Generalized weakness    Lower Extremity Assessment Lower Extremity Assessment: Generalized weakness    Cervical / Trunk Assessment Cervical / Trunk Assessment: Normal  Communication   Communication: No difficulties  Cognition Arousal/Alertness: Lethargic Behavior During Therapy: Flat affect Overall Cognitive Status: No  family/caregiver present to determine baseline cognitive functioning                                 General Comments: Pt oriented to self and location only. Pt needing max cuing for orientation to time and situation.      General Comments      Exercises      Assessment/Plan    PT Assessment Patient needs continued PT services  PT Problem List Decreased mobility;Decreased strength;Decreased activity tolerance;Decreased balance;Decreased cognition;Decreased safety awareness;Obesity       PT Treatment Interventions Therapeutic activities;Therapeutic exercise;Gait training;Stair training;Functional mobility training;Balance training;Patient/family education    PT Goals (Current goals can be found in the Care Plan section)  Acute Rehab PT Goals Patient Stated Goal: to go home PT Goal Formulation: With patient Time For Goal Achievement: 06/28/20 Potential to Achieve Goals: Good    Frequency Min 2X/week   Barriers to discharge        Co-evaluation               AM-PAC PT "6 Clicks" Mobility  Outcome Measure Help needed turning from your back to your side while in a flat bed without using bedrails?: A Little Help needed moving from lying on your back to sitting on the side of a flat bed without using bedrails?: A Little Help needed moving to and from a bed to a chair (including a wheelchair)?: A Little Help needed standing up from a chair using your arms (e.g., wheelchair or bedside chair)?: A Little Help needed to walk in hospital room?: A Lot Help needed climbing 3-5 steps with a railing? : A Lot 6 Click Score: 16    End of Session Equipment Utilized During Treatment: Gait belt Activity Tolerance: Patient tolerated treatment well Patient left: in bed;with bed alarm set;with call bell/phone within reach Nurse Communication: Mobility status;Other (comment) (called NT to replace pure wick) PT Visit Diagnosis: Unsteadiness on feet (R26.81);History of falling (Z91.81);Muscle weakness (generalized) (M62.81);Difficulty in walking, not elsewhere classified (R26.2)    Time: 3235-5732 PT Time Calculation (min) (ACUTE ONLY): 18 min   Charges:   PT Evaluation $PT Eval Moderate Complexity: 1 Mod PT Treatments $Therapeutic  Activity: 8-22 mins        Cora Brierley, PT, GCS 06/14/20,12:48 PM

## 2020-06-14 NOTE — Progress Notes (Signed)
PROGRESS NOTE  SHANTAI TIEDEMAN HQI:[id number] DOB: [birth year] DOA: 06/12/2020 PCP: System, Pcp Not In  Brief History   [name] is a 74 y.o. female with medical history significant formorbid obesity,diabetes mellitus, hypertension, depression, GERD and anxiety who was brought in by EMS after her family called because she fell at home and was unable to get up. Her daughter also reported that patient was having visual hallucinations at home.  In the ED, she was hypotensive and work-up revealed acute kidney injury complicated by hyperkalemia.  PT has stated that the patient is at high risk for falls and has recommended SNF. The patient is refusing.  Consultants  . nephrology  Procedures  . None  Antibiotics   Anti-infectives (From admission, onward)   Start     Dose/Rate Route Frequency Ordered Stop   06/14/20 1200  meropenem (MERREM) 1 g in sodium chloride 0.9 % 100 mL IVPB     Discontinue     1 g 200 mL/hr over 30 Minutes Intravenous Every 12 hours 06/14/20 1136     06/13/20 1500  cefTRIAXone (ROCEPHIN) 1 g in sodium chloride 0.9 % 100 mL IVPB  Status:  Discontinued        1 g 200 mL/hr over 30 Minutes Intravenous Every 24 hours 06/13/20 1449 06/14/20 1136    .   Subjective  The patient is resting in bed. No new complaints. Patient had been combative and delirious earlier today. According to nursing she is intermittently confused. This morning she told her nurse that she was busy painting her bedroom. Family is at bedside and supporting her assertion to go home.  Objective   Vitals:  Vitals:   06/14/20 1141 06/14/20 1651  BP: (!) 146/76 (!) 156/76  Pulse: (!) 107 (!) 102  Resp: 19 19  Temp: 98.5 F (36.9 C) 98.4 F (36.9 C)  SpO2: 92% 98%   Exam:  Constitutional:  . The patient is awake, alert, mildly confused. No acute distress. Respiratory:  . No increased work of breathing. . No wheezes, rales, or rhonchi . No tactile fremitus Cardiovascular:  . Regular  rate and rhythm . No murmurs, ectopy, or gallups. . No lateral PMI. No thrills. Abdomen:  . Abdomen is soft, non-tender, non-distended . No hernias, masses, or organomegaly . Normoactive bowel sounds.  Musculoskeletal:  . No cyanosis, clubbing, or edema Skin:  . No rashes, lesions, ulcers . palpation of skin: no induration or nodules Neurologic:  . CN 2-12 intact . Sensation all 4 extremities intact Psychiatric:  . Mental status o Mood, affect appropriate o Orientation to person, place, time  . judgment and insight appear impaired.  I have personally reviewed the following:   Today's Data  . Vitals, BMP, CBC  Micro Data  Urine culture has grown out 100K colonies of Klebsiella pneumonia. Sensitivities pending. The patient is receiving meropenem.  Scheduled Meds: . albuterol  7.5 mg/hr Nebulization Once  . aspirin EC  81 mg Oral Daily  . atorvastatin  40 mg Oral QHS  . busPIRone  30 mg Oral BID  . cholecalciferol  1,000 Units Oral Daily  . clonazePAM  0.5 mg Oral Daily  . cyclobenzaprine  5 mg Oral Daily  . DULoxetine  60 mg Oral Daily  . enoxaparin (LOVENOX) injection  40 mg Subcutaneous Q12H  . insulin aspart  0-20 Units Subcutaneous TID WC  . insulin glargine  30 Units Subcutaneous QHS  . levothyroxine  75 mcg Oral Q0600  . memantine  28 mg Oral Daily  . QUEtiapine  100 mg Oral QHS  . topiramate  50 mg Oral Daily   Continuous Infusions: . lactated ringers 75 mL/hr at 06/14/20 1537  . meropenem (MERREM) IV Stopped (06/14/20 1500)    Principal Problem:   AKI (acute kidney injury) ([place]) Active Problems:   Diabetes mellitus with complication (HCC)   Essential hypertension   CAD (coronary artery disease)   Acute metabolic encephalopathy   Morbid obesity with BMI of 45.0-49.9, adult (HCC)   Lactic acidosis   Hyperkalemia   LOS: 2 days   A & P  Acute kidney injury complicated by hyperkalemia and metabolic acidosis: Hyperkalemia has resolved.  Creatinine was  0.9 on 04/22/2020.  Improved from 3.46 on admission to 1.7 today. BUN and creatinine are stable significantly elevated.  Continue IV fluids (sodium bicarbonate infusion) and monitor BMP. Renal ultrasound did not show any evidence of obstruction.  Follow-up with nephrologist. Lasix and lisinopril have been held.  Klebsiella UTI: the patient has been changed to meropenem. Sensitivities on urine culture are pending.  Acute metabolic encephalopathy: Supportive care  Anemia of chronic disease: H&H is stable.  Type II DM with hyperglycemia: Resume Lantus but decrease dose from 75 units to 25 units nightly.  Adjust dose of Lantus based on glucose levels.  Metformin on hold.  NovoLog as needed for hyperglycemia.  Hypertension: Lasix and lisinopril on hold  CAD s/p DES to proximal LAD July 2019: Continue aspirin and Lipitor  Anxiety and depression: Continue psychotropics  Falls at home: PT and OT evaluation/ Pt is refusing rehab. She admits that the person who will be home with her will not be able to get her up or help her if she falls, but she states that someone else will come to help.  I have seen and examined this patient myself. I have spent 32 minutes in her evaluation and care.  DVT prophylaxis: Lovenox. CODE STATUS: DNR Family Communication: Family at bedside Disposition:  Status is: Inpatient  Remains inpatient appropriate because:IV treatments appropriate due to intensity of illness or inability to take PO   Dispo: The patient is from: Home              Anticipated d/c is to: Home              Anticipated d/c date is: 2 days.              Patient currently is not medically stable to d/c.  Jett Kulzer, DO [facility] Direct contact: see [email or web address]  7PM-7AM contact night coverage as above 06/14/2020, 7:18 PM  LOS: 2 days

## 2020-06-14 NOTE — Progress Notes (Signed)
Combative and belligerent and screaming. Trying to get out of bed. Notified daughter and she spoke with patient. Presently calm and cooperative.

## 2020-06-14 NOTE — Evaluation (Signed)
Occupational Therapy Evaluation Patient Details Name: [name] MRN: [id number] DOB: 27-May-1946 Today's Date: 06/14/2020    History of Present Illness Pt admitted for AKI with fall and inability to get up and AMS. History includes CAD, depression, Chronic pain, DM, HTN, and HLD.   Clinical Impression    Patient presenting with decreased I in self care, balance, functional mobility/transfers, endurance, safety awareness, cognition, and strength. Patient reports being occasionally needing assist with bathing from daughter PTA. She ambulates with RW and housemate assists with IADL tasks.  Patient currently functioning at set up A for self care tasks and mod A for LB self care. Min A for functional transfers with use of RW. Patient will benefit from acute OT to increase overall independence in the areas of ADLs, functional mobility, and safety awareness in order to safely discharge home with caregiver.  Follow Up Recommendations  Home health OT;Supervision/Assistance - 24 hour    Equipment Recommendations  3 in 1 bedside commode    Recommendations for Other Services Other (comment) (none at this time)     Precautions / Restrictions Precautions Precautions: Fall Restrictions Weight Bearing Restrictions: No      Mobility Bed Mobility Overal bed mobility: Needs Assistance Bed Mobility: Rolling;Supine to Sit;Sit to Supine Rolling: Min guard   Supine to sit: Min guard Sit to supine: Mod assist   General bed mobility comments: with use of bed rails and min cuing for technique and hand placement  Transfers Overall transfer level: Needs assistance Equipment used: Rolling walker (2 wheeled) Transfers: Sit to/from Stand Sit to Stand: Min assist         General transfer comment: min A overall with min cuing for hand placement and technique    Balance Overall balance assessment: Needs assistance Sitting-balance support: Feet supported Sitting balance-Leahy Scale:  Good Sitting balance - Comments: supervision for safety   Standing balance support: Bilateral upper extremity supported Standing balance-Leahy Scale: Fair Standing balance comment: reliance on UE support         ADL either performed or assessed with clinical judgement   ADL Overall ADL's : Needs assistance/impaired     Grooming: Wash/dry hands;Wash/dry face;Oral care;Sitting;Set up      Lower Body Dressing: Maximal assistance;Sitting/lateral leans Lower Body Dressing Details (indicate cue type and reason): to don B shoes  Functional mobility during ADLs: Rolling walker;Minimal assistance General ADL Comments: Pt needing A to don B shoes but able to doff herself with increased time. She performs grooming tasks seated EOB with set up A. Anticipate UB self care with set up A and LB with mod A overall.     Vision Baseline Vision/History: Wears glasses Wears Glasses: At all times Patient Visual Report: No change from baseline Vision Assessment?: No apparent visual deficits            Pertinent Vitals/Pain Pain Assessment: Faces Faces Pain Scale: No hurt     Hand Dominance Right   Extremity/Trunk Assessment Upper Extremity Assessment Upper Extremity Assessment: Generalized weakness   Lower Extremity Assessment Lower Extremity Assessment: Defer to PT evaluation   Cervical / Trunk Assessment Cervical / Trunk Assessment: Kyphotic   Communication Communication Communication: No difficulties   Cognition Arousal/Alertness: Awake/alert Behavior During Therapy: Flat affect Overall Cognitive Status: No family/caregiver present to determine baseline cognitive functioning       General Comments: Pt oriented to self and location only. Pt needing max cuing for orientation to time and situation.  Home Living Family/patient expects to be discharged to:: Private residence Living Arrangements: Children Available Help at Discharge: Available 24 hours/day Type  of Home: Apartment Home Access: Ramped entrance     Home Layout: One level     Bathroom Shower/Tub: Occupational psychologist: Standard Bathroom Accessibility: Yes How Accessible: Accessible via walker Home Equipment: [place] - 4 wheels   Additional Comments: Pt reports taking sink bath with daughter's assistance      Prior Functioning/Environment Level of Independence: Independent with assistive device(s)          OT Problem List: Decreased strength;Decreased cognition;Decreased activity tolerance;Decreased safety awareness;Impaired balance (sitting and/or standing);Decreased knowledge of use of DME or AE      OT Treatment/Interventions: Self-care/ADL training;Therapeutic exercise;Therapeutic activities;Energy conservation;Neuromuscular education;Cognitive remediation/compensation;Patient/family education;Balance training    OT Goals(Current goals can be found in the care plan section) Acute Rehab OT Goals Patient Stated Goal: to go home OT Goal Formulation: With patient Time For Goal Achievement: 06/28/20 Potential to Achieve Goals: Good ADL Goals Pt Will Perform Grooming: with supervision;standing Pt Will Perform Lower Body Dressing: with min guard assist;sit to/from stand;with adaptive equipment Pt Will Transfer to Toilet: with supervision;ambulating Pt Will Perform Toileting - Clothing Manipulation and hygiene: with supervision;sit to/from stand  OT Frequency: Min 1X/week   Barriers to D/C: Other (comment)  none known at this time          AM-PAC OT "6 Clicks" Daily Activity     Outcome Measure Help from another person eating meals?: None Help from another person taking care of personal grooming?: A Little Help from another person toileting, which includes using toliet, bedpan, or urinal?: A Lot Help from another person bathing (including washing, rinsing, drying)?: A Lot Help from another person to put on and taking off regular upper body clothing?: A  Little Help from another person to put on and taking off regular lower body clothing?: A Lot 6 Click Score: 16   End of Session Equipment Utilized During Treatment: Rolling walker Nurse Communication: Mobility status;Precautions  Activity Tolerance: Patient tolerated treatment well Patient left: in bed;with call bell/phone within reach;with bed alarm set;with family/visitor present  OT Visit Diagnosis: Unsteadiness on feet (R26.81);Repeated falls (R29.6);Muscle weakness (generalized) (M62.81);History of falling (Z91.81)                Time: 8110-3159 OT Time Calculation (min): 24 min Charges:  OT General Charges $OT Visit: 1 Visit OT Evaluation $OT Eval Moderate Complexity: 1 Mod OT Treatments $Therapeutic Activity: 8-22 mins  Darleen Crocker, MS, OTR/L , CBIS ascom [phone]  06/14/20, 12:31 PM

## 2020-06-14 NOTE — Progress Notes (Signed)
[facility]  ROUNDING NOTE   Subjective:   Daughter at bedside. Agitation and hallucinations overnight.   UOP 3266mL.  Creatinine 1.7 (3.46)  Bicarb at 19mL/hr  Objective:  Vital signs in last 24 hours:  Temp:  [98.1 F (36.7 C)-98.6 F (37 C)] 98.1 F (36.7 C) (08/11 0716) Pulse Rate:  [96-108] 108 (08/11 0716) Resp:  [17-20] 19 (08/11 0716) BP: (124-170)/(71-89) 162/89 (08/11 0716) SpO2:  [96 %-99 %] 97 % (08/11 0716) Weight:  [125.9 kg] 125.9 kg (08/11 0339)  Weight change: -5.489 kg Filed Weights   06/12/20 2304 06/13/20 0511 06/14/20 0339  Weight: 127.7 kg 126.8 kg 125.9 kg    Intake/Output: I/O last 3 completed shifts: In: 2332.8 [P.O.:780; I.V.:1457.6; IV Piggyback:95.2] Out: 1517 [Urine:3550]   Intake/Output this shift:  Total I/O In: -  Out: 700 [Urine:700]  Physical Exam: General: NAD, sitting in bed  Head: Normocephalic, atraumatic. Moist oral mucosal membranes  Eyes: Anicteric, PERRL  Neck: Supple, trachea midline  Lungs:  Clear to auscultation  Heart: tachycardia  Abdomen:  Soft, nontender, obese  Extremities:  no peripheral edema.  Neurologic: Nonfocal, moving all four extremities  Skin: No lesions        Basic Metabolic Panel: Recent Labs  Lab 06/12/20 1149 06/13/20 0524 06/14/20 0526  NA 138 140 144  K 5.9* 4.7 4.5  CL 109 107 108  CO2 17* 19* 25  GLUCOSE 111* 213* 237*  BUN 101* 98* 60*  CREATININE 4.50* 3.46* 1.70*  CALCIUM 8.5* 9.0 9.2  PHOS  --   --  2.9    Liver Function Tests: Recent Labs  Lab 06/14/20 0526  ALBUMIN 4.0   No results for input(s): LIPASE, AMYLASE in the last 168 hours. No results for input(s): AMMONIA in the last 168 hours.  CBC: Recent Labs  Lab 06/12/20 1149 06/13/20 0524 06/14/20 0526  WBC 8.4 6.2 6.3  NEUTROABS  --   --  4.0  HGB 10.2* 10.4* 10.5*  HCT 32.6* 33.5* 32.6*  MCV 92.4 90.1 87.9  PLT 128* 131* 142*    Cardiac Enzymes: Recent Labs  Lab 06/12/20 1905   CKTOTAL 387*    BNP: Invalid input(s): POCBNP  CBG: Recent Labs  Lab 06/13/20 0750 06/13/20 1129 06/13/20 1616 06/13/20 2125 06/14/20 0714  GLUCAP 212* 242* 266* 212* 251*    Microbiology: Results for orders placed or performed during the hospital encounter of 06/12/20  SARS Coronavirus 2 by RT PCR (hospital order, performed in [facility] hospital lab) Nasopharyngeal Nasopharyngeal Swab     Status: None   Collection Time: 06/12/20 11:49 AM   Specimen: Nasopharyngeal Swab  Result Value Ref Range Status   SARS Coronavirus 2 NEGATIVE NEGATIVE Final    Comment: (NOTE) SARS-CoV-2 target nucleic acids are NOT DETECTED.  The SARS-CoV-2 RNA is generally detectable in upper and lower respiratory specimens during the acute phase of infection. The lowest concentration of SARS-CoV-2 viral copies this assay can detect is 250 copies / mL. A negative result does not preclude SARS-CoV-2 infection and should not be used as the sole basis for treatment or other patient management decisions.  A negative result may occur with improper specimen collection / handling, submission of specimen other than nasopharyngeal swab, presence of viral mutation(s) within the areas targeted by this assay, and inadequate number of viral copies (<250 copies / mL). A negative result must be combined with clinical observations, patient history, and epidemiological information.  Fact Sheet for Patients:   StrictlyIdeas.no  Fact Sheet for Healthcare Providers: BankingDealers.co.za  This test is not yet approved or  cleared by the [place] FDA and has been authorized for detection and/or diagnosis of SARS-CoV-2 by FDA under an Emergency Use Authorization (EUA).  This EUA will remain in effect (meaning this test can be used) for the duration of the COVID-19 declaration under Section 564(b)(1) of the Act, 21 U.S.C. section 360bbb-3(b)(1), unless the  authorization is terminated or revoked sooner.  Performed at [facility], [address] [place], [place] [address]   Blood culture (routine x 2)     Status: None (Preliminary result)   Collection Time: 06/12/20  7:05 PM   Specimen: BLOOD  Result Value Ref Range Status   Specimen Description BLOOD LEFT HAND  Final   Special Requests   Final    BOTTLES DRAWN AEROBIC AND ANAEROBIC Blood Culture results may not be optimal due to an excessive volume of blood received in culture bottles   Culture   Final    NO GROWTH 2 DAYS Performed at [facility], [address], [place], [place] [address]    Report Status PENDING  Incomplete  Blood culture (routine x 2)     Status: None (Preliminary result)   Collection Time: 06/12/20  7:16 PM   Specimen: BLOOD LEFT HAND  Result Value Ref Range Status   Specimen Description BLOOD LEFT HAND  Final   Special Requests   Final    BOTTLES DRAWN AEROBIC AND ANAEROBIC Blood Culture adequate volume   Culture   Final    NO GROWTH 2 DAYS Performed at [facility], [address] [place], [place] [address]    Report Status PENDING  Incomplete    Coagulation Studies: No results for input(s): LABPROT, INR in the last 72 hours.  Urinalysis: Recent Labs    06/12/20 1149  COLORURINE YELLOW*  LABSPEC 1.013  PHURINE 5.0  GLUCOSEU NEGATIVE  HGBUR NEGATIVE  BILIRUBINUR NEGATIVE  KETONESUR NEGATIVE  PROTEINUR 100*  NITRITE NEGATIVE  LEUKOCYTESUR LARGE*      Imaging: CT Head Wo Contrast  Result Date: 06/12/2020 CLINICAL DATA:  Weakness and unable to walk for 2 days.  Neck pain. EXAM: CT HEAD WITHOUT CONTRAST CT CERVICAL SPINE WITHOUT CONTRAST TECHNIQUE: Multidetector CT imaging of the head and cervical spine was performed following the standard protocol without intravenous contrast. Multiplanar CT image reconstructions of the cervical spine were also generated. COMPARISON:  03/06/2020 FINDINGS: CT HEAD FINDINGS Brain:  No evidence of acute infarction, hemorrhage, extra-axial collection, ventriculomegaly, or mass effect. Generalized cerebral atrophy. Periventricular white matter low attenuation likely secondary to microangiopathy. Vascular: Cerebrovascular atherosclerotic calcifications are noted. Skull: Negative for fracture or focal lesion. Sinuses/Orbits: Visualized portions of the orbits are unremarkable. Visualized portions of the paranasal sinuses are unremarkable. Visualized portions of the mastoid air cells are unremarkable. Other: None. CT CERVICAL SPINE FINDINGS Alignment: Normal. Skull base and vertebrae: No acute fracture. No primary bone lesion or focal pathologic process. Soft tissues and spinal canal: No prevertebral fluid or swelling. No visible canal hematoma. Disc levels: Degenerative disease with disc height loss at C5-6. Mild bilateral facet arthropathy throughout the cervical spine most severe at C3-4 and C4-5 on the left. Upper chest: Lung apices are clear. Other: No fluid collection or hematoma. IMPRESSION: 1. No acute intracranial pathology. 2. No acute osseous injury of the cervical spine. 3. Cervical spine spondylosis as described above. Electronically Signed   By: Kathreen Devoid   On: 06/12/2020 13:15  CT Cervical Spine Wo Contrast  Result Date: 06/12/2020 CLINICAL DATA:  Weakness and unable to walk for 2 days.  Neck pain. EXAM: CT HEAD WITHOUT CONTRAST CT CERVICAL SPINE WITHOUT CONTRAST TECHNIQUE: Multidetector CT imaging of the head and cervical spine was performed following the standard protocol without intravenous contrast. Multiplanar CT image reconstructions of the cervical spine were also generated. COMPARISON:  03/06/2020 FINDINGS: CT HEAD FINDINGS Brain: No evidence of acute infarction, hemorrhage, extra-axial collection, ventriculomegaly, or mass effect. Generalized cerebral atrophy. Periventricular white matter low attenuation likely secondary to microangiopathy. Vascular: Cerebrovascular  atherosclerotic calcifications are noted. Skull: Negative for fracture or focal lesion. Sinuses/Orbits: Visualized portions of the orbits are unremarkable. Visualized portions of the paranasal sinuses are unremarkable. Visualized portions of the mastoid air cells are unremarkable. Other: None. CT CERVICAL SPINE FINDINGS Alignment: Normal. Skull base and vertebrae: No acute fracture. No primary bone lesion or focal pathologic process. Soft tissues and spinal canal: No prevertebral fluid or swelling. No visible canal hematoma. Disc levels: Degenerative disease with disc height loss at C5-6. Mild bilateral facet arthropathy throughout the cervical spine most severe at C3-4 and C4-5 on the left. Upper chest: Lung apices are clear. Other: No fluid collection or hematoma. IMPRESSION: 1. No acute intracranial pathology. 2. No acute osseous injury of the cervical spine. 3. Cervical spine spondylosis as described above. Electronically Signed   By: Kathreen Devoid   On: 06/12/2020 13:15   US RENAL  Result Date: 06/12/2020 CLINICAL DATA:  Acute kidney injury. EXAM: RENAL / URINARY TRACT ULTRASOUND COMPLETE COMPARISON:  Complete abdomen ultrasound dated 07/12/2008. Abdomen and pelvis CT dated 03/06/2020 FINDINGS: Right Kidney: Renal measurements: 11.0 x 7.1 x 6.0 = volume: 247 mL . Echogenicity within normal limits. No mass or hydronephrosis visualized. Left Kidney: Renal measurements: 12.1 x 6.4 x 5.6 cm = volume: 224 mL. Echogenicity within normal limits. No mass or hydronephrosis visualized. Bladder: Appears normal for degree of bladder distention. Other: None. IMPRESSION: Normal examination. Electronically Signed   By: Claudie Revering M.D.   On: 06/12/2020 16:33   DG Chest Portable 1 View  Result Date: 06/12/2020 CLINICAL DATA:  Weakness. EXAM: PORTABLE CHEST 1 VIEW COMPARISON:  03/06/2020 FINDINGS: Stable cardiomegaly. Low lung volumes. Central venous congestion. No focal infiltrate. No pneumothorax. IMPRESSION:  Cardiomegaly and central venous congestion.  Low lung volumes. Electronically Signed   By: Suzy Bouchard M.D.   On: 06/12/2020 13:58     Medications:   . cefTRIAXone (ROCEPHIN)  IV 1 g (06/13/20 1636)  .  sodium bicarbonate  infusion 1000 mL 75 mL/hr at 06/14/20 0946   . albuterol  7.5 mg/hr Nebulization Once  . aspirin EC  81 mg Oral Daily  . atorvastatin  40 mg Oral QHS  . cholecalciferol  1,000 Units Oral Daily  . clonazePAM  0.5 mg Oral Daily  . cyclobenzaprine  5 mg Oral Daily  . DULoxetine  60 mg Oral Daily  . enoxaparin (LOVENOX) injection  40 mg Subcutaneous Q24H  . insulin aspart  0-20 Units Subcutaneous TID WC  . insulin glargine  25 Units Subcutaneous QHS  . levothyroxine  75 mcg Oral Q0600  . QUEtiapine  100 mg Oral QHS  . topiramate  50 mg Oral Daily   acetaminophen **OR** acetaminophen, fluticasone, ondansetron **OR** ondansetron (ZOFRAN) IV  Assessment/ Plan:  [name] is a 74 y.o. white female with sleep apnea, dementia, coronary artery disease, osteoporosis, hypertension, hyperlipidemia, diabetes mellitus type II, congestive heart failure, depression, anxiety  and hypothyroidism, who was admitted to [facility] on 06/12/2020 for Dehydration [E86.0] Hyperkalemia [E87.5] Lactic acidosis [E87.2] Normocytic anemia [D64.9] Prerenal azotemia [R79.89] Generalized weakness [R53.1] AKI (acute kidney injury) ([place]) [N17.9]  1. Acute renal failure with hyperkalemia and metabolic acidosis: baseline creatinine of 0.9, GFR of >60 on 04/22/20. History of proteinuria.  History consistent with prerenal azotemia.  No IV contrast exposure. Renal ultrasound with no obstruction No indication for dialysis.  - holding lisinopril  - Continue IV bicarbonate infusion.   2. Anemia with renal failure: hemoglobin 10.4. normocytic  3. Hypertension: 162/89 - elevated. Holding home dose of lisinopril. However suspect her blood pressure is elevated secondary to agitation. -  metoprolol  4. 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 Hemoglobin A1c of 8.4%.  - holding metformin.   5. Urinary tract infection: no growth on urine culture.  - empiric ceftriaxone.    LOS: 2 Nusayba Cadenas 8/11/202110:39 AM

## 2020-06-14 NOTE — Progress Notes (Signed)
Daughter at beside request mother be walked by PT today. PT had previously seen patient this morning. This RN contacted Robert Bellow PT about revisiting the patient. Stated she had already seen her today. I asked if she would be back to see patient tomorrow and she responded yes. Patient refusing SNF. MD aware and recommending Josephine PT at discharge if appropriate.

## 2020-06-15 ENCOUNTER — Inpatient Hospital Stay: Payer: Medicare Other

## 2020-06-15 LAB — URINE CULTURE: Culture: >=100000 — AB

## 2020-06-15 LAB — CBC WITH DIFFERENTIAL/PLATELET
Abs Immature Granulocytes: 0.11 10*3/uL — ABNORMAL HIGH (ref 0.00–0.07)
Basophils Absolute: 0 10*3/uL (ref 0.0–0.1)
Basophils Relative: 1 %
Eosinophils Absolute: 0.2 10*3/uL (ref 0.0–0.5)
Eosinophils Relative: 4 %
HCT: 30 % — ABNORMAL LOW (ref 36.0–46.0)
Hemoglobin: 9.7 g/dL — ABNORMAL LOW (ref 12.0–15.0)
Immature Granulocytes: 2 %
Lymphocytes Relative: 24 %
Lymphs Abs: 1.5 10*3/uL (ref 0.7–4.0)
MCH: 28.6 pg (ref 26.0–34.0)
MCHC: 32.3 g/dL (ref 30.0–36.0)
MCV: 88.5 fL (ref 80.0–100.0)
Monocytes Absolute: 0.6 10*3/uL (ref 0.1–1.0)
Monocytes Relative: 10 %
Neutro Abs: 3.8 10*3/uL (ref 1.7–7.7)
Neutrophils Relative %: 59 %
Platelets: 133 10*3/uL — ABNORMAL LOW (ref 150–400)
RBC: 3.39 MIL/uL — ABNORMAL LOW (ref 3.87–5.11)
RDW: 15.8 % — ABNORMAL HIGH (ref 11.5–15.5)
WBC: 6.2 10*3/uL (ref 4.0–10.5)
nRBC: 0 % (ref 0.0–0.2)

## 2020-06-15 LAB — TROPONIN I (HIGH SENSITIVITY)
Troponin I (High Sensitivity): 9 ng/L (ref <18)
Troponin I (High Sensitivity): 8 ng/L (ref <18)

## 2020-06-15 LAB — GLUCOSE, CAPILLARY
Glucose-Capillary: 227 mg/dL — ABNORMAL HIGH (ref 70–99)
Glucose-Capillary: 194 mg/dL — ABNORMAL HIGH (ref 70–99)
Glucose-Capillary: 269 mg/dL — ABNORMAL HIGH (ref 70–99)
Glucose-Capillary: 241 mg/dL — ABNORMAL HIGH (ref 70–99)

## 2020-06-15 LAB — BASIC METABOLIC PANEL
Anion gap: 10 (ref 5–15)
BUN: 29 mg/dL — ABNORMAL HIGH (ref 8–23)
CO2: 26 mmol/L (ref 22–32)
Calcium: 8.8 mg/dL — ABNORMAL LOW (ref 8.9–10.3)
Chloride: 110 mmol/L (ref 98–111)
Creatinine, Ser: 1.07 mg/dL — ABNORMAL HIGH (ref 0.44–1.00)
GFR calc Af Amer: 60 mL/min — ABNORMAL LOW (ref >60)
GFR calc non Af Amer: 51 mL/min — ABNORMAL LOW (ref >60)
Glucose, Bld: 215 mg/dL — ABNORMAL HIGH (ref 70–99)
Potassium: 3.7 mmol/L (ref 3.5–5.1)
Sodium: 146 mmol/L — ABNORMAL HIGH (ref 135–145)

## 2020-06-15 MED ORDER — CEPHALEXIN 250 MG PO CAPS
250.0000 mg | ORAL_CAPSULE | Freq: Four times a day (QID) | ORAL | Status: DC
  Administered 2020-06-15 – 2020-06-16 (×5): 250 mg via ORAL
  Filled 2020-06-15 (×6): qty 1

## 2020-06-15 MED ORDER — SODIUM CHLORIDE 0.9 % IV SOLN
1.0000 g | Freq: Three times a day (TID) | INTRAVENOUS | Status: DC
  Administered 2020-06-15: 1 g via INTRAVENOUS
  Filled 2020-06-15 (×3): qty 1

## 2020-06-15 MED ORDER — LORAZEPAM 0.5 MG PO TABS
0.5000 mg | ORAL_TABLET | ORAL | Status: DC | PRN
  Administered 2020-06-15: 0.5 mg via ORAL
  Filled 2020-06-15: qty 1

## 2020-06-15 MED ORDER — SALINE SPRAY 0.65 % NA SOLN
1.0000 | NASAL | Status: DC | PRN
  Filled 2020-06-15: qty 44

## 2020-06-15 MED ORDER — VILAZODONE HCL 40 MG PO TABS
40.0000 mg | ORAL_TABLET | Freq: Every day | ORAL | Status: DC
  Administered 2020-06-15 – 2020-06-16 (×2): 40 mg via ORAL
  Filled 2020-06-15 (×3): qty 1

## 2020-06-15 NOTE — Progress Notes (Signed)
PROGRESS NOTE  [name] VVO:[id number] DOB: 01/02/1946 DOA: 06/12/2020 PCP: System, Pcp Not In  Brief History   [name] is a 74 y.o. female with medical history significant formorbid obesity,diabetes mellitus, hypertension, depression, GERD and anxiety who was brought in by EMS after her family called because she fell at home and was unable to get up. Her daughter also reported that patient was having visual hallucinations at home.  In the ED, she was hypotensive and work-up revealed acute kidney injury complicated by hyperkalemia.  The patient complained of chest pain this morning. She is at high risk for cardiovascular disease. Initial EKG and troponin were negative. Will follow.  PT has stated that the patient is at high risk for falls and has recommended SNF.   Consultants  . nephrology  Procedures  . None  Antibiotics   Anti-infectives (From admission, onward)   Start     Dose/Rate Route Frequency Ordered Stop   06/15/20 1200  cephALEXin (KEFLEX) capsule 250 mg     Discontinue     250 mg Oral Every 6 hours 06/15/20 1132 06/17/20 2359   06/15/20 0730  meropenem (MERREM) 1 g in sodium chloride 0.9 % 100 mL IVPB  Status:  Discontinued        1 g 200 mL/hr over 30 Minutes Intravenous Every 8 hours 06/15/20 0718 06/15/20 1132   06/14/20 1200  meropenem (MERREM) 1 g in sodium chloride 0.9 % 100 mL IVPB  Status:  Discontinued        1 g 200 mL/hr over 30 Minutes Intravenous Every 12 hours 06/14/20 1136 06/15/20 0718   06/13/20 1500  cefTRIAXone (ROCEPHIN) 1 g in sodium chloride 0.9 % 100 mL IVPB  Status:  Discontinued        1 g 200 mL/hr over 30 Minutes Intravenous Every 24 hours 06/13/20 1449 06/14/20 1136      Subjective  The patient is resting in bed. No new complaints.   Objective   Vitals:  Vitals:   06/15/20 1243 06/15/20 1604  BP: (!) 176/87 (!) 161/91  Pulse: 83 91  Resp:    Temp: 99.2 F (37.3 C) 100.2 F (37.9 C)  SpO2: 96% 93%    Exam:  Constitutional:  . The patient is awake, alert, mildly confused. No acute distress. Respiratory:  . No increased work of breathing. . No wheezes, rales, or rhonchi . No tactile fremitus Cardiovascular:  . Regular rate and rhythm . No murmurs, ectopy, or gallups. . No lateral PMI. No thrills. Abdomen:  . Abdomen is soft, non-tender, non-distended . No hernias, masses, or organomegaly . Normoactive bowel sounds.  Musculoskeletal:  . No cyanosis, clubbing, or edema Skin:  . No rashes, lesions, ulcers . palpation of skin: no induration or nodules Neurologic:  . CN 2-12 intact . Sensation all 4 extremities intact Psychiatric:  . Mental status o Mood, affect appropriate o Orientation to person, place, time  . judgment and insight appear impaired.  I have personally reviewed the following:   Today's Data  . Vitals, BMP, CBC  Micro Data  Urine culture has grown out 100K colonies of Klebsiella pneumonia. Sensitivities pending. The patient is receiving meropenem.  EKG: Unchanged.   Troponin: negative x 2. Flat.  Scheduled Meds: . albuterol  7.5 mg/hr Nebulization Once  . aspirin EC  81 mg Oral Daily  . atorvastatin  40 mg Oral QHS  . busPIRone  30 mg Oral BID  . cephALEXin  250 mg Oral Q6H  .  cholecalciferol  1,000 Units Oral Daily  . cyclobenzaprine  5 mg Oral Daily  . DULoxetine  60 mg Oral Daily  . enoxaparin (LOVENOX) injection  40 mg Subcutaneous Q12H  . insulin aspart  0-20 Units Subcutaneous TID WC  . insulin glargine  30 Units Subcutaneous QHS  . levothyroxine  75 mcg Oral Q0600  . memantine  28 mg Oral Daily  . QUEtiapine  100 mg Oral QHS  . topiramate  50 mg Oral Daily  . Vilazodone HCl  40 mg Oral Daily   Continuous Infusions:   Principal Problem:   AKI (acute kidney injury) ([place]) Active Problems:   Diabetes mellitus with complication (HCC)   Essential hypertension   CAD (coronary artery disease)   Acute metabolic encephalopathy    Morbid obesity with BMI of 45.0-49.9, adult (HCC)   Lactic acidosis   Hyperkalemia   LOS: 3 days   A & P  Acute kidney injury complicated by hyperkalemia and metabolic acidosis: Hyperkalemia has resolved.  Creatinine was 0.9 on 04/22/2020.  Improved from 3.46 on admission to 1.7 today. BUN and creatinine are stable significantly elevated.  Continue IV fluids (sodium bicarbonate infusion) and monitor BMP. Renal ultrasound did not show any evidence of obstruction.  Follow-up with nephrologist. Lasix and lisinopril have been held.  Chest pain: Likely GI in origin. Troponins negative x 2. EKG unchanged from previous with pain. Will repeat in am.  Klebsiella UTI: the patient has been changed to meropenem. Sensitivities on urine culture are pending.  Acute metabolic encephalopathy: Supportive care  Anemia of chronic disease: H&H is stable.  Type II DM with hyperglycemia: Resume Lantus but decrease dose from 75 units to 25 units nightly.  Adjust dose of Lantus based on glucose levels.  Metformin on hold.  NovoLog as needed for hyperglycemia.  Hypertension: Lasix and lisinopril on hold  CAD s/p DES to proximal LAD July 2019: Continue aspirin and Lipitor  Anxiety and depression: Continue psychotropics  Falls at home: PT and OT evaluation/ Pt is refusing rehab. She admits that the person who will be home with her will not be able to get her up or help her if she falls, but she states that someone else will come to help.  I have seen and examined this patient myself. I have spent 30 minutes in her evaluation and care.  DVT prophylaxis: Lovenox. CODE STATUS: DNR Family Communication: Family at bedside Disposition:  Status is: Inpatient  Remains inpatient appropriate because:IV treatments appropriate due to intensity of illness or inability to take PO   Dispo: The patient is from: Home              Anticipated d/c is to: Home              Anticipated d/c date is: 2 days.               Patient currently is not medically stable to d/c.  Cyris Maalouf, DO [facility] Direct contact: see [email or web address]  7PM-7AM contact night coverage as above 06/15/2020, 5:25 PM  LOS: 2 days

## 2020-06-15 NOTE — Progress Notes (Signed)
Giving morning meds. Daughter at patient's bedside. Patient complains on chest pain that she can not rate on pain scale (Confusion) and nausea. She reported to me she had been vomiting all morning in basin at bedside. She handed me basin; scant amount of spit in basin only. Will notify MD Swayze. No other complaints. Confused about where she is and why she is at the hospital. Confused about why I am giving her medications this morning. States she has not ate nor slept in 4 days. Night shift reported she slept throughout the night. Daughter at bedside yesterday during meal times to bring patient food: [employer] sandwich at lunch, Fried seafood plate for dinner, Egg biscuit this morning. Patient was eating each time I assessed her yesterday without any complaints of nausea. Daughter is insistent patient walks with PT today to prepare for discharge, I discusses this yesterday with Amanda Cockayne PT and she is aware.  I will continue to assess patient.

## 2020-06-15 NOTE — Progress Notes (Signed)
Inpatient Diabetes Program Recommendations  AACE/ADA: New Consensus Statement on Inpatient Glycemic Control   Target Ranges:  Prepandial:   less than 140 mg/dL      Peak postprandial:   less than 180 mg/dL (1-2 hours)      Critically ill patients:  140 - 180 mg/dL   Results for [name], [name] (MRN [id number]) as of 06/15/2020 12:02  Ref. Range 06/14/2020 07:14 06/14/2020 11:36 06/14/2020 16:51 06/14/2020 21:41 06/15/2020 07:59  Glucose-Capillary Latest Ref Range: 70 - 99 mg/dL 251 (H) 257 (H) 260 (H) 289 (H) 227 (H)   Review of Glycemic Control  Diabetes history: DM2 Outpatient Diabetes medications: Lantus 75 units QHS, Metformin 1000 mg BID Current orders for Inpatient glycemic control: Lantus 30 units QHS, Novolog 0-20 units TID with meals  Inpatient Diabetes Program Recommendations:    Insulin: Please consider increasing Lantus to 35 units QHS, adding Novolog 0-5 units QHS, and ordering Novolog 4 units TID with meals for meal coverage if patient eats at least 50% of meals.  Thanks, Barnie Alderman, RN, MSN, CDE Diabetes Coordinator Inpatient Diabetes Program [phone] (Team Pager from 8am to 5pm)

## 2020-06-15 NOTE — Progress Notes (Signed)
Physical Therapy Treatment Patient Details Name: [name] MRN: [id number] DOB: July 22, 1946 Today's Date: 06/15/2020    History of Present Illness Pt admitted for AKI with fall and inability to get up and AMS. History includes CAD, depression, Chronic pain, DM, HTN, and HLD.    PT Comments    Patient received in bed, reports she is feeling good.  Daughter present in room for session. She is agreeable to PT session. Patient requires min guard for bed mobility, heavy use of bed rails and increased time and effort. She performed sit to stand with min guard and increased effort. Ambulated with BRW 120 feet with min guard. SOB with activity, but no LOB. Patient will continue to benefit from skilled PT while here to improve strength and functional mobility for safety and fall prevention at home.       Follow Up Recommendations  Home health PT     Equipment Recommendations  None recommended by PT    Recommendations for Other Services       Precautions / Restrictions Precautions Precautions: Fall Restrictions Weight Bearing Restrictions: No    Mobility  Bed Mobility Overal bed mobility: Needs Assistance Bed Mobility: Supine to Sit;Sit to Supine     Supine to sit: Min guard Sit to supine: Min guard   General bed mobility comments: with use of bed rails and min cuing for technique and hand placement  Transfers Overall transfer level: Needs assistance Equipment used: Rolling walker (2 wheeled) Transfers: Sit to/from Stand Sit to Stand: Min assist         General transfer comment: min A overall with min cuing for hand placement and technique  Ambulation/Gait Ambulation/Gait assistance: Min guard Gait Distance (Feet): 120 Feet Assistive device: Rolling walker (2 wheeled) Gait Pattern/deviations: Wide base of support;Step-to pattern;Decreased stride length Gait velocity: decr   General Gait Details: patient steady with RW and supervision/min guard. No LOB, SOB with  mobility but O2 sats at 95% on room air. HR at 116   Stairs             Wheelchair Mobility    Modified Rankin (Stroke Patients Only)       Balance Overall balance assessment: Needs assistance Sitting-balance support: Feet supported Sitting balance-Leahy Scale: Fair Sitting balance - Comments: supervision for safety   Standing balance support: Bilateral upper extremity supported;During functional activity Standing balance-Leahy Scale: Fair Standing balance comment: reliance on UE support                            Cognition Arousal/Alertness: Awake/alert Behavior During Therapy: WFL for tasks assessed/performed Overall Cognitive Status: Impaired/Different from baseline                                 General Comments: Patient seems somewhat confused during session, but appropriate.      Exercises      General Comments        Pertinent Vitals/Pain Faces Pain Scale: Hurts a little bit Pain Location: back Pain Descriptors / Indicators: Discomfort;Aching Pain Intervention(s): Monitored during session    Home Living                      Prior Function            PT Goals (current goals can now be found in the care plan section) Acute Rehab PT Goals  Patient Stated Goal: to go home PT Goal Formulation: With patient/family Time For Goal Achievement: 06/28/20 Potential to Achieve Goals: Good Progress towards PT goals: Progressing toward goals    Frequency    Min 2X/week      PT Plan Current plan remains appropriate    Co-evaluation              AM-PAC PT "6 Clicks" Mobility   Outcome Measure  Help needed turning from your back to your side while in a flat bed without using bedrails?: A Little Help needed moving from lying on your back to sitting on the side of a flat bed without using bedrails?: A Little Help needed moving to and from a bed to a chair (including a wheelchair)?: A Little Help needed  standing up from a chair using your arms (e.g., wheelchair or bedside chair)?: A Little Help needed to walk in hospital room?: A Little Help needed climbing 3-5 steps with a railing? : A Lot 6 Click Score: 17    End of Session Equipment Utilized During Treatment: Gait belt Activity Tolerance: Patient tolerated treatment well Patient left: in bed;with call bell/phone within reach;with family/visitor present Nurse Communication: Mobility status;Other (comment) (pure wick needs to be replaced, IV beeping) PT Visit Diagnosis: History of falling (Z91.81);Muscle weakness (generalized) (M62.81);Difficulty in walking, not elsewhere classified (R26.2)     Time: 1030-1050 PT Time Calculation (min) (ACUTE ONLY): 20 min  Charges:  $Gait Training: 8-22 mins                     Darelle Kings, PT, GCS 06/15/20,11:12 AM

## 2020-06-15 NOTE — Progress Notes (Signed)
[facility]  ROUNDING NOTE   Subjective:   Daughter at bedside.   Bicarb at 97mL/hr  Objective:  Vital signs in last 24 hours:  Temp:  [98.4 F (36.9 C)-99.2 F (37.3 C)] 99.2 F (37.3 C) (08/12 1243) Pulse Rate:  [83-105] 83 (08/12 1243) Resp:  [16-19] 16 (08/11 1952) BP: (147-176)/(68-92) 176/87 (08/12 1243) SpO2:  [94 %-98 %] 96 % (08/12 1243) Weight:  [127.1 kg] 127.1 kg (08/12 0332)  Weight change: 1.179 kg Filed Weights   06/13/20 0511 06/14/20 0339 06/15/20 0332  Weight: 126.8 kg 125.9 kg 127.1 kg    Intake/Output: I/O last 3 completed shifts: In: 2643.3 [P.O.:360; I.V.:1988; IV Piggyback:295.2] Out: 3600 [Urine:3600]   Intake/Output this shift:  No intake/output data recorded.  Physical Exam: General: NAD, sitting in bed  Head: Normocephalic, atraumatic. Moist oral mucosal membranes  Eyes: Anicteric, PERRL  Neck: Supple, trachea midline  Lungs:  Clear to auscultation  Heart: regular  Abdomen:  Soft, nontender, obese  Extremities:  no peripheral edema.  Neurologic: Nonfocal, moving all four extremities  Skin: No lesions        Basic Metabolic Panel: Recent Labs  Lab 06/12/20 1149 06/12/20 1149 06/13/20 0524 06/14/20 0526 06/15/20 0616  NA 138  --  140 144 146*  K 5.9*  --  4.7 4.5 3.7  CL 109  --  107 108 110  CO2 17*  --  19* 25 26  GLUCOSE 111*  --  213* 237* 215*  BUN 101*  --  98* 60* 29*  CREATININE 4.50*  --  3.46* 1.70* 1.07*  CALCIUM 8.5*   < > 9.0 9.2 8.8*  PHOS  --   --   --  2.9  --    < > = values in this interval not displayed.    Liver Function Tests: Recent Labs  Lab 06/14/20 0526  ALBUMIN 4.0   No results for input(s): LIPASE, AMYLASE in the last 168 hours. No results for input(s): AMMONIA in the last 168 hours.  CBC: Recent Labs  Lab 06/12/20 1149 06/13/20 0524 06/14/20 0526 06/15/20 0616  WBC 8.4 6.2 6.3 6.2  NEUTROABS  --   --  4.0 3.8  HGB 10.2* 10.4* 10.5* 9.7*  HCT 32.6* 33.5* 32.6*  30.0*  MCV 92.4 90.1 87.9 88.5  PLT 128* 131* 142* 133*    Cardiac Enzymes: Recent Labs  Lab 06/12/20 1905  CKTOTAL 387*    BNP: Invalid input(s): POCBNP  CBG: Recent Labs  Lab 06/14/20 1136 06/14/20 1651 06/14/20 2141 06/15/20 0759 06/15/20 1244  GLUCAP 257* 260* 289* 227* 194*    Microbiology: Results for orders placed or performed during the hospital encounter of 06/12/20  SARS Coronavirus 2 by RT PCR (hospital order, performed in [facility] hospital lab) Nasopharyngeal Nasopharyngeal Swab     Status: None   Collection Time: 06/12/20 11:49 AM   Specimen: Nasopharyngeal Swab  Result Value Ref Range Status   SARS Coronavirus 2 NEGATIVE NEGATIVE Final    Comment: (NOTE) SARS-CoV-2 target nucleic acids are NOT DETECTED.  The SARS-CoV-2 RNA is generally detectable in upper and lower respiratory specimens during the acute phase of infection. The lowest concentration of SARS-CoV-2 viral copies this assay can detect is 250 copies / mL. A negative result does not preclude SARS-CoV-2 infection and should not be used as the sole basis for treatment or other patient management decisions.  A negative result may occur with improper specimen collection / handling, submission of specimen other  than nasopharyngeal swab, presence of viral mutation(s) within the areas targeted by this assay, and inadequate number of viral copies (<250 copies / mL). A negative result must be combined with clinical observations, patient history, and epidemiological information.  Fact Sheet for Patients:   StrictlyIdeas.no  Fact Sheet for Healthcare Providers: BankingDealers.co.za  This test is not yet approved or  cleared by the [place] FDA and has been authorized for detection and/or diagnosis of SARS-CoV-2 by FDA under an Emergency Use Authorization (EUA).  This EUA will remain in effect (meaning this test can be used) for the duration of  the COVID-19 declaration under Section 564(b)(1) of the Act, 21 U.S.C. section 360bbb-3(b)(1), unless the authorization is terminated or revoked sooner.  Performed at [facility], [address] [place], [place] [address]   Urine Culture     Status: Abnormal   Collection Time: 06/12/20 11:49 AM   Specimen: Urine, Random  Result Value Ref Range Status   Specimen Description   Final    URINE, RANDOM Performed at [facility], [address] [place], [place] [address]    Special Requests   Final    NONE Performed at [facility], [address] [place], [place] [address]    Culture >=100,000 COLONIES/mL KLEBSIELLA PNEUMONIAE (A)  Final   Report Status 06/15/2020 FINAL  Final   Organism ID, Bacteria KLEBSIELLA PNEUMONIAE (A)  Final      Susceptibility   Klebsiella pneumoniae - MIC*    AMPICILLIN >=32 RESISTANT Resistant     CEFAZOLIN <=4 SENSITIVE Sensitive     CEFTRIAXONE <=0.25 SENSITIVE Sensitive     CIPROFLOXACIN <=0.25 SENSITIVE Sensitive     GENTAMICIN <=1 SENSITIVE Sensitive     IMIPENEM <=0.25 SENSITIVE Sensitive     NITROFURANTOIN 64 INTERMEDIATE Intermediate     TRIMETH/SULFA <=20 SENSITIVE Sensitive     AMPICILLIN/SULBACTAM 8 SENSITIVE Sensitive     PIP/TAZO 8 SENSITIVE Sensitive     * >=100,000 COLONIES/mL KLEBSIELLA PNEUMONIAE  Blood culture (routine x 2)     Status: None (Preliminary result)   Collection Time: 06/12/20  7:05 PM   Specimen: BLOOD  Result Value Ref Range Status   Specimen Description BLOOD LEFT HAND  Final   Special Requests   Final    BOTTLES DRAWN AEROBIC AND ANAEROBIC Blood Culture results may not be optimal due to an excessive volume of blood received in culture bottles   Culture   Final    NO GROWTH 3 DAYS Performed at [facility], [address], [place], [place] [address]    Report Status PENDING  Incomplete  Blood culture (routine x 2)     Status: None (Preliminary result)   Collection Time:  06/12/20  7:16 PM   Specimen: BLOOD LEFT HAND  Result Value Ref Range Status   Specimen Description BLOOD LEFT HAND  Final   Special Requests   Final    BOTTLES DRAWN AEROBIC AND ANAEROBIC Blood Culture adequate volume   Culture   Final    NO GROWTH 3 DAYS Performed at [facility], [address] [place], [place] [address]    Report Status PENDING  Incomplete    Coagulation Studies: No results for input(s): LABPROT, INR in the last 72 hours.  Urinalysis: No results for input(s): COLORURINE, LABSPEC, PHURINE, GLUCOSEU, HGBUR, BILIRUBINUR, KETONESUR, PROTEINUR, UROBILINOGEN, NITRITE, LEUKOCYTESUR in the last 72 hours.  Invalid input(s): APPERANCEUR    Imaging: DG Chest Port 1 View  Result Date:  06/15/2020 CLINICAL DATA:  Chest pain EXAM: PORTABLE CHEST 1 VIEW COMPARISON:  06/12/2020 chest radiograph. FINDINGS: Stable cardiomediastinal silhouette with top-normal heart size. No pneumothorax. No pleural effusion. No overt pulmonary edema. Low lung volumes. Mild right basilar atelectasis is stable. IMPRESSION: Low lung volumes with mild right basilar atelectasis. Electronically Signed   By: Ilona Sorrel M.D.   On: 06/15/2020 09:19     Medications:   . lactated ringers 75 mL/hr at 06/15/20 0427   . albuterol  7.5 mg/hr Nebulization Once  . aspirin EC  81 mg Oral Daily  . atorvastatin  40 mg Oral QHS  . busPIRone  30 mg Oral BID  . cephALEXin  250 mg Oral Q6H  . cholecalciferol  1,000 Units Oral Daily  . cyclobenzaprine  5 mg Oral Daily  . DULoxetine  60 mg Oral Daily  . enoxaparin (LOVENOX) injection  40 mg Subcutaneous Q12H  . insulin aspart  0-20 Units Subcutaneous TID WC  . insulin glargine  30 Units Subcutaneous QHS  . levothyroxine  75 mcg Oral Q0600  . memantine  28 mg Oral Daily  . QUEtiapine  100 mg Oral QHS  . topiramate  50 mg Oral Daily   acetaminophen **OR** acetaminophen, fluticasone, LORazepam, ondansetron **OR** ondansetron (ZOFRAN) IV,  oxyCODONE-acetaminophen **AND** oxyCODONE, sodium chloride  Assessment/ Plan:  [name] is a 74 y.o. white female with sleep apnea, dementia, coronary artery disease, osteoporosis, hypertension, hyperlipidemia, diabetes mellitus type II, congestive heart failure, depression, anxiety and hypothyroidism, who was admitted to [facility] on 06/12/2020 for Dehydration [E86.0] Hyperkalemia [E87.5] Lactic acidosis [E87.2] Normocytic anemia [D64.9] Prerenal azotemia [R79.89] Generalized weakness [R53.1] AKI (acute kidney injury) ([place]) [N17.9]  1. Acute renal failure with hyperkalemia and metabolic acidosis: baseline creatinine of 0.9, GFR of >60 on 04/22/20. History of proteinuria.  History consistent with prerenal azotemia.  No IV contrast exposure. Renal ultrasound with no obstruction No indication for dialysis.  - holding lisinopril  - discontinue IV fluids  2. Anemia with renal failure: hemoglobin 9.7. normocytic  3. Hypertension: 176/87 - elevated. Holding home dose of lisinopril. However suspect her blood pressure is elevated secondary to agitation. - Continue metoprolol - Consider restarting lisinopril  4. 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 Hemoglobin A1c of 8.4%.  - holding metformin.   5. Urinary tract infection: no growth on urine culture.  - empiric ceftriaxone.    LOS: 3 Tysean [place] 8/12/20212:12 PM

## 2020-06-16 LAB — CBC WITH DIFFERENTIAL/PLATELET
Abs Immature Granulocytes: 0.09 K/uL — ABNORMAL HIGH (ref 0.00–0.07)
Basophils Absolute: 0.1 K/uL (ref 0.0–0.1)
Basophils Relative: 1 %
Eosinophils Absolute: 0.2 K/uL (ref 0.0–0.5)
Eosinophils Relative: 2 %
HCT: 30.4 % — ABNORMAL LOW (ref 36.0–46.0)
Hemoglobin: 10.1 g/dL — ABNORMAL LOW (ref 12.0–15.0)
Immature Granulocytes: 1 %
Lymphocytes Relative: 25 %
Lymphs Abs: 1.9 K/uL (ref 0.7–4.0)
MCH: 29.2 pg (ref 26.0–34.0)
MCHC: 33.2 g/dL (ref 30.0–36.0)
MCV: 87.9 fL (ref 80.0–100.0)
Monocytes Absolute: 0.8 K/uL (ref 0.1–1.0)
Monocytes Relative: 11 %
Neutro Abs: 4.6 K/uL (ref 1.7–7.7)
Neutrophils Relative %: 60 %
Platelets: 140 K/uL — ABNORMAL LOW (ref 150–400)
RBC: 3.46 MIL/uL — ABNORMAL LOW (ref 3.87–5.11)
RDW: 15.7 % — ABNORMAL HIGH (ref 11.5–15.5)
WBC: 7.6 K/uL (ref 4.0–10.5)
nRBC: 0 % (ref 0.0–0.2)

## 2020-06-16 LAB — BASIC METABOLIC PANEL
Anion gap: 11 (ref 5–15)
BUN: 19 mg/dL (ref 8–23)
CO2: 25 mmol/L (ref 22–32)
Calcium: 8.8 mg/dL — ABNORMAL LOW (ref 8.9–10.3)
Chloride: 109 mmol/L (ref 98–111)
Creatinine, Ser: 0.97 mg/dL (ref 0.44–1.00)
GFR calc Af Amer: >60 mL/min (ref >60)
GFR calc non Af Amer: 58 mL/min — ABNORMAL LOW (ref >60)
Glucose, Bld: 188 mg/dL — ABNORMAL HIGH (ref 70–99)
Potassium: 3.5 mmol/L (ref 3.5–5.1)
Sodium: 145 mmol/L (ref 135–145)

## 2020-06-16 LAB — GLUCOSE, CAPILLARY
Glucose-Capillary: 173 mg/dL — ABNORMAL HIGH (ref 70–99)
Glucose-Capillary: 210 mg/dL — ABNORMAL HIGH (ref 70–99)

## 2020-06-16 MED ORDER — METOPROLOL SUCCINATE ER 25 MG PO TB24: 25.0000 mg | ORAL_TABLET | Freq: Two times a day (BID) | ORAL | 0 refills | Status: DC

## 2020-06-16 MED ORDER — LISINOPRIL 20 MG PO TABS
20.0000 mg | ORAL_TABLET | Freq: Every day | ORAL | Status: DC
  Administered 2020-06-16: 20 mg via ORAL
  Filled 2020-06-16: qty 1

## 2020-06-16 MED ORDER — CEPHALEXIN 250 MG PO CAPS: 250.0000 mg | ORAL_CAPSULE | Freq: Four times a day (QID) | ORAL | 0 refills | Status: AC

## 2020-06-16 MED ORDER — TOPIRAMATE 50 MG PO TABS: 50.0000 mg | ORAL_TABLET | Freq: Every day | ORAL | 0 refills | Status: DC

## 2020-06-16 MED ORDER — INSULIN GLARGINE 100 UNIT/ML ~~LOC~~ SOLN: 30.0000 [IU] | Freq: Every day | SUBCUTANEOUS | 11 refills | Status: DC

## 2020-06-16 MED ORDER — SALINE SPRAY 0.65 % NA SOLN: 1.0000 | NASAL | 0 refills | Status: DC | PRN

## 2020-06-16 NOTE — Progress Notes (Signed)
Inpatient Diabetes Program Recommendations  AACE/ADA: New Consensus Statement on Inpatient Glycemic Control (2015)  Target Ranges:  Prepandial:   less than 140 mg/dL      Peak postprandial:   less than 180 mg/dL (1-2 hours)      Critically ill patients:  140 - 180 mg/dL   Lab Results  Component Value Date   GLUCAP 173 (H) 06/16/2020   HGBA1C 8.4 (H) 06/12/2020    Review of Glycemic Control Results for [name], [name] (MRN [id number]) as of 06/16/2020 09:44  Ref. Range 06/15/2020 18:00 06/15/2020 21:42 06/16/2020 07:40  Glucose-Capillary Latest Ref Range: 70 - 99 mg/dL 269 (H) 241 (H) 173 (H)   Diabetes history: DM2 Outpatient Diabetes medications: Lantus 75 units QHS, Metformin 1000 mg BID Current orders for Inpatient glycemic control: Lantus 30 units QHS, Novolog 0-20 units TID with meals  Inpatient Diabetes Program Recommendations:    Insulin: Please consider increasing Lantus to 35 units QHS, adding Novolog 0-5 units QHS, and ordering Novolog 4 units TID with meals for meal coverage if patient eats at least 50% of meals.  Thanks, Bronson Curb, MSN, RNC-OB Diabetes Coordinator [phone] (8a-5p)

## 2020-06-16 NOTE — Care Management Important Message (Signed)
Important Message  Patient Details  Name: [name] MRN: [id number] Date of Birth: Jun 08, 1946   Medicare Important Message Given:  Yes  Reviewed with patient via room phone due to isolation status.  Copy mailed to patient's home address earlier in week.   Dannette Barbara 06/16/2020, 11:30 AM

## 2020-06-16 NOTE — Discharge Summary (Signed)
Physician Discharge Summary  [name] KPT:[id number] DOB: 14-Feb-1946 DOA: 06/12/2020  PCP: System, Pcp Not In  Admit date: 06/12/2020 Discharge date: 06/16/2020  Recommendations for Outpatient Follow-up:  1. Discharge to home to the care of her family 2. Follow up with PCP in 7-10 days. 3. Have cbc and chemistry checked on that PCP visit.  Discharge Diagnoses: Principal diagnosis is #1 1. AKI 2. Hyperkalemia 3. Chest pain 4. Delirium 5. Anxiety 6. Non-cardiac chest pain 7. Confusion 8. Debility  Discharge Condition: Fair  Disposition: Home with family supervision  Diet recommendation: Heart healthy  Filed Weights   06/14/20 0339 06/15/20 0332 06/16/20 0529  Weight: 125.9 kg 127.1 kg 125.7 kg    History of present illness:  [name] is a 74 y.o. female with medical history significant for morbid obesity, diabetes mellitus, hypertension, depression, GERD and anxiety who was brought in by EMS after her family called because she fell at home and was unable to get up.  Patient daughter stated every time she tried to get up and her knees buckled under her.  Patient also fell about 2 days prior to this admission but was able to get up and get around without difficulty.  Daughter states that she has been having visual hallucinations and has been seeing dead family members.  Patient is very forgetful and unable to provide any history at this time.  Her oral intake has been poor but she denies having any  vomiting or diarrhea.  She complains of nausea. I am unable to do a review of systems due to her mental status changes. Patient was hypotensive upon arrival to the ER and received IV fluid resuscitation with improvement in her blood pressure to 170 systolic from 65 systolic upon presentation. Labs reveal sodium of 138, potassium of 5.9, bicarb of 17, BUN of 101 compared to baseline of 21, creatinine of 4.50 compared to baseline of 1.04, anion gap of 12, lactic acid of  2.9. Cervical spine CT showed no acute abnormality. CT scan of the head done without contrast shows no acute findings. Chest x-ray reviewed by me shows cardiomegaly.  No pleural effusion Twelve-lead EKG reviewed by me shows sinus rhythm.  Hospital Course:  Whitney Rojero Warrickis a 74 y.o.femalewith medical history significant formorbid obesity,diabetes mellitus, hypertension, depression, GERD and anxiety who was brought in by EMS after her family called because she fell at home and was unable to get up.Herdaughter also reported thatpatient was having visual hallucinations at home. In the ED, she was hypotensive and work-up revealed acute kidney injury complicated by hyperkalemia.  The patient complained of chest pain this morning. She is at high risk for cardiovascular disease. MI was ruled out by EKG and enzyme criteria.  PT has stated that the patient is at high risk for falls and has recommended SNF. The patient and her family have refused SNF and any in home health workers. She will be discharged to their supervision.  Today's assessment: S: The patient is resting comfortably. No new complaints. O: Vitals:  Vitals:   06/16/20 0745 06/16/20 1155  BP: (!) 159/49 (!) 167/78  Pulse: 70 78  Resp: 18 18  Temp: 98.4 F (36.9 C) 98.4 F (36.9 C)  SpO2: 96% 96%   Exam:  Constitutional:  . The patient is awake, alert, and oriented x 3. No acute distress. Respiratory:  . No increased work of breathing. . No wheezes, rales, or rhonchi . No tactile fremitus Cardiovascular:  . Regular rate and  rhythm . No murmurs, ectopy, or gallups. . No lateral PMI. No thrills. Abdomen:  . Abdomen is soft, non-tender, non-distended . No hernias, masses, or organomegaly . Normoactive bowel sounds.  Musculoskeletal:  . No cyanosis, clubbing, or edema Skin:  . No rashes, lesions, ulcers . palpation of skin: no induration or nodules Neurologic:  . CN 2-12 intact . Sensation all 4  extremities intact Psychiatric:  . Mental status o Mood, affect appropriate o Orientation to person, place, time  . judgment and insight appear intact  Discharge Instructions  Discharge Instructions    Activity as tolerated - No restrictions   Complete by: As directed    Call MD for:  difficulty breathing, headache or visual disturbances   Complete by: As directed    Call MD for:  extreme fatigue   Complete by: As directed    Call MD for:  persistant dizziness or light-headedness   Complete by: As directed    Call MD for:  temperature >100.4   Complete by: As directed    Diet - low sodium heart healthy   Complete by: As directed    Discharge instructions   Complete by: As directed    Discharge to home to the care of her family Follow up with PCP in 7-10 days. Have cbc and chemistry checked on that PCP visit.   Increase activity slowly   Complete by: As directed      Allergies as of 06/16/2020   No Known Allergies     Medication List    STOP taking these medications   Lantus SoloStar 100 UNIT/ML Solostar Pen Generic drug: insulin glargine Replaced by: insulin glargine 100 UNIT/ML injection     TAKE these medications   aspirin EC 81 MG tablet Take 81 mg by mouth daily.   atorvastatin 40 MG tablet Commonly known as: LIPITOR Take 40 mg by mouth at bedtime.   busPIRone 30 MG tablet Commonly known as: BUSPAR Take 30 mg by mouth 2 (two) times daily.   cephALEXin 250 MG capsule Commonly known as: KEFLEX Take 1 capsule (250 mg total) by mouth every 6 (six) hours for 8 doses.   DULoxetine 60 MG capsule Commonly known as: CYMBALTA Take 60 mg by mouth daily.   fluticasone 50 MCG/ACT nasal spray Commonly known as: FLONASE Place 2 sprays into both nostrils daily as needed for allergies or rhinitis.   gabapentin 300 MG capsule Commonly known as: NEURONTIN Take 1 capsule (300 mg total) by mouth 3 (three) times daily.   insulin glargine 100 UNIT/ML  injection Commonly known as: LANTUS Inject 0.3 mLs (30 Units total) into the skin at bedtime. Replaces: Lantus SoloStar 100 UNIT/ML Solostar Pen   levothyroxine 75 MCG tablet Commonly known as: SYNTHROID Take 1 tablet (75 mcg total) by mouth daily before breakfast.   lisinopril 20 MG tablet Commonly known as: ZESTRIL Take 20 mg by mouth daily.   memantine 28 MG Cp24 24 hr capsule Commonly known as: NAMENDA XR Take 28 mg by mouth.   metFORMIN 500 MG 24 hr tablet Commonly known as: GLUCOPHAGE-XR Take 1,000 mg by mouth 2 (two) times daily.   metoprolol succinate 25 MG 24 hr tablet Commonly known as: TOPROL-XL Take 1 tablet (25 mg total) by mouth 2 (two) times daily. What changed:   medication strength  how much to take   nystatin powder Generic drug: nystatin Apply 1 application topically 2 (two) times daily. Apply to skin fold twice daily   oxyCODONE-acetaminophen 10-325 MG  tablet Commonly known as: PERCOCET Take 1 tablet by mouth every 6 (six) hours as needed for pain.   pantoprazole 40 MG tablet Commonly known as: PROTONIX Take 40 mg by mouth daily.   QUEtiapine 200 MG tablet Commonly known as: SEROQUEL Take 100-200 mg by mouth at bedtime.   sodium chloride 0.65 % Soln nasal spray Commonly known as: OCEAN Place 1 spray into both nostrils as needed for congestion.   topiramate 50 MG tablet Commonly known as: TOPAMAX Take 1 tablet (50 mg total) by mouth daily.   Viibryd 40 MG Tabs Generic drug: Vilazodone HCl Take by mouth.   Vitamin D3 25 MCG (1000 UT) Caps Take 1,000 Units by mouth daily.   zolpidem 10 MG tablet Commonly known as: AMBIEN Take 10 mg by mouth at bedtime as needed.      No Known Allergies  The results of significant diagnostics from this hospitalization (including imaging, microbiology, ancillary and laboratory) are listed below for reference.    Significant Diagnostic Studies: CT Head Wo Contrast  Result Date: 06/12/2020 CLINICAL  DATA:  Weakness and unable to walk for 2 days.  Neck pain. EXAM: CT HEAD WITHOUT CONTRAST CT CERVICAL SPINE WITHOUT CONTRAST TECHNIQUE: Multidetector CT imaging of the head and cervical spine was performed following the standard protocol without intravenous contrast. Multiplanar CT image reconstructions of the cervical spine were also generated. COMPARISON:  03/06/2020 FINDINGS: CT HEAD FINDINGS Brain: No evidence of acute infarction, hemorrhage, extra-axial collection, ventriculomegaly, or mass effect. Generalized cerebral atrophy. Periventricular white matter low attenuation likely secondary to microangiopathy. Vascular: Cerebrovascular atherosclerotic calcifications are noted. Skull: Negative for fracture or focal lesion. Sinuses/Orbits: Visualized portions of the orbits are unremarkable. Visualized portions of the paranasal sinuses are unremarkable. Visualized portions of the mastoid air cells are unremarkable. Other: None. CT CERVICAL SPINE FINDINGS Alignment: Normal. Skull base and vertebrae: No acute fracture. No primary bone lesion or focal pathologic process. Soft tissues and spinal canal: No prevertebral fluid or swelling. No visible canal hematoma. Disc levels: Degenerative disease with disc height loss at C5-6. Mild bilateral facet arthropathy throughout the cervical spine most severe at C3-4 and C4-5 on the left. Upper chest: Lung apices are clear. Other: No fluid collection or hematoma. IMPRESSION: 1. No acute intracranial pathology. 2. No acute osseous injury of the cervical spine. 3. Cervical spine spondylosis as described above. Electronically Signed   By: Kathreen Devoid   On: 06/12/2020 13:15   CT Cervical Spine Wo Contrast  Result Date: 06/12/2020 CLINICAL DATA:  Weakness and unable to walk for 2 days.  Neck pain. EXAM: CT HEAD WITHOUT CONTRAST CT CERVICAL SPINE WITHOUT CONTRAST TECHNIQUE: Multidetector CT imaging of the head and cervical spine was performed following the standard protocol  without intravenous contrast. Multiplanar CT image reconstructions of the cervical spine were also generated. COMPARISON:  03/06/2020 FINDINGS: CT HEAD FINDINGS Brain: No evidence of acute infarction, hemorrhage, extra-axial collection, ventriculomegaly, or mass effect. Generalized cerebral atrophy. Periventricular white matter low attenuation likely secondary to microangiopathy. Vascular: Cerebrovascular atherosclerotic calcifications are noted. Skull: Negative for fracture or focal lesion. Sinuses/Orbits: Visualized portions of the orbits are unremarkable. Visualized portions of the paranasal sinuses are unremarkable. Visualized portions of the mastoid air cells are unremarkable. Other: None. CT CERVICAL SPINE FINDINGS Alignment: Normal. Skull base and vertebrae: No acute fracture. No primary bone lesion or focal pathologic process. Soft tissues and spinal canal: No prevertebral fluid or swelling. No visible canal hematoma. Disc levels: Degenerative disease with disc height loss at  C5-6. Mild bilateral facet arthropathy throughout the cervical spine most severe at C3-4 and C4-5 on the left. Upper chest: Lung apices are clear. Other: No fluid collection or hematoma. IMPRESSION: 1. No acute intracranial pathology. 2. No acute osseous injury of the cervical spine. 3. Cervical spine spondylosis as described above. Electronically Signed   By: Kathreen Devoid   On: 06/12/2020 13:15   US RENAL  Result Date: 06/12/2020 CLINICAL DATA:  Acute kidney injury. EXAM: RENAL / URINARY TRACT ULTRASOUND COMPLETE COMPARISON:  Complete abdomen ultrasound dated 07/12/2008. Abdomen and pelvis CT dated 03/06/2020 FINDINGS: Right Kidney: Renal measurements: 11.0 x 7.1 x 6.0 = volume: 247 mL . Echogenicity within normal limits. No mass or hydronephrosis visualized. Left Kidney: Renal measurements: 12.1 x 6.4 x 5.6 cm = volume: 224 mL. Echogenicity within normal limits. No mass or hydronephrosis visualized. Bladder: Appears normal for  degree of bladder distention. Other: None. IMPRESSION: Normal examination. Electronically Signed   By: Claudie Revering M.D.   On: 06/12/2020 16:33   DG Chest Port 1 View  Result Date: 06/15/2020 CLINICAL DATA:  Chest pain EXAM: PORTABLE CHEST 1 VIEW COMPARISON:  06/12/2020 chest radiograph. FINDINGS: Stable cardiomediastinal silhouette with top-normal heart size. No pneumothorax. No pleural effusion. No overt pulmonary edema. Low lung volumes. Mild right basilar atelectasis is stable. IMPRESSION: Low lung volumes with mild right basilar atelectasis. Electronically Signed   By: Ilona Sorrel M.D.   On: 06/15/2020 09:19   DG Chest Portable 1 View  Result Date: 06/12/2020 CLINICAL DATA:  Weakness. EXAM: PORTABLE CHEST 1 VIEW COMPARISON:  03/06/2020 FINDINGS: Stable cardiomegaly. Low lung volumes. Central venous congestion. No focal infiltrate. No pneumothorax. IMPRESSION: Cardiomegaly and central venous congestion.  Low lung volumes. Electronically Signed   By: Suzy Bouchard M.D.   On: 06/12/2020 13:58    Microbiology: Recent Results (from the past 240 hour(s))  SARS Coronavirus 2 by RT PCR (hospital order, performed in [facility] hospital lab) Nasopharyngeal Nasopharyngeal Swab     Status: None   Collection Time: 06/12/20 11:49 AM   Specimen: Nasopharyngeal Swab  Result Value Ref Range Status   SARS Coronavirus 2 NEGATIVE NEGATIVE Final    Comment: (NOTE) SARS-CoV-2 target nucleic acids are NOT DETECTED.  The SARS-CoV-2 RNA is generally detectable in upper and lower respiratory specimens during the acute phase of infection. The lowest concentration of SARS-CoV-2 viral copies this assay can detect is 250 copies / mL. A negative result does not preclude SARS-CoV-2 infection and should not be used as the sole basis for treatment or other patient management decisions.  A negative result may occur with improper specimen collection / handling, submission of specimen other than nasopharyngeal swab,  presence of viral mutation(s) within the areas targeted by this assay, and inadequate number of viral copies (<250 copies / mL). A negative result must be combined with clinical observations, patient history, and epidemiological information.  Fact Sheet for Patients:   StrictlyIdeas.no  Fact Sheet for Healthcare Providers: BankingDealers.co.za  This test is not yet approved or  cleared by the [place] FDA and has been authorized for detection and/or diagnosis of SARS-CoV-2 by FDA under an Emergency Use Authorization (EUA).  This EUA will remain in effect (meaning this test can be used) for the duration of the COVID-19 declaration under Section 564(b)(1) of the Act, 21 U.S.C. section 360bbb-3(b)(1), unless the authorization is terminated or revoked sooner.  Performed at [facility], [address], [place], [place] [address]   Urine Culture  Status: Abnormal   Collection Time: 06/12/20 11:49 AM   Specimen: Urine, Random  Result Value Ref Range Status   Specimen Description   Final    URINE, RANDOM Performed at [facility], [address] [place], [place] [address]    Special Requests   Final    NONE Performed at [facility], [address] [place], [place] [address]    Culture >=100,000 COLONIES/mL KLEBSIELLA PNEUMONIAE (A)  Final   Report Status 06/15/2020 FINAL  Final   Organism ID, Bacteria KLEBSIELLA PNEUMONIAE (A)  Final      Susceptibility   Klebsiella pneumoniae - MIC*    AMPICILLIN >=32 RESISTANT Resistant     CEFAZOLIN <=4 SENSITIVE Sensitive     CEFTRIAXONE <=0.25 SENSITIVE Sensitive     CIPROFLOXACIN <=0.25 SENSITIVE Sensitive     GENTAMICIN <=1 SENSITIVE Sensitive     IMIPENEM <=0.25 SENSITIVE Sensitive     NITROFURANTOIN 64 INTERMEDIATE Intermediate     TRIMETH/SULFA <=20 SENSITIVE Sensitive     AMPICILLIN/SULBACTAM 8 SENSITIVE Sensitive     PIP/TAZO 8 SENSITIVE  Sensitive     * >=100,000 COLONIES/mL KLEBSIELLA PNEUMONIAE  Blood culture (routine x 2)     Status: None (Preliminary result)   Collection Time: 06/12/20  7:05 PM   Specimen: BLOOD  Result Value Ref Range Status   Specimen Description BLOOD LEFT HAND  Final   Special Requests   Final    BOTTLES DRAWN AEROBIC AND ANAEROBIC Blood Culture results may not be optimal due to an excessive volume of blood received in culture bottles   Culture   Final    NO GROWTH 4 DAYS Performed at [facility], [address] [place], [place] [address]    Report Status PENDING  Incomplete  Blood culture (routine x 2)     Status: None (Preliminary result)   Collection Time: 06/12/20  7:16 PM   Specimen: BLOOD LEFT HAND  Result Value Ref Range Status   Specimen Description BLOOD LEFT HAND  Final   Special Requests   Final    BOTTLES DRAWN AEROBIC AND ANAEROBIC Blood Culture adequate volume   Culture   Final    NO GROWTH 4 DAYS Performed at [facility], [address] [place],  [address]    Report Status PENDING  Incomplete     Labs: Basic Metabolic Panel: Recent Labs  Lab 06/12/20 1149 06/13/20 0524 06/14/20 0526 06/15/20 0616 06/16/20 0504  NA 138 140 144 146* 145  K 5.9* 4.7 4.5 3.7 3.5  CL 109 107 108 110 109  CO2 17* 19* 25 26 25   GLUCOSE 111* 213* 237* 215* 188*  BUN 101* 98* 60* 29* 19  CREATININE 4.50* 3.46* 1.70* 1.07* 0.97  CALCIUM 8.5* 9.0 9.2 8.8* 8.8*  PHOS  --   --  2.9  --   --    Liver Function Tests: Recent Labs  Lab 06/14/20 0526  ALBUMIN 4.0   No results for input(s): LIPASE, AMYLASE in the last 168 hours. No results for input(s): AMMONIA in the last 168 hours. CBC: Recent Labs  Lab 06/12/20 1149 06/13/20 0524 06/14/20 0526 06/15/20 0616 06/16/20 0504  WBC 8.4 6.2 6.3 6.2 7.6  NEUTROABS  --   --  4.0 3.8 4.6  HGB 10.2* 10.4* 10.5* 9.7* 10.1*  HCT 32.6* 33.5* 32.6* 30.0* 30.4*  MCV 92.4 90.1 87.9 88.5 87.9  PLT 128* 131* 142*  133* 140*   Cardiac Enzymes: Recent Labs  Lab  06/12/20 1905  CKTOTAL 387*   BNP: BNP (last 3 results) Recent Labs    03/06/20 0725  BNP 36.0    ProBNP (last 3 results) No results for input(s): PROBNP in the last 8760 hours.  CBG: Recent Labs  Lab 06/15/20 1244 06/15/20 1800 06/15/20 2142 06/16/20 0740 06/16/20 1151  GLUCAP 194* 269* 241* 173* 210*    Principal Problem:   AKI (acute kidney injury) ([place]) Active Problems:   Diabetes mellitus with complication (HCC)   Essential hypertension   CAD (coronary artery disease)   Acute metabolic encephalopathy   Morbid obesity with BMI of 45.0-49.9, adult (HCC)   Lactic acidosis   Hyperkalemia   Time coordinating discharge: 38 minutes. Signed:        Shaughnessy Gethers, DO [facility]  06/16/2020, 8:49 PM

## 2020-06-16 NOTE — Progress Notes (Signed)
[facility]  ROUNDING NOTE   Subjective:   Drinking smoothie from [employer].   UOP 2524mL.  Creatine 0.97 (1.07)  Objective:  Vital signs in last 24 hours:  Temp:  [98.4 F (36.9 C)-100.2 F (37.9 C)] 98.4 F (36.9 C) (08/13 0745) Pulse Rate:  [70-91] 70 (08/13 0745) Resp:  [18-19] 18 (08/13 0745) BP: (156-184)/(49-93) 159/49 (08/13 0745) SpO2:  [93 %-97 %] 96 % (08/13 0745) Weight:  [125.7 kg] 125.7 kg (08/13 0529)  Weight change: -1.361 kg Filed Weights   06/14/20 0339 06/15/20 0332 06/16/20 0529  Weight: 125.9 kg 127.1 kg 125.7 kg    Intake/Output: I/O last 3 completed shifts: In: 1365.9 [P.O.:360; I.V.:905.9; IV Piggyback:100] Out: 3100 [Urine:3100]   Intake/Output this shift:  Total I/O In: -  Out: 150 [Urine:150]  Physical Exam: General: NAD, sitting in bed  Head: Normocephalic, atraumatic. Moist oral mucosal membranes  Eyes: Anicteric, PERRL  Neck: Supple, trachea midline  Lungs:  Clear to auscultation  Heart: regular  Abdomen:  Soft, nontender, obese  Extremities:  no peripheral edema.  Neurologic: Nonfocal, moving all four extremities  Skin: No lesions        Basic Metabolic Panel: Recent Labs  Lab 06/12/20 1149 06/12/20 1149 06/13/20 0524 06/13/20 0524 06/14/20 0526 06/15/20 0616 06/16/20 0504  NA 138  --  140  --  144 146* 145  K 5.9*  --  4.7  --  4.5 3.7 3.5  CL 109  --  107  --  108 110 109  CO2 17*  --  19*  --  25 26 25   GLUCOSE 111*  --  213*  --  237* 215* 188*  BUN 101*  --  98*  --  60* 29* 19  CREATININE 4.50*  --  3.46*  --  1.70* 1.07* 0.97  CALCIUM 8.5*   < > 9.0   < > 9.2 8.8* 8.8*  PHOS  --   --   --   --  2.9  --   --    < > = values in this interval not displayed.    Liver Function Tests: Recent Labs  Lab 06/14/20 0526  ALBUMIN 4.0   No results for input(s): LIPASE, AMYLASE in the last 168 hours. No results for input(s): AMMONIA in the last 168 hours.  CBC: Recent Labs  Lab 06/12/20 1149  06/13/20 0524 06/14/20 0526 06/15/20 0616 06/16/20 0504  WBC 8.4 6.2 6.3 6.2 7.6  NEUTROABS  --   --  4.0 3.8 4.6  HGB 10.2* 10.4* 10.5* 9.7* 10.1*  HCT 32.6* 33.5* 32.6* 30.0* 30.4*  MCV 92.4 90.1 87.9 88.5 87.9  PLT 128* 131* 142* 133* 140*    Cardiac Enzymes: Recent Labs  Lab 06/12/20 1905  CKTOTAL 387*    BNP: Invalid input(s): POCBNP  CBG: Recent Labs  Lab 06/15/20 0759 06/15/20 1244 06/15/20 1800 06/15/20 2142 06/16/20 0740  GLUCAP 227* 194* 269* 241* 173*    Microbiology: Results for orders placed or performed during the hospital encounter of 06/12/20  SARS Coronavirus 2 by RT PCR (hospital order, performed in [facility] hospital lab) Nasopharyngeal Nasopharyngeal Swab     Status: None   Collection Time: 06/12/20 11:49 AM   Specimen: Nasopharyngeal Swab  Result Value Ref Range Status   SARS Coronavirus 2 NEGATIVE NEGATIVE Final    Comment: (NOTE) SARS-CoV-2 target nucleic acids are NOT DETECTED.  The SARS-CoV-2 RNA is generally detectable in upper and lower respiratory specimens during the acute  phase of infection. The lowest concentration of SARS-CoV-2 viral copies this assay can detect is 250 copies / mL. A negative result does not preclude SARS-CoV-2 infection and should not be used as the sole basis for treatment or other patient management decisions.  A negative result may occur with improper specimen collection / handling, submission of specimen other than nasopharyngeal swab, presence of viral mutation(s) within the areas targeted by this assay, and inadequate number of viral copies (<250 copies / mL). A negative result must be combined with clinical observations, patient history, and epidemiological information.  Fact Sheet for Patients:   StrictlyIdeas.no  Fact Sheet for Healthcare Providers: BankingDealers.co.za  This test is not yet approved or  cleared by the [place] FDA and has been  authorized for detection and/or diagnosis of SARS-CoV-2 by FDA under an Emergency Use Authorization (EUA).  This EUA will remain in effect (meaning this test can be used) for the duration of the COVID-19 declaration under Section 564(b)(1) of the Act, 21 U.S.C. section 360bbb-3(b)(1), unless the authorization is terminated or revoked sooner.  Performed at [facility], [address] [place], [place] [address]   Urine Culture     Status: Abnormal   Collection Time: 06/12/20 11:49 AM   Specimen: Urine, Random  Result Value Ref Range Status   Specimen Description   Final    URINE, RANDOM Performed at [facility], [address] [place], [place] [address]    Special Requests   Final    NONE Performed at [facility], [address] [place], [place] [address]    Culture >=100,000 COLONIES/mL KLEBSIELLA PNEUMONIAE (A)  Final   Report Status 06/15/2020 FINAL  Final   Organism ID, Bacteria KLEBSIELLA PNEUMONIAE (A)  Final      Susceptibility   Klebsiella pneumoniae - MIC*    AMPICILLIN >=32 RESISTANT Resistant     CEFAZOLIN <=4 SENSITIVE Sensitive     CEFTRIAXONE <=0.25 SENSITIVE Sensitive     CIPROFLOXACIN <=0.25 SENSITIVE Sensitive     GENTAMICIN <=1 SENSITIVE Sensitive     IMIPENEM <=0.25 SENSITIVE Sensitive     NITROFURANTOIN 64 INTERMEDIATE Intermediate     TRIMETH/SULFA <=20 SENSITIVE Sensitive     AMPICILLIN/SULBACTAM 8 SENSITIVE Sensitive     PIP/TAZO 8 SENSITIVE Sensitive     * >=100,000 COLONIES/mL KLEBSIELLA PNEUMONIAE  Blood culture (routine x 2)     Status: None (Preliminary result)   Collection Time: 06/12/20  7:05 PM   Specimen: BLOOD  Result Value Ref Range Status   Specimen Description BLOOD LEFT HAND  Final   Special Requests   Final    BOTTLES DRAWN AEROBIC AND ANAEROBIC Blood Culture results may not be optimal due to an excessive volume of blood received in culture bottles   Culture   Final    NO GROWTH 4 DAYS Performed at  [facility], [address] [place], [place] [address]    Report Status PENDING  Incomplete  Blood culture (routine x 2)     Status: None (Preliminary result)   Collection Time: 06/12/20  7:16 PM   Specimen: BLOOD LEFT HAND  Result Value Ref Range Status   Specimen Description BLOOD LEFT HAND  Final   Special Requests   Final    BOTTLES DRAWN AEROBIC AND ANAEROBIC Blood Culture adequate volume   Culture   Final    NO GROWTH 4 DAYS Performed at [facility], [address], [place], [place] [address]  Report Status PENDING  Incomplete    Coagulation Studies: No results for input(s): LABPROT, INR in the last 72 hours.  Urinalysis: No results for input(s): COLORURINE, LABSPEC, PHURINE, GLUCOSEU, HGBUR, BILIRUBINUR, KETONESUR, PROTEINUR, UROBILINOGEN, NITRITE, LEUKOCYTESUR in the last 72 hours.  Invalid input(s): APPERANCEUR    Imaging: DG Chest Port 1 View  Result Date: 06/15/2020 CLINICAL DATA:  Chest pain EXAM: PORTABLE CHEST 1 VIEW COMPARISON:  06/12/2020 chest radiograph. FINDINGS: Stable cardiomediastinal silhouette with top-normal heart size. No pneumothorax. No pleural effusion. No overt pulmonary edema. Low lung volumes. Mild right basilar atelectasis is stable. IMPRESSION: Low lung volumes with mild right basilar atelectasis. Electronically Signed   By: Ilona Sorrel M.D.   On: 06/15/2020 09:19     Medications:    . albuterol  7.5 mg/hr Nebulization Once  . aspirin EC  81 mg Oral Daily  . atorvastatin  40 mg Oral QHS  . busPIRone  30 mg Oral BID  . cephALEXin  250 mg Oral Q6H  . cholecalciferol  1,000 Units Oral Daily  . cyclobenzaprine  5 mg Oral Daily  . DULoxetine  60 mg Oral Daily  . enoxaparin (LOVENOX) injection  40 mg Subcutaneous Q12H  . insulin aspart  0-20 Units Subcutaneous TID WC  . insulin glargine  30 Units Subcutaneous QHS  . levothyroxine  75 mcg Oral Q0600  . memantine  28 mg Oral Daily  . QUEtiapine  100 mg Oral QHS  .  topiramate  50 mg Oral Daily  . Vilazodone HCl  40 mg Oral Daily   acetaminophen **OR** acetaminophen, fluticasone, LORazepam, ondansetron **OR** ondansetron (ZOFRAN) IV, oxyCODONE-acetaminophen **AND** oxyCODONE, sodium chloride  Assessment/ Plan:  Ms. [name] is a 74 y.o. white female with sleep apnea, dementia, coronary artery disease, osteoporosis, hypertension, hyperlipidemia, diabetes mellitus type II, congestive heart failure, depression, anxiety and hypothyroidism, who was admitted to [facility] on 06/12/2020 for Dehydration [E86.0] Hyperkalemia [E87.5] Lactic acidosis [E87.2] Normocytic anemia [D64.9] Prerenal azotemia [R79.89] Generalized weakness [R53.1] AKI (acute kidney injury) ([place]) [N17.9]  1. Acute renal failure with hyperkalemia and metabolic acidosis: baseline creatinine of 0.9, GFR of >60 on 04/22/20. History of proteinuria.  History consistent with prerenal azotemia.  No IV contrast exposure. Renal ultrasound with no obstruction No indication for dialysis.  - Restart lisinopril  - encourage PO intake.   2. Anemia with renal failure: hemoglobin 10.1. normocytic  3. Hypertension: 176/87 - elevated. Holding home dose of lisinopril. However suspect her blood pressure is elevated secondary to agitation. - Continue metoprolol - Restart lisinopril as above  4. 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 Hemoglobin A1c of 8.4%.  - holding metformin.   5. Urinary tract infection: no growth on urine culture.  - cephalexin.   Please scheduled patient for outpatient nephrology follow up.    LOS: 4 Aden Sek 8/13/202111:03 AM

## 2020-06-17 LAB — CULTURE, BLOOD (ROUTINE X 2)
Culture: NO GROWTH
Culture: NO GROWTH
Special Requests: ADEQUATE

## 2020-08-09 ENCOUNTER — Other Ambulatory Visit: Payer: Self-pay | Admitting: Nurse Practitioner

## 2020-08-09 DIAGNOSIS — M5136 Other intervertebral disc degeneration, lumbar region: Secondary | ICD-10-CM

## 2020-08-22 DIAGNOSIS — E872 Acidosis, unspecified: Secondary | ICD-10-CM | POA: Insufficient documentation

## 2020-08-31 ENCOUNTER — Ambulatory Visit
Admission: RE | Admit: 2020-08-31 | Discharge: 2020-08-31 | Disposition: A | Discharge status: Home / Self Care | Payer: Medicare Other | Source: Ambulatory Visit | Attending: Nurse Practitioner | Admitting: Nurse Practitioner

## 2020-08-31 ENCOUNTER — Other Ambulatory Visit: Payer: Self-pay

## 2020-08-31 DIAGNOSIS — M5136 Other intervertebral disc degeneration, lumbar region: Secondary | ICD-10-CM

## 2020-08-31 MED ORDER — GADOBENATE DIMEGLUMINE 529 MG/ML IV SOLN
20.0000 mL | Freq: Once | INTRAVENOUS | Status: AC | PRN
  Administered 2020-08-31: 20 mL via INTRAVENOUS

## 2020-09-12 DIAGNOSIS — N390 Urinary tract infection, site not specified: Secondary | ICD-10-CM | POA: Insufficient documentation

## 2020-11-20 ENCOUNTER — Ambulatory Visit: Payer: Self-pay | Admitting: Urology

## 2020-12-11 ENCOUNTER — Ambulatory Visit: Payer: Medicare Other | Admitting: Urology

## 2021-01-01 ENCOUNTER — Ambulatory Visit: Payer: Medicare Other | Admitting: Urology

## 2021-01-23 ENCOUNTER — Other Ambulatory Visit: Payer: Self-pay | Admitting: Neurosurgery

## 2021-02-05 ENCOUNTER — Ambulatory Visit: Payer: Medicare Other | Admitting: Urology

## 2021-02-07 ENCOUNTER — Other Ambulatory Visit
Admission: RE | Admit: 2021-02-07 | Discharge: 2021-02-07 | Disposition: A | Discharge status: Home / Self Care | Payer: Medicare Other | Source: Ambulatory Visit | Attending: Neurosurgery | Admitting: Neurosurgery

## 2021-02-07 ENCOUNTER — Other Ambulatory Visit: Payer: Self-pay

## 2021-02-07 DIAGNOSIS — Z20822 Contact with and (suspected) exposure to covid-19: Secondary | ICD-10-CM | POA: Diagnosis not present

## 2021-02-07 DIAGNOSIS — Z01812 Encounter for preprocedural laboratory examination: Secondary | ICD-10-CM | POA: Insufficient documentation

## 2021-02-07 LAB — SARS CORONAVIRUS 2 (TAT 6-24 HRS): SARS Coronavirus 2: NEGATIVE

## 2021-02-08 ENCOUNTER — Encounter (HOSPITAL_COMMUNITY): Payer: Self-pay | Admitting: Neurosurgery

## 2021-02-08 NOTE — Progress Notes (Addendum)
I spoke with [name], she denies chest pain or shortness of breath. [name] was tested for Covid on 02/07/21 and has been home with daughter and patient's caregiver. [name] answered all the questions , she wanted her caregiver , "[name]" to take the information. I began to tell [name] what  medications [name] should take in am, his response, "no kidding".  I name 4 , [name] said, "no kidding."  I asked [name] tell me what medications [name] should take in the am, he could not tell me. [name] said. "I know what medications she takes."  [name] then went to the pill bottles, I called the medications [name] should take in am.  [name] asked me if patient should take Lisinopril 3 times. I asked [name] if he is a family or a paid employee, , [name] said was upset and cursed at me. I explained that I am not convinced that he knows what medication [name] should take tomorrow morning, and I am  trying to keep patient safe and I need to know that someone knows what to do  for patient to prepare for surgery. I gave [name] instructions that patient needs a shower or a through wash up up in am.  [name] said that [name] had a wash up today and will not have one in am.  I said it is very important for patient to have had a shower or a wash up in am.  [name] came back to the phone, I read the medications she should take in am, Patients daughter  came to the phone and I gave her the directions.   Cardiologist- Dr. Saralyn Pilar - with [facility].  Nephrologist- at [facility]. In [place]

## 2021-02-09 ENCOUNTER — Inpatient Hospital Stay (HOSPITAL_COMMUNITY)
Admission: RE | Admit: 2021-02-09 | Discharge: 2021-02-12 | DRG: 454 | Disposition: A | Discharge status: Home Health | Payer: Medicare Other | Attending: Neurosurgery | Admitting: Neurosurgery

## 2021-02-09 ENCOUNTER — Ambulatory Visit (HOSPITAL_COMMUNITY): Payer: Medicare Other | Admitting: Anesthesiology

## 2021-02-09 ENCOUNTER — Ambulatory Visit (HOSPITAL_COMMUNITY): Payer: Medicare Other

## 2021-02-09 ENCOUNTER — Other Ambulatory Visit: Payer: Self-pay

## 2021-02-09 ENCOUNTER — Ambulatory Visit (HOSPITAL_COMMUNITY)
Admission: RE | Disposition: A | Discharge status: Home Health | Payer: Self-pay | Source: Home / Self Care | Attending: Neurosurgery

## 2021-02-09 DIAGNOSIS — Z7982 Long term (current) use of aspirin: Secondary | ICD-10-CM

## 2021-02-09 DIAGNOSIS — I951 Orthostatic hypotension: Secondary | ICD-10-CM | POA: Diagnosis not present

## 2021-02-09 DIAGNOSIS — Z79899 Other long term (current) drug therapy: Secondary | ICD-10-CM

## 2021-02-09 DIAGNOSIS — Z981 Arthrodesis status: Secondary | ICD-10-CM

## 2021-02-09 DIAGNOSIS — I130 Hypertensive heart and chronic kidney disease with heart failure and stage 1 through stage 4 chronic kidney disease, or unspecified chronic kidney disease: Secondary | ICD-10-CM | POA: Diagnosis present

## 2021-02-09 DIAGNOSIS — G894 Chronic pain syndrome: Secondary | ICD-10-CM | POA: Diagnosis present

## 2021-02-09 DIAGNOSIS — Z79890 Hormone replacement therapy (postmenopausal): Secondary | ICD-10-CM

## 2021-02-09 DIAGNOSIS — M48061 Spinal stenosis, lumbar region without neurogenic claudication: Secondary | ICD-10-CM | POA: Diagnosis present

## 2021-02-09 DIAGNOSIS — Z6841 Body Mass Index (BMI) 40.0 and over, adult: Secondary | ICD-10-CM

## 2021-02-09 DIAGNOSIS — G4730 Sleep apnea, unspecified: Secondary | ICD-10-CM | POA: Diagnosis present

## 2021-02-09 DIAGNOSIS — Z419 Encounter for procedure for purposes other than remedying health state, unspecified: Secondary | ICD-10-CM

## 2021-02-09 DIAGNOSIS — E1122 Type 2 diabetes mellitus with diabetic chronic kidney disease: Secondary | ICD-10-CM | POA: Diagnosis present

## 2021-02-09 DIAGNOSIS — M4310 Spondylolisthesis, site unspecified: Secondary | ICD-10-CM

## 2021-02-09 DIAGNOSIS — G8929 Other chronic pain: Secondary | ICD-10-CM

## 2021-02-09 DIAGNOSIS — F419 Anxiety disorder, unspecified: Secondary | ICD-10-CM | POA: Diagnosis present

## 2021-02-09 DIAGNOSIS — M47817 Spondylosis without myelopathy or radiculopathy, lumbosacral region: Secondary | ICD-10-CM | POA: Diagnosis present

## 2021-02-09 DIAGNOSIS — N189 Chronic kidney disease, unspecified: Secondary | ICD-10-CM | POA: Diagnosis present

## 2021-02-09 DIAGNOSIS — Z20822 Contact with and (suspected) exposure to covid-19: Secondary | ICD-10-CM | POA: Diagnosis present

## 2021-02-09 DIAGNOSIS — Z90710 Acquired absence of both cervix and uterus: Secondary | ICD-10-CM

## 2021-02-09 DIAGNOSIS — Z794 Long term (current) use of insulin: Secondary | ICD-10-CM

## 2021-02-09 DIAGNOSIS — F32A Depression, unspecified: Secondary | ICD-10-CM | POA: Diagnosis present

## 2021-02-09 DIAGNOSIS — M5117 Intervertebral disc disorders with radiculopathy, lumbosacral region: Secondary | ICD-10-CM | POA: Diagnosis present

## 2021-02-09 DIAGNOSIS — Z7984 Long term (current) use of oral hypoglycemic drugs: Secondary | ICD-10-CM

## 2021-02-09 DIAGNOSIS — M545 Low back pain, unspecified: Secondary | ICD-10-CM

## 2021-02-09 DIAGNOSIS — R569 Unspecified convulsions: Secondary | ICD-10-CM | POA: Diagnosis present

## 2021-02-09 DIAGNOSIS — I509 Heart failure, unspecified: Secondary | ICD-10-CM | POA: Diagnosis present

## 2021-02-09 DIAGNOSIS — E785 Hyperlipidemia, unspecified: Secondary | ICD-10-CM | POA: Diagnosis present

## 2021-02-09 DIAGNOSIS — M4317 Spondylolisthesis, lumbosacral region: Secondary | ICD-10-CM | POA: Diagnosis present

## 2021-02-09 DIAGNOSIS — Z96659 Presence of unspecified artificial knee joint: Secondary | ICD-10-CM | POA: Diagnosis present

## 2021-02-09 DIAGNOSIS — K219 Gastro-esophageal reflux disease without esophagitis: Secondary | ICD-10-CM | POA: Diagnosis present

## 2021-02-09 DIAGNOSIS — M4807 Spinal stenosis, lumbosacral region: Principal | ICD-10-CM | POA: Diagnosis present

## 2021-02-09 DIAGNOSIS — Z79891 Long term (current) use of opiate analgesic: Secondary | ICD-10-CM

## 2021-02-09 HISTORY — DX: Chronic kidney disease, unspecified: N18.9

## 2021-02-09 HISTORY — DX: Unspecified convulsions: R56.9

## 2021-02-09 HISTORY — DX: Personal history of other medical treatment: Z92.89

## 2021-02-09 HISTORY — DX: Panic disorder (episodic paroxysmal anxiety): F41.0

## 2021-02-09 LAB — CBC WITH DIFFERENTIAL/PLATELET
Abs Immature Granulocytes: 0.09 10*3/uL — ABNORMAL HIGH (ref 0.00–0.07)
Basophils Absolute: 0.1 10*3/uL (ref 0.0–0.1)
Basophils Relative: 1 %
Eosinophils Absolute: 0.5 10*3/uL (ref 0.0–0.5)
Eosinophils Relative: 7 %
HCT: 33.4 % — ABNORMAL LOW (ref 36.0–46.0)
Hemoglobin: 10.3 g/dL — ABNORMAL LOW (ref 12.0–15.0)
Immature Granulocytes: 1 %
Lymphocytes Relative: 26 %
Lymphs Abs: 1.9 10*3/uL (ref 0.7–4.0)
MCH: 29.7 pg (ref 26.0–34.0)
MCHC: 30.8 g/dL (ref 30.0–36.0)
MCV: 96.3 fL (ref 80.0–100.0)
Monocytes Absolute: 0.8 10*3/uL (ref 0.1–1.0)
Monocytes Relative: 10 %
Neutro Abs: 4 10*3/uL (ref 1.7–7.7)
Neutrophils Relative %: 55 %
Platelets: 142 10*3/uL — ABNORMAL LOW (ref 150–400)
RBC: 3.47 MIL/uL — ABNORMAL LOW (ref 3.87–5.11)
RDW: 14.6 % (ref 11.5–15.5)
WBC: 7.4 10*3/uL (ref 4.0–10.5)
nRBC: 0 % (ref 0.0–0.2)

## 2021-02-09 LAB — GLUCOSE, CAPILLARY
Glucose-Capillary: 123 mg/dL — ABNORMAL HIGH (ref 70–99)
Glucose-Capillary: 202 mg/dL — ABNORMAL HIGH (ref 70–99)
Glucose-Capillary: 244 mg/dL — ABNORMAL HIGH (ref 70–99)
Glucose-Capillary: 262 mg/dL — ABNORMAL HIGH (ref 70–99)

## 2021-02-09 LAB — SURGICAL PCR SCREEN
MRSA, PCR: NEGATIVE
Staphylococcus aureus: NEGATIVE

## 2021-02-09 LAB — BASIC METABOLIC PANEL
Anion gap: 9 (ref 5–15)
BUN: 21 mg/dL (ref 8–23)
CO2: 21 mmol/L — ABNORMAL LOW (ref 22–32)
Calcium: 9.3 mg/dL (ref 8.9–10.3)
Chloride: 109 mmol/L (ref 98–111)
Creatinine, Ser: 1.29 mg/dL — ABNORMAL HIGH (ref 0.44–1.00)
GFR, Estimated: 44 mL/min — ABNORMAL LOW (ref >60)
Glucose, Bld: 152 mg/dL — ABNORMAL HIGH (ref 70–99)
Potassium: 5 mmol/L (ref 3.5–5.1)
Sodium: 139 mmol/L (ref 135–145)

## 2021-02-09 LAB — TYPE AND SCREEN
ABO/RH(D): B POS
Antibody Screen: NEGATIVE

## 2021-02-09 LAB — HEMOGLOBIN A1C
Hgb A1c MFr Bld: 6.6 % — ABNORMAL HIGH (ref 4.8–5.6)
Mean Plasma Glucose: 142.72 mg/dL

## 2021-02-09 SURGERY — POSTERIOR LUMBAR FUSION 1 LEVEL
Anesthesia: General | Site: Spine Lumbar

## 2021-02-09 MED ORDER — BUPIVACAINE HCL (PF) 0.25 % IJ SOLN
INTRAMUSCULAR | Status: AC
  Filled 2021-02-09: qty 30

## 2021-02-09 MED ORDER — CHLORHEXIDINE GLUCONATE CLOTH 2 % EX PADS: 6.0000 | MEDICATED_PAD | Freq: Once | CUTANEOUS | Status: DC

## 2021-02-09 MED ORDER — THROMBIN 20000 UNITS EX KIT
PACK | CUTANEOUS | Status: AC
  Filled 2021-02-09: qty 1

## 2021-02-09 MED ORDER — DIAZEPAM 5 MG PO TABS
5.0000 mg | ORAL_TABLET | Freq: Four times a day (QID) | ORAL | Status: DC | PRN
  Administered 2021-02-09 (×2): 5 mg via ORAL
  Filled 2021-02-09 (×2): qty 1

## 2021-02-09 MED ORDER — VANCOMYCIN HCL 1000 MG IV SOLR
INTRAVENOUS | Status: AC
  Filled 2021-02-09: qty 1000

## 2021-02-09 MED ORDER — ONDANSETRON HCL 4 MG PO TABS: 4.0000 mg | ORAL_TABLET | Freq: Four times a day (QID) | ORAL | Status: DC | PRN

## 2021-02-09 MED ORDER — SODIUM CHLORIDE 0.9% FLUSH: 3.0000 mL | INTRAVENOUS | Status: DC | PRN

## 2021-02-09 MED ORDER — SUGAMMADEX SODIUM 200 MG/2ML IV SOLN
INTRAVENOUS | Status: DC | PRN
  Administered 2021-02-09 (×2): 100 mg via INTRAVENOUS

## 2021-02-09 MED ORDER — PHENYLEPHRINE HCL-NACL 10-0.9 MG/250ML-% IV SOLN
INTRAVENOUS | Status: DC | PRN
  Administered 2021-02-09: 50 ug/min via INTRAVENOUS

## 2021-02-09 MED ORDER — 0.9 % SODIUM CHLORIDE (POUR BTL) OPTIME
TOPICAL | Status: DC | PRN
  Administered 2021-02-09: 1000 mL

## 2021-02-09 MED ORDER — PROMETHAZINE HCL 25 MG/ML IJ SOLN: 6.2500 mg | INTRAMUSCULAR | Status: DC | PRN

## 2021-02-09 MED ORDER — OXYCODONE HCL 5 MG/5ML PO SOLN: 5.0000 mg | Freq: Once | ORAL | Status: DC | PRN

## 2021-02-09 MED ORDER — BUPIVACAINE HCL (PF) 0.25 % IJ SOLN
INTRAMUSCULAR | Status: DC | PRN
  Administered 2021-02-09: 20 mL

## 2021-02-09 MED ORDER — INSULIN GLARGINE 100 UNIT/ML ~~LOC~~ SOLN
72.0000 [IU] | Freq: Every day | SUBCUTANEOUS | Status: DC
  Administered 2021-02-09 – 2021-02-11 (×3): 72 [IU] via SUBCUTANEOUS
  Filled 2021-02-09 (×6): qty 0.72

## 2021-02-09 MED ORDER — OXYCODONE HCL 5 MG PO TABS: 10.0000 mg | ORAL_TABLET | Freq: Once | ORAL | Status: DC | PRN

## 2021-02-09 MED ORDER — LIDOCAINE 2% (20 MG/ML) 5 ML SYRINGE
INTRAMUSCULAR | Status: AC
  Filled 2021-02-09: qty 5

## 2021-02-09 MED ORDER — CEFAZOLIN SODIUM-DEXTROSE 2-4 GM/100ML-% IV SOLN
2.0000 g | INTRAVENOUS | Status: AC
  Administered 2021-02-09: 2 g via INTRAVENOUS
  Filled 2021-02-09: qty 100

## 2021-02-09 MED ORDER — EPHEDRINE 5 MG/ML INJ
INTRAVENOUS | Status: AC
  Filled 2021-02-09: qty 10

## 2021-02-09 MED ORDER — INSULIN ASPART 100 UNIT/ML ~~LOC~~ SOLN
0.0000 [IU] | Freq: Three times a day (TID) | SUBCUTANEOUS | Status: DC
  Administered 2021-02-09: 7 [IU] via SUBCUTANEOUS

## 2021-02-09 MED ORDER — ONDANSETRON HCL 4 MG/2ML IJ SOLN: 4.0000 mg | Freq: Four times a day (QID) | INTRAMUSCULAR | Status: DC | PRN

## 2021-02-09 MED ORDER — LACTATED RINGERS IV SOLN: INTRAVENOUS | Status: DC

## 2021-02-09 MED ORDER — POLYETHYLENE GLYCOL 3350 17 G PO PACK: 17.0000 g | PACK | Freq: Every day | ORAL | Status: DC | PRN

## 2021-02-09 MED ORDER — LISINOPRIL 20 MG PO TABS
20.0000 mg | ORAL_TABLET | Freq: Every day | ORAL | Status: DC
  Filled 2021-02-09 (×2): qty 1

## 2021-02-09 MED ORDER — ROCURONIUM BROMIDE 10 MG/ML (PF) SYRINGE
PREFILLED_SYRINGE | INTRAVENOUS | Status: AC
  Filled 2021-02-09: qty 10

## 2021-02-09 MED ORDER — INSULIN ASPART 100 UNIT/ML ~~LOC~~ SOLN
0.0000 [IU] | Freq: Every day | SUBCUTANEOUS | Status: DC
Start: 2021-02-09 — End: 2021-02-12
  Administered 2021-02-09: 3 [IU] via SUBCUTANEOUS

## 2021-02-09 MED ORDER — CHLORHEXIDINE GLUCONATE 0.12 % MT SOLN
15.0000 mL | Freq: Once | OROMUCOSAL | Status: AC
  Administered 2021-02-09: 15 mL via OROMUCOSAL
  Filled 2021-02-09: qty 15

## 2021-02-09 MED ORDER — GABAPENTIN 300 MG PO CAPS
300.0000 mg | ORAL_CAPSULE | Freq: Three times a day (TID) | ORAL | Status: DC
  Administered 2021-02-09 – 2021-02-12 (×8): 300 mg via ORAL
  Filled 2021-02-09 (×8): qty 1

## 2021-02-09 MED ORDER — HYDROMORPHONE HCL 1 MG/ML IJ SOLN
0.5000 mg | INTRAMUSCULAR | Status: DC | PRN
  Administered 2021-02-09: 0.5 mg via INTRAVENOUS
  Administered 2021-02-09: 0.25 mg via INTRAVENOUS

## 2021-02-09 MED ORDER — DULOXETINE HCL 60 MG PO CPEP
60.0000 mg | ORAL_CAPSULE | Freq: Every morning | ORAL | Status: DC
  Administered 2021-02-10 – 2021-02-12 (×3): 60 mg via ORAL
  Filled 2021-02-09: qty 2
  Filled 2021-02-09 (×2): qty 1

## 2021-02-09 MED ORDER — OXYCODONE HCL 5 MG PO TABS
10.0000 mg | ORAL_TABLET | ORAL | Status: DC | PRN
  Administered 2021-02-09 – 2021-02-10 (×5): 10 mg via ORAL
  Filled 2021-02-09 (×5): qty 2

## 2021-02-09 MED ORDER — HYDROMORPHONE HCL 1 MG/ML IJ SOLN
1.0000 mg | INTRAMUSCULAR | Status: DC | PRN
  Administered 2021-02-09: 1 mg via INTRAVENOUS
  Filled 2021-02-09: qty 1

## 2021-02-09 MED ORDER — ACETAMINOPHEN 650 MG RE SUPP: 650.0000 mg | RECTAL | Status: DC | PRN

## 2021-02-09 MED ORDER — METFORMIN HCL ER 500 MG PO TB24
1000.0000 mg | ORAL_TABLET | Freq: Two times a day (BID) | ORAL | Status: DC
  Administered 2021-02-09 – 2021-02-12 (×6): 1000 mg via ORAL
  Filled 2021-02-09 (×6): qty 2

## 2021-02-09 MED ORDER — VANCOMYCIN HCL 1000 MG IV SOLR
INTRAVENOUS | Status: DC | PRN
  Administered 2021-02-09: 1000 mg

## 2021-02-09 MED ORDER — ALBUMIN HUMAN 5 % IV SOLN: INTRAVENOUS | Status: DC | PRN

## 2021-02-09 MED ORDER — PHENYLEPHRINE 40 MCG/ML (10ML) SYRINGE FOR IV PUSH (FOR BLOOD PRESSURE SUPPORT)
PREFILLED_SYRINGE | INTRAVENOUS | Status: DC | PRN
  Administered 2021-02-09 (×2): 80 ug via INTRAVENOUS
  Administered 2021-02-09: 160 ug via INTRAVENOUS
  Administered 2021-02-09: 120 ug via INTRAVENOUS

## 2021-02-09 MED ORDER — VASOPRESSIN 20 UNIT/ML IV SOLN
INTRAVENOUS | Status: DC | PRN
  Administered 2021-02-09: 2 [IU] via INTRAVENOUS
  Administered 2021-02-09 (×4): 1 [IU] via INTRAVENOUS

## 2021-02-09 MED ORDER — MENTHOL 3 MG MT LOZG: 1.0000 | LOZENGE | OROMUCOSAL | Status: DC | PRN

## 2021-02-09 MED ORDER — ONDANSETRON HCL 4 MG/2ML IJ SOLN
INTRAMUSCULAR | Status: DC | PRN
  Administered 2021-02-09: 4 mg via INTRAVENOUS

## 2021-02-09 MED ORDER — ATORVASTATIN CALCIUM 40 MG PO TABS
40.0000 mg | ORAL_TABLET | Freq: Every day | ORAL | Status: DC
  Administered 2021-02-09 – 2021-02-11 (×3): 40 mg via ORAL
  Filled 2021-02-09 (×3): qty 1

## 2021-02-09 MED ORDER — LEVOTHYROXINE SODIUM 75 MCG PO TABS
75.0000 ug | ORAL_TABLET | Freq: Every day | ORAL | Status: DC
  Administered 2021-02-10 – 2021-02-12 (×3): 75 ug via ORAL
  Filled 2021-02-09 (×3): qty 1

## 2021-02-09 MED ORDER — ONDANSETRON HCL 4 MG/2ML IJ SOLN
INTRAMUSCULAR | Status: AC
  Filled 2021-02-09: qty 2

## 2021-02-09 MED ORDER — PROPOFOL 10 MG/ML IV BOLUS
INTRAVENOUS | Status: AC
  Filled 2021-02-09: qty 20

## 2021-02-09 MED ORDER — BUSPIRONE HCL 10 MG PO TABS
30.0000 mg | ORAL_TABLET | Freq: Two times a day (BID) | ORAL | Status: DC
  Administered 2021-02-09 – 2021-02-12 (×7): 30 mg via ORAL
  Filled 2021-02-09 (×2): qty 3
  Filled 2021-02-09 (×2): qty 2
  Filled 2021-02-09: qty 6
  Filled 2021-02-09: qty 3
  Filled 2021-02-09 (×2): qty 2

## 2021-02-09 MED ORDER — DEXAMETHASONE SODIUM PHOSPHATE 10 MG/ML IJ SOLN
10.0000 mg | Freq: Once | INTRAMUSCULAR | Status: AC
  Administered 2021-02-09: 5 mg via INTRAVENOUS
  Filled 2021-02-09: qty 1

## 2021-02-09 MED ORDER — CEFAZOLIN SODIUM-DEXTROSE 1-4 GM/50ML-% IV SOLN
1.0000 g | Freq: Two times a day (BID) | INTRAVENOUS | Status: AC
  Administered 2021-02-09: 1 g via INTRAVENOUS
  Filled 2021-02-09: qty 50

## 2021-02-09 MED ORDER — QUETIAPINE FUMARATE 100 MG PO TABS
300.0000 mg | ORAL_TABLET | Freq: Every day | ORAL | Status: DC
  Administered 2021-02-09 – 2021-02-11 (×3): 300 mg via ORAL
  Filled 2021-02-09 (×2): qty 1
  Filled 2021-02-09 (×2): qty 3

## 2021-02-09 MED ORDER — ROCURONIUM BROMIDE 10 MG/ML (PF) SYRINGE
PREFILLED_SYRINGE | INTRAVENOUS | Status: DC | PRN
  Administered 2021-02-09: 100 mg via INTRAVENOUS

## 2021-02-09 MED ORDER — LIDOCAINE 2% (20 MG/ML) 5 ML SYRINGE
INTRAMUSCULAR | Status: DC | PRN
  Administered 2021-02-09: 60 mg via INTRAVENOUS

## 2021-02-09 MED ORDER — ASPIRIN EC 81 MG PO TBEC
81.0000 mg | DELAYED_RELEASE_TABLET | Freq: Every day | ORAL | Status: DC
  Administered 2021-02-09 – 2021-02-12 (×4): 81 mg via ORAL
  Filled 2021-02-09 (×4): qty 1

## 2021-02-09 MED ORDER — FLEET ENEMA 7-19 GM/118ML RE ENEM: 1.0000 | ENEMA | Freq: Once | RECTAL | Status: DC | PRN

## 2021-02-09 MED ORDER — ORAL CARE MOUTH RINSE: 15.0000 mL | Freq: Once | OROMUCOSAL | Status: AC

## 2021-02-09 MED ORDER — PHENOL 1.4 % MT LIQD: 1.0000 | OROMUCOSAL | Status: DC | PRN

## 2021-02-09 MED ORDER — HYDROCODONE-ACETAMINOPHEN 10-325 MG PO TABS
1.0000 | ORAL_TABLET | ORAL | Status: DC | PRN
  Administered 2021-02-09 – 2021-02-11 (×5): 1 via ORAL
  Filled 2021-02-09 (×5): qty 1

## 2021-02-09 MED ORDER — EPHEDRINE SULFATE-NACL 50-0.9 MG/10ML-% IV SOSY
PREFILLED_SYRINGE | INTRAVENOUS | Status: DC | PRN
  Administered 2021-02-09 (×5): 10 mg via INTRAVENOUS

## 2021-02-09 MED ORDER — CEFAZOLIN SODIUM-DEXTROSE 1-4 GM/50ML-% IV SOLN: 1.0000 g | Freq: Two times a day (BID) | INTRAVENOUS | Status: DC

## 2021-02-09 MED ORDER — SODIUM CHLORIDE 0.9 % IV SOLN
250.0000 mL | INTRAVENOUS | Status: DC
  Administered 2021-02-09: 250 mL via INTRAVENOUS

## 2021-02-09 MED ORDER — FENTANYL CITRATE (PF) 250 MCG/5ML IJ SOLN
INTRAMUSCULAR | Status: DC | PRN
  Administered 2021-02-09: 100 ug via INTRAVENOUS
  Administered 2021-02-09: 25 ug via INTRAVENOUS

## 2021-02-09 MED ORDER — PROPOFOL 10 MG/ML IV BOLUS
INTRAVENOUS | Status: DC | PRN
  Administered 2021-02-09: 50 mg via INTRAVENOUS
  Administered 2021-02-09: 150 mg via INTRAVENOUS

## 2021-02-09 MED ORDER — SODIUM CHLORIDE 0.9% FLUSH
3.0000 mL | Freq: Two times a day (BID) | INTRAVENOUS | Status: DC
  Administered 2021-02-09 – 2021-02-11 (×4): 3 mL via INTRAVENOUS

## 2021-02-09 MED ORDER — DEXAMETHASONE SODIUM PHOSPHATE 10 MG/ML IJ SOLN
INTRAMUSCULAR | Status: AC
  Filled 2021-02-09: qty 1

## 2021-02-09 MED ORDER — HYDROMORPHONE HCL 1 MG/ML IJ SOLN
INTRAMUSCULAR | Status: AC
  Filled 2021-02-09: qty 1

## 2021-02-09 MED ORDER — FENTANYL CITRATE (PF) 250 MCG/5ML IJ SOLN
INTRAMUSCULAR | Status: AC
  Filled 2021-02-09: qty 5

## 2021-02-09 MED ORDER — THROMBIN 20000 UNITS EX SOLR: CUTANEOUS | Status: DC | PRN

## 2021-02-09 MED ORDER — VASOPRESSIN 20 UNIT/ML IV SOLN
INTRAVENOUS | Status: AC
  Filled 2021-02-09: qty 1

## 2021-02-09 MED ORDER — INSULIN ASPART 100 UNIT/ML ~~LOC~~ SOLN
0.0000 [IU] | Freq: Three times a day (TID) | SUBCUTANEOUS | Status: DC
  Administered 2021-02-10: 7 [IU] via SUBCUTANEOUS
  Administered 2021-02-10 (×2): 4 [IU] via SUBCUTANEOUS
  Administered 2021-02-11: 3 [IU] via SUBCUTANEOUS
  Administered 2021-02-11: 4 [IU] via SUBCUTANEOUS
  Administered 2021-02-11: 3 [IU] via SUBCUTANEOUS
  Administered 2021-02-12 (×2): 4 [IU] via SUBCUTANEOUS

## 2021-02-09 MED ORDER — ACETAMINOPHEN 325 MG PO TABS
650.0000 mg | ORAL_TABLET | ORAL | Status: DC | PRN
  Administered 2021-02-09 (×2): 650 mg via ORAL
  Filled 2021-02-09 (×2): qty 2

## 2021-02-09 MED ORDER — BISACODYL 10 MG RE SUPP: 10.0000 mg | Freq: Every day | RECTAL | Status: DC | PRN

## 2021-02-09 MED ORDER — ACETAMINOPHEN 500 MG PO TABS
1000.0000 mg | ORAL_TABLET | Freq: Once | ORAL | Status: AC
  Administered 2021-02-09: 1000 mg via ORAL
  Filled 2021-02-09: qty 2

## 2021-02-09 MED ORDER — METOPROLOL SUCCINATE ER 50 MG PO TB24
50.0000 mg | ORAL_TABLET | Freq: Two times a day (BID) | ORAL | Status: DC
  Administered 2021-02-09 – 2021-02-11 (×4): 50 mg via ORAL
  Filled 2021-02-09 (×6): qty 1

## 2021-02-09 MED ORDER — PHENYLEPHRINE 40 MCG/ML (10ML) SYRINGE FOR IV PUSH (FOR BLOOD PRESSURE SUPPORT)
PREFILLED_SYRINGE | INTRAVENOUS | Status: AC
  Filled 2021-02-09: qty 10

## 2021-02-09 SURGICAL SUPPLY — 68 items
ADH SKN CLS APL DERMABOND .7 (GAUZE/BANDAGES/DRESSINGS) ×1
APL SKNCLS STERI-STRIP NONHPOA (GAUZE/BANDAGES/DRESSINGS) ×1
BAG DECANTER FOR FLEXI CONT (MISCELLANEOUS) ×1 IMPLANT
BENZOIN TINCTURE PRP APPL 2/3 (GAUZE/BANDAGES/DRESSINGS) ×2 IMPLANT
BLADE CLIPPER SURG (BLADE) IMPLANT
BONE GRAFTON DBF INJECT 3CC (Bone Implant) ×2 IMPLANT
BUR CUTTER 7.0 ROUND (BURR) IMPLANT
BUR MATCHSTICK NEURO 3.0 LAGG (BURR) ×2 IMPLANT
CANISTER SUCT 3000ML PPV (MISCELLANEOUS) ×2 IMPLANT
CAP LCK SPNE (Orthopedic Implant) ×6 IMPLANT
CAP LOCK SPINE RADIUS (Orthopedic Implant) IMPLANT
CAP LOCKING (Orthopedic Implant) ×12 IMPLANT
CARTRIDGE OIL MAESTRO DRILL (MISCELLANEOUS) ×1 IMPLANT
CLSR STERI-STRIP ANTIMIC 1/2X4 (GAUZE/BANDAGES/DRESSINGS) ×1 IMPLANT
CNTNR URN SCR LID CUP LEK RST (MISCELLANEOUS) ×1 IMPLANT
CONT SPEC 4OZ STRL OR WHT (MISCELLANEOUS) ×2
COVER BACK TABLE 60X90IN (DRAPES) ×2 IMPLANT
COVER WAND RF STERILE (DRAPES) ×1 IMPLANT
DECANTER SPIKE VIAL GLASS SM (MISCELLANEOUS) ×2 IMPLANT
DERMABOND ADVANCED (GAUZE/BANDAGES/DRESSINGS) ×1
DERMABOND ADVANCED .7 DNX12 (GAUZE/BANDAGES/DRESSINGS) ×1 IMPLANT
DIFFUSER DRILL AIR PNEUMATIC (MISCELLANEOUS) ×2 IMPLANT
DRAPE C-ARM 42X72 X-RAY (DRAPES) ×4 IMPLANT
DRAPE HALF SHEET 40X57 (DRAPES) IMPLANT
DRAPE LAPAROTOMY 100X72X124 (DRAPES) ×2 IMPLANT
DRAPE SURG 17X23 STRL (DRAPES) ×8 IMPLANT
DRSG OPSITE POSTOP 4X6 (GAUZE/BANDAGES/DRESSINGS) ×2 IMPLANT
DRSG OPSITE POSTOP 4X8 (GAUZE/BANDAGES/DRESSINGS) ×1 IMPLANT
DURAPREP 26ML APPLICATOR (WOUND CARE) ×2 IMPLANT
ELECT REM PT RETURN 9FT ADLT (ELECTROSURGICAL) ×2
ELECTRODE REM PT RTRN 9FT ADLT (ELECTROSURGICAL) ×1 IMPLANT
EVACUATOR 1/8 PVC DRAIN (DRAIN) IMPLANT
GAUZE 4X4 16PLY RFD (DISPOSABLE) IMPLANT
GAUZE SPONGE 4X4 12PLY STRL (GAUZE/BANDAGES/DRESSINGS) IMPLANT
GLOVE BIO SURGEON STRL SZ 6.5 (GLOVE) ×2 IMPLANT
GLOVE ECLIPSE 9.0 STRL (GLOVE) ×4 IMPLANT
GLOVE EXAM NITRILE XL STR (GLOVE) IMPLANT
GLOVE SURG PR MICRO ENCORE 7 (GLOVE) ×5 IMPLANT
GLOVE SURG UNDER POLY LF SZ6.5 (GLOVE) ×2 IMPLANT
GLOVE SURG UNDER POLY LF SZ7.5 (GLOVE) ×2 IMPLANT
GOWN STRL REUS W/ TWL LRG LVL3 (GOWN DISPOSABLE) IMPLANT
GOWN STRL REUS W/ TWL XL LVL3 (GOWN DISPOSABLE) ×2 IMPLANT
GOWN STRL REUS W/TWL 2XL LVL3 (GOWN DISPOSABLE) IMPLANT
GOWN STRL REUS W/TWL LRG LVL3 (GOWN DISPOSABLE) ×2
GOWN STRL REUS W/TWL XL LVL3 (GOWN DISPOSABLE) ×6
KIT BASIN OR (CUSTOM PROCEDURE TRAY) ×2 IMPLANT
KIT TURNOVER KIT B (KITS) ×2 IMPLANT
MILL MEDIUM DISP (BLADE) ×2 IMPLANT
NEEDLE HYPO 22GX1.5 SAFETY (NEEDLE) ×2 IMPLANT
NS IRRIG 1000ML POUR BTL (IV SOLUTION) ×2 IMPLANT
OIL CARTRIDGE MAESTRO DRILL (MISCELLANEOUS) ×2
PACK LAMINECTOMY NEURO (CUSTOM PROCEDURE TRAY) ×2 IMPLANT
ROD 80MM (Rod) ×4 IMPLANT
ROD SPNL 80X5.5 NS TI RDS (Rod) IMPLANT
SCREW 5.75X40M (Screw) ×2 IMPLANT
SPACER PL CATALYFT LONG 11 (Spacer) ×2 IMPLANT
SPONGE SURGIFOAM ABS GEL 100 (HEMOSTASIS) ×2 IMPLANT
STRIP CLOSURE SKIN 1/2X4 (GAUZE/BANDAGES/DRESSINGS) ×4 IMPLANT
SUT VIC AB 0 CT1 18XCR BRD8 (SUTURE) ×2 IMPLANT
SUT VIC AB 0 CT1 8-18 (SUTURE) ×2
SUT VIC AB 1 CT1 18XBRD ANBCTR (SUTURE) IMPLANT
SUT VIC AB 1 CT1 8-18 (SUTURE) ×2
SUT VIC AB 2-0 CT1 18 (SUTURE) ×3 IMPLANT
SUT VIC AB 3-0 SH 8-18 (SUTURE) ×4 IMPLANT
TOWEL GREEN STERILE (TOWEL DISPOSABLE) ×2 IMPLANT
TOWEL GREEN STERILE FF (TOWEL DISPOSABLE) ×2 IMPLANT
TRAY FOLEY MTR SLVR 16FR STAT (SET/KITS/TRAYS/PACK) ×2 IMPLANT
WATER STERILE IRR 1000ML POUR (IV SOLUTION) ×2 IMPLANT

## 2021-02-09 NOTE — Anesthesia Procedure Notes (Signed)
Procedure Name: Intubation Date/Time: 02/09/2021 8:19 AM Performed by: Trinna Post., CRNA Pre-anesthesia Checklist: Patient identified, Emergency Drugs available, Suction available, Patient being monitored and Timeout performed Patient Re-evaluated:Patient Re-evaluated prior to induction Oxygen Delivery Method: Circle system utilized Preoxygenation: Pre-oxygenation with 100% oxygen Induction Type: IV induction Ventilation: Two handed mask ventilation required Laryngoscope Size: Glidescope and 3 Grade View: Grade I Tube type: Oral Tube size: 7.0 mm Number of attempts: 1 Airway Equipment and Method: Rigid stylet and Video-laryngoscopy Placement Confirmation: ETT inserted through vocal cords under direct vision,  positive ETCO2 and breath sounds checked- equal and bilateral Secured at: 22 cm Tube secured with: Tape Dental Injury: Teeth and Oropharynx as per pre-operative assessment

## 2021-02-09 NOTE — Progress Notes (Signed)
Orthopedic Tech Progress Note Patient Details:  [name] [birth year] [id number]  Ortho Devices Type of Ortho Device: Lumbar corsett Ortho Device/Splint Location: back Ortho Device/Splint Interventions: Ordered   Post Interventions Patient Tolerated: Other (comment) Instructions Provided: Other (comment)   Ellouise Newer 02/09/2021, 12:03 PM

## 2021-02-09 NOTE — H&P (Signed)
[name] is an 75 y.o. female.   Chief Complaint: Back pain HPI: 75 year old female status post prior L4-5 decompression and fusion presents with intractable lower back pain with radiation into the posterior aspects of both lower extremities.  Symptoms are aggravated by attempts at standing or walking.  They have failed conservative management including injections.  Work-up demonstrates evidence of stable and solid appearance for fusion at L4-5.  At L5-S1 she has evidence of severe facet arthropathy with facet joint diastases and significant lateral recess and foraminal stenosis.  Patient presents now for L5-S1 decompression and fusion in hopes of improving her symptoms.  Past Medical History:  Diagnosis Date  . Amnesia   . Anxiety   . Arthritis   . Back pain   . CHF (congestive heart failure) ([place])   . Chronic kidney disease    Acute on chronic 10/2020  . Chronic pain syndrome   . Cystocele   . Depression   . Diabetes mellitus without complication ([place])    Type II  . GERD (gastroesophageal reflux disease)   . History of blood transfusion    with knee placements  . Hyperlipidemia   . Hypertension   . IBS (irritable bowel syndrome)   . Obesity   . Osteoporosis   . Osteoporosis   . Panic attacks   . Seizure ([place])   . Shortness of breath dyspnea   . Sleep apnea    not using now  . Thyroid nodule     Past Surgical History:  Procedure Laterality Date  . ABDOMINAL HYSTERECTOMY    . APPENDECTOMY    . CARDIAC CATHETERIZATION    . CHOLECYSTECTOMY    . CORONARY STENT INTERVENTION N/A 05/05/2018   Procedure: CORONARY STENT INTERVENTION;  Surgeon: Isaias Cowman, MD;  Location: [facility];  Service: Cardiovascular;  Laterality: N/A;  . FOOT SURGERY    . LEFT HEART CATH AND CORONARY ANGIOGRAPHY Left 05/05/2018   Procedure: LEFT HEART CATH AND CORONARY ANGIOGRAPHY;  Surgeon: Isaias Cowman, MD;  Location: [facility];  Service: Cardiovascular;   Laterality: Left;  . OOPHORECTOMY    . REPLACEMENT TOTAL KNEE    . TONSILLECTOMY      Family History  Problem Relation Age of Onset  . Cancer Mother   . Cancer Father   . Cancer Sister   . Thyroid disease Neg Hx    Social History:  reports that she has never smoked. She has never used smokeless tobacco. She reports that she does not drink alcohol and does not use drugs.  Allergies: No Known Allergies  Medications Prior to Admission  Medication Sig Dispense Refill  . aspirin EC 81 MG tablet Take 81 mg by mouth daily.    Marland Kitchen atorvastatin (LIPITOR) 40 MG tablet Take 40 mg by mouth at bedtime.    . busPIRone (BUSPAR) 30 MG tablet Take 30 mg by mouth 2 (two) times daily.    . DULoxetine (CYMBALTA) 60 MG capsule Take 60 mg by mouth in the morning.    . gabapentin (NEURONTIN) 300 MG capsule Take 1 capsule (300 mg total) by mouth 3 (three) times daily. 90 capsule 0  . insulin glargine (LANTUS) 100 UNIT/ML injection Inject 0.3 mLs (30 Units total) into the skin at bedtime. (Patient taking differently: Inject 72 Units into the skin at bedtime.) 10 mL 11  . levothyroxine (SYNTHROID) 75 MCG tablet Take 1 tablet (75 mcg total) by mouth daily before breakfast. 30 tablet 1  . lisinopril (  ZESTRIL) 20 MG tablet Take 20 mg by mouth daily.    . metFORMIN (GLUCOPHAGE-XR) 500 MG 24 hr tablet Take 1,000 mg by mouth 2 (two) times daily.    . metoprolol succinate (TOPROL-XL) 50 MG 24 hr tablet Take 50 mg by mouth in the morning and at bedtime. Take with or immediately following a meal.    . oxyCODONE-acetaminophen (PERCOCET) 10-325 MG tablet Take 1 tablet by mouth in the morning, at noon, in the evening, and at bedtime.    Marland Kitchen QUEtiapine (SEROQUEL) 300 MG tablet Take 300 mg by mouth at bedtime.    . metoprolol succinate (TOPROL-XL) 25 MG 24 hr tablet Take 1 tablet (25 mg total) by mouth 2 (two) times daily. (Patient not taking: No sig reported) 60 tablet 0  . sodium chloride (OCEAN) 0.65 % SOLN nasal spray Place  1 spray into both nostrils as needed for congestion. (Patient not taking: Reported on 01/30/2021) 30 mL 0  . topiramate (TOPAMAX) 50 MG tablet Take 1 tablet (50 mg total) by mouth daily. (Patient not taking: Reported on 01/30/2021) 30 tablet 0    Results for orders placed or performed during the hospital encounter of 02/09/21 (from the past 48 hour(s))  Glucose, capillary     Status: Abnormal   Collection Time: 02/09/21  6:34 AM  Result Value Ref Range   Glucose-Capillary 123 (H) 70 - 99 mg/dL    Comment: Glucose reference range applies only to samples taken after fasting for at least 8 hours.  Type and screen [facility]     Status: None (Preliminary result)   Collection Time: 02/09/21  7:00 AM  Result Value Ref Range   ABO/RH(D) PENDING    Antibody Screen PENDING    Sample Expiration      02/12/2021,2359 Performed at [facility], [place] [address] [place], [place] [address]    No results found.  Pertinent items noted in HPI and remainder of comprehensive ROS otherwise negative.  Blood pressure 132/60, pulse 80, temperature 98.3 F (36.8 C), temperature source Oral, resp. rate 18, height 5\' 3"  (1.6 m), weight 111.6 kg, SpO2 93 %.  Patient is awake and alert.  She is oriented and appropriate.  She is obviously uncomfortable examination of her speech finds it to be fluent.  Cranial nerve function normal bilateral.  Motor examination 5/5 bilateral upper and lower extremities.  Sensory examination with some decrease sensation pinprick and light touch distally in both lower extremities.  Reflexes are hypoactive but symmetric.  No evidence of long track signs.  Gait is antalgic.  Posture is flexed.  Examination head ears eyes nose throat is unremarkable chest and abdomen are obese but otherwise benign.  Extremities are free of major deformity. 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  Plan bilateral L5-S1  decompressive laminotomies and foraminotomies followed by posterior lumbar interbody fusion utilizing interbody cages, local harvested autograft, and augmented with posterior arthrodesis utilizing nonsegmental pedicle screw fixation.  Risks and benefits been explained.  Patient wishes to proceed.  [name] A Aarianna Hoadley 02/09/2021, 7:47 AM

## 2021-02-09 NOTE — Anesthesia Preprocedure Evaluation (Addendum)
Anesthesia Evaluation  Patient identified by MRN, date of birth, ID band Patient awake    Reviewed: Allergy & Precautions, NPO status , Patient's Chart, lab work & pertinent test results, reviewed documented beta blocker date and time   Airway Mallampati: III  TM Distance: >3 FB Neck ROM: Full    Dental no notable dental hx. (+) Teeth Intact, Dental Advisory Given   Pulmonary shortness of breath and with exertion, sleep apnea (does not use CPAP) ,    Pulmonary exam normal breath sounds clear to auscultation       Cardiovascular hypertension, Pt. on medications and Pt. on home beta blockers + CAD and + Cardiac Stents (DES to LAD 2019)  Normal cardiovascular exam Rhythm:Regular Rate:Normal  Cath 2019:  Ost LAD lesion is 30% stenosed.  Prox LAD-1 lesion is 20% stenosed.  Prox LAD-2 lesion is 90% stenosed.  Ost Cx to Prox Cx lesion is 30% stenosed.  Prox RCA lesion is 20% stenosed.  Dist RCA lesion is 30% stenosed.  A drug-eluting stent was successfully placed using a STENT SIERRA 3.50 X 18 MM.  Post intervention, there is a 0% residual stenosis.   1.  One-vessel coronary artery disease with 90% stenosis proximal LAD 2.  Normal left ventricular function 3.  Successful PCI with DES proximal LAD     Neuro/Psych Seizures - (none in 5 years), Well Controlled,  PSYCHIATRIC DISORDERS Anxiety Depression    GI/Hepatic Neg liver ROS, GERD  Controlled,  Endo/Other  diabetes, Poorly Controlled, Type 2, Oral Hypoglycemic Agents, Insulin DependentHypothyroidism Morbid obesityBMI 43 a1c 8.4 FS 123 Insulin 1/2 dose longacting at night   Renal/GU negative Renal ROS  negative genitourinary   Musculoskeletal  (+) Arthritis , Osteoarthritis,  Lumbar spondylolisthesis  Chronic pain- percocet 10/325    Abdominal   Peds  Hematology negative hematology ROS (+)   Anesthesia Other Findings   Reproductive/Obstetrics negative  OB ROS                           Anesthesia Physical Anesthesia Plan  ASA: III  Anesthesia Plan: General   Post-op Pain Management:    Induction: Intravenous  PONV Risk Score and Plan: 3 and Ondansetron, Dexamethasone and Treatment may vary due to age or medical condition  Airway Management Planned: Oral ETT and Video Laryngoscope Planned  Additional Equipment: None  Intra-op Plan:   Post-operative Plan: Extubation in OR  Informed Consent: I have reviewed the patients History and Physical, chart, labs and discussed the procedure including the risks, benefits and alternatives for the proposed anesthesia with the patient or authorized representative who has indicated his/her understanding and acceptance.     Dental advisory given  Plan Discussed with: CRNA  Anesthesia Plan Comments:       Anesthesia Quick Evaluation

## 2021-02-09 NOTE — Anesthesia Postprocedure Evaluation (Signed)
Anesthesia Post Note  Patient: SURY WENTWORTH  Procedure(s) Performed: Posterior Lumbar Interbody Fusion - Lumbar five-Sacral one (N/A Spine Lumbar)     Patient location during evaluation: PACU Anesthesia Type: General Level of consciousness: awake and alert, oriented and patient cooperative Pain management: pain level controlled Vital Signs Assessment: post-procedure vital signs reviewed and stable Respiratory status: spontaneous breathing, nonlabored ventilation and respiratory function stable Cardiovascular status: blood pressure returned to baseline and stable Postop Assessment: no apparent nausea or vomiting Anesthetic complications: no   No complications documented.  Last Vitals:  Vitals:   02/09/21 1147 02/09/21 1202  BP: (!) 114/53 127/63  Pulse: 89 90  Resp: 12 18  Temp:    SpO2: 100% 96%    Last Pain:  Vitals:   02/09/21 1147  TempSrc:   PainSc: [place]

## 2021-02-09 NOTE — Op Note (Signed)
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  L5-S1 posterior lumbar interbody fusion utilizing interbody expandable titanium cages, morselized autograft and allograft.  L5-S1 posterior lateral arthrodesis utilizing segmental pedicle screw fixation from L4-S1 and local autografting.  Surgeon:Corneluis Allston A.Kimiko Common, M.D.  Asst. Surgeon: Marcello Moores, MD    Reinaldo Meeker, NP  Anesthesia: General  Indication: 75 year old female status post remote L4-5 decompression and fusion.  Patient with progressive lower back pain and lower extremity symptoms failing conservative management her work-up demonstrates evidence of marked adjacent level degeneration L5-S1 with severe facet arthropathy with associated facet diastases.  Patient also with significant lateral recess and foraminal stenosis.  Patient presents now for decompression and fusion in hopes improving her symptoms.  Operative note: After induction anesthesia, patient edition prone on the Wilson frame and properly padded.  Lumbar region prepped and draped sterilely.  Incision made from L4-S1.  Dissection performed bilaterally.  Retractor placed.  Fluoroscopy used.  Levels confirmed.  Decompressive laminotomies were then performed bilaterally using [place] rongeurs care centers and high-speed drill to remove the entire spinous process of L5, the inferior three quarters of the lamina of L5 bilaterally the entire inferior facet and pars interarticularis of L5 bilaterally, the majority of the superior facet of S1, and the superior  aspect of the lamina of S1.  Ligament flavum elevated and resected.  Bilateral discectomies then performed.  Dissipates then prepared for interbody fusion.  With a distractor placed patient's right side soft tissue was removed the interspace and I 11 mm [employer] expandable cage was then impacted in the place and expanded.  Distractor removed patient's right side.  To space prepared on the right side.  Morselized autograft packed in the interspace.  A second cage impacted in place and expanded.  Each cage was then filled with morselized demineralized bone fibers.  Pedicles of S1 were then applied using surface landmarks and intraoperative fluoroscopy and superficial bone around the pedicle was then removed and high-speed drill each pedicle was then probed using a pedicle awl each pedicle tract was then probed and found to be solidly within the bone.  Each pedicle tract was then tapped with a screw tap.  Screw hole was then probed and found to be solidly within the bone.  5.75 mm x 40 mm radius brand screws from [employer] were placed bilaterally at S1.  Previously placed pedicle screws rotation at L4 and L5 was dissected free, disassembled and the rods were removed.  Screws were found to be solid in place.  Fusion was directly inspected for 5 and it appeared quite solid which was consistent with the preoperative radiographic findings.  Final images reveal good position of the cages and hardware in proper upper level with normal alignment of spine.  Short segment titanium rods and placed over the screw heads at L4-L5 and S1.  Locking caps placed over the screws.  Locking caps then engaged.  Transverse processes and sacral ala were decorticated L5-S1.  Morselized autograft was packed posterior laterally.  Gelfoam was placed over the laminectomy defect.  Vancomycin powder placed in the deep wound space.  Wounds then closed in layers with Vicryl sutures.  Steri-Strips and  sterile dressing were applied.  No apparent  complications.  Patient tolerated the procedure well and she returned to the recovery room postop.

## 2021-02-09 NOTE — Transfer of Care (Signed)
Immediate Anesthesia Transfer of Care Note  Patient: [name]  Procedure(s) Performed: Posterior Lumbar Interbody Fusion - Lumbar five-Sacral one (N/A Spine Lumbar)  Patient Location: PACU  Anesthesia Type:General  Level of Consciousness: awake and drowsy  Airway & Oxygen Therapy: Patient Spontanous Breathing and Patient connected to face mask oxygen  Post-op Assessment: Report given to RN and Post -op Vital signs reviewed and stable  Post vital signs: Reviewed and stable  Last Vitals:  Vitals Value Taken Time  BP 127/53 02/09/21 1117  Temp    Pulse 91 02/09/21 1123  Resp 22 02/09/21 1123  SpO2 92 % 02/09/21 1123  Vitals shown include unvalidated device data.  Last Pain:  Vitals:   02/09/21 0721  TempSrc:   PainSc: 9       Patients Stated Pain Goal: 5 (86/57/84 6962)  Complications: No complications documented.

## 2021-02-09 NOTE — Progress Notes (Signed)
PHARMACY NOTE:  ANTIMICROBIAL RENAL DOSAGE ADJUSTMENT  Current antimicrobial regimen includes a mismatch between antimicrobial dosage and estimated renal function.  As per policy approved by the Pharmacy & Therapeutics and Medical Executive Committees, the antimicrobial dosage will be adjusted accordingly.  Current antimicrobial dosage:  Cefazolin 1g q8 hr  Indication: surgical prophylaxis   Renal Function:  Estimated Creatinine Clearance: 46.7 mL/min (A) (by C-G formula based on SCr of 1.29 mg/dL (H)).      Antimicrobial dosage has been changed to:  Cefazolin 1g q12 hr      Thank you for allowing pharmacy to be a part of this patient's care.   Benetta Spar, PharmD, BCPS, BCCP Clinical Pharmacist  Please check AMION for all [place] phone numbers After 10:00 PM, call [place] [phone]

## 2021-02-09 NOTE — Evaluation (Signed)
Physical Therapy Evaluation Patient Details Name: [name] MRN: [id number] DOB: December 27, 1945 Today's Date: 02/09/2021   History of Present Illness  75 y/o female s/p PLIF L5-S1 on 4/8. PMH: anxiety, CHF, CKD, chronic pain syndrome, depression, DM type II, GERD, HTN, HLD, IBS, seizure, sleep apnea  Clinical Impression  PTA, patient lives with daughter and roommate and requires assistance for sink bathing by daughter and ambulates with rollator. Patient requires minA for bed mobility and min guard for OOB mobility with RW. Educated patient on back precautions with handout, progressive walking program, and log roll technique. Patient presents with generalized weakness, impaired balance, and decreased activity tolerance. Patient will benefit from skilled PT services during acute stay to address listed deficits. Recommend HHPT following discharge to maximize functional mobility and independence.     Follow Up Recommendations Home health PT;Supervision/Assistance - 24 hour    Equipment Recommendations  Rolling Abdalrahman Clementson with 5" wheels    Recommendations for Other Services       Precautions / Restrictions Precautions Precautions: Back;Fall Precaution Booklet Issued: Yes (comment) Required Braces or Orthoses: Spinal Brace Spinal Brace: Lumbar corset;Applied in sitting position Restrictions Weight Bearing Restrictions: No      Mobility  Bed Mobility Overal bed mobility: Needs Assistance Bed Mobility: Rolling;Sidelying to Sit;Sit to Sidelying Rolling: Min guard Sidelying to sit: Min assist     Sit to sidelying: Min assist General bed mobility comments: Instructed on log roll technique. MinA for trunk elevation and return LEs to EOB. Needs reinforcement on log roll    Transfers Overall transfer level: Needs assistance Equipment used: Rolling Wiktoria Hemrick (2 wheeled) Transfers: Sit to/from Stand Sit to Stand: Min guard         General transfer comment: min guard for  safety  Ambulation/Gait Ambulation/Gait assistance: Min guard Gait Distance (Feet): 100 Feet Assistive device: Rolling Hermena Swint (2 wheeled) Gait Pattern/deviations: Decreased stride length;Step-to pattern;Antalgic;Wide base of support Gait velocity: decreased   General Gait Details: slow, steady gait. Min guard for Scientist, research (medical)    Modified Rankin (Stroke Patients Only)       Balance Overall balance assessment: Mild deficits observed, not formally tested                                           Pertinent Vitals/Pain Pain Assessment: Faces Faces Pain Scale: Hurts even more Pain Location: back Pain Descriptors / Indicators: Grimacing;Guarding Pain Intervention(s): Monitored during session;Repositioned    Home Living Family/patient expects to be discharged to:: Private residence Living Arrangements: Children;Other (Comment) (roommate) Available Help at Discharge: Available 24 hours/day Type of Home: House Home Access: Level entry     Home Layout: One level Home Equipment: Kimble Hitchens - 4 wheels Additional Comments: Pt reports taking sink bath with daughter's assistance    Prior Function Level of Independence: Needs assistance   Gait / Transfers Assistance Needed: ambulates with rollator. Reports multiple falls in last 6 months  ADL's / Homemaking Assistance Needed: needs assistance from daughter for sink baths        Hand Dominance        Extremity/Trunk Assessment   Upper Extremity Assessment Upper Extremity Assessment: Defer to OT evaluation    Lower Extremity Assessment Lower Extremity Assessment: Overall WFL for tasks assessed    Cervical / Trunk Assessment Cervical / Trunk Assessment:  Other exceptions (s/p back surgery)  Communication   Communication: No difficulties  Cognition Arousal/Alertness: Awake/alert Behavior During Therapy: WFL for tasks assessed/performed Overall Cognitive Status:  Within Functional Limits for tasks assessed                                        General Comments      Exercises     Assessment/Plan    PT Assessment Patient needs continued PT services  PT Problem List Decreased strength;Decreased activity tolerance;Decreased balance;Decreased mobility;Decreased knowledge of precautions       PT Treatment Interventions DME instruction;Gait training;Functional mobility training;Therapeutic activities;Therapeutic exercise;Balance training;Patient/family education    PT Goals (Current goals can be found in the Care Plan section)  Acute Rehab PT Goals Patient Stated Goal: to go home PT Goal Formulation: With patient Time For Goal Achievement: 02/23/21 Potential to Achieve Goals: Good    Frequency Min 5X/week   Barriers to discharge        Co-evaluation               AM-PAC PT "6 Clicks" Mobility  Outcome Measure Help needed turning from your back to your side while in a flat bed without using bedrails?: A Little Help needed moving from lying on your back to sitting on the side of a flat bed without using bedrails?: A Little Help needed moving to and from a bed to a chair (including a wheelchair)?: A Little Help needed standing up from a chair using your arms (e.g., wheelchair or bedside chair)?: A Little Help needed to walk in hospital room?: A Little Help needed climbing 3-5 steps with a railing? : A Little 6 Click Score: 18    End of Session Equipment Utilized During Treatment: Gait belt;Back brace Activity Tolerance: Patient tolerated treatment well Patient left: in bed;with call bell/phone within reach Nurse Communication: Mobility status PT Visit Diagnosis: Unsteadiness on feet (R26.81);Muscle weakness (generalized) (M62.81)    Time: 0076-2263 PT Time Calculation (min) (ACUTE ONLY): 22 min   Charges:   PT Evaluation $PT Eval Low Complexity: 1 Low          Demari Kropp A. Gilford Rile PT, DPT Acute  Rehabilitation Services Pager [phone] Office [phone]   Linna Hoff 02/09/2021, 5:42 PM

## 2021-02-09 NOTE — Brief Op Note (Signed)
02/09/2021  11:08 AM  PATIENT:  [name]  75 y.o. female  PRE-OPERATIVE DIAGNOSIS:  Spondylolisthesis  POST-OPERATIVE DIAGNOSIS:  Spondylolisthesis  PROCEDURE:  Procedure(s): Posterior Lumbar Interbody Fusion - Lumbar five-Sacral one (N/A)  SURGEON:  Surgeon(s) and Role:    * Earnie Larsson, MD - Primary  PHYSICIAN ASSISTANT:   ASSISTANTS: Felipe Drone   ANESTHESIA:   general  EBL:  100 mL   BLOOD ADMINISTERED:none  DRAINS: none   LOCAL MEDICATIONS USED:  MARCAINE     SPECIMEN:  No Specimen  DISPOSITION OF SPECIMEN:  N/A  COUNTS:  YES  TOURNIQUET:  * No tourniquets in log *  DICTATION: .Dragon Dictation  PLAN OF CARE: Admit for overnight observation  PATIENT DISPOSITION:  PACU - hemodynamically stable.   Delay start of Pharmacological VTE agent (>24hrs) due to surgical blood loss or risk of bleeding: yes

## 2021-02-10 LAB — GLUCOSE, CAPILLARY
Glucose-Capillary: 155 mg/dL — ABNORMAL HIGH (ref 70–99)
Glucose-Capillary: 161 mg/dL — ABNORMAL HIGH (ref 70–99)
Glucose-Capillary: 216 mg/dL — ABNORMAL HIGH (ref 70–99)
Glucose-Capillary: 180 mg/dL — ABNORMAL HIGH (ref 70–99)

## 2021-02-10 MED ORDER — CYCLOBENZAPRINE HCL 5 MG PO TABS: 5.0000 mg | ORAL_TABLET | Freq: Three times a day (TID) | ORAL | Status: DC | PRN

## 2021-02-10 MED ORDER — SODIUM CHLORIDE 0.9 % IV SOLN
250.0000 mL | INTRAVENOUS | Status: DC
  Administered 2021-02-10: 250 mL via INTRAVENOUS

## 2021-02-10 MED ORDER — OXYCODONE-ACETAMINOPHEN 10-325 MG PO TABS: 1.0000 | ORAL_TABLET | Freq: Four times a day (QID) | ORAL | 0 refills | Status: DC | PRN

## 2021-02-10 MED ORDER — CYCLOBENZAPRINE HCL 10 MG PO TABS: 10.0000 mg | ORAL_TABLET | Freq: Three times a day (TID) | ORAL | 0 refills | Status: DC | PRN

## 2021-02-10 NOTE — Progress Notes (Signed)
Subjective: Patient reports low back pain that is well controlled on PO analgesics. She has no other complaints. No acute events overnight.   Objective: Vital signs in last 24 hours: Temp:  [97.5 F (36.4 C)-98.5 F (36.9 C)] 97.7 F (36.5 C) (04/09 0722) Pulse Rate:  [87-110] 88 (04/09 0722) Resp:  [12-18] 17 (04/09 0722) BP: (94-154)/(52-81) 94/53 (04/09 0722) SpO2:  [96 %-100 %] 98 % (04/09 0722)  Intake/Output from previous day: 04/08 0701 - 04/09 0700 In: 1850 [I.V.:1500; IV Piggyback:350] Out: 1300 [Urine:1200; Blood:100] Intake/Output this shift: No intake/output data recorded.  Physical Exam: Patient is awake, A/O X 4, conversant, and in good spirits. Speech is fluent and appropriate. Doing well. MAEW with good strength that is symmetric bilaterally. Dressing is clean dry intact. Incision is well approximated with no drainage, erythema, or edema.    Lab Results: Recent Labs    02/09/21 0650  WBC 7.4  HGB 10.3*  HCT 33.4*  PLT 142*   BMET Recent Labs    02/09/21 0650  NA 139  K 5.0  CL 109  CO2 21*  GLUCOSE 152*  BUN 21  CREATININE 1.29*  CALCIUM 9.3    Studies/Results: DG Lumbar Spine 2-3 Views  Result Date: 02/09/2021 CLINICAL DATA:  L5-S1 posterior lumbar interbody fusion. EXAM: DG C-ARM 1-60 MIN; LUMBAR SPINE - 2-3 VIEW FLUOROSCOPY TIME:  Fluoroscopy Time:  13.9 seconds. Radiation Exposure Index (if provided by the fluoroscopic device): 13.17 mGy. Number of Acquired Spot Images: 2 COMPARISON:  MRI August 31, 2020. FINDINGS: Two C-arm fluoroscopic images were obtained intraoperatively and submitted for post operative interpretation. These images demonstrate bilateral pedicle screws at L4, L5 and S1 with intervening L4-L5 and L5-S1 spacers. Please see the performing provider's procedural report for further detail. IMPRESSION: Intraoperative fluoroscopy, as detailed above. Electronically Signed   By: Margaretha Sheffield MD   On: 02/09/2021 11:07   DG C-Arm  1-60 Min  Result Date: 02/09/2021 CLINICAL DATA:  L5-S1 posterior lumbar interbody fusion. EXAM: DG C-ARM 1-60 MIN; LUMBAR SPINE - 2-3 VIEW FLUOROSCOPY TIME:  Fluoroscopy Time:  13.9 seconds. Radiation Exposure Index (if provided by the fluoroscopic device): 13.17 mGy. Number of Acquired Spot Images: 2 COMPARISON:  MRI August 31, 2020. FINDINGS: Two C-arm fluoroscopic images were obtained intraoperatively and submitted for post operative interpretation. These images demonstrate bilateral pedicle screws at L4, L5 and S1 with intervening L4-L5 and L5-S1 spacers. Please see the performing provider's procedural report for further detail. IMPRESSION: Intraoperative fluoroscopy, as detailed above. Electronically Signed   By: Margaretha Sheffield MD   On: 02/09/2021 11:07    Assessment/Plan: Patient is post-op day 1 s/p L5/S1 decompression and fusion. She is recovering well and reports a significant reduction of her preoperative symptoms.  Her only complaint is moderate incisional discomfort.  She has ambulated with PT who is recommending home health PT.  She is awaiting OT evaluation.  Continue LSO brace when OOB. Continue working on pain control, mobility and ambulating patient. Will plan for discharge today.    LOS: 0 days     Marvis Moeller, DNP, NP-C 02/10/2021, 8:00 AM

## 2021-02-10 NOTE — Care Management (Signed)
Spoke w patient at bedside, she declined [facility] services.

## 2021-02-10 NOTE — Progress Notes (Signed)
OT Cancellation Note  Patient Details Name: [name] MRN: [id number] DOB: July 01, 1946   Cancelled Treatment:    Reason Eval/Treat Not Completed: Patient not medically ready (Pt with very low blood pressure this AM deferring OT eval to another time. OT to continue to follow.)   [name], OTR/L Acute Rehabilitation Services Pager: [phone] Office: [phone]   [name] C 02/10/2021, 11:44 AM

## 2021-02-10 NOTE — TOC Initial Note (Signed)
Transition of Care [facility]) - Initial/Assessment Note    Patient Details  Name: [name] MRN: [id number] Date of Birth: [birth year]  [facility]) CM/SW Contact:    Ella Bodo, RN Phone Number: 02/10/2021, 4:52 PM  Clinical Narrative:   75 y/o female s/p PLIF L5-S1 on 4/8.  PTA, pt needs assistance with ADLS; states has roommate and adult daughter to assist at dc.  PT/OT recommending HH follow up, and pt now agreeable to services.  She has rollator RW at home, but requests [facility].  Referral to [facility] for PT/OT follow up; referral to [place] for DME needs.  3 in 1 to be delivered to bedside prior to dc.                Expected Discharge Plan: [place] Barriers to Discharge: Continued Medical Work up   Patient Goals and CMS Choice Patient states their goals for this hospitalization and ongoing recovery are:: to go home CMS Medicare.gov Compare Post Acute Care list provided to:: Patient Choice offered to / list presented to : Patient  Expected Discharge Plan and Services Expected Discharge Plan: [place]   Discharge Planning Services: CM Consult Post Acute Care Choice: [place] arrangements for the past 2 months: Single Family Home Expected Discharge Date: 02/10/21               DME Arranged: 3-N-1 DME Agency: [employer] Date DME Agency Contacted: 02/10/21 Time DME Agency Contacted: [phone] Representative spoke with at DME Agency: [place]: PT,OT [place]: [place] Date [place]: 02/10/21 Time [place]: 1651 Representative spoke with at [place]: Adela Lank  Prior Living Arrangements/Services Living arrangements for the past 2 months: [place] with:: [place] Patient language and need for interpreter reviewed:: Yes Do you feel safe going back to the place where you live?: Yes      Need for Family Participation in Patient  Care: Yes (Comment) Care giver support system in place?: Yes (comment)   Criminal Activity/Legal Involvement Pertinent to Current Situation/Hospitalization: No - Comment as needed  Activities of Daily Living Home Assistive Devices/Equipment: Gilford Rile (specify type) ADL Screening (condition at time of admission) Patient's cognitive ability adequate to safely complete daily activities?: Yes Is the patient deaf or have difficulty hearing?: No Does the patient have difficulty seeing, even when wearing glasses/contacts?: No Does the patient have difficulty concentrating, remembering, or making decisions?: No Patient able to express need for assistance with ADLs?: Yes Does the patient have difficulty dressing or bathing?: Yes Independently performs ADLs?: No Communication: Independent Dressing (OT): Needs assistance Is this a change from baseline?: Pre-admission baseline Grooming: Needs assistance Is this a change from baseline?: Pre-admission baseline Feeding: Independent Bathing: Needs assistance Is this a change from baseline?: Pre-admission baseline Toileting: Independent,Needs assistance Is this a change from baseline?: Pre-admission baseline In/Out Bed: Needs assistance Is this a change from baseline?: Pre-admission baseline Walks in Home: Needs assistance Is this a change from baseline?: Pre-admission baseline Does the patient have difficulty walking or climbing stairs?: Yes Weakness of Legs: Both Weakness of Arms/Hands: Both  Permission Sought/Granted   Permission granted to share information with : Yes, Verbal Permission Granted     Permission granted to share info w AGENCY: [facility]        Emotional Assessment Appearance:: Appears stated age Attitude/Demeanor/Rapport: Engaged Affect (typically observed): Accepting Orientation: : Oriented to Self,Oriented  to Place,Oriented to  Time,Oriented to Situation      Admission diagnosis:  Lumbar foraminal stenosis  [M48.061] Patient Active Problem List   Diagnosis Date Noted  . Lumbar foraminal stenosis 02/09/2021  . AKI (acute kidney injury) ([place]) 03/06/2020  . Hyperkalemia 03/06/2020  . Acute metabolic encephalopathy 19/62/2297  . Acute respiratory failure with hypoxia ([place]) 02/06/2020  . OSA (obstructive sleep apnea) 02/06/2020  . CPAP ventilation treatment not tolerated 02/06/2020  . Morbid obesity with BMI of 45.0-49.9, adult ([place]) 02/06/2020  . Lactic acidosis 02/06/2020  . possible Sepsis ([place]) 02/06/2020  . Polypharmacy 02/06/2020  . CAD (coronary artery disease) 05/05/2018  . Microscopic hematuria 09/17/2016  . Cystitis, chronic 09/17/2016  . 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 Closed fracture of metatarsal bone 07/22/2016  . Plantar fasciitis, left 05/28/2016  . Degenerative spondylolisthesis 01/02/2016  . Tachycardia 01/02/2016  . Diabetes mellitus with complication ([place])   . Essential hypertension   . Non-toxic multinodular goiter 10/11/2014  . Diabetes mellitus ([place]) 10/11/2014  . Hyperlipidemia 10/11/2014  . Anxiety and depression 10/11/2014  . Osteoporosis 10/11/2014  . GERD (gastroesophageal reflux disease) 10/11/2014  . Arthritis 10/11/2014  . Cystocele 10/11/2014  . Chronic pain syndrome 10/11/2014  . Amnesia 10/11/2014  . Back pain 10/11/2014   PCP:  Pcp, No Pharmacy:   [id number] [place] [place] - [address] [place] [address] Phone: [phone] Fax: [phone]     Social Determinants of Health (SDOH) Interventions    Readmission Risk Interventions Readmission Risk Prevention Plan 03/07/2020  Transportation Screening Complete  PCP or Specialist Appt within 3-5 Days Complete  HRI or [place] Complete  Social Work Consult for [place] Planning/Counseling Complete  Palliative Care Screening Not Applicable  Medication Review Press photographer) Complete  Some recent data might be hidden    Reinaldo Raddle, RN, BSN  Trauma/Neuro ICU Case Manager [phone]

## 2021-02-10 NOTE — Progress Notes (Signed)
Patient to transfer to 5N04. Report given to [facility]. Patient in no signs of distress at this time. Will follow up.

## 2021-02-10 NOTE — Plan of Care (Signed)
  Problem: Education: Goal: Ability to verbalize activity precautions or restrictions will improve Outcome: Progressing   Problem: Activity: Goal: Ability to avoid complications of mobility impairment will improve Outcome: Progressing   Problem: Bowel/Gastric: Goal: Gastrointestinal status for postoperative course will improve Outcome: Progressing   Problem: Clinical Measurements: Goal: Ability to maintain clinical measurements within normal limits will improve Outcome: Progressing   Problem: Pain Management: Goal: Pain level will decrease Outcome: Progressing   Problem: Skin Integrity: Goal: Will show signs of wound healing Outcome: Progressing   Problem: Health Behavior/Discharge Planning: Goal: Identification of resources available to assist in meeting health care needs will improve Outcome: Progressing   Problem: Bladder/Genitourinary: Goal: Urinary functional status for postoperative course will improve Outcome: Progressing

## 2021-02-10 NOTE — Evaluation (Signed)
Occupational Therapy Evaluation Patient Details Name: [name] MRN: [id number] DOB: [birth year] Today's Date: 02/10/2021    History of Present Illness 75 y/o female s/p PLIF L5-S1 on 4/8. PMH: anxiety, CHF, CKD, chronic pain syndrome, depression, DM type II, GERD, HTN, HLD, IBS, seizure, sleep apnea   Clinical Impression   Pt PTA: Pt living with daughter and reports assist from family for ADL. Pt currently, limited by decreased strength, decreased ability to care for self and increased pain. BP low in sitting at EOB; 90/48, 93 BPM and O2 >90%.  Pt set-upA to modA for ADL due to low BP and pt not feeling well throughout session. Back handout provided and reviewed adls in detail. Pt educated on: clothing between brace, never sleep in brace, set an alarm at night for medication, avoid sitting for long periods of time, correct bed positioning for sleeping, correct sequence for bed mobility, avoiding lifting more than 5 pounds and never wash directly over incision. All education is complete and patient indicates understanding. Pt would benefit from continued OT skilled services for ADL, mobility and safety in [place]. OT following acutely.    Follow Up Recommendations  Home health OT;Supervision/Assistance - 24 hour    Equipment Recommendations  3 in 1 bedside commode    Recommendations for Other Services       Precautions / Restrictions Precautions Precautions: Back;Fall Precaution Booklet Issued: Yes (comment) Precaution Comments: Pt too dizzy to state precautions; handout provided and gentle reminder of precuations Required Braces or Orthoses: Spinal Brace Spinal Brace: Lumbar corset;Applied in sitting position Restrictions Weight Bearing Restrictions: No      Mobility Bed Mobility Overal bed mobility: Needs Assistance Bed Mobility: Sit to Sidelying         Sit to sidelying: Min assist General bed mobility comments: assist to get back in bed for BLEs due to pt not  feeling well and weakness    Transfers Overall transfer level: Needs assistance Equipment used: Rolling walker (2 wheeled) Transfers: Sit to/from Stand Sit to Stand: Min guard         General transfer comment: use of RW    Balance Overall balance assessment: Needs assistance Sitting-balance support: Feet supported Sitting balance-Leahy Scale: Good     Standing balance support: Bilateral upper extremity supported Standing balance-Leahy Scale: Poor Standing balance comment: reliant on external support                           ADL either performed or assessed with clinical judgement   ADL Overall ADL's : Needs assistance/impaired Eating/Feeding: Set up;Sitting   Grooming: Set up;Sitting   Upper Body Bathing: Set up;Sitting   Lower Body Bathing: Set up;Sitting/lateral leans   Upper Body Dressing : Moderate assistance;Sitting   Lower Body Dressing: Maximal assistance   Toilet Transfer: Minimal assistance;Ambulation   Toileting- Clothing Manipulation and Hygiene: Maximal assistance;Sitting/lateral lean;Sit to/from stand       Functional mobility during ADLs: Minimal assistance;Rolling walker;Cueing for sequencing;Cueing for safety General ADL Comments: Pt limited by decreased strength, decreased ability to care for self and increased pain.     Vision Baseline Vision/History: No visual deficits Patient Visual Report: No change from baseline Vision Assessment?: No apparent visual deficits     Perception     Praxis      Pertinent Vitals/Pain Pain Assessment: Faces Faces Pain Scale: Hurts even more Pain Location: back, L hip Pain Descriptors / Indicators: Grimacing;Guarding;Aching Pain Intervention(s): Monitored during session;Limited  activity within patient's tolerance;Repositioned     Hand Dominance Right   Extremity/Trunk Assessment Upper Extremity Assessment Upper Extremity Assessment: Generalized weakness   Lower Extremity  Assessment Lower Extremity Assessment: Generalized weakness   Cervical / Trunk Assessment Cervical / Trunk Assessment: Other exceptions Cervical / Trunk Exceptions: back sx   Communication Communication Communication: No difficulties   Cognition Arousal/Alertness: Awake/alert Behavior During Therapy: WFL for tasks assessed/performed Overall Cognitive Status: Difficult to assess                                 General Comments: Pt feeling faint and unable to fully participate.   General Comments  BP low in sitting at EOB; 90/48, 93 BPM and O2 >90%.    Exercises     Shoulder Instructions      Home Living Family/patient expects to be discharged to:: Private residence Living Arrangements: Children;Other (Comment) Available Help at Discharge: Available 24 hours/day Type of Home: House Home Access: Level entry     Home Layout: One level     Bathroom Shower/Tub: Occupational psychologist: Standard     Home Equipment: Environmental consultant - 4 wheels   Additional Comments: Pt reports taking sink bath with daughter's assistance      Prior Functioning/Environment Level of Independence: Needs assistance  Gait / Transfers Assistance Needed: ambulates with rollator. Reports multiple falls in last 6 months ADL's / Homemaking Assistance Needed: needs assistance from daughter for sink baths            OT Problem List: Decreased strength;Decreased activity tolerance;Impaired balance (sitting and/or standing);Decreased coordination;Decreased cognition;Decreased safety awareness;Decreased knowledge of use of DME or AE;Impaired UE functional use;Pain;Increased edema      OT Treatment/Interventions: Self-care/ADL training;Therapeutic exercise;Energy conservation;DME and/or AE instruction;Therapeutic activities;Cognitive remediation/compensation;Patient/family education;Balance training    OT Goals(Current goals can be found in the care plan section) Acute Rehab OT  Goals Patient Stated Goal: to go home OT Goal Formulation: With patient/family Time For Goal Achievement: 02/24/21 Potential to Achieve Goals: Good ADL Goals Pt Will Perform Grooming: with supervision;standing Pt Will Perform Lower Body Dressing: with supervision;sit to/from stand;sitting/lateral leans;with adaptive equipment Pt Will Transfer to Toilet: with supervision;ambulating;grab bars Additional ADL Goal #1: Pt will state all back precautions without cues to assist.  OT Frequency: Min 2X/week   Barriers to D/C:            Co-evaluation              AM-PAC OT "6 Clicks" Daily Activity     Outcome Measure Help from another person eating meals?: None Help from another person taking care of personal grooming?: A Little Help from another person toileting, which includes using toliet, bedpan, or urinal?: A Lot Help from another person bathing (including washing, rinsing, drying)?: A Lot Help from another person to put on and taking off regular upper body clothing?: A Little Help from another person to put on and taking off regular lower body clothing?: A Lot 6 Click Score: 16   End of Session Equipment Utilized During Treatment: Gait belt;Rolling walker;Back brace Nurse Communication: Mobility status  Activity Tolerance: Patient limited by pain Patient left: in bed;with call bell/phone within reach;with bed alarm set;with family/visitor present;with nursing/sitter in room  OT Visit Diagnosis: Unsteadiness on feet (R26.81);Muscle weakness (generalized) (M62.81);Pain;Dizziness and giddiness (R42) Pain - part of body:  (back)  Time: 1230-1250 OT Time Calculation (min): 20 min Charges:  OT General Charges $OT Visit: 1 Visit OT Evaluation $OT Eval Moderate Complexity: 1 Mod  [name], OTR/L Acute Rehabilitation Services Pager: [phone] Office: [phone]   Lorae Roig C 02/10/2021, 8:48 PM

## 2021-02-10 NOTE — Progress Notes (Signed)
Physical Therapy Treatment Patient Details Name: [name] MRN: [id number] DOB: 1945-11-30 Today's Date: 02/10/2021    History of Present Illness 75 y/o female s/p PLIF L5-S1 on 4/8. PMH: anxiety, CHF, CKD, chronic pain syndrome, depression, DM type II, GERD, HTN, HLD, IBS, seizure, sleep apnea    PT Comments    Treatment limited due to hypotension. Pt transitioned to edge of bed with min guard assist. Once sitting, pt with increased difficulty with command following and keeping eyes closed. When questioned, pt endorsed dizziness/lightheadedness. BP 76/50 and then 76/58 when assessed ~1 minute later. Assisted back into supine position with RN present. Both RN/MD aware. Will continue to progress as tolerated.     Follow Up Recommendations  Home health PT;Supervision/Assistance - 24 hour     Equipment Recommendations  Rolling walker with 5" wheels;3in1 (PT)    Recommendations for Other Services       Precautions / Restrictions Precautions Precautions: Back;Fall Precaution Booklet Issued: Yes (comment) Required Braces or Orthoses: Spinal Brace Spinal Brace: Lumbar corset;Applied in sitting position Restrictions Weight Bearing Restrictions: No    Mobility  Bed Mobility Overal bed mobility: Needs Assistance Bed Mobility: Rolling;Sidelying to Sit;Sit to Sidelying Rolling: Min guard Sidelying to sit: Min guard     Sit to sidelying: Min assist General bed mobility comments: Cues on log roll technique, HOB elevated, use of bed bed rail. No physical assist for transition to sitting; minA for LE negotiation back into bed    Transfers                    Ambulation/Gait                 Stairs             Wheelchair Mobility    Modified Rankin (Stroke Patients Only)       Balance Overall balance assessment: Needs assistance Sitting-balance support: Feet supported Sitting balance-Leahy Scale: Good                                       Cognition Arousal/Alertness: Awake/alert Behavior During Therapy: WFL for tasks assessed/performed Overall Cognitive Status: Impaired/Different from baseline Area of Impairment: Attention;Following commands;Problem solving                   Current Attention Level: Sustained   Following Commands: Follows one step commands inconsistently     Problem Solving: Difficulty sequencing;Requires verbal cues;Requires tactile cues General Comments: Cognition likely impacted by low BP      Exercises General Exercises - Lower Extremity Ankle Circles/Pumps: AROM;Both;5 reps;Seated    General Comments        Pertinent Vitals/Pain Pain Assessment: Faces Faces Pain Scale: Hurts even more Pain Location: back, L hip Pain Descriptors / Indicators: Grimacing;Guarding;Aching Pain Intervention(s): Limited activity within patient's tolerance;Monitored during session    Home Living                      Prior Function            PT Goals (current goals can now be found in the care plan section) Acute Rehab PT Goals Patient Stated Goal: to go home PT Goal Formulation: With patient Time For Goal Achievement: 02/23/21 Potential to Achieve Goals: Good Progress towards PT goals: Not progressing toward goals - comment (hypotensive)    Frequency    Min 5X/week  PT Plan Current plan remains appropriate    Co-evaluation              AM-PAC PT "6 Clicks" Mobility   Outcome Measure  Help needed turning from your back to your side while in a flat bed without using bedrails?: A Little Help needed moving from lying on your back to sitting on the side of a flat bed without using bedrails?: A Little Help needed moving to and from a bed to a chair (including a wheelchair)?: A Little Help needed standing up from a chair using your arms (e.g., wheelchair or bedside chair)?: A Little Help needed to walk in hospital room?: A Little Help needed climbing 3-5 steps  with a railing? : A Little 6 Click Score: 18    End of Session Equipment Utilized During Treatment: Gait belt;Back brace Activity Tolerance: Treatment limited secondary to medical complications (Comment) (low BP) Patient left: in bed;with call bell/phone within reach Nurse Communication: Mobility status PT Visit Diagnosis: Unsteadiness on feet (R26.81);Muscle weakness (generalized) (M62.81)     Time: 3354-5625 PT Time Calculation (min) (ACUTE ONLY): 15 min  Charges:  $Therapeutic Activity: 8-22 mins                     [name], PT, DPT Acute Rehabilitation Services Pager [phone] Office [phone]    [name] 02/10/2021, 8:41 AM

## 2021-02-10 NOTE — Discharge Instructions (Signed)
Wound Care Keep incision covered and dry for two -three days.   Do not put any creams, lotions, or ointments on incision. Leave steri-strips on back.  They will fall off by themselves. Activity Walk each and every day, increasing distance each day. No lifting greater than 5 lbs.  Avoid excessive back bending. No driving for 2 weeks; may ride as a passenger locally. If provided with back brace, wear when out of bed.  It is not necessary to wear brace in bed. Diet Resume your normal diet.   Call Your Doctor If Any of These Occur Redness, drainage, or swelling at the wound.  Temperature greater than 101 degrees. Severe pain not relieved by pain medication. Incision starts to come apart. Follow Up Appt Call today for appointment in 1-2 weeks ([phone]) or for problems.  If you have any hardware placed in your spine, you will need an x-ray before your appointment.

## 2021-02-10 NOTE — Progress Notes (Signed)
Physical Therapy Treatment Patient Details Name: [name] MRN: [id number] DOB: 1946-03-30 Today's Date: 02/10/2021    History of Present Illness 75 y/o female s/p PLIF L5-S1 on 4/8. PMH: anxiety, CHF, CKD, chronic pain syndrome, depression, DM type II, GERD, HTN, HLD, IBS, seizure, sleep apnea    PT Comments    Pt seen for additional session to determine readiness to d/c home. Pt reports improved dizziness initially; had recently eaten lunch. BP sitting EOB 96/55. Ambulating x 80 feet with a walker at a min guard assist level and demonstrates slow, shuffling gait speed. Pt states she "feels weak." Assisted onto toilet, where pt then stated she felt like she was going to pass out. BP 90/48. Provided drink and cool washcloth. After a period of time, pt able to walk back to bed and return to supine. Increased sanguineous drainage from incision; RN present to change dressing. Will continue to progress as tolerated.     Follow Up Recommendations  Home health PT;Supervision/Assistance - 24 hour     Equipment Recommendations  Rolling walker with 5" wheels;3in1 (PT)    Recommendations for Other Services       Precautions / Restrictions Precautions Precautions: Back;Fall Precaution Booklet Issued: Yes (comment) Required Braces or Orthoses: Spinal Brace Spinal Brace: Lumbar corset;Applied in sitting position Restrictions Weight Bearing Restrictions: No    Mobility  Bed Mobility Overal bed mobility: Needs Assistance Bed Mobility: Rolling;Sidelying to Sit Rolling: Min guard Sidelying to sit: Min guard       General bed mobility comments: No physical assist required, HOB elevated 10 degrees, use of bed rail    Transfers Overall transfer level: Needs assistance Equipment used: Rolling walker (2 wheeled) Transfers: Sit to/from Stand Sit to Stand: Min guard         General transfer comment: Cues for hand positioning  Ambulation/Gait Ambulation/Gait assistance: Min  guard Gait Distance (Feet): 80 Feet Assistive device: Rolling walker (2 wheeled) Gait Pattern/deviations: Decreased stride length;Step-through pattern;Shuffle;Decreased dorsiflexion - right;Decreased dorsiflexion - left Gait velocity: decreased Gait velocity interpretation: <1.8 ft/sec, indicate of risk for recurrent falls General Gait Details: Shuffling gait pattern, decreased bilateral foot clearance and unable to correct despite cues, cues provided for sequencing/direction.   Stairs             Wheelchair Mobility    Modified Rankin (Stroke Patients Only)       Balance Overall balance assessment: Needs assistance Sitting-balance support: Feet supported Sitting balance-Leahy Scale: Good     Standing balance support: Bilateral upper extremity supported Standing balance-Leahy Scale: Poor Standing balance comment: reliant on external support                            Cognition Arousal/Alertness: Awake/alert Behavior During Therapy: WFL for tasks assessed/performed Overall Cognitive Status: Impaired/Different from baseline Area of Impairment: Attention;Following commands;Problem solving                   Current Attention Level: Sustained   Following Commands: Follows one step commands consistently     Problem Solving: Difficulty sequencing;Requires verbal cues General Comments: Cognition likely impacted by low BP, pain      Exercises      General Comments        Pertinent Vitals/Pain Pain Assessment: Faces Faces Pain Scale: Hurts even more Pain Location: back, L hip Pain Descriptors / Indicators: Grimacing;Guarding;Aching Pain Intervention(s): Limited activity within patient's tolerance;Monitored during session    Home Living  Prior Function            PT Goals (current goals can now be found in the care plan section) Acute Rehab PT Goals Patient Stated Goal: to go home PT Goal Formulation: With  patient Time For Goal Achievement: 02/23/21 Potential to Achieve Goals: Good    Frequency    Min 5X/week      PT Plan Current plan remains appropriate    Co-evaluation              AM-PAC PT "6 Clicks" Mobility   Outcome Measure  Help needed turning from your back to your side while in a flat bed without using bedrails?: A Little Help needed moving from lying on your back to sitting on the side of a flat bed without using bedrails?: A Little Help needed moving to and from a bed to a chair (including a wheelchair)?: A Little Help needed standing up from a chair using your arms (e.g., wheelchair or bedside chair)?: A Little Help needed to walk in hospital room?: A Little Help needed climbing 3-5 steps with a railing? : A Little 6 Click Score: 18    End of Session Equipment Utilized During Treatment: Gait belt;Back brace Activity Tolerance: Treatment limited secondary to medical complications (Comment) (low BP) Patient left: in bed;with call bell/phone within reach Nurse Communication: Mobility status PT Visit Diagnosis: Unsteadiness on feet (R26.81);Muscle weakness (generalized) (M62.81)     Time: 2094-7096 PT Time Calculation (min) (ACUTE ONLY): 29 min  Charges:  $Gait Training: 8-22 mins $Therapeutic Activity: 8-22 mins                     [name], PT, DPT Acute Rehabilitation Services Pager [phone] Office [phone]    [name] 02/10/2021, 12:55 PM

## 2021-02-10 NOTE — Discharge Summary (Signed)
Physician Discharge Summary  Patient ID: [name] MRN: [id number] DOB/AGE: 06-19-1946 75 y.o.  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 The patient tolerated the procedure well and was taken to the recovery room and then to the floor in stable condition. The hospital course was routine. There were no complications. The wound remained clean dry and intact. Pt had appropriate back soreness. No complaints of leg pain or new N/T/W. The patient remained afebrile with stable vital signs, and tolerated a regular diet. The patient continued to increase activities, and pain was well controlled with oral pain medications.   Consults: None  Significant Diagnostic Studies: radiology: X-ray  Treatments: surgery: Bilateral L5-S1 decompressive laminotomies and facetectomies with bilateral L5 and S1 foraminotomies, more than would be required for simple interbody fusion alone.   L5-S1 posterior lumbar interbody fusion utilizing interbody expandable titanium cages, morselized autograft and allograft.   L5-S1 posterior lateral arthrodesis utilizing segmental pedicle screw fixation from L4-S1 and local autografting.  Discharge Exam: Blood pressure (!) 94/53, pulse 88, temperature 97.7 F (36.5 C), temperature source Oral, resp. rate 17, height 5\' 3"  (1.6 m), weight 111.6 kg, SpO2 98  %.   Disposition: Discharge disposition: 01-Home or Self Care     Physical Exam: Patient is awake, A/O X 4, conversant, and in good spirits. Speech is fluent and appropriate. Doing well. MAEW with good strength that is symmetric bilaterally. Dressing is clean dry intact. Incision is well approximated with no drainage, erythema, or edema.  Discharge Instructions    Face-to-face encounter (required for Medicare/Medicaid patients)   Complete by: As directed    I Marvis Moeller certify that this patient is under my care and that I, or a nurse practitioner or physician's assistant working with me, had a face-to-face encounter that meets the physician face-to-face encounter requirements with this patient on 02/10/2021. 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 and lateral recess and foraminal stenosis with radiculopathy, status post prior L4-5 fusion with instrumentation   I certify that, based on my findings, the following services are medically necessary home health services: Physical therapy   Reason for Medically Necessary Home Health Services: Therapy- Therapeutic Exercises to Increase Strength and Endurance   My clinical findings support the need for the above services: Pain interferes with ambulation/mobility   Further, I certify that my clinical findings support that this patient is homebound due to: Ambulates short distances less than 300 feet   Home Health   Complete by: As directed    To  provide the following care/treatments:  PT     Allergies as of 02/10/2021   No Known Allergies     Medication List    TAKE these medications   aspirin EC 81 MG tablet Take 81 mg by mouth daily.   atorvastatin 40 MG tablet Commonly known as: LIPITOR Take 40 mg by mouth at bedtime.   busPIRone 30 MG tablet Commonly known as: BUSPAR Take 30 mg by mouth 2 (two) times daily.   cyclobenzaprine 10 MG tablet Commonly known as: FLEXERIL Take 1 tablet (10 mg total) by mouth 3 (three) times daily as needed for muscle spasms.   DULoxetine 60 MG capsule Commonly known as: CYMBALTA Take 60 mg by mouth in the morning.   gabapentin 300 MG capsule Commonly known as: NEURONTIN Take 1 capsule (300 mg total) by mouth 3 (three) times daily.   insulin glargine 100 UNIT/ML injection Commonly known as: LANTUS Inject 0.3 mLs (30 Units total) into the skin at bedtime. What changed: how much to take   levothyroxine 75 MCG tablet Commonly known as: SYNTHROID Take 1 tablet (75 mcg total) by mouth daily before breakfast.   lisinopril 20 MG tablet Commonly known as: ZESTRIL Take 20 mg by mouth daily.   metFORMIN 500 MG 24 hr tablet Commonly known as: GLUCOPHAGE-XR Take 1,000 mg by mouth 2 (two) times daily.   metoprolol succinate 50 MG 24 hr tablet Commonly known as: TOPROL-XL Take 50 mg by mouth in the morning and at bedtime. Take with or immediately following a meal.   oxyCODONE-acetaminophen 10-325 MG tablet Commonly known as: PERCOCET Take 1 tablet by mouth in the morning, at noon, in the evening, and at bedtime. What changed: Another medication with the same name was added. Make sure you understand how and when to take each.   oxyCODONE-acetaminophen 10-325 MG tablet Commonly known as: Percocet Take 1 tablet by mouth every 6 (six) hours as needed for pain. What changed: You were already taking a medication with the same name, and this prescription was added. Make sure you understand  how and when to take each.   QUEtiapine 300 MG tablet Commonly known as: SEROQUEL Take 300 mg by mouth at bedtime.            Durable Medical Equipment  (From admission, onward)         Start     Ordered   02/09/21 1238  DME Walker rolling  Once       Question:  Patient needs a walker to treat with the following condition  Answer:  Lumbar foraminal stenosis   02/09/21 1238   02/09/21 1238  DME 3 n 1  Once        02/09/21 1238           Signed: Marvis Moeller, DNP, NP-C 02/10/2021, 8:11 AM

## 2021-02-11 ENCOUNTER — Encounter (HOSPITAL_COMMUNITY): Payer: Self-pay | Admitting: Neurosurgery

## 2021-02-11 LAB — GLUCOSE, CAPILLARY
Glucose-Capillary: 137 mg/dL — ABNORMAL HIGH (ref 70–99)
Glucose-Capillary: 163 mg/dL — ABNORMAL HIGH (ref 70–99)
Glucose-Capillary: 138 mg/dL — ABNORMAL HIGH (ref 70–99)
Glucose-Capillary: 101 mg/dL — ABNORMAL HIGH (ref 70–99)

## 2021-02-11 NOTE — Progress Notes (Signed)
Physical Therapy Treatment Patient Details Name: [name] MRN: [id number] DOB: 1946/04/27 Today's Date: 02/11/2021    History of Present Illness 75 y/o female s/p PLIF L5-S1 on 4/8. PMH: anxiety, CHF, CKD, chronic pain syndrome, depression, DM type II, GERD, HTN, HLD, IBS, seizure, sleep apnea    PT Comments    Pt very flat with delayed processing, difficulty sequencing, and low BP inhibiting ambulation this date. Pt very delayed with response time. Noted drainage from incision in bed. Pt c/o lightheaded and dizziness with movement, BP dropped to 87/43 during PT session. Pt unsafe to d/c home at this time due to increased falls risk and the feeling of "passing out" with mobility. Despite being oriented pt demos impaired cognition as demo'd by inability to sequence rolling and std pvt transfer with maximal tactile and verbal cues, unable to discriminate from L/R, and delayed response time. Recommend another nights stay to allow for improved cognition and mobility to minimize falls risk. Acute PT to cont to follow.    Follow Up Recommendations  Home health PT;Supervision/Assistance - 24 hour     Equipment Recommendations  Rolling walker with 5" wheels;3in1 (PT)    Recommendations for Other Services       Precautions / Restrictions Precautions Precautions: Back;Fall Precaution Booklet Issued: Yes (comment) Precaution Comments: pt with poor recall, stated she can't roll or lift, pt re-educated but didn't recall at end of session Required Braces or Orthoses: Spinal Brace Spinal Brace: Lumbar corset;Applied in sitting position Restrictions Weight Bearing Restrictions: No    Mobility  Bed Mobility Overal bed mobility: Needs Assistance Bed Mobility: Rolling;Sidelying to Sit Rolling: Min assist Sidelying to sit: Min assist       General bed mobility comments: max directional verbal cues for sequencing log roll technique, minA for trunk elevation, increased time     Transfers Overall transfer level: Needs assistance Equipment used: Rolling walker (2 wheeled) Transfers: Sit to/from [facility] Sit to Stand: Min assist Stand pivot transfers: Min assist       General transfer comment: verbal cues for hand placement, increased time, min guard to stedy during transition of hands from bed to RW, max directional verbal cues to reach back for chair, eventually tactile cues, pt requiring max step by step cues to complete std pvt transfer, pt kept trying to turn in a circle and didn't stop in front of chair, pt with no processing of task asked  Ambulation/Gait             General Gait Details: unable to tolerate ambulation due to lightheadedness and dizziness   Stairs             Wheelchair Mobility    Modified Rankin (Stroke Patients Only)       Balance Overall balance assessment: Needs assistance Sitting-balance support: Feet supported Sitting balance-Leahy Scale: Good     Standing balance support: Bilateral upper extremity supported Standing balance-Leahy Scale: Poor Standing balance comment: reliant on external support                            Cognition Arousal/Alertness: Awake/alert Behavior During Therapy: WFL for tasks assessed/performed Overall Cognitive Status: Difficult to assess Area of Impairment: Attention;Following commands;Problem solving                   Current Attention Level: Sustained   Following Commands: Follows one step commands consistently;Follows one step commands with increased time  Problem Solving: Difficulty sequencing;Requires verbal cues General Comments: pt oriented x3 however had odd answers to questions ie. "I can't lift with my left side" pt requiring significant increased time and max verbal/tactile cues to sequence transfers, poor memory of precautions, unable to identify L/R      Exercises Other Exercises Other Exercises: incentive  spirometer    General Comments General comments (skin integrity, edema, etc.): BP: 101/51 with HOB at 30 deg, 87/43 upon sitting, 105/51 s/p 2 min of sitting, 101/49 once std pvt completed and pt sat in chair      Pertinent Vitals/Pain Pain Assessment: 0-10 Pain Score: 8  Pain Location: back, L hip Pain Descriptors / Indicators: Grimacing;Guarding;Aching Pain Intervention(s): Monitored during session    Home Living                      Prior Function            PT Goals (current goals can now be found in the care plan section) Acute Rehab PT Goals Patient Stated Goal: to go home PT Goal Formulation: With patient Time For Goal Achievement: 02/23/21 Potential to Achieve Goals: Good Progress towards PT goals: Not progressing toward goals - comment (limited by lightheaded dizziness)    Frequency    Min 5X/week      PT Plan Current plan remains appropriate    Co-evaluation              AM-PAC PT "6 Clicks" Mobility   Outcome Measure  Help needed turning from your back to your side while in a flat bed without using bedrails?: A Little Help needed moving from lying on your back to sitting on the side of a flat bed without using bedrails?: A Little Help needed moving to and from a bed to a chair (including a wheelchair)?: A Little Help needed standing up from a chair using your arms (e.g., wheelchair or bedside chair)?: A Little Help needed to walk in hospital room?: A Little Help needed climbing 3-5 steps with a railing? : A Little 6 Click Score: 18    End of Session Equipment Utilized During Treatment: Gait belt;Back brace Activity Tolerance: Patient limited by fatigue;Patient limited by lethargy (low BP) Patient left: with call bell/phone within reach;in chair;with chair alarm set Nurse Communication: Mobility status PT Visit Diagnosis: Unsteadiness on feet (R26.81);Muscle weakness (generalized) (M62.81)     Time: 1249-1310 PT Time Calculation  (min) (ACUTE ONLY): 21 min  Charges:  $Gait Training: 8-22 mins                     Kittie Plater, PT, DPT Acute Rehabilitation Services Pager #: [phone] Office #: [phone]    Berline Lopes 02/11/2021, 1:45 PM

## 2021-02-11 NOTE — Plan of Care (Signed)
  Problem: Safety: Goal: Ability to remain free from injury will improve 02/11/2021 1802 by Phebe Colla, RN Outcome: Progressing 02/11/2021 1802 by Phebe Colla, RN Outcome: Progressing   Problem: Education: Goal: Ability to verbalize activity precautions or restrictions will improve Outcome: Progressing   Problem: Activity: Goal: Ability to avoid complications of mobility impairment will improve Outcome: Progressing Goal: Ability to tolerate increased activity will improve Outcome: Progressing Goal: Will remain free from falls Outcome: Progressing   Problem: Pain Management: Goal: Pain level will decrease Outcome: Progressing   Problem: Bladder/Genitourinary: Goal: Urinary functional status for postoperative course will improve Outcome: Progressing

## 2021-02-11 NOTE — Progress Notes (Signed)
Occupational Therapy Treatment Patient Details Name: [name] MRN: [id number] DOB: 1946/02/05 Today's Date: 02/11/2021    History of present illness 75 y/o female s/p PLIF L5-S1 on 4/8. PMH: anxiety, CHF, CKD, chronic pain syndrome, depression, DM type II, GERD, HTN, HLD, IBS, seizure, sleep apnea   OT comments  Pt progressing slightly with BP and longer mobility distance ~30' x2 with RW, minguardA. Pt maxA for toilet hygiene and need to use RW for stability. Pt limited by varying BPs and pt overall feeling dizzy and "not good." Pt reported her pain in controlled when she does not move, but when she moves, she feels faint and very uncomfortable. Pt sitting EOB 106/90 and sitting on commode after activity 90/44; unable to tolerate standing for BP at this time. Pt introduced to LB ADL, but pt unable to properly attend due to pain.  Pt in recliner with needs in reach and chair alarm activated. RN aware of BP. Pt would benefit from continued OT skilled services. OT following acutely.   Follow Up Recommendations  Home health OT;Supervision/Assistance - 24 hour    Equipment Recommendations  3 in 1 bedside commode    Recommendations for Other Services      Precautions / Restrictions Precautions Precautions: Back;Fall Precaution Booklet Issued: Yes (comment) Precaution Comments: Pt too dizzy to state precautions; handout provided and gentle reminder of precautions Required Braces or Orthoses: Spinal Brace Spinal Brace: Lumbar corset;Applied in sitting position Restrictions Weight Bearing Restrictions: No       Mobility Bed Mobility Overal bed mobility: Needs Assistance Bed Mobility: Rolling;Sidelying to Sit Rolling: Supervision Sidelying to sit: Min assist       General bed mobility comments: use of rail for rolling; assist for trunk elevation due to pain'    Transfers Overall transfer level: Needs assistance Equipment used: Rolling walker (2 wheeled) Transfers: Sit to/from  Stand Sit to Stand: Min guard         General transfer comment: use of RW and momentum for power up    Balance Overall balance assessment: Needs assistance Sitting-balance support: Feet supported Sitting balance-Leahy Scale: Good     Standing balance support: Bilateral upper extremity supported Standing balance-Leahy Scale: Poor Standing balance comment: reliant on external support                           ADL either performed or assessed with clinical judgement   ADL Overall ADL's : Needs assistance/impaired                         Toilet Transfer: Minimal assistance;Ambulation Toilet Transfer Details (indicate cue type and reason): slow and steady, cues for hand placement Toileting- Clothing Manipulation and Hygiene: Maximal assistance;Sit to/from stand Toileting - Clothing Manipulation Details (indicate cue type and reason): maxA for pericare due to need to hold onto RW     Functional mobility during ADLs: Minimal assistance;Rolling walker;Cueing for sequencing;Cueing for safety General ADL Comments: Pt limited by varying BPs and pt overall feeling dizzy and "not good" Pt reported her pain in controlled when she does not move, but when she moves, she is  faint and very uncomfortable.     Vision   Vision Assessment?: No apparent visual deficits   Perception     Praxis      Cognition Arousal/Alertness: Awake/alert Behavior During Therapy: WFL for tasks assessed/performed Overall Cognitive Status: Difficult to assess  General Comments: Pt feeling faint and stating "the room is spinning" and unable to go beyond ambulating in bathroom to recliner.        Exercises Exercises: Other exercises Other Exercises Other Exercises: incentive spirometer   Shoulder Instructions       General Comments BPs: sitting EOB 106/90 and sitting on commode after activity 90/44.    Pertinent Vitals/ Pain        Pain Assessment: 0-10 Pain Score: 8  Pain Location: back, L hip Pain Descriptors / Indicators: Grimacing;Guarding;Aching Pain Intervention(s): Premedicated before session;Repositioned  Home Living                                          Prior Functioning/Environment              Frequency  Min 2X/week        Progress Toward Goals  OT Goals(current goals can now be found in the care plan section)  Progress towards OT goals: Progressing toward goals  Acute Rehab OT Goals Patient Stated Goal: to go home OT Goal Formulation: With patient/family Time For Goal Achievement: 02/24/21 Potential to Achieve Goals: Good ADL Goals Pt Will Perform Grooming: with supervision;standing Pt Will Perform Lower Body Dressing: with supervision;sit to/from stand;sitting/lateral leans;with adaptive equipment Pt Will Transfer to Toilet: with supervision;ambulating;grab bars Additional ADL Goal #1: Pt will state all back precautions without cues to assist.  Plan Discharge plan remains appropriate (would benefit from another day in hospital)    Co-evaluation                 AM-PAC OT "6 Clicks" Daily Activity     Outcome Measure   Help from another person eating meals?: None Help from another person taking care of personal grooming?: A Little Help from another person toileting, which includes using toliet, bedpan, or urinal?: A Lot Help from another person bathing (including washing, rinsing, drying)?: A Lot Help from another person to put on and taking off regular upper body clothing?: A Little Help from another person to put on and taking off regular lower body clothing?: A Lot 6 Click Score: 16    End of Session Equipment Utilized During Treatment: Rolling walker;Back brace  OT Visit Diagnosis: Unsteadiness on feet (R26.81);Muscle weakness (generalized) (M62.81);Pain;Dizziness and giddiness (R42) Pain - part of body:  (back)   Activity Tolerance Patient  limited by pain   Patient Left in chair;with call bell/phone within reach;with chair alarm set   Nurse Communication Mobility status        Time: 8657-8469 OT Time Calculation (min): 46 min  Charges: OT General Charges $OT Visit: 1 Visit OT Treatments $Self Care/Home Management : 23-37 mins $Therapeutic Activity: 8-22 mins  [name], OTR/L Acute Rehabilitation Services Pager: [phone] Office: [phone]    Suzan Manon C 02/11/2021, 10:38 AM

## 2021-02-11 NOTE — Plan of Care (Signed)
  Problem: Activity: Goal: Ability to avoid complications of mobility impairment will improve Outcome: Progressing   Problem: Clinical Measurements: Goal: Ability to maintain clinical measurements within normal limits will improve Outcome: Progressing   Problem: Pain Management: Goal: Pain level will decrease Outcome: Progressing   Problem: Skin Integrity: Goal: Will show signs of wound healing Outcome: Progressing   Problem: Health Behavior/Discharge Planning: Goal: Identification of resources available to assist in meeting health care needs will improve Outcome: Progressing   Problem: Bladder/Genitourinary: Goal: Urinary functional status for postoperative course will improve Outcome: Progressing

## 2021-02-11 NOTE — Progress Notes (Signed)
Pt states she does not feel comfortable going home today. Blood pressures continue to drop after getting up from bed. PT and OT worked with her and it dropped both times. She states she feels weak and dizzy.  Dr. was paged to update and let him know she did not want to go home. Dr. Marcello Moores called back and said to hold the discharge order. Incision site draining, and I have reinforced dressing with ABD pads. Dr stated to reinforce dressing also. Will continue to monitor patient and check blood pressures and monitor incision site.

## 2021-02-12 DIAGNOSIS — I951 Orthostatic hypotension: Secondary | ICD-10-CM | POA: Diagnosis not present

## 2021-02-12 DIAGNOSIS — M48061 Spinal stenosis, lumbar region without neurogenic claudication: Secondary | ICD-10-CM | POA: Diagnosis present

## 2021-02-12 DIAGNOSIS — K219 Gastro-esophageal reflux disease without esophagitis: Secondary | ICD-10-CM | POA: Diagnosis present

## 2021-02-12 DIAGNOSIS — Z20822 Contact with and (suspected) exposure to covid-19: Secondary | ICD-10-CM | POA: Diagnosis present

## 2021-02-12 DIAGNOSIS — Z7982 Long term (current) use of aspirin: Secondary | ICD-10-CM | POA: Diagnosis not present

## 2021-02-12 DIAGNOSIS — M4807 Spinal stenosis, lumbosacral region: Secondary | ICD-10-CM | POA: Diagnosis present

## 2021-02-12 DIAGNOSIS — Z981 Arthrodesis status: Secondary | ICD-10-CM | POA: Diagnosis not present

## 2021-02-12 DIAGNOSIS — I130 Hypertensive heart and chronic kidney disease with heart failure and stage 1 through stage 4 chronic kidney disease, or unspecified chronic kidney disease: Secondary | ICD-10-CM | POA: Diagnosis present

## 2021-02-12 DIAGNOSIS — Z6841 Body Mass Index (BMI) 40.0 and over, adult: Secondary | ICD-10-CM | POA: Diagnosis not present

## 2021-02-12 DIAGNOSIS — F419 Anxiety disorder, unspecified: Secondary | ICD-10-CM | POA: Diagnosis present

## 2021-02-12 DIAGNOSIS — G894 Chronic pain syndrome: Secondary | ICD-10-CM | POA: Diagnosis present

## 2021-02-12 DIAGNOSIS — M4317 Spondylolisthesis, lumbosacral region: Secondary | ICD-10-CM | POA: Diagnosis present

## 2021-02-12 DIAGNOSIS — Z7984 Long term (current) use of oral hypoglycemic drugs: Secondary | ICD-10-CM | POA: Diagnosis not present

## 2021-02-12 DIAGNOSIS — R569 Unspecified convulsions: Secondary | ICD-10-CM | POA: Diagnosis present

## 2021-02-12 DIAGNOSIS — G4730 Sleep apnea, unspecified: Secondary | ICD-10-CM | POA: Diagnosis present

## 2021-02-12 DIAGNOSIS — M5117 Intervertebral disc disorders with radiculopathy, lumbosacral region: Secondary | ICD-10-CM | POA: Diagnosis present

## 2021-02-12 DIAGNOSIS — E1122 Type 2 diabetes mellitus with diabetic chronic kidney disease: Secondary | ICD-10-CM | POA: Diagnosis present

## 2021-02-12 DIAGNOSIS — N189 Chronic kidney disease, unspecified: Secondary | ICD-10-CM | POA: Diagnosis present

## 2021-02-12 DIAGNOSIS — M47817 Spondylosis without myelopathy or radiculopathy, lumbosacral region: Secondary | ICD-10-CM | POA: Diagnosis present

## 2021-02-12 DIAGNOSIS — Z79890 Hormone replacement therapy (postmenopausal): Secondary | ICD-10-CM | POA: Diagnosis not present

## 2021-02-12 DIAGNOSIS — Z794 Long term (current) use of insulin: Secondary | ICD-10-CM | POA: Diagnosis not present

## 2021-02-12 DIAGNOSIS — Z79899 Other long term (current) drug therapy: Secondary | ICD-10-CM | POA: Diagnosis not present

## 2021-02-12 DIAGNOSIS — I509 Heart failure, unspecified: Secondary | ICD-10-CM | POA: Diagnosis present

## 2021-02-12 LAB — GLUCOSE, CAPILLARY
Glucose-Capillary: 151 mg/dL — ABNORMAL HIGH (ref 70–99)
Glucose-Capillary: 155 mg/dL — ABNORMAL HIGH (ref 70–99)

## 2021-02-12 MED FILL — Thrombin For Soln Kit 20000 Unit: CUTANEOUS | Qty: 1 | Status: AC

## 2021-02-12 NOTE — TOC Transition Note (Incomplete)
Transition of Care [facility]) - CM/SW Discharge Note   Patient Details  Name: [name] MRN: [id number] Date of Birth: Aug 19, 1946  [facility] CM/SW Contact:  Sharin Mons, RN Phone Number: 02/12/2021, 2:27 PM   Clinical Narrative:    Patient will DC to: Home Anticipated DC date: 02/12/2021 Family notified: yes, daughter Transport by: car   Per MD patient ready for DC today . RN, patient, patient's family, and  Meredeth Ide notified of DC.     RNCM will sign off for now as intervention is no longer needed. Please consult [place] again if new needs arise.   Final next level of care: [place] Barriers to Discharge: No Barriers Identified   Patient Goals and CMS Choice Patient states their goals for this hospitalization and ongoing recovery are:: to go home CMS Medicare.gov Compare Post Acute Care list provided to:: Patient Choice offered to / list presented to : Patient  Discharge Placement                       Discharge Plan and Services   Discharge Planning Services: CM Consult Post Acute Care Choice: Home Health          DME Arranged: 3-N-1 DME Agency: [employer] Date DME Agency Contacted: 02/10/21 Time DME Agency Contacted: [phone] Representative spoke with at DME Agency: [place]: PT,OT [place]: [place] Date [place]: 02/10/21 Time [place]: 1651 Representative spoke with at [place]: [place] ([place]) Interventions     Readmission Risk Interventions Readmission Risk Prevention Plan 03/07/2020  Transportation Screening Complete  PCP or Specialist Appt within 3-5 Days Complete  HRI or [place] Complete  Social Work Consult for [place] Planning/Counseling Complete  Palliative Care Screening Not Applicable  Medication Review Press photographer) Complete  Some recent data might be hidden

## 2021-02-12 NOTE — Progress Notes (Signed)
Discharge instructions addressed;Pt in stable condition; Back dressing reinforced; Pt picked up by 2 family members.

## 2021-02-12 NOTE — Progress Notes (Signed)
Neurosurgery  Pt seen and examined.  Patient appeared comfortably in bed.  5/5 strength in LEs.  There is an ABD pad over her lower back which is clean/dry.  I planned to discharge her today but she felt faint and weak with working with PT.  Her BP and HR have remained stable.  No leg pains/swelling or shortness of breath.  Will plan for 1 more day in hospital, likely d/c 4/11.

## 2021-02-12 NOTE — Progress Notes (Signed)
Physical Therapy Treatment Patient Details Name: [name] MRN: [id number] DOB: 1946/10/02 Today's Date: 02/12/2021    History of Present Illness 75 y/o female s/p PLIF L5-S1 on 4/8. PMH: anxiety, CHF, CKD, chronic pain syndrome, depression, DM type II, GERD, HTN, HLD, IBS, seizure, sleep apnea    PT Comments    Pt denied dizziness with mobility today however demonstrated cognitive regression. Pt began crying when PT was asking questions about home set up and stated "I just can't remember anything. Donnald Garre been having a hard time recently. I need my daughter up here I am disoriented." Pt with no recall of back precautions and was unable to don back brace without max verbal cues and assist as pt was donning it on inside out without realization despite max verbal cues. Pt continues to saturate dressing at incision site, had urinary incontinence while ambulating to the bathroom. While on the toilet pt stated "I just feel sick." pt very delayed response time and had difficulty sequencing. Pt NEEDS 24/7 ASSIST for safe d/c home. Pt is at significant falls risk and requires verbal cues to adhere to back precautions and assist for donning/doffing back brace. Acute PT to cont to follow.   Follow Up Recommendations  Home health PT;Supervision/Assistance - 24 hour     Equipment Recommendations  Rolling walker with 5" wheels;3in1 (PT)    Recommendations for Other Services       Precautions / Restrictions Precautions Precautions: Back;Fall Precaution Booklet Issued: Yes (comment) Precaution Comments: pt unable to recall any precautions Required Braces or Orthoses: Spinal Brace Spinal Brace: Lumbar corset;Applied in sitting position (pt requiring max verbal cues to don, pt tried to put on inside out and required max verbal cues to figure out she had it inside out) Restrictions Weight Bearing Restrictions: No    Mobility  Bed Mobility Overal bed mobility: Needs Assistance Bed Mobility:  Rolling;Sidelying to Sit Rolling: Min guard Sidelying to sit: Min guard       General bed mobility comments: pt with significant use of bed rail and requiring max directional verbal cues to complete task, pt unable to perform transfer without use of bed rail, pt would require increased assist    Transfers Overall transfer level: Needs assistance Equipment used: Rolling walker (2 wheeled) Transfers: Sit to/from Stand Sit to Stand: Min assist         General transfer comment: increased time, verbal cues to scoot forward and push up from bed/chair several time, verbal cues to minimize trunk flexion  Ambulation/Gait Ambulation/Gait assistance: Min guard Gait Distance (Feet): 10 Feet (x2, to/from bathroom) Assistive device: Rolling walker (2 wheeled) Gait Pattern/deviations: Decreased stride length;Step-through pattern;Shuffle;Decreased dorsiflexion - right;Decreased dorsiflexion - left Gait velocity: decreased Gait velocity interpretation: <1.8 ft/sec, indicate of risk for recurrent falls General Gait Details: pt with wide base of support waddle type gait pattern   Stairs             Wheelchair Mobility    Modified Rankin (Stroke Patients Only)       Balance Overall balance assessment: Needs assistance Sitting-balance support: Feet supported Sitting balance-Leahy Scale: Good     Standing balance support: Single extremity supported Standing balance-Leahy Scale: Poor Standing balance comment: pt attempted to perform pericare in standing, dependent on L UE for support                            Cognition Arousal/Alertness: Awake/alert Behavior During Therapy: [facility] for tasks  assessed/performed Overall Cognitive Status: No family/caregiver present to determine baseline cognitive functioning Area of Impairment: Orientation;Attention;Memory;Following commands;Safety/judgement;Awareness;Problem solving                 Orientation Level:  (pt stating  year for month) Current Attention Level: Focused Memory: Decreased recall of precautions;Decreased short-term memory (pt started crying stating "I've been having such a hard time recently remembering things") Following Commands: Follows one step commands with increased time Safety/Judgement: Decreased awareness of safety;Decreased awareness of deficits Awareness: Emergent Problem Solving: Slow processing;Difficulty sequencing;Requires verbal cues;Requires tactile cues General Comments: pt with no recall of precautions, pt with urinary incontinence all the way to the bathroom, pt requiring max verbal cues to perform pericare/hygiene s/p tolieting      Exercises      General Comments General comments (skin integrity, edema, etc.): pt has saturated ABD dressing at incision site, pt leaking all over the floor, urinary incontinent, socks changed      Pertinent Vitals/Pain Pain Assessment: Faces Faces Pain Scale: Hurts little more Pain Location: back Pain Descriptors / Indicators: Grimacing;Guarding;Aching Pain Intervention(s): Limited activity within patient's tolerance    Home Living                      Prior Function            PT Goals (current goals can now be found in the care plan section) Progress towards PT goals: Progressing toward goals    Frequency    Min 5X/week      PT Plan Current plan remains appropriate    Co-evaluation              AM-PAC PT "6 Clicks" Mobility   Outcome Measure  Help needed turning from your back to your side while in a flat bed without using bedrails?: A Little Help needed moving from lying on your back to sitting on the side of a flat bed without using bedrails?: A Little Help needed moving to and from a bed to a chair (including a wheelchair)?: A Little Help needed standing up from a chair using your arms (e.g., wheelchair or bedside chair)?: A Little Help needed to walk in hospital room?: A Little Help needed  climbing 3-5 steps with a railing? : A Lot 6 Click Score: 17    End of Session Equipment Utilized During Treatment: Gait belt;Back brace Activity Tolerance: Patient limited by fatigue;Patient limited by lethargy Patient left: in chair;with call bell/phone within reach;with chair alarm set Nurse Communication: Mobility status PT Visit Diagnosis: Unsteadiness on feet (R26.81);Muscle weakness (generalized) (M62.81)     Time: 6440-3474 PT Time Calculation (min) (ACUTE ONLY): 22 min  Charges:  $Gait Training: 8-22 mins                     Kittie Plater, PT, DPT Acute Rehabilitation Services Pager #: [phone] Office #: [phone]    Berline Lopes 02/12/2021, 1:55 PM

## 2021-02-12 NOTE — Discharge Summary (Signed)
Physician Discharge Summary     Providing Compassionate, Quality Care - Together   Patient ID: [name] MRN: [id number] DOB/AGE: October 05, 1946 75 y.o.  Admit date: 02/09/2021 Discharge date: 02/12/2021  Admission Diagnoses: Lumbar foraminal stenosis  Discharge Diagnoses:  Active Problems:   Lumbar foraminal stenosis   Discharged Condition: good  Hospital Course: Patient underwent an L5-S1 posterior fusion by Dr. Annette Stable on 02/09/2021. She was admitted to [facility] following recovery from anesthesia in the PACU. Her postoperative course was complicated by some orthostatic hypotension. This has improved following administration of IV fluids. She was transferred to 5N for further observation through Sunday. She has worked with both physical and occupational therapies who feel the patient is ready for discharge home with home health PT and OT. She is ambulating with assistance. She is tolerating a normal diet. She is not having any bowel or bladder dysfunction. Her pain is well-controlled with oral pain medication. She is ready for discharge home with home health.   Consults: rehabilitation medicine  Significant Diagnostic Studies: radiology: DG Lumbar Spine 2-3 Views  Result Date: 02/09/2021 CLINICAL DATA:  L5-S1 posterior lumbar interbody fusion. EXAM: DG C-ARM 1-60 MIN; LUMBAR SPINE - 2-3 VIEW FLUOROSCOPY TIME:  Fluoroscopy Time:  13.9 seconds. Radiation Exposure Index (if provided by the fluoroscopic device): 13.17 mGy. Number of Acquired Spot Images: 2 COMPARISON:  MRI August 31, 2020. FINDINGS: Two C-arm fluoroscopic images were obtained intraoperatively and submitted for post operative interpretation. These images demonstrate bilateral pedicle screws at L4, L5 and S1 with intervening L4-L5 and L5-S1 spacers. Please see the performing provider's procedural report for further detail. IMPRESSION: Intraoperative fluoroscopy, as detailed above. Electronically Signed   By: Margaretha Sheffield MD   On:  02/09/2021 11:07   DG C-Arm 1-60 Min  Result Date: 02/09/2021 CLINICAL DATA:  L5-S1 posterior lumbar interbody fusion. EXAM: DG C-ARM 1-60 MIN; LUMBAR SPINE - 2-3 VIEW FLUOROSCOPY TIME:  Fluoroscopy Time:  13.9 seconds. Radiation Exposure Index (if provided by the fluoroscopic device): 13.17 mGy. Number of Acquired Spot Images: 2 COMPARISON:  MRI August 31, 2020. FINDINGS: Two C-arm fluoroscopic images were obtained intraoperatively and submitted for post operative interpretation. These images demonstrate bilateral pedicle screws at L4, L5 and S1 with intervening L4-L5 and L5-S1 spacers. Please see the performing provider's procedural report for further detail. IMPRESSION: Intraoperative fluoroscopy, as detailed above. 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  L5-S1 posterior lumbar interbody fusion utilizing interbody expandable titanium cages, morselized autograft and allograft.  L5-S1 posterior lateral arthrodesis utilizing segmental pedicle screw fixation from L4-S1 and local autografting.  Discharge Exam: Blood pressure (!) 98/51, pulse 98, temperature 98.3 F (36.8 C), temperature source Oral, resp. rate 15, height 5\' 3"  (1.6 m), weight 111.6 kg, SpO2 95 %.  Alert and oriented x 4 PERRLA CN II-XII grossly intact MAE, Strength and sensation intact Incision is covered with gauze and Steri Strips; Dressing is clean, dry, and intact   Disposition:   Discharge Instructions    Face-to-face encounter (required for Medicare/Medicaid patients)    Complete by: As directed    I Marvis Moeller certify that  this patient is under my care and that I, or a nurse practitioner or physician's assistant working with me, had a face-to-face encounter that meets the physician face-to-face encounter requirements with this patient on 02/10/2021. 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 and lateral recess and foraminal stenosis with radiculopathy, status post prior L4-5 fusion with instrumentation   I certify that, based on my findings, the following services are medically necessary home health services: Physical therapy   Reason for Medically Necessary Home Health Services: Therapy- Therapeutic Exercises to Increase Strength and Endurance   My clinical findings support the need for the above services: Pain interferes with ambulation/mobility   Further, I certify that my clinical findings support that this patient is homebound due to: Ambulates short distances less than 300 feet   Home Health   Complete by: As directed    To provide the following care/treatments: PT     Allergies as of 02/12/2021   No Known Allergies     Medication List    TAKE these medications   aspirin EC 81 MG tablet Take 81 mg by mouth daily.   atorvastatin 40 MG tablet Commonly known as: LIPITOR Take 40 mg by mouth at bedtime.   busPIRone 30 MG tablet Commonly known as: BUSPAR Take 30 mg by mouth 2 (two) times daily.   cyclobenzaprine 10 MG tablet Commonly known as:  FLEXERIL Take 1 tablet (10 mg total) by mouth 3 (three) times daily as needed for muscle spasms.   DULoxetine 60 MG capsule Commonly known as: CYMBALTA Take 60 mg by mouth in the morning.   gabapentin 300 MG capsule Commonly known as: NEURONTIN Take 1 capsule (300 mg total) by mouth 3 (three) times daily.   insulin glargine 100 UNIT/ML injection Commonly known as: LANTUS Inject 0.3 mLs (30 Units total) into the skin at bedtime. What changed: how much to take   levothyroxine 75 MCG tablet Commonly known as: SYNTHROID Take 1 tablet (75 mcg total) by mouth daily before breakfast.   lisinopril 20 MG tablet Commonly known as: ZESTRIL Take 20 mg by mouth daily.   metFORMIN 500 MG 24 hr tablet Commonly known as: GLUCOPHAGE-XR Take 1,000 mg by mouth 2 (two) times daily.   metoprolol succinate 50 MG 24 hr tablet Commonly known as: TOPROL-XL Take 50 mg by mouth in the morning and at bedtime. Take with or immediately following a meal.   oxyCODONE-acetaminophen 10-325 MG tablet Commonly known as: Percocet Take 1 tablet by mouth every 6 (six) hours as needed for pain. What changed:   when to take this  reasons to take this   QUEtiapine 300 MG tablet Commonly known as: SEROQUEL Take 300 mg by mouth at bedtime.            Durable Medical Equipment  (From admission, onward)         Start     Ordered   02/09/21 1238  DME Walker rolling  Once       Question:  Patient needs a walker to treat  with the following condition  Answer:  Lumbar foraminal stenosis   02/09/21 1238   02/09/21 1238  DME 3 n 1  Once        02/09/21 1238          Follow-up Information    Earnie Larsson, MD Follow up.   Specialty: Neurosurgery Contact information: [address] [place] [address] [phone]        [facility] Follow up.   Specialty: Home Health Services Why: HHPT/OT arranged- they will contact you to set up home visits Contact  information: [place] [address] [phone]        [facility] Follow up.   Why: 3n1 arranged- to be delivered to room prior to discharge Contact information: [address] [place] [address] [phone]               Signed: Viona Gilmore, DNP, AGNP-C Nurse Practitioner  [facility] [place]. [address], [address], [place], [place] [address] P: [phone]    F: [phone]  02/12/2021, 12:07 PM

## 2021-02-15 ENCOUNTER — Emergency Department: Payer: Medicare Other

## 2021-02-15 ENCOUNTER — Other Ambulatory Visit: Payer: Self-pay

## 2021-02-15 ENCOUNTER — Inpatient Hospital Stay
Admission: EM | Admit: 2021-02-15 | Discharge: 2021-02-20 | DRG: 682 | Disposition: A | Discharge status: Home / Self Care | Payer: Medicare Other | Attending: Internal Medicine | Admitting: Internal Medicine

## 2021-02-15 DIAGNOSIS — Z794 Long term (current) use of insulin: Secondary | ICD-10-CM

## 2021-02-15 DIAGNOSIS — R9431 Abnormal electrocardiogram [ECG] [EKG]: Secondary | ICD-10-CM | POA: Diagnosis present

## 2021-02-15 DIAGNOSIS — Z96659 Presence of unspecified artificial knee joint: Secondary | ICD-10-CM | POA: Diagnosis present

## 2021-02-15 DIAGNOSIS — N189 Chronic kidney disease, unspecified: Secondary | ICD-10-CM

## 2021-02-15 DIAGNOSIS — I130 Hypertensive heart and chronic kidney disease with heart failure and stage 1 through stage 4 chronic kidney disease, or unspecified chronic kidney disease: Secondary | ICD-10-CM | POA: Diagnosis present

## 2021-02-15 DIAGNOSIS — R4182 Altered mental status, unspecified: Secondary | ICD-10-CM

## 2021-02-15 DIAGNOSIS — G894 Chronic pain syndrome: Secondary | ICD-10-CM | POA: Diagnosis present

## 2021-02-15 DIAGNOSIS — E1169 Type 2 diabetes mellitus with other specified complication: Secondary | ICD-10-CM

## 2021-02-15 DIAGNOSIS — F32A Depression, unspecified: Secondary | ICD-10-CM | POA: Diagnosis present

## 2021-02-15 DIAGNOSIS — Z66 Do not resuscitate: Secondary | ICD-10-CM | POA: Diagnosis present

## 2021-02-15 DIAGNOSIS — F410 Panic disorder [episodic paroxysmal anxiety] without agoraphobia: Secondary | ICD-10-CM | POA: Diagnosis present

## 2021-02-15 DIAGNOSIS — Z7982 Long term (current) use of aspirin: Secondary | ICD-10-CM

## 2021-02-15 DIAGNOSIS — M47812 Spondylosis without myelopathy or radiculopathy, cervical region: Secondary | ICD-10-CM | POA: Diagnosis present

## 2021-02-15 DIAGNOSIS — D329 Benign neoplasm of meninges, unspecified: Secondary | ICD-10-CM | POA: Diagnosis present

## 2021-02-15 DIAGNOSIS — Z6841 Body Mass Index (BMI) 40.0 and over, adult: Secondary | ICD-10-CM

## 2021-02-15 DIAGNOSIS — Z79890 Hormone replacement therapy (postmenopausal): Secondary | ICD-10-CM

## 2021-02-15 DIAGNOSIS — Z955 Presence of coronary angioplasty implant and graft: Secondary | ICD-10-CM

## 2021-02-15 DIAGNOSIS — I5032 Chronic diastolic (congestive) heart failure: Secondary | ICD-10-CM | POA: Diagnosis present

## 2021-02-15 DIAGNOSIS — I2510 Atherosclerotic heart disease of native coronary artery without angina pectoris: Secondary | ICD-10-CM | POA: Diagnosis present

## 2021-02-15 DIAGNOSIS — G4733 Obstructive sleep apnea (adult) (pediatric): Secondary | ICD-10-CM | POA: Diagnosis present

## 2021-02-15 DIAGNOSIS — D62 Acute posthemorrhagic anemia: Secondary | ICD-10-CM | POA: Diagnosis present

## 2021-02-15 DIAGNOSIS — N179 Acute kidney failure, unspecified: Secondary | ICD-10-CM | POA: Diagnosis not present

## 2021-02-15 DIAGNOSIS — K219 Gastro-esophageal reflux disease without esophagitis: Secondary | ICD-10-CM | POA: Diagnosis present

## 2021-02-15 DIAGNOSIS — Z20822 Contact with and (suspected) exposure to covid-19: Secondary | ICD-10-CM | POA: Diagnosis present

## 2021-02-15 DIAGNOSIS — N1831 Chronic kidney disease, stage 3a: Secondary | ICD-10-CM | POA: Diagnosis present

## 2021-02-15 DIAGNOSIS — G9341 Metabolic encephalopathy: Secondary | ICD-10-CM | POA: Diagnosis present

## 2021-02-15 DIAGNOSIS — Z79899 Other long term (current) drug therapy: Secondary | ICD-10-CM

## 2021-02-15 DIAGNOSIS — M810 Age-related osteoporosis without current pathological fracture: Secondary | ICD-10-CM | POA: Diagnosis present

## 2021-02-15 DIAGNOSIS — K589 Irritable bowel syndrome without diarrhea: Secondary | ICD-10-CM | POA: Diagnosis present

## 2021-02-15 DIAGNOSIS — Z90710 Acquired absence of both cervix and uterus: Secondary | ICD-10-CM

## 2021-02-15 DIAGNOSIS — E860 Dehydration: Secondary | ICD-10-CM | POA: Diagnosis present

## 2021-02-15 DIAGNOSIS — D500 Iron deficiency anemia secondary to blood loss (chronic): Secondary | ICD-10-CM

## 2021-02-15 DIAGNOSIS — E1122 Type 2 diabetes mellitus with diabetic chronic kidney disease: Secondary | ICD-10-CM | POA: Diagnosis present

## 2021-02-15 DIAGNOSIS — E559 Vitamin D deficiency, unspecified: Secondary | ICD-10-CM | POA: Diagnosis present

## 2021-02-15 DIAGNOSIS — E785 Hyperlipidemia, unspecified: Secondary | ICD-10-CM | POA: Diagnosis present

## 2021-02-15 DIAGNOSIS — Z9049 Acquired absence of other specified parts of digestive tract: Secondary | ICD-10-CM

## 2021-02-15 DIAGNOSIS — E039 Hypothyroidism, unspecified: Secondary | ICD-10-CM

## 2021-02-15 DIAGNOSIS — G4730 Sleep apnea, unspecified: Secondary | ICD-10-CM | POA: Diagnosis present

## 2021-02-15 LAB — URINALYSIS, COMPLETE (UACMP) WITH MICROSCOPIC
Bilirubin Urine: NEGATIVE
Glucose, UA: NEGATIVE mg/dL
Ketones, ur: NEGATIVE mg/dL
Leukocytes,Ua: NEGATIVE
Nitrite: NEGATIVE
Protein, ur: NEGATIVE mg/dL
Specific Gravity, Urine: 1.01 (ref 1.005–1.030)
pH: 5 (ref 5.0–8.0)

## 2021-02-15 LAB — COMPREHENSIVE METABOLIC PANEL
ALT: 17 U/L (ref 0–44)
AST: 23 U/L (ref 15–41)
Albumin: 3.4 g/dL — ABNORMAL LOW (ref 3.5–5.0)
Alkaline Phosphatase: 73 U/L (ref 38–126)
Anion gap: 10 (ref 5–15)
BUN: 46 mg/dL — ABNORMAL HIGH (ref 8–23)
CO2: 20 mmol/L — ABNORMAL LOW (ref 22–32)
Calcium: 9 mg/dL (ref 8.9–10.3)
Chloride: 111 mmol/L (ref 98–111)
Creatinine, Ser: 1.82 mg/dL — ABNORMAL HIGH (ref 0.44–1.00)
GFR, Estimated: 29 mL/min — ABNORMAL LOW (ref >60)
Glucose, Bld: 141 mg/dL — ABNORMAL HIGH (ref 70–99)
Potassium: 5.1 mmol/L (ref 3.5–5.1)
Sodium: 141 mmol/L (ref 135–145)
Total Bilirubin: 0.8 mg/dL (ref 0.3–1.2)
Total Protein: 7.1 g/dL (ref 6.5–8.1)

## 2021-02-15 LAB — CREATININE, SERUM
Creatinine, Ser: 1.32 mg/dL — ABNORMAL HIGH (ref 0.44–1.00)
GFR, Estimated: 43 mL/min — ABNORMAL LOW (ref >60)

## 2021-02-15 LAB — RESP PANEL BY RT-PCR (FLU A&B, COVID) ARPGX2
Influenza A by PCR: NEGATIVE
Influenza B by PCR: NEGATIVE
SARS Coronavirus 2 by RT PCR: NEGATIVE

## 2021-02-15 LAB — CBC
HCT: 26 % — ABNORMAL LOW (ref 36.0–46.0)
HCT: 26.8 % — ABNORMAL LOW (ref 36.0–46.0)
Hemoglobin: 8.3 g/dL — ABNORMAL LOW (ref 12.0–15.0)
Hemoglobin: 8.4 g/dL — ABNORMAL LOW (ref 12.0–15.0)
MCH: 29.7 pg (ref 26.0–34.0)
MCH: 29.2 pg (ref 26.0–34.0)
MCHC: 31.9 g/dL (ref 30.0–36.0)
MCHC: 31.3 g/dL (ref 30.0–36.0)
MCV: 93.2 fL (ref 80.0–100.0)
MCV: 93.1 fL (ref 80.0–100.0)
Platelets: 196 10*3/uL (ref 150–400)
Platelets: 214 10*3/uL (ref 150–400)
RBC: 2.79 MIL/uL — ABNORMAL LOW (ref 3.87–5.11)
RBC: 2.88 MIL/uL — ABNORMAL LOW (ref 3.87–5.11)
RDW: 15.5 % (ref 11.5–15.5)
RDW: 15.5 % (ref 11.5–15.5)
WBC: 8 10*3/uL (ref 4.0–10.5)
WBC: 8 10*3/uL (ref 4.0–10.5)
nRBC: 0 % (ref 0.0–0.2)
nRBC: 0.3 % — ABNORMAL HIGH (ref 0.0–0.2)

## 2021-02-15 MED ORDER — LEVOTHYROXINE SODIUM 50 MCG PO TABS
75.0000 ug | ORAL_TABLET | Freq: Every day | ORAL | Status: DC
  Administered 2021-02-16 – 2021-02-20 (×5): 75 ug via ORAL
  Filled 2021-02-15 (×5): qty 1

## 2021-02-15 MED ORDER — METOPROLOL SUCCINATE ER 25 MG PO TB24
25.0000 mg | ORAL_TABLET | Freq: Every day | ORAL | Status: DC
  Administered 2021-02-16 – 2021-02-19 (×5): 25 mg via ORAL
  Filled 2021-02-15 (×5): qty 1

## 2021-02-15 MED ORDER — INSULIN ASPART 100 UNIT/ML ~~LOC~~ SOLN
0.0000 [IU] | Freq: Every day | SUBCUTANEOUS | Status: DC
  Administered 2021-02-19: 3 [IU] via SUBCUTANEOUS
  Filled 2021-02-15 (×2): qty 1

## 2021-02-15 MED ORDER — DULOXETINE HCL 60 MG PO CPEP
60.0000 mg | ORAL_CAPSULE | Freq: Every morning | ORAL | Status: DC
  Administered 2021-02-16 – 2021-02-20 (×5): 60 mg via ORAL
  Filled 2021-02-15 (×5): qty 1

## 2021-02-15 MED ORDER — PANTOPRAZOLE SODIUM 40 MG PO TBEC
40.0000 mg | DELAYED_RELEASE_TABLET | Freq: Every day | ORAL | Status: DC
  Administered 2021-02-16 – 2021-02-20 (×5): 40 mg via ORAL
  Filled 2021-02-15 (×5): qty 1

## 2021-02-15 MED ORDER — SODIUM CHLORIDE 0.9 % IV BOLUS
500.0000 mL | Freq: Once | INTRAVENOUS | Status: AC
  Administered 2021-02-15: 500 mL via INTRAVENOUS

## 2021-02-15 MED ORDER — LACTATED RINGERS IV SOLN: INTRAVENOUS | Status: DC

## 2021-02-15 MED ORDER — HEPARIN SODIUM (PORCINE) 5000 UNIT/ML IJ SOLN
5000.0000 [IU] | Freq: Three times a day (TID) | INTRAMUSCULAR | Status: DC
  Administered 2021-02-15 – 2021-02-16 (×2): 5000 [IU] via SUBCUTANEOUS
  Filled 2021-02-15 (×2): qty 1

## 2021-02-15 MED ORDER — MONTELUKAST SODIUM 10 MG PO TABS
10.0000 mg | ORAL_TABLET | Freq: Every evening | ORAL | Status: DC
  Administered 2021-02-16 – 2021-02-19 (×4): 10 mg via ORAL
  Filled 2021-02-15 (×4): qty 1

## 2021-02-15 MED ORDER — MEMANTINE HCL ER 28 MG PO CP24
28.0000 mg | ORAL_CAPSULE | Freq: Every day | ORAL | Status: DC
  Administered 2021-02-16: 28 mg via ORAL
  Filled 2021-02-15: qty 1

## 2021-02-15 MED ORDER — INSULIN ASPART 100 UNIT/ML ~~LOC~~ SOLN
0.0000 [IU] | Freq: Three times a day (TID) | SUBCUTANEOUS | Status: DC
  Administered 2021-02-16: 1 [IU] via SUBCUTANEOUS
  Administered 2021-02-16 – 2021-02-17 (×2): 2 [IU] via SUBCUTANEOUS
  Administered 2021-02-17: 1 [IU] via SUBCUTANEOUS
  Administered 2021-02-17 – 2021-02-18 (×2): 2 [IU] via SUBCUTANEOUS
  Administered 2021-02-18: 1 [IU] via SUBCUTANEOUS
  Administered 2021-02-18: 3 [IU] via SUBCUTANEOUS
  Administered 2021-02-19: 2 [IU] via SUBCUTANEOUS
  Administered 2021-02-19: 6 [IU] via SUBCUTANEOUS
  Administered 2021-02-20: 3 [IU] via SUBCUTANEOUS
  Filled 2021-02-15 (×10): qty 1

## 2021-02-15 MED ORDER — ASPIRIN EC 81 MG PO TBEC
81.0000 mg | DELAYED_RELEASE_TABLET | Freq: Every day | ORAL | Status: DC
  Administered 2021-02-16: 81 mg via ORAL
  Filled 2021-02-15: qty 1

## 2021-02-15 MED ORDER — ATORVASTATIN CALCIUM 20 MG PO TABS
40.0000 mg | ORAL_TABLET | Freq: Every day | ORAL | Status: DC
  Administered 2021-02-16 – 2021-02-20 (×5): 40 mg via ORAL
  Filled 2021-02-15 (×5): qty 2

## 2021-02-15 NOTE — ED Notes (Signed)
Pt unable to sign MSE- e-sig due to condition.

## 2021-02-15 NOTE — H&P (Addendum)
History and Physical  [name] XHB:[id number] DOB: 04/25/1946 DOA: 02/15/2021  Referring physician: Dr. Archie Balboa, [place] PCP: [place]  Outpatient Specialists: Neurosurgery. Patient coming from: Home.  Chief Complaint: Altered mental status.  HPI: [name] is a 75 y.o. female with medical history significant for recent admission for lumbar surgery by Dr. Annette Stable on 02/09/2021, she was discharged to home with home health PT OT.  Her pain was well controlled at the time with oral pain medications, percocet.  She also has a history of essential hypertension, type 2 diabetes, hypothyroidism.  She presented today from home due to confusion, talking out of her head, and repeating her words x 2 days.  History is obtained from [name], her daughter via phone, and from review of medical history.  Per her daughter she has been altered for the last 2 days since she was released from the hospital.  Associated with 1 episode of vomiting 2 days ago.  Work-up in the ED was unrevealing.  CT head unremarkable for any intracranial findings.  No evidence of pyuria on UA.  No evidence of pneumonia on chest x-ray.  The patient is alert and oriented x2.  She knows where she is, she knows the name of the president but she thought the year was 2020.  She states she hurts all over.  TRH, hospitalist team was asked to admit.   ED Course:  T-max 99.2, BP 137/61, heart rate 103, respiratory 17, O2 saturation 99% on room air.  Lab studies remarkable for potassium 5.1, glucose 141, BUN 46, creatinine 1.82.  WBC 8.0, hemoglobin 8.3 from baseline of 10, platelet 196.  Review of Systems: Review of systems as noted in the HPI. All other systems reviewed and are negative.   Past Medical History:  Diagnosis Date  . Amnesia   . Anxiety   . Arthritis   . Back pain   . CHF (congestive heart failure) ([place])   . Chronic kidney disease    Acute on chronic 10/2020  . Chronic pain syndrome   . Cystocele   . Depression    . Diabetes mellitus without complication ([place])    Type II  . GERD (gastroesophageal reflux disease)   . History of blood transfusion    with knee placements  . Hyperlipidemia   . Hypertension   . IBS (irritable bowel syndrome)   . Obesity   . Osteoporosis   . Osteoporosis   . Panic attacks   . Seizure ([place])   . Shortness of breath dyspnea   . Sleep apnea    not using now  . Thyroid nodule    Past Surgical History:  Procedure Laterality Date  . ABDOMINAL HYSTERECTOMY    . APPENDECTOMY    . CARDIAC CATHETERIZATION    . CHOLECYSTECTOMY    . CORONARY STENT INTERVENTION N/A 05/05/2018   Procedure: CORONARY STENT INTERVENTION;  Surgeon: Isaias Cowman, MD;  Location: [facility];  Service: Cardiovascular;  Laterality: N/A;  . FOOT SURGERY    . LEFT HEART CATH AND CORONARY ANGIOGRAPHY Left 05/05/2018   Procedure: LEFT HEART CATH AND CORONARY ANGIOGRAPHY;  Surgeon: Isaias Cowman, MD;  Location: [facility];  Service: Cardiovascular;  Laterality: Left;  . OOPHORECTOMY    . REPLACEMENT TOTAL KNEE    . TONSILLECTOMY      Social History:  reports that she has never smoked. She has never used smokeless tobacco. She reports that she does not drink alcohol and does not  use drugs.   No Known Allergies  Family History  Problem Relation Age of Onset  . Cancer Mother   . Cancer Father   . Cancer Sister   . Thyroid disease Neg Hx       Prior to Admission medications   Medication Sig Start Date End Date Taking? Authorizing Provider  aspirin EC 81 MG tablet Take 81 mg by mouth daily.    [provider]  atorvastatin (LIPITOR) 40 MG tablet Take 40 mg by mouth at bedtime. 12/06/19   [provider]  busPIRone (BUSPAR) 30 MG tablet Take 30 mg by mouth 2 (two) times daily.    [provider]  cyclobenzaprine (FLEXERIL) 10 MG tablet Take 1 tablet (10 mg total) by mouth 3 (three) times daily as needed for muscle spasms. 02/10/21   Marvis Moeller, NP  DULoxetine (CYMBALTA) 60 MG capsule Take 60 mg by mouth in the morning.    [provider]  gabapentin (NEURONTIN) 300 MG capsule Take 1 capsule (300 mg total) by mouth 3 (three) times daily. 02/07/20   Lorella Nimrod, MD  insulin glargine (LANTUS) 100 UNIT/ML injection Inject 0.3 mLs (30 Units total) into the skin at bedtime. Patient taking differently: Inject 72 Units into the skin at bedtime. 06/16/20   Swayze, Ava, DO  levothyroxine (SYNTHROID) 75 MCG tablet Take 1 tablet (75 mcg total) by mouth daily before breakfast. 02/08/20   Lorella Nimrod, MD  lisinopril (ZESTRIL) 20 MG tablet Take 20 mg by mouth daily.    [provider]  metFORMIN (GLUCOPHAGE-XR) 500 MG 24 hr tablet Take 1,000 mg by mouth 2 (two) times daily. 01/18/20   [provider]  metoprolol succinate (TOPROL-XL) 50 MG 24 hr tablet Take 50 mg by mouth in the morning and at bedtime. Take with or immediately following a meal.    [provider]  oxyCODONE-acetaminophen (PERCOCET) 10-325 MG tablet Take 1 tablet by mouth every 6 (six) hours as needed for pain. 02/10/21 02/10/22  Marvis Moeller, NP  QUEtiapine (SEROQUEL) 300 MG tablet Take 300 mg by mouth at bedtime. 01/20/20   [provider]    Physical Exam: BP (!) 143/73   Pulse 99   Temp 98.5 F (36.9 C) (Oral)   Resp 18   Ht 5\' 3"  (1.6 m)   Wt 113.4 kg   SpO2 98%   BMI 44.29 kg/m   . General: 75 y.o. year-old female well developed well nourished in no acute distress.  Alert and oriented x2. . Cardiovascular: Mildly tachycardic with no rubs or gallops.  No thyromegaly or JVD noted.  No lower extremity edema. 2/4 pulses in all 4 extremities. Marland Kitchen Respiratory: Clear to auscultation with no wheezes or rales. Good inspiratory effort. . Abdomen: Soft nontender nondistended with normal bowel sounds x4 quadrants. . Muskuloskeletal: No cyanosis, clubbing or edema noted bilaterally . Neuro: CN II-XII intact, strength, sensation,  reflexes . Skin: No ulcerative lesions noted or rashes peripherally. Marland Kitchen Psychiatry: Judgement and insight appear altered. Mood is appropriate for condition and setting          Labs on Admission:  Basic Metabolic Panel: Recent Labs  Lab 02/09/21 0650 02/15/21 0936  NA 139 141  K 5.0 5.1  CL 109 111  CO2 21* 20*  GLUCOSE 152* 141*  BUN 21 46*  CREATININE 1.29* 1.82*  CALCIUM 9.3 9.0   Liver Function Tests: Recent Labs  Lab 02/15/21 0936  AST 23  ALT 17  ALKPHOS  73  BILITOT 0.8  PROT 7.1  ALBUMIN 3.4*   No results for input(s): LIPASE, AMYLASE in the last 168 hours. No results for input(s): AMMONIA in the last 168 hours. CBC: Recent Labs  Lab 02/09/21 0650 02/15/21 0936  WBC 7.4 8.0  NEUTROABS 4.0  --   HGB 10.3* 8.3*  HCT 33.4* 26.0*  MCV 96.3 93.2  PLT 142* 196   Cardiac Enzymes: No results for input(s): CKTOTAL, CKMB, CKMBINDEX, TROPONINI in the last 168 hours.  BNP (last 3 results) Recent Labs    03/06/20 0725  BNP 36.0    ProBNP (last 3 results) No results for input(s): PROBNP in the last 8760 hours.  CBG: Recent Labs  Lab 02/11/21 1144 02/11/21 1631 02/11/21 1939 02/12/21 0638 02/12/21 1209  GLUCAP 163* 138* 101* 151* 155*    Radiological Exams on Admission: CT Head Wo Contrast  Result Date: 02/15/2021 CLINICAL DATA:  Altered mental status. EXAM: CT HEAD WITHOUT CONTRAST TECHNIQUE: Contiguous axial images were obtained from the base of the skull through the vertex without intravenous contrast. COMPARISON:  June 12, 2020 FINDINGS: Brain: No evidence of acute infarction, hemorrhage, hydrocephalus, extra-axial collection or mass lesion/mass effect. Vascular: No hyperdense vessel or unexpected calcification. Skull: Normal. Negative for fracture or focal lesion. Sinuses/Orbits: No acute finding. Other: None. IMPRESSION: No acute intracranial abnormality. Electronically Signed   By: Whitney Salisbury M.D.   On: 02/15/2021 10:46   DG Chest  Portable 1 View  Result Date: 02/15/2021 CLINICAL DATA:  Altered mental status, recent postoperative lumbar surgery EXAM: PORTABLE CHEST 1 VIEW COMPARISON:  06/15/2020 FINDINGS: Redemonstrated cardiomegaly. Unchanged elevation of the right hemidiaphragm. The visualized skeletal structures are unremarkable. IMPRESSION: 1. Redemonstrated cardiomegaly. No acute abnormality of the lungs. 2. Unchanged elevation of the right hemidiaphragm. Electronically Signed   By: Eddie Candle M.D.   On: 02/15/2021 16:17    EKG: I independently viewed the EKG done and my findings are as followed: Sinus tachycardia rate of 112 nonspecific ST-T changes.  QTc 413.  Assessment/Plan Present on Admission: . AMS (altered mental status) . AMS (altered mental status)  Active Problems:   AMS (altered mental status)   AMS (altered mental status)  Acute metabolic encephalopathy, unclear etiology. Possibly multifactorial secondary to dehydration versus opiate induced Start gentle IV fluid hydration LR at 50 cc/h x 1 day. Reorient as needed Hold off opiates for now Obtain blood cultures x2 peripherally and MRSA screening. CT head unremarkable for any acute intracranial findings, UA negative for pyuria.  Chest x-ray no evidence of pneumonia. Delirium precautions. Fall precautions and aspiration precautions  AKI on CKD 3A Baseline creatinine appears to be 0.9 with GFR 58 Presented with creatinine of 1.83 with GFR 29. Start gentle IV fluid hydration lactated Ringer at 50 cc/h x 1 day Monitor urine output Avoid nephrotoxic agents, dehydration and hypotension. Repeat renal panel in the morning.  Non anion gap metabolic acidosis in the setting of acute kidney injury Presented with serum bicarb of 20 with anion gap of 10 Continue gentle IV fluid hydration Repeat renal panel in the morning.  Recent 1 episode of vomiting 2 days ago Reported by her daughter via phone. IV antiemetics PRN  Recent lumbar surgery by Dr.  Annette Stable on 02/09/2021  Neurosurgery consulted to look at post surgical wounds  Acute blood loss anemia post neurosurgery Baseline hemoglobin appears to be 10 Hemoglobin down to 8.3 Monitor H&H Transfuse for H&H less than 7.0.  Ambulatory dysfunction post lumbar spine surgery PT  OT to assess once her confusion has improved Continue fall precautions  Hyperlipidemia Resume home Lipitor  Chronic anxiety/depression Resume home duloxetine  Hypothyroidism Resume home Synthroid  Essential hypertension BP is currently stable Resume home Toprol-XL.    DVT prophylaxis: Subcu heparin 3 times daily.  Code Status: DNR as stated by her daughter via phone.  Family Communication: None at bedside.  Updated her daughter via phone.  Disposition Plan: Admit to MedSurg unit with remote telemetry.  Consults called: Neurosurgery.  Admission status: Observation status.   Status is: Observation    Dispo: The patient is from: Home.              Anticipated d/c is to: Home with home health services PT OT.               Patient currently not stable for discharge due to ongoing management of altered mental status.   Difficult to place patient, not applicable.       Whitney Memos MD [facility] Pager [phone]  If 7PM-7AM, please contact night-coverage [email or web address] Password [facility]  02/15/2021, 4:57 PM

## 2021-02-15 NOTE — ED Notes (Signed)
Dr. Cari Caraway at bedside.

## 2021-02-15 NOTE — ED Triage Notes (Addendum)
Pt to ER via [facility] from home with c/o AMS.  Pt had lumbar fusion 6 days ago at [facility]. Pt LKW unknown. Today patient doesn't follow commands, is alert but oriented to self and year only. Pt with history of HTN/ DM, but has not had meds for several days. Pt able to walk to EMS stretcher with assistance. Pt with new incontinence and dysuria. Pt also complains of lower back pain.   Ems VS- temp 98.1 axillary, O2 sats 98% on 2L, 91% on RA. BP 145/75, HR 108 ST. cbg 165.

## 2021-02-15 NOTE — ED Provider Notes (Signed)
[facility] Emergency Department Provider Note  ____________________________________________   I have reviewed the triage vital signs and the nursing notes.   HISTORY  Chief Complaint Altered Mental Status   History limited by and level 5 caveat due to: AMS  HPI [name] is a 75 y.o. female who presents to the emergency department today because of concern for altered mental status. Patient is unable to give history as to why she is here. The patient is not sure why she was brought to the emergency department. The patient does have complaint of back pain and recently did undergo lumbar surgery.    Records reviewed. Per medical record review patient has a history of surgery 6 days ago.   Past Medical History:  Diagnosis Date  . Amnesia   . Anxiety   . Arthritis   . Back pain   . CHF (congestive heart failure) ([place])   . Chronic kidney disease    Acute on chronic 10/2020  . Chronic pain syndrome   . Cystocele   . Depression   . Diabetes mellitus without complication ([place])    Type II  . GERD (gastroesophageal reflux disease)   . History of blood transfusion    with knee placements  . Hyperlipidemia   . Hypertension   . IBS (irritable bowel syndrome)   . Obesity   . Osteoporosis   . Osteoporosis   . Panic attacks   . Seizure ([place])   . Shortness of breath dyspnea   . Sleep apnea    not using now  . Thyroid nodule     Patient Active Problem List   Diagnosis Date Noted  . Lumbar foraminal stenosis 02/09/2021  . AKI (acute kidney injury) ([place]) 03/06/2020  . Hyperkalemia 03/06/2020  . Acute metabolic encephalopathy 57/84/6962  . Acute respiratory failure with hypoxia ([place]) 02/06/2020  . OSA (obstructive sleep apnea) 02/06/2020  . CPAP ventilation treatment not tolerated 02/06/2020  . Morbid obesity with BMI of 45.0-49.9, adult ([place]) 02/06/2020  . Lactic acidosis 02/06/2020  . possible Sepsis ([place]) 02/06/2020  . Polypharmacy 02/06/2020   . CAD (coronary artery disease) 05/05/2018  . Microscopic hematuria 09/17/2016  . Cystitis, chronic 09/17/2016  . Adrenal cortical nodule ([place]) LEFT, small 09/17/2016  . Closed fracture of metatarsal bone 07/22/2016  . Plantar fasciitis, left 05/28/2016  . Degenerative spondylolisthesis 01/02/2016  . Tachycardia 01/02/2016  . Diabetes mellitus with complication ([place])   . Essential hypertension   . Non-toxic multinodular goiter 10/11/2014  . Diabetes mellitus ([place]) 10/11/2014  . Hyperlipidemia 10/11/2014  . Anxiety and depression 10/11/2014  . Osteoporosis 10/11/2014  . GERD (gastroesophageal reflux disease) 10/11/2014  . Arthritis 10/11/2014  . Cystocele 10/11/2014  . Chronic pain syndrome 10/11/2014  . Amnesia 10/11/2014  . Back pain 10/11/2014    Past Surgical History:  Procedure Laterality Date  . ABDOMINAL HYSTERECTOMY    . APPENDECTOMY    . CARDIAC CATHETERIZATION    . CHOLECYSTECTOMY    . CORONARY STENT INTERVENTION N/A 05/05/2018   Procedure: CORONARY STENT INTERVENTION;  Surgeon: Isaias Cowman, MD;  Location: [place] [facility];  Service: Cardiovascular;  Laterality: N/A;  . FOOT SURGERY    . LEFT HEART CATH AND CORONARY ANGIOGRAPHY Left 05/05/2018   Procedure: LEFT HEART CATH AND CORONARY ANGIOGRAPHY;  Surgeon: Isaias Cowman, MD;  Location: [place] [facility];  Service: Cardiovascular;  Laterality: Left;  . OOPHORECTOMY    . REPLACEMENT TOTAL KNEE    . TONSILLECTOMY  Prior to Admission medications   Medication Sig Start Date End Date Taking? Authorizing Provider  aspirin EC 81 MG tablet Take 81 mg by mouth daily.    [provider]  atorvastatin (LIPITOR) 40 MG tablet Take 40 mg by mouth at bedtime. 12/06/19   [provider]  busPIRone (BUSPAR) 30 MG tablet Take 30 mg by mouth 2 (two) times daily.    [provider]  cyclobenzaprine (FLEXERIL) 10 MG tablet Take 1 tablet (10 mg total) by mouth 3 (three) times daily as  needed for muscle spasms. 02/10/21   Marvis Moeller, NP  DULoxetine (CYMBALTA) 60 MG capsule Take 60 mg by mouth in the morning.    [provider]  gabapentin (NEURONTIN) 300 MG capsule Take 1 capsule (300 mg total) by mouth 3 (three) times daily. 02/07/20   Lorella Nimrod, MD  insulin glargine (LANTUS) 100 UNIT/ML injection Inject 0.3 mLs (30 Units total) into the skin at bedtime. Patient taking differently: Inject 72 Units into the skin at bedtime. 06/16/20   Swayze, Ava, DO  levothyroxine (SYNTHROID) 75 MCG tablet Take 1 tablet (75 mcg total) by mouth daily before breakfast. 02/08/20   Lorella Nimrod, MD  lisinopril (ZESTRIL) 20 MG tablet Take 20 mg by mouth daily.    [provider]  metFORMIN (GLUCOPHAGE-XR) 500 MG 24 hr tablet Take 1,000 mg by mouth 2 (two) times daily. 01/18/20   [provider]  metoprolol succinate (TOPROL-XL) 50 MG 24 hr tablet Take 50 mg by mouth in the morning and at bedtime. Take with or immediately following a meal.    [provider]  oxyCODONE-acetaminophen (PERCOCET) 10-325 MG tablet Take 1 tablet by mouth every 6 (six) hours as needed for pain. 02/10/21 02/10/22  Marvis Moeller, NP  QUEtiapine (SEROQUEL) 300 MG tablet Take 300 mg by mouth at bedtime. 01/20/20   [provider]    Allergies Patient has no known allergies.  Family History  Problem Relation Age of Onset  . Cancer Mother   . Cancer Father   . Cancer Sister   . Thyroid disease Neg Hx     Social History Social History   Tobacco Use  . Smoking status: Never Smoker  . Smokeless tobacco: Never Used  Vaping Use  . Vaping Use: Never used  Substance Use Topics  . Alcohol use: Never  . Drug use: Never    Review of Systems ROS unable to be obtained secondary to AMS.  ____________________________________________   PHYSICAL EXAM:  VITAL SIGNS: ED Triage Vitals  Enc Vitals Group     BP 02/15/21 0930 (!) 128/56     Pulse Rate 02/15/21 0930 (!) 102      Resp --      Temp 02/15/21 0930 99.2 F (37.3 C)     Temp Source 02/15/21 0930 Oral     SpO2 02/15/21 0930 97 %     Weight 02/15/21 0933 250 lb (113.4 kg)     Height 02/15/21 0933 5\' 3"  (1.6 m)   Constitutional: Alert and oriented to name, not events.  Eyes: Conjunctivae are normal.  ENT      Head: Normocephalic and atraumatic.      Nose: No congestion/rhinnorhea.      Mouth/Throat: Mucous membranes are moist.      Neck: No stridor. Hematological/Lymphatic/Immunilogical: No cervical lymphadenopathy. Cardiovascular: Normal rate, regular rhythm.  No murmurs, rubs, or gallops.  Respiratory: Normal respiratory effort without tachypnea nor retractions. Breath sounds are clear and  equal bilaterally. No wheezes/rales/rhonchi. Gastrointestinal: Soft and non tender. No rebound. No guarding.  Genitourinary: Deferred Musculoskeletal: Normal range of motion in all extremities. No lower extremity edema. Neurologic:  Not completely oriented. Moving all extremities.  Skin:  Surgical incision without any erythema or pus.   ____________________________________________    LABS (pertinent positives/negatives)  CMP na 141, k 5.1, glu 141, cr 1.82 UA clear, small hgb dipstick, 0-5 rbc and wbc CBC wbc 8.0, hgb 8.3, plt 196   ____________________________________________   EKG  I, Nance Pear, attending physician, personally viewed and interpreted this EKG  EKG Time: 0939 Rate: 102 Rhythm: sinus tachycardia Axis: normal Intervals: qtc 413 QRS: narrow, low voltage, q waves III, avf ST changes: no st elevation Impression: abnormal ekg   ____________________________________________    RADIOLOGY  CT head No acute intracranial abnormality  ____________________________________________   PROCEDURES  Procedures  ____________________________________________   INITIAL IMPRESSION / ASSESSMENT AND PLAN / ED COURSE  Pertinent labs & imaging results that were available  during my care of the patient were reviewed by me and considered in my medical decision making (see chart for details).   Patient presented to the emergency department today because of concern for AMS. Patient unable to give any significant history. Did discuss with Daughter over the phone who was unable to give great history but states that normally her mother is able to hold a conversation and is alert and oriented. Did mention to the daughter that PT noted confusion a few days ago but it is unclear if she had that until today at home. Work up here in the emergency department without any clear etiology of confusion, however given report from daughter that she is altered will plan on admission.  ____________________________________________   FINAL CLINICAL IMPRESSION(S) / ED DIAGNOSES  Final diagnoses:  Altered mental status, unspecified altered mental status type     Note: This dictation was prepared with Dragon dictation. Any transcriptional errors that result from this process are unintentional     Nance Pear, MD 02/15/21 1541

## 2021-02-15 NOTE — ED Notes (Signed)
rn informed of bed assignment

## 2021-02-15 NOTE — ED Notes (Signed)
Purewick failed. Patient was cleaned, linens were changed and new Purewick was placed. Brief is in place. Patient's dressing over surgical wound is loose. Admitting hospitalist informed.

## 2021-02-15 NOTE — ED Notes (Signed)
High fall precautions in place including bed alarm, side rails x2, fall risk bracelet. Call light in reach. Continuous cardiac and pulse ox monitoring in use.

## 2021-02-15 NOTE — Consult Note (Addendum)
Referring Physician:  No referring provider defined for this encounter.  Primary Physician:  [place]  Chief Complaint:  Altered mental status  History of Present Illness: 02/15/2021 [name] is a 75 y.o. female who presents with the chief complaint of altered mental status.  She was confused today at home, prompting ED visit.  Her daughter was concerned because of her mental status.  The patient is unable to give full history, but seems confused and expressed that she is confused. She denies increase in back or leg pain.  She denies numbness, but expressed that she is having trouble walking.  She had a revision and extension L5-S1 PLIF with L4-S1 instrumentation on February 09, 2021 by Dr. Annette Stable at [facility].  She was discharged on POD3 with home health.  Review of Systems:  A 10 point review of systems is negative, except for the pertinent positives and negatives detailed in the HPI.  Past Medical History: Past Medical History:  Diagnosis Date  . Amnesia   . Anxiety   . Arthritis   . Back pain   . CHF (congestive heart failure) ([place])   . Chronic kidney disease    Acute on chronic 10/2020  . Chronic pain syndrome   . Cystocele   . Depression   . 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 GERD (gastroesophageal reflux disease)   . History of blood transfusion    with knee placements  . Hyperlipidemia   . Hypertension   . IBS (irritable bowel syndrome)   . Obesity   . Osteoporosis   . Osteoporosis   . Panic attacks   . Seizure ([place])   . Shortness of breath dyspnea   . Sleep apnea    not using now  . Thyroid nodule     Past Surgical History: Past Surgical History:  Procedure Laterality Date  . ABDOMINAL HYSTERECTOMY    . APPENDECTOMY    . CARDIAC CATHETERIZATION    . CHOLECYSTECTOMY    . CORONARY STENT INTERVENTION N/A 05/05/2018   Procedure: CORONARY STENT INTERVENTION;  Surgeon: Isaias Cowman, MD;  Location: [facility];  Service: Cardiovascular;  Laterality: N/A;  . FOOT SURGERY    . LEFT HEART CATH AND CORONARY ANGIOGRAPHY Left 05/05/2018   Procedure: LEFT HEART CATH AND CORONARY ANGIOGRAPHY;  Surgeon: Isaias Cowman, MD;  Location: [facility];  Service: Cardiovascular;  Laterality: Left;  . OOPHORECTOMY    . REPLACEMENT TOTAL KNEE    . TONSILLECTOMY      Allergies: Allergies as of 02/15/2021  . (No Known Allergies)    Medications:  Current Facility-Administered Medications:  .  heparin injection 5,000 Units, 5,000 Units, Subcutaneous, Q8H, Hall, Carole N, DO .  lactated ringers infusion, , Intravenous, Continuous, Hall, Carole N, DO  Current Outpatient Medications:  .  aspirin EC 81 MG tablet, Take 81 mg by mouth daily., Disp: , Rfl:  .  atorvastatin (LIPITOR) 40 MG tablet, Take 40 mg by mouth at bedtime., Disp: , Rfl:  .  busPIRone (BUSPAR) 30 MG tablet, Take 30 mg by mouth 2 (two) times daily., Disp: , Rfl:  .  cyclobenzaprine (FLEXERIL) 10 MG tablet, Take 1 tablet (10 mg total) by mouth 3 (three) times daily as needed for muscle spasms., Disp: 30 tablet, Rfl: 0 .  DULoxetine (CYMBALTA) 60 MG capsule, Take 60 mg by mouth in the morning., Disp: , Rfl:  .  gabapentin (NEURONTIN) 300 MG  capsule, Take 1 capsule (300 mg total) by mouth 3 (three) times daily., Disp: 90 capsule, Rfl: 0 .  insulin glargine (LANTUS) 100 UNIT/ML injection, Inject 0.3 mLs (30 Units total) into the skin at bedtime. (Patient taking differently: Inject 72 Units into the skin at bedtime.), Disp: 10 mL, Rfl: 11 .  levothyroxine (SYNTHROID) 75 MCG tablet, Take 1 tablet (75 mcg total) by mouth daily before breakfast., Disp: 30 tablet, Rfl: 1 .  lisinopril (ZESTRIL) 20 MG tablet, Take 20 mg by mouth daily., Disp: , Rfl:  .  metFORMIN (GLUCOPHAGE-XR) 500 MG 24 hr tablet, Take 1,000 mg by mouth 2 (two) times daily., Disp: , Rfl:  .  metoprolol succinate (TOPROL-XL) 50 MG 24 hr tablet, Take 50 mg by mouth  in the morning and at bedtime. Take with or immediately following a meal., Disp: , Rfl:  .  oxyCODONE-acetaminophen (PERCOCET) 10-325 MG tablet, Take 1 tablet by mouth every 6 (six) hours as needed for pain., Disp: 20 tablet, Rfl: 0 .  QUEtiapine (SEROQUEL) 300 MG tablet, Take 300 mg by mouth at bedtime., Disp: , Rfl:    Social History: Social History   Tobacco Use  . Smoking status: Never Smoker  . Smokeless tobacco: Never Used  Vaping Use  . Vaping Use: Never used  Substance Use Topics  . Alcohol use: Never  . Drug use: Never    Family Medical History: Family History  Problem Relation Age of Onset  . Cancer Mother   . Cancer Father   . Cancer Sister   . Thyroid disease Neg Hx     Physical Examination: Vitals:   02/15/21 1400 02/15/21 1430  BP: (!) 142/73 (!) 143/73  Pulse: 98 99  Resp: 16 18  Temp:    SpO2: 96% 98%     General: Patient is well developed, well nourished, anxious and in mild distress.  Psychiatric: Patient is anxious.  Head:  Pupils equal, round, and reactive to light.  ENT:  Oral mucosa appears well hydrated.  Neck:   Supple.  Full range of motion.  Respiratory: Patient is breathing without any difficulty.  Extremities: No edema.  Vascular: Palpable pulses in dorsal pedal vessels.  Skin:   On exposed skin, there are no abnormal skin lesions.  NEUROLOGICAL:  General: In no acute distress.   Awake, alert, oriented to person, place, and "April 2022."  Pupils equal round and reactive to light.  Facial tone is symmetric.  Tongue protrusion is midline.  There is no pronator drift.   Strength: Side Biceps Triceps Deltoid Interossei Grip Wrist Ext. Wrist Flex.  R 5 5 5 5 5 5 5   L 5 5 5 5 5 5 5    Side Iliopsoas Quads Hamstring PF DF EHL  R 5 5 5 5 5 5   L 5 5 5 5 5 5     Bilateral upper and lower extremity sensation is intact to light touch. Reflexes are 1+ and symmetric at the biceps, triceps, brachioradialis, patella and achilles.  Hoffman's is absent.  Clonus is not present.  Toes are down-going.    Gait is untested.    I removed her dressings and steristrips.  The incision appears to be healing well without erythema or induration.  Imaging: CT Head 02/15/2021 IMPRESSION: No acute intracranial abnormality.   Electronically Signed   By: Fidela Salisbury M.D.   On: 02/15/2021 10:46  I have personally reviewed the images and agree with the above interpretation.  Labs: CBC Latest Ref Rng &  Units 02/15/2021 02/09/2021 06/16/2020  WBC 4.0 - 10.5 K/uL 8.0 7.4 7.6  Hemoglobin 12.0 - 15.0 g/dL 8.3(L) 10.3(L) 10.1(L)  Hematocrit 36.0 - 46.0 % 26.0(L) 33.4(L) 30.4(L)  Platelets 150 - 400 K/uL 196 142(L) 140(L)    CMP Latest Ref Rng & Units 02/15/2021 02/09/2021 06/16/2020  Glucose 70 - 99 mg/dL 141(H) 152(H) 188(H)  BUN 8 - 23 mg/dL 46(H) 21 19  Creatinine 0.44 - 1.00 mg/dL 1.82(H) 1.29(H) 0.97  Sodium 135 - 145 mmol/L 141 139 145  Potassium 3.5 - 5.1 mmol/L 5.1 5.0 3.5  Chloride 98 - 111 mmol/L 111 109 109  CO2 22 - 32 mmol/L 20(L) 21(L) 25  Calcium 8.9 - 10.3 mg/dL 9.0 9.3 8.8(L)  Total Protein 6.5 - 8.1 g/dL 7.1 - -  Total Bilirubin 0.3 - 1.2 mg/dL 0.8 - -  Alkaline Phos 38 - 126 U/L 73 - -  AST 15 - 41 U/L 23 - -  ALT 0 - 44 U/L 17 - -      Assessment and Plan: Ms. Magadan is a pleasant 75 y.o. female with confusion on POD6 after L5-S1 PLIF.  She has no neurological deficits, but has altered mental status.  She may has lab evidence of anemia and dehydration with some prerenal acute kidney injury.  - No evidence of infection - Low likelihood of significant post-operative fluid collection - I am available for emergency management of her condition.  It does not appear that she has an emergent surgical problem at this point.  After stabilization, I will defer further management to Dr. Annette Stable.  Antion Andres K. Izora Ribas MD, [facility]  Patient unable to give full history - Level 5 qualifier

## 2021-02-16 ENCOUNTER — Observation Stay: Payer: Medicare Other

## 2021-02-16 DIAGNOSIS — F32A Depression, unspecified: Secondary | ICD-10-CM | POA: Diagnosis present

## 2021-02-16 DIAGNOSIS — E1169 Type 2 diabetes mellitus with other specified complication: Secondary | ICD-10-CM | POA: Diagnosis not present

## 2021-02-16 DIAGNOSIS — N179 Acute kidney failure, unspecified: Principal | ICD-10-CM

## 2021-02-16 DIAGNOSIS — R9431 Abnormal electrocardiogram [ECG] [EKG]: Secondary | ICD-10-CM | POA: Diagnosis present

## 2021-02-16 DIAGNOSIS — Z20822 Contact with and (suspected) exposure to covid-19: Secondary | ICD-10-CM | POA: Diagnosis present

## 2021-02-16 DIAGNOSIS — E860 Dehydration: Secondary | ICD-10-CM | POA: Diagnosis present

## 2021-02-16 DIAGNOSIS — G9341 Metabolic encephalopathy: Secondary | ICD-10-CM | POA: Diagnosis present

## 2021-02-16 DIAGNOSIS — M47812 Spondylosis without myelopathy or radiculopathy, cervical region: Secondary | ICD-10-CM | POA: Diagnosis present

## 2021-02-16 DIAGNOSIS — I130 Hypertensive heart and chronic kidney disease with heart failure and stage 1 through stage 4 chronic kidney disease, or unspecified chronic kidney disease: Secondary | ICD-10-CM | POA: Diagnosis present

## 2021-02-16 DIAGNOSIS — N189 Chronic kidney disease, unspecified: Secondary | ICD-10-CM

## 2021-02-16 DIAGNOSIS — E039 Hypothyroidism, unspecified: Secondary | ICD-10-CM

## 2021-02-16 DIAGNOSIS — I5032 Chronic diastolic (congestive) heart failure: Secondary | ICD-10-CM | POA: Diagnosis present

## 2021-02-16 DIAGNOSIS — Z6841 Body Mass Index (BMI) 40.0 and over, adult: Secondary | ICD-10-CM | POA: Diagnosis not present

## 2021-02-16 DIAGNOSIS — D62 Acute posthemorrhagic anemia: Secondary | ICD-10-CM | POA: Diagnosis present

## 2021-02-16 DIAGNOSIS — D500 Iron deficiency anemia secondary to blood loss (chronic): Secondary | ICD-10-CM

## 2021-02-16 DIAGNOSIS — M810 Age-related osteoporosis without current pathological fracture: Secondary | ICD-10-CM | POA: Diagnosis present

## 2021-02-16 DIAGNOSIS — Z96659 Presence of unspecified artificial knee joint: Secondary | ICD-10-CM | POA: Diagnosis present

## 2021-02-16 DIAGNOSIS — R4182 Altered mental status, unspecified: Secondary | ICD-10-CM | POA: Diagnosis present

## 2021-02-16 DIAGNOSIS — E1122 Type 2 diabetes mellitus with diabetic chronic kidney disease: Secondary | ICD-10-CM | POA: Diagnosis present

## 2021-02-16 DIAGNOSIS — E785 Hyperlipidemia, unspecified: Secondary | ICD-10-CM

## 2021-02-16 DIAGNOSIS — E559 Vitamin D deficiency, unspecified: Secondary | ICD-10-CM | POA: Diagnosis present

## 2021-02-16 DIAGNOSIS — D329 Benign neoplasm of meninges, unspecified: Secondary | ICD-10-CM | POA: Diagnosis present

## 2021-02-16 DIAGNOSIS — K589 Irritable bowel syndrome without diarrhea: Secondary | ICD-10-CM | POA: Diagnosis present

## 2021-02-16 DIAGNOSIS — F410 Panic disorder [episodic paroxysmal anxiety] without agoraphobia: Secondary | ICD-10-CM | POA: Diagnosis present

## 2021-02-16 DIAGNOSIS — G894 Chronic pain syndrome: Secondary | ICD-10-CM | POA: Diagnosis present

## 2021-02-16 DIAGNOSIS — N1831 Chronic kidney disease, stage 3a: Secondary | ICD-10-CM | POA: Diagnosis present

## 2021-02-16 DIAGNOSIS — Z66 Do not resuscitate: Secondary | ICD-10-CM | POA: Diagnosis present

## 2021-02-16 DIAGNOSIS — K219 Gastro-esophageal reflux disease without esophagitis: Secondary | ICD-10-CM | POA: Diagnosis present

## 2021-02-16 LAB — CBC WITH DIFFERENTIAL/PLATELET
Abs Immature Granulocytes: 0.17 10*3/uL — ABNORMAL HIGH (ref 0.00–0.07)
Basophils Absolute: 0.1 10*3/uL (ref 0.0–0.1)
Basophils Relative: 1 %
Eosinophils Absolute: 0.2 10*3/uL (ref 0.0–0.5)
Eosinophils Relative: 3 %
HCT: 24.8 % — ABNORMAL LOW (ref 36.0–46.0)
Hemoglobin: 7.8 g/dL — ABNORMAL LOW (ref 12.0–15.0)
Immature Granulocytes: 2 %
Lymphocytes Relative: 18 %
Lymphs Abs: 1.4 10*3/uL (ref 0.7–4.0)
MCH: 28.9 pg (ref 26.0–34.0)
MCHC: 31.5 g/dL (ref 30.0–36.0)
MCV: 91.9 fL (ref 80.0–100.0)
Monocytes Absolute: 0.8 10*3/uL (ref 0.1–1.0)
Monocytes Relative: 10 %
Neutro Abs: 5.2 10*3/uL (ref 1.7–7.7)
Neutrophils Relative %: 66 %
Platelets: 192 10*3/uL (ref 150–400)
RBC: 2.7 MIL/uL — ABNORMAL LOW (ref 3.87–5.11)
RDW: 15.5 % (ref 11.5–15.5)
WBC: 7.8 10*3/uL (ref 4.0–10.5)
nRBC: 0.3 % — ABNORMAL HIGH (ref 0.0–0.2)

## 2021-02-16 LAB — COMPREHENSIVE METABOLIC PANEL
ALT: 15 U/L (ref 0–44)
AST: 23 U/L (ref 15–41)
Albumin: 3.2 g/dL — ABNORMAL LOW (ref 3.5–5.0)
Alkaline Phosphatase: 60 U/L (ref 38–126)
Anion gap: 9 (ref 5–15)
BUN: 28 mg/dL — ABNORMAL HIGH (ref 8–23)
CO2: 21 mmol/L — ABNORMAL LOW (ref 22–32)
Calcium: 8.9 mg/dL (ref 8.9–10.3)
Chloride: 111 mmol/L (ref 98–111)
Creatinine, Ser: 1.19 mg/dL — ABNORMAL HIGH (ref 0.44–1.00)
GFR, Estimated: 48 mL/min — ABNORMAL LOW (ref >60)
Glucose, Bld: 131 mg/dL — ABNORMAL HIGH (ref 70–99)
Potassium: 4.8 mmol/L (ref 3.5–5.1)
Sodium: 141 mmol/L (ref 135–145)
Total Bilirubin: 1 mg/dL (ref 0.3–1.2)
Total Protein: 6.4 g/dL — ABNORMAL LOW (ref 6.5–8.1)

## 2021-02-16 LAB — PHOSPHORUS: Phosphorus: 3.2 mg/dL (ref 2.5–4.6)

## 2021-02-16 LAB — GLUCOSE, CAPILLARY
Glucose-Capillary: 122 mg/dL — ABNORMAL HIGH (ref 70–99)
Glucose-Capillary: 158 mg/dL — ABNORMAL HIGH (ref 70–99)
Glucose-Capillary: 171 mg/dL — ABNORMAL HIGH (ref 70–99)
Glucose-Capillary: 186 mg/dL — ABNORMAL HIGH (ref 70–99)

## 2021-02-16 LAB — MAGNESIUM: Magnesium: 1.6 mg/dL — ABNORMAL LOW (ref 1.7–2.4)

## 2021-02-16 MED ORDER — INSULIN GLARGINE 100 UNIT/ML ~~LOC~~ SOLN
8.0000 [IU] | Freq: Every day | SUBCUTANEOUS | Status: DC
  Administered 2021-02-16 – 2021-02-19 (×4): 8 [IU] via SUBCUTANEOUS
  Filled 2021-02-16 (×5): qty 0.08

## 2021-02-16 MED ORDER — QUETIAPINE FUMARATE 300 MG PO TABS
300.0000 mg | ORAL_TABLET | Freq: Every day | ORAL | Status: DC
  Administered 2021-02-16 – 2021-02-19 (×4): 300 mg via ORAL
  Filled 2021-02-16 (×5): qty 1

## 2021-02-16 MED ORDER — TOPIRAMATE 100 MG PO TABS
200.0000 mg | ORAL_TABLET | Freq: Every day | ORAL | Status: DC
  Administered 2021-02-16 – 2021-02-19 (×4): 200 mg via ORAL
  Filled 2021-02-16 (×5): qty 2

## 2021-02-16 MED ORDER — VILAZODONE HCL 20 MG PO TABS
40.0000 mg | ORAL_TABLET | Freq: Every day | ORAL | Status: DC
  Filled 2021-02-16 (×2): qty 2

## 2021-02-16 MED ORDER — ACETAMINOPHEN 325 MG PO TABS
650.0000 mg | ORAL_TABLET | Freq: Four times a day (QID) | ORAL | Status: DC | PRN
  Administered 2021-02-17 – 2021-02-19 (×4): 650 mg via ORAL
  Filled 2021-02-16 (×5): qty 2

## 2021-02-16 MED ORDER — MAGNESIUM SULFATE 2 GM/50ML IV SOLN
2.0000 g | Freq: Once | INTRAVENOUS | Status: AC
  Administered 2021-02-16: 2 g via INTRAVENOUS
  Filled 2021-02-16: qty 50

## 2021-02-16 MED ORDER — LACTATED RINGERS IV SOLN: INTRAVENOUS | Status: DC

## 2021-02-16 MED ORDER — OXYCODONE-ACETAMINOPHEN 5-325 MG PO TABS
1.0000 | ORAL_TABLET | Freq: Three times a day (TID) | ORAL | Status: DC | PRN
  Administered 2021-02-16 – 2021-02-20 (×8): 1 via ORAL
  Filled 2021-02-16 (×9): qty 1

## 2021-02-16 MED ORDER — HALOPERIDOL LACTATE 5 MG/ML IJ SOLN: 1.0000 mg | Freq: Four times a day (QID) | INTRAMUSCULAR | Status: DC | PRN

## 2021-02-16 NOTE — Evaluation (Addendum)
Physical Therapy Evaluation Patient Details Name: [name] MRN: [id number] DOB: 02-07-1946 Today's Date: 02/16/2021   History of Present Illness  Pt is a 75 y/o F admitted from home with c/c of AMS. Pt with revision & extension of L5-S1 PLIF with L4-S1 on 02/09/21 by Dr. Annette Stable at [place] d/c home with [facility] f/u. Pt being treated for acute metabolic encephalopathy of unclear etiology. PMH: HTN, DM2, hypothyroidism, anxiety, arthritis, CHF, CKD, chronic pain syndrome, depression, GERD, HLD, IBS, obesity, osteoporosis, panic attacks, seizures, sleep apnea  Clinical Impression  Pt seen for PT evaluation with pt AxO x 4 but with very poor awareness, perseverative on speaking with daughter, unaware of back precautions. PT educates pt on back precautions, technique & sequencing log rolling, and dons back brace total assist. Pt is able to ambulate household distance with RW & CGA before reporting need to rest 2/2 fatigue. Per case manager notes pt's daughter is unable to provide assistance at d/c. Due to pt's significantly impaired cognition & awareness, and need for physical assistance, pt is unsafe to d/c home without assistance. Pt would benefit from STR to maximize independence with functional mobility & decrease fall risk prior to return home.     Follow Up Recommendations Supervision/Assistance - 24 hour;SNF    Equipment Recommendations  Rolling walker with 5" wheels;3in1 (PT)    Recommendations for Other Services       Precautions / Restrictions Precautions Precautions: Fall;Back Required Braces or Orthoses: Spinal Brace Spinal Brace: Lumbar corset;Applied in sitting position Restrictions Weight Bearing Restrictions: No      Mobility  Bed Mobility Overal bed mobility: Needs Assistance Bed Mobility: Rolling;Sidelying to Sit Rolling: Min guard Sidelying to sit: Min assist       General bed mobility comments: cuing for log rolling technique, sequencing movement, uses bed rails     Transfers Overall transfer level: Needs assistance Equipment used: Rolling walker (2 wheeled) Transfers: Sit to/from Stand Sit to Stand: Min guard            Ambulation/Gait Ambulation/Gait assistance: Counsellor (Feet): 50 Feet Assistive device: Rolling walker (2 wheeled) Gait Pattern/deviations: Decreased stride length;Step-through pattern;Decreased dorsiflexion - right;Decreased dorsiflexion - left;Decreased step length - right;Decreased step length - left Gait velocity: decreased      Stairs            Wheelchair Mobility    Modified Rankin (Stroke Patients Only)       Balance Overall balance assessment: Needs assistance Sitting-balance support: Feet supported Sitting balance-Leahy Scale: Good Sitting balance - Comments: supervision sitting EOB   Standing balance support: Bilateral upper extremity supported;During functional activity Standing balance-Leahy Scale: Fair Standing balance comment: BUE support on RW in standing/gait                             Pertinent Vitals/Pain Pain Assessment: Faces Pain Score: 7  Pain Location: back Pain Descriptors / Indicators: Sore Pain Intervention(s): Limited activity within patient's tolerance;Monitored during session;Patient requesting pain meds-RN notified    Home Living Family/patient expects to be discharged to:: Private residence Living Arrangements:  [name]") Available Help at Discharge:  (unsure) Type of Home: House Home Access: Level entry     Home Layout: One level Home Equipment: Gilford Rile - 2 wheels (rollator)      Prior Function Level of Independence: Needs assistance   Gait / Transfers Assistance Needed: ambulates with rollator. Reports multiple falls in last 6  months  ADL's / Homemaking Assistance Needed: needs assistance from daughter for sink baths        Hand Dominance        Extremity/Trunk Assessment        Lower Extremity Assessment Lower  Extremity Assessment: Generalized weakness    Cervical / Trunk Assessment Cervical / Trunk Exceptions: back sx (with drainage noted)  Communication   Communication: No difficulties  Cognition Arousal/Alertness: Awake/alert Behavior During Therapy: WFL for tasks assessed/performed (Perseverative on speaking with daughter.) Overall Cognitive Status: No family/caregiver present to determine baseline cognitive functioning Area of Impairment: Orientation;Attention;Memory;Following commands;Safety/judgement;Awareness;Problem solving                 Orientation Level:  (AxO x 4)   Memory: Decreased recall of precautions;Decreased short-term memory Following Commands: Follows one step commands with increased time Safety/Judgement: Decreased awareness of safety;Decreased awareness of deficits Awareness: Emergent;Anticipatory Problem Solving: Slow processing;Difficulty sequencing;Requires verbal cues;Requires tactile cues General Comments: Pt unable to recall back precautions, perseverative on speaking with daughter & seeing daughter but also talks of calling the police on her daughter. Unable to provide PLOF/home set up info      General Comments      Exercises     Assessment/Plan    PT Assessment Patient needs continued PT services  PT Problem List Decreased strength;Decreased activity tolerance;Decreased balance;Decreased mobility;Decreased knowledge of precautions;Decreased safety awareness;Decreased cognition;Cardiopulmonary status limiting activity;Pain;Decreased skin integrity;Decreased knowledge of use of DME       PT Treatment Interventions DME instruction;Gait training;Functional mobility training;Therapeutic activities;Therapeutic exercise;Balance training;Patient/family education;Modalities;Cognitive remediation;Stair training;Neuromuscular re-education;Manual techniques    PT Goals (Current goals can be found in the Care Plan section)  Acute Rehab PT Goals Patient  Stated Goal: speak with daughter PT Goal Formulation: With patient Time For Goal Achievement: 03/02/21 Potential to Achieve Goals: Fair    Frequency Min 2X/week   Barriers to discharge Decreased caregiver support      Co-evaluation               AM-PAC PT "6 Clicks" Mobility  Outcome Measure Help needed turning from your back to your side while in a flat bed without using bedrails?: A Little Help needed moving from lying on your back to sitting on the side of a flat bed without using bedrails?: A Little Help needed moving to and from a bed to a chair (including a wheelchair)?: A Little Help needed standing up from a chair using your arms (e.g., wheelchair or bedside chair)?: A Little Help needed to walk in hospital room?: A Little Help needed climbing 3-5 steps with a railing? : A Lot 6 Click Score: 17    End of Session Equipment Utilized During Treatment: Gait belt;Back brace Activity Tolerance: Patient tolerated treatment well;Patient limited by fatigue Patient left: in chair;with call bell/phone within reach;with chair alarm set;with SCD's reapplied (back brace donned) Nurse Communication: Mobility status;Precautions PT Visit Diagnosis: Unsteadiness on feet (R26.81);Muscle weakness (generalized) (M62.81);Pain Pain - part of body:  (back)    Time: 4970-2637 PT Time Calculation (min) (ACUTE ONLY): 25 min   Charges:   PT Evaluation $PT Eval Low Complexity: [place], PT, DPT 02/16/21, 3:01 PM   Waunita Schooner 02/16/2021, 2:53 PM

## 2021-02-16 NOTE — Progress Notes (Addendum)
The patient had surgery on 4/8 with Dr pool She was set up with [facility], PT at home with [facility] at that DC and they will continue She lives with her daughter and [name] the caregiver (not sure if this is accurate as the patient is confused and a poor historian) Rollator and 3 in 1 at home, will continue to monitor for needs  Daughter is now stating that she will not be able to help her at home at DC The patient is very confused and making accusations about daughter stealing her car TOC will continue to monitor for needs

## 2021-02-16 NOTE — Evaluation (Signed)
Occupational Therapy Evaluation Patient Details Name: [name] MRN: [id number] DOB: 09/10/1946 Today's Date: 02/16/2021    History of Present Illness Pt is a 75 y/o F admitted from home with c/c of AMS. Pt with revision & extension of L5-S1 PLIF with L4-S1 on 02/09/21 by Dr. Annette Stable at [place] d/c home with [facility] f/u. Pt being treated for acute metabolic encephalopathy of unclear etiology. PMH: HTN, DM2, hypothyroidism, anxiety, arthritis, CHF, CKD, chronic pain syndrome, depression, GERD, HLD, IBS, obesity, osteoporosis, panic attacks, seizures, sleep apnea   Clinical Impression   Pt seen for OT evaluation this date in setting of becoming re-hospitalized after sx last week d/t now presenting with altered mental status. Pt is confused this date and unable to describe PLOF/home setup, but information recently acquired at [facility] post-op'ly by PT/OT services. Pt was amb with SPC and had some assist from her dtr for sink baths and IADLs. Pt also lives with a roommate named "[name]". Pt presents this date with decreased balance, tolerance, cognition, safety awareness and strength impacting her abiltiy to safely perform ADLs/ADL mobility. Pt requires CGA/MIN A for ADL transfers and requires MOD/MAX A for LB ADLs as she is unable to retain modification cues in setting of back precautions post-op'ly. OT engages pt in UB bathing tasks in sitting with SETUP to MIN A and cues to sequence throughout and pt requires MAX A for posterior LB in standing. Pt left seated in chair with CNA finishing bathing tasks. Left with chair alarm and all needs met. Will continue to follow acutely. Anticipate pt will require STR f/u upon d/c from hospital to improve safety with self care ADLs/ADL mobility to reasonable level to return to home environment.     Follow Up Recommendations  SNF;Supervision/Assistance - 24 hour    Equipment Recommendations  3 in 1 bedside commode;Tub/shower seat;Other (comment)     Recommendations for Other Services       Precautions / Restrictions Precautions Precautions: Fall;Back Required Braces or Orthoses: Spinal Brace Spinal Brace: Lumbar corset;Applied in sitting position Restrictions Weight Bearing Restrictions: No      Mobility Bed Mobility Overal bed mobility: Needs Assistance Bed Mobility: Sidelying to Sit Rolling: Min guard Sidelying to sit: Min assist       General bed mobility comments: increased time, cues for technique    Transfers Overall transfer level: Needs assistance Equipment used: Rolling walker (2 wheeled) Transfers: Sit to/from Stand Sit to Stand: Min guard         General transfer comment: cues for safety as well as increased time    Balance Overall balance assessment: Needs assistance Sitting-balance support: Feet supported Sitting balance-Leahy Scale: Good Sitting balance - Comments: supervision sitting EOB   Standing balance support: Bilateral upper extremity supported;During functional activity Standing balance-Leahy Scale: Fair Standing balance comment: BUE support on RW in standing/gait                           ADL either performed or assessed with clinical judgement   ADL Overall ADL's : Needs assistance/impaired Eating/Feeding: Set up;Sitting;Cueing for sequencing   Grooming: Set up;Sitting;Cueing for sequencing   Upper Body Bathing: Set up;Sitting   Lower Body Bathing: Moderate assistance;Maximal assistance Lower Body Bathing Details (indicate cue type and reason): MOD/MAX A for LB posterior in standing as pt is unsuccessful trying to contribute to task while also maintaining precautions despite cueing. Upper Body Dressing : Set up;Min guard;Sitting Upper Body Dressing  Details (indicate cue type and reason): garment oriented correctly Lower Body Dressing: Maximal assistance;Sitting/lateral leans Lower Body Dressing Details (indicate cue type and reason): limited hip ROM and pt also  limited by precautions, requries cues to adhere Toilet Transfer: Minimal assistance;Ambulation;Stand-pivot;BSC   Toileting- Clothing Manipulation and Hygiene: Maximal assistance;Sit to/from stand Toileting - Clothing Manipulation Details (indicate cue type and reason): MAX A as pt requries UE support on RW B/l'y             Vision   Vision Assessment?: No apparent visual deficits Additional Comments: difficult to assess given pt's agitation/confsion during session     Perception     Praxis      Pertinent Vitals/Pain Pain Assessment: Faces Pain Score: 7  Faces Pain Scale: Hurts little more Pain Location: back Pain Descriptors / Indicators: Sore Pain Intervention(s): Limited activity within patient's tolerance;Monitored during session     Hand Dominance Right   Extremity/Trunk Assessment Upper Extremity Assessment Upper Extremity Assessment: Generalized weakness   Lower Extremity Assessment Lower Extremity Assessment: Generalized weakness   Cervical / Trunk Assessment Cervical / Trunk Assessment: Other exceptions Cervical / Trunk Exceptions: back sx (with drainage noted), reported to RN   Communication Communication Communication: No difficulties   Cognition Arousal/Alertness: Awake/alert Behavior During Therapy: WFL for tasks assessed/performed;Anxious;Agitated (initially [facility] ,but gradually becomes mroe agitated, espcially on the topic of her daughter. Mentions that she's fearful her dtr will steal her car and drunk drive) Overall Cognitive Status: No family/caregiver present to determine baseline cognitive functioning Area of Impairment: Orientation;Attention;Memory;Following commands;Safety/judgement;Awareness;Problem solving                 Orientation Level: Disoriented to;Situation Current Attention Level: Focused Memory: Decreased recall of precautions;Decreased short-term memory Following Commands: Follows one step commands with increased  time Safety/Judgement: Decreased awareness of safety;Decreased awareness of deficits Awareness: Emergent;Anticipatory Problem Solving: Slow processing;Difficulty sequencing;Requires verbal cues;Requires tactile cues General Comments: Pt unable to recall back precautions, perseverative on speaking with daughter & seeing daughter but also talks of calling the police on her daughter. Unable to provide PLOF/home set up info   General Comments       Exercises     Shoulder Instructions      Home Living Family/patient expects to be discharged to:: Private residence Living Arrangements: Other (Comment) (Roommate, [name]) Available Help at Discharge:  (unsure, Dtr is listed in contacts, but appears to be some family dynamics based on conversing with pt, albeit confused) Type of Home: House Home Access: Level entry     Home Layout: One level     Bathroom Shower/Tub: Occupational psychologist: Standard Bathroom Accessibility: Yes   Home Equipment: Environmental consultant - 2 wheels;Walker - 4 wheels          Prior Functioning/Environment Level of Independence: Needs assistance  Gait / Transfers Assistance Needed: ambulates with rollator. Reports multiple falls in last 6 months ADL's / Homemaking Assistance Needed: needs assistance from daughter for sink baths   Comments: reports she primarily uses [facility] for household ambulation, however uses rollater if needed. Reports 2 falls in past 6 months.        OT Problem List: Decreased strength;Decreased activity tolerance;Impaired balance (sitting and/or standing);Decreased coordination;Decreased cognition;Decreased safety awareness;Decreased knowledge of use of DME or AE;Impaired UE functional use;Pain;Increased edema      OT Treatment/Interventions: Self-care/ADL training;Therapeutic exercise;Energy conservation;DME and/or AE instruction;Therapeutic activities;Cognitive remediation/compensation;Patient/family education;Balance training    OT  Goals(Current goals can be found in the care plan section) Acute  Rehab OT Goals Patient Stated Goal: speak with daughter OT Goal Formulation: With patient/family Time For Goal Achievement: 03/02/21 Potential to Achieve Goals: Good ADL Goals Pt Will Perform Grooming: with supervision;standing (sink-side with AD PRN) Pt Will Perform Lower Body Dressing: with supervision;sit to/from stand;with adaptive equipment Pt Will Transfer to Toilet: with supervision;ambulating;grab bars (with LRAD) Pt Will Perform Toileting - Clothing Manipulation and hygiene: with supervision;sit to/from stand (adhering to no twisting precuations/modifying as needed) Pt/caregiver will Perform Home Exercise Program:  (mindful of back precautions) Additional ADL Goal #1: Pt will demo safe understanding of donning LSO with no assist  OT Frequency: Min 2X/week   Barriers to D/C:            Co-evaluation              AM-PAC OT "6 Clicks" Daily Activity     Outcome Measure Help from another person eating meals?: None Help from another person taking care of personal grooming?: A Little Help from another person toileting, which includes using toliet, bedpan, or urinal?: A Lot Help from another person bathing (including washing, rinsing, drying)?: A Lot Help from another person to put on and taking off regular upper body clothing?: A Little Help from another person to put on and taking off regular lower body clothing?: A Lot 6 Click Score: 16   End of Session Equipment Utilized During Treatment: Rolling walker;Back brace Nurse Communication: Mobility status  Activity Tolerance: Patient limited by pain Patient left: in chair;with call bell/phone within reach;with chair alarm set  OT Visit Diagnosis: Unsteadiness on feet (R26.81);Muscle weakness (generalized) (M62.81);Pain;Dizziness and giddiness (R42) Pain - part of body:  (back)                Time: 2091-9802 OT Time Calculation (min): 33 min Charges:  OT  General Charges $OT Visit: 1 Visit OT Evaluation $OT Eval Moderate Complexity: 1 Mod OT Treatments $Self Care/Home Management : 8-22 mins $Therapeutic Activity: 8-22 mins  Gerrianne Scale, MS, OTR/L ascom [phone] 02/16/21, 5:39 PM

## 2021-02-16 NOTE — Progress Notes (Signed)
Patient ID: [name], female   DOB: 07-09-1946, 75 y.o.   MRN: [id number] [facility] PROGRESS NOTE  [name]:[id number] DOB: [birth year] DOA: 02/15/2021 PCP: [place]  HPI/Subjective: Patient brought into the hospital with altered mental status.  States that she did not know why she is here.  Recently had a back surgery.  As per nursing staff draining some blood from the area.  Patient stated that she thinks her daughter stole her car.  Patient admitted with dehydration and acute kidney injury.  Patient having some trouble finding words.  Objective: Vitals:   02/16/21 0445 02/16/21 0728  BP: (!) 155/67 (!) 149/67  Pulse: 94 81  Resp: 18 16  Temp: 99.1 F (37.3 C) 98.8 F (37.1 C)  SpO2: 98% 96%    Intake/Output Summary (Last 24 hours) at 02/16/2021 1409 Last data filed at 02/16/2021 0444 Gross per 24 hour  Intake 12.57 ml  Output 800 ml  Net -787.43 ml   Filed Weights   02/15/21 0933  Weight: 113.4 kg    ROS: Review of Systems  Respiratory: Negative for shortness of breath.   Cardiovascular: Negative for chest pain.  Gastrointestinal: Negative for abdominal pain, nausea and vomiting.  Musculoskeletal: Positive for back pain.   Exam: Physical Exam HENT:     Head: Normocephalic.     Mouth/Throat:     Pharynx: No oropharyngeal exudate.  Eyes:     General: Lids are normal.     Conjunctiva/sclera: Conjunctivae normal.     Pupils: Pupils are equal, round, and reactive to light.  Cardiovascular:     Rate and Rhythm: Normal rate and regular rhythm.     Heart sounds: Normal heart sounds, S1 normal and S2 normal.  Pulmonary:     Breath sounds: No decreased breath sounds, wheezing, rhonchi or rales.  Abdominal:     Palpations: Abdomen is soft.     Tenderness: There is no abdominal tenderness.  Musculoskeletal:     Right lower leg: Swelling present.     Left lower leg: Swelling present.  Skin:    General: Skin is warm.     Findings: No  rash.  Neurological:     Mental Status: She is alert and oriented to person, place, and time.       Data Reviewed: Basic Metabolic Panel: Recent Labs  Lab 02/15/21 0936 02/15/21 1914 02/16/21 0544  NA 141  --  141  K 5.1  --  4.8  CL 111  --  111  CO2 20*  --  21*  GLUCOSE 141*  --  131*  BUN 46*  --  28*  CREATININE 1.82* 1.32* 1.19*  CALCIUM 9.0  --  8.9  MG  --   --  1.6*  PHOS  --   --  3.2   Liver Function Tests: Recent Labs  Lab 02/15/21 0936 02/16/21 0544  AST 23 23  ALT 17 15  ALKPHOS 73 60  BILITOT 0.8 1.0  PROT 7.1 6.4*  ALBUMIN 3.4* 3.2*   CBC: Recent Labs  Lab 02/15/21 0936 02/15/21 1914 02/16/21 0544  WBC 8.0 8.0 7.8  NEUTROABS  --   --  5.2  HGB 8.3* 8.4* 7.8*  HCT 26.0* 26.8* 24.8*  MCV 93.2 93.1 91.9  PLT 196 214 192   BNP (last 3 results) Recent Labs    03/06/20 0725  BNP 36.0    CBG: Recent Labs  Lab 02/11/21 1939 02/12/21 3790 02/12/21 1209 02/16/21 0008  02/16/21 0730  GLUCAP 101* 151* 155* 158* 122*    Recent Results (from the past 240 hour(s))  SARS CORONAVIRUS 2 (TAT 6-24 HRS) Nasopharyngeal Nasopharyngeal Swab     Status: None   Collection Time: 02/07/21 10:11 AM   Specimen: Nasopharyngeal Swab  Result Value Ref Range Status   SARS Coronavirus 2 NEGATIVE NEGATIVE Final    Comment: (NOTE) SARS-CoV-2 target nucleic acids are NOT DETECTED.  The SARS-CoV-2 RNA is generally detectable in upper and lower respiratory specimens during the acute phase of infection. Negative results do not preclude SARS-CoV-2 infection, do not rule out co-infections with other pathogens, and should not be used as the sole basis for treatment or other patient management decisions. Negative results must be combined with clinical observations, patient history, and epidemiological information. The expected result is Negative.  Fact Sheet for Patients: SugarRoll.be  Fact Sheet for Healthcare  Providers: https://www.woods-mathews.com/  This test is not yet approved or cleared by the [place] FDA and  has been authorized for detection and/or diagnosis of SARS-CoV-2 by FDA under an Emergency Use Authorization (EUA). This EUA will remain  in effect (meaning this test can be used) for the duration of the COVID-19 declaration under Se ction 564(b)(1) of the Act, 21 U.S.C. section 360bbb-3(b)(1), unless the authorization is terminated or revoked sooner.  Performed at [facility], [place] [address], [place], [place] [address]   Surgical pcr screen     Status: None   Collection Time: 02/09/21  6:56 AM   Specimen: Nasal Mucosa; Nasal Swab  Result Value Ref Range Status   MRSA, PCR NEGATIVE NEGATIVE Final   Staphylococcus aureus NEGATIVE NEGATIVE Final    Comment: (NOTE) The Xpert SA Assay (FDA approved for NASAL specimens in patients 6 years of age and older), is one component of a comprehensive surveillance program. It is not intended to diagnose infection nor to guide or monitor treatment. Performed at [facility], [place] [address] [place], [place] [address]   Resp Panel by RT-PCR (Flu A&B, Covid) Nasopharyngeal Swab     Status: None   Collection Time: 02/15/21  4:56 PM   Specimen: Nasopharyngeal Swab; Nasopharyngeal(NP) swabs in vial transport medium  Result Value Ref Range Status   SARS Coronavirus 2 by RT PCR NEGATIVE NEGATIVE Final    Comment: (NOTE) SARS-CoV-2 target nucleic acids are NOT DETECTED.  The SARS-CoV-2 RNA is generally detectable in upper respiratory specimens during the acute phase of infection. The lowest concentration of SARS-CoV-2 viral copies this assay can detect is 138 copies/mL. A negative result does not preclude SARS-Cov-2 infection and should not be used as the sole basis for treatment or other patient management decisions. A negative result may occur with  improper specimen collection/handling, submission of  specimen other than nasopharyngeal swab, presence of viral mutation(s) within the areas targeted by this assay, and inadequate number of viral copies(<138 copies/mL). A negative result must be combined with clinical observations, patient history, and epidemiological information. The expected result is Negative.  Fact Sheet for Patients:  EntrepreneurPulse.com.au  Fact Sheet for Healthcare Providers:  IncredibleEmployment.be  This test is no t yet approved or cleared by the [place] FDA and  has been authorized for detection and/or diagnosis of SARS-CoV-2 by FDA under an Emergency Use Authorization (EUA). This EUA will remain  in effect (meaning this test can be used) for the duration of the COVID-19 declaration under Section 564(b)(1) of the Act, 21 U.S.C.section 360bbb-3(b)(1), unless the authorization is  terminated  or revoked sooner.       Influenza A by PCR NEGATIVE NEGATIVE Final   Influenza B by PCR NEGATIVE NEGATIVE Final    Comment: (NOTE) The Xpert Xpress SARS-CoV-2/FLU/RSV plus assay is intended as an aid in the diagnosis of influenza from Nasopharyngeal swab specimens and should not be used as a sole basis for treatment. Nasal washings and aspirates are unacceptable for Xpert Xpress SARS-CoV-2/FLU/RSV testing.  Fact Sheet for Patients: EntrepreneurPulse.com.au  Fact Sheet for Healthcare Providers: IncredibleEmployment.be  This test is not yet approved or cleared by the [place] FDA and has been authorized for detection and/or diagnosis of SARS-CoV-2 by FDA under an Emergency Use Authorization (EUA). This EUA will remain in effect (meaning this test can be used) for the duration of the COVID-19 declaration under Section 564(b)(1) of the Act, 21 U.S.C. section 360bbb-3(b)(1), unless the authorization is terminated or revoked.  Performed at [facility], [address] [place], [place] [address]   CULTURE, BLOOD (ROUTINE X 2) w Reflex to ID Panel     Status: None (Preliminary result)   Collection Time: 02/15/21  7:13 PM   Specimen: BLOOD  Result Value Ref Range Status   Specimen Description BLOOD BLOOD LEFT FOREARM  Final   Special Requests   Final    BOTTLES DRAWN AEROBIC ONLY Blood Culture results may not be optimal due to an inadequate volume of blood received in culture bottles   Culture   Final    NO GROWTH < 12 HOURS Performed at [facility], [address] [place], [place] [address]    Report Status PENDING  Incomplete  CULTURE, BLOOD (ROUTINE X 2) w Reflex to ID Panel     Status: None (Preliminary result)   Collection Time: 02/15/21  7:14 PM   Specimen: BLOOD  Result Value Ref Range Status   Specimen Description BLOOD BLOOD RIGHT HAND  Final   Special Requests   Final    BOTTLES DRAWN AEROBIC AND ANAEROBIC Blood Culture adequate volume   Culture   Final    NO GROWTH < 12 HOURS Performed at [facility], [address], [place], [place] [address]    Report Status PENDING  Incomplete     Studies: CT Head Wo Contrast  Result Date: 02/15/2021 CLINICAL DATA:  Altered mental status. EXAM: CT HEAD WITHOUT CONTRAST TECHNIQUE: Contiguous axial images were obtained from the base of the skull through the vertex without intravenous contrast. COMPARISON:  June 12, 2020 FINDINGS: Brain: No evidence of acute infarction, hemorrhage, hydrocephalus, extra-axial collection or mass lesion/mass effect. Vascular: No hyperdense vessel or unexpected calcification. Skull: Normal. Negative for fracture or focal lesion. Sinuses/Orbits: No acute finding. Other: None. IMPRESSION: No acute intracranial abnormality. Electronically Signed   By: Fidela Salisbury M.D.   On: 02/15/2021 10:46   MR BRAIN WO CONTRAST  Result Date: 02/16/2021 CLINICAL DATA:  Neuro deficit, acute stroke suspected. EXAM: MRI HEAD WITHOUT CONTRAST TECHNIQUE: Multiplanar,  multiecho pulse sequences of the brain and surrounding structures were obtained without intravenous contrast. COMPARISON:  CT head February 15, 2021. MRI October 24, 2011. CT cervical spine June 12, 2020. FINDINGS: Brain: No acute infarction, hemorrhage, hydrocephalus, extra-axial collection or mass lesion. Mild for age T2/FLAIR hyperintensities within the white matter, most likely related to chronic microvascular ischemic disease. Mild for age generalized atrophy with ex vacuo ventricular dilation. Vascular: Major arterial flow voids are maintained at the skull base. Skull and upper cervical spine: Partially imaged 7  mm lesion within the left eccentric canal at the craniocervical junction, best seen on diffusion imaging (see series 5, image 1 and series 7, image 14. Normal marrow signal. Sinuses/Orbits: Mild ethmoid air cell mucosal thickening without air-fluid levels. Unremarkable orbits. Other: Moderate bilateral mastoid effusions. IMPRESSION: 1. No evidence of acute intracranial abnormality. 2. Mild atrophy and chronic microvascular ischemic disease. 3. Partially imaged 7 mm lesion within the left eccentric canal at the craniocervical junction, best seen on diffusion imaging. In retrospect, there was a calcified mass in this area on prior CT cervical spine exams. This may represent a meningioma, but is incompletely evaluated on this study. Recommend non urgent MRI of the cervical spine with contrast to further characterize. 4. Moderate bilateral mastoid effusions. Electronically Signed   By: Margaretha Sheffield MD   On: 02/16/2021 12:45   DG Chest Portable 1 View  Result Date: 02/15/2021 CLINICAL DATA:  Altered mental status, recent postoperative lumbar surgery EXAM: PORTABLE CHEST 1 VIEW COMPARISON:  06/15/2020 FINDINGS: Redemonstrated cardiomegaly. Unchanged elevation of the right hemidiaphragm. The visualized skeletal structures are unremarkable. IMPRESSION: 1. Redemonstrated cardiomegaly. No acute  abnormality of the lungs. 2. Unchanged elevation of the right hemidiaphragm. Electronically Signed   By: Eddie Candle M.D.   On: 02/15/2021 16:17    Scheduled Meds: . atorvastatin  40 mg Oral Daily  . DULoxetine  60 mg Oral q AM  . insulin aspart  0-5 Units Subcutaneous QHS  . insulin aspart  0-9 Units Subcutaneous TID WC  . levothyroxine  75 mcg Oral QAC breakfast  . metoprolol succinate  25 mg Oral Daily  . montelukast  10 mg Oral QPM  . pantoprazole  40 mg Oral Daily  . QUEtiapine  300 mg Oral QHS  . topiramate  200 mg Oral QHS  . Vilazodone HCl  40 mg Oral Daily   Continuous Infusions: . lactated ringers 50 mL/hr at 02/16/21 0400   Brief history.  Patient admitted 02/15/2021 with acute metabolic encephalopathy.  She was found to be dehydrated and started on IV fluids.  Patient had a recent lumbar surgery by Dr. Trenton Gammon over at [facility].  Seen by Dr. Julien Nordmann neurosurgery here and does not think this is an infection of the lumbar spine.  Patient had word finding issues today.  Past medical history of seizure, hypertension, hyperlipidemia, type 2 diabetes mellitus, GERD, depression, chronic pain, chronic diastolic congestive heart failure. Assessment/Plan:   1. Acute metabolic encephalopathy with word finding problems today.  MRI of the brain negative for acute stroke.  Likely acute metabolic encephalopathy secondary to dehydration and acute kidney injury.  Patient does have some paranoid thoughts thinking that her daughter stole her car.  We will give IV Haldol as needed.  Restart her nighttime medication so she sleeps tonight. 2. Acute kidney injury on chronic kidney disease stage IIIa.  Creatinine 1.82 on presentation and down to 1.19.  Gentle IV fluid hydration. 3. Recent back surgery.  Physical therapy evaluation but awaiting back brace.  Nursing staff states that this man been bleeding from the wound site.  Hold aspirin and heparin subcu. 4. Anemia, acute blood loss.  Hemoglobin  dropped down to 7.8.  On prior to discharge was 10.3.  Decrease rate of IV fluids.  Discontinue aspirin and heparin subcu.  Send off iron studies. 5. Depression continue psychiatric medications duloxetine, Viibryd, Seroquel and Topamax at night 6. Hypothyroidism unspecified on levothyroxine 7. Type 2 diabetes mellitus with hyperlipidemia on atorvastatin.  Restart Lantus low dose  8 units at night.  As per family does take 72 units at night.  Last hemoglobin A1c 6.6. 8. 7 mm lesion left eccentric canal at the craniocervical junction.  Radiology recommended contrasted MRI of the cervical spine to further characterize.  With acute kidney injury I will hold off on ordering that at this time.        Code Status:     Code Status Orders  (From admission, onward)         Start     Ordered   02/15/21 1736  Do not attempt resuscitation (DNR)  Continuous       Question Answer Comment  In the event of cardiac or respiratory ARREST Do not call a "code blue"   In the event of cardiac or respiratory ARREST Do not perform Intubation, CPR, defibrillation or ACLS   In the event of cardiac or respiratory ARREST Use medication by any route, position, wound care, and other measures to relive pain and suffering. May use oxygen, suction and manual treatment of airway obstruction as needed for comfort.      02/15/21 1736        Code Status History    Date Active Date Inactive Code Status Order ID Comments User Context   02/15/2021 1559 02/15/2021 1736 Full Code [id number]  Kayleen Memos, DO ED   02/09/2021 1126 02/12/2021 2134 Full Code [id number]  Earnie Larsson, MD Inpatient   06/12/2020 1640 06/16/2020 1954 DNR [id number]  Collier Bullock, MD ED   06/12/2020 1543 06/12/2020 1640 Full Code [id number]  Collier Bullock, MD ED   03/06/2020 1304 03/08/2020 2002 DNR [id number]  Collier Bullock, MD ED   02/06/2020 0118 02/07/2020 2045 DNR [id number]  Athena Masse, MD ED   05/05/2018 1402 05/06/2018 1414 Full Code [id number]   Isaias Cowman, MD Inpatient   Advance Care Planning Activity     Family Communication: Spoke with daughter and her roommate. Disposition Plan: Status is: Observation  Dispo:  Patient From: Home  Planned Disposition: [place]  Medically stable for discharge: No   Time spent: 28 minutes  [place]

## 2021-02-16 NOTE — Progress Notes (Signed)
The patients surgical incision on the lower lumbar was bleeding.  Patients bandage was changed.

## 2021-02-17 DIAGNOSIS — N189 Chronic kidney disease, unspecified: Secondary | ICD-10-CM | POA: Diagnosis not present

## 2021-02-17 DIAGNOSIS — N179 Acute kidney failure, unspecified: Secondary | ICD-10-CM | POA: Diagnosis not present

## 2021-02-17 DIAGNOSIS — G9341 Metabolic encephalopathy: Secondary | ICD-10-CM | POA: Diagnosis not present

## 2021-02-17 DIAGNOSIS — D500 Iron deficiency anemia secondary to blood loss (chronic): Secondary | ICD-10-CM | POA: Diagnosis not present

## 2021-02-17 LAB — CBC
HCT: 22.3 % — ABNORMAL LOW (ref 36.0–46.0)
Hemoglobin: 7.4 g/dL — ABNORMAL LOW (ref 12.0–15.0)
MCH: 30 pg (ref 26.0–34.0)
MCHC: 33.2 g/dL (ref 30.0–36.0)
MCV: 90.3 fL (ref 80.0–100.0)
Platelets: 186 10*3/uL (ref 150–400)
RBC: 2.47 MIL/uL — ABNORMAL LOW (ref 3.87–5.11)
RDW: 15.4 % (ref 11.5–15.5)
WBC: 7.9 10*3/uL (ref 4.0–10.5)
nRBC: 0.3 % — ABNORMAL HIGH (ref 0.0–0.2)

## 2021-02-17 LAB — GLUCOSE, CAPILLARY
Glucose-Capillary: 137 mg/dL — ABNORMAL HIGH (ref 70–99)
Glucose-Capillary: 200 mg/dL — ABNORMAL HIGH (ref 70–99)
Glucose-Capillary: 152 mg/dL — ABNORMAL HIGH (ref 70–99)
Glucose-Capillary: 187 mg/dL — ABNORMAL HIGH (ref 70–99)

## 2021-02-17 LAB — LACTATE DEHYDROGENASE: LDH: 162 U/L (ref 98–192)

## 2021-02-17 LAB — RETIC PANEL
Immature Retic Fract: 33.7 % — ABNORMAL HIGH (ref 2.3–15.9)
RBC.: 2.43 MIL/uL — ABNORMAL LOW (ref 3.87–5.11)
Retic Count, Absolute: 94.3 10*3/uL (ref 19.0–186.0)
Retic Ct Pct: 3.9 % — ABNORMAL HIGH (ref 0.4–3.1)
Reticulocyte Hemoglobin: 28.5 pg (ref >27.9)

## 2021-02-17 LAB — BASIC METABOLIC PANEL
Anion gap: 6 (ref 5–15)
BUN: 19 mg/dL (ref 8–23)
CO2: 22 mmol/L (ref 22–32)
Calcium: 8.5 mg/dL — ABNORMAL LOW (ref 8.9–10.3)
Chloride: 111 mmol/L (ref 98–111)
Creatinine, Ser: 1.04 mg/dL — ABNORMAL HIGH (ref 0.44–1.00)
GFR, Estimated: 57 mL/min — ABNORMAL LOW (ref >60)
Glucose, Bld: 131 mg/dL — ABNORMAL HIGH (ref 70–99)
Potassium: 3.9 mmol/L (ref 3.5–5.1)
Sodium: 139 mmol/L (ref 135–145)

## 2021-02-17 LAB — FOLATE: Folate: 15 ng/mL (ref >5.9)

## 2021-02-17 LAB — VITAMIN B12: Vitamin B-12: 230 pg/mL (ref 180–914)

## 2021-02-17 LAB — VITAMIN D 25 HYDROXY (VIT D DEFICIENCY, FRACTURES): Vit D, 25-Hydroxy: 27.35 ng/mL — ABNORMAL LOW (ref 30–100)

## 2021-02-17 LAB — FERRITIN: Ferritin: 38 ng/mL (ref 11–307)

## 2021-02-17 LAB — IRON AND TIBC
Iron: 37 ug/dL (ref 28–170)
Saturation Ratios: 13 % (ref 10.4–31.8)
TIBC: 280 ug/dL (ref 250–450)
UIBC: 243 ug/dL

## 2021-02-17 LAB — MAGNESIUM: Magnesium: 1.6 mg/dL — ABNORMAL LOW (ref 1.7–2.4)

## 2021-02-17 MED ORDER — MAGNESIUM SULFATE 2 GM/50ML IV SOLN
2.0000 g | Freq: Once | INTRAVENOUS | Status: AC
  Administered 2021-02-17: 2 g via INTRAVENOUS
  Filled 2021-02-17: qty 50

## 2021-02-17 MED ORDER — NYSTATIN 100000 UNIT/GM EX POWD
Freq: Two times a day (BID) | CUTANEOUS | Status: DC
  Filled 2021-02-17: qty 15

## 2021-02-17 MED ORDER — VILAZODONE HCL 40 MG PO TABS
40.0000 mg | ORAL_TABLET | Freq: Every day | ORAL | Status: DC
  Administered 2021-02-17 – 2021-02-20 (×4): 40 mg via ORAL
  Filled 2021-02-17 (×10): qty 1

## 2021-02-17 NOTE — TOC Progression Note (Addendum)
Transition of Care [facility]) - Progression Note    Patient Details  Name: [name] MRN: [id number] Date of Birth: Mar 12, 1946  [facility] CM/SW Contact  Zigmund Daniel Dorian Pod, RN Phone Number:[phone] 02/17/2021, 4:48 PM  Clinical Narrative:    PT/OT recommended SNF. RN spoke with pt today alert and orientated during the conversation concerning recommendations for SNF. Pt initial declined and indicated she wants her car any where she goes. Discussed [facility] as another options but most importance explained to pt in order to receive [facility] she must be home bound and any facility may not allow her to come and go as she pleases based upon the services that are offered if they felt she was unsafe. Pt indicates she understands and may consider [facility] in [place] (serviced in the past) for SNF placement. States she has a supportive significant other [name]) to discuss placement along with her daughter [name]). Will follow up on start this process with a PASRR and FL2 and hold on the bed-search until pt has this conversation with her family. Will follow up on Sunday with pt's decision for facility search.  TOC will remain involved for SNF placement.  Addendum: PASRR pending 02/17/2021 5:13 PM   Expected Discharge Plan: [place] Barriers to Discharge: Continued Medical Work up  Expected Discharge Plan and Services Expected Discharge Plan: [place]   Discharge Planning Services: CM Consult   Living arrangements for the past 2 months: [place] Agency: [place]         Social Determinants of Health (SDOH) Interventions    Readmission Risk Interventions Readmission Risk Prevention Plan 03/07/2020  Transportation Screening Complete  PCP or Specialist Appt within 3-5 Days Complete  HRI or [place] Complete  Social Work Consult for [place]  Planning/Counseling Complete  Palliative Care Screening Not Applicable  Medication Review Press photographer) Complete  Some recent data might be hidden

## 2021-02-17 NOTE — Progress Notes (Signed)
[facility] Progress Note  Patient: [name]    KXF:[id number]  DOA: 02/15/2021     Date of Service: the patient was seen and examined on 02/17/2021  Chief Complaint  Patient presents with  . Altered Mental Status   Brief hospital course:  Brief history.  Patient admitted 02/15/2021 with acute metabolic encephalopathy.  She was found to be dehydrated and started on IV fluids.  Patient had a recent lumbar surgery by Dr. Trenton Gammon over at [facility].  Seen by Dr. Julien Nordmann neurosurgery here and does not think this is an infection of the lumbar spine.  Patient had word finding issues today.  Past medical history of seizure, hypertension, hyperlipidemia, type 2 diabetes mellitus, GERD, depression, chronic pain, chronic diastolic congestive heart failure. Assessment and Plan:  # Acute metabolic encephalopathy with word finding problems.  MRI of the brain negative for acute stroke.  Likely acute metabolic encephalopathy secondary to dehydration and acute kidney injury.  Patient does have some paranoid thoughts thinking that her daughter stole her car.  Continue IV Haldol as needed.  Resumed home meds Continue supportive care Acute encephalopathy seems to be resolving, patient is AOx3 today.   # Acute kidney injury on chronic kidney disease stage IIIa.  Creatinine 1.82 on presentation and down to 1.19.  Gentle IV fluid hydration. Creatinine improving  # Recent back surgery.  Physical therapy evaluation but awaiting back brace.  Nursing staff states that this man been bleeding from the wound site.  Hold aspirin and heparin subcu. Continue SCDs  # Anemia, acute blood loss.  Hemoglobin dropped down to 7.8.  On prior to discharge was 10.3.  Decrease rate of IV fluids.  Discontinue aspirin and heparin subcu.   Iron level 37, saturation 13%, slightly lower and, folate 15.0 Vitamin B12 pending   # Depression continue psychiatric medications duloxetine, Viibryd, Seroquel and Topamax at  night Viibryd is nonformulary, patient may bring from her home  # Hypothyroidism unspecified on levothyroxine  # Type 2 diabetes mellitus with hyperlipidemia on atorvastatin.  Restart Lantus low dose 8 units at night.  As per family does take 72 units at night.  Last hemoglobin A1c 6.6.  # 7 mm lesion left eccentric canal at the craniocervical junction.  Radiology recommended contrasted MRI of the cervical spine to further characterize.  With acute kidney injury I will hold off on ordering that at this time. MRI cervical spine will be done tomorrow or Monday after improvement of creatinine  Body mass index is 44.29 kg/m.   Interventions:       Diet: Soft diabetic diet DVT Prophylaxis: SCD, pharmacological prophylaxis contraindicated due to Bleeding from back due to recent surgery   Advance goals of care discussion: DNR  Family Communication: family was NOT present at bedside, at the time of interview.  The pt provided permission to discuss medical plan with the family. Opportunity was given to ask question and all questions were answered satisfactorily.   Disposition:  Pt is from Home, admitted with AKI, dehydration and bleeding from recent back surgery, still has anemia, AKI, which precludes a safe discharge. Discharge to SNF in 1 to 2 days when she will be medically stable.  Subjective: No significant overnight issues, patient is still having slight bleeding from the back.  Patient is AOx3 now, confusion has been resolved. Pain is under control, patient denies any chest pain or palpitations, no shortness of breath.  Physical Exam: General:  alert oriented to time, place, and person.  Appear in mild distress, affect appropriate Eyes: PERRLA ENT: Oral Mucosa Clear, moist  Neck: no JVD,  Cardiovascular: S1 and S2 Present, no Murmur,  Respiratory: good respiratory effort, Bilateral Air entry equal and Decreased, no Crackles, no wheezes Abdomen: Bowel Sound present, Soft and no  tenderness,  Skin: no rashes Extremities: no Pedal edema, no calf tenderness Neurologic: without any new focal findings Gait not checked due to patient safety concerns  Vitals:   02/16/21 1552 02/16/21 2046 02/17/21 0537 02/17/21 0844  BP: (!) 155/76 (!) 138/53 (!) 121/56 137/66  Pulse: 88 72 78 69  Resp: 17 18 19 16   Temp: 98.9 F (37.2 C) 98.5 F (36.9 C) 99 F (37.2 C) 99 F (37.2 C)  TempSrc:      SpO2: 99% 99% 94% 96%  Weight:      Height:        Intake/Output Summary (Last 24 hours) at 02/17/2021 1400 Last data filed at 02/17/2021 0530 Gross per 24 hour  Intake 319.15 ml  Output 200 ml  Net 119.15 ml   Filed Weights   02/15/21 0933  Weight: 113.4 kg    Data Reviewed: I have personally reviewed and interpreted daily labs, tele strips, imagings as discussed above. I reviewed all nursing notes, pharmacy notes, vitals, pertinent old records I have discussed plan of care as described above with RN and patient/family.  CBC: Recent Labs  Lab 02/15/21 0936 02/15/21 1914 02/16/21 0544 02/17/21 0523  WBC 8.0 8.0 7.8 7.9  NEUTROABS  --   --  5.2  --   HGB 8.3* 8.4* 7.8* 7.4*  HCT 26.0* 26.8* 24.8* 22.3*  MCV 93.2 93.1 91.9 90.3  PLT 196 214 192 335   Basic Metabolic Panel: Recent Labs  Lab 02/15/21 0936 02/15/21 1914 02/16/21 0544 02/17/21 0523  NA 141  --  141 139  K 5.1  --  4.8 3.9  CL 111  --  111 111  CO2 20*  --  21* 22  GLUCOSE 141*  --  131* 131*  BUN 46*  --  28* 19  CREATININE 1.82* 1.32* 1.19* 1.04*  CALCIUM 9.0  --  8.9 8.5*  MG  --   --  1.6* 1.6*  PHOS  --   --  3.2  --     Studies: No results found.  Scheduled Meds: . atorvastatin  40 mg Oral Daily  . DULoxetine  60 mg Oral q AM  . insulin aspart  0-5 Units Subcutaneous QHS  . insulin aspart  0-9 Units Subcutaneous TID WC  . insulin glargine  8 Units Subcutaneous QHS  . levothyroxine  75 mcg Oral QAC breakfast  . metoprolol succinate  25 mg Oral Daily  . montelukast  10 mg Oral  QPM  . pantoprazole  40 mg Oral Daily  . QUEtiapine  300 mg Oral QHS  . topiramate  200 mg Oral QHS  . Vilazodone HCl  40 mg Oral Daily   Continuous Infusions: . lactated ringers 30 mL/hr at 02/17/21 0311   PRN Meds: acetaminophen, haloperidol lactate, oxyCODONE-acetaminophen  Time spent: 35 minutes  Author: Val Riles. MD [facility] 02/17/2021 2:00 PM  To reach On-call, see care teams to locate the attending and reach out to them via [email or web address]. If 7PM-7AM, please contact night-coverage If you still have difficulty reaching the attending provider, please page the [facility] (Director on Call) for [facility] on amion for assistance.

## 2021-02-17 NOTE — Plan of Care (Signed)

## 2021-02-18 ENCOUNTER — Inpatient Hospital Stay: Payer: Medicare Other

## 2021-02-18 DIAGNOSIS — D500 Iron deficiency anemia secondary to blood loss (chronic): Secondary | ICD-10-CM | POA: Diagnosis not present

## 2021-02-18 DIAGNOSIS — G9341 Metabolic encephalopathy: Secondary | ICD-10-CM | POA: Diagnosis not present

## 2021-02-18 LAB — BASIC METABOLIC PANEL
Anion gap: 10 (ref 5–15)
BUN: 16 mg/dL (ref 8–23)
CO2: 21 mmol/L — ABNORMAL LOW (ref 22–32)
Calcium: 8.6 mg/dL — ABNORMAL LOW (ref 8.9–10.3)
Chloride: 109 mmol/L (ref 98–111)
Creatinine, Ser: 0.96 mg/dL (ref 0.44–1.00)
GFR, Estimated: >60 mL/min (ref >60)
Glucose, Bld: 142 mg/dL — ABNORMAL HIGH (ref 70–99)
Potassium: 3.6 mmol/L (ref 3.5–5.1)
Sodium: 140 mmol/L (ref 135–145)

## 2021-02-18 LAB — GLUCOSE, CAPILLARY
Glucose-Capillary: 138 mg/dL — ABNORMAL HIGH (ref 70–99)
Glucose-Capillary: 157 mg/dL — ABNORMAL HIGH (ref 70–99)
Glucose-Capillary: 202 mg/dL — ABNORMAL HIGH (ref 70–99)
Glucose-Capillary: 173 mg/dL — ABNORMAL HIGH (ref 70–99)

## 2021-02-18 LAB — MAGNESIUM: Magnesium: 1.7 mg/dL (ref 1.7–2.4)

## 2021-02-18 LAB — CBC
HCT: 23.4 % — ABNORMAL LOW (ref 36.0–46.0)
Hemoglobin: 7.5 g/dL — ABNORMAL LOW (ref 12.0–15.0)
MCH: 29.4 pg (ref 26.0–34.0)
MCHC: 32.1 g/dL (ref 30.0–36.0)
MCV: 91.8 fL (ref 80.0–100.0)
Platelets: 184 10*3/uL (ref 150–400)
RBC: 2.55 MIL/uL — ABNORMAL LOW (ref 3.87–5.11)
RDW: 15.3 % (ref 11.5–15.5)
WBC: 7.7 10*3/uL (ref 4.0–10.5)
nRBC: 0 % (ref 0.0–0.2)

## 2021-02-18 LAB — PREPARE RBC (CROSSMATCH)

## 2021-02-18 LAB — PHOSPHORUS: Phosphorus: 3.7 mg/dL (ref 2.5–4.6)

## 2021-02-18 MED ORDER — SODIUM CHLORIDE 0.9% IV SOLUTION: Freq: Once | INTRAVENOUS | Status: AC

## 2021-02-18 MED ORDER — VITAMIN D (ERGOCALCIFEROL) 1.25 MG (50000 UNIT) PO CAPS
50000.0000 [IU] | ORAL_CAPSULE | ORAL | Status: DC
  Administered 2021-02-18: 50000 [IU] via ORAL
  Filled 2021-02-18: qty 1

## 2021-02-18 MED ORDER — GADOBUTROL 1 MMOL/ML IV SOLN
10.0000 mL | Freq: Once | INTRAVENOUS | Status: AC | PRN
  Administered 2021-02-18: 10 mL via INTRAVENOUS

## 2021-02-18 MED ORDER — CYANOCOBALAMIN 1000 MCG/ML IJ SOLN
1000.0000 ug | Freq: Every day | INTRAMUSCULAR | Status: DC
  Administered 2021-02-18 – 2021-02-20 (×3): 1000 ug via INTRAMUSCULAR
  Filled 2021-02-18 (×3): qty 1

## 2021-02-18 NOTE — Progress Notes (Signed)
[facility] Progress Note  Patient: [name]    NOB:[id number]  DOA: 02/15/2021     Date of Service: the patient was seen and examined on 02/18/2021  Chief Complaint  Patient presents with  . Altered Mental Status   Brief hospital course:  Brief history.  Patient admitted 02/15/2021 with acute metabolic encephalopathy.  She was found to be dehydrated and started on IV fluids.  Patient had a recent lumbar surgery by Dr. Trenton Gammon over at [facility].  Seen by Dr. Julien Nordmann neurosurgery here and does not think this is an infection of the lumbar spine.  Patient had word finding issues today.  Past medical history of seizure, hypertension, hyperlipidemia, type 2 diabetes mellitus, GERD, depression, chronic pain, chronic diastolic congestive heart failure. Assessment and Plan:  # Acute metabolic encephalopathy with word finding problems.  MRI of the brain negative for acute stroke.  Likely acute metabolic encephalopathy secondary to dehydration and acute kidney injury.  Patient does have some paranoid thoughts thinking that her daughter stole her car.  Continue IV Haldol as needed.  Resumed home meds Continue supportive care Acute encephalopathy seems to be resolving, patient is AOx3 today.   # Acute kidney injury on chronic kidney disease stage IIIa.  Creatinine 1.82 on presentation and down to 1.19--0.96.  Gentle IV fluid hydration. Creatinine improving  # Recent back surgery.  Physical therapy evaluation but awaiting back brace.  Nursing staff states that this man been bleeding from the wound site.  Hold aspirin and heparin subcu. Continue SCDs  # Anemia, acute blood loss.  Hemoglobin dropped down to 7.8.  On prior to discharge was 10.3.  Decrease rate of IV fluids.  Discontinue aspirin and heparin subcu.   Iron level 37, saturation 13%, slightly lower and, folate 15.0 Vitamin B12 level 230, vitamin B12 1000 mcg IM injection daily during hospital stay, start oral supplement on  discharge  # Vitamin D insufficiency, started vitamin D supplement  # Depression continue psychiatric medications duloxetine, Viibryd, Seroquel and Topamax at night Viibryd is nonformulary, patient may bring from her home  # Hypothyroidism unspecified on levothyroxine  # Type 2 diabetes mellitus with hyperlipidemia on atorvastatin.  Restart Lantus low dose 8 units at night.  As per family does take 72 units at night.  Last hemoglobin A1c 6.6.  # 7 mm lesion left eccentric canal at the craniocervical junction.  Radiology recommended contrasted MRI of the cervical spine to further characterize.  With acute kidney injury I will hold off on ordering that at this time. MRI cervical spine: 7 x 9 x 7 mm lobular avidly enhancing extra-axial dural-based mass within the left posterolateral aspect of the spinal canal at the C1 level. 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 This exerts local mass effect upon the left dorsal lateral aspect of the spinal cord with mild spinal cord flattening. No underlying spinal cord signal abnormality. Follow neurosurgery consult   Body mass index is 44.29 kg/m.   Interventions:       Diet: Soft diabetic diet DVT Prophylaxis: SCD, pharmacological prophylaxis contraindicated due to Bleeding from back due to recent surgery   Advance goals of care discussion: DNR  Family Communication: family was NOT present at bedside, at the time of interview.  The pt provided permission to discuss medical plan with the family. Opportunity was given to ask question and all questions were answered satisfactorily.   Disposition:  Pt is from Home, admitted with AKI, dehydration and bleeding from recent  back surgery, still has anemia, MRI done today and patient will get 1 unit of PRBC today, which precludes a safe discharge. Discharge to SNF in 1 to 2 days when she will be medically stable. Patient is refusing SNF then patient can be transferred to home with  home health services    Subjective: No significant overnight issues, overall she feels improvement, AAO x3.  Back pain is under control.  Patient denied any other active issues. Patient has dyspnea on exertion and feels tired all the time.  Patient agreed with the blood transfusion due to symptomatic anemia and patient also agreed for MRI cervical spine   Physical Exam: General:  alert oriented to time, place, and person.  Appear in mild distress, affect appropriate Eyes: PERRLA ENT: Oral Mucosa Clear, moist  Neck: no JVD,  Cardiovascular: S1 and S2 Present, no Murmur,  Respiratory: good respiratory effort, Bilateral Air entry equal and Decreased, no Crackles, no wheezes Abdomen: Bowel Sound present, Soft and no tenderness,  Skin: no rashes Extremities: no Pedal edema, no calf tenderness Neurologic: without any new focal findings Gait not checked due to patient safety concerns  Vitals:   02/18/21 0810 02/18/21 1305 02/18/21 1417 02/18/21 1430  BP: (!) 139/59 (!) 150/64 (!) 135/53 140/63  Pulse: 64 66 72 67  Resp: 16 18 18 18   Temp: 98.3 F (36.8 C) 98.1 F (36.7 C) 98.3 F (36.8 C) 98.3 F (36.8 C)  TempSrc: Oral Oral Oral Oral  SpO2: 99% 100% 100% 99%  Weight:      Height:        Intake/Output Summary (Last 24 hours) at 02/18/2021 1542 Last data filed at 02/18/2021 0815 Gross per 24 hour  Intake 679.24 ml  Output 1350 ml  Net -670.76 ml   Filed Weights   02/15/21 0933  Weight: 113.4 kg    Data Reviewed: I have personally reviewed and interpreted daily labs, tele strips, imagings as discussed above. I reviewed all nursing notes, pharmacy notes, vitals, pertinent old records I have discussed plan of care as described above with RN and patient/family.  CBC: Recent Labs  Lab 02/15/21 0936 02/15/21 1914 02/16/21 0544 02/17/21 0523 02/18/21 0544  WBC 8.0 8.0 7.8 7.9 7.7  NEUTROABS  --   --  5.2  --   --   HGB 8.3* 8.4* 7.8* 7.4* 7.5*  HCT 26.0* 26.8* 24.8*  22.3* 23.4*  MCV 93.2 93.1 91.9 90.3 91.8  PLT 196 214 192 186 161   Basic Metabolic Panel: Recent Labs  Lab 02/15/21 0936 02/15/21 1914 02/16/21 0544 02/17/21 0523 02/18/21 0544  NA 141  --  141 139 140  K 5.1  --  4.8 3.9 3.6  CL 111  --  111 111 109  CO2 20*  --  21* 22 21*  GLUCOSE 141*  --  131* 131* 142*  BUN 46*  --  28* 19 16  CREATININE 1.82* 1.32* 1.19* 1.04* 0.96  CALCIUM 9.0  --  8.9 8.5* 8.6*  MG  --   --  1.6* 1.6* 1.7  PHOS  --   --  3.2  --  3.7    Studies: MR CERVICAL SPINE W WO CONTRAST  Result Date: 02/18/2021 CLINICAL DATA:  Brain/CNS neoplasm, surveillance. Additional history provided: 7 mm lesion within the left eccentric canal at the craniocervical junction on brain MRI 2 days ago. EXAM: MRI CERVICAL SPINE WITHOUT AND WITH CONTRAST TECHNIQUE: Multiplanar and multiecho pulse sequences of the cervical spine, to include  the craniocervical junction and cervicothoracic junction, were obtained without and with intravenous contrast. CONTRAST:  27mL GADAVIST GADOBUTROL 1 MMOL/ML IV SOLN COMPARISON:  Brain MRI 02/16/2021. CT of the cervical spine 06/12/2020. FINDINGS: Alignment: Cervical levocurvature. No significant spondylolisthesis. Vertebrae: Cervical vertebral body height is maintained. No significant marrow edema or focal suspicious osseous lesion. Mild chronic T2 superior endplate compression deformity. Cord: No spinal cord signal abnormality is identified. Posterior Fossa, vertebral arteries, paraspinal tissues: There is a 7 x 9 x 7 mm avidly enhancing lobular extra-axial mass within the left posterolateral aspect of the spinal canal at the C1 level (series 14, image 12) (series 18, image 5). This mass has imaging features most compatible with a meningioma. The mass contacts and mildly flattens the left dorsal lateral aspect of the cervical spinal cord at this level (best appreciated on series 18, image 5). Overall mild relative spinal canal narrowing. Expected flow  voids within the cervical vertebral arteries. Paraspinal soft tissues within normal limits. Left mastoid effusion. Disc levels: No more than mild disc degeneration at any level. C2-C3: No significant disc herniation or stenosis. C3-C4: Disc bulge. Endplate spurring. Uncovertebral hypertrophy on the left. Facet arthrosis (greater on the left). No significant spinal canal stenosis. Moderate left neural foraminal narrowing. C4-C5: Shallow disc bulge with endplate spurring. Facet arthrosis. No significant spinal canal stenosis. Bilateral neural foraminal narrowing (mild/moderate right, mild left). C5-C6: Small central disc protrusion. Mild facet hypertrophy. Mild relative spinal canal narrowing without spinal cord mass effect. No significant foraminal stenosis. C6-C7: No significant disc herniation or spinal canal stenosis. Facet hypertrophy. Mild relative right neural foraminal narrowing. C7-T1: Mild facet arthrosis. No significant disc herniation or stenosis. IMPRESSION: 7 x 9 x 7 mm lobular avidly enhancing extra-axial dural-based mass within the left posterolateral aspect of the spinal canal at the C1 level. 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 This exerts local mass effect upon the left dorsal lateral aspect of the spinal cord with mild spinal cord flattening. No underlying spinal cord signal abnormality. Cervical spondylosis as described. No more than mild spinal canal stenosis at the remaining levels. Sites of neural foraminal narrowing as described and greatest on the left at C3-C4 (moderate) and on the right at C4-C5 (mild/moderate). Mild chronic T2 vertebral superior endplate compression deformity. Electronically Signed   By: Kellie Simmering DO   On: 02/18/2021 14:49    Scheduled Meds: . atorvastatin  40 mg Oral Daily  . cyanocobalamin  1,000 mcg Intramuscular Daily  . DULoxetine  60 mg Oral q AM  . insulin aspart  0-5 Units Subcutaneous QHS  . insulin aspart  0-9 Units Subcutaneous  TID WC  . insulin glargine  8 Units Subcutaneous QHS  . levothyroxine  75 mcg Oral QAC breakfast  . metoprolol succinate  25 mg Oral Daily  . montelukast  10 mg Oral QPM  . nystatin   Topical BID  . pantoprazole  40 mg Oral Daily  . QUEtiapine  300 mg Oral QHS  . topiramate  200 mg Oral QHS  . Vilazodone HCl  40 mg Oral Daily  . Vitamin D (Ergocalciferol)  50,000 Units Oral Q7 days   Continuous Infusions: . lactated ringers 30 mL/hr at 02/18/21 0637   PRN Meds: acetaminophen, haloperidol lactate, oxyCODONE-acetaminophen  Time spent: 35 minutes  Author: Val Riles. MD [facility] 02/18/2021 3:42 PM  To reach On-call, see care teams to locate the attending and reach out to them via [email or web address]. If 7PM-7AM, please contact night-coverage If  you still have difficulty reaching the attending provider, please page the [facility] (Director on Call) for [facility] on amion for assistance.

## 2021-02-18 NOTE — TOC Transition Note (Signed)
Transition of Care [facility]) - CM/SW Discharge Note   Patient Details  Name: [name] MRN: [id number] Date of Birth: September 29, 1946  Transition of Care [name]) CM/SW Contact:  Whitney Masson, RN Phone Number:[phone] 02/18/2021, 4:31 PM   Clinical Narrative:    Pt declined SNF and [name] will resume. RN called [name]) left voice message concerning resuming services. Spoke with [name] [phone]) daughter and explained the discharge plans and arrangements. Daughter verified pt will have sufficient transportation to her medical appointments, and assist pt with getting all her medications. Pt lives in a duplex with family assistance.   TOC will remain available if needed.  Final next level of care: [name] Barriers to Discharge: No Barriers Identified   Patient Goals and CMS Choice        Discharge Placement                  Name of family member notified: [name] [phone] Patient and family notified of of transfer: 02/18/21  Discharge Plan and Services   Discharge Planning Services: CM Consult                        [name] Agency: [name] Date [place]: 02/18/21 Time [place]: 0258 (Message left pt will resume HHPT with Whitney Lemmings) Representative spoke with at [place]: [name] Determinants of Health ([place]) Interventions     Readmission Risk Interventions Readmission Risk Prevention Plan 03/07/2020  Transportation Screening Complete  PCP or Specialist Appt within 3-5 Days Complete  HRI or [place] Complete  Social Work Consult for [place] Planning/Counseling Complete  Palliative Care Screening Not Applicable  Medication Review Press photographer) Complete  Some recent data might be hidden

## 2021-02-19 DIAGNOSIS — N179 Acute kidney failure, unspecified: Secondary | ICD-10-CM | POA: Diagnosis not present

## 2021-02-19 DIAGNOSIS — N189 Chronic kidney disease, unspecified: Secondary | ICD-10-CM | POA: Diagnosis not present

## 2021-02-19 LAB — GLUCOSE, CAPILLARY
Glucose-Capillary: 162 mg/dL — ABNORMAL HIGH (ref 70–99)
Glucose-Capillary: 179 mg/dL — ABNORMAL HIGH (ref 70–99)
Glucose-Capillary: 140 mg/dL — ABNORMAL HIGH (ref 70–99)
Glucose-Capillary: 268 mg/dL — ABNORMAL HIGH (ref 70–99)

## 2021-02-19 LAB — CBC
HCT: 25.8 % — ABNORMAL LOW (ref 36.0–46.0)
Hemoglobin: 8.2 g/dL — ABNORMAL LOW (ref 12.0–15.0)
MCH: 29.4 pg (ref 26.0–34.0)
MCHC: 31.8 g/dL (ref 30.0–36.0)
MCV: 92.5 fL (ref 80.0–100.0)
Platelets: 176 10*3/uL (ref 150–400)
RBC: 2.79 MIL/uL — ABNORMAL LOW (ref 3.87–5.11)
RDW: 15.4 % (ref 11.5–15.5)
WBC: 8.9 10*3/uL (ref 4.0–10.5)
nRBC: 0.3 % — ABNORMAL HIGH (ref 0.0–0.2)

## 2021-02-19 LAB — BASIC METABOLIC PANEL
Anion gap: 8 (ref 5–15)
BUN: 15 mg/dL (ref 8–23)
CO2: 21 mmol/L — ABNORMAL LOW (ref 22–32)
Calcium: 8.6 mg/dL — ABNORMAL LOW (ref 8.9–10.3)
Chloride: 111 mmol/L (ref 98–111)
Creatinine, Ser: 1.2 mg/dL — ABNORMAL HIGH (ref 0.44–1.00)
GFR, Estimated: 48 mL/min — ABNORMAL LOW (ref >60)
Glucose, Bld: 149 mg/dL — ABNORMAL HIGH (ref 70–99)
Potassium: 3.6 mmol/L (ref 3.5–5.1)
Sodium: 140 mmol/L (ref 135–145)

## 2021-02-19 LAB — TYPE AND SCREEN
ABO/RH(D): B POS
Antibody Screen: NEGATIVE
Unit division: 0

## 2021-02-19 LAB — MAGNESIUM: Magnesium: 1.5 mg/dL — ABNORMAL LOW (ref 1.7–2.4)

## 2021-02-19 LAB — BPAM RBC
Blood Product Expiration Date: 202205102359
ISSUE DATE / TIME: 202204171419
Unit Type and Rh: 7300

## 2021-02-19 LAB — PHOSPHORUS: Phosphorus: 4.3 mg/dL (ref 2.5–4.6)

## 2021-02-19 MED ORDER — MAGNESIUM SULFATE 2 GM/50ML IV SOLN
2.0000 g | Freq: Once | INTRAVENOUS | Status: AC
  Administered 2021-02-19: 2 g via INTRAVENOUS
  Filled 2021-02-19: qty 50

## 2021-02-19 NOTE — Progress Notes (Signed)
Progress Note-neurosurgery   Date: 02/19/2021  Subjective: Ms. [name] appears to be improved but still has some complaints of back pain.  She does still remain slightly confused during conversation but overall improved.  She denies any numbness in her lower extremities.  MRI of the cervical spine have been obtained.  Vital Signs: Temp:  [97.7 F (36.5 C)-98.9 F (37.2 C)] 98.9 F (37.2 C) (04/18 0815) Pulse Rate:  [66-84] 69 (04/18 0815) Resp:  [16-18] 18 (04/18 0815) BP: (104-150)/(44-81) 127/65 (04/18 0815) SpO2:  [92 %-100 %] 93 % (04/18 0815) Temp (24hrs), Avg:98.2 F (36.8 C), Min:97.7 F (36.5 C), Max:98.9 F (37.2 C)  Weight: 113.4 kg @IOLAST2SHIFTS @  Problem List Patient Active Problem List   Diagnosis Date Noted  . Anemia, blood loss   . Hypothyroidism   . AMS (altered mental status) 02/15/2021  . AMS (altered mental status) 02/15/2021  . Lumbar foraminal stenosis 02/09/2021  . Acute kidney injury superimposed on CKD ([place]) 03/06/2020  . Hyperkalemia 03/06/2020  . Acute metabolic encephalopathy 78/29/5621  . Acute respiratory failure with hypoxia ([place]) 02/06/2020  . OSA (obstructive sleep apnea) 02/06/2020  . CPAP ventilation treatment not tolerated 02/06/2020  . Morbid obesity with BMI of 45.0-49.9, adult ([place]) 02/06/2020  . Lactic acidosis 02/06/2020  . possible Sepsis ([place]) 02/06/2020  . Polypharmacy 02/06/2020  . CAD (coronary artery disease) 05/05/2018  . Microscopic hematuria 09/17/2016  . Cystitis, chronic 09/17/2016  . 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 Closed fracture of metatarsal bone 07/22/2016  . Plantar fasciitis, left 05/28/2016  . Degenerative spondylolisthesis 01/02/2016  . Tachycardia 01/02/2016  . Type 2 diabetes mellitus with hyperlipidemia ([place])   . Essential hypertension   . Non-toxic multinodular goiter 10/11/2014  . Diabetes mellitus ([place]) 10/11/2014  . Hyperlipidemia 10/11/2014  . Depression 10/11/2014   . Osteoporosis 10/11/2014  . GERD (gastroesophageal reflux disease) 10/11/2014  . Arthritis 10/11/2014  . Cystocele 10/11/2014  . Chronic pain syndrome 10/11/2014  . Amnesia 10/11/2014  . Back pain 10/11/2014    Medications: Scheduled Meds: . atorvastatin  40 mg Oral Daily  . cyanocobalamin  1,000 mcg Intramuscular Daily  . DULoxetine  60 mg Oral q AM  . insulin aspart  0-5 Units Subcutaneous QHS  . insulin aspart  0-9 Units Subcutaneous TID WC  . insulin glargine  8 Units Subcutaneous QHS  . levothyroxine  75 mcg Oral QAC breakfast  . metoprolol succinate  25 mg Oral Daily  . montelukast  10 mg Oral QPM  . nystatin   Topical BID  . pantoprazole  40 mg Oral Daily  . QUEtiapine  300 mg Oral QHS  . topiramate  200 mg Oral QHS  . Vilazodone HCl  40 mg Oral Daily  . Vitamin D (Ergocalciferol)  50,000 Units Oral Q7 days   Continuous Infusions: . lactated ringers 30 mL/hr at 02/18/21 0637  . magnesium sulfate bolus IVPB     PRN Meds:.acetaminophen, haloperidol lactate, oxyCODONE-acetaminophen  Labs:  Lab Results  Component Value Date   WBC 8.9 02/19/2021   WBC 7.7 02/18/2021   HCT 25.8 (L) 02/19/2021   HCT 23.4 (L) 02/18/2021   HCT 41.8 01/30/2014   HCT 41.5 11/18/2013   PLT 176 02/19/2021   PLT 184 02/18/2021   PLT 176 06/20/2014   PLT 125 (L) 01/30/2014    Lab Results  Component Value Date   INR 1.1 07/12/2008   APTT 28 07/12/2008   Lab Results  Component Value Date  NA 140 02/19/2021   NA 140 02/18/2021   NA 138 06/20/2014   NA 135 (L) 01/30/2014   K 3.6 02/19/2021   K 3.6 02/18/2021   K 4.3 06/20/2014   K 4.2 01/30/2014   BUN 15 02/19/2021   BUN 16 02/18/2021   BUN 15 06/20/2014   BUN 22 (H) 01/30/2014    Lab Results  Component Value Date   MG 1.5 (L) 02/19/2021   MRI Cervical Spine: 7 x 9 x 7 mm lobular avidly enhancing extra-axial dural-based mass within the left posterolateral aspect of the spinal canal at the C1 level. The imaging  features are most compatible with meningioma. This exerts local mass effect upon the left dorsal lateral aspect of the spinal cord with mild spinal cord flattening. No underlying spinal cord signal abnormality.  Cervical spondylosis as described. No more than mild spinal canal stenosis at the remaining levels. Sites of neural foraminal narrowing as described and greatest on the left at C3-C4 (moderate) and on the right at C4-C5 (mild/moderate).   Assessment/plan: Ms. work appears to be improving and will continue with medical care for her confusion.  She can follow-up with her primary surgeon for healing of the lumbar spine.  We did briefly discussed that the MRI of the cervical spine does show a small mass which appears to be benign in nature.  No intervention is needed for this at this time, but surveillance in the future may be necessary.  She can discuss this with her primary surgeon but we are also going to see her in the outpatient clinic at the [facility] if she desires once cleared from her lumbar spine.  Neurosurgery to sign off at this time, please reconsult if any additional concerns.  Whitney Perla, MD

## 2021-02-19 NOTE — Progress Notes (Signed)
Orders received from Dr. Lacinda Axon to change dressing.  Dressing changed -incision approximated with constant oozing of blood noted from lower end of incision.  MD informed via tet page.

## 2021-02-19 NOTE — Progress Notes (Signed)
[facility] Progress Note  Patient: [name]    PYK:[id number]  DOA: 02/15/2021     Date of Service: the patient was seen and examined on 02/19/2021  Chief Complaint  Patient presents with  . Altered Mental Status   Brief hospital course:  Brief history.  Patient admitted 02/15/2021 with acute metabolic encephalopathy.  She was found to be dehydrated and started on IV fluids.  Patient had a recent lumbar surgery by Dr. Trenton Gammon over at [facility].  Seen by Dr. Julien Nordmann neurosurgery here and does not think this is an infection of the lumbar spine.  Patient had word finding issues today.  Past medical history of seizure, hypertension, hyperlipidemia, type 2 diabetes mellitus, GERD, depression, chronic pain, chronic diastolic congestive heart failure. Assessment and Plan:  # Acute metabolic encephalopathy with word finding problems.  MRI of the brain negative for acute stroke.  Likely acute metabolic encephalopathy secondary to dehydration and acute kidney injury.  Patient does have some paranoid thoughts thinking that her daughter stole her car.  Continue IV Haldol as needed.  Resumed home meds Continue supportive care Acute encephalopathy seems to be resolving, patient is AOx3 today.   # Acute kidney injury on chronic kidney disease stage IIIa.  Creatinine 1.82 on presentation and down to 1.19--0.96.  Gentle IV fluid hydration. Creatinine improving  # Recent back surgery.  Physical therapy evaluation but awaiting back brace.  Nursing staff states that this man been bleeding from the wound site.  Hold aspirin and heparin subcu. Continue SCDs Neurosurgery is following for persistent bleeding from the incision site, we will try to switch her today if not then patient may need to be transferred to main campus for surgical intervention   # Anemia, acute blood loss.  Hemoglobin dropped down to 7.8.  On prior to discharge was 10.3.  Decrease rate of IV fluids.  Discontinue aspirin and  heparin subcu.   Iron level 37, saturation 13%, slightly lower and, folate 15.0 Vitamin B12 level 230, vitamin B12 1000 mcg IM injection daily during hospital stay, start oral supplement on discharge  # Vitamin D insufficiency, started vitamin D supplement  # Depression continue psychiatric medications duloxetine, Viibryd, Seroquel and Topamax at night Viibryd is nonformulary, patient may bring from her home  # Hypothyroidism unspecified on levothyroxine  # Type 2 diabetes mellitus with hyperlipidemia on atorvastatin.  Restart Lantus low dose 8 units at night.  As per family does take 72 units at night.  Last hemoglobin A1c 6.6.  # 7 mm lesion left eccentric canal at the craniocervical junction.  Radiology recommended contrasted MRI of the cervical spine to further characterize.  With acute kidney injury I will hold off on ordering that at this time. MRI cervical spine: 7 x 9 x 7 mm lobular avidly enhancing extra-axial dural-based mass within the left posterolateral aspect of the spinal canal at the C1 level. 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 This exerts local mass effect upon the left dorsal lateral aspect of the spinal cord with mild spinal cord flattening. No underlying spinal cord signal abnormality. Neurosurgery recommended no intervention, lesion may be benign but is evidence needed as an outpatient.   Body mass index is 44.29 kg/m.   Interventions:       Diet: Soft diabetic diet DVT Prophylaxis: SCD, pharmacological prophylaxis contraindicated due to Bleeding from back due to recent surgery   Advance goals of care discussion: DNR  Family Communication: family was NOT present at bedside,  at the time of interview.  The pt provided permission to discuss medical plan with the family. Opportunity was given to ask question and all questions were answered satisfactorily.   Disposition:  Pt is from Home, admitted with AKI, dehydration and bleeding from  recent back surgery, s/p 1 unit PRBC transfusion.  Still has ongoing bleeding, neuro surgery evaluation pending, which precludes a safe discharge. Discharge to SNF in 1 to 2 days when she will be medically stable. Patient is refusing SNF then patient can be transferred to home with home health services    Subjective: No significant overnight issues, overall she feels improvement, AAO x3.  Back pain is under control.  Patient denied any other active issues. Patient still has ongoing bleeding from the incision site from the recent back surgery so neurosurgery was consulted again for intervention, we will continue to monitor patient today and plan for disposition tomorrow a.m.     Physical Exam: General:  alert oriented to time, place, and person.  Appear in mild distress, affect appropriate Eyes: PERRLA ENT: Oral Mucosa Clear, moist  Neck: no JVD,  Cardiovascular: S1 and S2 Present, no Murmur,  Respiratory: good respiratory effort, Bilateral Air entry equal and Decreased, no Crackles, no wheezes Abdomen: Bowel Sound present, Soft and no tenderness,  Skin: no rashes Extremities: no Pedal edema, no calf tenderness Neurologic: without any new focal findings Gait not checked due to patient safety concerns  Vitals:   02/18/21 2024 02/19/21 0003 02/19/21 0349 02/19/21 0815  BP: 104/81 (!) 114/52 (!) 120/56 127/65  Pulse: 71 84 74 69  Resp: 17 18 18 18   Temp: 98.2 F (36.8 C) 98.2 F (36.8 C) 97.7 F (36.5 C) 98.9 F (37.2 C)  TempSrc: Oral Oral Oral   SpO2: 95% 93% 92% 93%  Weight:      Height:        Intake/Output Summary (Last 24 hours) at 02/19/2021 1601 Last data filed at 02/19/2021 0100 Gross per 24 hour  Intake 1262 ml  Output 750 ml  Net 512 ml   Filed Weights   02/15/21 0933  Weight: 113.4 kg    Data Reviewed: I have personally reviewed and interpreted daily labs, tele strips, imagings as discussed above. I reviewed all nursing notes, pharmacy notes, vitals,  pertinent old records I have discussed plan of care as described above with RN and patient/family.  CBC: Recent Labs  Lab 02/15/21 1914 02/16/21 0544 02/17/21 0523 02/18/21 0544 02/19/21 0336  WBC 8.0 7.8 7.9 7.7 8.9  NEUTROABS  --  5.2  --   --   --   HGB 8.4* 7.8* 7.4* 7.5* 8.2*  HCT 26.8* 24.8* 22.3* 23.4* 25.8*  MCV 93.1 91.9 90.3 91.8 92.5  PLT 214 192 186 184 322   Basic Metabolic Panel: Recent Labs  Lab 02/15/21 0936 02/15/21 1914 02/16/21 0544 02/17/21 0523 02/18/21 0544 02/19/21 0336  NA 141  --  141 139 140 140  K 5.1  --  4.8 3.9 3.6 3.6  CL 111  --  111 111 109 111  CO2 20*  --  21* 22 21* 21*  GLUCOSE 141*  --  131* 131* 142* 149*  BUN 46*  --  28* 19 16 15   CREATININE 1.82* 1.32* 1.19* 1.04* 0.96 1.20*  CALCIUM 9.0  --  8.9 8.5* 8.6* 8.6*  MG  --   --  1.6* 1.6* 1.7 1.5*  PHOS  --   --  3.2  --  3.7 4.3  Studies: No results found.  Scheduled Meds: . atorvastatin  40 mg Oral Daily  . cyanocobalamin  1,000 mcg Intramuscular Daily  . DULoxetine  60 mg Oral q AM  . insulin aspart  0-5 Units Subcutaneous QHS  . insulin aspart  0-9 Units Subcutaneous TID WC  . insulin glargine  8 Units Subcutaneous QHS  . levothyroxine  75 mcg Oral QAC breakfast  . metoprolol succinate  25 mg Oral Daily  . montelukast  10 mg Oral QPM  . nystatin   Topical BID  . pantoprazole  40 mg Oral Daily  . QUEtiapine  300 mg Oral QHS  . topiramate  200 mg Oral QHS  . Vilazodone HCl  40 mg Oral Daily  . Vitamin D (Ergocalciferol)  50,000 Units Oral Q7 days   Continuous Infusions: . lactated ringers 30 mL/hr at 02/18/21 0637   PRN Meds: acetaminophen, haloperidol lactate, oxyCODONE-acetaminophen  Time spent: 35 minutes  Author: Val Riles. MD [facility] 02/19/2021 4:01 PM  To reach On-call, see care teams to locate the attending and reach out to them via [email or web address]. If 7PM-7AM, please contact night-coverage If you still have difficulty reaching the  attending provider, please page the [facility] (Director on Call) for [facility] on amion for assistance.

## 2021-02-19 NOTE — Progress Notes (Addendum)
Surgical back dressing saturated with old and new blood leaking onto bed pad.  Dr Dwyane Dee notified and assessed dressing at bedside.  Text sent to on call Neurosurg, Dr. Lacinda Axon, via secure chat.  Awaiting response / orders.

## 2021-02-19 NOTE — Progress Notes (Signed)
New surgical back drsg placed 2 hours ago is now saturated with fresh blood.  Dr. Lacinda Axon notified via secure chat.

## 2021-02-19 NOTE — Progress Notes (Signed)
Physical Therapy Treatment Patient Details Name: [name] MRN: [id number] DOB: 12-13-1945 Today's Date: 02/19/2021    History of Present Illness Pt is a 75 y/o F admitted from home with c/c of AMS. Pt with revision & extension of L5-S1 PLIF with L4-S1 on 02/09/21 by Dr. Annette Stable at [place] d/c home with [facility] f/u. Pt being treated for acute metabolic encephalopathy of unclear etiology. PMH: HTN, DM2, hypothyroidism, anxiety, arthritis, CHF, CKD, chronic pain syndrome, depression, GERD, HLD, IBS, obesity, osteoporosis, panic attacks, seizures, sleep apnea    PT Comments    Pt seen for PT treatment, reluctantly agreeable to tx with PT providing education/encouragement for OOB mobility. PT removed bed covers & observed bed sheets saturated with urine, with pt appearing to know she had urinated but no awareness of needing to get cleaned up as she did not mention it prior to this PT. Pt's daughter then enters room, "why is she covered in piss? If she has to spend one more night covered in piss I'll go to jail. I'll go to jail". PT attempts to educate pt's daughter on pt's confusion but pt states "no I'm not". Nurse entered room & PT attempted to assist pt OOB to recliner to allow sheets to be changed. PT dons lumbar corset total assist & attempts to provide education/cuing re: safe hand placement for sit<>stand transfers but pt's daughter begins to assist her with sit>stand. Pt in standing, pivoting to recliner when MD arrives to inspect incision. Pt assisted to sitting EOB & left in care of nurse & MD.  Lower part of pt's incision observed saturated with bloody drainage, dripping onto floor when pt stands. Nurse & MD already aware of ongoing issue.      Follow Up Recommendations  Supervision/Assistance - 24 hour;SNF     Equipment Recommendations  Rolling walker with 5" wheels;3in1 (PT)    Recommendations for Other Services       Precautions / Restrictions Precautions Precautions:  Fall;Back Required Braces or Orthoses: Spinal Brace Spinal Brace: Lumbar corset;Applied in sitting position Restrictions Weight Bearing Restrictions: No    Mobility  Bed Mobility Overal bed mobility: Needs Assistance Bed Mobility: Rolling;Sidelying to Sit Rolling: Supervision;+2 for safety/equipment Sidelying to sit: Supervision;HOB elevated       General bed mobility comments: use of bed rails; poor awareness as pt rolls to get out of side of bed with bed rail up vs moving towards side with bed rail down    Transfers Overall transfer level: Needs assistance Equipment used: Rolling walker (2 wheeled) Transfers: Sit to/from Stand Sit to Stand: Min assist Stand pivot transfers: Min assist       General transfer comment: cuing but poor return demo of safe hand placement during sit<>stand  Ambulation/Gait                 Stairs             Wheelchair Mobility    Modified Rankin (Stroke Patients Only)       Balance Overall balance assessment: Needs assistance Sitting-balance support: Feet supported;No upper extremity supported Sitting balance-Leahy Scale: Good Sitting balance - Comments: supervision sitting EOB   Standing balance support: Bilateral upper extremity supported;During functional activity Standing balance-Leahy Scale: Fair Standing balance comment: BUE support on RW in standing/gait                            Cognition Arousal/Alertness: Awake/alert Behavior During Therapy: [facility]  for tasks assessed/performed;Anxious;Agitated Overall Cognitive Status: Difficult to assess Area of Impairment: Attention;Memory;Following commands;Safety/judgement;Awareness;Problem solving                 Orientation Level: Disoriented to;Situation   Memory: Decreased recall of precautions;Decreased short-term memory Following Commands: Follows one step commands with increased time Safety/Judgement: Decreased awareness of safety;Decreased  awareness of deficits Awareness: Emergent;Anticipatory Problem Solving: Slow processing;Difficulty sequencing;Requires verbal cues General Comments: Pt only able to recall 1/3 back precautions, requires cuing to implement precautions into mobility. Unaware of sitting in her own urine in bed.      Exercises Other Exercises Other Exercises: Education on spinal precautions    General Comments       Pertinent Vitals/Pain Pain Assessment: Faces Faces Pain Scale: Hurts whole lot Pain Location: back Pain Descriptors / Indicators: Sore Pain Intervention(s): RN gave pain meds during session;Monitored during session    Home Living                      Prior Function            PT Goals (current goals can now be found in the care plan section) Acute Rehab PT Goals Patient Stated Goal: to go home PT Goal Formulation: With patient Time For Goal Achievement: 03/02/21 Potential to Achieve Goals: Fair Progress towards PT goals: PT to reassess next treatment    Frequency    Min 2X/week      PT Plan Current plan remains appropriate    Co-evaluation              AM-PAC PT "6 Clicks" Mobility   Outcome Measure  Help needed turning from your back to your side while in a flat bed without using bedrails?: A Little Help needed moving from lying on your back to sitting on the side of a flat bed without using bedrails?: A Little Help needed moving to and from a bed to a chair (including a wheelchair)?: A Little Help needed standing up from a chair using your arms (e.g., wheelchair or bedside chair)?: A Little Help needed to walk in hospital room?: A Little Help needed climbing 3-5 steps with a railing? : A Lot 6 Click Score: 17    End of Session Equipment Utilized During Treatment: Back brace Activity Tolerance: Other (comment) (limited by irritability, MD in room to assess back incision) Patient left: with nursing/sitter in room;in bed   PT Visit Diagnosis:  Unsteadiness on feet (R26.81);Muscle weakness (generalized) (M62.81);Pain Pain - part of body:  (back)     Time: 1761-6073 PT Time Calculation (min) (ACUTE ONLY): 11 min  Charges:  $Therapeutic Activity: 8-22 mins                     Lavone Nian, PT, DPT 02/19/21, 4:00 PM    Waunita Schooner 02/19/2021, 3:57 PM

## 2021-02-19 NOTE — Progress Notes (Signed)
Occupational Therapy Treatment Patient Details Name: [name] MRN: [id number] DOB: [birth year] Today's Date: 02/19/2021    History of present illness Pt is a 75 y/o F admitted from home with c/c of AMS. Pt with revision & extension of L5-S1 PLIF with L4-S1 on 02/09/21 by Dr. Annette Stable at [place] d/c home with [facility] f/u. Pt being treated for acute metabolic encephalopathy of unclear etiology. PMH: HTN, DM2, hypothyroidism, anxiety, arthritis, CHF, CKD, chronic pain syndrome, depression, GERD, HLD, IBS, obesity, osteoporosis, panic attacks, seizures, sleep apnea   OT comments  Pt seen for OT treatment on this date. Upon arrival to room, pt awake and seated upright in bed with RN at bedside. Pt with improved cogntion this date, oriented to month/year and situation, however unable to recall back precautions at start of session, and requiring cues to adhere to precautions throughout session. Pt re-educated on functional application of back precautions, log roll technique, AE/DME for bathing/dressing/ toileting, and home/routines modifications, with pt able to verbalize 2/3 precautions at end of session. Pt required MIN A for bed mobility via log-roll, SUPERVISION/SET-UP for seated UB ADLs at EOB, MAX A for seated LB dressing, and MIN A for stand pivot transfer bed>chair with RW.   Following functional mobility pt appeared to have saturated dressing at incision site, with incision site leaking blood on floor during stand pivot transfer; RN informed, MD brought to bedside to inspect, and neurosurgeon informed. MD okay'd pt to perform stand pivot transfer to chair without TLSO d/t saturated dressing. Pt is making good progress toward goals and continues to benefit from skilled OT services to maximize return to PLOF and facilitate recall and carry over of learned precautions/techniques during ADLs and functional mobility. Will continue to follow POC. Discharge recommendation remains appropriate.      SNF;Supervision/Assistance - 24 hour    Equipment Recommendations  3 in 1 bedside commode;Tub/shower seat;Other (comment) (2ww)       Precautions / Restrictions Precautions Precautions: Fall;Back Required Braces or Orthoses: Spinal Brace Spinal Brace: Thoracolumbosacral orthotic;Applied in sitting position Restrictions Weight Bearing Restrictions: No       Mobility Bed Mobility Overal bed mobility: Needs Assistance Bed Mobility: Rolling;Sidelying to Sit Rolling: Min guard Sidelying to sit: Min assist       General bed mobility comments: Required increased time and cues for technique    Transfers Overall transfer level: Needs assistance Equipment used: Rolling walker (2 wheeled) Transfers: Sit to/from [facility] Sit to Stand: Min guard Stand pivot transfers: Min assist       General transfer comment: cues for safety as well as increased time    Balance Overall balance assessment: Needs assistance Sitting-balance support: Feet supported;No upper extremity supported Sitting balance-Leahy Scale: Good Sitting balance - Comments: supervision sitting EOB   Standing balance support: Bilateral upper extremity supported;During functional activity Standing balance-Leahy Scale: Fair Standing balance comment: BUE support on RW in standing/gait                           ADL either performed or assessed with clinical judgement   ADL Overall ADL's : Needs assistance/impaired     Grooming: Oral care;Set up;Supervision/safety;Sitting           Upper Body Dressing : Supervision/safety;Set up;Sitting Upper Body Dressing Details (indicate cue type and reason): to don/doff hospital gown Lower Body Dressing: Maximal assistance;Sitting/lateral leans Lower Body Dressing Details (indicate cue type and reason): Required cues to adhere to  precautions. Pt attempted to don/doff socks via figure-4 position, however required MAX A                                Cognition Arousal/Alertness: Awake/alert Behavior During Therapy: WFL for tasks assessed/performed;Anxious;Agitated Overall Cognitive Status: No family/caregiver present to determine baseline cognitive functioning Area of Impairment: Attention;Memory;Following commands;Safety/judgement;Awareness;Problem solving                   Current Attention Level: Focused Memory: Decreased recall of precautions;Decreased short-term memory Following Commands: Follows one step commands with increased time Safety/Judgement: Decreased awareness of safety;Decreased awareness of deficits Awareness: Emergent;Anticipatory Problem Solving: Slow processing;Difficulty sequencing;Requires verbal cues General Comments: Pt with improved cogntion this date, oriented to month/year and situation, however unable to recall back precautions at start of session, requiring cues to adhere to precautions throughout session. Pt able to verbalize 2/3 at end of session,        Exercises Other Exercises Other Exercises: Education on spinal precautions      General Comments Pt had saturated dressing at incision site and incision site leaking on floor during functional mobility; RN informed, MD brought to bedside to inspect, and neurosurgeon informed. MD okay'd pt to perform stand pivot transfer to chair without TLSO d/t saturated dressing.    Pertinent Vitals/ Pain       Pain Assessment: 0-10 Pain Score: 2  Pain Location: back Pain Descriptors / Indicators: Sore Pain Intervention(s): Limited activity within patient's tolerance;Monitored during session         Frequency  Min 2X/week        Progress Toward Goals  OT Goals(current goals can now be found in the care plan section)  Progress towards OT goals: Progressing toward goals  Acute Rehab OT Goals Patient Stated Goal: to go home OT Goal Formulation: With patient/family Time For Goal Achievement: 03/02/21 Potential to Achieve  Goals: Good  Plan Discharge plan remains appropriate;Frequency remains appropriate       AM-PAC OT "6 Clicks" Daily Activity     Outcome Measure   Help from another person eating meals?: None Help from another person taking care of personal grooming?: A Little Help from another person toileting, which includes using toliet, bedpan, or urinal?: A Lot Help from another person bathing (including washing, rinsing, drying)?: A Lot Help from another person to put on and taking off regular upper body clothing?: A Little Help from another person to put on and taking off regular lower body clothing?: A Lot 6 Click Score: 16    End of Session Equipment Utilized During Treatment: Rolling walker  OT Visit Diagnosis: Unsteadiness on feet (R26.81);Muscle weakness (generalized) (M62.81);Dizziness and giddiness (R42)   Activity Tolerance Patient tolerated treatment well   Patient Left in chair;with call bell/phone within reach;with chair alarm set   Nurse Communication Mobility status;Other (comment) (saturated dressing)        Time: 2751-7001 OT Time Calculation (min): 35 min  Charges: OT General Charges $OT Visit: 1 Visit OT Treatments $Self Care/Home Management : 23-37 mins  Fredirick Maudlin, OTR/L [place]

## 2021-02-20 DIAGNOSIS — E1169 Type 2 diabetes mellitus with other specified complication: Secondary | ICD-10-CM | POA: Diagnosis not present

## 2021-02-20 DIAGNOSIS — D500 Iron deficiency anemia secondary to blood loss (chronic): Secondary | ICD-10-CM | POA: Diagnosis not present

## 2021-02-20 DIAGNOSIS — G9341 Metabolic encephalopathy: Secondary | ICD-10-CM | POA: Diagnosis not present

## 2021-02-20 DIAGNOSIS — N179 Acute kidney failure, unspecified: Secondary | ICD-10-CM | POA: Diagnosis not present

## 2021-02-20 LAB — BASIC METABOLIC PANEL
Anion gap: 8 (ref 5–15)
BUN: 15 mg/dL (ref 8–23)
CO2: 21 mmol/L — ABNORMAL LOW (ref 22–32)
Calcium: 8.5 mg/dL — ABNORMAL LOW (ref 8.9–10.3)
Chloride: 111 mmol/L (ref 98–111)
Creatinine, Ser: 1.2 mg/dL — ABNORMAL HIGH (ref 0.44–1.00)
GFR, Estimated: 48 mL/min — ABNORMAL LOW (ref >60)
Glucose, Bld: 237 mg/dL — ABNORMAL HIGH (ref 70–99)
Potassium: 3.6 mmol/L (ref 3.5–5.1)
Sodium: 140 mmol/L (ref 135–145)

## 2021-02-20 LAB — CULTURE, BLOOD (ROUTINE X 2)
Culture: NO GROWTH
Culture: NO GROWTH
Special Requests: ADEQUATE

## 2021-02-20 LAB — GLUCOSE, CAPILLARY
Glucose-Capillary: 189 mg/dL — ABNORMAL HIGH (ref 70–99)
Glucose-Capillary: 227 mg/dL — ABNORMAL HIGH (ref 70–99)

## 2021-02-20 LAB — CBC
HCT: 26.6 % — ABNORMAL LOW (ref 36.0–46.0)
Hemoglobin: 8.6 g/dL — ABNORMAL LOW (ref 12.0–15.0)
MCH: 29.8 pg (ref 26.0–34.0)
MCHC: 32.3 g/dL (ref 30.0–36.0)
MCV: 92 fL (ref 80.0–100.0)
Platelets: 175 10*3/uL (ref 150–400)
RBC: 2.89 MIL/uL — ABNORMAL LOW (ref 3.87–5.11)
RDW: 15.3 % (ref 11.5–15.5)
WBC: 7.9 10*3/uL (ref 4.0–10.5)
nRBC: 0 % (ref 0.0–0.2)

## 2021-02-20 LAB — MAGNESIUM: Magnesium: 1.7 mg/dL (ref 1.7–2.4)

## 2021-02-20 LAB — PHOSPHORUS: Phosphorus: 3.6 mg/dL (ref 2.5–4.6)

## 2021-02-20 MED ORDER — LISINOPRIL 10 MG PO TABS: 10.0000 mg | ORAL_TABLET | Freq: Every day | ORAL | 0 refills | Status: DC

## 2021-02-20 MED ORDER — OSCAL 500/200 D-3 500-200 MG-UNIT PO TABS: 1.0000 | ORAL_TABLET | Freq: Two times a day (BID) | ORAL | 3 refills | Status: DC

## 2021-02-20 MED ORDER — LANTUS SOLOSTAR 100 UNIT/ML ~~LOC~~ SOPN: 15.0000 [IU] | PEN_INJECTOR | Freq: Every day | SUBCUTANEOUS | 11 refills | Status: DC

## 2021-02-20 NOTE — Plan of Care (Signed)
Problem: Education: Goal: Knowledge of General Education information will improve Description: Including pain rating scale, medication(s)/side effects and non-pharmacologic comfort measures 02/20/2021 1313 by Conard Novak, RN Outcome: Adequate for Discharge 02/20/2021 1313 by Conard Novak, RN Outcome: Adequate for Discharge   Problem: Health Behavior/Discharge Planning: Goal: Ability to manage health-related needs will improve 02/20/2021 1313 by Conard Novak, RN Outcome: Adequate for Discharge 02/20/2021 1313 by Conard Novak, RN Outcome: Adequate for Discharge   Problem: Clinical Measurements: Goal: Ability to maintain clinical measurements within normal limits will improve 02/20/2021 1313 by Conard Novak, RN Outcome: Adequate for Discharge 02/20/2021 1313 by Conard Novak, RN Outcome: Adequate for Discharge Goal: Will remain free from infection 02/20/2021 1313 by Conard Novak, RN Outcome: Adequate for Discharge 02/20/2021 1313 by Conard Novak, RN Outcome: Adequate for Discharge Goal: Diagnostic test results will improve 02/20/2021 1313 by Conard Novak, RN Outcome: Adequate for Discharge 02/20/2021 1313 by Conard Novak, RN Outcome: Adequate for Discharge Goal: Respiratory complications will improve 02/20/2021 1313 by Conard Novak, RN Outcome: Adequate for Discharge 02/20/2021 1313 by Conard Novak, RN Outcome: Adequate for Discharge Goal: Cardiovascular complication will be avoided 02/20/2021 1313 by Conard Novak, RN Outcome: Adequate for Discharge 02/20/2021 1313 by Conard Novak, RN Outcome: Adequate for Discharge   Problem: Activity: Goal: Risk for activity intolerance will decrease 02/20/2021 1313 by Conard Novak, RN Outcome: Adequate for Discharge 02/20/2021 1313 by Conard Novak, RN Outcome: Adequate for Discharge   Problem: Nutrition: Goal: Adequate nutrition will be maintained 02/20/2021 1313 by  Conard Novak, RN Outcome: Adequate for Discharge 02/20/2021 1313 by Conard Novak, RN Outcome: Adequate for Discharge   Problem: Coping: Goal: Level of anxiety will decrease 02/20/2021 1313 by Conard Novak, RN Outcome: Adequate for Discharge 02/20/2021 1313 by Conard Novak, RN Outcome: Adequate for Discharge   Problem: Elimination: Goal: Will not experience complications related to bowel motility 02/20/2021 1313 by Conard Novak, RN Outcome: Adequate for Discharge 02/20/2021 1313 by Conard Novak, RN Outcome: Adequate for Discharge Goal: Will not experience complications related to urinary retention 02/20/2021 1313 by Conard Novak, RN Outcome: Adequate for Discharge 02/20/2021 1313 by Conard Novak, RN Outcome: Adequate for Discharge   Problem: Pain Managment: Goal: General experience of comfort will improve 02/20/2021 1313 by Conard Novak, RN Outcome: Adequate for Discharge 02/20/2021 1313 by Conard Novak, RN Outcome: Adequate for Discharge   Problem: Safety: Goal: Ability to remain free from injury will improve 02/20/2021 1313 by Conard Novak, RN Outcome: Adequate for Discharge 02/20/2021 1313 by Conard Novak, RN Outcome: Adequate for Discharge   Problem: Skin Integrity: Goal: Risk for impaired skin integrity will decrease 02/20/2021 1313 by Conard Novak, RN Outcome: Adequate for Discharge 02/20/2021 1313 by Conard Novak, RN Outcome: Adequate for Discharge   Problem: Acute Rehab PT Goals(only PT should resolve) Goal: Pt Will Go Supine/Side To Sit Outcome: Adequate for Discharge Goal: Pt Will Transfer Bed To Chair/Chair To Bed Outcome: Adequate for Discharge Goal: Pt Will Ambulate Outcome: Adequate for Discharge Goal: Pt Will Verbalize and Adhere to Precautions While Description: PT Will Verbalize and Adhere to Precautions While Performing Mobility Outcome: Adequate for Discharge Goal: Pt/caregiver will  Perform Home Exercise Program Outcome: Adequate for Discharge   Problem: Acute Rehab OT Goals (only OT should resolve) Goal: Pt. Will Perform Grooming Outcome: Adequate for Discharge Goal: Pt. Will Perform Lower Body  Dressing Outcome: Adequate for Discharge Goal: Pt. Will Transfer To Toilet Outcome: Adequate for Discharge Goal: Pt. Will Perform Toileting-Clothing Manipulation Outcome: Adequate for Discharge Goal: Pt/Caregiver Will Perform Home Exercise Program Outcome: Adequate for Discharge   Problem: Acute Rehab OT Goals (only OT should resolve) Goal: OT Additional ADL Goal #1 Outcome: Adequate for Discharge

## 2021-02-20 NOTE — Discharge Instructions (Signed)
F/u Neurosurgery Dr Trenton Gammon on your scheduled upcoming appt

## 2021-02-20 NOTE — Progress Notes (Signed)
Physical Therapy Treatment Patient Details Name: [name] MRN: [id number] DOB: [birth year] Today's Date: 02/20/2021    History of Present Illness Pt is a 75 y/o F admitted from home with c/c of AMS. Pt with revision & extension of L5-S1 PLIF with L4-S1 on 02/09/21 by Dr. Annette Stable at [facility] d/c home with [facility] f/u. Pt being treated for acute metabolic encephalopathy of unclear etiology. PMH: HTN, DM2, hypothyroidism, anxiety, arthritis, CHF, CKD, chronic pain syndrome, depression, GERD, HLD, IBS, obesity, osteoporosis, panic attacks, seizures, sleep apnea    PT Comments    Pt was awake, long sitting in bed upon arriving. She agrees to OOB activity and is A and O x 4. Does have some cognitive deficits come to light during session however able to consistently follow commands and perform all desired task. Pt is incontinent of urine. She was able to log roll L to short sit form flat bed position however heavy use of bed rail to perform. HR at rest 96 bpm. Sat EOB x several minutes prior to standing and ambulating. LSO applied in sitting. She required max assist to properly apply. Pt stood to RW with CGA and ambulated 20 ft in rm without LOB. HR elevated to 135 bpm with minimal activity. Pt unwilling to ambulate further distances. At conclusion of session, pt was sitting in recliner with call bell in reach, chair alarm in place, and pt requesting leaving LSO brace donned. Pt will continue to benefit form SNF at DC to address deficits while maximizing independence. Currently pt is unwilling to DC to rehab and requesting to DC home with daughter's assistance.    Follow Up Recommendations  Supervision/Assistance - 24 hour;SNF;Other (comment) (pt states she does not want to go to SNF and that daughter will assist her at home at DC.)     Equipment Recommendations  Rolling walker with 5" wheels;3in1 (PT)       Precautions / Restrictions Precautions Precautions: Fall;Back Precaution Booklet Issued: Yes  (comment) Precaution Comments: pt unable to recall any precautions Required Braces or Orthoses: Spinal Brace Spinal Brace: Lumbar corset;Applied in sitting position (quickdraw LSO) Restrictions Weight Bearing Restrictions: No    Mobility  Bed Mobility Overal bed mobility: Needs Assistance Bed Mobility: Rolling;Sidelying to Sit Rolling: Supervision Sidelying to sit: Supervision       General bed mobility comments: Increased time to perform with vcs for technique and sequencing. was able to perform from flat bed position however heavy reliance on bed rail    Transfers Overall transfer level: Needs assistance Equipment used: Rolling walker (2 wheeled) Transfers: Sit to/from Stand Sit to Stand: Min guard         General transfer comment: CGA to stand from L side of bed. gait belt for safety however unused. Max assist to apply LSO quickdraw while seated EOB.  Ambulation/Gait Ambulation/Gait assistance: Min guard Gait Distance (Feet): 20 Feet Assistive device: Rolling walker (2 wheeled) Gait Pattern/deviations: Step-through pattern Gait velocity: decreased   General Gait Details: Pt was able to ambulate 20 ft with RW with CGA only. She is incontinent of urine and unwilling to ambulate greater distances. HR elevated to 135 with minimal standing activity.       Balance Overall balance assessment: Needs assistance Sitting-balance support: Feet supported;No upper extremity supported Sitting balance-Leahy Scale: Good     Standing balance support: Bilateral upper extremity supported;During functional activity Standing balance-Leahy Scale: Fair Standing balance comment: BUE support on RW in standing/gait  Cognition Arousal/Alertness: Awake/alert Behavior During Therapy: WFL for tasks assessed/performed Overall Cognitive Status: No family/caregiver present to determine baseline cognitive functioning      Orientation Level: Disoriented to;Situation Current Attention  Level: Focused           General Comments: Pt was alert and oriented however has poor insight of deficits with overall poor safety awareness. Did correctly answer all orientation questions appropriately         General Comments General comments (skin integrity, edema, etc.): Pt was sitting in recliner with LSO brace on and purwick replaced. call bell is in reach and chair alarm in place.      Pertinent Vitals/Pain Pain Assessment: No/denies pain Pain Score: 0-No pain Faces Pain Scale: Hurts a little bit Pain Location: back Pain Descriptors / Indicators: Sore Pain Intervention(s): Monitored during session;Limited activity within patient's tolerance;Repositioned           PT Goals (current goals can now be found in the care plan section) Acute Rehab PT Goals Patient Stated Goal: to go home Progress towards PT goals: Progressing toward goals    Frequency    Min 2X/week      PT Plan Current plan remains appropriate       AM-PAC PT "6 Clicks" Mobility   Outcome Measure  Help needed turning from your back to your side while in a flat bed without using bedrails?: A Little Help needed moving from lying on your back to sitting on the side of a flat bed without using bedrails?: A Little Help needed moving to and from a bed to a chair (including a wheelchair)?: A Little Help needed standing up from a chair using your arms (e.g., wheelchair or bedside chair)?: A Little Help needed to walk in hospital room?: A Little Help needed climbing 3-5 steps with a railing? : A Little 6 Click Score: 18    End of Session Equipment Utilized During Treatment: Back brace Activity Tolerance: Patient tolerated treatment well Patient left: in chair;with call bell/phone within reach;with chair alarm set Nurse Communication: Mobility status;Precautions PT Visit Diagnosis: Unsteadiness on feet (R26.81);Muscle weakness (generalized) (M62.81);Pain     Time: 7902-4097 PT Time Calculation  (min) (ACUTE ONLY): 18 min  Charges:  $Therapeutic Activity: 8-22 mins                     Julaine Fusi PTA 02/20/21, 9:05 AM

## 2021-02-20 NOTE — Care Management Important Message (Signed)
Important Message  Patient Details  Name: [name] MRN: [id number] Date of Birth: 1946-04-19   Medicare Important Message Given:  Yes  Patient is in an isolation room so I talked with her by phone [phone]) and reviewed the Important Message from Medicare with her.  She was in agreement with the discharge plan and I asked if she would like a copy and she replied no. I thanked her for her time.  [name] 02/20/2021, 1:53 PM

## 2021-02-20 NOTE — Discharge Summary (Signed)
[facility] [place] NAME: [name]    MR#:  [id number]  DATE OF BIRTH:  1946-04-06  DATE OF ADMISSION:  02/15/2021 ADMITTING PHYSICIAN: Whitney Grayer, MD  DATE OF DISCHARGE: 02/20/2021  PRIMARY CARE PHYSICIAN: [name]    ADMISSION DIAGNOSIS:  Altered mental status, unspecified altered mental status type [R41.82] AMS (altered mental status) [F74.94] Acute metabolic encephalopathy [W96.75]  DISCHARGE DIAGNOSIS:   Acute metabolic encephalopathy suspect polypharmacy and acute renal failure/dehydration improve  SECONDARY DIAGNOSIS:   Past Medical History:  Diagnosis Date  . Amnesia   . Anxiety   . Arthritis   . Back pain   . CHF (congestive heart failure) ([place])   . Chronic kidney disease    Acute on chronic 10/2020  . Chronic pain syndrome   . Cystocele   . Depression   . Diabetes mellitus without complication ([place])    Type II  . GERD (gastroesophageal reflux disease)   . History of blood transfusion    with knee placements  . Hyperlipidemia   . Hypertension   . IBS (irritable bowel syndrome)   . Obesity   . Osteoporosis   . Osteoporosis   . Panic attacks   . Seizure ([place])   . Shortness of breath dyspnea   . Sleep apnea    not using now  . Thyroid nodule     HOSPITAL COURSE:  [name] is a 75 y.o. female with medical history significant for recent admission for lumbar surgery by Dr. Annette [name] on 02/09/2021, she was discharged to home with home health PT OT.  Her pain was well controlled at the time with oral pain medications, percocet.  She also has a history of essential hypertension, type 2 diabetes, hypothyroidism.  She presented today from home due to confusion, talking out of her head, and repeating her words x 2 days.  # Acute metabolic encephalopathy with word finding problems. --MRI of the brain negative for acute stroke. Likely acute metabolic encephalopathy secondary to dehydration/ acute  kidney injury and polypharmacy -- Resumed most of her home meds.-- Patient advised not to take sedating meds at a time. Mentation appears at baseline. --Continue supportive care --Acute encephalopathy seems to be resolving, patient is AOx3 today.  # Acute kidney injury on chronic kidney disease stage IIIa. Creatinine 1.82 on presentation and down to 1.20 --Creatinine improving  # Recent back surgery. Physical therapy evaluation but awaiting back brace. --Continue SCDs Neurosurgery Dr [name] saw pt-- patient's surgical site not losing anymore. Dry dressing according to RN today. -- aspirin can be resumed after seen by Dr. Trenton Gammon neurosurgery and if okay with him.  # Anemia, acute blood loss spec due to slow losing from surgical site --hemoglobin dropped down to 7.8. On prior to discharge was 10.3. --Discontinue aspirin and heparin subcu.  --Iron level 37, saturation 13%, slightly lower and, folate 15.0 --Vitamin B12 level 230  # Vitamin D insufficiency, started vitamin D supplement in the form of cal-vit d  # Depression -- continue psychiatric medications duloxetine, Viibryd, Seroquel and Topamax at night  # Hypothyroidism unspecified on levothyroxine  # Type 2 diabetes mellitus with hyperlipidemia on atorvastatin. -- Restart Lantus low dose 15 units at night. As per family does take 72 units at night. Last hemoglobin A1c 6.6.--pt advised to adjust insulin according to sugars at home --d/c metformin due to creat  # 7 mm lesion left eccentric canal at the craniocervical junction. Radiology recommended contrasted  MRI of the cervical spine to further characterize. With acute kidney injury I will hold off on ordering that at this time. --MRI cervical spine: 7 x 9 x 7 mm lobular avidly enhancing extra-axial dural-based mass within the left posterolateral aspect of the spinal canal at the C1 level. 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 This exerts local  mass effect upon the left dorsal lateral aspect of the spinal cord with mild spinal cord flattening. No underlying spinal cord signal abnormality. Neurosurgery Dr [name] recommended no intervention, lesion may be benign but is evidence needed as an outpatient.   Body mass index is 44.29 kg/m.   Interventions:       Diet: Soft diabetic diet DVT Prophylaxis: SCD, pharmacological prophylaxis contraindicated due to Bleeding from back due to recent surgery   Advance goals of care discussion: DNR  Family Communication: left message for dter [name] Disposition:  Pt is from Home lives with daughter and friend. Patient is requesting be discharged to home with home health resumption. Orders placed  Left message for daughter [name]  CONSULTS OBTAINED:  Treatment Team:  Whitney Maw, MD  DRUG ALLERGIES:  No Known Allergies  DISCHARGE MEDICATIONS:   Allergies as of 02/20/2021   No Known Allergies     Medication List    STOP taking these medications   aspirin EC 81 MG tablet   Belsomra 15 MG Tabs Generic drug: Suvorexant   cyclobenzaprine 10 MG tablet Commonly known as: FLEXERIL   metFORMIN 500 MG 24 hr tablet Commonly known as: GLUCOPHAGE-XR     TAKE these medications   atorvastatin 40 MG tablet Commonly known as: LIPITOR Take 40 mg by mouth daily.   busPIRone 30 MG tablet Commonly known as: BUSPAR Take 30 mg by mouth 2 (two) times daily.   DULoxetine 60 MG capsule Commonly known as: CYMBALTA Take 60 mg by mouth in the morning.   fluticasone 50 MCG/ACT nasal spray Commonly known as: FLONASE Place 1-2 sprays into both nostrils daily as needed for allergies or rhinitis.   gabapentin 300 MG capsule Commonly known as: NEURONTIN Take 1 capsule (300 mg total) by mouth 3 (three) times daily. What changed: when to take this   Lantus SoloStar 100 UNIT/ML Solostar Pen Generic drug: insulin glargine Inject 15 Units into the skin at bedtime. What changed:    how much to take  Another medication with the same name was removed. Continue taking this medication, and follow the directions you see here.   levothyroxine 75 MCG tablet Commonly known as: SYNTHROID Take 1 tablet (75 mcg total) by mouth daily before breakfast.   lisinopril 10 MG tablet Commonly known as: ZESTRIL Take 1 tablet (10 mg total) by mouth daily. What changed:   medication strength  how much to take   memantine 28 MG Cp24 24 hr capsule Commonly known as: NAMENDA XR Take 28 mg by mouth daily.   metoprolol succinate 50 MG 24 hr tablet Commonly known as: TOPROL-XL Take 50 mg by mouth daily. Take with or immediately following a meal.   montelukast 10 MG tablet Commonly known as: SINGULAIR Take 10 mg by mouth every evening.   nystatin powder Commonly known as: MYCOSTATIN/NYSTOP Apply 1 application topically 2 (two) times daily.   Oscal 500/200 D-3 500-200 MG-UNIT tablet Generic drug: calcium-vitamin D Take 1 tablet by mouth 2 (two) times daily.   oxybutynin 5 MG 24 hr tablet Commonly known as: DITROPAN-XL Take 5 mg by mouth daily.   oxyCODONE-acetaminophen 10-325  MG tablet Commonly known as: Percocet Take 1 tablet by mouth every 6 (six) hours as needed for pain.   pantoprazole 40 MG tablet Commonly known as: PROTONIX Take 40 mg by mouth daily.   QUEtiapine 300 MG tablet Commonly known as: SEROQUEL Take 150-300 mg by mouth at bedtime.   topiramate 200 MG tablet Commonly known as: TOPAMAX Take 200 mg by mouth at bedtime.   Viibryd 40 MG Tabs Generic drug: Vilazodone HCl Take 40 mg by mouth daily.            Discharge Care Instructions  (From admission, onward)         Start     Ordered   02/20/21 0000  Discharge wound care:       Comments: Change bandage as needed daily   02/20/21 1311          If you experience worsening of your admission symptoms, develop shortness of breath, life threatening emergency, suicidal or homicidal  thoughts you must seek medical attention immediately by calling 911 or calling your MD immediately  if symptoms less severe.  You Must read complete instructions/literature along with all the possible adverse reactions/side effects for all the Medicines you take and that have been prescribed to you. Take any new Medicines after you have completely understood and accept all the possible adverse reactions/side effects.   Please note  You were cared for by a hospitalist during your hospital stay. If you have any questions about your discharge medications or the care you received while you were in the hospital after you are discharged, you can call the unit and asked to speak with the hospitalist on call if the hospitalist that took care of you is not available. Once you are discharged, your primary care physician will handle any further medical issues. Please note that NO REFILLS for any discharge medications will be authorized once you are discharged, as it is imperative that you return to your primary care physician (or establish a relationship with a primary care physician if you do not have one) for your aftercare needs so that they can reassess your need for medications and monitor your lab values. Today   SUBJECTIVE   I really want to go home. I'm doing so much better  VITAL SIGNS:  Blood pressure (!) 142/64, pulse 89, temperature 99.1 F (37.3 C), resp. rate 18, height 5\' 3"  (1.6 m), weight 113.4 kg, SpO2 96 %.  I/O:    Intake/Output Summary (Last 24 hours) at 02/20/2021 1320 Last data filed at 02/20/2021 0532 Gross per 24 hour  Intake 50 ml  Output 500 ml  Net -450 ml    PHYSICAL EXAMINATION:  GENERAL:  75 y.o.-year-old patient lying in the bed with no acute distress. obese LUNGS: Normal breath sounds bilaterally, no wheezing, rales,rhonchi or crepitation. No use of accessory muscles of respiration.  CARDIOVASCULAR: S1, S2 normal. No murmurs, rubs, or gallops.  ABDOMEN: Soft,  non-tender, non-distended. Bowel sounds present. No organomegaly or mass. Dressing + lower midline. Back brace + EXTREMITIES: No pedal edema, cyanosis, or clubbing.  NEUROLOGIC: Cranial nerves II through XII are intact. Muscle strength 5/5 in all extremities. Sensation intact. Gait not checked.  PSYCHIATRIC: The patient is alert and oriented x 3.  SKIN: No obvious rash, lesion, or ulcer.   DATA REVIEW:   CBC  Recent Labs  Lab 02/20/21 0534  WBC 7.9  HGB 8.6*  HCT 26.6*  PLT 175    Chemistries  Recent Labs  Lab 02/16/21 0544 02/17/21 0523 02/20/21 0534  NA 141   < > 140  K 4.8   < > 3.6  CL 111   < > 111  CO2 21*   < > 21*  GLUCOSE 131*   < > 237*  BUN 28*   < > 15  CREATININE 1.19*   < > 1.20*  CALCIUM 8.9   < > 8.5*  MG 1.6*   < > 1.7  AST 23  --   --   ALT 15  --   --   ALKPHOS 60  --   --   BILITOT 1.0  --   --    < > = values in this interval not displayed.    Microbiology Results   Recent Results (from the past 240 hour(s))  Resp Panel by RT-PCR (Flu A&B, Covid) Nasopharyngeal Swab     Status: None   Collection Time: 02/15/21  4:56 PM   Specimen: Nasopharyngeal Swab; Nasopharyngeal(NP) swabs in vial transport medium  Result Value Ref Range Status   SARS Coronavirus 2 by RT PCR NEGATIVE NEGATIVE Final    Comment: (NOTE) SARS-CoV-2 target nucleic acids are NOT DETECTED.  The SARS-CoV-2 RNA is generally detectable in upper respiratory specimens during the acute phase of infection. The lowest concentration of SARS-CoV-2 viral copies this assay can detect is 138 copies/mL. A negative result does not preclude SARS-Cov-2 infection and should not be used as the sole basis for treatment or other patient management decisions. A negative result may occur with  improper specimen collection/handling, submission of specimen other than nasopharyngeal swab, presence of viral mutation(s) within the areas targeted by this assay, and inadequate number of  viral copies(<138 copies/mL). A negative result must be combined with clinical observations, patient history, and epidemiological information. The expected result is Negative.  Fact Sheet for Patients:  EntrepreneurPulse.com.au  Fact Sheet for Healthcare Providers:  IncredibleEmployment.be  This test is no t yet approved or cleared by the [place] FDA and  has been authorized for detection and/or diagnosis of SARS-CoV-2 by FDA under an Emergency Use Authorization (EUA). This EUA will remain  in effect (meaning this test can be used) for the duration of the COVID-19 declaration under Section 564(b)(1) of the Act, 21 U.S.C.section 360bbb-3(b)(1), unless the authorization is terminated  or revoked sooner.       Influenza A by PCR NEGATIVE NEGATIVE Final   Influenza B by PCR NEGATIVE NEGATIVE Final    Comment: (NOTE) The Xpert Xpress SARS-CoV-2/FLU/RSV plus assay is intended as an aid in the diagnosis of influenza from Nasopharyngeal swab specimens and should not be used as a sole basis for treatment. Nasal washings and aspirates are unacceptable for Xpert Xpress SARS-CoV-2/FLU/RSV testing.  Fact Sheet for Patients: EntrepreneurPulse.com.au  Fact Sheet for Healthcare Providers: IncredibleEmployment.be  This test is not yet approved or cleared by the [place] FDA and has been authorized for detection and/or diagnosis of SARS-CoV-2 by FDA under an Emergency Use Authorization (EUA). This EUA will remain in effect (meaning this test can be used) for the duration of the COVID-19 declaration under Section 564(b)(1) of the Act, 21 U.S.C. section 360bbb-3(b)(1), unless the authorization is terminated or revoked.  Performed at [facility], [address] [place], [place] [address]   CULTURE, BLOOD (ROUTINE X 2) w Reflex to ID Panel     Status: None   Collection Time: 02/15/21  7:13 PM    Specimen: BLOOD  Result Value Ref Range  Status   Specimen Description BLOOD BLOOD LEFT FOREARM  Final   Special Requests   Final    BOTTLES DRAWN AEROBIC ONLY Blood Culture results may not be optimal due to an inadequate volume of blood received in culture bottles   Culture   Final    NO GROWTH 5 DAYS Performed at [facility], [address] [place], [place] [address]    Report Status 02/20/2021 FINAL  Final  CULTURE, BLOOD (ROUTINE X 2) w Reflex to ID Panel     Status: None   Collection Time: 02/15/21  7:14 PM   Specimen: BLOOD  Result Value Ref Range Status   Specimen Description BLOOD BLOOD RIGHT HAND  Final   Special Requests   Final    BOTTLES DRAWN AEROBIC AND ANAEROBIC Blood Culture adequate volume   Culture   Final    NO GROWTH 5 DAYS Performed at [facility], [address] [place], [place] [address]    Report Status 02/20/2021 FINAL  Final    RADIOLOGY:  No results found.   CODE STATUS:     Code Status Orders  (From admission, onward)         Start     Ordered   02/15/21 1736  Do not attempt resuscitation (DNR)  Continuous       Question Answer Comment  In the event of cardiac or respiratory ARREST Do not call a "code blue"   In the event of cardiac or respiratory ARREST Do not perform Intubation, CPR, defibrillation or ACLS   In the event of cardiac or respiratory ARREST Use medication by any route, position, wound care, and other measures to relive pain and suffering. May use oxygen, suction and manual treatment of airway obstruction as needed for comfort.      02/15/21 1736        Code Status History    Date Active Date Inactive Code Status Order ID Comments User Context   02/15/2021 1559 02/15/2021 1736 Full Code [id number]  Kayleen Memos, DO ED   02/09/2021 1126 02/12/2021 2134 Full Code [id number]  Earnie Larsson, MD Inpatient   06/12/2020 1640 06/16/2020 1954 DNR [id number]  Collier Bullock, MD ED   06/12/2020 1543 06/12/2020 1640 Full Code  [id number]  Collier Bullock, MD ED   03/06/2020 1304 03/08/2020 2002 DNR [id number]  Collier Bullock, MD ED   02/06/2020 0118 02/07/2020 2045 DNR [id number]  Athena Masse, MD ED   05/05/2018 1402 05/06/2018 1414 Full Code [id number]  Isaias Cowman, MD Inpatient   Advance Care Planning Activity       TOTAL TIME TAKING CARE OF THIS PATIENT: *40* minutes.    Fritzi Mandes M.D  [facility]    CC: Primary care physician; [place]

## 2021-02-22 DIAGNOSIS — Z981 Arthrodesis status: Secondary | ICD-10-CM | POA: Insufficient documentation

## 2021-03-19 ENCOUNTER — Encounter: Payer: Self-pay | Admitting: Urology

## 2021-03-19 ENCOUNTER — Ambulatory Visit (INDEPENDENT_AMBULATORY_CARE_PROVIDER_SITE_OTHER): Payer: Medicare Other | Admitting: Urology

## 2021-03-19 ENCOUNTER — Other Ambulatory Visit: Payer: Self-pay

## 2021-03-19 VITALS — BP 134/74 | HR 68 | Ht 63.0 in

## 2021-03-19 DIAGNOSIS — N3020 Other chronic cystitis without hematuria: Secondary | ICD-10-CM | POA: Diagnosis not present

## 2021-03-19 DIAGNOSIS — N3281 Overactive bladder: Secondary | ICD-10-CM | POA: Diagnosis not present

## 2021-03-19 DIAGNOSIS — N3946 Mixed incontinence: Secondary | ICD-10-CM | POA: Diagnosis not present

## 2021-03-19 LAB — BLADDER SCAN AMB NON-IMAGING: Scan Result: 29

## 2021-03-19 MED ORDER — TRIMETHOPRIM 100 MG PO TABS: 100.0000 mg | ORAL_TABLET | Freq: Every day | ORAL | 11 refills | Status: DC

## 2021-03-19 NOTE — Patient Instructions (Signed)
Cystoscopy Cystoscopy is a procedure that is used to help diagnose and sometimes treat conditions that affect the lower urinary tract. The lower urinary tract includes the bladder and the urethra. The urethra is the tube that drains urine from the bladder. Cystoscopy is done using a thin, tube-shaped instrument with a light and camera at the end (cystoscope). The cystoscope may be hard or flexible, depending on the goal of the procedure. The cystoscope is inserted through the urethra, into the bladder. Cystoscopy may be recommended if you have:  Urinary tract infections that keep coming back.  Blood in the urine (hematuria).  An inability to control when you urinate (urinary incontinence) or an overactive bladder.  Unusual cells found in a urine sample.  A blockage in the urethra, such as a urinary stone.  Painful urination.  An abnormality in the bladder found during an intravenous pyelogram (IVP) or CT scan. Cystoscopy may also be done to remove a sample of tissue to be examined under a microscope (biopsy). What are the risks? Generally, this is a safe procedure. However, problems may occur, including:  Infection.  Bleeding.  What happens during the procedure?  1. You will be given one or more of the following: ? A medicine to numb the area (local anesthetic). 2. The area around the opening of your urethra will be cleaned. 3. The cystoscope will be passed through your urethra into your bladder. 4. Germ-free (sterile) fluid will flow through the cystoscope to fill your bladder. The fluid will stretch your bladder so that your health care provider can clearly examine your bladder walls. 5. Your doctor will look at the urethra and bladder. 6. The cystoscope will be removed The procedure may vary among health care providers  What can I expect after the procedure? After the procedure, it is common to have: 1. Some soreness or pain in your abdomen and urethra. 2. Urinary symptoms.  These include: ? Mild pain or burning when you urinate. Pain should stop within a few minutes after you urinate. This may last for up to 1 week. ? A small amount of blood in your urine for several days. ? Feeling like you need to urinate but producing only a small amount of urine. Follow these instructions at home: General instructions  Return to your normal activities as told by your health care provider.   Do not drive for 24 hours if you were given a sedative during your procedure.  Watch for any blood in your urine. If the amount of blood in your urine increases, call your health care provider.  If a tissue sample was removed for testing (biopsy) during your procedure, it is up to you to get your test results. Ask your health care provider, or the department that is doing the test, when your results will be ready.  Drink enough fluid to keep your urine pale yellow.  Keep all follow-up visits as told by your health care provider. This is important. Contact a health care provider if you:  Have pain that gets worse or does not get better with medicine, especially pain when you urinate.  Have trouble urinating.  Have more blood in your urine. Get help right away if you:  Have blood clots in your urine.  Have abdominal pain.  Have a fever or chills.  Are unable to urinate. Summary  Cystoscopy is a procedure that is used to help diagnose and sometimes treat conditions that affect the lower urinary tract.  Cystoscopy is done using   a thin, tube-shaped instrument with a light and camera at the end.  After the procedure, it is common to have some soreness or pain in your abdomen and urethra.  Watch for any blood in your urine. If the amount of blood in your urine increases, call your health care provider.  If you were prescribed an antibiotic medicine, take it as told by your health care provider. Do not stop taking the antibiotic even if you start to feel better. This  information is not intended to replace advice given to you by your health care provider. Make sure you discuss any questions you have with your health care provider. Document Revised: 10/13/2018 Document Reviewed: 10/13/2018 Elsevier Patient Education  2020 [employer]   

## 2021-03-19 NOTE — Progress Notes (Signed)
03/19/2021 2:56 PM   [name] 11-Apr-1946 [id number]  Referring provider: Lavonia Dana, MD [address] [place],  [place] [address]  Chief Complaint  Patient presents with  . 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  She leaks with coughing sneezing bending lifting.  She has urge incontinence.  No bedwetting.  She can soak 6 pads a day.  Urine runs down her leg when she stands.  She can void every 1-3 hours and goes hourly at night.  She has ankle edema  Flow was reasonable and she does not necessarily feel empty.  Sometimes she leans forward to try to empty better  She thinks she gets 10 infections a year.  Sometimes she gets confused.  Urine gets foul-smelling.  Positive cultures in medical record  No previous bladder surgery kidney stone   PMH: Past Medical History:  Diagnosis Date  . Amnesia   . Anxiety   . Arthritis   . Back pain   . CHF (congestive heart failure) ([place])   . Chronic kidney disease    Acute on chronic 10/2020  . Chronic pain syndrome   . Cystocele   . Depression   . Diabetes mellitus without complication ([place])    Type II  . GERD (gastroesophageal reflux disease)   . History of blood transfusion    with knee placements  . Hyperlipidemia   . Hypertension   . IBS (irritable bowel syndrome)   . Obesity   . Osteoporosis   . Osteoporosis   . Panic attacks   . Seizure ([place])   . Shortness of breath dyspnea   . Sleep apnea    not using now  . Thyroid nodule     Surgical History: Past Surgical History:  Procedure Laterality Date  . ABDOMINAL HYSTERECTOMY    . APPENDECTOMY    . CARDIAC CATHETERIZATION    . CHOLECYSTECTOMY    . CORONARY STENT INTERVENTION N/A 05/05/2018   Procedure: CORONARY STENT INTERVENTION;  Surgeon: Isaias Cowman, MD;  Location: [facility];  Service: Cardiovascular;  Laterality: N/A;  .  FOOT SURGERY    . LEFT HEART CATH AND CORONARY ANGIOGRAPHY Left 05/05/2018   Procedure: LEFT HEART CATH AND CORONARY ANGIOGRAPHY;  Surgeon: Isaias Cowman, MD;  Location: [facility];  Service: Cardiovascular;  Laterality: Left;  . OOPHORECTOMY    . REPLACEMENT TOTAL KNEE    . TONSILLECTOMY      Home Medications:  Allergies as of 03/19/2021   No Known Allergies     Medication List       Accurate as of Mar 19, 2021  2:56 PM. If you have any questions, ask your nurse or doctor.        atorvastatin 40 MG tablet Commonly known as: LIPITOR Take 40 mg by mouth daily.   Belsomra 15 MG Tabs Generic drug: Suvorexant   busPIRone 30 MG tablet Commonly known as: BUSPAR Take 30 mg by mouth 2 (two) times daily.   DULoxetine 60 MG capsule Commonly known as: CYMBALTA Take 60 mg by mouth in the morning.   fluticasone 50 MCG/ACT nasal spray Commonly known as: FLONASE Place 1-2 sprays into both nostrils daily as needed for allergies or rhinitis.   gabapentin 300 MG capsule Commonly known as: NEURONTIN Take 1 capsule (300 mg total) by mouth 3 (three) times daily. What changed: when to take this  Lantus SoloStar 100 UNIT/ML Solostar Pen Generic drug: insulin glargine Inject 15 Units into the skin at bedtime.   levothyroxine 75 MCG tablet Commonly known as: SYNTHROID Take 1 tablet (75 mcg total) by mouth daily before breakfast.   lisinopril 10 MG tablet Commonly known as: ZESTRIL Take 1 tablet (10 mg total) by mouth daily.   memantine 28 MG Cp24 24 hr capsule Commonly known as: NAMENDA XR Take 28 mg by mouth daily.   metoprolol succinate 50 MG 24 hr tablet Commonly known as: TOPROL-XL Take 50 mg by mouth daily. Take with or immediately following a meal.   montelukast 10 MG tablet Commonly known as: SINGULAIR Take 10 mg by mouth every evening.   nystatin powder Commonly known as: MYCOSTATIN/NYSTOP Apply 1 application topically 2 (two) times daily.   Oscal  500/200 D-3 500-200 MG-UNIT tablet Generic drug: calcium-vitamin D Take 1 tablet by mouth 2 (two) times daily.   oxybutynin 5 MG 24 hr tablet Commonly known as: DITROPAN-XL Take 5 mg by mouth daily.   oxyCODONE-acetaminophen 10-325 MG tablet Commonly known as: Percocet Take 1 tablet by mouth every 6 (six) hours as needed for pain.   pantoprazole 40 MG tablet Commonly known as: PROTONIX Take 40 mg by mouth daily.   QUEtiapine 300 MG tablet Commonly known as: SEROQUEL Take 150-300 mg by mouth at bedtime.   topiramate 200 MG tablet Commonly known as: TOPAMAX Take 200 mg by mouth at bedtime.   Viibryd 40 MG Tabs Generic drug: Vilazodone HCl Take 40 mg by mouth daily.       Allergies: No Known Allergies  Family History: Family History  Problem Relation Age of Onset  . Cancer Mother   . Cancer Father   . Cancer Sister   . Thyroid disease Neg Hx     Social History:  reports that she has never smoked. She has never used smokeless tobacco. She reports that she does not drink alcohol and does not use drugs.  ROS:                                        Physical Exam: BP 134/74   Pulse 68   Ht 5\' 3"  (1.6 m)   BMI 44.29 kg/m   Constitutional:  Alert and oriented, No acute distress. HEENT: [place] AT, moist mucus membranes.  Trachea midline, no masses. Cardiovascular: No clubbing, cyanosis, or edema. Respiratory: Normal respiratory effort, no increased work of breathing. GI: Abdomen is soft, nontender, nondistended, no abdominal masses GU: No CVA tenderness.  Alert sitting in wheelchair Skin: No rashes, bruises or suspicious lesions. Lymph: No cervical or inguinal adenopathy. Neurologic: Grossly intact, no focal deficits, moving all 4 extremities. Psychiatric: Normal mood and affect.  Laboratory Data: Lab Results  Component Value Date   WBC 7.9 02/20/2021   HGB 8.6 (L) 02/20/2021   HCT 26.6 (L) 02/20/2021   MCV 92.0 02/20/2021   PLT 175  02/20/2021    Lab Results  Component Value Date   CREATININE 1.20 (H) 02/20/2021    No results found for: PSA  No results found for: TESTOSTERONE  Lab Results  Component Value Date   HGBA1C 6.6 (H) 02/09/2021    Urinalysis    Component Value Date/Time   COLORURINE YELLOW (A) 02/15/2021 1057   APPEARANCEUR CLEAR (A) 02/15/2021 1057   APPEARANCEUR Cloudy (A) 07/26/2016 1119   LABSPEC 1.010 02/15/2021 1057  LABSPEC 1.024 01/30/2014 0026   PHURINE 5.0 02/15/2021 1057   GLUCOSEU NEGATIVE 02/15/2021 1057   GLUCOSEU >=500 01/30/2014 0026   HGBUR SMALL (A) 02/15/2021 1057   BILIRUBINUR NEGATIVE 02/15/2021 1057   BILIRUBINUR Negative 07/26/2016 1119   BILIRUBINUR Negative 01/30/2014 0026   KETONESUR NEGATIVE 02/15/2021 1057   PROTEINUR NEGATIVE 02/15/2021 1057   UROBILINOGEN 1.0 07/11/2008 1245   NITRITE NEGATIVE 02/15/2021 1057   LEUKOCYTESUR NEGATIVE 02/15/2021 1057   LEUKOCYTESUR Trace 01/30/2014 0026    Pertinent Imaging: Urine positive.  Urine sent for culture.  Chart reviewed  Assessment & Plan: Patient has mixed incontinence.  She has recurrent bladder infections.  I put her on prophylaxis today with trimethoprim 100 mg 3x11.  Reassess incontinence in 7 weeks to see if it down regulates.  Perform cystoscopy on that day.  Patient had normal renal ultrasound less than 1 year ago.  Perform pelvic examination on that day.  1. OAB (overactive bladder)  - Urinalysis, Complete - Bladder Scan (Post Void Residual) in office   No follow-ups on file.  Reece Packer, MD  [facility] [address], [place],  [address] [phone]

## 2021-03-22 LAB — URINALYSIS, COMPLETE
Bilirubin, UA: NEGATIVE
Ketones, UA: NEGATIVE
Leukocytes,UA: NEGATIVE
Nitrite, UA: NEGATIVE
Protein,UA: NEGATIVE
Specific Gravity, UA: 1.025 (ref 1.005–1.030)
Urobilinogen, Ur: 0.2 mg/dL (ref 0.2–1.0)
pH, UA: 6 (ref 5.0–7.5)

## 2021-03-22 LAB — MICROSCOPIC EXAMINATION

## 2021-03-26 ENCOUNTER — Telehealth: Payer: Self-pay

## 2021-03-26 LAB — CULTURE, URINE COMPREHENSIVE

## 2021-03-26 MED ORDER — NITROFURANTOIN MACROCRYSTAL 100 MG PO CAPS: 100.0000 mg | ORAL_CAPSULE | Freq: Two times a day (BID) | ORAL | 0 refills | Status: DC

## 2021-03-26 NOTE — Telephone Encounter (Signed)
-----   Message from Jessica L Qualls, CMA sent at 03/26/2021  3:08 PM EDT -----  ----- Message ----- From: MacDiarmid, Scott, MD Sent: 03/26/2021   3:08 PM EDT To: Jessica L Qualls, CMA  Please make sure I have not already treated this  Hycodan 100 mg twice a day for 7 days; then go back on daily trimethoprim  ----- Message ----- From: Qualls, Jessica L, CMA Sent: 03/26/2021   7:24 AM EDT To: Scott MacDiarmid, MD   ----- Message ----- From: Interface, [facility] Lab Results In Sent: 03/21/2021   7:43 AM EDT To: [facility]     

## 2021-03-26 NOTE — Telephone Encounter (Signed)
-----   Message from Chrystie Nose, [place] sent at 03/26/2021  3:08 PM EDT -----  ----- Message ----- From: Bjorn Loser, MD Sent: 03/26/2021   3:08 PM EDT To: Chrystie Nose, CMA  Please make sure I have not already treated this  Hycodan 100 mg twice a day for 7 days; then go back on daily trimethoprim  ----- Message ----- From: Chrystie Nose, CMA Sent: 03/26/2021   7:24 AM EDT To: Bjorn Loser, MD   ----- Message ----- From: Interface, [facility] Lab Results In Sent: 03/21/2021   7:43 AM EDT To: [facility]

## 2021-03-26 NOTE — Telephone Encounter (Signed)
Left message with gentleman for patient to call our office

## 2021-03-26 NOTE — Telephone Encounter (Signed)
Clarified with dr Matilde Sprang to call in macrodantin 100 mg BID x 7 days. Called in rx. Left message for pt to return call

## 2021-03-29 ENCOUNTER — Emergency Department: Payer: Medicare Other

## 2021-03-29 ENCOUNTER — Emergency Department
Admission: EM | Admit: 2021-03-29 | Discharge: 2021-03-29 | Disposition: A | Discharge status: Other Acute Inpatient Hospital | Payer: Medicare Other | Attending: Emergency Medicine | Admitting: Emergency Medicine

## 2021-03-29 ENCOUNTER — Inpatient Hospital Stay (HOSPITAL_COMMUNITY)
Admission: EM | Admit: 2021-03-29 | Discharge: 2021-04-04 | DRG: 552 | Disposition: A | Discharge status: Home Health | Payer: Medicare Other | Source: Other Acute Inpatient Hospital | Attending: Neurological Surgery | Admitting: Neurological Surgery

## 2021-03-29 ENCOUNTER — Encounter: Payer: Self-pay | Admitting: Emergency Medicine

## 2021-03-29 ENCOUNTER — Other Ambulatory Visit: Payer: Self-pay

## 2021-03-29 DIAGNOSIS — Z20822 Contact with and (suspected) exposure to covid-19: Secondary | ICD-10-CM | POA: Insufficient documentation

## 2021-03-29 DIAGNOSIS — W1830XA Fall on same level, unspecified, initial encounter: Secondary | ICD-10-CM | POA: Diagnosis present

## 2021-03-29 DIAGNOSIS — Z79890 Hormone replacement therapy (postmenopausal): Secondary | ICD-10-CM

## 2021-03-29 DIAGNOSIS — Z96651 Presence of right artificial knee joint: Secondary | ICD-10-CM | POA: Diagnosis present

## 2021-03-29 DIAGNOSIS — Z23 Encounter for immunization: Secondary | ICD-10-CM | POA: Insufficient documentation

## 2021-03-29 DIAGNOSIS — G894 Chronic pain syndrome: Secondary | ICD-10-CM | POA: Diagnosis present

## 2021-03-29 DIAGNOSIS — S22029A Unspecified fracture of second thoracic vertebra, initial encounter for closed fracture: Secondary | ICD-10-CM

## 2021-03-29 DIAGNOSIS — Z955 Presence of coronary angioplasty implant and graft: Secondary | ICD-10-CM

## 2021-03-29 DIAGNOSIS — N189 Chronic kidney disease, unspecified: Secondary | ICD-10-CM | POA: Diagnosis not present

## 2021-03-29 DIAGNOSIS — I509 Heart failure, unspecified: Secondary | ICD-10-CM | POA: Diagnosis not present

## 2021-03-29 DIAGNOSIS — E785 Hyperlipidemia, unspecified: Secondary | ICD-10-CM | POA: Diagnosis present

## 2021-03-29 DIAGNOSIS — S22089A Unspecified fracture of T11-T12 vertebra, initial encounter for closed fracture: Secondary | ICD-10-CM | POA: Insufficient documentation

## 2021-03-29 DIAGNOSIS — G4733 Obstructive sleep apnea (adult) (pediatric): Secondary | ICD-10-CM | POA: Diagnosis present

## 2021-03-29 DIAGNOSIS — I10 Essential (primary) hypertension: Secondary | ICD-10-CM | POA: Diagnosis present

## 2021-03-29 DIAGNOSIS — Y92017 Garden or yard in single-family (private) house as the place of occurrence of the external cause: Secondary | ICD-10-CM

## 2021-03-29 DIAGNOSIS — S0990XA Unspecified injury of head, initial encounter: Secondary | ICD-10-CM | POA: Diagnosis not present

## 2021-03-29 DIAGNOSIS — R109 Unspecified abdominal pain: Secondary | ICD-10-CM | POA: Insufficient documentation

## 2021-03-29 DIAGNOSIS — Z6841 Body Mass Index (BMI) 40.0 and over, adult: Secondary | ICD-10-CM | POA: Diagnosis not present

## 2021-03-29 DIAGNOSIS — I2510 Atherosclerotic heart disease of native coronary artery without angina pectoris: Secondary | ICD-10-CM | POA: Insufficient documentation

## 2021-03-29 DIAGNOSIS — E1169 Type 2 diabetes mellitus with other specified complication: Secondary | ICD-10-CM | POA: Diagnosis not present

## 2021-03-29 DIAGNOSIS — Z794 Long term (current) use of insulin: Secondary | ICD-10-CM | POA: Diagnosis not present

## 2021-03-29 DIAGNOSIS — E039 Hypothyroidism, unspecified: Secondary | ICD-10-CM | POA: Insufficient documentation

## 2021-03-29 DIAGNOSIS — I130 Hypertensive heart and chronic kidney disease with heart failure and stage 1 through stage 4 chronic kidney disease, or unspecified chronic kidney disease: Secondary | ICD-10-CM | POA: Insufficient documentation

## 2021-03-29 DIAGNOSIS — M810 Age-related osteoporosis without current pathological fracture: Secondary | ICD-10-CM | POA: Diagnosis present

## 2021-03-29 DIAGNOSIS — Z79899 Other long term (current) drug therapy: Secondary | ICD-10-CM

## 2021-03-29 DIAGNOSIS — Z809 Family history of malignant neoplasm, unspecified: Secondary | ICD-10-CM | POA: Diagnosis not present

## 2021-03-29 DIAGNOSIS — W06XXXA Fall from bed, initial encounter: Secondary | ICD-10-CM | POA: Diagnosis not present

## 2021-03-29 DIAGNOSIS — E1122 Type 2 diabetes mellitus with diabetic chronic kidney disease: Secondary | ICD-10-CM | POA: Diagnosis not present

## 2021-03-29 DIAGNOSIS — Z981 Arthrodesis status: Secondary | ICD-10-CM

## 2021-03-29 DIAGNOSIS — W19XXXA Unspecified fall, initial encounter: Secondary | ICD-10-CM

## 2021-03-29 DIAGNOSIS — T1490XA Injury, unspecified, initial encounter: Secondary | ICD-10-CM | POA: Diagnosis present

## 2021-03-29 DIAGNOSIS — T148XXA Other injury of unspecified body region, initial encounter: Secondary | ICD-10-CM | POA: Diagnosis present

## 2021-03-29 DIAGNOSIS — K219 Gastro-esophageal reflux disease without esophagitis: Secondary | ICD-10-CM | POA: Diagnosis present

## 2021-03-29 DIAGNOSIS — M549 Dorsalgia, unspecified: Secondary | ICD-10-CM | POA: Diagnosis present

## 2021-03-29 LAB — CBC WITH DIFFERENTIAL/PLATELET
Abs Immature Granulocytes: 0.1 10*3/uL — ABNORMAL HIGH (ref 0.00–0.07)
Basophils Absolute: 0.1 10*3/uL (ref 0.0–0.1)
Basophils Relative: 1 %
Eosinophils Absolute: 0.8 10*3/uL — ABNORMAL HIGH (ref 0.0–0.5)
Eosinophils Relative: 7 %
HCT: 31.8 % — ABNORMAL LOW (ref 36.0–46.0)
Hemoglobin: 9.7 g/dL — ABNORMAL LOW (ref 12.0–15.0)
Immature Granulocytes: 1 %
Lymphocytes Relative: 17 %
Lymphs Abs: 1.9 10*3/uL (ref 0.7–4.0)
MCH: 29.3 pg (ref 26.0–34.0)
MCHC: 30.5 g/dL (ref 30.0–36.0)
MCV: 96.1 fL (ref 80.0–100.0)
Monocytes Absolute: 1.1 10*3/uL — ABNORMAL HIGH (ref 0.1–1.0)
Monocytes Relative: 9 %
Neutro Abs: 7.5 10*3/uL (ref 1.7–7.7)
Neutrophils Relative %: 65 %
Platelets: 144 10*3/uL — ABNORMAL LOW (ref 150–400)
RBC: 3.31 MIL/uL — ABNORMAL LOW (ref 3.87–5.11)
RDW: 15.9 % — ABNORMAL HIGH (ref 11.5–15.5)
WBC: 11.5 10*3/uL — ABNORMAL HIGH (ref 4.0–10.5)
nRBC: 0 % (ref 0.0–0.2)

## 2021-03-29 LAB — COMPREHENSIVE METABOLIC PANEL
ALT: 23 U/L (ref 0–44)
AST: 32 U/L (ref 15–41)
Albumin: 3.5 g/dL (ref 3.5–5.0)
Alkaline Phosphatase: 107 U/L (ref 38–126)
Anion gap: 8 (ref 5–15)
BUN: 24 mg/dL — ABNORMAL HIGH (ref 8–23)
CO2: 20 mmol/L — ABNORMAL LOW (ref 22–32)
Calcium: 8.8 mg/dL — ABNORMAL LOW (ref 8.9–10.3)
Chloride: 111 mmol/L (ref 98–111)
Creatinine, Ser: 1.28 mg/dL — ABNORMAL HIGH (ref 0.44–1.00)
GFR, Estimated: 44 mL/min — ABNORMAL LOW (ref >60)
Glucose, Bld: 226 mg/dL — ABNORMAL HIGH (ref 70–99)
Potassium: 4.3 mmol/L (ref 3.5–5.1)
Sodium: 139 mmol/L (ref 135–145)
Total Bilirubin: 1 mg/dL (ref 0.3–1.2)
Total Protein: 6.7 g/dL (ref 6.5–8.1)

## 2021-03-29 LAB — MRSA PCR SCREENING: MRSA by PCR: NEGATIVE

## 2021-03-29 LAB — BASIC METABOLIC PANEL
Anion gap: 6 (ref 5–15)
BUN: 24 mg/dL — ABNORMAL HIGH (ref 8–23)
CO2: 21 mmol/L — ABNORMAL LOW (ref 22–32)
Calcium: 8.5 mg/dL — ABNORMAL LOW (ref 8.9–10.3)
Chloride: 113 mmol/L — ABNORMAL HIGH (ref 98–111)
Creatinine, Ser: 1.2 mg/dL — ABNORMAL HIGH (ref 0.44–1.00)
GFR, Estimated: 48 mL/min — ABNORMAL LOW (ref >60)
Glucose, Bld: 170 mg/dL — ABNORMAL HIGH (ref 70–99)
Potassium: 4.2 mmol/L (ref 3.5–5.1)
Sodium: 140 mmol/L (ref 135–145)

## 2021-03-29 LAB — CBG MONITORING, ED: Glucose-Capillary: 218 mg/dL — ABNORMAL HIGH (ref 70–99)

## 2021-03-29 LAB — RESP PANEL BY RT-PCR (FLU A&B, COVID) ARPGX2
Influenza A by PCR: NEGATIVE
Influenza B by PCR: NEGATIVE
SARS Coronavirus 2 by RT PCR: NEGATIVE

## 2021-03-29 LAB — SEDIMENTATION RATE: Sed Rate: 63 mm/hr — ABNORMAL HIGH (ref 0–30)

## 2021-03-29 LAB — TROPONIN I (HIGH SENSITIVITY)
Troponin I (High Sensitivity): 5 ng/L (ref <18)
Troponin I (High Sensitivity): 4 ng/L (ref <18)

## 2021-03-29 LAB — C-REACTIVE PROTEIN: CRP: 6.6 mg/dL — ABNORMAL HIGH (ref <1.0)

## 2021-03-29 LAB — CK: Total CK: 453 U/L — ABNORMAL HIGH (ref 38–234)

## 2021-03-29 MED ORDER — TOPIRAMATE 100 MG PO TABS
200.0000 mg | ORAL_TABLET | Freq: Every day | ORAL | Status: DC
  Administered 2021-03-29 – 2021-04-03 (×5): 200 mg via ORAL
  Filled 2021-03-29 (×6): qty 2

## 2021-03-29 MED ORDER — ONDANSETRON HCL 4 MG PO TABS: 4.0000 mg | ORAL_TABLET | Freq: Four times a day (QID) | ORAL | Status: DC | PRN

## 2021-03-29 MED ORDER — SODIUM CHLORIDE 0.9% FLUSH
3.0000 mL | Freq: Two times a day (BID) | INTRAVENOUS | Status: DC
  Administered 2021-03-29 – 2021-04-04 (×12): 3 mL via INTRAVENOUS

## 2021-03-29 MED ORDER — LISINOPRIL 20 MG PO TABS
30.0000 mg | ORAL_TABLET | Freq: Every day | ORAL | Status: DC
  Administered 2021-03-30 – 2021-04-04 (×5): 30 mg via ORAL
  Filled 2021-03-29 (×6): qty 1

## 2021-03-29 MED ORDER — FERROUS SULFATE 325 (65 FE) MG PO TABS
325.0000 mg | ORAL_TABLET | Freq: Two times a day (BID) | ORAL | Status: DC
  Administered 2021-03-30 – 2021-04-04 (×11): 325 mg via ORAL
  Filled 2021-03-29 (×11): qty 1

## 2021-03-29 MED ORDER — INSULIN GLARGINE 100 UNIT/ML ~~LOC~~ SOLN
15.0000 [IU] | Freq: Every day | SUBCUTANEOUS | Status: DC
  Filled 2021-03-29: qty 0.15

## 2021-03-29 MED ORDER — NITROFURANTOIN MONOHYD MACRO 100 MG PO CAPS
100.0000 mg | ORAL_CAPSULE | Freq: Two times a day (BID) | ORAL | Status: DC
  Administered 2021-03-29 – 2021-04-04 (×12): 100 mg via ORAL
  Filled 2021-03-29 (×13): qty 1

## 2021-03-29 MED ORDER — SODIUM CHLORIDE 0.9% FLUSH
3.0000 mL | INTRAVENOUS | Status: DC | PRN
  Administered 2021-03-31: 3 mL via INTRAVENOUS

## 2021-03-29 MED ORDER — HYDROMORPHONE HCL 1 MG/ML IJ SOLN: 0.5000 mg | Freq: Once | INTRAMUSCULAR | Status: DC

## 2021-03-29 MED ORDER — FLUTICASONE PROPIONATE 50 MCG/ACT NA SUSP: 1.0000 | Freq: Every day | NASAL | Status: DC | PRN

## 2021-03-29 MED ORDER — OXYCODONE-ACETAMINOPHEN 10-325 MG PO TABS: 1.0000 | ORAL_TABLET | Freq: Four times a day (QID) | ORAL | Status: DC | PRN

## 2021-03-29 MED ORDER — BUSPIRONE HCL 5 MG PO TABS
30.0000 mg | ORAL_TABLET | Freq: Two times a day (BID) | ORAL | Status: DC
  Administered 2021-03-29 – 2021-04-04 (×10): 30 mg via ORAL
  Filled 2021-03-29 (×11): qty 6

## 2021-03-29 MED ORDER — CALCIUM CARBONATE-VITAMIN D 500-200 MG-UNIT PO TABS: 1.0000 | ORAL_TABLET | Freq: Two times a day (BID) | ORAL | Status: DC

## 2021-03-29 MED ORDER — FENTANYL CITRATE (PF) 100 MCG/2ML IJ SOLN: 100.0000 ug | Freq: Once | INTRAMUSCULAR | Status: AC

## 2021-03-29 MED ORDER — ZOLPIDEM TARTRATE 5 MG PO TABS
5.0000 mg | ORAL_TABLET | Freq: Every day | ORAL | Status: DC
  Administered 2021-03-29 – 2021-04-03 (×5): 5 mg via ORAL
  Filled 2021-03-29 (×5): qty 1

## 2021-03-29 MED ORDER — OXYCODONE HCL 5 MG PO TABS
5.0000 mg | ORAL_TABLET | Freq: Four times a day (QID) | ORAL | Status: DC | PRN
  Administered 2021-03-29 – 2021-04-03 (×7): 5 mg via ORAL
  Filled 2021-03-29 (×8): qty 1

## 2021-03-29 MED ORDER — GABAPENTIN 300 MG PO CAPS: 300.0000 mg | ORAL_CAPSULE | Freq: Three times a day (TID) | ORAL | Status: DC

## 2021-03-29 MED ORDER — SUVOREXANT 15 MG PO TABS: 15.0000 mg | ORAL_TABLET | Freq: Every day | ORAL | Status: DC

## 2021-03-29 MED ORDER — TRIMETHOPRIM 100 MG PO TABS
100.0000 mg | ORAL_TABLET | Freq: Every day | ORAL | Status: DC
  Administered 2021-03-30 – 2021-04-04 (×6): 100 mg via ORAL
  Filled 2021-03-29 (×6): qty 1

## 2021-03-29 MED ORDER — METHOCARBAMOL 1000 MG/10ML IJ SOLN
500.0000 mg | Freq: Four times a day (QID) | INTRAVENOUS | Status: DC | PRN
  Administered 2021-03-29: 500 mg via INTRAVENOUS
  Filled 2021-03-29 (×2): qty 5

## 2021-03-29 MED ORDER — NITROFURANTOIN MACROCRYSTAL 100 MG PO CAPS
100.0000 mg | ORAL_CAPSULE | Freq: Two times a day (BID) | ORAL | Status: DC
  Filled 2021-03-29: qty 1

## 2021-03-29 MED ORDER — LISINOPRIL 10 MG PO TABS: 10.0000 mg | ORAL_TABLET | Freq: Every day | ORAL | Status: DC

## 2021-03-29 MED ORDER — METOPROLOL SUCCINATE ER 50 MG PO TB24
50.0000 mg | ORAL_TABLET | Freq: Every day | ORAL | Status: DC
  Administered 2021-03-30 – 2021-04-04 (×6): 50 mg via ORAL
  Filled 2021-03-29 (×6): qty 1

## 2021-03-29 MED ORDER — VILAZODONE HCL 40 MG PO TABS: 40.0000 mg | ORAL_TABLET | Freq: Every day | ORAL | Status: DC

## 2021-03-29 MED ORDER — SODIUM CHLORIDE 0.9 % IV BOLUS
1000.0000 mL | Freq: Once | INTRAVENOUS | Status: AC
  Administered 2021-03-29: 1000 mL via INTRAVENOUS

## 2021-03-29 MED ORDER — INSULIN GLARGINE 100 UNIT/ML SOLOSTAR PEN: 15.0000 [IU] | PEN_INJECTOR | Freq: Every day | SUBCUTANEOUS | Status: DC

## 2021-03-29 MED ORDER — MONTELUKAST SODIUM 10 MG PO TABS: 10.0000 mg | ORAL_TABLET | Freq: Every day | ORAL | Status: DC | PRN

## 2021-03-29 MED ORDER — TETANUS-DIPHTH-ACELL PERTUSSIS 5-2.5-18.5 LF-MCG/0.5 IM SUSY
0.5000 mL | PREFILLED_SYRINGE | Freq: Once | INTRAMUSCULAR | Status: AC
  Administered 2021-03-29: 0.5 mL via INTRAMUSCULAR
  Filled 2021-03-29: qty 0.5

## 2021-03-29 MED ORDER — INSULIN GLARGINE 100 UNIT/ML ~~LOC~~ SOLN
72.0000 [IU] | Freq: Every day | SUBCUTANEOUS | Status: DC
  Administered 2021-03-29 – 2021-04-03 (×6): 72 [IU] via SUBCUTANEOUS
  Filled 2021-03-29 (×7): qty 0.72

## 2021-03-29 MED ORDER — DULOXETINE HCL 60 MG PO CPEP
60.0000 mg | ORAL_CAPSULE | Freq: Every morning | ORAL | Status: DC
  Administered 2021-03-30 – 2021-04-04 (×5): 60 mg via ORAL
  Filled 2021-03-29 (×5): qty 1

## 2021-03-29 MED ORDER — SODIUM CHLORIDE 0.9 % IV SOLN
250.0000 mL | INTRAVENOUS | Status: DC | PRN
Start: 2021-03-29 — End: 2021-04-04

## 2021-03-29 MED ORDER — QUETIAPINE FUMARATE 100 MG PO TABS
200.0000 mg | ORAL_TABLET | Freq: Every day | ORAL | Status: DC
  Administered 2021-03-29 – 2021-04-03 (×5): 200 mg via ORAL
  Filled 2021-03-29 (×5): qty 2

## 2021-03-29 MED ORDER — LEVOTHYROXINE SODIUM 75 MCG PO TABS
75.0000 ug | ORAL_TABLET | Freq: Every day | ORAL | Status: DC
  Administered 2021-03-30 – 2021-04-04 (×6): 75 ug via ORAL
  Filled 2021-03-29 (×6): qty 1

## 2021-03-29 MED ORDER — ACETAMINOPHEN 500 MG PO TABS
1000.0000 mg | ORAL_TABLET | Freq: Once | ORAL | Status: AC
  Administered 2021-03-29: 1000 mg via ORAL
  Filled 2021-03-29: qty 2

## 2021-03-29 MED ORDER — GADOBUTROL 1 MMOL/ML IV SOLN
10.0000 mL | Freq: Once | INTRAVENOUS | Status: AC | PRN
  Administered 2021-03-29: 10 mL via INTRAVENOUS

## 2021-03-29 MED ORDER — LACTATED RINGERS IV BOLUS
1000.0000 mL | Freq: Once | INTRAVENOUS | Status: AC
  Administered 2021-03-29: 1000 mL via INTRAVENOUS

## 2021-03-29 MED ORDER — OXYCODONE-ACETAMINOPHEN 5-325 MG PO TABS
1.0000 | ORAL_TABLET | Freq: Four times a day (QID) | ORAL | Status: DC | PRN
Start: 2021-03-29 — End: 2021-04-04
  Administered 2021-03-30 – 2021-04-03 (×6): 1 via ORAL
  Filled 2021-03-29 (×8): qty 1

## 2021-03-29 MED ORDER — SENNOSIDES-DOCUSATE SODIUM 8.6-50 MG PO TABS: 1.0000 | ORAL_TABLET | Freq: Every evening | ORAL | Status: DC | PRN

## 2021-03-29 MED ORDER — PANTOPRAZOLE SODIUM 40 MG PO TBEC: 40.0000 mg | DELAYED_RELEASE_TABLET | Freq: Every day | ORAL | Status: DC | PRN

## 2021-03-29 MED ORDER — VILAZODONE HCL 20 MG PO TABS
40.0000 mg | ORAL_TABLET | Freq: Every day | ORAL | Status: DC
  Administered 2021-03-30 – 2021-04-04 (×5): 40 mg via ORAL
  Filled 2021-03-29 (×6): qty 2

## 2021-03-29 MED ORDER — ONDANSETRON HCL 4 MG/2ML IJ SOLN: 4.0000 mg | Freq: Four times a day (QID) | INTRAMUSCULAR | Status: DC | PRN

## 2021-03-29 MED ORDER — FENTANYL CITRATE (PF) 100 MCG/2ML IJ SOLN
INTRAMUSCULAR | Status: AC
  Administered 2021-03-29: 100 ug via INTRAVENOUS
  Filled 2021-03-29: qty 2

## 2021-03-29 MED ORDER — PNEUMOCOCCAL VAC POLYVALENT 25 MCG/0.5ML IJ INJ
0.5000 mL | INJECTION | INTRAMUSCULAR | Status: AC
  Administered 2021-04-03: 0.5 mL via INTRAMUSCULAR
  Filled 2021-03-29: qty 0.5

## 2021-03-29 MED ORDER — HYDROMORPHONE HCL 1 MG/ML IJ SOLN
0.5000 mg | Freq: Once | INTRAMUSCULAR | Status: AC
  Administered 2021-03-29: 0.5 mg via INTRAVENOUS
  Filled 2021-03-29: qty 1

## 2021-03-29 MED ORDER — ATORVASTATIN CALCIUM 40 MG PO TABS
40.0000 mg | ORAL_TABLET | Freq: Every day | ORAL | Status: DC
  Administered 2021-03-30 – 2021-04-04 (×6): 40 mg via ORAL
  Filled 2021-03-29 (×6): qty 1

## 2021-03-29 MED ORDER — GABAPENTIN 600 MG PO TABS
1200.0000 mg | ORAL_TABLET | Freq: Two times a day (BID) | ORAL | Status: DC
  Administered 2021-03-29 – 2021-04-04 (×11): 1200 mg via ORAL
  Filled 2021-03-29 (×11): qty 2

## 2021-03-29 MED ORDER — MORPHINE SULFATE (PF) 2 MG/ML IV SOLN
2.0000 mg | INTRAVENOUS | Status: DC | PRN
  Administered 2021-03-30 – 2021-04-02 (×2): 2 mg via INTRAVENOUS
  Filled 2021-03-29 (×2): qty 1

## 2021-03-29 NOTE — ED Notes (Signed)
Report given to Barnett Applebaum, RN at this time.

## 2021-03-29 NOTE — ED Notes (Signed)
MD aware of BP and will continue to monitor

## 2021-03-29 NOTE — ED Triage Notes (Signed)
Pt arrived via [facility] with reports of falling out of bed tonight, pt states she slid out of bed and hit her head, pt states on Monday she slipped in a mud puddle and fell hitting her head against the car. Pt appears to have some confusion because when trying to clarify if she hit her head tonight when she slid out of bed, pt states she hit it on the car.   Pt continues to c/o back pain, pt is pale on arrival, appears to have some slurring of her speech at times and is frequently drowsy.  Pt states she takes aspirin daily.

## 2021-03-29 NOTE — ED Notes (Signed)
Pt transferred via [facility] to [facility].   Pt has all belongings with her.  Pt PIV intact at this time. VSS. Pt denies andy questions or concerns about transfer.

## 2021-03-29 NOTE — ED Notes (Signed)
Pain meds given before ems trip for transfer per dr Jasmine Awe.   Pt alert.  Iv in place.

## 2021-03-29 NOTE — ED Notes (Signed)
Pt in MRI.

## 2021-03-29 NOTE — H&P (Signed)
Providing Compassionate, Quality Care - Together  NEUROSURGERY HISTORY & PHYSICAL   [name] is an 75 y.o. female.   Chief Complaint: Back pain HPI: This is a 75 year old female with a history of an L4-S1 fusion, most notably in extension fusion, L5-S1 by Dr. Annette Stable on February 09, 2021.  She noted a fall a few days ago walking in her yard and partially fell and since then has had intractable back pain throughout her entire spine.  She went to [facility] and was found to have multiple thoracic compression fractures and therefore was transferred here for care.  She denies any new numbness, tingling or weakness in her legs.  She denies any bowel or bladder changes.  She states that she was making improvement from her previous lumbar fusion until her fall.  Past Medical History:  Diagnosis Date  . Amnesia   . Anxiety   . Arthritis   . Back pain   . CHF (congestive heart failure) ([place])   . Chronic kidney disease    Acute on chronic 10/2020  . Chronic pain syndrome   . Cystocele   . Depression   . Diabetes mellitus without complication ([place])    Type II  . GERD (gastroesophageal reflux disease)   . History of blood transfusion    with knee placements  . Hyperlipidemia   . Hypertension   . IBS (irritable bowel syndrome)   . Obesity   . Osteoporosis   . Osteoporosis   . Panic attacks   . Seizure ([place])   . Shortness of breath dyspnea   . Sleep apnea    not using now  . Thyroid nodule     Past Surgical History:  Procedure Laterality Date  . ABDOMINAL HYSTERECTOMY    . APPENDECTOMY    . CARDIAC CATHETERIZATION    . CHOLECYSTECTOMY    . CORONARY STENT INTERVENTION N/A 05/05/2018   Procedure: CORONARY STENT INTERVENTION;  Surgeon: Isaias Cowman, MD;  Location: [facility];  Service: Cardiovascular;  Laterality: N/A;  . FOOT SURGERY    . LEFT HEART CATH AND CORONARY ANGIOGRAPHY Left 05/05/2018   Procedure: LEFT HEART CATH AND CORONARY ANGIOGRAPHY;   Surgeon: Isaias Cowman, MD;  Location: [facility];  Service: Cardiovascular;  Laterality: Left;  . OOPHORECTOMY    . REPLACEMENT TOTAL KNEE    . TONSILLECTOMY      Family History  Problem Relation Age of Onset  . Cancer Mother   . Cancer Father   . Cancer Sister   . Thyroid disease Neg Hx    Social History:  reports that she has never smoked. She has never used smokeless tobacco. She reports that she does not drink alcohol and does not use drugs.  Allergies: No Known Allergies  Medications Prior to Admission  Medication Sig Dispense Refill  . atorvastatin (LIPITOR) 40 MG tablet Take 40 mg by mouth daily.    . BELSOMRA 15 MG TABS Take 15 mg by mouth daily.    . busPIRone (BUSPAR) 30 MG tablet Take 30 mg by mouth 2 (two) times daily.    . DULoxetine (CYMBALTA) 60 MG capsule Take 60 mg by mouth in the morning.    . ferrous sulfate 325 (65 FE) MG tablet Take 325 mg by mouth 2 (two) times daily with a meal.    . fluticasone (FLONASE) 50 MCG/ACT nasal spray Place 1-2 sprays into both nostrils daily as needed for allergies or rhinitis.    Marland Kitchen  gabapentin (NEURONTIN) 600 MG tablet Take 1,200 mg by mouth 2 (two) times daily.    Marland Kitchen LANTUS SOLOSTAR 100 UNIT/ML Solostar Pen Inject 15 Units into the skin at bedtime. (Patient taking differently: Inject 72 Units into the skin at bedtime.) 15 mL 11  . levothyroxine (SYNTHROID) 75 MCG tablet Take 1 tablet (75 mcg total) by mouth daily before breakfast. 30 tablet 1  . lisinopril (ZESTRIL) 30 MG tablet Take 30 mg by mouth daily.    . metoprolol succinate (TOPROL-XL) 50 MG 24 hr tablet Take 50 mg by mouth daily. Take with or immediately following a meal.    . montelukast (SINGULAIR) 10 MG tablet Take 10 mg by mouth daily as needed (allergy).    . nitrofurantoin (MACRODANTIN) 100 MG capsule Take 1 capsule (100 mg total) by mouth 2 (two) times daily for 7 days. 14 capsule 0  . nystatin (MYCOSTATIN/NYSTOP) powder Apply 1 application topically 2  (two) times daily.    Marland Kitchen oxyCODONE-acetaminophen (PERCOCET) 10-325 MG tablet Take 1 tablet by mouth every 6 (six) hours as needed for pain. 20 tablet 0  . pantoprazole (PROTONIX) 40 MG tablet Take 40 mg by mouth daily as needed (heartburn).    . QUEtiapine (SEROQUEL) 200 MG tablet Take 200 mg by mouth at bedtime.    . topiramate (TOPAMAX) 200 MG tablet Take 200 mg by mouth at bedtime.    Marland Kitchen trimethoprim (TRIMPEX) 100 MG tablet Take 1 tablet (100 mg total) by mouth daily. 30 tablet 11  . VIIBRYD 40 MG TABS Take 40 mg by mouth daily.    . calcium-vitamin D (OSCAL 500/200 D-3) 500-200 MG-UNIT tablet Take 1 tablet by mouth 2 (two) times daily. (Patient not taking: No sig reported) 180 tablet 3  . gabapentin (NEURONTIN) 300 MG capsule Take 1 capsule (300 mg total) by mouth 3 (three) times daily. (Patient not taking: No sig reported) 90 capsule 0  . lisinopril (ZESTRIL) 10 MG tablet Take 1 tablet (10 mg total) by mouth daily. (Patient not taking: No sig reported) 30 tablet 0    Results for orders placed or performed during the hospital encounter of 03/29/21 (from the past 48 hour(s))  CBG monitoring, ED     Status: Abnormal   Collection Time: 03/29/21  2:31 AM  Result Value Ref Range   Glucose-Capillary 218 (H) 70 - 99 mg/dL    Comment: Glucose reference range applies only to samples taken after fasting for at least 8 hours.  Resp Panel by RT-PCR (Flu A&B, Covid) Nasopharyngeal Swab     Status: None   Collection Time: 03/29/21  2:40 AM   Specimen: Nasopharyngeal Swab; Nasopharyngeal(NP) swabs in vial transport medium  Result Value Ref Range   SARS Coronavirus 2 by RT PCR NEGATIVE NEGATIVE    Comment: (NOTE) SARS-CoV-2 target nucleic acids are NOT DETECTED.  The SARS-CoV-2 RNA is generally detectable in upper respiratory specimens during the acute phase of infection. The lowest concentration of SARS-CoV-2 viral copies this assay can detect is 138 copies/mL. A negative result does not preclude  SARS-Cov-2 infection and should not be used as the sole basis for treatment or other patient management decisions. A negative result may occur with  improper specimen collection/handling, submission of specimen other than nasopharyngeal swab, presence of viral mutation(s) within the areas targeted by this assay, and inadequate number of viral copies(<138 copies/mL). A negative result must be combined with clinical observations, patient history, and epidemiological information. The expected result is Negative.  Fact Sheet for  Patients:  EntrepreneurPulse.com.au  Fact Sheet for Healthcare Providers:  IncredibleEmployment.be  This test is no t yet approved or cleared by the [place] FDA and  has been authorized for detection and/or diagnosis of SARS-CoV-2 by FDA under an Emergency Use Authorization (EUA). This EUA will remain  in effect (meaning this test can be used) for the duration of the COVID-19 declaration under Section 564(b)(1) of the Act, 21 U.S.C.section 360bbb-3(b)(1), unless the authorization is terminated  or revoked sooner.       Influenza A by PCR NEGATIVE NEGATIVE   Influenza B by PCR NEGATIVE NEGATIVE    Comment: (NOTE) The Xpert Xpress SARS-CoV-2/FLU/RSV plus assay is intended as an aid in the diagnosis of influenza from Nasopharyngeal swab specimens and should not be used as a sole basis for treatment. Nasal washings and aspirates are unacceptable for Xpert Xpress SARS-CoV-2/FLU/RSV testing.  Fact Sheet for Patients: EntrepreneurPulse.com.au  Fact Sheet for Healthcare Providers: IncredibleEmployment.be  This test is not yet approved or cleared by the [place] FDA and has been authorized for detection and/or diagnosis of SARS-CoV-2 by FDA under an Emergency Use Authorization (EUA). This EUA will remain in effect (meaning this test can be used) for the duration of the COVID-19  declaration under Section 564(b)(1) of the Act, 21 U.S.C. section 360bbb-3(b)(1), unless the authorization is terminated or revoked.  Performed at [facility], [address] [place], [place] [address]   CBC with Differential     Status: Abnormal   Collection Time: 03/29/21  2:40 AM  Result Value Ref Range   WBC 11.5 (H) 4.0 - 10.5 K/uL   RBC 3.31 (L) 3.87 - 5.11 MIL/uL   Hemoglobin 9.7 (L) 12.0 - 15.0 g/dL   HCT 31.8 (L) 36.0 - 46.0 %   MCV 96.1 80.0 - 100.0 fL   MCH 29.3 26.0 - 34.0 pg   MCHC 30.5 30.0 - 36.0 g/dL   RDW 15.9 (H) 11.5 - 15.5 %   Platelets 144 (L) 150 - 400 K/uL   nRBC 0.0 0.0 - 0.2 %   Neutrophils Relative % 65 %   Neutro Abs 7.5 1.7 - 7.7 K/uL   Lymphocytes Relative 17 %   Lymphs Abs 1.9 0.7 - 4.0 K/uL   Monocytes Relative 9 %   Monocytes Absolute 1.1 (H) 0.1 - 1.0 K/uL   Eosinophils Relative 7 %   Eosinophils Absolute 0.8 (H) 0.0 - 0.5 K/uL   Basophils Relative 1 %   Basophils Absolute 0.1 0.0 - 0.1 K/uL   Immature Granulocytes 1 %   Abs Immature Granulocytes 0.10 (H) 0.00 - 0.07 K/uL    Comment: Performed at [facility], [address] [place],  [address]  Comprehensive metabolic panel     Status: Abnormal   Collection Time: 03/29/21  2:40 AM  Result Value Ref Range   Sodium 139 135 - 145 mmol/L   Potassium 4.3 3.5 - 5.1 mmol/L   Chloride 111 98 - 111 mmol/L   CO2 20 (L) 22 - 32 mmol/L   Glucose, Bld 226 (H) 70 - 99 mg/dL    Comment: Glucose reference range applies only to samples taken after fasting for at least 8 hours.   BUN 24 (H) 8 - 23 mg/dL   Creatinine, Ser 1.28 (H) 0.44 - 1.00 mg/dL   Calcium 8.8 (L) 8.9 - 10.3 mg/dL   Total Protein 6.7 6.5 - 8.1 g/dL   Albumin 3.5 3.5 - 5.0 g/dL   AST 32  15 - 41 U/L   ALT 23 0 - 44 U/L   Alkaline Phosphatase 107 38 - 126 U/L   Total Bilirubin 1.0 0.3 - 1.2 mg/dL   GFR, Estimated 44 (L) >60 mL/min    Comment: (NOTE) Calculated using the CKD-EPI Creatinine Equation  (2021)    Anion gap 8 5 - 15    Comment: Performed at [facility], [place], [place] [address]  Troponin I (High Sensitivity)     Status: None   Collection Time: 03/29/21  2:40 AM  Result Value Ref Range   Troponin I (High Sensitivity) 5 <18 ng/L    Comment: (NOTE) Elevated high sensitivity troponin I (hsTnI) values and significant  changes across serial measurements may suggest ACS but many other  chronic and acute conditions are known to elevate hsTnI results.  Refer to the "Links" section for chest pain algorithms and additional  guidance. Performed at [facility], [address] [place], [place] [address]   CK     Status: Abnormal   Collection Time: 03/29/21  2:40 AM  Result Value Ref Range   Total CK 453 (H) 38 - 234 U/L    Comment: Performed at [facility], [place], [place] [address]  Troponin I (High Sensitivity)     Status: None   Collection Time: 03/29/21  4:51 AM  Result Value Ref Range   Troponin I (High Sensitivity) 4 <18 ng/L    Comment: (NOTE) Elevated high sensitivity troponin I (hsTnI) values and significant  changes across serial measurements may suggest ACS but many other  chronic and acute conditions are known to elevate hsTnI results.  Refer to the "Links" section for chest pain algorithms and additional  guidance. Performed at [facility], [address] [place], [place] [address]   Sedimentation rate     Status: Abnormal   Collection Time: 03/29/21  5:42 AM  Result Value Ref Range   Sed Rate 63 (H) 0 - 30 mm/hr    Comment: Performed at [facility], [place], [place] [address]  C-reactive protein     Status: Abnormal   Collection Time: 03/29/21  5:42 AM  Result Value Ref Range   CRP 6.6 (H) <1.0 mg/dL    Comment: Performed at [facility], [address], [place], [place] [address]   CT Head Wo Contrast  Result Date: 03/29/2021 CLINICAL  DATA:  Nonlocalized acute abdominal pain. Status post fall out of bed. Status post lumbar interbody fusion on for 02/09/21 EXAM: CT HEAD WITHOUT CONTRAST CT CERVICAL SPINE WITHOUT CONTRAST CT CHEST, ABDOMEN AND PELVIS WITHOUT CONTRAST TECHNIQUE: Multidetector CT imaging of the head and cervical spine was performed following the standard protocol without intravenous contrast. Multiplanar CT image reconstructions of the cervical spine were also generated. Multidetector CT imaging of the chest, abdomen and pelvis was performed following the standard protocol without IV contrast. COMPARISON:  CT head C-spine 06/12/2020, CT head C-spine 03/06/2020 CT angio chest 02/06/2020, MRI lumbar spine 08/31/2020, CT renal 03/06/2020 FINDINGS: CT HEAD FINDINGS Brain: No evidence of large-territorial acute infarction. No parenchymal hemorrhage. No mass lesion. No extra-axial collection. No mass effect or midline shift. No hydrocephalus. Basilar cisterns are patent. Vascular: No hyperdense vessel. Skull: No acute fracture or focal lesion. Sinuses/Orbits: Paranasal sinuses are clear. Trace fluid within bilateral mastoid air cells. No fluid within middle ears. Bilateral lens replacement. Otherwise the orbits are unremarkable. Other: None. CT CERVICAL SPINE  FINDINGS Alignment: Normal. Skull base and vertebrae: No acute fracture. No aggressive appearing focal osseous lesion or focal pathologic process. Soft tissues and spinal canal: No prevertebral fluid or swelling. No visible canal hematoma. Upper chest: Unremarkable. Other: None. CT CHEST: Ports and Devices: None. Lungs/airways: Expiratory phase of respiration bilateral lower lobe subsegmental atelectasis. No focal consolidation. No pulmonary nodule. No pulmonary mass. No pulmonary contusion or laceration. No pneumatocele formation. The central airways are patent. Pleura: No pleural effusion. No pneumothorax. No hemothorax. Lymph Nodes: Calcified left hilar lymph nodes. Limited  evaluation for hilar lymphadenopathy on this noncontrast study. No mediastinal or axillary lymphadenopathy. Mediastinum: No pneumomediastinum. The thoracic aorta is normal in caliber. The heart is normal in size. Trace pericardial effusion. Four-vessel coronary artery calcifications. Mild to moderate atherosclerotic plaque. The esophagus is unremarkable. The thyroid is unremarkable. Chest Wall / Breasts: No chest wall mass. Musculoskeletal: No acute rib or sternal fracture. Acute fracture of the T12 vertebral body with extension posteriorly to the left articular facet. CT ABDOMEN / PELVIS: Liver: Not enlarged. No focal lesion. Biliary System: The gallbladder is otherwise unremarkable with no radio-opaque gallstones. No biliary ductal dilatation. Pancreas: Normal pancreatic contour. No main pancreatic duct dilatation. Spleen: Not enlarged. No focal lesion. Adrenal Glands: No nodularity bilaterally. Kidneys: No hydroureteronephrosis. No nephroureterolithiasis. No contour deforming renal mass. The urinary bladder is unremarkable. Bowel: No small or large bowel wall thickening or dilatation. Stool throughout the colon. The appendix is unremarkable. Mesentery, Omentum, and Peritoneum: No simple free fluid ascites. No pneumoperitoneum. No mesenteric hematoma identified. No organized fluid collection. Pelvic Organs: Normal. Lymph Nodes: No abdominal, pelvic, inguinal lymphadenopathy. Vasculature: Atherosclerotic plaque. No abdominal aorta or iliac aneurysm. Musculoskeletal: No acute pelvic fracture. Redemonstration of a chronic L1 compression fracture. L4-S1 posterolateral fusion as well as interbody fusion with interval development of associated cortical erosion and destruction of the anterior and posterior elements of the surgerized levels. Question superimposed age-indeterminate fracture of the S1 vertebral body (6:125). There is an overlying 6.7 x 6 x 9 cm organized fluid collection within the soft tissues overlying  the surgerized levels. The fluid collection is noted to abut the surgical hardware as well as surgerized posterior elements. IMPRESSION: 1. No acute intracranial abnormality. 2. No acute displaced fracture or traumatic listhesis of the cervical spine. 3.  No acute traumatic injury to the chest, abdomen, or pelvis. 4. Acute fracture of the T12 vertebral body with extension posteriorly to the left articular facet. 5. L4-S1 posterior fusion with interval development of associated cortical erosion and destruction of the surgerized levels concerning for osteomyelitis. Question superimposed age-indeterminate fracture of the S1 vertebral body. 6. Associated overlying 6.7 x 6 x 9 cm organized fluid collection within the soft tissues overlying the surgerized levels. The fluid collection is noted to abut the surgical hardware as well as surgerized posterior elements. Limited evaluation on this noncontrast with differential diagnosis including an abscess formation versus seroma. 7. Chronic L1 compression fracture. 8. Aortic Atherosclerosis (ICD10-I70.0) including four-vessel coronary artery calcifications. These results were called by telephone at the time of interpretation on 03/29/2021 at 4:29 am to provider [facility] , who verbally acknowledged these results. Electronically Signed   By: Iven Finn M.D.   On: 03/29/2021 04:31   CT Cervical Spine Wo Contrast  Result Date: 03/29/2021 CLINICAL DATA:  Nonlocalized acute abdominal pain. Status post fall out of bed. Status post lumbar interbody fusion on for 02/09/21 EXAM: CT HEAD WITHOUT CONTRAST CT CERVICAL SPINE WITHOUT CONTRAST CT CHEST, ABDOMEN  AND PELVIS WITHOUT CONTRAST TECHNIQUE: Multidetector CT imaging of the head and cervical spine was performed following the standard protocol without intravenous contrast. Multiplanar CT image reconstructions of the cervical spine were also generated. Multidetector CT imaging of the chest, abdomen and pelvis was performed  following the standard protocol without IV contrast. COMPARISON:  CT head C-spine 06/12/2020, CT head C-spine 03/06/2020 CT angio chest 02/06/2020, MRI lumbar spine 08/31/2020, CT renal 03/06/2020 FINDINGS: CT HEAD FINDINGS Brain: No evidence of large-territorial acute infarction. No parenchymal hemorrhage. No mass lesion. No extra-axial collection. No mass effect or midline shift. No hydrocephalus. Basilar cisterns are patent. Vascular: No hyperdense vessel. Skull: No acute fracture or focal lesion. Sinuses/Orbits: Paranasal sinuses are clear. Trace fluid within bilateral mastoid air cells. No fluid within middle ears. Bilateral lens replacement. Otherwise the orbits are unremarkable. Other: None. CT CERVICAL SPINE FINDINGS Alignment: Normal. Skull base and vertebrae: No acute fracture. No aggressive appearing focal osseous lesion or focal pathologic process. Soft tissues and spinal canal: No prevertebral fluid or swelling. No visible canal hematoma. Upper chest: Unremarkable. Other: None. CT CHEST: Ports and Devices: None. Lungs/airways: Expiratory phase of respiration bilateral lower lobe subsegmental atelectasis. No focal consolidation. No pulmonary nodule. No pulmonary mass. No pulmonary contusion or laceration. No pneumatocele formation. The central airways are patent. Pleura: No pleural effusion. No pneumothorax. No hemothorax. Lymph Nodes: Calcified left hilar lymph nodes. Limited evaluation for hilar lymphadenopathy on this noncontrast study. No mediastinal or axillary lymphadenopathy. Mediastinum: No pneumomediastinum. The thoracic aorta is normal in caliber. The heart is normal in size. Trace pericardial effusion. Four-vessel coronary artery calcifications. Mild to moderate atherosclerotic plaque. The esophagus is unremarkable. The thyroid is unremarkable. Chest Wall / Breasts: No chest wall mass. Musculoskeletal: No acute rib or sternal fracture. Acute fracture of the T12 vertebral body with extension  posteriorly to the left articular facet. CT ABDOMEN / PELVIS: Liver: Not enlarged. No focal lesion. Biliary System: The gallbladder is otherwise unremarkable with no radio-opaque gallstones. No biliary ductal dilatation. Pancreas: Normal pancreatic contour. No main pancreatic duct dilatation. Spleen: Not enlarged. No focal lesion. Adrenal Glands: No nodularity bilaterally. Kidneys: No hydroureteronephrosis. No nephroureterolithiasis. No contour deforming renal mass. The urinary bladder is unremarkable. Bowel: No small or large bowel wall thickening or dilatation. Stool throughout the colon. The appendix is unremarkable. Mesentery, Omentum, and Peritoneum: No simple free fluid ascites. No pneumoperitoneum. No mesenteric hematoma identified. No organized fluid collection. Pelvic Organs: Normal. Lymph Nodes: No abdominal, pelvic, inguinal lymphadenopathy. Vasculature: Atherosclerotic plaque. No abdominal aorta or iliac aneurysm. Musculoskeletal: No acute pelvic fracture. Redemonstration of a chronic L1 compression fracture. L4-S1 posterolateral fusion as well as interbody fusion with interval development of associated cortical erosion and destruction of the anterior and posterior elements of the surgerized levels. Question superimposed age-indeterminate fracture of the S1 vertebral body (6:125). There is an overlying 6.7 x 6 x 9 cm organized fluid collection within the soft tissues overlying the surgerized levels. The fluid collection is noted to abut the surgical hardware as well as surgerized posterior elements. IMPRESSION: 1. No acute intracranial abnormality. 2. No acute displaced fracture or traumatic listhesis of the cervical spine. 3.  No acute traumatic injury to the chest, abdomen, or pelvis. 4. Acute fracture of the T12 vertebral body with extension posteriorly to the left articular facet. 5. L4-S1 posterior fusion with interval development of associated cortical erosion and destruction of the surgerized  levels concerning for osteomyelitis. Question superimposed age-indeterminate fracture of the S1 vertebral body. 6. Associated  overlying 6.7 x 6 x 9 cm organized fluid collection within the soft tissues overlying the surgerized levels. The fluid collection is noted to abut the surgical hardware as well as surgerized posterior elements. Limited evaluation on this noncontrast with differential diagnosis including an abscess formation versus seroma. 7. Chronic L1 compression fracture. 8. Aortic Atherosclerosis (ICD10-I70.0) including four-vessel coronary artery calcifications. These results were called by telephone at the time of interpretation on 03/29/2021 at 4:29 am to provider [facility] , who verbally acknowledged these results. Electronically Signed   By: Iven Finn M.D.   On: 03/29/2021 04:31   MR THORACIC SPINE W WO CONTRAST  Result Date: 03/29/2021 CLINICAL DATA:  Fall on Monday with back pain EXAM: MRI THORACIC AND LUMBAR SPINE WITHOUT AND WITH CONTRAST TECHNIQUE: Multiplanar and multiecho pulse sequences of the thoracic and lumbar spine were obtained without and with intravenous contrast. CONTRAST:  46mL GADAVIST GADOBUTROL 1 MMOL/ML IV SOLN COMPARISON:  Lumbar MRI 08/31/2020 FINDINGS: MRI THORACIC SPINE FINDINGS Alignment:  Exaggerated thoracic kyphosis. Vertebrae: Atypical hemangioma at T10. Remote T4 compression fracture with moderate height loss. Remote T2 superior endplate fracture. T12 superior endplate fracture with minor edema but acute appearing by CT. Transverse process edema on the right, involving the second through eighth vertebrae based on CT coronal reformats. Cord:  Normal Paraspinal and other soft tissues: Mild dependent atelectasis. Disc levels: Generalized disc desiccation and narrowing. Central disc protrusions at the midthoracic levels, most notable at T7-8 where there is cord contact. Negative for foraminal impingement. MRI LUMBAR SPINE FINDINGS Segmentation:  5 lumbar type  vertebrae Alignment:  Normal Vertebrae: Remote L1 compression fracture with moderate height loss posteriorly and retropulsion. The retropulsed fragment does not compress the cord. L4-5 and L5-S1 PLIF. Nonacute fracture through the anterior superior corner of S1. Conus medullaris: Extends to the L2 level and appears normal. Paraspinal and other soft tissues: Incisional fluid collection spanning subcutaneous space to the inter spinous space at L5-S1, 11 cm craniocaudal and 6.5 cm anterior to posterior. Disc levels: T12- L1: Unremarkable. L1-L2: Mild facet spur. L2-L3: Mild disc bulging and facet spurring L3-L4: Mild disc bulging and facet spurring L4-L5: PLIF.  No neural compression L5-S1:PLIF. There is central disc protrusion without neural compression. IMPRESSION: 1. T12 compression fracture with mild height loss, acute based on preceding CT. 2. Acute right transverse process fractures involving T2-T8 and L5. 3. Remote compression fractures of T2, T4, and L1. 4. L4-5 and L5-S1 PLIF. 5. Large incisional fluid collection at the operative levels, but without thecal sac mass effect. 6. Nonacute S1 superior endplate fracture. No detected arthrodesis at L5-S1 by CT. Electronically Signed   By: Monte Fantasia M.D.   On: 03/29/2021 07:32   MR Lumbar Spine W Wo Contrast  Result Date: 03/29/2021 CLINICAL DATA:  Fall on Monday with back pain EXAM: MRI THORACIC AND LUMBAR SPINE WITHOUT AND WITH CONTRAST TECHNIQUE: Multiplanar and multiecho pulse sequences of the thoracic and lumbar spine were obtained without and with intravenous contrast. CONTRAST:  68mL GADAVIST GADOBUTROL 1 MMOL/ML IV SOLN COMPARISON:  Lumbar MRI 08/31/2020 FINDINGS: MRI THORACIC SPINE FINDINGS Alignment:  Exaggerated thoracic kyphosis. Vertebrae: Atypical hemangioma at T10. Remote T4 compression fracture with moderate height loss. Remote T2 superior endplate fracture. T12 superior endplate fracture with minor edema but acute appearing by CT. Transverse  process edema on the right, involving the second through eighth vertebrae based on CT coronal reformats. Cord:  Normal Paraspinal and other soft tissues: Mild dependent atelectasis. Disc levels: Generalized disc  desiccation and narrowing. Central disc protrusions at the midthoracic levels, most notable at T7-8 where there is cord contact. Negative for foraminal impingement. MRI LUMBAR SPINE FINDINGS Segmentation:  5 lumbar type vertebrae Alignment:  Normal Vertebrae: Remote L1 compression fracture with moderate height loss posteriorly and retropulsion. The retropulsed fragment does not compress the cord. L4-5 and L5-S1 PLIF. Nonacute fracture through the anterior superior corner of S1. Conus medullaris: Extends to the L2 level and appears normal. Paraspinal and other soft tissues: Incisional fluid collection spanning subcutaneous space to the inter spinous space at L5-S1, 11 cm craniocaudal and 6.5 cm anterior to posterior. Disc levels: T12- L1: Unremarkable. L1-L2: Mild facet spur. L2-L3: Mild disc bulging and facet spurring L3-L4: Mild disc bulging and facet spurring L4-L5: PLIF.  No neural compression L5-S1:PLIF. There is central disc protrusion without neural compression. IMPRESSION: 1. T12 compression fracture with mild height loss, acute based on preceding CT. 2. Acute right transverse process fractures involving T2-T8 and L5. 3. Remote compression fractures of T2, T4, and L1. 4. L4-5 and L5-S1 PLIF. 5. Large incisional fluid collection at the operative levels, but without thecal sac mass effect. 6. Nonacute S1 superior endplate fracture. No detected arthrodesis at L5-S1 by CT. Electronically Signed   By: Monte Fantasia M.D.   On: 03/29/2021 07:32   DG Knee Complete 4 Views Right  Result Date: 03/29/2021 CLINICAL DATA:  Knee pain. Bruising and abrasions to the right knee. EXAM: RIGHT KNEE - COMPLETE 4+ VIEW COMPARISON:  X-ray right knee 06/30/2016 FINDINGS: Total right knee arthroplasty appear no evidence  of fracture, dislocation, or joint effusion. No evidence of arthropathy or other focal bone abnormality. Subcutaneus soft tissue edema. Likely dermal calcifications. IMPRESSION: 1. No acute displaced fracture or dislocation. 2. Subcutaneus soft tissue edema. Electronically Signed   By: Iven Finn M.D.   On: 03/29/2021 03:35   CT CHEST ABDOMEN PELVIS WO CONTRAST  Result Date: 03/29/2021 CLINICAL DATA:  Nonlocalized acute abdominal pain. Status post fall out of bed. Status post lumbar interbody fusion on for 02/09/21 EXAM: CT HEAD WITHOUT CONTRAST CT CERVICAL SPINE WITHOUT CONTRAST CT CHEST, ABDOMEN AND PELVIS WITHOUT CONTRAST TECHNIQUE: Multidetector CT imaging of the head and cervical spine was performed following the standard protocol without intravenous contrast. Multiplanar CT image reconstructions of the cervical spine were also generated. Multidetector CT imaging of the chest, abdomen and pelvis was performed following the standard protocol without IV contrast. COMPARISON:  CT head C-spine 06/12/2020, CT head C-spine 03/06/2020 CT angio chest 02/06/2020, MRI lumbar spine 08/31/2020, CT renal 03/06/2020 FINDINGS: CT HEAD FINDINGS Brain: No evidence of large-territorial acute infarction. No parenchymal hemorrhage. No mass lesion. No extra-axial collection. No mass effect or midline shift. No hydrocephalus. Basilar cisterns are patent. Vascular: No hyperdense vessel. Skull: No acute fracture or focal lesion. Sinuses/Orbits: Paranasal sinuses are clear. Trace fluid within bilateral mastoid air cells. No fluid within middle ears. Bilateral lens replacement. Otherwise the orbits are unremarkable. Other: None. CT CERVICAL SPINE FINDINGS Alignment: Normal. Skull base and vertebrae: No acute fracture. No aggressive appearing focal osseous lesion or focal pathologic process. Soft tissues and spinal canal: No prevertebral fluid or swelling. No visible canal hematoma. Upper chest: Unremarkable. Other: None. CT  CHEST: Ports and Devices: None. Lungs/airways: Expiratory phase of respiration bilateral lower lobe subsegmental atelectasis. No focal consolidation. No pulmonary nodule. No pulmonary mass. No pulmonary contusion or laceration. No pneumatocele formation. The central airways are patent. Pleura: No pleural effusion. No pneumothorax. No hemothorax. Lymph Nodes: Calcified  left hilar lymph nodes. Limited evaluation for hilar lymphadenopathy on this noncontrast study. No mediastinal or axillary lymphadenopathy. Mediastinum: No pneumomediastinum. The thoracic aorta is normal in caliber. The heart is normal in size. Trace pericardial effusion. Four-vessel coronary artery calcifications. Mild to moderate atherosclerotic plaque. The esophagus is unremarkable. The thyroid is unremarkable. Chest Wall / Breasts: No chest wall mass. Musculoskeletal: No acute rib or sternal fracture. Acute fracture of the T12 vertebral body with extension posteriorly to the left articular facet. CT ABDOMEN / PELVIS: Liver: Not enlarged. No focal lesion. Biliary System: The gallbladder is otherwise unremarkable with no radio-opaque gallstones. No biliary ductal dilatation. Pancreas: Normal pancreatic contour. No main pancreatic duct dilatation. Spleen: Not enlarged. No focal lesion. Adrenal Glands: No nodularity bilaterally. Kidneys: No hydroureteronephrosis. No nephroureterolithiasis. No contour deforming renal mass. The urinary bladder is unremarkable. Bowel: No small or large bowel wall thickening or dilatation. Stool throughout the colon. The appendix is unremarkable. Mesentery, Omentum, and Peritoneum: No simple free fluid ascites. No pneumoperitoneum. No mesenteric hematoma identified. No organized fluid collection. Pelvic Organs: Normal. Lymph Nodes: No abdominal, pelvic, inguinal lymphadenopathy. Vasculature: Atherosclerotic plaque. No abdominal aorta or iliac aneurysm. Musculoskeletal: No acute pelvic fracture. Redemonstration of a chronic  L1 compression fracture. L4-S1 posterolateral fusion as well as interbody fusion with interval development of associated cortical erosion and destruction of the anterior and posterior elements of the surgerized levels. Question superimposed age-indeterminate fracture of the S1 vertebral body (6:125). There is an overlying 6.7 x 6 x 9 cm organized fluid collection within the soft tissues overlying the surgerized levels. The fluid collection is noted to abut the surgical hardware as well as surgerized posterior elements. IMPRESSION: 1. No acute intracranial abnormality. 2. No acute displaced fracture or traumatic listhesis of the cervical spine. 3.  No acute traumatic injury to the chest, abdomen, or pelvis. 4. Acute fracture of the T12 vertebral body with extension posteriorly to the left articular facet. 5. L4-S1 posterior fusion with interval development of associated cortical erosion and destruction of the surgerized levels concerning for osteomyelitis. Question superimposed age-indeterminate fracture of the S1 vertebral body. 6. Associated overlying 6.7 x 6 x 9 cm organized fluid collection within the soft tissues overlying the surgerized levels. The fluid collection is noted to abut the surgical hardware as well as surgerized posterior elements. Limited evaluation on this noncontrast with differential diagnosis including an abscess formation versus seroma. 7. Chronic L1 compression fracture. 8. Aortic Atherosclerosis (ICD10-I70.0) including four-vessel coronary artery calcifications. These results were called by telephone at the time of interpretation on 03/29/2021 at 4:29 am to provider [facility] , who verbally acknowledged these results. Electronically Signed   By: Iven Finn M.D.   On: 03/29/2021 04:31    ROS 14 point review of systems was obtained all pertinent positives and negatives are listed in HPI above  There were no vitals taken for this visit. Physical Exam  Awake alert oriented x3, mild  distress due to pain Morbidly obese Follows commands x4 PERRLA EOMI Face symmetric Full strength bilateral upper/lower extremities Well-healed incision over the lumbar spine Tenderness to palpation over the upper thoracic and lower thoracic spine  Assessment/Plan 75 year old female with  1. T12 acute compression fracture with mild loss of height 2.  Multiple TP fractures, right, T2-8, L5 3.  History of L4-S1 fusion  -Admit for pain control -We will obtain TLSO brace, and upright x-rays -PT/OT eval once brace obtained -No acute surgical intervention at this time. -SCDs   Thank you  for allowing me to participate in this patient's care.  Please do not hesitate to call with questions or concerns.   Elwin Sleight, [facility] Cell: [phone]

## 2021-03-29 NOTE — ED Provider Notes (Signed)
[facility] Emergency Department Provider Note  ____________________________________________   Event Date/Time   First MD Initiated Contact with Patient 03/29/21 [id number]     (approximate)  I have reviewed the triage vital signs and the nursing notes.   HISTORY  Chief Complaint Fall    HPI [name] is a 75 y.o. female with CHF, diabetes who comes in for a fall.  Patient comes in from EMS.  Patient states that she had a fall on Monday where she slipped on mud and fell onto her right side.  Patient did report hitting her head.  She said at that time she did not want to come into the ER.  She states that she regrets this, since then she has been pretty much bedbound due to pain all over her body.  She states that she tried to stand up again today and she fell again hitting her head again tonight.  Her pain is all over her body, constant, nothing makes it better, nothing makes it worse.  Patient states that she takes a aspirin daily.          Past Medical History:  Diagnosis Date  . Amnesia   . Anxiety   . Arthritis   . Back pain   . CHF (congestive heart failure) ([place])   . Chronic kidney disease    Acute on chronic 10/2020  . Chronic pain syndrome   . Cystocele   . Depression   . Diabetes mellitus without complication ([place])    Type II  . GERD (gastroesophageal reflux disease)   . History of blood transfusion    with knee placements  . Hyperlipidemia   . Hypertension   . IBS (irritable bowel syndrome)   . Obesity   . Osteoporosis   . Osteoporosis   . Panic attacks   . Seizure ([place])   . Shortness of breath dyspnea   . Sleep apnea    not using now  . Thyroid nodule     Patient Active Problem List   Diagnosis Date Noted  . Anemia, blood loss   . Hypothyroidism   . AMS (altered mental status) 02/15/2021  . AMS (altered mental status) 02/15/2021  . Lumbar foraminal stenosis 02/09/2021  . Acute kidney injury superimposed on CKD ([place])  03/06/2020  . Hyperkalemia 03/06/2020  . Acute metabolic encephalopathy 29/56/2130  . Acute respiratory failure with hypoxia ([place]) 02/06/2020  . OSA (obstructive sleep apnea) 02/06/2020  . CPAP ventilation treatment not tolerated 02/06/2020  . Morbid obesity with BMI of 45.0-49.9, adult ([place]) 02/06/2020  . Lactic acidosis 02/06/2020  . possible Sepsis ([place]) 02/06/2020  . Polypharmacy 02/06/2020  . CAD (coronary artery disease) 05/05/2018  . Microscopic hematuria 09/17/2016  . Cystitis, chronic 09/17/2016  . Adrenal cortical nodule ([place]) LEFT, small 09/17/2016  . Closed fracture of metatarsal bone 07/22/2016  . Plantar fasciitis, left 05/28/2016  . Degenerative spondylolisthesis 01/02/2016  . Tachycardia 01/02/2016  . Type 2 diabetes mellitus with hyperlipidemia ([place])   . Essential hypertension   . Non-toxic multinodular goiter 10/11/2014  . Diabetes mellitus ([place]) 10/11/2014  . Hyperlipidemia 10/11/2014  . Depression 10/11/2014  . Osteoporosis 10/11/2014  . GERD (gastroesophageal reflux disease) 10/11/2014  . Arthritis 10/11/2014  . Cystocele 10/11/2014  . Chronic pain syndrome 10/11/2014  . Amnesia 10/11/2014  . Back pain 10/11/2014    Past Surgical History:  Procedure Laterality Date  . ABDOMINAL HYSTERECTOMY    . APPENDECTOMY    . CARDIAC CATHETERIZATION    .  CHOLECYSTECTOMY    . CORONARY STENT INTERVENTION N/A 05/05/2018   Procedure: CORONARY STENT INTERVENTION;  Surgeon: Isaias Cowman, MD;  Location: [facility];  Service: Cardiovascular;  Laterality: N/A;  . FOOT SURGERY    . LEFT HEART CATH AND CORONARY ANGIOGRAPHY Left 05/05/2018   Procedure: LEFT HEART CATH AND CORONARY ANGIOGRAPHY;  Surgeon: Isaias Cowman, MD;  Location: [facility];  Service: Cardiovascular;  Laterality: Left;  . OOPHORECTOMY    . REPLACEMENT TOTAL KNEE    . TONSILLECTOMY      Prior to Admission medications   Medication Sig Start Date End Date Taking?  Authorizing Provider  atorvastatin (LIPITOR) 40 MG tablet Take 40 mg by mouth daily. 12/06/19   [provider]  BELSOMRA 15 MG TABS  03/16/21   [provider]  busPIRone (BUSPAR) 30 MG tablet Take 30 mg by mouth 2 (two) times daily.    [provider]  calcium-vitamin D (OSCAL 500/200 D-3) 500-200 MG-UNIT tablet Take 1 tablet by mouth 2 (two) times daily. 02/20/21 02/20/22  Fritzi Mandes, MD  DULoxetine (CYMBALTA) 60 MG capsule Take 60 mg by mouth in the morning.    [provider]  fluticasone (FLONASE) 50 MCG/ACT nasal spray Place 1-2 sprays into both nostrils daily as needed for allergies or rhinitis.    [provider]  gabapentin (NEURONTIN) 300 MG capsule Take 1 capsule (300 mg total) by mouth 3 (three) times daily. Patient taking differently: Take 300 mg by mouth 2 (two) times daily. 02/07/20   Lorella Nimrod, MD  LANTUS SOLOSTAR 100 UNIT/ML Solostar Pen Inject 15 Units into the skin at bedtime. 02/20/21   Fritzi Mandes, MD  levothyroxine (SYNTHROID) 75 MCG tablet Take 1 tablet (75 mcg total) by mouth daily before breakfast. 02/08/20   Lorella Nimrod, MD  lisinopril (ZESTRIL) 10 MG tablet Take 1 tablet (10 mg total) by mouth daily. 02/20/21   Fritzi Mandes, MD  memantine (NAMENDA XR) 28 MG CP24 24 hr capsule Take 28 mg by mouth daily. 02/12/21   [provider]  metoprolol succinate (TOPROL-XL) 50 MG 24 hr tablet Take 50 mg by mouth daily. Take with or immediately following a meal.    [provider]  montelukast (SINGULAIR) 10 MG tablet Take 10 mg by mouth every evening. 12/05/20   [provider]  nitrofurantoin (MACRODANTIN) 100 MG capsule Take 1 capsule (100 mg total) by mouth 2 (two) times daily for 7 days. 03/26/21 04/02/21  Bjorn Loser, MD  nystatin (MYCOSTATIN/NYSTOP) powder Apply 1 application topically 2 (two) times daily.    [provider]  oxybutynin (DITROPAN-XL) 5 MG 24 hr tablet Take 5 mg by mouth daily.     [provider]  oxyCODONE-acetaminophen (PERCOCET) 10-325 MG tablet Take 1 tablet by mouth every 6 (six) hours as needed for pain. 02/10/21 02/10/22  Marvis Moeller, NP  pantoprazole (PROTONIX) 40 MG tablet Take 40 mg by mouth daily.    [provider]  QUEtiapine (SEROQUEL) 300 MG tablet Take 150-300 mg by mouth at bedtime. 01/20/20   [provider]  topiramate (TOPAMAX) 200 MG tablet Take 200 mg by mouth at bedtime. 12/22/20   [provider]  trimethoprim (TRIMPEX) 100 MG tablet Take 1 tablet (100 mg total) by mouth daily. 03/19/21   MacDiarmid, Nicki Reaper, MD  VIIBRYD 40 MG TABS Take 40 mg by mouth daily. 02/01/21   [provider]    Allergies Patient has no known allergies.  Family History  Problem Relation Age of Onset  . Cancer Mother   . Cancer Father   . Cancer Sister   . Thyroid disease Neg Hx     Social History Social History   Tobacco Use  . Smoking status: Never Smoker  . Smokeless tobacco: Never Used  Vaping Use  . Vaping Use: Never used  Substance Use Topics  . Alcohol use: Never  . Drug use: Never      Review of Systems Constitutional: No fever/chills Eyes: No visual changes. ENT: No sore throat. Cardiovascular: Right chest wall pain Respiratory: Denies shortness of breath. Gastrointestinal: No abdominal pain.  No nausea, no vomiting.  No diarrhea.  No constipation. Genitourinary: Negative for dysuria. Musculoskeletal: Negative for back pain. Skin: Negative for rash. Neurological: Positive headache, no focal weakness or numbness. All other ROS negative ____________________________________________   PHYSICAL EXAM:  VITAL SIGNS: ED Triage Vitals  Enc Vitals Group     BP 03/29/21 0220 (!) 70/38     Pulse Rate 03/29/21 0210 78     Resp 03/29/21 0210 20     Temp 03/29/21 0210 98.3 F (36.8 C)     Temp Source 03/29/21 0210 Oral     SpO2 03/29/21 0210 98 %     Weight 03/29/21 0223 250 lb (113.4 kg)      Height 03/29/21 0223 5\' 3"  (1.6 m)     Head Circumference --      Peak Flow --      Pain Score 03/29/21 0217 10     Pain Loc --      Pain Edu? --      Excl. in [place]? --     Constitutional: Alert and oriented. GCS 15  Eyes: Conjunctivae are normal. EOMI. Head: Atraumatic. Nose: No congestion/rhinnorhea. Mouth/Throat: Mucous membranes are moist.   Neck: No stridor. Trachea Midline. FROM Cardiovascular: Normal rate, regular rhythm. Grossly normal heart sounds.  Good peripheral circulation.  Right chest wall tenderness Respiratory: Normal respiratory effort.  No retractions. Lungs CTAB. Gastrointestinal: Soft and nontender. No distention. No abdominal bruits.  Musculoskeletal:   RUE: No point tenderness, deformity or other signs of injury. Radial pulse intact. Neuro intact. Full ROM in joint. [name]: No point tenderness, deformity or other signs of injury. Radial pulse intact. Neuro intact. Full ROM in joints RLE: Abrasion noted to the right knee, no deformity or other signs of injury. DP pulse intact. Neuro intact. Full ROM in joints. LLE: No point tenderness, deformity or other signs of injury. DP pulse intact. Neuro intact. Full ROM in joints. Neurologic:  Normal speech and language. No gross focal neurologic deficits are appreciated.  Equal grip strength.  Able to lift both legs up off the bed Skin:  Skin is warm, dry and intact. No rash noted. Psychiatric: Mood and affect are normal. Speech and behavior are normal. GU: Deferred   ____________________________________________   LABS (all labs ordered are listed, but only abnormal results are displayed)  Labs Reviewed  CBG MONITORING, ED - Abnormal; Notable for the following components:      Result Value   Glucose-Capillary 218 (*)    All other components within normal limits  RESP PANEL BY RT-PCR (FLU A&B, COVID) ARPGX2  CBC WITH DIFFERENTIAL/PLATELET  COMPREHENSIVE METABOLIC PANEL  URINALYSIS, COMPLETE (UACMP) WITH MICROSCOPIC   CK  TROPONIN I (HIGH SENSITIVITY)   ____________________________________________   ED ECG REPORT I, Vanessa [place], the attending physician, personally viewed and interpreted this ECG.  Normal sinus rate 74, no ST  elevation, no T wave inversions, normal intervals ____________________________________________  RADIOLOGY   Official radiology report(s): CT Head Wo Contrast  Result Date: 03/29/2021 CLINICAL DATA:  Nonlocalized acute abdominal pain. Status post fall out of bed. Status post lumbar interbody fusion on for 02/09/21 EXAM: CT HEAD WITHOUT CONTRAST CT CERVICAL SPINE WITHOUT CONTRAST CT CHEST, ABDOMEN AND PELVIS WITHOUT CONTRAST TECHNIQUE: Multidetector CT imaging of the head and cervical spine was performed following the standard protocol without intravenous contrast. Multiplanar CT image reconstructions of the cervical spine were also generated. Multidetector CT imaging of the chest, abdomen and pelvis was performed following the standard protocol without IV contrast. COMPARISON:  CT head C-spine 06/12/2020, CT head C-spine 03/06/2020 CT angio chest 02/06/2020, MRI lumbar spine 08/31/2020, CT renal 03/06/2020 FINDINGS: CT HEAD FINDINGS Brain: No evidence of large-territorial acute infarction. No parenchymal hemorrhage. No mass lesion. No extra-axial collection. No mass effect or midline shift. No hydrocephalus. Basilar cisterns are patent. Vascular: No hyperdense vessel. Skull: No acute fracture or focal lesion. Sinuses/Orbits: Paranasal sinuses are clear. Trace fluid within bilateral mastoid air cells. No fluid within middle ears. Bilateral lens replacement. Otherwise the orbits are unremarkable. Other: None. CT CERVICAL SPINE FINDINGS Alignment: Normal. Skull base and vertebrae: No acute fracture. No aggressive appearing focal osseous lesion or focal pathologic process. Soft tissues and spinal canal: No prevertebral fluid or swelling. No visible canal hematoma. Upper chest: Unremarkable.  Other: None. CT CHEST: Ports and Devices: None. Lungs/airways: Expiratory phase of respiration bilateral lower lobe subsegmental atelectasis. No focal consolidation. No pulmonary nodule. No pulmonary mass. No pulmonary contusion or laceration. No pneumatocele formation. The central airways are patent. Pleura: No pleural effusion. No pneumothorax. No hemothorax. Lymph Nodes: Calcified left hilar lymph nodes. Limited evaluation for hilar lymphadenopathy on this noncontrast study. No mediastinal or axillary lymphadenopathy. Mediastinum: No pneumomediastinum. The thoracic aorta is normal in caliber. The heart is normal in size. Trace pericardial effusion. Four-vessel coronary artery calcifications. Mild to moderate atherosclerotic plaque. The esophagus is unremarkable. The thyroid is unremarkable. Chest Wall / Breasts: No chest wall mass. Musculoskeletal: No acute rib or sternal fracture. Acute fracture of the T12 vertebral body with extension posteriorly to the left articular facet. CT ABDOMEN / PELVIS: Liver: Not enlarged. No focal lesion. Biliary System: The gallbladder is otherwise unremarkable with no radio-opaque gallstones. No biliary ductal dilatation. Pancreas: Normal pancreatic contour. No main pancreatic duct dilatation. Spleen: Not enlarged. No focal lesion. Adrenal Glands: No nodularity bilaterally. Kidneys: No hydroureteronephrosis. No nephroureterolithiasis. No contour deforming renal mass. The urinary bladder is unremarkable. Bowel: No small or large bowel wall thickening or dilatation. Stool throughout the colon. The appendix is unremarkable. Mesentery, Omentum, and Peritoneum: No simple free fluid ascites. No pneumoperitoneum. No mesenteric hematoma identified. No organized fluid collection. Pelvic Organs: Normal. Lymph Nodes: No abdominal, pelvic, inguinal lymphadenopathy. Vasculature: Atherosclerotic plaque. No abdominal aorta or iliac aneurysm. Musculoskeletal: No acute pelvic fracture.  Redemonstration of a chronic L1 compression fracture. L4-S1 posterolateral fusion as well as interbody fusion with interval development of associated cortical erosion and destruction of the anterior and posterior elements of the surgerized levels. Question superimposed age-indeterminate fracture of the S1 vertebral body (6:125). There is an overlying 6.7 x 6 x 9 cm organized fluid collection within the soft tissues overlying the surgerized levels. The fluid collection is noted to abut the surgical hardware as well as surgerized posterior elements. IMPRESSION: 1. No acute intracranial abnormality. 2. No acute displaced fracture or traumatic listhesis of the cervical spine. 3.  No  acute traumatic injury to the chest, abdomen, or pelvis. 4. Acute fracture of the T12 vertebral body with extension posteriorly to the left articular facet. 5. L4-S1 posterior fusion with interval development of associated cortical erosion and destruction of the surgerized levels concerning for osteomyelitis. Question superimposed age-indeterminate fracture of the S1 vertebral body. 6. Associated overlying 6.7 x 6 x 9 cm organized fluid collection within the soft tissues overlying the surgerized levels. The fluid collection is noted to abut the surgical hardware as well as surgerized posterior elements. Limited evaluation on this noncontrast with differential diagnosis including an abscess formation versus seroma. 7. Chronic L1 compression fracture. 8. Aortic Atherosclerosis (ICD10-I70.0) including four-vessel coronary artery calcifications. These results were called by telephone at the time of interpretation on 03/29/2021 at 4:29 am to provider [facility] , who verbally acknowledged these results. Electronically Signed   By: Iven Finn M.D.   On: 03/29/2021 04:31   CT Cervical Spine Wo Contrast  Result Date: 03/29/2021 CLINICAL DATA:  Nonlocalized acute abdominal pain. Status post fall out of bed. Status post lumbar interbody fusion  on for 02/09/21 EXAM: CT HEAD WITHOUT CONTRAST CT CERVICAL SPINE WITHOUT CONTRAST CT CHEST, ABDOMEN AND PELVIS WITHOUT CONTRAST TECHNIQUE: Multidetector CT imaging of the head and cervical spine was performed following the standard protocol without intravenous contrast. Multiplanar CT image reconstructions of the cervical spine were also generated. Multidetector CT imaging of the chest, abdomen and pelvis was performed following the standard protocol without IV contrast. COMPARISON:  CT head C-spine 06/12/2020, CT head C-spine 03/06/2020 CT angio chest 02/06/2020, MRI lumbar spine 08/31/2020, CT renal 03/06/2020 FINDINGS: CT HEAD FINDINGS Brain: No evidence of large-territorial acute infarction. No parenchymal hemorrhage. No mass lesion. No extra-axial collection. No mass effect or midline shift. No hydrocephalus. Basilar cisterns are patent. Vascular: No hyperdense vessel. Skull: No acute fracture or focal lesion. Sinuses/Orbits: Paranasal sinuses are clear. Trace fluid within bilateral mastoid air cells. No fluid within middle ears. Bilateral lens replacement. Otherwise the orbits are unremarkable. Other: None. CT CERVICAL SPINE FINDINGS Alignment: Normal. Skull base and vertebrae: No acute fracture. No aggressive appearing focal osseous lesion or focal pathologic process. Soft tissues and spinal canal: No prevertebral fluid or swelling. No visible canal hematoma. Upper chest: Unremarkable. Other: None. CT CHEST: Ports and Devices: None. Lungs/airways: Expiratory phase of respiration bilateral lower lobe subsegmental atelectasis. No focal consolidation. No pulmonary nodule. No pulmonary mass. No pulmonary contusion or laceration. No pneumatocele formation. The central airways are patent. Pleura: No pleural effusion. No pneumothorax. No hemothorax. Lymph Nodes: Calcified left hilar lymph nodes. Limited evaluation for hilar lymphadenopathy on this noncontrast study. No mediastinal or axillary lymphadenopathy.  Mediastinum: No pneumomediastinum. The thoracic aorta is normal in caliber. The heart is normal in size. Trace pericardial effusion. Four-vessel coronary artery calcifications. Mild to moderate atherosclerotic plaque. The esophagus is unremarkable. The thyroid is unremarkable. Chest Wall / Breasts: No chest wall mass. Musculoskeletal: No acute rib or sternal fracture. Acute fracture of the T12 vertebral body with extension posteriorly to the left articular facet. CT ABDOMEN / PELVIS: Liver: Not enlarged. No focal lesion. Biliary System: The gallbladder is otherwise unremarkable with no radio-opaque gallstones. No biliary ductal dilatation. Pancreas: Normal pancreatic contour. No main pancreatic duct dilatation. Spleen: Not enlarged. No focal lesion. Adrenal Glands: No nodularity bilaterally. Kidneys: No hydroureteronephrosis. No nephroureterolithiasis. No contour deforming renal mass. The urinary bladder is unremarkable. Bowel: No small or large bowel wall thickening or dilatation. Stool throughout the colon. The appendix is  unremarkable. Mesentery, Omentum, and Peritoneum: No simple free fluid ascites. No pneumoperitoneum. No mesenteric hematoma identified. No organized fluid collection. Pelvic Organs: Normal. Lymph Nodes: No abdominal, pelvic, inguinal lymphadenopathy. Vasculature: Atherosclerotic plaque. No abdominal aorta or iliac aneurysm. Musculoskeletal: No acute pelvic fracture. Redemonstration of a chronic L1 compression fracture. L4-S1 posterolateral fusion as well as interbody fusion with interval development of associated cortical erosion and destruction of the anterior and posterior elements of the surgerized levels. Question superimposed age-indeterminate fracture of the S1 vertebral body (6:125). There is an overlying 6.7 x 6 x 9 cm organized fluid collection within the soft tissues overlying the surgerized levels. The fluid collection is noted to abut the surgical hardware as well as surgerized  posterior elements. IMPRESSION: 1. No acute intracranial abnormality. 2. No acute displaced fracture or traumatic listhesis of the cervical spine. 3.  No acute traumatic injury to the chest, abdomen, or pelvis. 4. Acute fracture of the T12 vertebral body with extension posteriorly to the left articular facet. 5. L4-S1 posterior fusion with interval development of associated cortical erosion and destruction of the surgerized levels concerning for osteomyelitis. Question superimposed age-indeterminate fracture of the S1 vertebral body. 6. Associated overlying 6.7 x 6 x 9 cm organized fluid collection within the soft tissues overlying the surgerized levels. The fluid collection is noted to abut the surgical hardware as well as surgerized posterior elements. Limited evaluation on this noncontrast with differential diagnosis including an abscess formation versus seroma. 7. Chronic L1 compression fracture. 8. Aortic Atherosclerosis (ICD10-I70.0) including four-vessel coronary artery calcifications. These results were called by telephone at the time of interpretation on 03/29/2021 at 4:29 am to provider [facility] , who verbally acknowledged these results. Electronically Signed   By: Iven Finn M.D.   On: 03/29/2021 04:31   DG Knee Complete 4 Views Right  Result Date: 03/29/2021 CLINICAL DATA:  Knee pain. Bruising and abrasions to the right knee. EXAM: RIGHT KNEE - COMPLETE 4+ VIEW COMPARISON:  X-ray right knee 06/30/2016 FINDINGS: Total right knee arthroplasty appear no evidence of fracture, dislocation, or joint effusion. No evidence of arthropathy or other focal bone abnormality. Subcutaneus soft tissue edema. Likely dermal calcifications. IMPRESSION: 1. No acute displaced fracture or dislocation. 2. Subcutaneus soft tissue edema. Electronically Signed   By: Iven Finn M.D.   On: 03/29/2021 03:35   CT CHEST ABDOMEN PELVIS WO CONTRAST  Result Date: 03/29/2021 CLINICAL DATA:  Nonlocalized acute abdominal  pain. Status post fall out of bed. Status post lumbar interbody fusion on for 02/09/21 EXAM: CT HEAD WITHOUT CONTRAST CT CERVICAL SPINE WITHOUT CONTRAST CT CHEST, ABDOMEN AND PELVIS WITHOUT CONTRAST TECHNIQUE: Multidetector CT imaging of the head and cervical spine was performed following the standard protocol without intravenous contrast. Multiplanar CT image reconstructions of the cervical spine were also generated. Multidetector CT imaging of the chest, abdomen and pelvis was performed following the standard protocol without IV contrast. COMPARISON:  CT head C-spine 06/12/2020, CT head C-spine 03/06/2020 CT angio chest 02/06/2020, MRI lumbar spine 08/31/2020, CT renal 03/06/2020 FINDINGS: CT HEAD FINDINGS Brain: No evidence of large-territorial acute infarction. No parenchymal hemorrhage. No mass lesion. No extra-axial collection. No mass effect or midline shift. No hydrocephalus. Basilar cisterns are patent. Vascular: No hyperdense vessel. Skull: No acute fracture or focal lesion. Sinuses/Orbits: Paranasal sinuses are clear. Trace fluid within bilateral mastoid air cells. No fluid within middle ears. Bilateral lens replacement. Otherwise the orbits are unremarkable. Other: None. CT CERVICAL SPINE FINDINGS Alignment: Normal. Skull base and vertebrae: No  acute fracture. No aggressive appearing focal osseous lesion or focal pathologic process. Soft tissues and spinal canal: No prevertebral fluid or swelling. No visible canal hematoma. Upper chest: Unremarkable. Other: None. CT CHEST: Ports and Devices: None. Lungs/airways: Expiratory phase of respiration bilateral lower lobe subsegmental atelectasis. No focal consolidation. No pulmonary nodule. No pulmonary mass. No pulmonary contusion or laceration. No pneumatocele formation. The central airways are patent. Pleura: No pleural effusion. No pneumothorax. No hemothorax. Lymph Nodes: Calcified left hilar lymph nodes. Limited evaluation for hilar lymphadenopathy on this  noncontrast study. No mediastinal or axillary lymphadenopathy. Mediastinum: No pneumomediastinum. The thoracic aorta is normal in caliber. The heart is normal in size. Trace pericardial effusion. Four-vessel coronary artery calcifications. Mild to moderate atherosclerotic plaque. The esophagus is unremarkable. The thyroid is unremarkable. Chest Wall / Breasts: No chest wall mass. Musculoskeletal: No acute rib or sternal fracture. Acute fracture of the T12 vertebral body with extension posteriorly to the left articular facet. CT ABDOMEN / PELVIS: Liver: Not enlarged. No focal lesion. Biliary System: The gallbladder is otherwise unremarkable with no radio-opaque gallstones. No biliary ductal dilatation. Pancreas: Normal pancreatic contour. No main pancreatic duct dilatation. Spleen: Not enlarged. No focal lesion. Adrenal Glands: No nodularity bilaterally. Kidneys: No hydroureteronephrosis. No nephroureterolithiasis. No contour deforming renal mass. The urinary bladder is unremarkable. Bowel: No small or large bowel wall thickening or dilatation. Stool throughout the colon. The appendix is unremarkable. Mesentery, Omentum, and Peritoneum: No simple free fluid ascites. No pneumoperitoneum. No mesenteric hematoma identified. No organized fluid collection. Pelvic Organs: Normal. Lymph Nodes: No abdominal, pelvic, inguinal lymphadenopathy. Vasculature: Atherosclerotic plaque. No abdominal aorta or iliac aneurysm. Musculoskeletal: No acute pelvic fracture. Redemonstration of a chronic L1 compression fracture. L4-S1 posterolateral fusion as well as interbody fusion with interval development of associated cortical erosion and destruction of the anterior and posterior elements of the surgerized levels. Question superimposed age-indeterminate fracture of the S1 vertebral body (6:125). There is an overlying 6.7 x 6 x 9 cm organized fluid collection within the soft tissues overlying the surgerized levels. The fluid collection is  noted to abut the surgical hardware as well as surgerized posterior elements. IMPRESSION: 1. No acute intracranial abnormality. 2. No acute displaced fracture or traumatic listhesis of the cervical spine. 3.  No acute traumatic injury to the chest, abdomen, or pelvis. 4. Acute fracture of the T12 vertebral body with extension posteriorly to the left articular facet. 5. L4-S1 posterior fusion with interval development of associated cortical erosion and destruction of the surgerized levels concerning for osteomyelitis. Question superimposed age-indeterminate fracture of the S1 vertebral body. 6. Associated overlying 6.7 x 6 x 9 cm organized fluid collection within the soft tissues overlying the surgerized levels. The fluid collection is noted to abut the surgical hardware as well as surgerized posterior elements. Limited evaluation on this noncontrast with differential diagnosis including an abscess formation versus seroma. 7. Chronic L1 compression fracture. 8. Aortic Atherosclerosis (ICD10-I70.0) including four-vessel coronary artery calcifications. These results were called by telephone at the time of interpretation on 03/29/2021 at 4:29 am to provider [facility] , who verbally acknowledged these results. Electronically Signed   By: Iven Finn M.D.   On: 03/29/2021 04:31    ____________________________________________   PROCEDURES  Procedure(s) performed (including Critical Care):  .1-3 Lead EKG Interpretation Performed by: Vanessa [place], MD Authorized by: Vanessa [place], MD     Interpretation: normal     ECG rate:  70s    ECG rate assessment: normal  Rhythm: sinus rhythm     Ectopy: none     Conduction: normal       ____________________________________________   INITIAL IMPRESSION / ASSESSMENT AND PLAN / ED COURSE    [name] was evaluated in Emergency Department on 03/29/2021 for the symptoms described in the history of present illness. She was evaluated in the context  of the global COVID-19 pandemic, which necessitated consideration that the patient might be at risk for infection with the SARS-CoV-2 virus that causes COVID-19. Institutional protocols and algorithms that pertain to the evaluation of patients at risk for COVID-19 are in a state of rapid change based on information released by regulatory bodies including the CDC and federal and state organizations. These policies and algorithms were followed during the patient's care in the ED.    Immediate Considerations in trauma patient: Patient is initially hypotensive we will give 1 L of fluid as well as some low oxygen levels of 90%.  Will place on 2 L.  Patient will need pan scan to further evaluate  Head Injury Airway compromise/injury Chest Injury and Abdominal Injury - including hemo/pneumothorax, cardiac, abdominal solid and hollow organ injury Spinal Cord or Vertebral injury Vascular compromise/injury Fractures  Discussed with radiology who is concerned about a T12 fracture and a L4 /S1 fracture but also concern for possible osteomyelitis from her recent surgery.  Will give patient some Dilaudid to help facilitate imaging.  Given patient is followed by [facility] will discuss with them further but will order the MRIs while awaiting their recommendations.  Discussed with Maudie Mercury the PA who accepted patient by Dr. Trenton Gammon for transfer.  MRIs were ordered and are pending.         ____________________________________________   FINAL CLINICAL IMPRESSION(S) / ED DIAGNOSES   Final diagnoses:  Fall  Closed fracture of twelfth thoracic vertebra, unspecified fracture morphology, initial encounter [facility])      MEDICATIONS GIVEN DURING THIS VISIT:  Medications  sodium chloride 0.9 % bolus 1,000 mL (0 mLs Intravenous Stopped 03/29/21 0408)  Tdap (BOOSTRIX) injection 0.5 mL (0.5 mLs Intramuscular Given 03/29/21 0406)  sodium chloride 0.9 % bolus 1,000 mL (0 mLs Intravenous Stopped 03/29/21  0539)  acetaminophen (TYLENOL) tablet 1,000 mg (1,000 mg Oral Given 03/29/21 0412)  HYDROmorphone (DILAUDID) injection 0.5 mg (0.5 mg Intravenous Given 03/29/21 0450)     ED Discharge Orders    None       Note:  This document was prepared using Dragon voice recognition software and may include unintentional dictation errors.   Vanessa [place], MD 03/29/21 [phone]

## 2021-03-29 NOTE — ED Notes (Signed)
Pt in ct 

## 2021-03-30 ENCOUNTER — Ambulatory Visit: Payer: Medicare Other | Admitting: Podiatry

## 2021-03-30 LAB — GLUCOSE, CAPILLARY: Glucose-Capillary: 345 mg/dL — ABNORMAL HIGH (ref 70–99)

## 2021-03-30 MED ORDER — HEPARIN SODIUM (PORCINE) 5000 UNIT/ML IJ SOLN
5000.0000 [IU] | Freq: Three times a day (TID) | INTRAMUSCULAR | Status: DC
  Administered 2021-03-30 – 2021-04-04 (×15): 5000 [IU] via SUBCUTANEOUS
  Filled 2021-03-30 (×15): qty 1

## 2021-03-30 MED ORDER — METHOCARBAMOL 750 MG PO TABS
750.0000 mg | ORAL_TABLET | Freq: Four times a day (QID) | ORAL | Status: DC
  Administered 2021-03-30 – 2021-04-04 (×17): 750 mg via ORAL
  Filled 2021-03-30 (×15): qty 1

## 2021-03-30 MED ORDER — ENSURE ENLIVE PO LIQD
237.0000 mL | Freq: Two times a day (BID) | ORAL | Status: DC
  Administered 2021-03-30 – 2021-04-04 (×5): 237 mL via ORAL
  Filled 2021-03-30 (×2): qty 237

## 2021-03-30 NOTE — Evaluation (Signed)
Physical Therapy Evaluation Patient Details Name: [name] MRN: [id number] DOB: 04-Jul-1946 Today's Date: 03/30/2021   History of Present Illness  75 y/o s/p fall from bed with intractable back pain and second fall noted in chart from falling in mud puddle striking head against car on 5/23. T12 acute compression fx with mild loss height, multiple TP fx R T2-8 L5 with orders for TLSO PMH s/p PLIF L5-S1 on 4/8. anxiety, CHF, CKD, chronic pain syndrome, depression, DM type II, GERD, HTN, HLD, IBS, seizure, sleep apnea    Clinical Impression  Pt in bed upon arrival of PT, agreeable to evaluation at this time. Prior to admission the pt was mobilizing with use of RW or SPC, but also reports she has been more or les bed bound due to pain for 4 days prior to admission. The pt also reports assist from family for ADLs. The pt now presents with limitations in functional mobility, strength, stability, and activity tolerance due to above dx and resulting pain, and will continue to benefit from skilled PT to address these deficits. The pt was agreeable to attempt mobility despite pain, but required increased assist to manage all bed mobility and intermittently to maintain static sitting due to severe pain. Was unable to don brace due to poor fit once sitting EOB, and therefore deferred further OOB mobility until new brace could be provided. Pt tolerated sitting EOB for 3-5 min while attempting to fit brace, required assist of 2 to safely return to bed and reposition at this time. Will continue to forllow and progress OOB mobility as able, will likely need SNF level rehab following d/c due to significant deconditioning and pt statements that they prefer not to have providers enter the home.      Follow Up Recommendations Supervision/Assistance - 24 hour;SNF (may be updated when able to progress to OOB mobility)    Equipment Recommendations  Wheelchair (measurements PT);Wheelchair cushion (measurements PT)     Recommendations for Other Services       Precautions / Restrictions Precautions Precautions: Fall Precaution Comments: back precautions for comfort Required Braces or Orthoses: Spinal Brace Spinal Brace: Thoracolumbosacral orthotic;Other (comment) Spinal Brace Comments: clamshell when OOB Restrictions Weight Bearing Restrictions: No      Mobility  Bed Mobility Overal bed mobility: Needs Assistance Bed Mobility: Rolling;Sidelying to Sit;Sit to Sidelying Rolling: Mod assist Sidelying to sit: Mod assist;+2 for physical assistance;HOB elevated     Sit to sidelying: Min assist;+2 for physical assistance;HOB elevated General bed mobility comments: modA with repeated verbal cues to maintain spine in neutral position with rolling to reduce pain, pt internally distracted by pain and needing increased assist at times. heavy use of bed rails and HOB elevated, still requiring modA to complete transition as well as sequential cueing for technique    Transfers                 General transfer comment: deferred due to ill-fitting brace, unable to don despite attempts to adjust, will progress OOB mobility once appropriately sized brace is available.     Balance Overall balance assessment: Needs assistance Sitting-balance support: Bilateral upper extremity supported Sitting balance-Leahy Scale: Poor Sitting balance - Comments: pt able to maintain for short bits without UE support, limited due to pain                                     Pertinent Vitals/Pain Pain  Assessment: Faces Faces Pain Scale: Hurts whole lot Pain Location: low back and hip with upright position and changes in position Pain Descriptors / Indicators: Spasm Pain Intervention(s): Limited activity within patient's tolerance;Monitored during session;Repositioned    Home Living Family/patient expects to be discharged to:: Private residence Living Arrangements: Children Available Help at  Discharge: Available 24 hours/day Type of Home: House Home Access: Level entry     Home Layout: One level Home Equipment: Environmental consultant - 2 wheels;Walker - 4 wheels Additional Comments: Pt reports taking sink bath with daughter's assistance    Prior Function Level of Independence: Needs assistance   Gait / Transfers Assistance Needed: with chart review reports 2 falls prior to april 2022 and now reports two falls between admissions so 4 total in last 6 months noted. using SPC and rollator, reports bed bound since fall due to back pain (4 days)  ADL's / Homemaking Assistance Needed: needs assistance from daughter for sink baths        Hand Dominance   Dominant Hand: Right    Extremity/Trunk Assessment   Upper Extremity Assessment Upper Extremity Assessment: Defer to OT evaluation;Overall [facility] for tasks assessed    Lower Extremity Assessment Lower Extremity Assessment: Generalized weakness (pt reports no changes in LE sensation, history of neuropathy bilaterally at baseline (distal calf and foot). full AROM against gravity, grossly 4/5)    Cervical / Trunk Assessment Cervical / Trunk Assessment: Kyphotic  Communication   Communication: No difficulties  Cognition Arousal/Alertness: Awake/alert Behavior During Therapy: WFL for tasks assessed/performed Overall Cognitive Status: No family/caregiver present to determine baseline cognitive functioning Area of Impairment: Memory;Problem solving                     Memory: Decreased recall of precautions       Problem Solving: Slow processing;Decreased initiation;Requires verbal cues General Comments: repeated verbal cues for technique, safety, and mobility to maintain precautions and minimize pain. Pt seemingly internally distracted by pain at times, but able to recall information about home set up and events leading up to hospitalization accurately      General Comments General comments (skin integrity, edema, etc.): unable  to don brace present in patient room. RN and hanger notified. SpO2 dropped to 87% on RA with bed mobility, returned to 1L O2 with SpO2 at 95% at rest    Exercises     Assessment/Plan    PT Assessment Patient needs continued PT services  PT Problem List Decreased strength;Decreased range of motion;Decreased activity tolerance;Decreased balance;Pain       PT Treatment Interventions Gait training;DME instruction;Stair training;Functional mobility training;Therapeutic activities;Therapeutic exercise;Balance training;Patient/family education    PT Goals (Current goals can be found in the Care Plan section)  Acute Rehab PT Goals Patient Stated Goal: reduce pain PT Goal Formulation: With patient Time For Goal Achievement: 04/13/21 Potential to Achieve Goals: Fair    Frequency Min 3X/week   Barriers to discharge        Co-evaluation PT/OT/SLP Co-Evaluation/Treatment: Yes Reason for Co-Treatment: Complexity of the patient's impairments (multi-system involvement);For patient/therapist safety;To address functional/ADL transfers PT goals addressed during session: Mobility/safety with mobility;Balance;Strengthening/ROM         AM-PAC PT "6 Clicks" Mobility  Outcome Measure Help needed turning from your back to your side while in a flat bed without using bedrails?: A Lot Help needed moving from lying on your back to sitting on the side of a flat bed without using bedrails?: A Lot Help needed moving to and from  a bed to a chair (including a wheelchair)?: A Lot Help needed standing up from a chair using your arms (e.g., wheelchair or bedside chair)?: A Lot Help needed to walk in hospital room?: A Lot Help needed climbing 3-5 steps with a railing? : A Lot 6 Click Score: 12    End of Session Equipment Utilized During Treatment: Gait belt;Oxygen Activity Tolerance: Patient limited by pain;Other (comment) (ill-fitting brace) Patient left: in bed;with bed alarm set Nurse Communication:  Mobility status;Other (comment) (need for new brace) PT Visit Diagnosis: Other abnormalities of gait and mobility (R26.89);Pain Pain - part of body:  (back)    Time: 1209-1232 PT Time Calculation (min) (ACUTE ONLY): 23 min   Charges:   PT Evaluation $PT Eval Moderate Complexity: 1 Mod          Karma Ganja, PT, DPT   Acute Rehabilitation Department Pager #: [phone]  Otho Bellows 03/30/2021, 1:59 PM

## 2021-03-30 NOTE — Progress Notes (Signed)
   Providing Compassionate, Quality Care - Together  NEUROSURGERY PROGRESS NOTE   S: No issues overnight. Still complains of spasms  O: EXAM:  BP (!) 144/64 (BP Location: Right Arm)   Pulse (!) 109   Temp 98 F (36.7 C) (Oral)   Resp 16   SpO2 96%   Awake, alert, oriented PERRL  Speech fluent, appropriate  CNs grossly intact  5/5 BUE/BLE   ASSESSMENT:  75 y.o. female with   1. T12 acute compression fracture with mild loss of height 2.  Multiple TP fractures, right, T2-8, L5 3.  History of L4-S1 fusion  PLAN: - TLSO pending - PT/OT once brace is on - pain control - dc planning once pain controlled and brace received    Thank you for allowing me to participate in this patient's care.  Please do not hesitate to call with questions or concerns.   Elwin Sleight, [facility] Cell: [phone]

## 2021-03-30 NOTE — Progress Notes (Signed)
Orthopedic Tech Progress Note Patient Details:  [name] 07-05-1946 [id number] Called in order to [facility] Patient ID: [name], female   DOB: 09/23/1946, 75 y.o.   MRN: [id number]   [name] 03/30/2021, 8:45 AM

## 2021-03-30 NOTE — NC FL2 (Signed)
[place] LEVEL OF CARE SCREENING TOOL     IDENTIFICATION  Patient Name: [name] Birthdate: May 31, 1946 Sex: female Admission Date (Current Location): 03/29/2021  [facility] and [place] Number:  Herbalist and Address:         Provider Number: [id number]  Attending Physician Name and Address:  Dawley, Theodoro Doing, DO  Relative Name and Phone Number:       Current Level of Care: Hospital Recommended Level of Care: [place] Prior Approval Number:    Date Approved/Denied:   PASRR Number: [id number]  Discharge Plan: SNF    Current Diagnoses: Patient Active Problem List   Diagnosis Date Noted  . Fracture 03/29/2021  . Status post lumbar spinal fusion 02/22/2021  . Anemia, blood loss   . Hypothyroidism   . AMS (altered mental status) 02/15/2021  . AMS (altered mental status) 02/15/2021  . Lumbar foraminal stenosis 02/09/2021  . Urinary tract infection 09/12/2020  . Acidosis 08/22/2020  . Acute kidney injury superimposed on CKD ([place]) 03/06/2020  . Hyperkalemia 03/06/2020  . Acute metabolic encephalopathy 42/70/6237  . Acute respiratory failure with hypoxia ([place]) 02/06/2020  . OSA (obstructive sleep apnea) 02/06/2020  . CPAP ventilation treatment not tolerated 02/06/2020  . Morbid obesity with BMI of 45.0-49.9, adult ([employer]) 02/06/2020  . Lactic acidosis 02/06/2020  . possible Sepsis ([place]) 02/06/2020  . Polypharmacy 02/06/2020  . CAD (coronary artery disease) 05/05/2018  . Preop cardiovascular exam 04/08/2018  . Microscopic hematuria 09/17/2016  . Cystitis, chronic 09/17/2016  . 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 Closed fracture of metatarsal bone 07/22/2016  . Plantar fasciitis, left 05/28/2016  . Degenerative spondylolisthesis 01/02/2016  . Tachycardia 01/02/2016  . Type 2 diabetes mellitus with hyperlipidemia ([place])   . Essential hypertension   . Cardiomyopathy ([place]) 10/06/2015  . Heart palpitations  10/06/2015  . SOB (shortness of breath) on exertion 10/06/2015  . Hematuria, unspecified 01/25/2015  . Incontinence of urine in female 01/25/2015  . Non-toxic multinodular goiter 10/11/2014  . Diabetes mellitus ([place]) 10/11/2014  . Hyperlipidemia 10/11/2014  . Depression 10/11/2014  . Osteoporosis 10/11/2014  . GERD (gastroesophageal reflux disease) 10/11/2014  . Arthritis 10/11/2014  . Cystocele 10/11/2014  . Chronic pain syndrome 10/11/2014  . Amnesia 10/11/2014  . Back pain 10/11/2014  . Anxiety state 04/10/2013  . Major depressive disorder, recurrent episode ([place]) 04/10/2013  . Convulsions ([place]) 05/29/2012    Orientation RESPIRATION BLADDER Height & Weight     Time,Self,Situation,Place  Normal External catheter,Continent Weight:   Height:     BEHAVIORAL SYMPTOMS/MOOD NEUROLOGICAL BOWEL NUTRITION STATUS      Continent Diet (please see discharge summary)  AMBULATORY STATUS COMMUNICATION OF NEEDS Skin   Supervision Verbally Normal                       Personal Care Assistance Level of Assistance  Bathing,Feeding,Dressing Bathing Assistance: Limited assistance Feeding assistance: Independent Dressing Assistance: Limited assistance     Functional Limitations Info  Sight,Hearing,Speech Sight Info: Adequate Hearing Info: Adequate Speech Info: Adequate    SPECIAL CARE FACTORS FREQUENCY  PT (By licensed PT),OT (By licensed OT)     PT Frequency: 5x per week OT Frequency: 5x per week            Contractures Contractures Info: Not present    Additional Factors Info  Code Status,Allergies,Psychotropic Code Status Info: FULL Allergies Info: NKA Psychotropic Info: DULoxetine (CYMBALTA) DR capsule 60 mg,QUEtiapine (SEROQUEL)  tablet 200 mg,zolpidem (AMBIEN) tablet 5 mg         Current Medications (03/30/2021):  This is the current hospital active medication list Current Facility-Administered Medications  Medication Dose Route Frequency Provider Last Rate  Last Admin  . 0.9 %  sodium chloride infusion  250 mL Intravenous PRN Dawley, [place] C, DO      . atorvastatin (LIPITOR) tablet 40 mg  40 mg Oral Daily Dawley, Troy C, DO   40 mg at 03/30/21 0901  . busPIRone (BUSPAR) tablet 30 mg  30 mg Oral BID Dawley, Troy C, DO   30 mg at 03/30/21 0901  . DULoxetine (CYMBALTA) DR capsule 60 mg  60 mg Oral q AM Dawley, Troy C, DO   60 mg at 03/30/21 0911  . feeding supplement (ENSURE ENLIVE / ENSURE PLUS) liquid 237 mL  237 mL Oral BID BM Dawley, Troy C, DO   237 mL at 03/30/21 1512  . ferrous sulfate tablet 325 mg  325 mg Oral BID WC Dawley, Troy C, DO   325 mg at 03/30/21 1654  . fluticasone (FLONASE) 50 MCG/ACT nasal spray 1-2 spray  1-2 spray Each Nare Daily PRN Dawley, Troy C, DO      . gabapentin (NEURONTIN) tablet 1,200 mg  1,200 mg Oral BID Dawley, Troy C, DO   1,200 mg at 03/30/21 0901  . heparin injection 5,000 Units  5,000 Units Subcutaneous Q8H Dawley, Troy C, DO   5,000 Units at 03/30/21 1324  . insulin glargine (LANTUS) injection 72 Units  72 Units Subcutaneous QHS Dawley, Troy C, DO   72 Units at 03/29/21 2219  . levothyroxine (SYNTHROID) tablet 75 mcg  75 mcg Oral QAC breakfast Dawley, Troy C, DO   75 mcg at 03/30/21 0451  . lisinopril (ZESTRIL) tablet 30 mg  30 mg Oral Daily Dawley, Troy C, DO   30 mg at 03/30/21 0901  . methocarbamol (ROBAXIN) tablet 750 mg  750 mg Oral Q6H Dawley, Troy C, DO   750 mg at 03/30/21 1654  . metoprolol succinate (TOPROL-XL) 24 hr tablet 50 mg  50 mg Oral Daily Dawley, Troy C, DO   50 mg at 03/30/21 0901  . montelukast (SINGULAIR) tablet 10 mg  10 mg Oral Daily PRN Dawley, Troy C, DO      . morphine 2 MG/ML injection 2 mg  2 mg Intravenous Q2H PRN Dawley, Troy C, DO   2 mg at 03/30/21 0902  . nitrofurantoin (macrocrystal-monohydrate) (MACROBID) capsule 100 mg  100 mg Oral Q12H Dawley, Troy C, DO   100 mg at 03/30/21 0903  . ondansetron (ZOFRAN) tablet 4 mg  4 mg Oral Q6H PRN Dawley, Troy C, DO       Or  .  ondansetron (ZOFRAN) injection 4 mg  4 mg Intravenous Q6H PRN Dawley, Troy C, DO      . oxyCODONE-acetaminophen (PERCOCET/ROXICET) 5-325 MG per tablet 1 tablet  1 tablet Oral Q6H PRN Dawley, Troy C, DO   1 tablet at 03/30/21 1433   And  . oxyCODONE (Oxy IR/ROXICODONE) immediate release tablet 5 mg  5 mg Oral Q6H PRN Dawley, Troy C, DO   5 mg at 03/30/21 1433  . pantoprazole (PROTONIX) EC tablet 40 mg  40 mg Oral Daily PRN Dawley, Troy C, DO      . pneumococcal 23 valent vaccine (PNEUMOVAX-23) injection 0.5 mL  0.5 mL Intramuscular Tomorrow-1000 Dawley, Troy C, DO      . QUEtiapine (SEROQUEL) tablet 200  mg  200 mg Oral QHS Dawley, Troy C, DO   200 mg at 03/29/21 2220  . senna-docusate (Senokot-S) tablet 1 tablet  1 tablet Oral QHS PRN Dawley, Troy C, DO      . sodium chloride flush (NS) 0.9 % injection 3 mL  3 mL Intravenous Q12H Dawley, Troy C, DO   3 mL at 03/30/21 0910  . sodium chloride flush (NS) 0.9 % injection 3 mL  3 mL Intravenous PRN Dawley, Troy C, DO      . topiramate (TOPAMAX) tablet 200 mg  200 mg Oral QHS Dawley, Troy C, DO   200 mg at 03/29/21 2218  . trimethoprim (TRIMPEX) tablet 100 mg  100 mg Oral Daily Dawley, Troy C, DO   100 mg at 03/30/21 0902  . Vilazodone HCl TABS 40 mg  40 mg Oral Daily Dawley, Troy C, DO   40 mg at 03/30/21 8338  . zolpidem (AMBIEN) tablet 5 mg  5 mg Oral QHS Dawley, Troy C, DO   5 mg at 03/29/21 2219     Discharge Medications: Please see discharge summary for a list of discharge medications.  Relevant Imaging Results:  Relevant Lab Results:   Additional Information [phone] patient states she has received covid vaccine and 1 booster shot  Vinie Sill, LCSW

## 2021-03-30 NOTE — TOC CAGE-AID Note (Signed)
Transition of Care [facility]) - CAGE-AID Screening   Patient Details  Name: [name] MRN: [id number] Date of Birth: 04-02-1946  [facility]) CM/SW Contact:    Clovis Cao, RN Phone Number: [phone] 03/30/2021, 11:12 AM   Clinical Narrative: Pt denies excessive alcohol or drug use.   CAGE-AID Screening:    Have You Ever Felt You Ought to Cut Down on Your Drinking or Drug Use?: No Have People Annoyed You By Critizing Your Drinking Or Drug Use?: No Have You Felt Bad Or Guilty About Your Drinking Or Drug Use?: No Have You Ever Had a Drink or Used Drugs First Thing In The Morning to Steady Your Nerves or to Get Rid of a Hangover?: No CAGE-AID Score: 0  Substance Abuse Education Offered: No

## 2021-03-30 NOTE — TOC Initial Note (Signed)
Transition of Care [facility]) - Initial/Assessment Note    Patient Details  Name: [name] MRN: [id number] Date of Birth: May 29, 1946  Transition of Care [facility]) CM/SW Contact:    Vinie Sill, LCSW Phone Number: 03/30/2021, 3:55 PM  Clinical Narrative:                  CSW met with patient at bedside. CSW introduced self and explained role. CSW discussed possible short term rehab at [facility]. At first patient was reluctant but states she would consider short term rehab at [facility]. Patient have permission to send SNF referral. CSW explained the SNF process. Patient states if she was to go she wants a private room.Patient states she is in the home with her roommate [name]) and her daughter just moved in today. She believed her home was too small to work w/HH services in the home.  Patient states her daughter and [name]( usually does not get along) but she states they have all lived together before, "so it will all work out". She believes she can get more therapy at SNF and begins to lean more towards going to rehab. CSW inquired about her relationship w/her daughter- patient states not the best, she smokes crack. However, she is not afraid of her daughter, she does not believe she will harm her, she has her own income/disbaility and uses her own money,she has no safety concerns. She shared her daughter will be there to help her in the home and has done so in the past. Patient states she has two sons that she talks with sometimes(they are not that involved). CSW expressed some concerns regarding home but patient declined CSW contacting her daughter. She refused mental health/councseling resources at this time. CSW informed to can contact police department,APS or the [facility] anytime she believes she needs help, but declined receiving any information at this time. CSW explained if anything changes, she can have her RN to request to see CSW. Patient states understanding. Patient is alert and oriented- CSW  will take no further action at this time.   CSW will provide bed offers once available CSW will continue to follow and assist with discharge planning.  Thurmond Butts, MSW, LCSW Clinical Social Worker      Expected Discharge Plan: Skilled Nursing Facility Barriers to Discharge: Continued Medical Work up   Patient Goals and CMS Choice        Expected Discharge Plan and Services Expected Discharge Plan: [place] In-house Referral: Clinical Social Work                                            Prior Living Arrangements/Services   Lives with:: Self,Adult Children,Roommate          Need for Family Participation in Patient Care: Yes (Comment) Care giver support system in place?: Yes (comment)   Criminal Activity/Legal Involvement Pertinent to Current Situation/Hospitalization: No - Comment as needed  Activities of Daily Living Home Assistive Devices/Equipment: Gilford Rile (specify type) ADL Screening (condition at time of admission) Patient's cognitive ability adequate to safely complete daily activities?: Yes Is the patient deaf or have difficulty hearing?: Yes Does the patient have difficulty seeing, even when wearing glasses/contacts?: Yes Does the patient have difficulty concentrating, remembering, or making decisions?: Yes Patient able to express need for assistance with ADLs?: No Does the patient have difficulty dressing or bathing?:  No Independently performs ADLs?: Yes (appropriate for developmental age) Does the patient have difficulty walking or climbing stairs?: Yes Weakness of Legs: Both Weakness of Arms/Hands: None  Permission Sought/Granted   Permission granted to share information with : No              Emotional Assessment Appearance:: Appears stated age Attitude/Demeanor/Rapport: Engaged Affect (typically observed): Pleasant,Appropriate Orientation: : Oriented to Self,Oriented to Place,Oriented to  Time,Oriented to  Situation Alcohol / Substance Use: Not Applicable Psych Involvement: No (comment)  Admission diagnosis:  Fracture [T14.8XXA] Patient Active Problem List   Diagnosis Date Noted  . Fracture 03/29/2021  . Status post lumbar spinal fusion 02/22/2021  . Anemia, blood loss   . Hypothyroidism   . AMS (altered mental status) 02/15/2021  . AMS (altered mental status) 02/15/2021  . Lumbar foraminal stenosis 02/09/2021  . Urinary tract infection 09/12/2020  . Acidosis 08/22/2020  . Acute kidney injury superimposed on CKD ([place]) 03/06/2020  . Hyperkalemia 03/06/2020  . Acute metabolic encephalopathy 04/88/8916  . Acute respiratory failure with hypoxia ([place]) 02/06/2020  . OSA (obstructive sleep apnea) 02/06/2020  . CPAP ventilation treatment not tolerated 02/06/2020  . Morbid obesity with BMI of 45.0-49.9, adult ([place]) 02/06/2020  . Lactic acidosis 02/06/2020  . possible Sepsis ([place]) 02/06/2020  . Polypharmacy 02/06/2020  . CAD (coronary artery disease) 05/05/2018  . Preop cardiovascular exam 04/08/2018  . Microscopic hematuria 09/17/2016  . Cystitis, chronic 09/17/2016  . 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 Closed fracture of metatarsal bone 07/22/2016  . Plantar fasciitis, left 05/28/2016  . Degenerative spondylolisthesis 01/02/2016  . Tachycardia 01/02/2016  . Type 2 diabetes mellitus with hyperlipidemia ([place])   . Essential hypertension   . Cardiomyopathy ([place]) 10/06/2015  . Heart palpitations 10/06/2015  . SOB (shortness of breath) on exertion 10/06/2015  . Hematuria, unspecified 01/25/2015  . Incontinence of urine in female 01/25/2015  . Non-toxic multinodular goiter 10/11/2014  . Diabetes mellitus ([place]) 10/11/2014  . Hyperlipidemia 10/11/2014  . Depression 10/11/2014  . Osteoporosis 10/11/2014  . GERD (gastroesophageal reflux disease) 10/11/2014  . Arthritis 10/11/2014  . Cystocele 10/11/2014  . Chronic pain syndrome 10/11/2014  . Amnesia 10/11/2014  .  Back pain 10/11/2014  . Anxiety state 04/10/2013  . Major depressive disorder, recurrent episode ([place]) 04/10/2013  . Convulsions ([place]) 05/29/2012   PCP:  [facility]:   [id number] [place], [place] [id number]: [phone]: [phone]    Social Determinants of Health (SDOH) Interventions    Readmission Risk Interventions Readmission Risk Prevention Plan 03/07/2020  Transportation Screening Complete  PCP or Specialist Appt within 3-5 Days Complete  [place] or [place]  Social Work Consult for [place]sel[place] Complete  Palliative Care Screening Not Applicable  Medication Review (Press photographer Complete  Some recent data might be hidden

## 2021-03-30 NOTE — Evaluation (Addendum)
Occupational Therapy Evaluation Patient Details Name: [name] MRN: [id number] DOB: 28-Sep-1946 Today's Date: 03/30/2021    History of Present Illness 75 y/o s/p fall from bed with intractable back pain and second fall noted in chart from falling in mud puddle striking head against car on 5/23. T12 acute compression fx with mild loss height, multiple TP fx R T2-8 L5 with orders for TLSO PMH s/p PLIF L5-S1 on 4/8. anxiety, CHF, CKD, chronic pain syndrome, depression, DM type II, GERD, HTN, HLD, IBS, seizure, sleep apnea   Clinical Impression   PT admitted with compression fx s/p multiple falls. Pt currently with functional limitiations due to the deficits listed below (see OT problem list). Pt was driving and indep transferring after first surgery. Pt with multiple falls and reports extreme pain after fall in mud puddle after exiting car. Pt reports needing assistance for all care for x4 days prior to admission. Brace poor fit and pending new brace to progress patient. [facility] contacted. Pt verbalized inability to have home care due to female that rents a room in the home not wanting any visitors. Pt states "he lives there and pays rent so my daughter just has to help me when she can. She went to school to do this."  Pt will benefit from skilled OT to increase their independence and safety with adls and balance to allow discharge SNF.     Follow Up Recommendations  SNF;Supervision/Assistance - 24 hour (due to inability to get therapy in home)    Equipment Recommendations  None recommended by OT    Recommendations for Other Services       Precautions / Restrictions Precautions Precautions: Fall Precaution Comments: back precautions for comfort Required Braces or Orthoses: Spinal Brace Spinal Brace: Thoracolumbosacral orthotic;Other (comment) Spinal Brace Comments: clamshell when OOB Restrictions Weight Bearing Restrictions: No      Mobility Bed Mobility Overal bed mobility: Needs  Assistance Bed Mobility: Rolling;Sidelying to Sit;Sit to Sidelying Rolling: Mod assist Sidelying to sit: Mod assist;+2 for physical assistance;HOB elevated     Sit to sidelying: Min assist;+2 for physical assistance;HOB elevated General bed mobility comments: modA with repeated verbal cues to maintain spine in neutral position with rolling to reduce pain, pt internally distracted by pain and needing increased assist at times. heavy use of bed rails and HOB elevated, still requiring modA to complete transition as well as sequential cueing for technique    Transfers                 General transfer comment: deferred due to ill-fitting brace, unable to don despite attempts to adjust, will progress OOB mobility once appropriately sized brace is available.    Balance Overall balance assessment: Needs assistance Sitting-balance support: Bilateral upper extremity supported Sitting balance-Leahy Scale: Poor Sitting balance - Comments: pt able to maintain for short bits without UE support, limited due to pain                                   ADL either performed or assessed with clinical judgement   ADL Overall ADL's : Needs assistance/impaired Eating/Feeding: Minimal assistance;Bed level   Grooming: Minimal assistance;Bed level   Upper Body Bathing: Maximal assistance   Lower Body Bathing: Total assistance   Upper Body Dressing : Maximal assistance   Lower Body Dressing: Total assistance  General ADL Comments: bed level eval only due to pain and brace that does not fit. RN notified. [facility] called and notified     Vision Baseline Vision/History: Wears glasses       Perception     Praxis      Pertinent Vitals/Pain Pain Assessment: Faces Faces Pain Scale: Hurts whole lot Pain Location: low back and hip with upright position and changes in position Pain Descriptors / Indicators: Spasm Pain Intervention(s): Limited activity within  patient's tolerance;Monitored during session;Premedicated before session     Hand Dominance Right   Extremity/Trunk Assessment Upper Extremity Assessment Upper Extremity Assessment: Generalized weakness   Lower Extremity Assessment Lower Extremity Assessment: Defer to PT evaluation   Cervical / Trunk Assessment Cervical / Trunk Assessment: Kyphotic   Communication Communication Communication: No difficulties   Cognition Arousal/Alertness: Awake/alert Behavior During Therapy: WFL for tasks assessed/performed Overall Cognitive Status: No family/caregiver present to determine baseline cognitive functioning Area of Impairment: Memory;Problem solving                     Memory: Decreased recall of precautions       Problem Solving: Slow processing;Decreased initiation;Requires verbal cues General Comments: repeated verbal cues for technique, safety, and mobility to maintain precautions and minimize pain. Pt seemingly internally distracted by pain at times, but able to recall information about home set up and events leading up to hospitalization accurately   General Comments  attempting to don brace in static sitting without abiliyt to close brace and noted to have sharp edges with modifications. Brace does not fit the patients to even have straps meet. [facility] regarding need for increased size brace.SpO2 dropped to 87% on RA with bed mobility, returned to 1L O2 with SpO2 at 95% at rest    Exercises     Shoulder Instructions      Home Living Family/patient expects to be discharged to:: Private residence Living Arrangements: Children Available Help at Discharge: Available 24 hours/day Type of Home: House Home Access: Level entry     Home Layout: One level     Bathroom Shower/Tub: Occupational psychologist: Standard Bathroom Accessibility: Yes   Home Equipment: Environmental consultant - 2 wheels;Walker - 4 wheels   Additional Comments: Pt reports taking sink bath  with daughter's assistance      Prior Functioning/Environment Level of Independence: Needs assistance  Gait / Transfers Assistance Needed: with chart review reports 2 falls prior to april 2022 and now reports two falls between admissions so 4 total in last 6 months noted. using SPC and rollator, reports bed bound since fall due to back pain (4 days) ADL's / Homemaking Assistance Needed: needs assistance from daughter for sink baths            OT Problem List: Decreased strength;Decreased activity tolerance;Impaired balance (sitting and/or standing);Decreased safety awareness;Decreased knowledge of use of DME or AE;Decreased knowledge of precautions;Cardiopulmonary status limiting activity;Obesity;Pain      OT Treatment/Interventions: Self-care/ADL training;Therapeutic exercise;DME and/or AE instruction;Therapeutic activities;Patient/family education;Balance training;Energy conservation    OT Goals(Current goals can be found in the care plan section) Acute Rehab OT Goals Patient Stated Goal: reduce pain OT Goal Formulation: With patient Time For Goal Achievement: 04/13/21 Potential to Achieve Goals: Good  OT Frequency: Min 2X/week   Barriers to D/C: Decreased caregiver support  reports living with daughter but unable to have home therapy due to female that lives at home does not like visitors. pt states "he pays rent there and  says we have to work it out and doesnt want therapy in the home"       Co-evaluation PT/OT/SLP Co-Evaluation/Treatment: Yes Reason for Co-Treatment: Complexity of the patient's impairments (multi-system involvement);Necessary to address cognition/behavior during functional activity;For patient/therapist safety;To address functional/ADL transfers PT goals addressed during session: Mobility/safety with mobility;Balance;Strengthening/ROM OT goals addressed during session: ADL's and self-care;Proper use of Adaptive equipment and DME;Strengthening/ROM      AM-PAC  OT "6 Clicks" Daily Activity     Outcome Measure Help from another person eating meals?: A Little Help from another person taking care of personal grooming?: A Little Help from another person toileting, which includes using toliet, bedpan, or urinal?: A Lot Help from another person bathing (including washing, rinsing, drying)?: A Lot Help from another person to put on and taking off regular upper body clothing?: A Lot Help from another person to put on and taking off regular lower body clothing?: A Lot 6 Click Score: 14   End of Session Equipment Utilized During Treatment: Oxygen Nurse Communication: Mobility status;Precautions  Activity Tolerance: Patient tolerated treatment well Patient left: in bed;with call bell/phone within reach;with bed alarm set  OT Visit Diagnosis: Unsteadiness on feet (R26.81);Muscle weakness (generalized) (M62.81);Pain                Time: 1209-1230 OT Time Calculation (min): 21 min Charges:  OT General Charges $OT Visit: 1 Visit OT Evaluation $OT Eval Moderate Complexity: 1 Mod     Brynn, OTR/L  Acute Rehabilitation Services Pager: [phone] Office: [phone] .   Jeri Modena 03/30/2021, 2:57 PM

## 2021-03-30 NOTE — Progress Notes (Signed)
Initial Nutrition Assessment  DOCUMENTATION CODES:   Morbid obesity  INTERVENTION:  Provide Ensure Enlive po BID, each supplement provides 350 kcal and 20 grams of protein  Encourage adequate PO intake.   NUTRITION DIAGNOSIS:   Increased nutrient needs related to chronic illness (CHF) as evidenced by estimated needs.  GOAL:   Patient will meet greater than or equal to 90% of their needs  MONITOR:   PO intake,Supplement acceptance,Skin,Weight trends,Labs,I & O's  REASON FOR ASSESSMENT:   Malnutrition Screening Tool    ASSESSMENT:   75 year old female with a history of CHF, L4-S1 fusion, most notably in extension fusion, L5-S1 April 2022. Presents after fall. Pt found to have multiple thoracic compression fractures.   Pt unavailable during attempted time of visit. RD unable to obtain pt nutrition history at this time. Meal completion has been 50%. RD to order nutritional supplements to aid in caloric and protein needs. Unable to complete Nutrition-Focused physical exam at this time. Per MD, no surgical intervention at this time.   Labs and medications reviewed.   Diet Order:   Diet Order            Diet regular Room service appropriate? Yes; Fluid consistency: Thin  Diet effective now                 EDUCATION NEEDS:   Not appropriate for education at this time  Skin:  Skin Assessment: Reviewed RN Assessment  Last BM:  5/25  Height:   Ht Readings from Last 1 Encounters:  03/29/21 5\' 3"  (1.6 m)    Weight:   Wt Readings from Last 1 Encounters:  03/29/21 113.4 kg   BMI:  Body Mass Index 44.29 kg/m^2  Estimated Nutritional Needs:   Kcal:  1850-2050  Protein:  95-110 grams  Fluid:  >/= 1.8 L/day  Corrin Parker, MS, RD, LDN RD pager number/after hours weekend pager number on Amion.

## 2021-03-30 NOTE — Progress Notes (Signed)
OT NOTE  [facility] delivered new TLSO brace that fits patient. Pt supine in brace so attempting sit<>stand in brace. Pt is unable to tolerate standing for 3 minutes required for upright films. Educated patient to doff brace supine and never sleep in brace.   03/30/21 1500  OT Visit Information  Last OT Received On 03/30/21  Assistance Needed +2  History of Present Illness 75 y/o s/p fall from bed with intractable back pain and second fall noted in chart from falling in mud puddle striking head against car on 5/23. T12 acute compression fx with mild loss height, multiple TP fx R T2-8 L5 with orders for TLSO PMH s/p PLIF L5-S1 on 4/8. anxiety, CHF, CKD, chronic pain syndrome, depression, DM type II, GERD, HTN, HLD, IBS, seizure, sleep apnea  Precautions  Precautions Fall  Precaution Comments back precautions for comfort  Required Braces or Orthoses Spinal Brace  Spinal Brace TLSO;Other (comment)  Spinal Brace Comments clamshell when OOB  Pain Assessment  Pain Assessment Faces  Pain Score 10  Faces Pain Scale 10  Pain Location back and hip  Pain Descriptors / Indicators Spasm  Pain Intervention(s) Limited activity within patient's tolerance;Monitored during session;Repositioned;Patient requesting pain meds-RN notified  Cognition  Arousal/Alertness Awake/alert  Behavior During Therapy WFL for tasks assessed/performed  Overall Cognitive Status No family/caregiver present to determine baseline cognitive functioning  ADL  Overall ADL's  Needs assistance/impaired  General ADL Comments in bed supine with TLSO on in the bed. pt reports "i can sleep in this brace" pt educated that she should not sleep in the brace and it should be doff supine. Pt total (A) to doff brace at EOB.  Bed Mobility  Overal bed mobility Needs Assistance  Bed Mobility Rolling;Supine to Sit;Sit to Supine  Rolling Mod assist  Supine to sit +2 for physical assistance;Mod assist  Sit to supine Mod assist  General bed  mobility comments bed elevated to help with transitions from supine to sitting and sitting <>supine. pt sitting with increase pain mod (A)  Balance  Overall balance assessment Needs assistance  Sitting-balance support Bilateral upper extremity supported;Feet supported  Sitting balance-Leahy Scale Poor  Standing balance support Bilateral upper extremity supported;During functional activity  Standing balance-Leahy Scale Poor  Standing balance comment reliants on therapist/ RN and RW  Transfers  Overall transfer level Needs assistance  Equipment used Rolling walker (2 wheeled)  Transfers Sit to/from Stand  Sit to Stand Mod assist;+2 physical assistance;From elevated surface  General transfer comment elevated surface to help achieve static standing and unable to sustain lower than 1 minute. pt able to side step toward HOB. pt asking for when MD will operate. Reports pain is aweful that they need to do something  General Comments  General comments (skin integrity, edema, etc.) HR 125 RA 88%  OT - End of Session  Equipment Utilized During Treatment Oxygen;Gait belt;Back brace  Activity Tolerance Patient tolerated treatment well  Patient left in bed;with call bell/phone within reach;with bed alarm set  Nurse Communication Mobility status;Precautions  OT Assessment/Plan  OT Plan Discharge plan remains appropriate  OT Visit Diagnosis Unsteadiness on feet (R26.81);Muscle weakness (generalized) (M62.81);Pain  OT Frequency (ACUTE ONLY) Min 2X/week  Follow Up Recommendations SNF;Supervision/Assistance - 24 hour  OT Equipment None recommended by OT  AM-PAC OT "6 Clicks" Daily Activity Outcome Measure (Version 2)  Help from another person eating meals? 3  Help from another person taking care of personal grooming? 3  Help from another person toileting, which includes  using toliet, bedpan, or urinal? 2  Help from another person bathing (including washing, rinsing, drying)? 2  Help from another person  to put on and taking off regular upper body clothing? 2  Help from another person to put on and taking off regular lower body clothing? 2  6 Click Score 14  OT Goal Progression  Progress towards OT goals Progressing toward goals  Acute Rehab OT Goals  Patient Stated Goal reduce pain  OT Goal Formulation With patient  Time For Goal Achievement 04/13/21  Potential to Achieve Goals Good  ADL Goals  Pt Will Transfer to Toilet with mod assist;ambulating;bedside commode  Additional ADL Goal #1 pt will complete bed mobility supervision as precursor to adls with hOb less 30 degrees  Additional ADL Goal #2 pt will don doff brace min (A) as precursor to adls.  Additional ADL Goal #3 pt will complete basic transfer with RW min guard (A) as precursor to adls.  OT Time Calculation  OT Start Time (ACUTE ONLY) 1410  OT Stop Time (ACUTE ONLY) 1430  OT Time Calculation (min) 20 min  OT General Charges  $OT Visit 1 Visit  OT Treatments  $Self Care/Home Management  8-22 mins   Brynn, OTR/L  Acute Rehabilitation Services Pager: [phone] Office: [phone] .

## 2021-03-31 LAB — COMPREHENSIVE METABOLIC PANEL WITH GFR
ALT: 25 U/L (ref 0–44)
AST: 19 U/L (ref 15–41)
Albumin: 2.7 g/dL — ABNORMAL LOW (ref 3.5–5.0)
Alkaline Phosphatase: 87 U/L (ref 38–126)
Anion gap: 8 (ref 5–15)
BUN: 26 mg/dL — ABNORMAL HIGH (ref 8–23)
CO2: 19 mmol/L — ABNORMAL LOW (ref 22–32)
Calcium: 8.9 mg/dL (ref 8.9–10.3)
Chloride: 108 mmol/L (ref 98–111)
Creatinine, Ser: 1.4 mg/dL — ABNORMAL HIGH (ref 0.44–1.00)
GFR, Estimated: 40 mL/min — ABNORMAL LOW
Glucose, Bld: 251 mg/dL — ABNORMAL HIGH (ref 70–99)
Potassium: 5.1 mmol/L (ref 3.5–5.1)
Sodium: 135 mmol/L (ref 135–145)
Total Bilirubin: 0.8 mg/dL (ref 0.3–1.2)
Total Protein: 5.7 g/dL — ABNORMAL LOW (ref 6.5–8.1)

## 2021-03-31 LAB — CBC
HCT: 28 % — ABNORMAL LOW (ref 36.0–46.0)
Hemoglobin: 8.7 g/dL — ABNORMAL LOW (ref 12.0–15.0)
MCH: 29.8 pg (ref 26.0–34.0)
MCHC: 31.1 g/dL (ref 30.0–36.0)
MCV: 95.9 fL (ref 80.0–100.0)
Platelets: 124 K/uL — ABNORMAL LOW (ref 150–400)
RBC: 2.92 MIL/uL — ABNORMAL LOW (ref 3.87–5.11)
RDW: 15.7 % — ABNORMAL HIGH (ref 11.5–15.5)
WBC: 9 K/uL (ref 4.0–10.5)
nRBC: 0 % (ref 0.0–0.2)

## 2021-03-31 LAB — GLUCOSE, CAPILLARY: Glucose-Capillary: 195 mg/dL — ABNORMAL HIGH (ref 70–99)

## 2021-03-31 NOTE — Progress Notes (Signed)
Subjective: Patient reports still quite uncomfortable  Objective: Vital signs in last 24 hours: Temp:  [97.6 F (36.4 C)-98.5 F (36.9 C)] 97.6 F (36.4 C) (05/28 0730) Pulse Rate:  [84-107] 85 (05/28 0730) Resp:  [14-20] 14 (05/28 0730) BP: (105-136)/(46-72) 117/51 (05/28 0730) SpO2:  [92 %-95 %] 92 % (05/28 0730)  Intake/Output from previous day: 05/27 0701 - 05/28 0700 In: 243 [P.O.:240; I.V.:3] Out: 2600 [Urine:2600] Intake/Output this shift: Total I/O In: 120 [P.O.:120] Out: -   Physical Exam: Still in pain.  MAEW.    Lab Results: Recent Labs    03/29/21 0240  WBC 11.5*  HGB 9.7*  HCT 31.8*  PLT 144*   BMET Recent Labs    03/29/21 0240 03/29/21 1848  NA 139 140  K 4.3 4.2  CL 111 113*  CO2 20* 21*  GLUCOSE 226* 170*  BUN 24* 24*  CREATININE 1.28* 1.20*  CALCIUM 8.8* 8.5*    Studies/Results: No results found.  Assessment/Plan: Has brace.  Needs to mobilize with PT before discharge home.  Still quite uncomfortable.    LOS: 2 days    Peggyann Shoals, MD 03/31/2021, 9:27 AM

## 2021-03-31 NOTE — Progress Notes (Signed)
Notified On call provider of pts mental status. Pt more lethargic today than yesterday. NP arrived to bedside, see chart for new orders.

## 2021-03-31 NOTE — Progress Notes (Signed)
Physical Therapy Treatment Patient Details Name: [name] MRN: [id number] DOB: Oct 12, 1946 Today's Date: 03/31/2021    History of Present Illness 75 y/o s/p fall from bed with intractable back pain and second fall noted in chart from falling in mud puddle striking head against car on 5/23. T12 acute compression fx with mild loss height, multiple TP fx R T2-8 L5 with orders for TLSO PMH s/p PLIF L5-S1 on 4/8. anxiety, CHF, CKD, chronic pain syndrome, depression, DM type II, GERD, HTN, HLD, IBS, seizure, sleep apnea    PT Comments    The pt was agreeable to session with progression of OOB mobility today despite significant drowsiness. The pt required repeated verbal stimulation to maintain alertness, and max cues to complete movements, but was able to follow simple commands consistently with max cues. The pt demos improved abilities to complete rolling and bed mobility, but needed modA of 2 to stand from EOB and take small steps to recliner this session. Her standing and ambulation tolerance were limited by decreased alertness and difficulty maintaining attention to tasks at this time. She will continue to benefit from skilled PT to progress functional power, endurance, and stability and continues to need short stint SNF rehab to maximize functional independence prior to return home.   Follow Up Recommendations  SNF;Supervision/Assistance - 24 hour     Equipment Recommendations  Wheelchair (measurements PT);Wheelchair cushion (measurements PT)    Recommendations for Other Services       Precautions / Restrictions Precautions Precautions: Fall Precaution Comments: back precautions for comfort Required Braces or Orthoses: Spinal Brace Spinal Brace: Thoracolumbosacral orthotic;Other (comment) Spinal Brace Comments: clamshell when OOB Restrictions Weight Bearing Restrictions: No    Mobility  Bed Mobility Overal bed mobility: Needs Assistance Bed Mobility: Rolling;Sidelying to  Sit Rolling: Min assist Sidelying to sit: Mod assist;+2 for physical assistance       General bed mobility comments: pt completing multiple rolls to don clamshell. needing cues for sequencing and all movements, but able to complete with increased time. minA to initiate reach and complete roll. modA to elevate trunk from [facility] and steady with initial sitting    Transfers Overall transfer level: Needs assistance Equipment used: Rolling walker (2 wheeled) Transfers: Sit to/from Stand Sit to Stand: Mod assist;+2 physical assistance;From elevated surface         General transfer comment: increased time to power up, modA while pt moving UE from bed to RW. able to maintain static standing for 30 seconds, then complete lateral stepping to recliner.  Ambulation/Gait Ambulation/Gait assistance: Mod assist;+2 physical assistance Gait Distance (Feet): 4 Feet Assistive device: Rolling walker (2 wheeled) Gait Pattern/deviations: Step-to pattern;Shuffle;Trunk flexed Gait velocity: decreased Gait velocity interpretation: <1.31 ft/sec, indicative of household ambulator General Gait Details: small lateral steps with heavy reliance on BUE support, modA to manage RW and steady.       Balance Overall balance assessment: Needs assistance Sitting-balance support: Bilateral upper extremity supported;Feet supported Sitting balance-Leahy Scale: Poor Sitting balance - Comments: pt able to maintain for short bits without UE support, limited due to pain   Standing balance support: Bilateral upper extremity supported;During functional activity Standing balance-Leahy Scale: Poor Standing balance comment: reliants on therapist/ RN and RW                            Cognition Arousal/Alertness: Lethargic Behavior During Therapy: Flat affect;WFL for tasks assessed/performed Overall Cognitive Status: No family/caregiver present to determine baseline cognitive  functioning Area of Impairment:  Memory;Problem solving                     Memory: Decreased recall of precautions       Problem Solving: Slow processing;Decreased initiation;Requires verbal cues General Comments: repeated verbal stimuli to maintain alertness this session. RN states no changes in pain medicine but pt has been significantly more lethargic all day. Pt able to follow repeated, direct commands, increased time to complete movements      Exercises General Exercises - Lower Extremity Ankle Circles/Pumps: AROM;Both;10 reps Heel Slides: AAROM;Both;10 reps;Supine Hip ABduction/ADduction: AAROM;Both;10 reps;Supine    General Comments General comments (skin integrity, edema, etc.): HR in low 100s, SpO2 88% on RA at rest, maintained >905 on 1L      Pertinent Vitals/Pain Pain Assessment: Faces Faces Pain Scale: Hurts little more Pain Location: back and hip Pain Descriptors / Indicators: Spasm Pain Intervention(s): Limited activity within patient's tolerance;Monitored during session;Repositioned           PT Goals (current goals can now be found in the care plan section) Acute Rehab PT Goals Patient Stated Goal: reduce pain PT Goal Formulation: With patient Time For Goal Achievement: 04/13/21 Potential to Achieve Goals: Fair Progress towards PT goals: Progressing toward goals    Frequency    Min 3X/week      PT Plan Current plan remains appropriate       AM-PAC PT "6 Clicks" Mobility   Outcome Measure  Help needed turning from your back to your side while in a flat bed without using bedrails?: A Little Help needed moving from lying on your back to sitting on the side of a flat bed without using bedrails?: A Little Help needed moving to and from a bed to a chair (including a wheelchair)?: A Lot Help needed standing up from a chair using your arms (e.g., wheelchair or bedside chair)?: A Lot Help needed to walk in hospital room?: A Lot Help needed climbing 3-5 steps with a railing?  : A Lot 6 Click Score: 14    End of Session Equipment Utilized During Treatment: Gait belt;Oxygen Activity Tolerance: Patient limited by fatigue;Patient limited by lethargy Patient left: in chair;with call bell/phone within reach;with chair alarm set Nurse Communication: Mobility status PT Visit Diagnosis: Other abnormalities of gait and mobility (R26.89);Pain     Time: 8185-6314 PT Time Calculation (min) (ACUTE ONLY): 40 min  Charges:  $Gait Training: 8-22 mins $Therapeutic Exercise: 8-22 mins $Therapeutic Activity: 8-22 mins                     [name], PT, DPT   Acute Rehabilitation Department Pager #: [phone]   Otho Bellows 03/31/2021, 12:54 PM

## 2021-03-31 NOTE — Progress Notes (Addendum)
I was told in report not to give any pain medicine or sedatives tonight per NP on call today. This is because of her mental status and lethargy.

## 2021-04-01 LAB — BASIC METABOLIC PANEL
Anion gap: 6 (ref 5–15)
BUN: 23 mg/dL (ref 8–23)
CO2: 22 mmol/L (ref 22–32)
Calcium: 9.2 mg/dL (ref 8.9–10.3)
Chloride: 111 mmol/L (ref 98–111)
Creatinine, Ser: 1.06 mg/dL — ABNORMAL HIGH (ref 0.44–1.00)
GFR, Estimated: 55 mL/min — ABNORMAL LOW (ref >60)
Glucose, Bld: 170 mg/dL — ABNORMAL HIGH (ref 70–99)
Potassium: 4.6 mmol/L (ref 3.5–5.1)
Sodium: 139 mmol/L (ref 135–145)

## 2021-04-01 LAB — GLUCOSE, CAPILLARY: Glucose-Capillary: 312 mg/dL — ABNORMAL HIGH (ref 70–99)

## 2021-04-01 MED ORDER — INSULIN ASPART 100 UNIT/ML IJ SOLN
0.0000 [IU] | Freq: Three times a day (TID) | INTRAMUSCULAR | Status: DC
  Administered 2021-04-02: 11 [IU] via SUBCUTANEOUS
  Administered 2021-04-02: 7 [IU] via SUBCUTANEOUS
  Administered 2021-04-02 – 2021-04-03 (×3): 4 [IU] via SUBCUTANEOUS
  Administered 2021-04-03: 7 [IU] via SUBCUTANEOUS
  Administered 2021-04-04 (×2): 4 [IU] via SUBCUTANEOUS

## 2021-04-01 NOTE — Progress Notes (Signed)
Neurosurgery Service Progress Note  Subjective: No acute events overnight   Objective: Vitals:   04/01/21 0108 04/01/21 0541 04/01/21 0742 04/01/21 1100  BP: (!) 146/86 (!) 178/60 (!) 166/92 132/75  Pulse: 85 97 (!) 102 98  Resp:   17 18  Temp: 99.4 F (37.4 C) 100 F (37.8 C) 99.5 F (37.5 C) 99.5 F (37.5 C)  TempSrc: Axillary Axillary Oral Oral  SpO2: 95% 97% 95% 94%    Physical Exam: AOx3, FS, TM, Strength 5/5 x4, SILTx4 except baseline b/l stocking neuropathy  Assessment & Plan: 75 y.o. woman w/ prior lumbar fusion s/p fall with multiple TP frx and comp frx.  -will decrease pain Rx doses given concern for sedation (currently awake/alert) -SNF discharge pending -[facility]  Marcello Moores A Alwyn Cordner  04/01/21 1:19 PM

## 2021-04-01 NOTE — Progress Notes (Signed)
Inpatient Diabetes Program Recommendations  AACE/ADA: New Consensus Statement on Inpatient Glycemic Control (2015)  Target Ranges:  Prepandial:   less than 140 mg/dL      Peak postprandial:   less than 180 mg/dL (1-2 hours)      Critically ill patients:  140 - 180 mg/dL   Lab Results  Component Value Date   GLUCAP 195 (H) 03/31/2021   HGBA1C 6.6 (H) 02/09/2021    Review of Glycemic Control  Diabetes history: DM2 Outpatient Diabetes medications: Lantus 72 units QHS Current orders for Inpatient glycemic control: Lantus 72 units QHS  HgbA1C - 6.6% Needs Novolog 0-9 units TID with meals or CBG checks TID with meals Secure text to RN.  Inpatient Diabetes Program Recommendations:     Consider addition of Novolog 0-9 units TID with meals.  Continue to follow.  Thank you. Lorenda Peck, RD, LDN, CDE Inpatient Diabetes Coordinator [phone]

## 2021-04-02 LAB — GLUCOSE, CAPILLARY
Glucose-Capillary: 181 mg/dL — ABNORMAL HIGH (ref 70–99)
Glucose-Capillary: 258 mg/dL — ABNORMAL HIGH (ref 70–99)
Glucose-Capillary: 233 mg/dL — ABNORMAL HIGH (ref 70–99)
Glucose-Capillary: 250 mg/dL — ABNORMAL HIGH (ref 70–99)

## 2021-04-02 NOTE — Progress Notes (Signed)
Occupational Therapy Treatment Patient Details Name: [name] MRN: [id number] DOB: 29-Dec-1945 Today's Date: 04/02/2021    History of present illness 75 y/o s/p fall from bed with intractable back pain and second fall noted in chart from falling in mud puddle striking head against car on 5/23. T12 acute compression fx with mild loss height, multiple TP fx R T2-8 L5 with orders for TLSO PMH s/p PLIF L5-S1 on 4/8. anxiety, CHF, CKD, chronic pain syndrome, depression, DM type II, GERD, HTN, HLD, IBS, seizure, sleep apnea   OT comments  Pt able to progress OOB and into RW this session min (A) and reports increased control of spasms at this time. Pt guarded initially with anxiety over increased pain with movement. Pt reports "ill do whatever you need to me to do" Recommendation SNf at this time. Pt reports [name] is helping her with all appointments and feeling bad to increase the burden on him.    Follow Up Recommendations  SNF;Supervision/Assistance - 24 hour    Equipment Recommendations  None recommended by OT    Recommendations for Other Services      Precautions / Restrictions Precautions Precautions: Fall;Back Precaution Comments: back precautions for comfort Required Braces or Orthoses: Spinal Brace Spinal Brace: Thoracolumbosacral orthotic;Other (comment) Spinal Brace Comments: clamshell when OOB Restrictions Weight Bearing Restrictions: No       Mobility Bed Mobility Overal bed mobility: Needs Assistance Bed Mobility: Rolling;Sidelying to Sit Rolling: Min assist Sidelying to sit: Min assist       General bed mobility comments: Use of bed pad to guide hips into right sidelying, minA at trunk to bring to upright positioning    Transfers Overall transfer level: Needs assistance Equipment used: Rolling walker (2 wheeled) Transfers: Sit to/from Stand Sit to Stand: Min assist         General transfer comment: MinA to rise from edge of bed, pt pushing up on walker  so stabilized    Balance Overall balance assessment: Needs assistance Sitting-balance support: Feet supported Sitting balance-Leahy Scale: Fair     Standing balance support: Bilateral upper extremity supported;During functional activity Standing balance-Leahy Scale: Poor Standing balance comment: reliants on RW                           ADL either performed or assessed with clinical judgement   ADL                   Upper Body Dressing : Total assistance Upper Body Dressing Details (indicate cue type and reason): total (A) for brace don doff at thist time. pt does not engage in process.                 Functional mobility during ADLs: Minimal assistance;Rolling walker       Vision       Perception     Praxis      Cognition Arousal/Alertness: Awake/alert Behavior During Therapy: WFL for tasks assessed/performed Overall Cognitive Status: No family/caregiver present to determine baseline cognitive functioning Area of Impairment: Memory                     Memory: Decreased short-term memory         General Comments: reports pending back surgery without any mention in the chart at this time. pt reports "i am not going to make myself hurt more for not reason" pt repeats that she cant reach any further with  demonstration supine.        Exercises     Shoulder Instructions       General Comments HR 130 with movement. Pt reports increased pain with movement 8/10 but 2-3 out 10 supine    Pertinent Vitals/ Pain       Pain Assessment: 0-10 Pain Score: 8  Pain Location: back Pain Descriptors / Indicators: Spasm;Guarding;Grimacing Pain Intervention(s): Limited activity within patient's tolerance;Monitored during session;Premedicated before session;Repositioned  Home Living                                          Prior Functioning/Environment              Frequency  Min 2X/week        Progress Toward  Goals  OT Goals(current goals can now be found in the care plan section)  Progress towards OT goals: Progressing toward goals  Acute Rehab OT Goals Patient Stated Goal: reduce pain OT Goal Formulation: With patient Time For Goal Achievement: 04/13/21 Potential to Achieve Goals: Good ADL Goals Pt Will Transfer to Toilet: with mod assist;ambulating;bedside commode Additional ADL Goal #1: pt will complete bed mobility supervision as precursor to adls with hOb less 30 degrees Additional ADL Goal #2: pt will don doff brace min (A) as precursor to adls. Additional ADL Goal #3: pt will complete basic transfer with RW min guard (A) as precursor to adls.  Plan Discharge plan remains appropriate    Co-evaluation                 AM-PAC OT "6 Clicks" Daily Activity     Outcome Measure   Help from another person eating meals?: A Little Help from another person taking care of personal grooming?: A Little Help from another person toileting, which includes using toliet, bedpan, or urinal?: A Lot Help from another person bathing (including washing, rinsing, drying)?: A Lot Help from another person to put on and taking off regular upper body clothing?: A Lot Help from another person to put on and taking off regular lower body clothing?: A Lot 6 Click Score: 14    End of Session Equipment Utilized During Treatment: Rolling walker;Back brace  OT Visit Diagnosis: Unsteadiness on feet (R26.81);Muscle weakness (generalized) (M62.81);Pain   Activity Tolerance Patient limited by pain   Patient Left in bed;with call bell/phone within reach;with bed alarm set   Nurse Communication Mobility status;Precautions        Time: 5625-6389 OT Time Calculation (min): 13 min  Charges: OT General Charges $OT Visit: 1 Visit OT Treatments $Therapeutic Activity: 8-22 mins   Brynn, OTR/L  Acute Rehabilitation Services Pager: [phone] Office: [phone] .    Jeri Modena 04/02/2021,  1:59 PM

## 2021-04-02 NOTE — Progress Notes (Signed)
Physical Therapy Treatment Patient Details Name: [name] MRN: [id number] DOB: [birth year] Today's Date: 04/02/2021    History of Present Illness 75 y/o s/p fall from bed with intractable back pain and second fall noted in chart from falling in mud puddle striking head against car on 5/23. T12 acute compression fx with mild loss height, multiple TP fx R T2-8 L5 with orders for TLSO PMH s/p PLIF L5-S1 on 4/8. anxiety, CHF, CKD, chronic pain syndrome, depression, DM type II, GERD, HTN, HLD, IBS, seizure, sleep apnea    PT Comments    Pt progressing towards her physical therapy goals, although pain remains limiting factor. Pt requiring min assist overall for functional mobility; ambulating x 30 feet with a walker. Presents as a high fall risk based on history of falls and decreased gait speed. Continue to recommend SNF for ongoing Physical Therapy.      Follow Up Recommendations  SNF;Supervision/Assistance - 24 hour     Equipment Recommendations  Wheelchair (measurements PT);Wheelchair cushion (measurements PT)    Recommendations for Other Services       Precautions / Restrictions Precautions Precautions: Fall;Back Precaution Comments: back precautions for comfort Required Braces or Orthoses: Spinal Brace Spinal Brace: Thoracolumbosacral orthotic;Other (comment) Spinal Brace Comments: clamshell when OOB Restrictions Weight Bearing Restrictions: No    Mobility  Bed Mobility Overal bed mobility: Needs Assistance Bed Mobility: Rolling;Sidelying to Sit Rolling: Min assist Sidelying to sit: Min assist       General bed mobility comments: Use of bed pad to guide hips into right sidelying, minA at trunk to bring to upright positioning    Transfers Overall transfer level: Needs assistance Equipment used: Rolling walker (2 wheeled) Transfers: Sit to/from Stand Sit to Stand: Min assist         General transfer comment: MinA to rise from edge of bed, pt pushing up on  walker so stabilized  Ambulation/Gait Ambulation/Gait assistance: Min guard Gait Distance (Feet): 30 Feet Assistive device: Rolling walker (2 wheeled) Gait Pattern/deviations: Shuffle;Trunk flexed;Step-through pattern Gait velocity: decreased Gait velocity interpretation: <1.8 ft/sec, indicate of risk for recurrent falls General Gait Details: Min guard for safety, no gross instability noted   Stairs             Wheelchair Mobility    Modified Rankin (Stroke Patients Only)       Balance Overall balance assessment: Needs assistance Sitting-balance support: Feet supported Sitting balance-Leahy Scale: Fair     Standing balance support: Bilateral upper extremity supported;During functional activity Standing balance-Leahy Scale: Poor Standing balance comment: reliants on RW                            Cognition Arousal/Alertness: Awake/alert Behavior During Therapy: WFL for tasks assessed/performed Overall Cognitive Status: No family/caregiver present to determine baseline cognitive functioning Area of Impairment: Memory                     Memory: Decreased short-term memory         General Comments: Pt stating that she will now have surgery on her back, but no mention of that in the chart      Exercises      General Comments        Pertinent Vitals/Pain Pain Assessment: 0-10 Pain Score: 8  Pain Location: back Pain Descriptors / Indicators: Spasm;Guarding;Grimacing Pain Intervention(s): Limited activity within patient's tolerance;Monitored during session;Premedicated before session;Repositioned    Home Living  Prior Function            PT Goals (current goals can now be found in the care plan section) Acute Rehab PT Goals Patient Stated Goal: reduce pain PT Goal Formulation: With patient Time For Goal Achievement: 04/13/21 Potential to Achieve Goals: Good Progress towards PT goals: Progressing  toward goals    Frequency    Min 3X/week      PT Plan Current plan remains appropriate    Co-evaluation              AM-PAC PT "6 Clicks" Mobility   Outcome Measure  Help needed turning from your back to your side while in a flat bed without using bedrails?: A Little Help needed moving from lying on your back to sitting on the side of a flat bed without using bedrails?: A Little Help needed moving to and from a bed to a chair (including a wheelchair)?: A Little Help needed standing up from a chair using your arms (e.g., wheelchair or bedside chair)?: A Little Help needed to walk in hospital room?: A Little Help needed climbing 3-5 steps with a railing? : A Lot 6 Click Score: 17    End of Session Equipment Utilized During Treatment: Gait belt Activity Tolerance: Patient limited by pain Patient left: with call bell/phone within reach;in bed;with bed alarm set Nurse Communication: Mobility status PT Visit Diagnosis: Other abnormalities of gait and mobility (R26.89);Pain     Time: 3833-3832 PT Time Calculation (min) (ACUTE ONLY): 12 min  Charges:  $Therapeutic Activity: 8-22 mins                     [name], PT, DPT Acute Rehabilitation Services Pager [phone] Office [phone]    [name] 04/02/2021, 1:36 PM

## 2021-04-03 ENCOUNTER — Inpatient Hospital Stay (HOSPITAL_COMMUNITY): Payer: Medicare Other

## 2021-04-03 LAB — GLUCOSE, CAPILLARY
Glucose-Capillary: 192 mg/dL — ABNORMAL HIGH (ref 70–99)
Glucose-Capillary: 214 mg/dL — ABNORMAL HIGH (ref 70–99)
Glucose-Capillary: 199 mg/dL — ABNORMAL HIGH (ref 70–99)
Glucose-Capillary: 248 mg/dL — ABNORMAL HIGH (ref 70–99)

## 2021-04-03 NOTE — Progress Notes (Signed)
Neurosurgery Service Progress Note  Subjective: No acute events overnight   Objective: Vitals:   04/02/21 1500 04/02/21 1700 04/02/21 2002 04/02/21 2314  BP: (!) 163/80  (!) 114/98 (!) 117/57  Pulse: 87  81 85  Resp: 16 18    Temp: 98.7 F (37.1 C)  98.2 F (36.8 C) 98.3 F (36.8 C)  TempSrc:      SpO2: 95%  93% 93%    Physical Exam: AOx3, FS, TM, Strength 5/5 x4, SILTx4 except baseline b/l stocking neuropathy  Assessment & Plan: 75 y.o. woman w/ prior lumbar fusion s/p fall with multiple TP frx and comp frx.  -SNF discharge pending -[place]  04/03/21 12:48 AM

## 2021-04-03 NOTE — Progress Notes (Signed)
   Providing Compassionate, Quality Care - Together  NEUROSURGERY PROGRESS NOTE   S: No issues overnight. Pain controlled, mainly complains of spasms  O: EXAM:  BP 107/71 (BP Location: Right Wrist)   Pulse 87   Temp 98.2 F (36.8 C)   Resp 16   SpO2 98%   Awake, alert, oriented  Speech fluent, appropriate  CNs grossly intact  5/5 BUE/BLE  PERRL TLSO brace in place  ASSESSMENT:  75 y.o. female with  1. T12 acutecompression fracture with mild loss of height 2.Multiple TP fractures, right, T2-8, L5 3.History of L4-S1 fusion  PLAN: - TLSO when OOB - pain control - PT/OT, refusing SNF, ordering HHC - XR pending, plan dc tomorrow  Thank you for allowing me to participate in this patient's care.  Please do not hesitate to call with questions or concerns.   Elwin Sleight, [facility] Cell: [phone]

## 2021-04-03 NOTE — Progress Notes (Signed)
Inpatient Diabetes Program Recommendations  AACE/ADA: New Consensus Statement on Inpatient Glycemic Control (2015)  Target Ranges:  Prepandial:   less than 140 mg/dL      Peak postprandial:   less than 180 mg/dL (1-2 hours)      Critically ill patients:  140 - 180 mg/dL   Lab Results  Component Value Date   GLUCAP 192 (H) 04/03/2021   HGBA1C 6.6 (H) 02/09/2021    Review of Glycemic Control Results for ADA, HOLNESS (MRN [id number]) as of 04/03/2021 11:05  Ref. Range 04/02/2021 07:16 04/02/2021 11:11 04/02/2021 17:11 04/02/2021 21:43 04/03/2021 07:21  Glucose-Capillary Latest Ref Range: 70 - 99 mg/dL 181 (H) 258 (H) 233 (H) 250 (H) 192 (H)    Current orders for Inpatient glycemic control:  Lantus 72 units QHS Novolog 0-20 units TID  Inpatient Diabetes Program Recommendations:    Please consider:  Novolog 3 units TID with meals if eats at least 50%.  Will continue to follow while inpatient.  Thank you, Reche Dixon, RN, BSN Diabetes Coordinator Inpatient Diabetes Program [phone] (team pager from 8a-5p)

## 2021-04-03 NOTE — TOC Progression Note (Addendum)
Transition of Care [facility]) - Progression Note    Patient Details  Name: [name] MRN: [id number] Date of Birth: 05-31-1946  Transition of Care [facility]) CM/SW [place], [place] Phone Number: 04/03/2021, 10:31 AM  Clinical Narrative:     CSW met with pt and provided SNF offers. She explains she is still on the fence about whether or not she will do SNF. States she wants to go home but is considering SNF if a single room is available. She had a friend go to [facility] and that would be her first choice.   CSW called [facility]. They will not have beds available until Thursday or Friday.  [facility] will not have single rooms or any beds available until end of week.  [facility] does not have any beds available today. Stated they may have bed available tomorrow but could not confirm  CSW contacted other offer[facility] regarding single room availability. Awaiting response.   1115: [facility] has a single room available today but they cannot hold it. CSW met with pt and explained single room being available and need for decision between home with [facility] and SNF for rehab. Pt states she does not want SNF and wants to go home with home health. She has no preference for [facility]. She states she has support at home through her daughter and friend [name]. Pt states her daughter were drive her home. She consented to [facility] contacting daughter.   CSW called daughter who confirmed that she and [name] would be providing 24/7 supervision and assistance at home. Daughter confirmed she could transport. CSW informed that pt may be dc'd today or tomorrow. TOC will arrange [facility] and continue to follow for d/c needs.   1356: HH PT/OT arranged with [facility]  Expected Discharge Plan: Skilled Nursing Facility Barriers to Discharge: Continued Medical Work up  Expected Discharge Plan and Services Expected Discharge Plan: [place] In-house Referral: Clinical Social Work                                              Social Determinants of Health (SDOH) Interventions    Readmission Risk Interventions Readmission Risk Prevention Plan 03/07/2020  Transportation Screening Complete  PCP or Specialist Appt within 3-5 Days Complete  HRI or [facility] Complete  Social Work Consult for [place] Planning/Counseling Complete  Palliative Care Screening Not Applicable  Medication Review [employer]) Complete  Some recent data might be hidden

## 2021-04-04 LAB — GLUCOSE, CAPILLARY
Glucose-Capillary: 179 mg/dL — ABNORMAL HIGH (ref 70–99)
Glucose-Capillary: 179 mg/dL — ABNORMAL HIGH (ref 70–99)

## 2021-04-04 MED ORDER — METHOCARBAMOL 750 MG PO TABS: 750.0000 mg | ORAL_TABLET | Freq: Three times a day (TID) | ORAL | 1 refills | Status: DC

## 2021-04-04 MED ORDER — OXYCODONE HCL 5 MG PO TABS: 5.0000 mg | ORAL_TABLET | ORAL | 0 refills | Status: DC | PRN

## 2021-04-04 NOTE — Care Management Important Message (Signed)
Important Message  Patient Details  Name: [name] MRN: [id number] Date of Birth: [birth year]   Medicare Important Message Given:  Yes     Orbie Pyo 04/04/2021, 2:52 PM

## 2021-04-04 NOTE — Progress Notes (Signed)
IV removed, D/C instructions reviewed with patient.  Pt to be d/c home with family via wheelchair.

## 2021-04-04 NOTE — Care Management Important Message (Signed)
Important Message  Patient Details  Name: [name] MRN: [id number] Date of Birth: 28-Apr-1946   Medicare Important Message Given:  Yes Patient left prior to IM delivery will mail to home address.    Glorya Bartley 04/04/2021, 2:50 PM

## 2021-04-04 NOTE — Care Management (Signed)
    Durable Medical Equipment  (From admission, onward)         Start     Ordered   04/04/21 1059  For home use only DME lightweight manual wheelchair with seat cushion  Once       Comments: Patient suffers from weakness which impairs their ability to perform daily activities like walking in the home.  A walker will not resolve  issue with performing activities of daily living. A lightweight wheelchair will allow patient to safely perform daily activities. Patient is not able to propel themselves in the home using a standard weight wheelchair due to weakness. Patient can self propel in the lightweight wheelchair. Length of need lifetime Accessories: elevating leg rests (ELRs), wheel locks, extensions and anti-tippers.   04/04/21 1100

## 2021-04-04 NOTE — Discharge Summary (Signed)
Physician Discharge Summary  Patient ID: [name] MRN: [id number] DOB/AGE: [birth year] 75 y.o.  Admit date: 03/29/2021 Discharge date: 04/04/2021  Admission Diagnoses:  1.  T12 fracture 2.  Fall  Discharge Diagnoses:  Same Active Problems:   Fracture   Discharged Condition: Stable  Hospital Course:  [name] is a 75 y.o. female with a history of an L4-S1 fusion, she was recovering well until she had a ground-level fall at home.  Unfortunately she hit her back and was found to have a T12 fracture and multiple transverse processes fractures.  She was admitted to the hospital for pain control, provided a TLSO brace and evaluated by PT/OT.  Ultimately rehab was recommended however she wanted to go home with home health care and family support.  Her pain was controlled with oral medications upon discharge.  She was at her neurologic baseline upon discharge.  She was ambulating well with her brace and a walker.  Treatments: TLSO bracing  Discharge Exam: Blood pressure 130/66, pulse 99, temperature 97.8 F (36.6 C), resp. rate 16, SpO2 100 %. Awake, alert, oriented Speech fluent, appropriate CN grossly intact 5/5 BUE/BLE PERRLA EOMI  Disposition: Discharge disposition: 06-Home-Health Care Svc        Allergies as of 04/04/2021   No Known Allergies     Medication List    STOP taking these medications   nitrofurantoin 100 MG capsule Commonly known as: Macrodantin   oxyCODONE-acetaminophen 10-325 MG tablet Commonly known as: Percocet     TAKE these medications   atorvastatin 40 MG tablet Commonly known as: LIPITOR Take 40 mg by mouth daily.   Belsomra 15 MG Tabs Generic drug: Suvorexant Take 15 mg by mouth daily.   busPIRone 30 MG tablet Commonly known as: BUSPAR Take 30 mg by mouth 2 (two) times daily.   DULoxetine 60 MG capsule Commonly known as: CYMBALTA Take 60 mg by mouth in the morning.   ferrous sulfate 325 (65 FE) MG tablet Take 325 mg  by mouth 2 (two) times daily with a meal.   fluticasone 50 MCG/ACT nasal spray Commonly known as: FLONASE Place 1-2 sprays into both nostrils daily as needed for allergies or rhinitis.   gabapentin 600 MG tablet Commonly known as: NEURONTIN Take 1,200 mg by mouth 2 (two) times daily. What changed: Another medication with the same name was removed. Continue taking this medication, and follow the directions you see here.   Lantus SoloStar 100 UNIT/ML Solostar Pen Generic drug: insulin glargine Inject 15 Units into the skin at bedtime. What changed: how much to take   levothyroxine 75 MCG tablet Commonly known as: SYNTHROID Take 1 tablet (75 mcg total) by mouth daily before breakfast.   lisinopril 30 MG tablet Commonly known as: ZESTRIL Take 30 mg by mouth daily. What changed: Another medication with the same name was removed. Continue taking this medication, and follow the directions you see here.   methocarbamol 750 MG tablet Commonly known as: ROBAXIN Take 1 tablet (750 mg total) by mouth every 8 (eight) hours.   metoprolol succinate 50 MG 24 hr tablet Commonly known as: TOPROL-XL Take 50 mg by mouth daily. Take with or immediately following a meal.   montelukast 10 MG tablet Commonly known as: SINGULAIR Take 10 mg by mouth daily as needed (allergy).   nystatin powder Commonly known as: MYCOSTATIN/NYSTOP Apply 1 application topically 2 (two) times daily.   Oscal 500/200 D-3 500-200 MG-UNIT tablet Generic drug: calcium-vitamin D Take 1 tablet by  mouth 2 (two) times daily.   oxyCODONE 5 MG immediate release tablet Commonly known as: Oxy IR/ROXICODONE Take 1 tablet (5 mg total) by mouth every 4 (four) hours as needed for moderate pain.   pantoprazole 40 MG tablet Commonly known as: PROTONIX Take 40 mg by mouth daily as needed (heartburn).   QUEtiapine 200 MG tablet Commonly known as: SEROQUEL Take 200 mg by mouth at bedtime.   topiramate 200 MG tablet Commonly  known as: TOPAMAX Take 200 mg by mouth at bedtime.   trimethoprim 100 MG tablet Commonly known as: TRIMPEX Take 1 tablet (100 mg total) by mouth daily.   Viibryd 40 MG Tabs Generic drug: Vilazodone HCl Take 40 mg by mouth daily.       Follow-up Information    Care, [facility] Follow up.   Specialty: [place] Why: Follow up with [facility] for [facility] PT/OT Contact information: [place] [address] [phone]        Earnie Larsson, MD Follow up in 3 week(s).   Specialty: Neurosurgery Contact information: [address] [place] [address] [phone]               Signed: Theodoro Doing Dax Murguia 04/04/2021, 8:34 AM

## 2021-04-04 NOTE — Progress Notes (Signed)
Physical Therapy Treatment Patient Details Name: [name] MRN: [id number] DOB: [birth year] Today's Date: 04/04/2021    History of Present Illness 75 y/o s/p fall from bed with intractable back pain and second fall noted in chart from falling in mud puddle striking head against car on 5/23. T12 acute compression fx with mild loss height, multiple TP fx R T2-8 L5 with orders for TLSO PMH s/p PLIF L5-S1 on 4/8. anxiety, CHF, CKD, chronic pain syndrome, depression, DM type II, GERD, HTN, HLD, IBS, seizure, sleep apnea    PT Comments    Pt progressing towards her physical therapy goals; exhibits improved pain control and activity tolerance. Requiring totalA for donning brace; reviewed instructing caregiver on application. Pt ambulating x 80 feet with a walker at a min guard assist level. HR up to 146 bpm. Pt presents as a high fall risk based on decreased gait speed and history of falls. Pt now reporting her daughter is unable to provide necessary supervision/assist and requesting SNF.     Follow Up Recommendations  SNF;Supervision/Assistance - 24 hour     Equipment Recommendations  Wheelchair (measurements PT);Wheelchair cushion (measurements PT)    Recommendations for Other Services       Precautions / Restrictions Precautions Precautions: Fall;Back Precaution Comments: back precautions for comfort Required Braces or Orthoses: Spinal Brace Spinal Brace: Thoracolumbosacral orthotic;Other (comment) Spinal Brace Comments: clamshell when OOB Restrictions Weight Bearing Restrictions: No    Mobility  Bed Mobility Overal bed mobility: Needs Assistance Bed Mobility: Rolling;Sidelying to Sit Rolling: Supervision Sidelying to sit: Supervision       General bed mobility comments: HOB elevated, use of bed rail, increased time and effort but no physical assist required    Transfers Overall transfer level: Needs assistance Equipment used: Rolling walker (2 wheeled) Transfers: Sit  to/from Stand Sit to Stand: Min guard         General transfer comment: Min guard to rise from edge of bed  Ambulation/Gait Ambulation/Gait assistance: Min guard Gait Distance (Feet): 80 Feet Assistive device: Rolling walker (2 wheeled) Gait Pattern/deviations: Shuffle;Trunk flexed;Step-through pattern;Decreased dorsiflexion - right;Decreased dorsiflexion - left Gait velocity: decreased Gait velocity interpretation: <1.8 ft/sec, indicate of risk for recurrent falls General Gait Details: Decreased bilateral foot clearance, particularly with fatigue and increased R foot external rotation. Good walker proximity, cues for segmental turning and activity pacing. Pt requiring ~3 short standing rest breaks. Min guard for stability   Stairs             Wheelchair Mobility    Modified Rankin (Stroke Patients Only)       Balance Overall balance assessment: Needs assistance Sitting-balance support: Feet supported Sitting balance-Leahy Scale: Fair     Standing balance support: Bilateral upper extremity supported;During functional activity Standing balance-Leahy Scale: Poor Standing balance comment: reliants on RW                            Cognition Arousal/Alertness: Awake/alert Behavior During Therapy: WFL for tasks assessed/performed Overall Cognitive Status: No family/caregiver present to determine baseline cognitive functioning Area of Impairment: Memory                     Memory: Decreased short-term memory                Exercises      General Comments        Pertinent Vitals/Pain Pain Assessment: Faces Faces Pain Scale: Hurts a little  bit Pain Location: back Pain Descriptors / Indicators: Guarding;Grimacing Pain Intervention(s): Monitored during session    Home Living                      Prior Function            PT Goals (current goals can now be found in the care plan section) Acute Rehab PT Goals Patient  Stated Goal: reduce pain PT Goal Formulation: With patient Time For Goal Achievement: 04/13/21 Potential to Achieve Goals: Good Progress towards PT goals: Progressing toward goals    Frequency    Min 3X/week      PT Plan Current plan remains appropriate    Co-evaluation              AM-PAC PT "6 Clicks" Mobility   Outcome Measure  Help needed turning from your back to your side while in a flat bed without using bedrails?: A Little Help needed moving from lying on your back to sitting on the side of a flat bed without using bedrails?: A Little Help needed moving to and from a bed to a chair (including a wheelchair)?: A Little Help needed standing up from a chair using your arms (e.g., wheelchair or bedside chair)?: A Little Help needed to walk in hospital room?: A Little Help needed climbing 3-5 steps with a railing? : A Lot 6 Click Score: 17    End of Session Equipment Utilized During Treatment: Gait belt Activity Tolerance: Patient tolerated treatment well Patient left: with call bell/phone within reach;in chair;with chair alarm set Nurse Communication: Mobility status PT Visit Diagnosis: Other abnormalities of gait and mobility (R26.89);Pain     Time: 1937-9024 PT Time Calculation (min) (ACUTE ONLY): 26 min  Charges:  $Gait Training: 8-22 mins $Therapeutic Activity: 8-22 mins                     [name], PT, DPT Acute Rehabilitation Services Pager [phone] Office [phone]    [name] 04/04/2021, 8:50 AM

## 2021-04-04 NOTE — Discharge Instructions (Signed)
Thoracic Spine Fracture A thoracic spine fracture is a break in one of the bones of the middle part of the back. The fracture can be mild or very bad. The most serious types cause the broken bones to:  Move out of place (unstable).  Damage or press on the main nerve in the spine (spinal cord). In some cases, the bone that connects to the lower part of the back may also have a break (thoracolumbar fracture). What are the causes? This condition may be caused by:  A car accident.  A fall.  A sports accident.  Violent acts. These include assaults or gunshots. What are the signs or symptoms? Symptoms may include:  Back pain.  Trouble standing or walking.  Numbness.  Tingling.  Weakness.  Loss of movement.  Being unable to control when to pee or poop (incontinence). How is this treated? Treatment may include:  Medicines.  A cast or a brace.  Physical therapy.  Surgery. This may be needed for very bad fractures. Follow these instructions at home: Medicines  Take medicines only as told by your doctor.  Do not drive or use heavy machinery while taking pain medicine.  To prevent or treat trouble pooping (constipation) while you are taking prescription pain medicine, your doctor may recommend that you: ? Drink enough fluid to keep your pee (urine) pale yellow. ? Take over-the-counter or prescription medicines. ? Eat foods that are high in fiber. This includes fresh fruits and vegetables, whole grains, and beans. ? Limit foods that are high in fat and processed sugars. This includes fried or sweet foods. If you have a brace:  Wear the back brace as told by your doctor. Remove it only as told by your doctor.  Keep the brace clean.  If the brace is not waterproof: ? Do not let it get wet. ? Cover it with a watertight covering when you take a bath or a shower. Activity  Stay in bed (on bed rest) only as told by your doctor.  Ask your doctor what is safe for you to  do.  Return to your normal activities as told by your doctor.  Do back exercises (physical therapy) as told by your doctor.  Exercise often as told by your doctor. Managing pain, stiffness, and swelling  If told, put ice on the injured area: ? Put ice in a plastic bag. ? Place a towel between your skin and the bag. ? Leave the ice on for 20 minutes, 2-3 times a day.   General instructions  Do not use any products that contain nicotine or tobacco, such as cigarettes and e-cigarettes. If you need help quitting, ask your doctor.  Do not drink alcohol.  Keep all follow-up visits as told by your doctor. This is important. Contact a doctor if:  You have a fever.  You have a cough that makes your pain worse.  Your pain medicine is not helping.  Your pain does not get better over time.  You cannot return to your normal activities as planned. Get help right away if:  Your pain is bad and it suddenly gets worse.  You are not able to move any part of your body (paralysis) that is below the level of your injury.  You have numbness, tingling, or weakness in any part of your body that is below the level of your injury.  You cannot control when you pee (urinate) or when you poop (pass stool). Summary  A thoracic spine fracture is a  break in one of the bones of the middle part of the back.  A stable fracture can be treated with a back brace, activity restrictions, pain medicine, and physical therapy. A more severe fracture may require surgery.  Make sure you know what symptoms should cause you to get help right away. This information is not intended to replace advice given to you by your health care provider. Make sure you discuss any questions you have with your health care provider. Document Revised: 12/05/2017 Document Reviewed: 12/05/2017 Elsevier Patient Education  [place].   Transverse Process Fracture  Bones of the spine (vertebrae) have portions that extend  off to either side of the spine. These portions of bone are called transverse processes. A transverse process fracture, which is also called a rotation spine fracture, is a break in a transverse process. What are the causes? This condition may be caused by:  A fall from a great height.  A car accident.  A sports injury.  A gunshot wound.  A hard, direct hit to the back. This kind of fracture often results from a sudden and severe bending of the spine to one side. Depending on the cause of the fracture, one or more bones may be affected. What increases the risk? You are more likely to develop this condition if:  You have thinning and loss of density in the bones (osteoporosis).  You play a contact sport. What are the signs or symptoms? The main symptom of this condition is back pain. The pain may:  Be felt on the side of the spine (flank) where the fracture is.  Get worse when you move or take a deep breath. How is this diagnosed? This condition may be diagnosed based on:  Your symptoms.  Your medical history.  A physical exam. You may also have other tests, including:  X-rays.  A CT scan.  MRI. How is this treated? Most transverse process fractures heal on their own with time and rest. Treatment may involve supportive care, such as:  Limiting activity.  Medicines, such as: ? Pain medicine. ? Muscle-relaxing medicine.  Physical therapy.  A neck or back brace. Follow these instructions at home: If you have a brace:  Wear the neck or back brace as told by your health care provider. Remove it only as told by your health care provider.  Keep the brace clean.  If the brace is not waterproof: ? Do not let it get wet. ? Cover it with a watertight covering when you take a bath or a shower. Managing pain, stiffness, and swelling  If directed, put ice on the injured area: ? If you have a removable brace, remove it as told by your health care provider. ? Put ice  in a plastic bag. ? Place a towel between your skin and the bag. ? Leave the ice on for 20 minutes, 2-3 times a day.   Medicines  Take over-the-counter and prescription medicines only as told by your health care provider.  Do not drive or use heavy machinery while taking prescription pain medicine.  If you are taking prescription pain medicine, take actions to prevent or treat constipation. Your health care provider may recommend that you: ? Drink enough fluid to keep your urine pale yellow. ? Eat foods that are high in fiber, such as fresh fruits and vegetables, whole grains, and beans. ? Limit foods that are high in fat and processed sugars, such as fried or sweet foods. ? Take an over-the-counter  or prescription medicine for constipation. Activity  Stay in bed (on bed rest) only as directed by your health care provider. ? Avoid being in bed for a long time without moving. Get up to take short walks every 1-2 hours. This is important to improve blood flow and breathing. Ask for help if you feel weak or unsteady.  Return to your normal activities when your health care provider says it is okay. Ask if there are any activities that you should not do.  Do physical therapy exercises as recommended by your health care provider. General instructions  Do not use any products that contain nicotine or tobacco, such as cigarettes and e-cigarettes. These can delay bone healing. If you need help quitting, ask your health care provider.  Keep all follow-up visits as told by your health care provider. This is important. Visits can help to prevent permanent injury, disability, and long-lasting (chronic) pain. Contact a health care provider if:  You have a fever.  You develop a cough that makes your pain worse.  Your pain medicine is not helping.  Your pain does not get better over time.  You cannot return to your normal activities as planned or expected. Get help right away if:  Your pain  is very bad and it suddenly gets worse.  You are unable to move any body part (paralysis) that is below the level of your injury.  You have numbness, tingling, or weakness in any body part that is below the level of your injury.  You cannot control your bladder or bowels. Summary  A transverse process fracture is a break in the portion of the bone that extends to the side of the spine.  Most transverse process fractures heal on their own with time and rest.  You may also have supportive treatments such as a back brace, pain medicines, and physical therapy.  Keep all follow-up visits. This is important and will help to prevent permanent injury, disability, and long-lasting (chronic) pain. This information is not intended to replace advice given to you by your health care provider. Make sure you discuss any questions you have with your health care provider. Document Revised: 12/03/2017 Document Reviewed: 12/03/2017 Elsevier Patient Education  2021 [employer].

## 2021-04-04 NOTE — TOC Progression Note (Signed)
Transition of Care [facility]) - Progression Note    Patient Details  Name: [name] MRN: [id number] Date of Birth: [birth year]  Transition of Care [facility]) CM/SW [place], [place] Phone Number: 04/04/2021, 10:18 AM  Clinical Narrative:     CSW is informed that pt changed her mind to SNF due to her daughter not being willing to assist at home. CSW met with pt to confirm this. Pt states that she does plan to go home. She states that her grandson [name] will assist her and that she hired a Industrial/product designer as well to assist with caregiving. Pt is agreeable to [facility] being delivered to her home. She states [name] will come pick her up at 1230pm today. Pt is arranged with [facility]. CSW contacted adapt for [facility]; they will deliver to the home.   Expected Discharge Plan: Skilled Nursing Facility Barriers to Discharge: Continued Medical Work up  Expected Discharge Plan and Services Expected Discharge Plan: [place] In-house Referral: Clinical Social Work       Expected Discharge Date: 04/04/21                                     Social Determinants of Health (SDOH) Interventions    Readmission Risk Interventions Readmission Risk Prevention Plan 03/07/2020  Transportation Screening Complete  PCP or Specialist Appt within 3-5 Days Complete  HRI or [place] Complete  Social Work Consult for [place] Planning/Counseling Complete  Palliative Care Screening Not Applicable  Medication Review Press photographer) Complete  Some recent data might be hidden

## 2021-05-11 ENCOUNTER — Inpatient Hospital Stay: Payer: Medicare Other

## 2021-05-11 ENCOUNTER — Encounter: Payer: Self-pay | Admitting: Oncology

## 2021-05-11 ENCOUNTER — Inpatient Hospital Stay: Payer: Medicare Other | Attending: Oncology | Admitting: Oncology

## 2021-05-11 ENCOUNTER — Other Ambulatory Visit: Payer: Self-pay

## 2021-05-11 VITALS — BP 126/86 | HR 83 | Temp 97.9°F | Resp 18 | Wt 242.1 lb

## 2021-05-11 DIAGNOSIS — G894 Chronic pain syndrome: Secondary | ICD-10-CM

## 2021-05-11 DIAGNOSIS — Z801 Family history of malignant neoplasm of trachea, bronchus and lung: Secondary | ICD-10-CM

## 2021-05-11 DIAGNOSIS — Z8051 Family history of malignant neoplasm of kidney: Secondary | ICD-10-CM | POA: Diagnosis not present

## 2021-05-11 DIAGNOSIS — E538 Deficiency of other specified B group vitamins: Secondary | ICD-10-CM | POA: Diagnosis not present

## 2021-05-11 DIAGNOSIS — Z79899 Other long term (current) drug therapy: Secondary | ICD-10-CM

## 2021-05-11 DIAGNOSIS — Z9049 Acquired absence of other specified parts of digestive tract: Secondary | ICD-10-CM

## 2021-05-11 DIAGNOSIS — Z809 Family history of malignant neoplasm, unspecified: Secondary | ICD-10-CM

## 2021-05-11 DIAGNOSIS — Z800 Family history of malignant neoplasm of digestive organs: Secondary | ICD-10-CM | POA: Diagnosis not present

## 2021-05-11 DIAGNOSIS — M549 Dorsalgia, unspecified: Secondary | ICD-10-CM

## 2021-05-11 DIAGNOSIS — D649 Anemia, unspecified: Secondary | ICD-10-CM | POA: Diagnosis not present

## 2021-05-11 DIAGNOSIS — R0602 Shortness of breath: Secondary | ICD-10-CM | POA: Diagnosis not present

## 2021-05-11 DIAGNOSIS — Z90721 Acquired absence of ovaries, unilateral: Secondary | ICD-10-CM

## 2021-05-11 DIAGNOSIS — R5383 Other fatigue: Secondary | ICD-10-CM | POA: Diagnosis not present

## 2021-05-11 LAB — RETIC PANEL
Immature Retic Fract: 14.3 % (ref 2.3–15.9)
RBC.: 3.72 MIL/uL — ABNORMAL LOW (ref 3.87–5.11)
Retic Count, Absolute: 88.9 10*3/uL (ref 19.0–186.0)
Retic Ct Pct: 2.4 % (ref 0.4–3.1)
Reticulocyte Hemoglobin: 34.2 pg (ref >27.9)

## 2021-05-11 LAB — CBC WITH DIFFERENTIAL/PLATELET
Abs Immature Granulocytes: 0.07 10*3/uL (ref 0.00–0.07)
Basophils Absolute: 0.1 10*3/uL (ref 0.0–0.1)
Basophils Relative: 1 %
Eosinophils Absolute: 0.3 10*3/uL (ref 0.0–0.5)
Eosinophils Relative: 5 %
HCT: 35.9 % — ABNORMAL LOW (ref 36.0–46.0)
Hemoglobin: 11 g/dL — ABNORMAL LOW (ref 12.0–15.0)
Immature Granulocytes: 1 %
Lymphocytes Relative: 26 %
Lymphs Abs: 1.7 10*3/uL (ref 0.7–4.0)
MCH: 29.2 pg (ref 26.0–34.0)
MCHC: 30.6 g/dL (ref 30.0–36.0)
MCV: 95.2 fL (ref 80.0–100.0)
Monocytes Absolute: 0.5 10*3/uL (ref 0.1–1.0)
Monocytes Relative: 8 %
Neutro Abs: 3.8 10*3/uL (ref 1.7–7.7)
Neutrophils Relative %: 59 %
Platelets: 159 10*3/uL (ref 150–400)
RBC: 3.77 MIL/uL — ABNORMAL LOW (ref 3.87–5.11)
RDW: 15.2 % (ref 11.5–15.5)
WBC: 6.4 10*3/uL (ref 4.0–10.5)
nRBC: 0 % (ref 0.0–0.2)

## 2021-05-11 LAB — IRON AND TIBC
Iron: 126 ug/dL (ref 28–170)
Saturation Ratios: 37 % — ABNORMAL HIGH (ref 10.4–31.8)
TIBC: 342 ug/dL (ref 250–450)
UIBC: 216 ug/dL

## 2021-05-11 LAB — FERRITIN: Ferritin: 49 ng/mL (ref 11–307)

## 2021-05-11 LAB — LACTATE DEHYDROGENASE: LDH: 121 U/L (ref 98–192)

## 2021-05-11 LAB — TECHNOLOGIST SMEAR REVIEW
Plt Morphology: NORMAL
RBC MORPHOLOGY: NORMAL
WBC MORPHOLOGY: NORMAL

## 2021-05-11 LAB — TSH: TSH: 1.335 u[IU]/mL (ref 0.350–4.500)

## 2021-05-11 NOTE — Progress Notes (Signed)
Pt here to establish care.

## 2021-05-12 NOTE — Progress Notes (Signed)
Hematology/Oncology Consult note [facility] Telephone:([phone] [phone]   Patient Care Team: [place] as PCP - General  REFERRING PROVIDER: Harper  CHIEF COMPLAINTS/REASON FOR VISIT:  Evaluation of anemia  HISTORY OF PRESENTING ILLNESS:  [name] is a  75 y.o.  female with PMH listed below who was referred to me for evaluation of anemia Reviewed patient's recent labs that was done.  03/31/2021 Labs revealed anemia with hemoglobin of 8.7, MCV 95.9.   Reviewed patient's previous labs ordered by primary care physician's office, anemia is chronic onset , duration is since 2009 No aggravating or improving factors.  Associated signs and symptoms: Patient reports fatigue and SOB with exertion.  Denies weight loss, easy bruising, hematochezia, hemoptysis, hematuria.  5/262022 recent hospitalization due to T12 fracture, pain control.    Review of Systems  Constitutional:  Positive for fatigue. Negative for appetite change, chills and fever.  HENT:   Negative for hearing loss and voice change.   Eyes:  Negative for eye problems.  Respiratory:  Positive for shortness of breath. Negative for chest tightness and cough.   Cardiovascular:  Negative for chest pain.  Gastrointestinal:  Negative for abdominal distention, abdominal pain and blood in stool.  Endocrine: Negative for hot flashes.  Genitourinary:  Negative for difficulty urinating and frequency.   Musculoskeletal:  Positive for back pain. Negative for arthralgias.  Skin:  Negative for itching and rash.  Neurological:  Negative for extremity weakness.  Hematological:  Negative for adenopathy.  Psychiatric/Behavioral:  Negative for confusion.     MEDICAL HISTORY:  Past Medical History:  Diagnosis Date   Amnesia    Anxiety    Arthritis    Back pain    CHF (congestive heart failure) (HCC)    Chronic kidney disease    Acute on chronic 10/2020   Chronic  pain syndrome    Cystocele    Depression    Diabetes mellitus without complication (HCC)    Type II   GERD (gastroesophageal reflux disease)    History of blood transfusion    with knee placements   Hyperlipidemia    Hypertension    IBS (irritable bowel syndrome)    Obesity    Osteoporosis    Osteoporosis    Panic attacks    Seizure (HCC)    Shortness of breath dyspnea    Sleep apnea    not using now   Thyroid nodule     SURGICAL HISTORY: Past Surgical History:  Procedure Laterality Date   ABDOMINAL HYSTERECTOMY     APPENDECTOMY     BACK SURGERY     CARDIAC CATHETERIZATION     CHOLECYSTECTOMY     CORONARY STENT INTERVENTION N/A 05/05/2018   Procedure: CORONARY STENT INTERVENTION;  Surgeon: Isaias Cowman, MD;  Location: [facility];  Service: Cardiovascular;  Laterality: N/A;   FOOT SURGERY     LEFT HEART CATH AND CORONARY ANGIOGRAPHY Left 05/05/2018   Procedure: LEFT HEART CATH AND CORONARY ANGIOGRAPHY;  Surgeon: Isaias Cowman, MD;  Location: [facility];  Service: Cardiovascular;  Laterality: Left;   OOPHORECTOMY     REPLACEMENT TOTAL KNEE     TONSILLECTOMY      SOCIAL HISTORY: Social History   Socioeconomic History   Marital status: Widowed    Spouse name: Not on file   Number of children: Not on file   Years of education: Not on file   Highest education level: Not on file  Occupational History   Not on file  Tobacco Use   Smoking status: Never   Smokeless tobacco: Never  Vaping Use   Vaping Use: Never used  Substance and Sexual Activity   Alcohol use: Never   Drug use: Never   Sexual activity: Not on file  Other Topics Concern   Not on file  Social History Narrative   Not on file   Social Determinants of Health   Financial Resource Strain: Not on file  Food Insecurity: Not on file  Transportation Needs: Not on file  Physical Activity: Not on file  Stress: Not on file  Social Connections: Not on file  Intimate  Partner Violence: Not on file    FAMILY HISTORY: Family History  Problem Relation Age of Onset   Stomach cancer Mother    Cancer Father    Cancer - Lung Father    Kidney cancer Sister    Thyroid disease Neg Hx     ALLERGIES:  has No Known Allergies.  MEDICATIONS:  Current Outpatient Medications  Medication Sig Dispense Refill   aspirin EC 81 MG tablet Take 81 mg by mouth daily. Swallow whole.     atorvastatin (LIPITOR) 40 MG tablet Take 40 mg by mouth daily.     BELSOMRA 15 MG TABS Take 15 mg by mouth daily.     busPIRone (BUSPAR) 30 MG tablet Take 30 mg by mouth 2 (two) times daily.     Cholecalciferol 25 MCG (1000 UT) tablet Take 1,000 Units by mouth daily.     furosemide (LASIX) 40 MG tablet Take 40 mg by mouth.     gabapentin (NEURONTIN) 600 MG tablet Take 1,200 mg by mouth 2 (two) times daily.     LANTUS SOLOSTAR 100 UNIT/ML Solostar Pen Inject 15 Units into the skin at bedtime. (Patient taking differently: Inject 72 Units into the skin at bedtime.) 15 mL 11   levothyroxine (SYNTHROID) 75 MCG tablet Take 1 tablet (75 mcg total) by mouth daily before breakfast. 30 tablet 1   lisinopril (ZESTRIL) 30 MG tablet Take 30 mg by mouth daily.     metFORMIN (GLUCOPHAGE) 1000 MG tablet Take 1,000 mg by mouth 2 (two) times daily with a meal.     metoprolol succinate (TOPROL-XL) 50 MG 24 hr tablet Take 50 mg by mouth daily. Take with or immediately following a meal.     nystatin (MYCOSTATIN/NYSTOP) powder Apply 1 application topically 2 (two) times daily.     oxybutynin (DITROPAN) 5 MG tablet Take 5 mg by mouth 3 (three) times daily.     QUEtiapine (SEROQUEL) 200 MG tablet Take 200 mg by mouth at bedtime.     topiramate (TOPAMAX) 200 MG tablet Take 200 mg by mouth at bedtime.     VIIBRYD 40 MG TABS Take 40 mg by mouth daily.     calcium-vitamin D (OSCAL 500/200 D-3) 500-200 MG-UNIT tablet Take 1 tablet by mouth 2 (two) times daily. (Patient not taking: Reported on 05/11/2021) 180 tablet 3    DULoxetine (CYMBALTA) 60 MG capsule Take 60 mg by mouth in the morning. (Patient not taking: Reported on 05/11/2021)     ferrous sulfate 325 (65 FE) MG tablet Take 325 mg by mouth 2 (two) times daily with a meal. (Patient not taking: Reported on 05/11/2021)     methocarbamol (ROBAXIN) 750 MG tablet Take 1 tablet (750 mg total) by mouth every 8 (eight) hours. (Patient not taking: Reported on 05/11/2021) 90 tablet 1   oxyCODONE (OXY  IR/ROXICODONE) 5 MG immediate release tablet Take 1 tablet (5 mg total) by mouth every 4 (four) hours as needed for moderate pain. (Patient not taking: Reported on 05/11/2021) 30 tablet 0   pantoprazole (PROTONIX) 40 MG tablet Take 40 mg by mouth daily as needed (heartburn). (Patient not taking: Reported on 05/11/2021)     trimethoprim (TRIMPEX) 100 MG tablet Take 1 tablet (100 mg total) by mouth daily. (Patient not taking: Reported on 05/11/2021) 30 tablet 11   No current facility-administered medications for this visit.     PHYSICAL EXAMINATION: ECOG PERFORMANCE STATUS: 2 - Symptomatic, <50% confined to bed Vitals:   05/11/21 1249  BP: 126/86  Pulse: 83  Resp: 18  Temp: 97.9 F (36.6 C)   Filed Weights   05/11/21 1249  Weight: 242 lb 1.6 oz (109.8 kg)    Physical Exam Constitutional:      General: She is not in acute distress.    Comments: She sits in the wheelchair  HENT:     Head: Normocephalic and atraumatic.  Eyes:     General: No scleral icterus. Cardiovascular:     Rate and Rhythm: Normal rate and regular rhythm.     Heart sounds: Normal heart sounds.  Pulmonary:     Effort: Pulmonary effort is normal. No respiratory distress.     Breath sounds: No wheezing.  Abdominal:     General: Bowel sounds are normal. There is no distension.     Palpations: Abdomen is soft.  Musculoskeletal:        General: No deformity. Normal range of motion.     Cervical back: Normal range of motion and neck supple.  Skin:    General: Skin is warm and dry.     Findings:  No erythema or rash.  Neurological:     Mental Status: She is alert and oriented to person, place, and time. Mental status is at baseline.     Cranial Nerves: No cranial nerve deficit.     Coordination: Coordination normal.  Psychiatric:        Mood and Affect: Mood normal.     LABORATORY DATA:  I have reviewed the data as listed Lab Results  Component Value Date   WBC 6.4 05/11/2021   HGB 11.0 (L) 05/11/2021   HCT 35.9 (L) 05/11/2021   MCV 95.2 05/11/2021   PLT 159 05/11/2021   Recent Labs    06/14/20 0526 06/15/20 0616 06/16/20 0504 02/09/21 0650 02/16/21 0544 02/17/21 0523 03/29/21 0240 03/29/21 1848 03/31/21 1644 04/01/21 0343  NA 144 146* 145   < > 141   < > 139 140 135 139  K 4.5 3.7 3.5   < > 4.8   < > 4.3 4.2 5.1 4.6  CL 108 110 109   < > 111   < > 111 113* 108 111  CO2 25 26 25    < > 21*   < > 20* 21* 19* 22  GLUCOSE 237* 215* 188*   < > 131*   < > 226* 170* 251* 170*  BUN 60* 29* 19   < > 28*   < > 24* 24* 26* 23  CREATININE 1.70* 1.07* 0.97   < > 1.19*   < > 1.28* 1.20* 1.40* 1.06*  CALCIUM 9.2 8.8* 8.8*   < > 8.9   < > 8.8* 8.5* 8.9 9.2  GFRNONAA 29* 51* 58*   < > 48*   < > 44* 48* 40* 55*  GFRAA 34* 60* >  60  --   --   --   --   --   --   --   PROT  --   --   --    < > 6.4*  --  6.7  --  5.7*  --   ALBUMIN 4.0  --   --    < > 3.2*  --  3.5  --  2.7*  --   AST  --   --   --    < > 23  --  32  --  19  --   ALT  --   --   --    < > 15  --  23  --  25  --   ALKPHOS  --   --   --    < > 60  --  107  --  87  --   BILITOT  --   --   --    < > 1.0  --  1.0  --  0.8  --    < > = values in this interval not displayed.   Iron/TIBC/Ferritin/ %Sat    Component Value Date/Time   IRON 126 05/11/2021 1320   TIBC 342 05/11/2021 1320   FERRITIN 49 05/11/2021 1320   IRONPCTSAT 37 (H) 05/11/2021 1320        ASSESSMENT & PLAN:  1. Normocytic anemia   2. B12 deficiency     Anemia: multifactorial with possible causes including chronic blood loss,  hyper/hypothyroidism, nutritional deficiency, infection/chronic inflammation, hemolysis, underlying bone marrow disorders. Will check CBC w differential, CMP, vitamin B12, Folate, iron/TIBC, ferritin, reticulocytes, fecal occult, blood smear, TSH,  LDH, haptoglobin, monoclonal gammopathy evaluation.   - cbc showed improved hemoglobin of 11 - B12 deficiency, recommend her to start parental B12 injections. Check anti parietal antibody and intrinsic factor antibody.   Addendum Labs are reviewed  Hemoglobin has increased to 11. Likely due to CKD.  SPEP showed no M protein. Light chain ratio elevated, expected in CKD.  Will arrange patient to start parental B12 injection. Intrinsic factor +.  Follow up in 3 months.   Orders Placed This Encounter  Procedures   Iron and TIBC    Standing Status:   Future    Number of Occurrences:   1    Standing Expiration Date:   05/11/2022   Ferritin    Standing Status:   Future    Number of Occurrences:   1    Standing Expiration Date:   11/11/2021   TSH    Standing Status:   Future    Number of Occurrences:   1    Standing Expiration Date:   05/11/2022   Technologist smear review    Standing Status:   Future    Number of Occurrences:   1    Standing Expiration Date:   05/11/2022   CBC with Differential/Platelet    Standing Status:   Future    Number of Occurrences:   1    Standing Expiration Date:   05/11/2022   Retic Panel    Standing Status:   Future    Number of Occurrences:   1    Standing Expiration Date:   05/11/2022   Multiple Myeloma Panel (SPEP&IFE w/QIG)    Standing Status:   Future    Number of Occurrences:   1    Standing Expiration Date:   05/11/2022   Kappa/lambda light chains    Standing Status:  Future    Number of Occurrences:   1    Standing Expiration Date:   05/11/2022   Lactate dehydrogenase    Standing Status:   Future    Number of Occurrences:   1    Standing Expiration Date:   05/11/2022   Anti-parietal antibody    Standing  Status:   Future    Number of Occurrences:   1    Standing Expiration Date:   05/11/2022   Intrinsic Factor Antibodies    Standing Status:   Future    Number of Occurrences:   1    Standing Expiration Date:   05/11/2022    All questions were answered. The patient knows to call the clinic with any problems questions or concerns. [place]  Return of visit: 4 months. Thank you for this kind referral and the opportunity to participate in the care of this patient. A copy of today's note is routed to referring provider    Earlie Server, MD, PhD 05/12/2021

## 2021-05-13 LAB — ANTI-PARIETAL ANTIBODY: Parietal Cell Antibody-IgG: 2.4 Units (ref 0.0–20.0)

## 2021-05-13 LAB — INTRINSIC FACTOR ANTIBODIES: Intrinsic Factor: 4.7 AU/mL — ABNORMAL HIGH (ref 0.0–1.1)

## 2021-05-14 ENCOUNTER — Encounter: Payer: Self-pay | Admitting: Urology

## 2021-05-14 ENCOUNTER — Ambulatory Visit (INDEPENDENT_AMBULATORY_CARE_PROVIDER_SITE_OTHER): Payer: Medicare Other | Admitting: Urology

## 2021-05-14 ENCOUNTER — Other Ambulatory Visit: Payer: Self-pay

## 2021-05-14 VITALS — BP 102/71 | HR 118

## 2021-05-14 DIAGNOSIS — N3281 Overactive bladder: Secondary | ICD-10-CM | POA: Diagnosis not present

## 2021-05-14 DIAGNOSIS — N3946 Mixed incontinence: Secondary | ICD-10-CM

## 2021-05-14 LAB — URINALYSIS, COMPLETE
Bilirubin, UA: NEGATIVE
Ketones, UA: NEGATIVE
Nitrite, UA: NEGATIVE
Protein,UA: NEGATIVE
RBC, UA: NEGATIVE
Specific Gravity, UA: 1.02 (ref 1.005–1.030)
Urobilinogen, Ur: 0.2 mg/dL (ref 0.2–1.0)
pH, UA: 5.5 (ref 5.0–7.5)

## 2021-05-14 LAB — MICROSCOPIC EXAMINATION
Epithelial Cells (non renal): >10 /hpf — ABNORMAL HIGH (ref 0–10)
WBC, UA: >30 /hpf — ABNORMAL HIGH (ref 0–5)

## 2021-05-14 LAB — KAPPA/LAMBDA LIGHT CHAINS
Kappa free light chain: 44.9 mg/L — ABNORMAL HIGH (ref 3.3–19.4)
Kappa, lambda light chain ratio: 1.68 — ABNORMAL HIGH (ref 0.26–1.65)
Lambda free light chains: 26.7 mg/L — ABNORMAL HIGH (ref 5.7–26.3)

## 2021-05-14 MED ORDER — NITROFURANTOIN MACROCRYSTAL 100 MG PO CAPS: 100.0000 mg | ORAL_CAPSULE | Freq: Every day | ORAL | 3 refills | Status: DC

## 2021-05-14 MED ORDER — CIPROFLOXACIN HCL 250 MG PO TABS: 250.0000 mg | ORAL_TABLET | Freq: Two times a day (BID) | ORAL | 0 refills | Status: DC

## 2021-05-14 NOTE — Progress Notes (Signed)
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  She leaks with coughing sneezing bending lifting.  She has urge incontinence.  No bedwetting.  She can soak 6 pads a day.  Urine runs down her leg when she stands.   She can void every 1-3 hours and goes hourly at night.  She has ankle edema   Flow was reasonable and she does not necessarily feel empty.  Sometimes she leans forward to try to empty better   She thinks she gets 10 infections a year.  Sometimes she gets confused.  Urine gets foul-smelling.   Positive cultures in medical record  Patient has mixed incontinence.  She has recurrent bladder infections.  I put her on prophylaxis today with trimethoprim 100 mg 3x11.  Reassess incontinence in 7 weeks to see if it down regulates.  Perform cystoscopy on that day.  Patient had normal renal ultrasound less than 1 year ago.  Perform pelvic examination on that day.  Today Frequency stable.  Last culture positive The last 24 hours patient states she is has a lot more frequency.  She thought she is infected again on the trimethoprim.  Patient underwent flexible cystoscopy utilizing sterile technique.  Bladder mucosa and trigone were normal.  Urine was very cloudy with a lot of white flecks and sediment.  No carcinoma  Reasonly well supported bladder neck and no stress incontinence.  Narrowing of introitus       PMH: Past Medical History:  Diagnosis Date   Amnesia    Anxiety    Arthritis    Back pain    CHF (congestive heart failure) (HCC)    Chronic kidney disease    Acute on chronic 10/2020   Chronic pain syndrome    Cystocele    Depression    Diabetes mellitus without complication (HCC)    Type II   GERD  (gastroesophageal reflux disease)    History of blood transfusion    with knee placements   Hyperlipidemia    Hypertension    IBS (irritable bowel syndrome)    Obesity    Osteoporosis    Osteoporosis    Panic attacks    Seizure (HCC)    Shortness of breath dyspnea    Sleep apnea    not using now   Thyroid nodule     Surgical History: Past Surgical History:  Procedure Laterality Date   ABDOMINAL HYSTERECTOMY     APPENDECTOMY     BACK SURGERY     CARDIAC CATHETERIZATION     CHOLECYSTECTOMY     CORONARY STENT INTERVENTION N/A 05/05/2018   Procedure: CORONARY STENT INTERVENTION;  Surgeon: Isaias Cowman, MD;  Location: [facility];  Service: Cardiovascular;  Laterality: N/A;   FOOT SURGERY     LEFT HEART CATH AND CORONARY ANGIOGRAPHY Left 05/05/2018   Procedure: LEFT HEART CATH AND CORONARY ANGIOGRAPHY;  Surgeon: Isaias Cowman, MD;  Location: [facility];  Service: Cardiovascular;  Laterality: Left;   OOPHORECTOMY     REPLACEMENT TOTAL KNEE     TONSILLECTOMY      Home Medications:  Allergies as of 05/14/2021   No Known Allergies      Medication List  Accurate as of May 14, 2021  2:45 PM. If you have any questions, ask your nurse or doctor.          aspirin EC 81 MG tablet Take 81 mg by mouth daily. Swallow whole.   atorvastatin 40 MG tablet Commonly known as: LIPITOR Take 40 mg by mouth daily.   Belsomra 15 MG Tabs Generic drug: Suvorexant Take 15 mg by mouth daily.   busPIRone 30 MG tablet Commonly known as: BUSPAR Take 30 mg by mouth 2 (two) times daily.   Cholecalciferol 25 MCG (1000 UT) tablet Take 1,000 Units by mouth daily.   DULoxetine 60 MG capsule Commonly known as: CYMBALTA Take 60 mg by mouth in the morning.   ferrous sulfate 325 (65 FE) MG tablet Take 325 mg by mouth 2 (two) times daily with a meal.   furosemide 40 MG tablet Commonly known as: LASIX Take 40 mg by mouth.   gabapentin 600 MG  tablet Commonly known as: NEURONTIN Take 1,200 mg by mouth 2 (two) times daily.   Lantus SoloStar 100 UNIT/ML Solostar Pen Generic drug: insulin glargine Inject 15 Units into the skin at bedtime. What changed: how much to take   levothyroxine 75 MCG tablet Commonly known as: SYNTHROID Take 1 tablet (75 mcg total) by mouth daily before breakfast.   lisinopril 30 MG tablet Commonly known as: ZESTRIL Take 30 mg by mouth daily.   metFORMIN 1000 MG tablet Commonly known as: GLUCOPHAGE Take 1,000 mg by mouth 2 (two) times daily with a meal.   methocarbamol 750 MG tablet Commonly known as: ROBAXIN Take 1 tablet (750 mg total) by mouth every 8 (eight) hours.   metoprolol succinate 50 MG 24 hr tablet Commonly known as: TOPROL-XL Take 50 mg by mouth daily. Take with or immediately following a meal.   nystatin powder Commonly known as: MYCOSTATIN/NYSTOP Apply 1 application topically 2 (two) times daily.   Oscal 500/200 D-3 500-200 MG-UNIT tablet Generic drug: calcium-vitamin D Take 1 tablet by mouth 2 (two) times daily.   oxybutynin 5 MG tablet Commonly known as: DITROPAN Take 5 mg by mouth 3 (three) times daily.   oxyCODONE 5 MG immediate release tablet Commonly known as: Oxy IR/ROXICODONE Take 1 tablet (5 mg total) by mouth every 4 (four) hours as needed for moderate pain.   pantoprazole 40 MG tablet Commonly known as: PROTONIX Take 40 mg by mouth daily as needed (heartburn).   QUEtiapine 200 MG tablet Commonly known as: SEROQUEL Take 200 mg by mouth at bedtime.   topiramate 200 MG tablet Commonly known as: TOPAMAX Take 200 mg by mouth at bedtime.   trimethoprim 100 MG tablet Commonly known as: TRIMPEX Take 1 tablet (100 mg total) by mouth daily.   Viibryd 40 MG Tabs Generic drug: Vilazodone HCl Take 40 mg by mouth daily.        Allergies: No Known Allergies  Family History: Family History  Problem Relation Age of Onset   Stomach cancer Mother     Cancer Father    Cancer - Lung Father    Kidney cancer Sister    Thyroid disease Neg Hx     Social History:  reports that she has never smoked. She has never used smokeless tobacco. She reports that she does not drink alcohol and does not use drugs.  ROS:  Physical Exam: There were no vitals taken for this visit.  Constitutional:  Alert and oriented, No acute distress. HEENT: [place] AT, moist mucus membranes.  Trachea midline, no masses. Cardiovascular: No clubbing, cyanosis, or edema. Respiratory: Normal respiratory effort, no increased work of breathing. GI: Abdomen is soft, nontender, nondistended, no abdominal masses GU: No CVA tenderness.  Skin: No rashes, bruises or suspicious lesions. Lymph: No cervical or inguinal adenopathy. Neurologic: Grossly intact, no focal deficits, moving all 4 extremities. Psychiatric: Normal mood and affect.  Laboratory Data: Lab Results  Component Value Date   WBC 6.4 05/11/2021   HGB 11.0 (L) 05/11/2021   HCT 35.9 (L) 05/11/2021   MCV 95.2 05/11/2021   PLT 159 05/11/2021    Lab Results  Component Value Date   CREATININE 1.06 (H) 04/01/2021    No results found for: PSA  No results found for: TESTOSTERONE  Lab Results  Component Value Date   HGBA1C 6.6 (H) 02/09/2021    Urinalysis    Component Value Date/Time   COLORURINE YELLOW (A) 02/15/2021 1057   APPEARANCEUR Cloudy (A) 03/19/2021 1445   LABSPEC 1.010 02/15/2021 1057   LABSPEC 1.024 01/30/2014 0026   PHURINE 5.0 02/15/2021 1057   GLUCOSEU 1+ (A) 03/19/2021 1445   GLUCOSEU >=500 01/30/2014 0026   HGBUR SMALL (A) 02/15/2021 1057   BILIRUBINUR Negative 03/19/2021 1445   BILIRUBINUR Negative 01/30/2014 0026   KETONESUR NEGATIVE 02/15/2021 1057   PROTEINUR Negative 03/19/2021 1445   PROTEINUR NEGATIVE 02/15/2021 1057   UROBILINOGEN 1.0 07/11/2008 1245   NITRITE Negative 03/19/2021 1445   NITRITE NEGATIVE  02/15/2021 1057   LEUKOCYTESUR Negative 03/19/2021 1445   LEUKOCYTESUR NEGATIVE 02/15/2021 1057   LEUKOCYTESUR Trace 01/30/2014 0026    Pertinent Imaging: Urine reviewed urine sent for culture.  Urine aspirated and sent for culture  Assessment & Plan: Based on last positive culture sent in ciprofloxacin to 50 mg twice a day for 7 days.  I will switch prophylaxis to daily Macrodantin 100 mg 3x11.  Reassess for down-regulation of frequency and nocturia and mixed incontinence in 6 or 7-week  1. OAB (overactive bladder)  - Urinalysis, Complete   No follow-ups on file.  Reece Packer, MD  [facility] [address], [place], [place] [phone]

## 2021-05-15 LAB — MULTIPLE MYELOMA PANEL, SERUM
Albumin SerPl Elph-Mcnc: 3.3 g/dL (ref 2.9–4.4)
Albumin/Glob SerPl: 1.1 (ref 0.7–1.7)
Alpha 1: 0.2 g/dL (ref 0.0–0.4)
Alpha2 Glob SerPl Elph-Mcnc: 1.1 g/dL — ABNORMAL HIGH (ref 0.4–1.0)
B-Globulin SerPl Elph-Mcnc: 1 g/dL (ref 0.7–1.3)
Gamma Glob SerPl Elph-Mcnc: 0.9 g/dL (ref 0.4–1.8)
Globulin, Total: 3.2 g/dL (ref 2.2–3.9)
IgA: 172 mg/dL (ref 64–422)
IgG (Immunoglobin G), Serum: 807 mg/dL (ref 586–1602)
IgM (Immunoglobulin M), Srm: 40 mg/dL (ref 26–217)
Total Protein ELP: 6.5 g/dL (ref 6.0–8.5)

## 2021-05-16 ENCOUNTER — Encounter: Payer: Self-pay | Admitting: Oncology

## 2021-05-17 ENCOUNTER — Telehealth: Payer: Self-pay

## 2021-05-17 LAB — CULTURE, URINE COMPREHENSIVE

## 2021-05-17 NOTE — Telephone Encounter (Signed)
-----   Message from Earlie Server, MD sent at 05/16/2021 10:57 PM EDT ----- Please let her know that her anemia level has improved to 11, for now no need to do IV iron. B12 is low, please schedule her to start B12 1033mcg monthly.   Follow up in 4 months, labs ordered, 1-2 days prior to MD +B12 injection.

## 2021-05-17 NOTE — Telephone Encounter (Signed)
Patient has been notified of MD recommendations.   Please schedule patient for monthly B12 inj (starting this week or next)   Labs in 4 months MD / B12 inj 1-2 days after labs.  Please inform pt of appts. This is a [place] pt.

## 2021-05-24 ENCOUNTER — Inpatient Hospital Stay: Payer: Medicare Other

## 2021-05-31 ENCOUNTER — Inpatient Hospital Stay: Payer: Medicare Other

## 2021-05-31 DIAGNOSIS — E538 Deficiency of other specified B group vitamins: Secondary | ICD-10-CM

## 2021-05-31 DIAGNOSIS — D649 Anemia, unspecified: Secondary | ICD-10-CM | POA: Diagnosis not present

## 2021-05-31 MED ORDER — CYANOCOBALAMIN 1000 MCG/ML IJ SOLN
1000.0000 ug | Freq: Once | INTRAMUSCULAR | Status: AC
  Administered 2021-05-31: 1000 ug via INTRAMUSCULAR
  Filled 2021-05-31: qty 1

## 2021-06-21 ENCOUNTER — Other Ambulatory Visit: Payer: Self-pay

## 2021-06-21 ENCOUNTER — Inpatient Hospital Stay: Payer: Medicare Other | Attending: Oncology

## 2021-06-21 DIAGNOSIS — E538 Deficiency of other specified B group vitamins: Secondary | ICD-10-CM | POA: Insufficient documentation

## 2021-06-21 DIAGNOSIS — D649 Anemia, unspecified: Secondary | ICD-10-CM | POA: Diagnosis not present

## 2021-06-21 MED ORDER — CYANOCOBALAMIN 1000 MCG/ML IJ SOLN
1000.0000 ug | Freq: Once | INTRAMUSCULAR | Status: AC
  Administered 2021-06-21: 1000 ug via INTRAMUSCULAR
  Filled 2021-06-21: qty 1

## 2021-06-25 ENCOUNTER — Other Ambulatory Visit: Payer: Self-pay

## 2021-06-25 ENCOUNTER — Encounter: Payer: Self-pay | Admitting: Urology

## 2021-06-25 ENCOUNTER — Ambulatory Visit (INDEPENDENT_AMBULATORY_CARE_PROVIDER_SITE_OTHER): Payer: Medicare Other | Admitting: Urology

## 2021-06-25 VITALS — BP 86/60 | HR 69 | Temp 98.2°F

## 2021-06-25 DIAGNOSIS — N3946 Mixed incontinence: Secondary | ICD-10-CM | POA: Diagnosis not present

## 2021-06-25 LAB — URINALYSIS, COMPLETE
Bilirubin, UA: NEGATIVE
Ketones, UA: NEGATIVE
Nitrite, UA: NEGATIVE
Protein,UA: NEGATIVE
Specific Gravity, UA: 1.01 (ref 1.005–1.030)
Urobilinogen, Ur: 0.2 mg/dL (ref 0.2–1.0)
pH, UA: 5 (ref 5.0–7.5)

## 2021-06-25 LAB — MICROSCOPIC EXAMINATION

## 2021-06-25 MED ORDER — CIPROFLOXACIN HCL 250 MG PO TABS: 250.0000 mg | ORAL_TABLET | Freq: Two times a day (BID) | ORAL | 0 refills | Status: AC

## 2021-06-25 MED ORDER — NITROFURANTOIN MACROCRYSTAL 100 MG PO CAPS: 100.0000 mg | ORAL_CAPSULE | Freq: Every day | ORAL | 11 refills | Status: DC

## 2021-06-25 NOTE — Patient Instructions (Signed)
Take Cipro 250 mg BID x 7 days. After taking the Cipro please start Macrodantin 100 mg daily. Follow up in two months, or sooner if symptoms do not improve. We will call you with urine culture results.

## 2021-06-25 NOTE — Progress Notes (Signed)
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  She leaks with coughing sneezing bending lifting.  She has urge incontinence.  No bedwetting.  She can soak 6 pads a day.  Urine runs down her leg when she stands.   She can void every 1-3 hours and goes hourly at night.  She has ankle edema   Flow was reasonable and she does not necessarily feel empty.  Sometimes she leans forward to try to empty better   She thinks she gets 10 infections a year.  Sometimes she gets confused.  Urine gets foul-smelling.   Positive cultures in medical record   Patient has mixed incontinence.  She has recurrent bladder infections.  I put her on prophylaxis today with trimethoprim 100 mg 3x11.    The last 24 hours patient states she is has a lot more frequency.  She thought she is infected again on the trimethoprim.   Patient underwent flexible cystoscopy utilizing sterile technique.  Urine was very cloudy with a lot of white flecks and sediment.     Reasonly well supported bladder neck and no stress incontinence.  Narrowing of introitus  Based on last positive culture sent in ciprofloxacin to 50 mg twice a day for 7 days.  I will switch prophylaxis to daily Macrodantin 100 mg 3x11.  Reassess for down-regulation of frequency and nocturia and mixed incontinence in 6 or 7-week  Today Last culture was positive. Patient never picked up the once a day Macrodantin.  She has had vaginal pain for a week.  I think she is infected based upon symptoms and urine today.  Urine sent for culture.  Send in ciprofloxacin 250 mg twice a day for 7 days.  Start daily Macrodantin.  Reassess in 12 weeks and reassess           PMH: Past Medical  History:  Diagnosis Date   Amnesia    Anxiety    Arthritis    Back pain    CHF (congestive heart failure) (HCC)    Chronic kidney disease    Acute on chronic 10/2020   Chronic pain syndrome    Cystocele    Depression    Diabetes mellitus without complication (HCC)    Type II   GERD (gastroesophageal reflux disease)    History of blood transfusion    with knee placements   Hyperlipidemia    Hypertension    IBS (irritable bowel syndrome)    Obesity    Osteoporosis    Osteoporosis    Panic attacks    Seizure (HCC)    Shortness of breath dyspnea    Sleep apnea    not using now   Thyroid nodule     Surgical History: Past Surgical History:  Procedure Laterality Date   ABDOMINAL HYSTERECTOMY     APPENDECTOMY     BACK SURGERY     CARDIAC CATHETERIZATION     CHOLECYSTECTOMY     CORONARY STENT INTERVENTION N/A 05/05/2018   Procedure: CORONARY STENT INTERVENTION;  Surgeon: Isaias Cowman, MD;  Location: [facility];  Service: Cardiovascular;  Laterality: N/A;   FOOT SURGERY     LEFT HEART CATH AND CORONARY ANGIOGRAPHY Left 05/05/2018   Procedure:  LEFT HEART CATH AND CORONARY ANGIOGRAPHY;  Surgeon: Isaias Cowman, MD;  Location: [facility];  Service: Cardiovascular;  Laterality: Left;   OOPHORECTOMY     REPLACEMENT TOTAL KNEE     TONSILLECTOMY      Home Medications:  Allergies as of 06/25/2021   No Known Allergies      Medication List        Accurate as of June 25, 2021  2:07 PM. If you have any questions, ask your nurse or doctor.          STOP taking these medications    ciprofloxacin 250 MG tablet Commonly known as: Cipro Stopped by: Reece Packer, MD       TAKE these medications    aspirin EC 81 MG tablet Take 81 mg by mouth daily. Swallow whole.   atorvastatin 40 MG tablet Commonly known as: LIPITOR Take 40 mg by mouth daily.   Belsomra 15 MG Tabs Generic drug: Suvorexant Take 15 mg by mouth daily.    busPIRone 30 MG tablet Commonly known as: BUSPAR Take 30 mg by mouth 2 (two) times daily.   Cholecalciferol 25 MCG (1000 UT) tablet Take 1,000 Units by mouth daily.   DULoxetine 60 MG capsule Commonly known as: CYMBALTA Take 60 mg by mouth in the morning.   ferrous sulfate 325 (65 FE) MG tablet Take 325 mg by mouth 2 (two) times daily with a meal.   furosemide 40 MG tablet Commonly known as: LASIX Take 40 mg by mouth.   gabapentin 600 MG tablet Commonly known as: NEURONTIN Take 1,200 mg by mouth 2 (two) times daily.   Lantus SoloStar 100 UNIT/ML Solostar Pen Generic drug: insulin glargine Inject 15 Units into the skin at bedtime. What changed: how much to take   levothyroxine 75 MCG tablet Commonly known as: SYNTHROID Take 1 tablet (75 mcg total) by mouth daily before breakfast.   lisinopril 30 MG tablet Commonly known as: ZESTRIL Take 30 mg by mouth daily.   metFORMIN 1000 MG tablet Commonly known as: GLUCOPHAGE Take 1,000 mg by mouth 2 (two) times daily with a meal.   methocarbamol 750 MG tablet Commonly known as: ROBAXIN Take 1 tablet (750 mg total) by mouth every 8 (eight) hours.   metoprolol succinate 50 MG 24 hr tablet Commonly known as: TOPROL-XL Take 50 mg by mouth daily. Take with or immediately following a meal.   nitrofurantoin 100 MG capsule Commonly known as: Macrodantin Take 1 capsule (100 mg total) by mouth daily at 6 (six) AM.   nystatin powder Commonly known as: MYCOSTATIN/NYSTOP Apply 1 application topically 2 (two) times daily.   Oscal 500/200 D-3 500-200 MG-UNIT tablet Generic drug: calcium-vitamin D Take 1 tablet by mouth 2 (two) times daily.   oxybutynin 5 MG tablet Commonly known as: DITROPAN Take 5 mg by mouth 3 (three) times daily.   oxyCODONE 5 MG immediate release tablet Commonly known as: Oxy IR/ROXICODONE Take 1 tablet (5 mg total) by mouth every 4 (four) hours as needed for moderate pain.   pantoprazole 40 MG  tablet Commonly known as: PROTONIX Take 40 mg by mouth daily as needed (heartburn).   QUEtiapine 200 MG tablet Commonly known as: SEROQUEL Take 200 mg by mouth at bedtime.   topiramate 200 MG tablet Commonly known as: TOPAMAX Take 200 mg by mouth at bedtime.   Viibryd 40 MG Tabs Generic drug: Vilazodone HCl Take 40 mg by mouth daily.  Allergies: No Known Allergies  Family History: Family History  Problem Relation Age of Onset   Stomach cancer Mother    Cancer Father    Cancer - Lung Father    Kidney cancer Sister    Thyroid disease Neg Hx     Social History:  reports that she has never smoked. She has never used smokeless tobacco. She reports that she does not drink alcohol and does not use drugs.  ROS:                                        Physical Exam: There were no vitals taken for this visit.  Constitutional:  Alert and oriented, No acute distress. HEENT: [place] AT, moist mucus membranes.  Trachea midline, no masses.  Laboratory Data: Lab Results  Component Value Date   WBC 6.4 05/11/2021   HGB 11.0 (L) 05/11/2021   HCT 35.9 (L) 05/11/2021   MCV 95.2 05/11/2021   PLT 159 05/11/2021    Lab Results  Component Value Date   CREATININE 1.06 (H) 04/01/2021    No results found for: PSA  No results found for: TESTOSTERONE  Lab Results  Component Value Date   HGBA1C 6.6 (H) 02/09/2021    Urinalysis    Component Value Date/Time   COLORURINE YELLOW (A) 02/15/2021 1057   APPEARANCEUR Hazy (A) 05/14/2021 1436   LABSPEC 1.010 02/15/2021 1057   LABSPEC 1.024 01/30/2014 0026   PHURINE 5.0 02/15/2021 1057   GLUCOSEU 2+ (A) 05/14/2021 1436   GLUCOSEU >=500 01/30/2014 0026   HGBUR SMALL (A) 02/15/2021 1057   BILIRUBINUR Negative 05/14/2021 1436   BILIRUBINUR Negative 01/30/2014 0026   KETONESUR NEGATIVE 02/15/2021 1057   PROTEINUR Negative 05/14/2021 1436   PROTEINUR NEGATIVE 02/15/2021 1057   UROBILINOGEN 1.0 07/11/2008  1245   NITRITE Negative 05/14/2021 1436   NITRITE NEGATIVE 02/15/2021 1057   LEUKOCYTESUR 1+ (A) 05/14/2021 1436   LEUKOCYTESUR NEGATIVE 02/15/2021 1057   LEUKOCYTESUR Trace 01/30/2014 0026    Pertinent Imaging:   Assessment & Plan: See above plan  There are no diagnoses linked to this encounter.  No follow-ups on file.  Reece Packer, MD  [facility] [address], [place], [place] [address] [phone]

## 2021-06-27 LAB — CULTURE, URINE COMPREHENSIVE

## 2021-07-06 ENCOUNTER — Ambulatory Visit (INDEPENDENT_AMBULATORY_CARE_PROVIDER_SITE_OTHER): Payer: Medicare Other | Admitting: Physician Assistant

## 2021-07-06 ENCOUNTER — Encounter: Payer: Self-pay | Admitting: Physician Assistant

## 2021-07-06 ENCOUNTER — Other Ambulatory Visit: Payer: Self-pay

## 2021-07-06 VITALS — BP 108/66 | HR 120 | Temp 98.0°F | Ht 63.0 in | Wt 238.0 lb

## 2021-07-06 DIAGNOSIS — N950 Postmenopausal bleeding: Secondary | ICD-10-CM

## 2021-07-06 DIAGNOSIS — R300 Dysuria: Secondary | ICD-10-CM | POA: Diagnosis not present

## 2021-07-06 NOTE — Progress Notes (Signed)
07/06/2021 5:30 PM   [name] [name] Jul 28, 1946 [id number]  CC: Chief Complaint  Patient presents with   Dysuria   HPI: [name] is a 75 y.o. female with PMH recurrent UTI and mixed incontinence on suppressive Macrobid who presents today for evaluation of possible recurrent versus persistent UTI.  She is accompanied today by her daughter, who contributes to HPI.  She was seen in clinic most recently by Dr. Matilde Sprang on 06/25/2021.  She is clinically infected at that time and started on empiric Cipro.  Urine culture grew mixed urogenital flora.  Today she reports an approximate 1 week history of vaginal pain and bleeding.  She is certain the bleeding is not coming from her bladder or mixed in with her urine.  Instead, she has noticed spots of blood in her underwear.  She denies prior episodes of vaginal bleeding and does not have a gynecologist.  She has been using vaginocele cream, but states it burns with application.  In-office UA today positive for 2+ glucose and trace intact blood; urine microscopy with 11-30 WBCs/HPF and many bacteria.  PMH: Past Medical History:  Diagnosis Date   Amnesia    Anxiety    Arthritis    Back pain    CHF (congestive heart failure) (HCC)    Chronic kidney disease    Acute on chronic 10/2020   Chronic pain syndrome    Cystocele    Depression    Diabetes mellitus without complication (HCC)    Type II   GERD (gastroesophageal reflux disease)    History of blood transfusion    with knee placements   Hyperlipidemia    Hypertension    IBS (irritable bowel syndrome)    Obesity    Osteoporosis    Osteoporosis    Panic attacks    Seizure (HCC)    Shortness of breath dyspnea    Sleep apnea    not using now   Thyroid nodule     Surgical History: Past Surgical History:  Procedure Laterality Date   ABDOMINAL HYSTERECTOMY     APPENDECTOMY     BACK SURGERY     CARDIAC CATHETERIZATION     CHOLECYSTECTOMY     CORONARY STENT  INTERVENTION N/A 05/05/2018   Procedure: CORONARY STENT INTERVENTION;  Surgeon: Isaias Cowman, MD;  Location: [facility];  Service: Cardiovascular;  Laterality: N/A;   FOOT SURGERY     LEFT HEART CATH AND CORONARY ANGIOGRAPHY Left 05/05/2018   Procedure: LEFT HEART CATH AND CORONARY ANGIOGRAPHY;  Surgeon: Isaias Cowman, MD;  Location: [facility];  Service: Cardiovascular;  Laterality: Left;   OOPHORECTOMY     REPLACEMENT TOTAL KNEE     TONSILLECTOMY      Home Medications:  Allergies as of 07/06/2021   No Known Allergies      Medication List        Accurate as of July 06, 2021  5:30 PM. If you have any questions, ask your nurse or doctor.          aspirin EC 81 MG tablet Take 81 mg by mouth daily. Swallow whole.   atorvastatin 40 MG tablet Commonly known as: LIPITOR Take 40 mg by mouth daily.   Belsomra 15 MG Tabs Generic drug: Suvorexant Take 15 mg by mouth daily.   busPIRone 30 MG tablet Commonly known as: BUSPAR Take 30 mg by mouth 2 (two) times daily.   Cholecalciferol 25 MCG (1000 UT) tablet Take 1,000 Units by  mouth daily.   DULoxetine 60 MG capsule Commonly known as: CYMBALTA Take 60 mg by mouth in the morning.   ferrous sulfate 325 (65 FE) MG tablet Take 325 mg by mouth 2 (two) times daily with a meal.   furosemide 40 MG tablet Commonly known as: LASIX Take 40 mg by mouth.   gabapentin 600 MG tablet Commonly known as: NEURONTIN Take 1,200 mg by mouth 2 (two) times daily.   Lantus SoloStar 100 UNIT/ML Solostar Pen Generic drug: insulin glargine Inject 15 Units into the skin at bedtime. What changed: how much to take   levothyroxine 75 MCG tablet Commonly known as: SYNTHROID Take 1 tablet (75 mcg total) by mouth daily before breakfast.   lisinopril 30 MG tablet Commonly known as: ZESTRIL Take 30 mg by mouth daily.   metFORMIN 1000 MG tablet Commonly known as: GLUCOPHAGE Take 1,000 mg by mouth 2 (two)  times daily with a meal.   methocarbamol 750 MG tablet Commonly known as: ROBAXIN Take 1 tablet (750 mg total) by mouth every 8 (eight) hours.   metoprolol succinate 50 MG 24 hr tablet Commonly known as: TOPROL-XL Take 50 mg by mouth daily. Take with or immediately following a meal.   nitrofurantoin 100 MG capsule Commonly known as: Macrodantin Take 1 capsule (100 mg total) by mouth daily at 6 (six) AM.   nitrofurantoin 100 MG capsule Commonly known as: Macrodantin Take 1 capsule (100 mg total) by mouth daily.   nystatin powder Commonly known as: MYCOSTATIN/NYSTOP Apply 1 application topically 2 (two) times daily.   Oscal 500/200 D-3 500-200 MG-UNIT tablet Generic drug: calcium-vitamin D Take 1 tablet by mouth 2 (two) times daily.   oxybutynin 5 MG tablet Commonly known as: DITROPAN Take 5 mg by mouth 3 (three) times daily.   oxyCODONE 5 MG immediate release tablet Commonly known as: Oxy IR/ROXICODONE Take 1 tablet (5 mg total) by mouth every 4 (four) hours as needed for moderate pain.   pantoprazole 40 MG tablet Commonly known as: PROTONIX Take 40 mg by mouth daily as needed (heartburn).   QUEtiapine 200 MG tablet Commonly known as: SEROQUEL Take 200 mg by mouth at bedtime.   topiramate 200 MG tablet Commonly known as: TOPAMAX Take 200 mg by mouth at bedtime.   Viibryd 40 MG Tabs Generic drug: Vilazodone HCl Take 40 mg by mouth daily.        Allergies:  No Known Allergies  Family History: Family History  Problem Relation Age of Onset   Stomach cancer Mother    Cancer Father    Cancer - Lung Father    Kidney cancer Sister    Thyroid disease Neg Hx     Social History:   reports that she has never smoked. She has never used smokeless tobacco. She reports that she does not drink alcohol and does not use drugs.  Physical Exam: BP 108/66   Pulse (!) 120   Temp 98 F (36.7 C)   Ht '5\' 3"'$  (1.6 m)   Wt 238 lb (108 kg)   BMI 42.16 kg/m    Constitutional:  Alert and oriented, no acute distress, nontoxic appearing HEENT: [place], AT Cardiovascular: No clubbing, cyanosis, or edema Respiratory: Normal respiratory effort, no increased work of breathing Skin: No rashes, bruises or suspicious lesions Neurologic: Grossly intact, no focal deficits, moving all 4 extremities Psychiatric: Normal mood and affect  Laboratory Data: Results for orders placed or performed in visit on 07/06/21  Mycoplasma / ureaplasma culture  Specimen: Genital   UR  Result Value Ref Range   Ureaplasma urealyticum Comment Negative   Mycoplasma hominis Culture Comment Negative  Microscopic Examination   Urine  Result Value Ref Range   WBC, UA 11-30 (A) 0 - 5 /hpf   RBC 0-2 0 - 2 /hpf   Epithelial Cells (non renal) 0-10 0 - 10 /hpf   Bacteria, UA Many (A) None seen/Few  Urinalysis, Complete  Result Value Ref Range   Specific Gravity, UA 1.015 1.005 - 1.030   pH, UA 5.5 5.0 - 7.5   Color, UA Yellow Yellow   Appearance Ur Cloudy (A) Clear   Leukocytes,UA Negative Negative   Protein,UA Negative Negative/Trace   Glucose, UA 2+ (A) Negative   Ketones, UA Negative Negative   RBC, UA Trace (A) Negative   Bilirubin, UA Negative Negative   Urobilinogen, Ur 0.2 0.2 - 1.0 mg/dL   Nitrite, UA Negative Negative   Microscopic Examination See below:    Assessment & Plan:   1. Dysuria UA today is stable despite previously negative urine culture.  We will repeat urine culture today, send for atypical culture, and send for urine cytology today for further evaluation and treat as indicated.  Otherwise, counseled patient to continue daily suppressive Macrobid and follow-up with Dr. Matilde Sprang as scheduled.  Additionally, counseled her to try over-the-counter vaginal moisturizers to help with her vulvar burning. - Urinalysis, Complete - CULTURE, URINE COMPREHENSIVE - Cytology - Non PAP; - Mycoplasma / ureaplasma culture  2. Postmenopausal vaginal bleeding X1  week.  Referring her to OB/GYN for further evaluation. - Ambulatory referral to Obstetrics / Gynecology  Return if symptoms worsen or fail to improve.  Debroah Loop, PA-C  [facility] [address] [place], [place] [address] [phone]

## 2021-07-06 NOTE — Patient Instructions (Addendum)
Please continue the oxybutynin and Macrobid that Dr. Matilde Sprang prescribed. I'll call you when I get the results of your urine testing.  In the meantime, please try the over-the-counter vaginal moisturizer called Replens.

## 2021-07-10 LAB — MICROSCOPIC EXAMINATION

## 2021-07-10 LAB — URINALYSIS, COMPLETE
Bilirubin, UA: NEGATIVE
Ketones, UA: NEGATIVE
Leukocytes,UA: NEGATIVE
Nitrite, UA: NEGATIVE
Protein,UA: NEGATIVE
Specific Gravity, UA: 1.015 (ref 1.005–1.030)
Urobilinogen, Ur: 0.2 mg/dL (ref 0.2–1.0)
pH, UA: 5.5 (ref 5.0–7.5)

## 2021-07-11 LAB — CYTOLOGY - NON PAP

## 2021-07-14 LAB — CULTURE, URINE COMPREHENSIVE

## 2021-07-16 ENCOUNTER — Telehealth: Payer: Self-pay | Admitting: *Deleted

## 2021-07-16 LAB — MYCOPLASMA / UREAPLASMA CULTURE
Mycoplasma hominis Culture: NEGATIVE
Ureaplasma urealyticum: NEGATIVE

## 2021-07-16 NOTE — Telephone Encounter (Signed)
Notified patient as instructed, patient pleased. Discussed follow-up appointments, patient agrees  

## 2021-07-16 NOTE — Telephone Encounter (Signed)
-----   Message from Debroah Loop, [place] sent at 07/16/2021  1:47 PM EDT ----- Urine culture and cytology both negative for infection. We're still waiting on the results of her atypical culture and will reach out when we have these. Please continue with plans for OBGYN follow up. ----- Message ----- From: Interface, [facility] Lab Results In Sent: 07/09/2021  11:35 AM EDT To: Debroah Loop, PA-C

## 2021-07-17 ENCOUNTER — Telehealth: Payer: Self-pay

## 2021-07-17 ENCOUNTER — Encounter: Payer: Self-pay | Admitting: Obstetrics and Gynecology

## 2021-07-17 NOTE — Telephone Encounter (Signed)
-----   Message from Debroah Loop, [place] sent at 07/17/2021  9:54 AM EDT ----- Atypical culture also negative. ----- Message ----- From: Interface, [facility] Lab Results In Sent: 07/09/2021  11:35 AM EDT To: Debroah Loop, PA-C

## 2021-07-17 NOTE — Telephone Encounter (Signed)
Notified patient as advised, patient voiced understanding.

## 2021-07-19 ENCOUNTER — Inpatient Hospital Stay: Payer: Medicare Other | Attending: Oncology

## 2021-07-19 DIAGNOSIS — R0602 Shortness of breath: Secondary | ICD-10-CM | POA: Insufficient documentation

## 2021-07-19 DIAGNOSIS — Z8051 Family history of malignant neoplasm of kidney: Secondary | ICD-10-CM | POA: Insufficient documentation

## 2021-07-19 DIAGNOSIS — Z79899 Other long term (current) drug therapy: Secondary | ICD-10-CM | POA: Insufficient documentation

## 2021-07-19 DIAGNOSIS — Z801 Family history of malignant neoplasm of trachea, bronchus and lung: Secondary | ICD-10-CM | POA: Insufficient documentation

## 2021-07-19 DIAGNOSIS — M549 Dorsalgia, unspecified: Secondary | ICD-10-CM | POA: Insufficient documentation

## 2021-07-19 DIAGNOSIS — Z800 Family history of malignant neoplasm of digestive organs: Secondary | ICD-10-CM | POA: Insufficient documentation

## 2021-07-19 DIAGNOSIS — Z809 Family history of malignant neoplasm, unspecified: Secondary | ICD-10-CM | POA: Insufficient documentation

## 2021-07-19 DIAGNOSIS — R5383 Other fatigue: Secondary | ICD-10-CM | POA: Insufficient documentation

## 2021-07-19 DIAGNOSIS — E538 Deficiency of other specified B group vitamins: Secondary | ICD-10-CM | POA: Insufficient documentation

## 2021-07-19 DIAGNOSIS — D649 Anemia, unspecified: Secondary | ICD-10-CM | POA: Insufficient documentation

## 2021-07-20 ENCOUNTER — Encounter (HOSPITAL_COMMUNITY): Payer: Self-pay

## 2021-07-20 ENCOUNTER — Other Ambulatory Visit: Payer: Self-pay

## 2021-07-20 ENCOUNTER — Emergency Department (HOSPITAL_COMMUNITY)
Admission: EM | Admit: 2021-07-20 | Discharge: 2021-07-20 | Disposition: A | Discharge status: Home / Self Care | Payer: Medicare Other | Attending: Emergency Medicine | Admitting: Emergency Medicine

## 2021-07-20 ENCOUNTER — Emergency Department (HOSPITAL_COMMUNITY): Payer: Medicare Other

## 2021-07-20 DIAGNOSIS — M542 Cervicalgia: Secondary | ICD-10-CM

## 2021-07-20 DIAGNOSIS — W19XXXA Unspecified fall, initial encounter: Secondary | ICD-10-CM

## 2021-07-20 DIAGNOSIS — S0990XA Unspecified injury of head, initial encounter: Secondary | ICD-10-CM | POA: Diagnosis present

## 2021-07-20 DIAGNOSIS — E119 Type 2 diabetes mellitus without complications: Secondary | ICD-10-CM | POA: Insufficient documentation

## 2021-07-20 HISTORY — DX: Type 2 diabetes mellitus without complications: E11.9

## 2021-07-20 LAB — CBC WITH DIFFERENTIAL/PLATELET
Abs Immature Granulocytes: 0.13 10*3/uL — ABNORMAL HIGH (ref 0.00–0.07)
Basophils Absolute: 0.1 10*3/uL (ref 0.0–0.1)
Basophils Relative: 1 %
Eosinophils Absolute: 0.3 10*3/uL (ref 0.0–0.5)
Eosinophils Relative: 4 %
HCT: 37.1 % (ref 36.0–46.0)
Hemoglobin: 11.9 g/dL — ABNORMAL LOW (ref 12.0–15.0)
Immature Granulocytes: 2 %
Lymphocytes Relative: 18 %
Lymphs Abs: 1.6 10*3/uL (ref 0.7–4.0)
MCH: 30 pg (ref 26.0–34.0)
MCHC: 32.1 g/dL (ref 30.0–36.0)
MCV: 93.5 fL (ref 80.0–100.0)
Monocytes Absolute: 0.8 10*3/uL (ref 0.1–1.0)
Monocytes Relative: 9 %
Neutro Abs: 5.9 10*3/uL (ref 1.7–7.7)
Neutrophils Relative %: 66 %
Platelets: 120 10*3/uL — ABNORMAL LOW (ref 150–400)
RBC: 3.97 MIL/uL (ref 3.87–5.11)
RDW: 14.6 % (ref 11.5–15.5)
WBC: 8.8 10*3/uL (ref 4.0–10.5)
nRBC: 0 % (ref 0.0–0.2)

## 2021-07-20 LAB — BASIC METABOLIC PANEL
Anion gap: 10 (ref 5–15)
BUN: 13 mg/dL (ref 8–23)
CO2: 22 mmol/L (ref 22–32)
Calcium: 8.9 mg/dL (ref 8.9–10.3)
Chloride: 106 mmol/L (ref 98–111)
Creatinine, Ser: 1.1 mg/dL — ABNORMAL HIGH (ref 0.44–1.00)
GFR, Estimated: 53 mL/min — ABNORMAL LOW (ref >60)
Glucose, Bld: 306 mg/dL — ABNORMAL HIGH (ref 70–99)
Potassium: 4.6 mmol/L (ref 3.5–5.1)
Sodium: 138 mmol/L (ref 135–145)

## 2021-07-20 MED ORDER — IBUPROFEN 400 MG PO TABS
600.0000 mg | ORAL_TABLET | Freq: Once | ORAL | Status: AC
  Administered 2021-07-20: 600 mg via ORAL
  Filled 2021-07-20: qty 1

## 2021-07-20 MED ORDER — LIDOCAINE 5 % EX PTCH
1.0000 | MEDICATED_PATCH | CUTANEOUS | Status: DC
  Administered 2021-07-20: 1 via TRANSDERMAL
  Filled 2021-07-20: qty 1

## 2021-07-20 MED ORDER — ACETAMINOPHEN 325 MG PO TABS
650.0000 mg | ORAL_TABLET | Freq: Once | ORAL | Status: AC
  Administered 2021-07-20: 650 mg via ORAL
  Filled 2021-07-20: qty 2

## 2021-07-20 NOTE — Progress Notes (Signed)
[facility] responded to Level II trauma page; when pt. arrived EMS said pt.'s dtr. is en route.  At length, [place] checked in w/pt. and learned that she fell backwards and hit her head in a parking lot.  [place] spoke w/ED entrance staff to let them know dtr. [name] is expected to arrive and , per RN, can go to pt.'s rm.  Chaplains remain available as needed.  Lindaann Pascal, Chaplain Pager: [phone]

## 2021-07-20 NOTE — ED Provider Notes (Addendum)
[facility] EMERGENCY DEPARTMENT Provider Note   CSN: [id number] Arrival date & time: 07/20/21  1007     History Chief Complaint  Patient presents with   [name]    [name] is a 75 y.o. female.  Pt is a 75 yo female presenting for fall. Pt states she was walking on sidewalk when she fell backwards, head trauma, states she is on warfarin, admits to loc-seconds only, witnessed, able to ambulate afterwards, occurred directly prior to arrival. Admits to headache and neck pain.   The history is provided by the patient. No language interpreter was used.  Fall Associated symptoms include headaches. Pertinent negatives include no chest pain, no abdominal pain and no shortness of breath.      Past Medical History:  Diagnosis Date   Diabetes mellitus without complication ([place])     There are no problems to display for this patient.   Past Surgical History:  Procedure Laterality Date   BACK SURGERY       OB History   No obstetric history on file.     History reviewed. No pertinent family history.  Social History   Tobacco Use   Smoking status: Never   Smokeless tobacco: Never  Substance Use Topics   Alcohol use: Never   Drug use: Never    Home Medications Prior to Admission medications   Not on File    Allergies    Patient has no allergy information on record.  Review of Systems   Review of Systems  Constitutional:  Negative for chills and fever.  HENT:  Negative for ear pain and sore throat.   Eyes:  Negative for pain and visual disturbance.  Respiratory:  Negative for cough and shortness of breath.   Cardiovascular:  Negative for chest pain and palpitations.  Gastrointestinal:  Negative for abdominal pain and vomiting.  Genitourinary:  Negative for dysuria and hematuria.  Musculoskeletal:  Positive for neck pain. Negative for arthralgias and back pain.  Skin:  Negative for color change and rash.  Neurological:  Positive for  headaches. Negative for seizures and syncope.  All other systems reviewed and are negative.  Physical Exam Updated Vital Signs BP (!) 146/85   Pulse 85   Temp 98.6 F (37 C) (Oral)   Resp 11   Ht '5\' 2"'$  (1.575 m)   Wt 103 kg   SpO2 96%   BMI 41.52 kg/m   Physical Exam Vitals and nursing note reviewed.  Constitutional:      General: She is not in acute distress.    Appearance: She is well-developed.  HENT:     Head: Normocephalic and atraumatic.  Eyes:     Conjunctiva/sclera: Conjunctivae normal.  Cardiovascular:     Rate and Rhythm: Normal rate and regular rhythm.     Heart sounds: No murmur heard. Pulmonary:     Effort: Pulmonary effort is normal. No respiratory distress.     Breath sounds: Normal breath sounds.  Abdominal:     Palpations: Abdomen is soft.     Tenderness: There is no abdominal tenderness.  Musculoskeletal:     Cervical back: Neck supple. Bony tenderness present. No tenderness.     Thoracic back: No tenderness or bony tenderness.     Lumbar back: No tenderness or bony tenderness.  Skin:    General: Skin is warm and dry.  Neurological:     Mental Status: She is alert.     GCS: GCS eye subscore is 4. GCS verbal  subscore is 5. GCS motor subscore is 6.     Cranial Nerves: Cranial nerves are intact.     Sensory: Sensation is intact.     Motor: Motor function is intact.     Coordination: Coordination is intact.     Gait: Gait is intact.    ED Results / Procedures / Treatments   Labs (all labs ordered are listed, but only abnormal results are displayed) Labs Reviewed  CBC WITH DIFFERENTIAL/PLATELET - Abnormal; Notable for the following components:      Result Value   Hemoglobin 11.9 (*)    Platelets 120 (*)    Abs Immature Granulocytes 0.13 (*)    All other components within normal limits  BASIC METABOLIC PANEL - Abnormal; Notable for the following components:   Glucose, Bld 306 (*)    Creatinine, Ser 1.10 (*)    GFR, Estimated 53 (*)    All  other components within normal limits    EKG None  Radiology CT HEAD WO CONTRAST (5MM)  Result Date: 07/20/2021 CLINICAL DATA:  Trauma EXAM: CT HEAD WITHOUT CONTRAST TECHNIQUE: Contiguous axial images were obtained from the base of the skull through the vertex without intravenous contrast. COMPARISON:  Brain MRI 02/16/2021, CT head 03/29/2021 FINDINGS: Brain: There is no acute intracranial hemorrhage, extra-axial fluid collection, or infarct. The ventricles are stable in size. There is a minimal burden of chronic white matter microangiopathy. There is a partially calcified lesion in the left aspect of the canal at the level of the C1 vertebral body consistent with a meningioma as seen on prior MR cervical spine. There is no new mass lesion. There is no midline shift. Vascular: There is calcification of the bilateral cavernous ICAs. Skull: Normal. Negative for fracture or focal lesion. Sinuses/Orbits: The paranasal sinuses are clear. Bilateral lens implants are in place. The globes and orbits are otherwise unremarkable. Other: A small calcified nodule in the right parietal scalp is unchanged. IMPRESSION: No acute intracranial hemorrhage or calvarial fracture. Electronically Signed   By: Valetta Mole M.D.   On: 07/20/2021 11:07   CT Cervical Spine Wo Contrast  Result Date: 07/20/2021 CLINICAL DATA:  Fall EXAM: CT CERVICAL SPINE WITHOUT CONTRAST TECHNIQUE: Multidetector CT imaging of the cervical spine was performed without intravenous contrast. Multiplanar CT image reconstructions were also generated. COMPARISON:  CT cervical spine 03/29/2021 FINDINGS: Alignment: Normal. Skull base and vertebrae: Skull base alignment is maintained. Mild loss of vertebral body height at T2 is unchanged. The remaining body heights are preserved, without evidence of acute fracture. Soft tissues and spinal canal: No prevertebral fluid or swelling. No visible canal hematoma. The meningioma in the canal at the level of the C1  vertebral body is unchanged. Disc levels: The intervertebral disc spaces are preserved. There is mild uncovertebral and facet arthropathy throughout the cervical spine, with the facet arthropathy overall worse on the left. The osseous spinal canal is patent. Upper chest: Opacities in the posterior right lower lobe likely reflect dependent subsegmental atelectasis. A calcified left hilar lymph node is noted. Other: None. IMPRESSION: No acute fracture or traumatic malalignment of the cervical spine. Electronically Signed   By: Valetta Mole M.D.   On: 07/20/2021 11:13    Procedures Procedures   Medications Ordered in ED Medications  acetaminophen (TYLENOL) tablet 650 mg (650 mg Oral Given 07/20/21 1033)    ED Course  I have reviewed the triage vital signs and the nursing notes.  Pertinent labs & imaging results that were available during  my care of the patient were reviewed by me and considered in my medical decision making (see chart for details).    MDM Rules/Calculators/A&P                          11:47 AM 75 yo female presenting for mechanical fall on blood thinners with blunt head trauma and neck pain. Pt is alert and oriented x 3, no acute distress, with stable vitals. No neurovascular deficits on exam. Cervical midline tenderness present. CT head and neck demonstrates no acute process. C-collar removed. Patient able to ambulate without difficulty.   Patient in no distress and overall condition improved here in the ED. Detailed discussions were had with the patient regarding current findings, and need for close f/u with PCP or on call doctor. The patient has been instructed to return immediately if the symptoms worsen in any way for re-evaluation. Patient verbalized understanding and is in agreement with current care plan. All questions answered prior to discharge.         Final Clinical Impression(s) / ED Diagnoses Final diagnoses:  Fall, initial encounter  Neck pain    Rx /  DC Orders ED Discharge Orders     None        Lianne Cure, DO [id number]    Campbell Stall P, DO [id number]

## 2021-07-20 NOTE — ED Notes (Signed)
Trauma Response Nurse Note-  Reason for Call / Reason for Trauma activation: Level 2 -- fall on thinners-- [place] with a possible LOC-- c/o headache. Has C-Collar on per EMS. IV 20g in left FA   -  Initial Focused Assessment (If applicable, or please see trauma documentation): A/O x 4, w/d, mae X4  with no pain. Does c/o pain in back of head and uuncomfortable with C-collar.   Interventions: Labs CT scan  Plan of Care as of this note:  This TRN transported pt to CT scan when room was available   Rolene Arbour, RN BSN Trauma Response Nurse [phone]

## 2021-07-20 NOTE — ED Triage Notes (Signed)
Pt bib [facility] [place] with complains of a fall after stepping off the curb. Pt daughter was on scene and does endorse pt passing out for "a minute" . Pt does state she takes a blood thinner but does not know which one.Pt has not taken any of her morning medications yet today. [facility] vss. CBG 332

## 2021-07-23 ENCOUNTER — Encounter: Payer: Self-pay | Admitting: Physician Assistant

## 2021-07-23 ENCOUNTER — Encounter: Payer: Self-pay | Admitting: Oncology

## 2021-07-25 ENCOUNTER — Inpatient Hospital Stay: Payer: Medicare Other

## 2021-07-25 DIAGNOSIS — Z8051 Family history of malignant neoplasm of kidney: Secondary | ICD-10-CM | POA: Diagnosis not present

## 2021-07-25 DIAGNOSIS — Z79899 Other long term (current) drug therapy: Secondary | ICD-10-CM | POA: Diagnosis not present

## 2021-07-25 DIAGNOSIS — D649 Anemia, unspecified: Secondary | ICD-10-CM | POA: Diagnosis present

## 2021-07-25 DIAGNOSIS — R0602 Shortness of breath: Secondary | ICD-10-CM | POA: Diagnosis not present

## 2021-07-25 DIAGNOSIS — Z801 Family history of malignant neoplasm of trachea, bronchus and lung: Secondary | ICD-10-CM | POA: Diagnosis not present

## 2021-07-25 DIAGNOSIS — R5383 Other fatigue: Secondary | ICD-10-CM | POA: Diagnosis not present

## 2021-07-25 DIAGNOSIS — Z800 Family history of malignant neoplasm of digestive organs: Secondary | ICD-10-CM | POA: Diagnosis not present

## 2021-07-25 DIAGNOSIS — Z809 Family history of malignant neoplasm, unspecified: Secondary | ICD-10-CM | POA: Diagnosis not present

## 2021-07-25 DIAGNOSIS — M549 Dorsalgia, unspecified: Secondary | ICD-10-CM | POA: Diagnosis not present

## 2021-07-25 DIAGNOSIS — E538 Deficiency of other specified B group vitamins: Secondary | ICD-10-CM

## 2021-07-25 MED ORDER — CYANOCOBALAMIN 1000 MCG/ML IJ SOLN
1000.0000 ug | Freq: Once | INTRAMUSCULAR | Status: AC
Start: 2021-07-25 — End: 2021-07-25
  Administered 2021-07-25: 1000 ug via INTRAMUSCULAR
  Filled 2021-07-25: qty 1

## 2021-08-14 ENCOUNTER — Other Ambulatory Visit: Payer: Self-pay

## 2021-08-14 ENCOUNTER — Ambulatory Visit (INDEPENDENT_AMBULATORY_CARE_PROVIDER_SITE_OTHER): Payer: Medicare Other | Admitting: Physician Assistant

## 2021-08-14 ENCOUNTER — Encounter: Payer: Self-pay | Admitting: Physician Assistant

## 2021-08-14 ENCOUNTER — Telehealth: Payer: Self-pay | Admitting: *Deleted

## 2021-08-14 VITALS — BP 113/75 | HR 102 | Ht 64.0 in | Wt 235.0 lb

## 2021-08-14 DIAGNOSIS — R300 Dysuria: Secondary | ICD-10-CM

## 2021-08-14 DIAGNOSIS — N952 Postmenopausal atrophic vaginitis: Secondary | ICD-10-CM

## 2021-08-14 LAB — URINALYSIS, COMPLETE
Bilirubin, UA: NEGATIVE
Ketones, UA: NEGATIVE
Nitrite, UA: NEGATIVE
Specific Gravity, UA: 1.015 (ref 1.005–1.030)
Urobilinogen, Ur: 0.2 mg/dL (ref 0.2–1.0)
pH, UA: 5.5 (ref 5.0–7.5)

## 2021-08-14 LAB — MICROSCOPIC EXAMINATION: WBC, UA: >30 /hpf — AB (ref 0–5)

## 2021-08-14 MED ORDER — CIPROFLOXACIN HCL 250 MG PO TABS: 250.0000 mg | ORAL_TABLET | Freq: Two times a day (BID) | ORAL | 0 refills | Status: AC

## 2021-08-14 MED ORDER — ESTRADIOL 0.1 MG/GM VA CREA: TOPICAL_CREAM | VAGINAL | 12 refills | Status: DC

## 2021-08-14 NOTE — Telephone Encounter (Signed)
Patient to pick up antibiotic at [facility]. Patient to stop her suppressive medication while taking cipro.  Advised daughter

## 2021-08-14 NOTE — Progress Notes (Signed)
08/14/2021 11:10 AM   [name] [name] 06-Apr-1946 [id number]  CC: Chief Complaint  Patient presents with   Dysuria    HPI: [name] is a 75 y.o. 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  She is accompanied today by her daughter, who contributes to HPI.  Today she reports an approximate 1 month history of dysuria and vulvar burning.  She denies fever, chills, nausea, vomiting, and flank pain.  She states she occasionally sees spots of blood in her pull-ups.  I previously referred her to OB/GYN for evaluation of possible postmenopausal vaginal bleeding.  She is scheduled to see them in late November.  In-office catheterized UA today positive for trace glucose, 1+ blood, 2+ protein, and trace leukocyte esterase; urine microscopy with >30 WBCs/HPF and 11-30 RBCs/HPF.  PMH: Past Medical History:  Diagnosis Date   Amnesia    Anxiety    Arthritis    Back pain    CHF (congestive heart failure) (HCC)    Chronic kidney disease    Acute on chronic 10/2020   Chronic pain syndrome    Cystocele    Depression    Diabetes mellitus without complication (HCC)    Type II   Diabetes mellitus without complication (HCC)    GERD (gastroesophageal reflux disease)    History of blood transfusion    with knee placements   Hyperlipidemia    Hypertension    IBS (irritable bowel syndrome)    Obesity    Osteoporosis    Osteoporosis    Panic attacks    Seizure (HCC)    Shortness of breath dyspnea    Sleep apnea    not using now   Thyroid nodule     Surgical History: Past Surgical History:  Procedure Laterality Date   ABDOMINAL HYSTERECTOMY     APPENDECTOMY     BACK SURGERY     CARDIAC CATHETERIZATION     CHOLECYSTECTOMY     CORONARY STENT INTERVENTION N/A 05/05/2018   Procedure: CORONARY STENT INTERVENTION;  Surgeon: Isaias Cowman, MD;  Location: [facility];  Service:  Cardiovascular;  Laterality: N/A;   FOOT SURGERY     LEFT HEART CATH AND CORONARY ANGIOGRAPHY Left 05/05/2018   Procedure: LEFT HEART CATH AND CORONARY ANGIOGRAPHY;  Surgeon: Isaias Cowman, MD;  Location: [facility];  Service: Cardiovascular;  Laterality: Left;   OOPHORECTOMY     REPLACEMENT TOTAL KNEE     TONSILLECTOMY      Home Medications:  Allergies as of 08/14/2021   No Known Allergies      Medication List        Accurate as of August 14, 2021 11:10 AM. If you have any questions, ask your nurse or doctor.          aspirin EC 81 MG tablet Take 81 mg by mouth daily. Swallow whole.   atorvastatin 40 MG tablet Commonly known as: LIPITOR Take 40 mg by mouth daily.   Belsomra 15 MG Tabs Generic drug: Suvorexant Take 15 mg by mouth daily.   busPIRone 30 MG tablet Commonly known as: BUSPAR Take 30 mg by mouth 2 (two) times daily.   Cholecalciferol 25 MCG (1000 UT) tablet Take 1,000 Units by mouth daily.   DULoxetine 60 MG capsule Commonly known as: CYMBALTA Take 60 mg by mouth in the morning.   ferrous sulfate 325 (65 FE) MG tablet Take 325 mg by  mouth 2 (two) times daily with a meal.   furosemide 40 MG tablet Commonly known as: LASIX Take 40 mg by mouth.   gabapentin 600 MG tablet Commonly known as: NEURONTIN Take 1,200 mg by mouth 2 (two) times daily.   Lantus SoloStar 100 UNIT/ML Solostar Pen Generic drug: insulin glargine Inject 15 Units into the skin at bedtime. What changed: how much to take   levothyroxine 75 MCG tablet Commonly known as: SYNTHROID Take 1 tablet (75 mcg total) by mouth daily before breakfast.   lisinopril 30 MG tablet Commonly known as: ZESTRIL Take 30 mg by mouth daily.   metFORMIN 1000 MG tablet Commonly known as: GLUCOPHAGE Take 1,000 mg by mouth 2 (two) times daily with a meal.   methocarbamol 750 MG tablet Commonly known as: ROBAXIN Take 1 tablet (750 mg total) by mouth every 8 (eight) hours.    metoprolol succinate 50 MG 24 hr tablet Commonly known as: TOPROL-XL Take 50 mg by mouth daily. Take with or immediately following a meal.   nitrofurantoin 100 MG capsule Commonly known as: Macrodantin Take 1 capsule (100 mg total) by mouth daily at 6 (six) AM.   nitrofurantoin 100 MG capsule Commonly known as: Macrodantin Take 1 capsule (100 mg total) by mouth daily.   nystatin powder Commonly known as: MYCOSTATIN/NYSTOP Apply 1 application topically 2 (two) times daily.   Oscal 500/200 D-3 500-200 MG-UNIT tablet Generic drug: calcium-vitamin D Take 1 tablet by mouth 2 (two) times daily.   oxybutynin 5 MG tablet Commonly known as: DITROPAN Take 5 mg by mouth 3 (three) times daily.   oxyCODONE 5 MG immediate release tablet Commonly known as: Oxy IR/ROXICODONE Take 1 tablet (5 mg total) by mouth every 4 (four) hours as needed for moderate pain.   pantoprazole 40 MG tablet Commonly known as: PROTONIX Take 40 mg by mouth daily as needed (heartburn).   QUEtiapine 200 MG tablet Commonly known as: SEROQUEL Take 200 mg by mouth at bedtime.   tolterodine 2 MG 24 hr capsule Commonly known as: DETROL LA Take 2 mg by mouth daily.   topiramate 200 MG tablet Commonly known as: TOPAMAX Take 200 mg by mouth at bedtime.   Viibryd 40 MG Tabs Generic drug: Vilazodone HCl Take 40 mg by mouth daily.        Allergies:  No Known Allergies  Family History: Family History  Problem Relation Age of Onset   Stomach cancer Mother    Cancer Father    Cancer - Lung Father    Kidney cancer Sister    Thyroid disease Neg Hx     Social History:   reports that she has never smoked. She has never used smokeless tobacco. She reports that she does not drink alcohol and does not use drugs.  Physical Exam: BP 113/75   Pulse (!) 102   Ht 5\' 4"  (1.626 m)   Wt 235 lb (106.6 kg)   BMI 40.34 kg/m   Constitutional:  Alert and oriented, no acute distress, nontoxic appearing HEENT: [place],  AT Cardiovascular: No clubbing, cyanosis, or edema Respiratory: Normal respiratory effort, no increased work of breathing GU: Sparse pubic hair, thin, shiny labia minora. Superior labial fusion. Skin: No rashes, bruises or suspicious lesions Neurologic: Grossly intact, no focal deficits, moving all 4 extremities Psychiatric: Normal mood and affect  Laboratory Data: Results for orders placed or performed in visit on 08/14/21  Microscopic Examination   Urine  Result Value Ref Range   WBC, UA >30 (  A) 0 - 5 /hpf   RBC 11-30 (A) 0 - 2 /hpf   Epithelial Cells (non renal) 0-10 0 - 10 /hpf   Bacteria, UA Few None seen/Few  Urinalysis, Complete  Result Value Ref Range   Specific Gravity, UA 1.015 1.005 - 1.030   pH, UA 5.5 5.0 - 7.5   Color, UA Yellow Yellow   Appearance Ur Cloudy (A) Clear   Leukocytes,UA Trace (A) Negative   Protein,UA 2+ (A) Negative/Trace   Glucose, UA Trace (A) Negative   Ketones, UA Negative Negative   RBC, UA 1+ (A) Negative   Bilirubin, UA Negative Negative   Urobilinogen, Ur 0.2 0.2 - 1.0 mg/dL   Nitrite, UA Negative Negative   Microscopic Examination See below:    Assessment & Plan:   1. Dysuria I think patient is experiencing a combination of dysuria and vulvar burning associated with #2 below.  Given microscopic hematuria and pyuria, will treat with empiric Cipro and send for culture for further evaluation.  Counseled patient to stop suppressive Macrobid while on Cipro and resume it upon completion.  She expressed understanding.  Will defer repeat UA to prove resolution of MH given recent cystoscopy with Dr. Matilde Sprang. - Urinalysis, Complete - CULTURE, URINE COMPREHENSIVE - ciprofloxacin (CIPRO) 250 MG tablet; Take 1 tablet (250 mg total) by mouth 2 (two) times daily for 5 days.  Dispense: 10 tablet; Refill: 0  2. Atrophic vaginitis Noted on physical exam today.  I think this is contributing significantly to her symptoms.  We will start her on estrogen  cream.  We discussed that estrogen cream plays a significant role in recurrent UTI prevention in postmenopausal women.  I counseled her extensively on consistent use and explained that it may take several months of consistent use before she sees a difference.  She expressed understanding. - estradiol (ESTRACE) 0.1 MG/GM vaginal cream; Apply one pea-sized amount around the opening of the urethra every day for 2 weeks, then 3 times weekly moving forward.  Dispense: 42.5 g; Refill: 12   Return if symptoms worsen or fail to improve.  Debroah Loop, PA-C  [facility] [address], [place], [place] [address] [phone]

## 2021-08-17 LAB — CULTURE, URINE COMPREHENSIVE

## 2021-08-21 ENCOUNTER — Telehealth: Payer: Self-pay | Admitting: *Deleted

## 2021-08-21 NOTE — Telephone Encounter (Signed)
Patient states she in not having any dysuria . No medication ordered

## 2021-08-21 NOTE — Telephone Encounter (Signed)
-----   Message from [place], [place] sent at 08/21/2021  4:03 PM EDT ----- If still having dysuria, ok to switch to Augmentin BID x5 days. Otherwise no further treatment indicated. ----- Message ----- From: Lavone Neri Lab Results In Sent: 08/14/2021   4:37 PM EDT To: Debroah Loop, PA-C

## 2021-08-23 ENCOUNTER — Inpatient Hospital Stay: Payer: Medicare Other | Attending: Oncology

## 2021-08-23 DIAGNOSIS — D649 Anemia, unspecified: Secondary | ICD-10-CM | POA: Insufficient documentation

## 2021-08-23 DIAGNOSIS — E538 Deficiency of other specified B group vitamins: Secondary | ICD-10-CM | POA: Insufficient documentation

## 2021-08-27 ENCOUNTER — Ambulatory Visit (INDEPENDENT_AMBULATORY_CARE_PROVIDER_SITE_OTHER): Payer: Medicare Other | Admitting: Urology

## 2021-08-27 ENCOUNTER — Encounter: Payer: Self-pay | Admitting: Urology

## 2021-08-27 ENCOUNTER — Inpatient Hospital Stay: Payer: Medicare Other

## 2021-08-27 ENCOUNTER — Other Ambulatory Visit: Payer: Self-pay

## 2021-08-27 VITALS — BP 129/69 | HR 118 | Ht 64.0 in

## 2021-08-27 DIAGNOSIS — R300 Dysuria: Secondary | ICD-10-CM | POA: Diagnosis not present

## 2021-08-27 DIAGNOSIS — N3946 Mixed incontinence: Secondary | ICD-10-CM

## 2021-08-27 DIAGNOSIS — E538 Deficiency of other specified B group vitamins: Secondary | ICD-10-CM | POA: Diagnosis not present

## 2021-08-27 DIAGNOSIS — D649 Anemia, unspecified: Secondary | ICD-10-CM | POA: Diagnosis present

## 2021-08-27 LAB — URINALYSIS, COMPLETE
Bilirubin, UA: NEGATIVE
Ketones, UA: NEGATIVE
Leukocytes,UA: NEGATIVE
Nitrite, UA: NEGATIVE
Protein,UA: NEGATIVE
RBC, UA: NEGATIVE
Specific Gravity, UA: 1.01 (ref 1.005–1.030)
Urobilinogen, Ur: 0.2 mg/dL (ref 0.2–1.0)
pH, UA: 6 (ref 5.0–7.5)

## 2021-08-27 LAB — MICROSCOPIC EXAMINATION: WBC, UA: >30 /hpf — AB (ref 0–5)

## 2021-08-27 MED ORDER — CYANOCOBALAMIN 1000 MCG/ML IJ SOLN
1000.0000 ug | Freq: Once | INTRAMUSCULAR | Status: AC
Start: 2021-08-27 — End: 2021-08-27
  Administered 2021-08-27: 1000 ug via INTRAMUSCULAR
  Filled 2021-08-27: qty 1

## 2021-08-27 MED ORDER — FLUCONAZOLE 100 MG PO TABS: 100.0000 mg | ORAL_TABLET | Freq: Every day | ORAL | 0 refills | Status: DC

## 2021-08-27 MED ORDER — MIRABEGRON ER 50 MG PO TB24: 50.0000 mg | ORAL_TABLET | Freq: Every day | ORAL | 11 refills | Status: DC

## 2021-08-27 NOTE — Progress Notes (Signed)
08/27/2021 2:11 PM   [name] [name] 1945-11-27 [id number]  Referring provider: [facility] [address] [place],  [place] [address]  Chief Complaint  Patient presents with   Follow-up   Dysuria    HPI: I was consulted to assess the patient recurrent urinary tract infections and mixed urinary incontinence.  She leaks with coughing sneezing bending lifting.  She has urge incontinence.  No bedwetting.  She can soak 6 pads a day.  Urine runs down her leg when she stands.   She can void every 1-3 hours and goes hourly at night.  She has ankle edema   Flow was reasonable and she does not necessarily feel empty.  Sometimes she leans forward to try to empty better   She thinks she gets 10 infections a year.  Sometimes she gets confused.  Urine gets foul-smelling.   Positive cultures in medical record   The last 24 hours patient states she is has a lot more frequency.  She thought she is infected again on the trimethoprim.   Patient underwent flexible cystoscopy utilizing sterile technique.  Urine was very cloudy with a lot of white flecks and sediment.     Reasonly well supported bladder neck and no stress incontinence.  Narrowing of introitus   Based on last positive culture sent in ciprofloxacin to 50 mg twice a day for 7 days.  I will switch prophylaxis to daily Macrodantin 100 mg 3x11.  Reassess for down-regulation of frequency and nocturia and mixed incontinence in 6 or 7-week   Last culture was positive. Patient never picked up the once a day Macrodantin.  She has had vaginal pain for a week.  I think she is infected based upon symptoms and urine today.  Urine sent for culture.  Send in ciprofloxacin 250 mg twice a day for 7 days.  Start daily Macrodantin.  Reassess in 12 weeks and reassess    Today Last culture negative.  She saw a nurse practitioner once again with vaginal pain and vaginal bleeding.  She used some over-the-counter cream and it caused burning.   Urine was negative for blood.  Referred to gynecology.  She has appointment in November to for gynecology.  She was catheterized and had red blood cells in the urine.  She was given ciprofloxacin and then told to go back on the daily Macrodantin.  She was given estrogen cream for vaginal dryness.  She had a very low colony count of Streptococcus.  She had a normal renal ultrasound in August 2021.  Will has mixed incontinence.  Functional component in addition.  Still on daily Macrodantin.  Still taking estrogen cream.  She is to see gynecology for vaginal burning in November   PMH: Past Medical History:  Diagnosis Date   Amnesia    Anxiety    Arthritis    Back pain    CHF (congestive heart failure) (HCC)    Chronic kidney disease    Acute on chronic 10/2020   Chronic pain syndrome    Cystocele    Depression    Diabetes mellitus without complication (HCC)    Type II   Diabetes mellitus without complication (HCC)    GERD (gastroesophageal reflux disease)    History of blood transfusion    with knee placements   Hyperlipidemia    Hypertension    IBS (irritable bowel syndrome)    Obesity    Osteoporosis    Osteoporosis    Panic attacks  Seizure ([place])    Shortness of breath dyspnea    Sleep apnea    not using now   Thyroid nodule     Surgical History: Past Surgical History:  Procedure Laterality Date   ABDOMINAL HYSTERECTOMY     APPENDECTOMY     BACK SURGERY     CARDIAC CATHETERIZATION     CHOLECYSTECTOMY     CORONARY STENT INTERVENTION N/A 05/05/2018   Procedure: CORONARY STENT INTERVENTION;  Surgeon: Isaias Cowman, MD;  Location: [facility];  Service: Cardiovascular;  Laterality: N/A;   FOOT SURGERY     LEFT HEART CATH AND CORONARY ANGIOGRAPHY Left 05/05/2018   Procedure: LEFT HEART CATH AND CORONARY ANGIOGRAPHY;  Surgeon: Isaias Cowman, MD;  Location: [facility];  Service: Cardiovascular;  Laterality: Left;   OOPHORECTOMY      REPLACEMENT TOTAL KNEE     TONSILLECTOMY      Home Medications:  Allergies as of 08/27/2021   No Known Allergies      Medication List        Accurate as of August 27, 2021  2:11 PM. If you have any questions, ask your nurse or doctor.          aspirin EC 81 MG tablet Take 81 mg by mouth daily. Swallow whole.   atorvastatin 40 MG tablet Commonly known as: LIPITOR Take 40 mg by mouth daily.   Belsomra 15 MG Tabs Generic drug: Suvorexant Take 15 mg by mouth daily.   busPIRone 30 MG tablet Commonly known as: BUSPAR Take 30 mg by mouth 2 (two) times daily.   Cholecalciferol 25 MCG (1000 UT) tablet Take 1,000 Units by mouth daily.   DULoxetine 60 MG capsule Commonly known as: CYMBALTA Take 60 mg by mouth in the morning.   estradiol 0.1 MG/GM vaginal cream Commonly known as: ESTRACE Apply one pea-sized amount around the opening of the urethra every day for 2 weeks, then 3 times weekly moving forward.   ferrous sulfate 325 (65 FE) MG tablet Take 325 mg by mouth 2 (two) times daily with a meal.   furosemide 40 MG tablet Commonly known as: LASIX Take 40 mg by mouth.   gabapentin 600 MG tablet Commonly known as: NEURONTIN Take 1,200 mg by mouth 2 (two) times daily.   Lantus SoloStar 100 UNIT/ML Solostar Pen Generic drug: insulin glargine Inject 15 Units into the skin at bedtime. What changed: how much to take   levothyroxine 75 MCG tablet Commonly known as: SYNTHROID Take 1 tablet (75 mcg total) by mouth daily before breakfast.   lisinopril 30 MG tablet Commonly known as: ZESTRIL Take 30 mg by mouth daily.   metFORMIN 1000 MG tablet Commonly known as: GLUCOPHAGE Take 1,000 mg by mouth 2 (two) times daily with a meal.   methocarbamol 750 MG tablet Commonly known as: ROBAXIN Take 1 tablet (750 mg total) by mouth every 8 (eight) hours.   metoprolol succinate 50 MG 24 hr tablet Commonly known as: TOPROL-XL Take 50 mg by mouth daily. Take with or  immediately following a meal.   nitrofurantoin 100 MG capsule Commonly known as: Macrodantin Take 1 capsule (100 mg total) by mouth daily at 6 (six) AM.   nitrofurantoin 100 MG capsule Commonly known as: Macrodantin Take 1 capsule (100 mg total) by mouth daily.   nystatin powder Commonly known as: MYCOSTATIN/NYSTOP Apply 1 application topically 2 (two) times daily.   Oscal 500/200 D-3 500-5 MG-MCG Tabs Generic drug: Calcium Carbonate-Vitamin D Take  1 tablet by mouth 2 (two) times daily.   oxybutynin 5 MG tablet Commonly known as: DITROPAN Take 5 mg by mouth 3 (three) times daily.   oxyCODONE 5 MG immediate release tablet Commonly known as: Oxy IR/ROXICODONE Take 1 tablet (5 mg total) by mouth every 4 (four) hours as needed for moderate pain.   pantoprazole 40 MG tablet Commonly known as: PROTONIX Take 40 mg by mouth daily as needed (heartburn).   QUEtiapine 200 MG tablet Commonly known as: SEROQUEL Take 200 mg by mouth at bedtime.   tolterodine 2 MG 24 hr capsule Commonly known as: DETROL LA Take 2 mg by mouth daily.   topiramate 200 MG tablet Commonly known as: TOPAMAX Take 200 mg by mouth at bedtime.   Viibryd 40 MG Tabs Generic drug: Vilazodone HCl Take 40 mg by mouth daily.        Allergies: No Known Allergies  Family History: Family History  Problem Relation Age of Onset   Stomach cancer Mother    Cancer Father    Cancer - Lung Father    Kidney cancer Sister    Thyroid disease Neg Hx     Social History:  reports that she has never smoked. She has never used smokeless tobacco. She reports that she does not drink alcohol and does not use drugs.  ROS:                                        Physical Exam: BP 129/69   Pulse (!) 118   Ht 5\' 4"  (1.626 m)   BMI 40.34 kg/m   Constitutional:  Alert and oriented, No acute distress. HEENT: [place] AT, moist mucus membranes.  Trachea midline, no masses.   Laboratory Data: Lab  Results  Component Value Date   WBC 8.8 07/20/2021   HGB 11.9 (L) 07/20/2021   HCT 37.1 07/20/2021   MCV 93.5 07/20/2021   PLT 120 (L) 07/20/2021    Lab Results  Component Value Date   CREATININE 1.10 (H) 07/20/2021    No results found for: PSA  No results found for: TESTOSTERONE  Lab Results  Component Value Date   HGBA1C 6.6 (H) 02/09/2021    Urinalysis    Component Value Date/Time   COLORURINE YELLOW (A) 02/15/2021 1057   APPEARANCEUR Cloudy (A) 08/14/2021 1020   LABSPEC 1.010 02/15/2021 1057   LABSPEC 1.024 01/30/2014 0026   PHURINE 5.0 02/15/2021 1057   GLUCOSEU Trace (A) 08/14/2021 1020   GLUCOSEU >=500 01/30/2014 0026   HGBUR SMALL (A) 02/15/2021 1057   BILIRUBINUR Negative 08/14/2021 1020   BILIRUBINUR Negative 01/30/2014 0026   KETONESUR NEGATIVE 02/15/2021 1057   PROTEINUR 2+ (A) 08/14/2021 1020   PROTEINUR NEGATIVE 02/15/2021 1057   UROBILINOGEN 1.0 07/11/2008 1245   NITRITE Negative 08/14/2021 1020   NITRITE NEGATIVE 02/15/2021 1057   LEUKOCYTESUR Trace (A) 08/14/2021 1020   LEUKOCYTESUR NEGATIVE 02/15/2021 1057   LEUKOCYTESUR Trace 01/30/2014 0026    Pertinent Imaging:   Assessment & Plan: Reassess in 6 weeks on Myrbetriq 50 mg samples and prescription.  Continue with estrogen cream.  Continue with Macrodantin.  Call if culture positive.  I will try to control her infections and her overactive bladder.  Not a surgical candidate  No red blood cells in urine.  Yeast in urine.  I gave Diflucan for 3 days 100 mg.  Hopefully this helps some of  the vaginal burning  There are no diagnoses linked to this encounter.  No follow-ups on file.  Reece Packer, MD  [facility] [address] [place], [place] [address] [phone]

## 2021-08-27 NOTE — Patient Instructions (Signed)
[facility] Address: [place], [place], [place] [address] Phone: [phone]  Dr. Philip Aspen

## 2021-08-29 ENCOUNTER — Telehealth: Payer: Self-pay | Admitting: Urology

## 2021-08-29 NOTE — Telephone Encounter (Signed)
Sw pharmacy . Needed clarification on diflucan. 1 tablet once a day for 3 days. Pharmacist will fill rx for pt .

## 2021-08-29 NOTE — Telephone Encounter (Signed)
Pt's caregiver called office asking about Diflucan (only 3 tabs) being sent to [facility] [address].  Can you confirm that [facility] [address] which [facility] nearest to their house doesn't have [place], and if not have them order it?  Please call [phone] to let them know how they can get this.

## 2021-08-30 ENCOUNTER — Encounter: Payer: Medicare Other | Admitting: Obstetrics and Gynecology

## 2021-08-30 LAB — CULTURE, URINE COMPREHENSIVE

## 2021-09-03 ENCOUNTER — Telehealth: Payer: Self-pay

## 2021-09-03 MED ORDER — CEPHALEXIN 500 MG PO CAPS: 500.0000 mg | ORAL_CAPSULE | Freq: Three times a day (TID) | ORAL | 0 refills | Status: AC

## 2021-09-03 NOTE — Telephone Encounter (Signed)
Pt daughter aware. Sent medication to pharmacy.

## 2021-09-03 NOTE — Telephone Encounter (Signed)
-----   Message from Bjorn Loser, MD sent at 09/02/2021  1:12 PM EDT ----- Kefex 500 mg tid for 7 days Then restart daily macrodantin    ----- Message ----- From: Alvera Novel, CMA Sent: 08/30/2021   8:31 AM EDT To: Bjorn Loser, MD   ----- Message ----- From: Interface, [facility] Lab Results In Sent: 08/27/2021   4:36 PM EDT To: [facility]

## 2021-09-17 ENCOUNTER — Ambulatory Visit: Payer: Medicare Other | Admitting: Urology

## 2021-09-18 ENCOUNTER — Encounter: Payer: Self-pay | Admitting: Urology

## 2021-09-19 ENCOUNTER — Inpatient Hospital Stay (HOSPITAL_BASED_OUTPATIENT_CLINIC_OR_DEPARTMENT_OTHER): Payer: Medicare Other | Admitting: Oncology

## 2021-09-19 ENCOUNTER — Encounter: Payer: Self-pay | Admitting: Oncology

## 2021-09-19 ENCOUNTER — Other Ambulatory Visit: Payer: Self-pay

## 2021-09-19 ENCOUNTER — Inpatient Hospital Stay: Payer: Medicare Other

## 2021-09-19 ENCOUNTER — Inpatient Hospital Stay: Payer: Medicare Other | Attending: Oncology

## 2021-09-19 VITALS — BP 129/90 | HR 107 | Temp 97.6°F | Wt 230.8 lb

## 2021-09-19 DIAGNOSIS — D510 Vitamin B12 deficiency anemia due to intrinsic factor deficiency: Secondary | ICD-10-CM

## 2021-09-19 DIAGNOSIS — Z801 Family history of malignant neoplasm of trachea, bronchus and lung: Secondary | ICD-10-CM | POA: Insufficient documentation

## 2021-09-19 DIAGNOSIS — M549 Dorsalgia, unspecified: Secondary | ICD-10-CM | POA: Insufficient documentation

## 2021-09-19 DIAGNOSIS — Z79899 Other long term (current) drug therapy: Secondary | ICD-10-CM | POA: Insufficient documentation

## 2021-09-19 DIAGNOSIS — Z9049 Acquired absence of other specified parts of digestive tract: Secondary | ICD-10-CM | POA: Diagnosis not present

## 2021-09-19 DIAGNOSIS — Z90721 Acquired absence of ovaries, unilateral: Secondary | ICD-10-CM | POA: Insufficient documentation

## 2021-09-19 DIAGNOSIS — K219 Gastro-esophageal reflux disease without esophagitis: Secondary | ICD-10-CM | POA: Insufficient documentation

## 2021-09-19 DIAGNOSIS — Z8051 Family history of malignant neoplasm of kidney: Secondary | ICD-10-CM | POA: Insufficient documentation

## 2021-09-19 DIAGNOSIS — R5383 Other fatigue: Secondary | ICD-10-CM | POA: Insufficient documentation

## 2021-09-19 DIAGNOSIS — Z800 Family history of malignant neoplasm of digestive organs: Secondary | ICD-10-CM | POA: Diagnosis not present

## 2021-09-19 DIAGNOSIS — R0602 Shortness of breath: Secondary | ICD-10-CM | POA: Diagnosis not present

## 2021-09-19 DIAGNOSIS — E538 Deficiency of other specified B group vitamins: Secondary | ICD-10-CM

## 2021-09-19 DIAGNOSIS — D649 Anemia, unspecified: Secondary | ICD-10-CM

## 2021-09-19 LAB — RETIC PANEL
Immature Retic Fract: 13.6 % (ref 2.3–15.9)
RBC.: 4.34 MIL/uL (ref 3.87–5.11)
Retic Count, Absolute: 99.4 10*3/uL (ref 19.0–186.0)
Retic Ct Pct: 2.3 % (ref 0.4–3.1)
Reticulocyte Hemoglobin: 34.7 pg (ref >27.9)

## 2021-09-19 LAB — CBC WITH DIFFERENTIAL/PLATELET
Abs Immature Granulocytes: 0.09 10*3/uL — ABNORMAL HIGH (ref 0.00–0.07)
Basophils Absolute: 0.1 10*3/uL (ref 0.0–0.1)
Basophils Relative: 1 %
Eosinophils Absolute: 0.2 10*3/uL (ref 0.0–0.5)
Eosinophils Relative: 3 %
HCT: 40.9 % (ref 36.0–46.0)
Hemoglobin: 13.2 g/dL (ref 12.0–15.0)
Immature Granulocytes: 1 %
Lymphocytes Relative: 26 %
Lymphs Abs: 2.1 10*3/uL (ref 0.7–4.0)
MCH: 29.6 pg (ref 26.0–34.0)
MCHC: 32.3 g/dL (ref 30.0–36.0)
MCV: 91.7 fL (ref 80.0–100.0)
Monocytes Absolute: 0.4 10*3/uL (ref 0.1–1.0)
Monocytes Relative: 6 %
Neutro Abs: 5 10*3/uL (ref 1.7–7.7)
Neutrophils Relative %: 63 %
Platelets: 145 10*3/uL — ABNORMAL LOW (ref 150–400)
RBC: 4.46 MIL/uL (ref 3.87–5.11)
RDW: 14.2 % (ref 11.5–15.5)
WBC: 8 10*3/uL (ref 4.0–10.5)
nRBC: 0 % (ref 0.0–0.2)

## 2021-09-19 LAB — IRON AND TIBC
Iron: 74 ug/dL (ref 28–170)
Saturation Ratios: 20 % (ref 10.4–31.8)
TIBC: 372 ug/dL (ref 250–450)
UIBC: 298 ug/dL

## 2021-09-19 LAB — VITAMIN B12: Vitamin B-12: 817 pg/mL (ref 180–914)

## 2021-09-19 LAB — FERRITIN: Ferritin: 33 ng/mL (ref 11–307)

## 2021-09-19 NOTE — Progress Notes (Signed)
Hematology/Oncology follow-up note [phone] [phone]   Patient Care Team: Pcp, [name] as PCP - [place]  REFERRING PROVIDER: [facility]  CHIEF COMPLAINTS/REASON FOR VISIT:  Anemia, B12 deficiency,  HISTORY OF PRESENTING ILLNESS:  [name] is a  75 y.o.  female with PMH listed below who was referred to me for evaluation of anemia Reviewed patient's recent labs that was done.  03/31/2021 Labs revealed anemia with hemoglobin of 8.7, MCV 95.9.   Reviewed patient's previous labs ordered by primary care physician's office, anemia is chronic onset , duration is since 2009 [name] aggravating or improving factors.  Associated signs and symptoms: Patient reports fatigue and SOB with exertion.  Denies weight loss, easy bruising, hematochezia, hemoptysis, hematuria.  5/262022 recent hospitalization due to T12 fracture, pain control.   INTERVAL HISTORY [name] is a 75 y.o. female who has above history reviewed by me today presents for follow up visit for anemia and B12 deficiency. Patient has been on monthly vitamin B12 injections.  She reports her fatigue level has improved  Review of Systems  Constitutional:  Positive for fatigue. Negative for appetite change, chills and fever.  HENT:   Negative for hearing loss and voice change.   Eyes:  Negative for eye problems.  Respiratory:  Positive for shortness of breath. Negative for chest tightness and cough.   Cardiovascular:  Negative for chest pain.  Gastrointestinal:  Negative for abdominal distention, abdominal pain and blood in stool.  Endocrine: Negative for hot flashes.  Genitourinary:  Negative for difficulty urinating and frequency.   Musculoskeletal:  Positive for back pain. Negative for arthralgias.  Skin:  Negative for itching and rash.  Neurological:  Negative for extremity weakness.  Hematological:  Negative for adenopathy.  Psychiatric/Behavioral:  Negative for  confusion.     MEDICAL HISTORY:  Past Medical History:  Diagnosis Date   Amnesia    Anxiety    Arthritis    Back pain    CHF (congestive heart failure) (HCC)    Chronic kidney disease    Acute on chronic 10/2020   Chronic pain syndrome    Cystocele    Depression    Diabetes mellitus without complication (HCC)    Type II   Diabetes mellitus without complication (HCC)    GERD (gastroesophageal reflux disease)    History of blood transfusion    with knee placements   Hyperlipidemia    Hypertension    IBS (irritable bowel syndrome)    Obesity    Osteoporosis    Osteoporosis    Panic attacks    Seizure (HCC)    Shortness of breath dyspnea    Sleep apnea    not using now   Thyroid nodule     SURGICAL HISTORY: Past Surgical History:  Procedure Laterality Date   ABDOMINAL HYSTERECTOMY     APPENDECTOMY     BACK SURGERY     CARDIAC CATHETERIZATION     CHOLECYSTECTOMY     CORONARY STENT INTERVENTION N/A 05/05/2018   Procedure: CORONARY STENT INTERVENTION;  Surgeon: Isaias Cowman, MD;  Location: [facility];  Service: Cardiovascular;  Laterality: N/A;   FOOT SURGERY     LEFT HEART CATH AND CORONARY ANGIOGRAPHY Left 05/05/2018   Procedure: LEFT HEART CATH AND CORONARY ANGIOGRAPHY;  Surgeon: Isaias Cowman, MD;  Location: [facility];  Service: Cardiovascular;  Laterality: Left;   OOPHORECTOMY     REPLACEMENT TOTAL KNEE     TONSILLECTOMY  SOCIAL HISTORY: Social History   Socioeconomic History   Marital status: Single    Spouse name: Not on file   Number of children: Not on file   Years of education: Not on file   Highest education level: Not on file  Occupational History   Not on file  Tobacco Use   Smoking status: Never   Smokeless tobacco: Never  Vaping Use   Vaping Use: Never used  Substance and Sexual Activity   Alcohol use: Never   Drug use: Never   Sexual activity: Not on file  Other Topics Concern   Not on file   Social History Narrative   ** Merged History Encounter **       Social Determinants of Health   Financial Resource Strain: Not on file  Food Insecurity: Not on file  Transportation Needs: Not on file  Physical Activity: Not on file  Stress: Not on file  Social Connections: Not on file  Intimate Partner Violence: Not on file    FAMILY HISTORY: Family History  Problem Relation Age of Onset   Stomach cancer Mother    Cancer Father    Cancer - Lung Father    Kidney cancer Sister    Thyroid disease Neg Hx     ALLERGIES:  has [name] Known Allergies.  MEDICATIONS:  Current Outpatient Medications  Medication Sig Dispense Refill   aspirin EC 81 MG tablet Take 81 mg by mouth daily. Swallow whole.     atorvastatin (LIPITOR) 40 MG tablet Take 40 mg by mouth daily.     BELSOMRA 15 MG TABS Take 15 mg by mouth daily.     busPIRone (BUSPAR) 30 MG tablet Take 30 mg by mouth 2 (two) times daily.     calcium-vitamin D (OSCAL 500/200 D-3) 500-200 MG-UNIT tablet Take 1 tablet by mouth 2 (two) times daily. 180 tablet 3   Cholecalciferol 25 MCG (1000 UT) tablet Take 1,000 Units by mouth daily.     DULoxetine (CYMBALTA) 60 MG capsule Take 60 mg by mouth in the morning.     estradiol (ESTRACE) 0.1 MG/GM vaginal cream Apply one pea-sized amount around the opening of the urethra every day for 2 weeks, then 3 times weekly moving forward. 42.5 g 12   ferrous sulfate 325 (65 FE) MG tablet Take 325 mg by mouth 2 (two) times daily with a meal.     fluconazole (DIFLUCAN) 100 MG tablet Take 1 tablet (100 mg total) by mouth daily. X 7 days 3 tablet 0   furosemide (LASIX) 40 MG tablet Take 40 mg by mouth.     gabapentin (NEURONTIN) 600 MG tablet Take 1,200 mg by mouth 2 (two) times daily.     LANTUS SOLOSTAR 100 UNIT/ML Solostar Pen Inject 15 Units into the skin at bedtime. (Patient taking differently: Inject 72 Units into the skin at bedtime.) 15 mL 11   levothyroxine (SYNTHROID) 75 MCG tablet Take 1 tablet  (75 mcg total) by mouth daily before breakfast. 30 tablet 1   lisinopril (ZESTRIL) 30 MG tablet Take 30 mg by mouth daily.     metFORMIN (GLUCOPHAGE) 1000 MG tablet Take 1,000 mg by mouth 2 (two) times daily with a meal.     methocarbamol (ROBAXIN) 750 MG tablet Take 1 tablet (750 mg total) by mouth every 8 (eight) hours. 90 tablet 1   metoprolol succinate (TOPROL-XL) 50 MG 24 hr tablet Take 50 mg by mouth daily. Take with or immediately following a meal.  mirabegron ER (MYRBETRIQ) 50 MG TB24 tablet Take 1 tablet (50 mg total) by mouth daily. 30 tablet 11   nitrofurantoin (MACRODANTIN) 100 MG capsule Take 1 capsule (100 mg total) by mouth daily. 30 capsule 11   nystatin (MYCOSTATIN/NYSTOP) powder Apply 1 application topically 2 (two) times daily.     oxyCODONE (OXY IR/ROXICODONE) 5 MG immediate release tablet Take 1 tablet (5 mg total) by mouth every 4 (four) hours as needed for moderate pain. 30 tablet 0   pantoprazole (PROTONIX) 40 MG tablet Take 40 mg by mouth daily as needed (heartburn).     QUEtiapine (SEROQUEL) 200 MG tablet Take 200 mg by mouth at bedtime.     topiramate (TOPAMAX) 200 MG tablet Take 200 mg by mouth at bedtime.     VIIBRYD 40 MG TABS Take 40 mg by mouth daily.     [name] current facility-administered medications for this visit.     PHYSICAL EXAMINATION: ECOG PERFORMANCE STATUS: 2 - Symptomatic, <50% confined to bed Vitals:   09/19/21 1343  BP: 129/90  Pulse: (!) 107  Temp: 97.6 F (36.4 C)   Filed Weights   09/19/21 1343  Weight: 230 lb 12.8 oz (104.7 kg)    Physical Exam Constitutional:      General: She is not in acute distress.    Comments: She sits in the wheelchair  HENT:     Head: Normocephalic and atraumatic.  Eyes:     General: [name] scleral icterus. Cardiovascular:     Rate and Rhythm: Normal rate and regular rhythm.     Heart sounds: Normal heart sounds.  Pulmonary:     Effort: Pulmonary effort is normal. [name] respiratory distress.     Breath  sounds: [name] wheezing.  Abdominal:     General: Bowel sounds are normal. There is [name] distension.     Palpations: Abdomen is soft.  Musculoskeletal:        General: [name] deformity. Normal range of motion.     Cervical back: Normal range of motion and neck supple.  Skin:    General: Skin is warm and dry.     Findings: [name] erythema or rash.  Neurological:     Mental Status: She is alert and oriented to person, place, and time. Mental status is at baseline.     Cranial Nerves: [name] cranial nerve deficit.     Coordination: Coordination normal.  Psychiatric:        Mood and Affect: Mood normal.     LABORATORY DATA:  I have reviewed the data as listed Lab Results  Component Value Date   WBC 8.0 09/19/2021   HGB 13.2 09/19/2021   HCT 40.9 09/19/2021   MCV 91.7 09/19/2021   PLT 145 (L) 09/19/2021   Recent Labs    02/16/21 0544 02/17/21 0523 03/29/21 0240 03/29/21 1848 03/31/21 1644 04/01/21 0343 07/20/21 1016  NA 141   < > 139   < > 135 139 138  K 4.8   < > 4.3   < > 5.1 4.6 4.6  CL 111   < > 111   < > 108 111 106  CO2 21*   < > 20*   < > 19* 22 22  GLUCOSE 131*   < > 226*   < > 251* 170* 306*  BUN 28*   < > 24*   < > 26* 23 13  CREATININE 1.19*   < > 1.28*   < > 1.40* 1.06* 1.10*  CALCIUM 8.9   < >  8.8*   < > 8.9 9.2 8.9  GFRNONAA 48*   < > 44*   < > 40* 55* 53*  PROT 6.4*  --  6.7  --  5.7*  --   --   ALBUMIN 3.2*  --  3.5  --  2.7*  --   --   AST 23  --  32  --  19  --   --   ALT 15  --  23  --  25  --   --   ALKPHOS 60  --  107  --  87  --   --   BILITOT 1.0  --  1.0  --  0.8  --   --    < > = values in this interval not displayed.    Iron/TIBC/Ferritin/ %Sat    Component Value Date/Time   IRON 74 09/19/2021 1321   TIBC 372 09/19/2021 1321   FERRITIN 33 09/19/2021 1321   IRONPCTSAT 20 09/19/2021 1321        ASSESSMENT & PLAN:  1. B12 deficiency   2. Pernicious anemia    Pernicious anemia, vitamin B12 deficiency.  Patient has a positive intrinsic  factor. Continue monthly vitamin B12 injection Labs reviewed and discussed with patient. Hemoglobin has normalized. Iron panel is stable. Vitamin B12 level has improved to 817.  Recommend patient to establish care with gastroenterology for evaluation of pernicious anemia.  she is also due for colonoscopy  Orders Placed This Encounter  Procedures   CBC with Differential/Platelet    Standing Status:   Future    Standing Expiration Date:   09/19/2022   Iron and TIBC    Standing Status:   Future    Standing Expiration Date:   09/19/2022   Ferritin    Standing Status:   Future    Standing Expiration Date:   09/19/2022   Retic Panel    Standing Status:   Future    Standing Expiration Date:   09/19/2022   Vitamin B12    Standing Status:   Future    Standing Expiration Date:   09/19/2022   Ambulatory referral to Gastroenterology    Referral Priority:   Routine    Referral Type:   Consultation    Referral Reason:   Specialty Services Required    Referred to Provider:   Lin Landsman, MD    Number of Visits Requested:   1    All questions were answered. The patient knows to call the clinic with any problems questions or concerns. [place]  Return of visit: 6 months.   Earlie Server, MD, PhD 09/19/2021

## 2021-09-26 ENCOUNTER — Inpatient Hospital Stay: Payer: Medicare Other

## 2021-10-03 ENCOUNTER — Inpatient Hospital Stay: Payer: Medicare Other

## 2021-10-03 ENCOUNTER — Encounter: Payer: Medicare Other | Admitting: Certified Nurse Midwife

## 2021-10-05 ENCOUNTER — Encounter: Payer: Self-pay | Admitting: Certified Nurse Midwife

## 2021-10-08 ENCOUNTER — Ambulatory Visit: Payer: Medicare Other | Admitting: Urology

## 2021-10-10 ENCOUNTER — Inpatient Hospital Stay: Payer: Medicare Other | Attending: Oncology

## 2021-10-10 DIAGNOSIS — E538 Deficiency of other specified B group vitamins: Secondary | ICD-10-CM | POA: Insufficient documentation

## 2021-10-15 ENCOUNTER — Ambulatory Visit: Payer: Medicare Other | Admitting: Urology

## 2021-10-17 ENCOUNTER — Other Ambulatory Visit: Payer: Self-pay

## 2021-10-17 ENCOUNTER — Inpatient Hospital Stay: Payer: Medicare Other

## 2021-10-17 DIAGNOSIS — E538 Deficiency of other specified B group vitamins: Secondary | ICD-10-CM

## 2021-10-17 MED ORDER — CYANOCOBALAMIN 1000 MCG/ML IJ SOLN
1000.0000 ug | Freq: Once | INTRAMUSCULAR | Status: AC
  Administered 2021-10-17: 14:00:00 1000 ug via INTRAMUSCULAR
  Filled 2021-10-17: qty 1

## 2021-10-24 ENCOUNTER — Inpatient Hospital Stay: Payer: Medicare Other

## 2021-10-31 ENCOUNTER — Inpatient Hospital Stay: Payer: Medicare Other

## 2021-11-16 ENCOUNTER — Telehealth: Payer: Self-pay | Admitting: Oncology

## 2021-11-16 NOTE — Telephone Encounter (Signed)
Caregiver called to see when pt could get her B12 shot. Missed appt on 12-28, call back at [phone]

## 2021-11-16 NOTE — Telephone Encounter (Signed)
Spoke with pt, she is coming in 1/16 for b12

## 2021-11-19 ENCOUNTER — Telehealth: Payer: Self-pay | Admitting: Oncology

## 2021-11-19 ENCOUNTER — Inpatient Hospital Stay: Payer: Medicare Other | Attending: Oncology

## 2021-11-19 DIAGNOSIS — E538 Deficiency of other specified B group vitamins: Secondary | ICD-10-CM | POA: Insufficient documentation

## 2021-11-19 NOTE — Telephone Encounter (Signed)
Pt called to reschedule her appt for today. Call back at [phone]

## 2021-11-20 ENCOUNTER — Telehealth: Payer: Self-pay | Admitting: Oncology

## 2021-11-20 NOTE — Telephone Encounter (Signed)
Pt called to see if she can come in 1-18 to get her B-12 shot since she missed appt on 1-16. Call back at [phone]

## 2021-11-21 ENCOUNTER — Inpatient Hospital Stay: Payer: Medicare Other

## 2021-11-21 ENCOUNTER — Ambulatory Visit: Payer: Medicare Other | Admitting: Gastroenterology

## 2021-11-22 ENCOUNTER — Telehealth: Payer: Self-pay

## 2021-11-22 NOTE — Telephone Encounter (Signed)
CALLED PATIENT NO ANSWER LEFT VOICEMAIL FOR A CALL BACK To schedule in person visit

## 2021-11-23 ENCOUNTER — Telehealth: Payer: Self-pay

## 2021-11-23 NOTE — Telephone Encounter (Signed)
Called patient someone  hung up on me sent letter out

## 2021-11-26 ENCOUNTER — Inpatient Hospital Stay: Payer: Medicare Other

## 2021-11-26 ENCOUNTER — Telehealth: Payer: Self-pay | Admitting: Oncology

## 2021-11-26 ENCOUNTER — Ambulatory Visit: Payer: Medicare Other | Admitting: Urology

## 2021-11-26 NOTE — Telephone Encounter (Signed)
Pt called for an appt for her B-12.informed her that she had a appt today. States that she didn't know about it for today. Would like to get one for tomorrow.

## 2021-11-27 ENCOUNTER — Inpatient Hospital Stay: Payer: Medicare Other

## 2021-11-27 ENCOUNTER — Other Ambulatory Visit: Payer: Self-pay

## 2021-11-27 DIAGNOSIS — E538 Deficiency of other specified B group vitamins: Secondary | ICD-10-CM | POA: Diagnosis present

## 2021-11-27 MED ORDER — CYANOCOBALAMIN 1000 MCG/ML IJ SOLN
1000.0000 ug | Freq: Once | INTRAMUSCULAR | Status: AC
  Administered 2021-11-27: 13:00:00 1000 ug via INTRAMUSCULAR
  Filled 2021-11-27: qty 1

## 2021-11-28 ENCOUNTER — Encounter: Payer: Medicare Other | Admitting: Certified Nurse Midwife

## 2021-12-03 ENCOUNTER — Inpatient Hospital Stay: Payer: Medicare Other

## 2021-12-10 ENCOUNTER — Inpatient Hospital Stay: Payer: Medicare Other | Attending: Oncology

## 2021-12-24 ENCOUNTER — Telehealth: Payer: Self-pay | Admitting: *Deleted

## 2021-12-24 NOTE — Telephone Encounter (Signed)
Spoke to pt, she is aware and in agreement of appts.

## 2021-12-24 NOTE — Telephone Encounter (Signed)
Patient called to express concern that she is no longer receiving B12 injections once a month.

## 2021-12-24 NOTE — Telephone Encounter (Signed)
Please schedule patient for monthly B12 inj unitl she sees Dr.Yu in May (last dose was on 1/24). Also, for appt in may please move lab to be 1-2 days prior to MD/ B12.  Please inform/ update pts of appts.

## 2021-12-28 ENCOUNTER — Emergency Department: Payer: Medicare Other

## 2021-12-28 ENCOUNTER — Other Ambulatory Visit: Payer: Self-pay

## 2021-12-28 ENCOUNTER — Inpatient Hospital Stay
Admission: EM | Admit: 2021-12-28 | Discharge: 2022-01-01 | DRG: 690 | Disposition: A | Discharge status: Skilled Nursing Facility | Payer: Medicare Other | Attending: Internal Medicine | Admitting: Internal Medicine

## 2021-12-28 ENCOUNTER — Encounter: Payer: Self-pay | Admitting: Internal Medicine

## 2021-12-28 DIAGNOSIS — G894 Chronic pain syndrome: Secondary | ICD-10-CM | POA: Diagnosis present

## 2021-12-28 DIAGNOSIS — E1122 Type 2 diabetes mellitus with diabetic chronic kidney disease: Secondary | ICD-10-CM | POA: Diagnosis present

## 2021-12-28 DIAGNOSIS — N179 Acute kidney failure, unspecified: Secondary | ICD-10-CM

## 2021-12-28 DIAGNOSIS — I10 Essential (primary) hypertension: Secondary | ICD-10-CM | POA: Diagnosis present

## 2021-12-28 DIAGNOSIS — I959 Hypotension, unspecified: Secondary | ICD-10-CM | POA: Diagnosis not present

## 2021-12-28 DIAGNOSIS — R531 Weakness: Secondary | ICD-10-CM

## 2021-12-28 DIAGNOSIS — R079 Chest pain, unspecified: Secondary | ICD-10-CM | POA: Diagnosis present

## 2021-12-28 DIAGNOSIS — M1990 Unspecified osteoarthritis, unspecified site: Secondary | ICD-10-CM | POA: Diagnosis present

## 2021-12-28 DIAGNOSIS — Z794 Long term (current) use of insulin: Secondary | ICD-10-CM

## 2021-12-28 DIAGNOSIS — B951 Streptococcus, group B, as the cause of diseases classified elsewhere: Secondary | ICD-10-CM | POA: Diagnosis present

## 2021-12-28 DIAGNOSIS — R7989 Other specified abnormal findings of blood chemistry: Secondary | ICD-10-CM | POA: Diagnosis present

## 2021-12-28 DIAGNOSIS — E785 Hyperlipidemia, unspecified: Secondary | ICD-10-CM | POA: Diagnosis present

## 2021-12-28 DIAGNOSIS — G4733 Obstructive sleep apnea (adult) (pediatric): Secondary | ICD-10-CM | POA: Diagnosis present

## 2021-12-28 DIAGNOSIS — Z79890 Hormone replacement therapy (postmenopausal): Secondary | ICD-10-CM

## 2021-12-28 DIAGNOSIS — N1832 Chronic kidney disease, stage 3b: Secondary | ICD-10-CM | POA: Diagnosis present

## 2021-12-28 DIAGNOSIS — R0789 Other chest pain: Secondary | ICD-10-CM

## 2021-12-28 DIAGNOSIS — M7989 Other specified soft tissue disorders: Secondary | ICD-10-CM | POA: Diagnosis present

## 2021-12-28 DIAGNOSIS — K591 Functional diarrhea: Secondary | ICD-10-CM | POA: Diagnosis present

## 2021-12-28 DIAGNOSIS — Z96659 Presence of unspecified artificial knee joint: Secondary | ICD-10-CM | POA: Diagnosis present

## 2021-12-28 DIAGNOSIS — E119 Type 2 diabetes mellitus without complications: Secondary | ICD-10-CM

## 2021-12-28 DIAGNOSIS — I5032 Chronic diastolic (congestive) heart failure: Secondary | ICD-10-CM | POA: Diagnosis present

## 2021-12-28 DIAGNOSIS — N189 Chronic kidney disease, unspecified: Secondary | ICD-10-CM

## 2021-12-28 DIAGNOSIS — E861 Hypovolemia: Secondary | ICD-10-CM | POA: Diagnosis present

## 2021-12-28 DIAGNOSIS — E1165 Type 2 diabetes mellitus with hyperglycemia: Secondary | ICD-10-CM | POA: Diagnosis present

## 2021-12-28 DIAGNOSIS — Z79899 Other long term (current) drug therapy: Secondary | ICD-10-CM

## 2021-12-28 DIAGNOSIS — R000 Tachycardia, unspecified: Secondary | ICD-10-CM | POA: Diagnosis present

## 2021-12-28 DIAGNOSIS — Z955 Presence of coronary angioplasty implant and graft: Secondary | ICD-10-CM

## 2021-12-28 DIAGNOSIS — R300 Dysuria: Secondary | ICD-10-CM | POA: Diagnosis present

## 2021-12-28 DIAGNOSIS — E875 Hyperkalemia: Secondary | ICD-10-CM

## 2021-12-28 DIAGNOSIS — F32A Depression, unspecified: Secondary | ICD-10-CM | POA: Diagnosis present

## 2021-12-28 DIAGNOSIS — E039 Hypothyroidism, unspecified: Secondary | ICD-10-CM | POA: Diagnosis present

## 2021-12-28 DIAGNOSIS — N390 Urinary tract infection, site not specified: Secondary | ICD-10-CM | POA: Diagnosis not present

## 2021-12-28 DIAGNOSIS — E1169 Type 2 diabetes mellitus with other specified complication: Secondary | ICD-10-CM | POA: Diagnosis present

## 2021-12-28 DIAGNOSIS — Z6841 Body Mass Index (BMI) 40.0 and over, adult: Secondary | ICD-10-CM

## 2021-12-28 DIAGNOSIS — I130 Hypertensive heart and chronic kidney disease with heart failure and stage 1 through stage 4 chronic kidney disease, or unspecified chronic kidney disease: Secondary | ICD-10-CM | POA: Diagnosis present

## 2021-12-28 DIAGNOSIS — E1140 Type 2 diabetes mellitus with diabetic neuropathy, unspecified: Secondary | ICD-10-CM | POA: Diagnosis present

## 2021-12-28 DIAGNOSIS — Z20822 Contact with and (suspected) exposure to covid-19: Secondary | ICD-10-CM | POA: Diagnosis present

## 2021-12-28 DIAGNOSIS — Z789 Other specified health status: Secondary | ICD-10-CM | POA: Diagnosis present

## 2021-12-28 DIAGNOSIS — K219 Gastro-esophageal reflux disease without esophagitis: Secondary | ICD-10-CM | POA: Diagnosis present

## 2021-12-28 DIAGNOSIS — Z1611 Resistance to penicillins: Secondary | ICD-10-CM | POA: Diagnosis present

## 2021-12-28 DIAGNOSIS — F410 Panic disorder [episodic paroxysmal anxiety] without agoraphobia: Secondary | ICD-10-CM | POA: Diagnosis present

## 2021-12-28 DIAGNOSIS — F411 Generalized anxiety disorder: Secondary | ICD-10-CM | POA: Diagnosis not present

## 2021-12-28 DIAGNOSIS — M810 Age-related osteoporosis without current pathological fracture: Secondary | ICD-10-CM | POA: Diagnosis present

## 2021-12-28 DIAGNOSIS — B9620 Unspecified Escherichia coli [E. coli] as the cause of diseases classified elsewhere: Secondary | ICD-10-CM | POA: Diagnosis present

## 2021-12-28 DIAGNOSIS — Z7984 Long term (current) use of oral hypoglycemic drugs: Secondary | ICD-10-CM

## 2021-12-28 DIAGNOSIS — Z7982 Long term (current) use of aspirin: Secondary | ICD-10-CM

## 2021-12-28 DIAGNOSIS — F419 Anxiety disorder, unspecified: Secondary | ICD-10-CM | POA: Diagnosis present

## 2021-12-28 DIAGNOSIS — E11649 Type 2 diabetes mellitus with hypoglycemia without coma: Secondary | ICD-10-CM | POA: Diagnosis not present

## 2021-12-28 DIAGNOSIS — I2510 Atherosclerotic heart disease of native coronary artery without angina pectoris: Secondary | ICD-10-CM | POA: Diagnosis present

## 2021-12-28 LAB — BASIC METABOLIC PANEL
Anion gap: 10 (ref 5–15)
BUN: 65 mg/dL — ABNORMAL HIGH (ref 8–23)
CO2: 24 mmol/L (ref 22–32)
Calcium: 8.8 mg/dL — ABNORMAL LOW (ref 8.9–10.3)
Chloride: 101 mmol/L (ref 98–111)
Creatinine, Ser: 1.95 mg/dL — ABNORMAL HIGH (ref 0.44–1.00)
GFR, Estimated: 27 mL/min — ABNORMAL LOW (ref >60)
Glucose, Bld: 261 mg/dL — ABNORMAL HIGH (ref 70–99)
Potassium: 5.5 mmol/L — ABNORMAL HIGH (ref 3.5–5.1)
Sodium: 135 mmol/L (ref 135–145)

## 2021-12-28 LAB — CBC
HCT: 35.1 % — ABNORMAL LOW (ref 36.0–46.0)
Hemoglobin: 10.8 g/dL — ABNORMAL LOW (ref 12.0–15.0)
MCH: 29.6 pg (ref 26.0–34.0)
MCHC: 30.8 g/dL (ref 30.0–36.0)
MCV: 96.2 fL (ref 80.0–100.0)
Platelets: 173 10*3/uL (ref 150–400)
RBC: 3.65 MIL/uL — ABNORMAL LOW (ref 3.87–5.11)
RDW: 15.1 % (ref 11.5–15.5)
WBC: 6.1 10*3/uL (ref 4.0–10.5)
nRBC: 0 % (ref 0.0–0.2)

## 2021-12-28 LAB — RESP PANEL BY RT-PCR (FLU A&B, COVID) ARPGX2
Influenza A by PCR: NEGATIVE
Influenza B by PCR: NEGATIVE
SARS Coronavirus 2 by RT PCR: NEGATIVE

## 2021-12-28 LAB — CBG MONITORING, ED
Glucose-Capillary: 162 mg/dL — ABNORMAL HIGH (ref 70–99)
Glucose-Capillary: 217 mg/dL — ABNORMAL HIGH (ref 70–99)

## 2021-12-28 LAB — BRAIN NATRIURETIC PEPTIDE: B Natriuretic Peptide: 8.8 pg/mL (ref 0.0–100.0)

## 2021-12-28 LAB — TROPONIN I (HIGH SENSITIVITY)
Troponin I (High Sensitivity): 5 ng/L (ref <18)
Troponin I (High Sensitivity): 4 ng/L (ref <18)

## 2021-12-28 LAB — APTT: aPTT: 32 seconds (ref 24–36)

## 2021-12-28 LAB — D-DIMER, QUANTITATIVE: D-Dimer, Quant: 1.8 ug/mL-FEU — ABNORMAL HIGH (ref 0.00–0.50)

## 2021-12-28 LAB — PROTIME-INR
INR: 1.1 (ref 0.8–1.2)
Prothrombin Time: 13.8 seconds (ref 11.4–15.2)

## 2021-12-28 IMAGING — DX DG CHEST 1V PORT
1 series · 1 of 1 positions shown · non-contrast
Comparison: 06/15/2020

CLINICAL DATA: Altered mental status, recent postoperative lumbar
surgery

EXAM:
PORTABLE CHEST 1 VIEW

[chest ap]
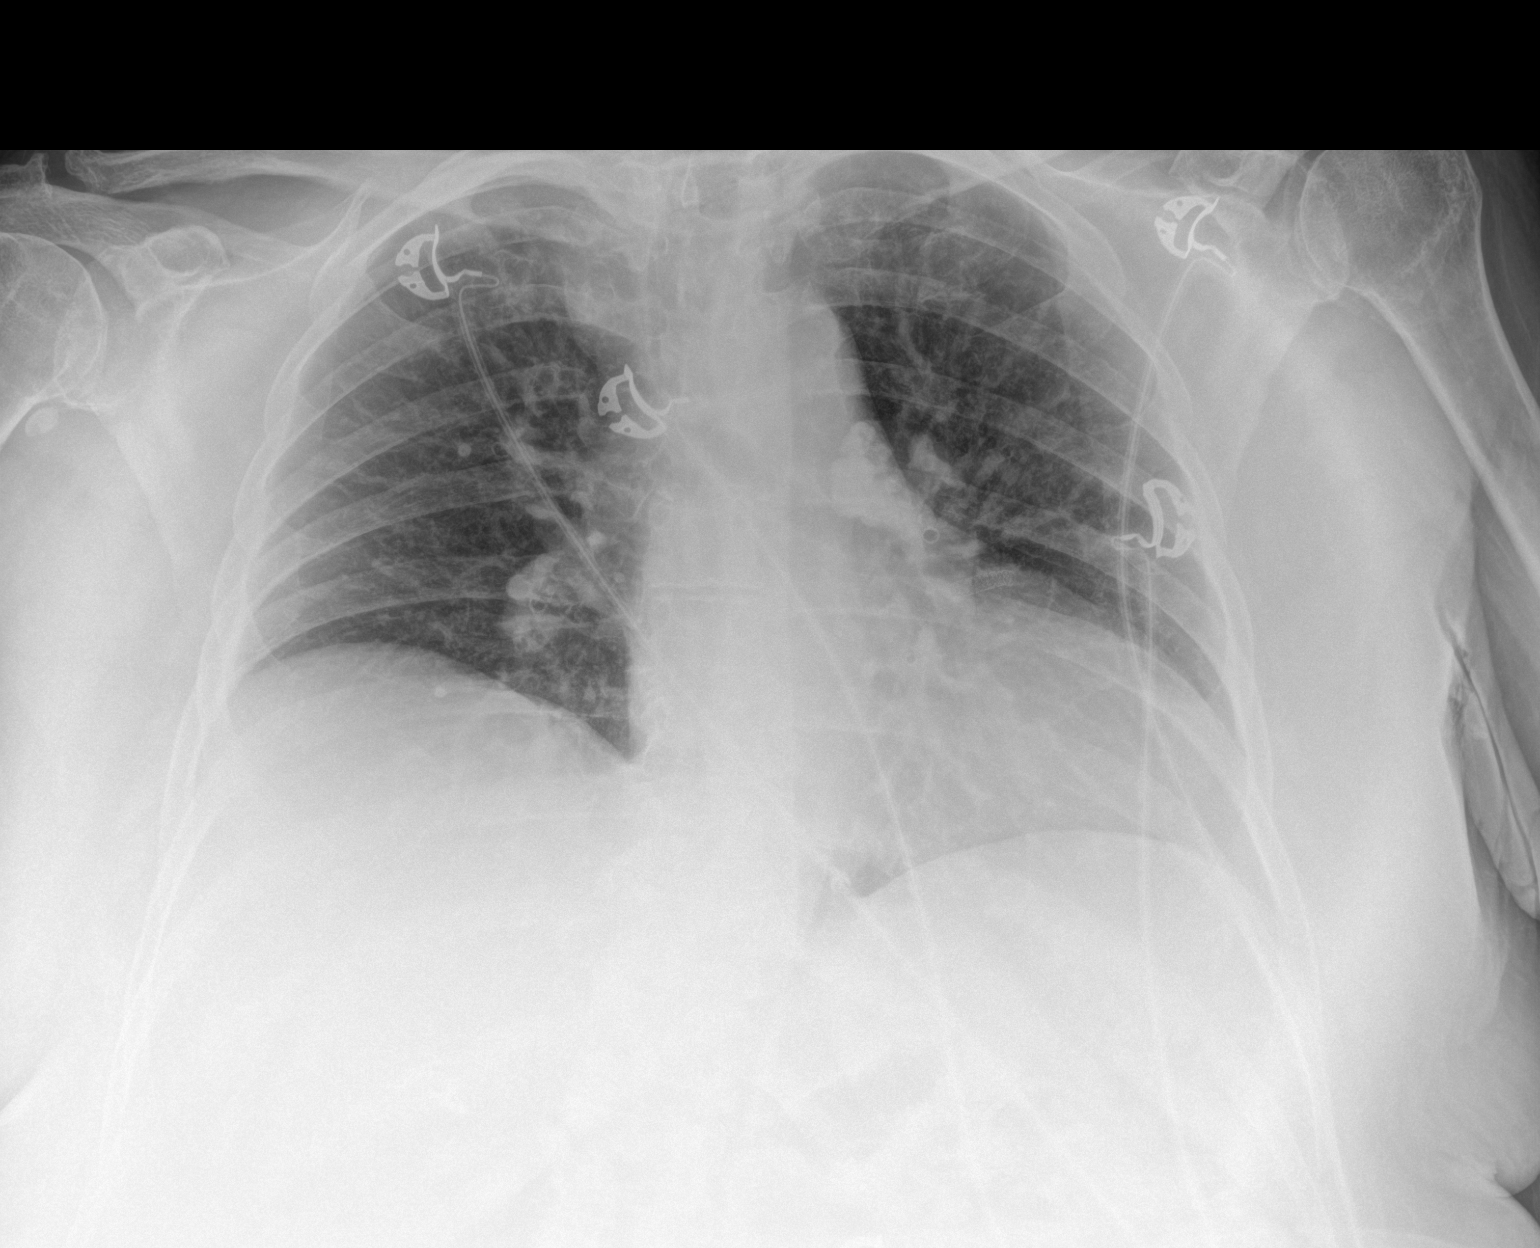

[1 of 1 positions shown; findings below may reference images not displayed]

FINDINGS: Redemonstrated cardiomegaly. Unchanged elevation of the right
hemidiaphragm. The visualized skeletal structures are unremarkable.
IMPRESSION: 1. Redemonstrated cardiomegaly. No acute abnormality of the lungs.
2. Unchanged elevation of the right hemidiaphragm.

## 2021-12-28 MED ORDER — LIDOCAINE 5 % EX PTCH
1.0000 | MEDICATED_PATCH | CUTANEOUS | Status: DC
  Administered 2021-12-28: 1 via TRANSDERMAL
  Filled 2021-12-28: qty 1

## 2021-12-28 MED ORDER — GABAPENTIN 600 MG PO TABS
1200.0000 mg | ORAL_TABLET | Freq: Two times a day (BID) | ORAL | Status: DC
  Administered 2021-12-29 – 2022-01-01 (×8): 1200 mg via ORAL
  Filled 2021-12-28 (×9): qty 2

## 2021-12-28 MED ORDER — HEPARIN SODIUM (PORCINE) 5000 UNIT/ML IJ SOLN: 5000.0000 [IU] | Freq: Three times a day (TID) | INTRAMUSCULAR | Status: DC

## 2021-12-28 MED ORDER — INSULIN ASPART 100 UNIT/ML IJ SOLN
0.0000 [IU] | Freq: Three times a day (TID) | INTRAMUSCULAR | Status: DC
  Administered 2021-12-30: 4 [IU] via SUBCUTANEOUS
  Administered 2021-12-30: 7 [IU] via SUBCUTANEOUS
  Administered 2021-12-31 (×2): 4 [IU] via SUBCUTANEOUS
  Administered 2021-12-31: 7 [IU] via SUBCUTANEOUS
  Administered 2022-01-01: 3 [IU] via SUBCUTANEOUS
  Filled 2021-12-28 (×6): qty 1

## 2021-12-28 MED ORDER — ATORVASTATIN CALCIUM 20 MG PO TABS
40.0000 mg | ORAL_TABLET | Freq: Every day | ORAL | Status: DC
  Administered 2021-12-29 – 2022-01-01 (×4): 40 mg via ORAL
  Filled 2021-12-28 (×4): qty 2

## 2021-12-28 MED ORDER — INSULIN GLARGINE-YFGN 100 UNIT/ML ~~LOC~~ SOLN
72.0000 [IU] | Freq: Every day | SUBCUTANEOUS | Status: DC
  Administered 2021-12-29 (×2): 72 [IU] via SUBCUTANEOUS
  Filled 2021-12-28 (×3): qty 0.72

## 2021-12-28 MED ORDER — LEVOTHYROXINE SODIUM 50 MCG PO TABS
75.0000 ug | ORAL_TABLET | Freq: Every day | ORAL | Status: DC
  Administered 2021-12-29 – 2022-01-01 (×4): 75 ug via ORAL
  Filled 2021-12-28 (×2): qty 2
  Filled 2021-12-28 (×2): qty 1

## 2021-12-28 MED ORDER — SODIUM CHLORIDE 0.9 % IV SOLN: INTRAVENOUS | Status: AC

## 2021-12-28 MED ORDER — OXYCODONE HCL 5 MG PO TABS
5.0000 mg | ORAL_TABLET | ORAL | Status: DC | PRN
  Administered 2021-12-29 – 2021-12-30 (×4): 5 mg via ORAL
  Filled 2021-12-28 (×4): qty 1

## 2021-12-28 MED ORDER — HEPARIN BOLUS VIA INFUSION
4800.0000 [IU] | Freq: Once | INTRAVENOUS | Status: AC
  Administered 2021-12-28: 4800 [IU] via INTRAVENOUS
  Filled 2021-12-28: qty 4800

## 2021-12-28 MED ORDER — PANTOPRAZOLE SODIUM 40 MG PO TBEC
40.0000 mg | DELAYED_RELEASE_TABLET | Freq: Every day | ORAL | Status: DC | PRN
  Administered 2021-12-29 – 2021-12-31 (×2): 40 mg via ORAL
  Filled 2021-12-28 (×2): qty 1

## 2021-12-28 MED ORDER — METHOCARBAMOL 500 MG PO TABS
750.0000 mg | ORAL_TABLET | Freq: Three times a day (TID) | ORAL | Status: DC
  Administered 2021-12-29 – 2022-01-01 (×9): 750 mg via ORAL
  Filled 2021-12-28: qty 2
  Filled 2021-12-28: qty 1
  Filled 2021-12-28: qty 2
  Filled 2021-12-28: qty 1
  Filled 2021-12-28: qty 2
  Filled 2021-12-28 (×2): qty 1
  Filled 2021-12-28: qty 1.5
  Filled 2021-12-28: qty 2
  Filled 2021-12-28 (×2): qty 1
  Filled 2021-12-28: qty 2
  Filled 2021-12-28 (×2): qty 1
  Filled 2021-12-28 (×2): qty 2

## 2021-12-28 MED ORDER — INSULIN ASPART 100 UNIT/ML IJ SOLN
5.0000 [IU] | Freq: Once | INTRAMUSCULAR | Status: AC
Start: 2021-12-28 — End: 2021-12-28
  Administered 2021-12-28: 5 [IU] via INTRAVENOUS
  Filled 2021-12-28: qty 1

## 2021-12-28 MED ORDER — HEPARIN (PORCINE) 25000 UT/250ML-% IV SOLN
1400.0000 [IU]/h | INTRAVENOUS | Status: DC
  Administered 2021-12-28: 1400 [IU]/h via INTRAVENOUS
  Filled 2021-12-28: qty 250

## 2021-12-28 MED ORDER — INSULIN ASPART 100 UNIT/ML IJ SOLN
0.0000 [IU] | Freq: Every day | INTRAMUSCULAR | Status: DC
  Administered 2021-12-29: 2 [IU] via SUBCUTANEOUS
  Filled 2021-12-28: qty 1

## 2021-12-28 MED ORDER — SODIUM CHLORIDE 0.9 % IV BOLUS
500.0000 mL | Freq: Once | INTRAVENOUS | Status: AC
  Administered 2021-12-28: 500 mL via INTRAVENOUS

## 2021-12-28 MED ORDER — FUROSEMIDE 10 MG/ML IJ SOLN
60.0000 mg | Freq: Once | INTRAMUSCULAR | Status: AC
  Administered 2021-12-28: 60 mg via INTRAVENOUS
  Filled 2021-12-28: qty 8

## 2021-12-28 MED ORDER — HEPARIN SODIUM (PORCINE) 5000 UNIT/ML IJ SOLN: 4000.0000 [IU] | Freq: Once | INTRAMUSCULAR | Status: DC

## 2021-12-28 MED ORDER — ONDANSETRON HCL 4 MG PO TABS: 4.0000 mg | ORAL_TABLET | Freq: Four times a day (QID) | ORAL | Status: DC | PRN

## 2021-12-28 MED ORDER — ACETAMINOPHEN 500 MG PO TABS
1000.0000 mg | ORAL_TABLET | Freq: Once | ORAL | Status: AC
  Administered 2021-12-28: 1000 mg via ORAL
  Filled 2021-12-28: qty 2

## 2021-12-28 MED ORDER — ONDANSETRON HCL 4 MG/2ML IJ SOLN
4.0000 mg | Freq: Four times a day (QID) | INTRAMUSCULAR | Status: DC | PRN
  Administered 2021-12-31 (×2): 4 mg via INTRAVENOUS
  Filled 2021-12-28 (×2): qty 2

## 2021-12-28 MED ORDER — FERROUS SULFATE 325 (65 FE) MG PO TABS
325.0000 mg | ORAL_TABLET | Freq: Two times a day (BID) | ORAL | Status: DC
  Administered 2021-12-29 – 2022-01-01 (×7): 325 mg via ORAL
  Filled 2021-12-28 (×7): qty 1

## 2021-12-28 MED ORDER — HEPARIN (PORCINE) 25000 UT/250ML-% IV SOLN: 1400.0000 [IU]/h | INTRAVENOUS | Status: DC

## 2021-12-28 MED ORDER — ACETAMINOPHEN 325 MG PO TABS: 650.0000 mg | ORAL_TABLET | Freq: Four times a day (QID) | ORAL | Status: DC | PRN

## 2021-12-28 MED ORDER — SODIUM CHLORIDE 0.9 % IV SOLN: 1.0000 g | INTRAVENOUS | Status: DC

## 2021-12-28 MED ORDER — LACTATED RINGERS IV BOLUS
1000.0000 mL | Freq: Once | INTRAVENOUS | Status: AC
  Administered 2021-12-28: 1000 mL via INTRAVENOUS

## 2021-12-28 MED ORDER — LIDOCAINE 5 % EX PTCH
2.0000 | MEDICATED_PATCH | CUTANEOUS | Status: DC
  Administered 2021-12-30: 2 via TRANSDERMAL
  Filled 2021-12-28 (×2): qty 2

## 2021-12-28 MED ORDER — ACETAMINOPHEN 650 MG RE SUPP: 650.0000 mg | Freq: Four times a day (QID) | RECTAL | Status: DC | PRN

## 2021-12-28 MED ORDER — FUROSEMIDE 10 MG/ML IJ SOLN: 40.0000 mg | Freq: Every day | INTRAMUSCULAR | Status: DC

## 2021-12-28 NOTE — ED Triage Notes (Signed)
Pt comes into the ED via [facility] from her MD office c/o upper back pain that radiates into the front.  Pt states the pain is better with palpation.  Pt has also had diarrhea for a couple weeks.  EKg at MD office was unremarkable. 324 ASA given.  20g R AC.   95/50 104 HR 333 CBG 97% RA

## 2021-12-28 NOTE — ED Notes (Signed)
Pt given a sandwich tray and water

## 2021-12-28 NOTE — ED Notes (Signed)
Unable to do covid test at this time, swab not available.

## 2021-12-28 NOTE — Assessment & Plan Note (Signed)
-   Continue heparin GTT - Lower extremity ultrasound was negative for DVT - CTA of the chest to assess for PE has not been ordered due to acute kidney injury

## 2021-12-28 NOTE — Assessment & Plan Note (Addendum)
-   Etiology work-up in progress, presumed multifactorial - B 12 serum level ordered, check A1c, complete echo  - PT, OT, fall precautions ordered

## 2021-12-28 NOTE — Assessment & Plan Note (Addendum)
-   Bilateral lower extremity ultrasound was negative for DVT - Given that BNP is negative, I have low clinical suspicion that this is heart failure however because her BMI is elevated and reported weight gain, BNP value may be a false negative - Complete echo ordered - Strict I's and O's

## 2021-12-28 NOTE — H&P (Addendum)
History and Physical   [name] KDX:[id number] DOB: [birth year] DOA: 12/28/2021  PCP: Macarthur Critchley, MD  Patient coming from: clinic  I have personally briefly reviewed patient's old medical records in [place].  Chief Concern: back pain, chest pain, diarrhea, weight gain  HPI: [name] is a 76 year old female with history of obesity, insulin-dependent diabetes mellitus type 2, GERD, CKD3a, hypothyroid, depression, hyperlipidemia, depression, anxiety, neuropathy, who presents to the emergency department for chief concerns of chest pain and back pain.  Initial vitals in the emergency department showed temperature of 97.9, respiration rate of 18, heart rate of 106, blood pressure 83/49, improved to 130/92, SPO2 of 97% on room air.  Serum sodium 135, potassium 5.5, chloride 101, bicarb 24, BUN of 65, serum creatinine of 1.95, nonfasting blood glucose 261, WBC 6.1, hemoglobin 10.8, platelets of 173.    BNP was 8.8.  Troponin was 5.  D-dimer was 1.8, INR 1.5, PT 13.8.  Imaging done in ED: Portable chest x-ray low lung volumes without evidence of acute cardiopulmonary process.  No evidence of DVT in the either lower extremity.  ED treatment: Furosemide 60 mg IV, acetaminophen 1000 mg, insulin aspart 5 units, heparin bolus and GGT, LR 1 L bolus, sodium chloride 500 mL bolus. ___  She reports she has had back and chest pain that started about two days. It is most notable upon waking up in the AM. She denies known trauma. She describes the pain is aching. She denies having this before. She states the pain has been persistent. She states the pain is aching.   She goes to pain clinic and has her urine checked every month.   At bedside, she is able to tell me her name, age, and current year. The aching pain she is having is reproducible at the upper thoracic and chest area  with my mild and moderate palpation  She reports she is having diarrhea for about 3x/day for 1 month.  She reports the diarrhea is usually after a meal. She states that she frequently eats milk and cereal for meals. She feels generalized weakness like she wants to pass out. She endorses that she has never had a colonoscopy. She reports her blood glucose has been 400-500 has been elevated for years until her PCP started her on insulin in September. She was told to stop metformin in September of 2022.  She reports her knees are buckling on her she has difficulty standing. She reports she stopped taking metformin in September 2022. She started gaining weight after taking insulin.   She reports the last time she had diarrhea was yesterday. She has not had much to eat today due to being in the hospital and she has not had diarrhea today.  Social history: She has a housemate/roommate. She denies history of tobacco, etoh, and recreational drug use. She is retired and formerly was a Glass blower/designer.  Vaccination history: She is vaccinated for covid and influenza.   ROS: Constitutional: + weight change, no fever ENT/Mouth: no sore throat, no rhinorrhea Eyes: no eye pain, + blurry vision changes  Cardiovascular: + chest pain, + dyspnea,  + edema, no palpitations Respiratory: no cough, no sputum, no wheezing Gastrointestinal: no nausea, no vomiting, + diarrhea, no constipation Genitourinary: no urinary incontinence, + dysuria, no hematuria Musculoskeletal: no arthralgias, no myalgias Skin: no skin lesions, no pruritus, Neuro: + weakness, no loss of consciousness, no syncope Psych: no anxiety, no depression, + decrease appetite Heme/Lymph: no bruising, no  bleeding  ED Course: Discussed with EDP, patient requiring hospitalization for aki and chest pain.  Assessment/Plan  Principal Problem:   Chest pain Active Problems:   Hyperlipidemia   Depression   GERD (gastroesophageal reflux disease)   Arthritis   Chronic pain syndrome   Essential hypertension   CAD (coronary artery disease)   OSA  (obstructive sleep apnea)   CPAP ventilation treatment not tolerated   Morbid obesity with BMI of 45.0-49.9, adult (HCC)   Polypharmacy   Acute kidney injury superimposed on CKD (HCC)   Hyperkalemia   Hypothyroidism   Anxiety state   Functional diarrhea   Insulin dependent type 2 diabetes mellitus (HCC)   Elevated d-dimer   Swelling of lower extremity   Dysuria   Weakness   * Chest pain Assessment & Plan - Noncardiac chest pain - Musculoskeletal pain - Lidocaine patch ordered  Digestive Functional diarrhea Assessment & Plan - Heart/carb healthy diet with notes: no dairy. - GI panel and C. difficile ordered, low clinical suspicion for infectious diarrhea at this time - If patient's diarrhea does not resolve with the above diet modification, patient would benefit from GI evaluation for possible consideration of colonoscopy   AKI-presumed prerenal secondary to GI loss, however given weight gain and lower extremity swelling, cardiorenal cannot be excluded at this time - Complete echo ordered - Fluid replacement - BMP in the a.m.  Endocrine Insulin dependent type 2 diabetes mellitus (HCC) Assessment & Plan - Resumed home insulin glargine 15 units subcutaneous - A1c in the a.m. - Insulin SSI with obesity/insulin resistant dosing with bedtime coverage - Goal inpatient blood glucose levels 140-180  Other Weakness Assessment & Plan - Etiology work-up in progress, presumed multifactorial - B 12 serum level ordered, check A1c, complete echo  - PT, OT, fall precautions ordered  Dysuria Assessment & Plan - Present on admission - UA ordered  Swelling of lower extremity Assessment & Plan - Bilateral lower extremity ultrasound was negative for DVT - Given that BNP is negative, I have low clinical suspicion that this is heart failure however because her BMI is elevated and reported weight gain, BNP value may be a false negative - Complete echo ordered - Strict I's and  O's  Elevated d-dimer Assessment & Plan - Continue heparin GTT - Lower extremity ultrasound was negative for DVT - CTA of the chest to assess for PE has not been ordered due to acute kidney injury  Pending med reconciliation  Chart reviewed.   Left heart cath on 05/05/2018: Patient received DES to proximal LAD  DVT prophylaxis: Heparin Code Status: full code Diet: heart/carb healthy, no dairy. Family Communication: No Disposition Plan: Pending clinical course anticipate less than 2 night stay Consults called: None at this time Admission status: Telemetry cardiac, observation  Past Medical History:  Diagnosis Date   Amnesia    Anxiety    Arthritis    Back pain    CHF (congestive heart failure) (HCC)    Chronic kidney disease    Acute on chronic 10/2020   Chronic pain syndrome    Cystocele    Depression    Diabetes mellitus without complication (HCC)    Type II   Diabetes mellitus without complication (HCC)    GERD (gastroesophageal reflux disease)    History of blood transfusion    with knee placements   Hyperlipidemia    Hypertension    IBS (irritable bowel syndrome)    Obesity    Osteoporosis    Osteoporosis  Panic attacks    Seizure (HCC)    Shortness of breath dyspnea    Sleep apnea    not using now   Thyroid nodule    Past Surgical History:  Procedure Laterality Date   ABDOMINAL HYSTERECTOMY     APPENDECTOMY     BACK SURGERY     CARDIAC CATHETERIZATION     CHOLECYSTECTOMY     CORONARY STENT INTERVENTION N/A 05/05/2018   Procedure: CORONARY STENT INTERVENTION;  Surgeon: Isaias Cowman, MD;  Location: [facility];  Service: Cardiovascular;  Laterality: N/A;   FOOT SURGERY     LEFT HEART CATH AND CORONARY ANGIOGRAPHY Left 05/05/2018   Procedure: LEFT HEART CATH AND CORONARY ANGIOGRAPHY;  Surgeon: Isaias Cowman, MD;  Location: [facility];  Service: Cardiovascular;  Laterality: Left;   OOPHORECTOMY     REPLACEMENT TOTAL  KNEE     TONSILLECTOMY     Social History:  reports that she has never smoked. She has never used smokeless tobacco. She reports that she does not drink alcohol and does not use drugs.  No Known Allergies Family History  Problem Relation Age of Onset   Stomach cancer Mother    Cancer Father    Cancer - Lung Father    Kidney cancer Sister    Thyroid disease Neg Hx    Family history: Family history reviewed and not pertinent.  Prior to Admission medications   Medication Sig Start Date End Date Taking? Authorizing Provider  aspirin EC 81 MG tablet Take 81 mg by mouth daily. Swallow whole.    [provider]  atorvastatin (LIPITOR) 40 MG tablet Take 40 mg by mouth daily. 12/06/19   [provider]  BELSOMRA 15 MG TABS Take 15 mg by mouth daily. 03/16/21   [provider]  busPIRone (BUSPAR) 30 MG tablet Take 30 mg by mouth 2 (two) times daily.    [provider]  calcium-vitamin D (OSCAL 500/200 D-3) 500-200 MG-UNIT tablet Take 1 tablet by mouth 2 (two) times daily. 02/20/21 02/20/22  Fritzi Mandes, MD  Cholecalciferol 25 MCG (1000 UT) tablet Take 1,000 Units by mouth daily.    [provider]  DULoxetine (CYMBALTA) 60 MG capsule Take 60 mg by mouth in the morning.    [provider]  estradiol (ESTRACE) 0.1 MG/GM vaginal cream Apply one pea-sized amount around the opening of the urethra every day for 2 weeks, then 3 times weekly moving forward. 08/14/21   Vaillancourt, Aldona Bar, PA-C  ferrous sulfate 325 (65 FE) MG tablet Take 325 mg by mouth 2 (two) times daily with a meal.    [provider]  fluconazole (DIFLUCAN) 100 MG tablet Take 1 tablet (100 mg total) by mouth daily. X 7 days 08/27/21   Bjorn Loser, MD  furosemide (LASIX) 40 MG tablet Take 40 mg by mouth.    [provider]  gabapentin (NEURONTIN) 600 MG tablet Take 1,200 mg by mouth 2 (two) times daily.    [provider]  LANTUS SOLOSTAR 100 UNIT/ML  Solostar Pen Inject 15 Units into the skin at bedtime. Patient taking differently: Inject 72 Units into the skin at bedtime. 02/20/21   Fritzi Mandes, MD  levothyroxine (SYNTHROID) 75 MCG tablet Take 1 tablet (75 mcg total) by mouth daily before breakfast. 02/08/20   Lorella Nimrod, MD  lisinopril (ZESTRIL) 30 MG tablet Take 30 mg by mouth daily.    [provider]  metFORMIN (GLUCOPHAGE) 1000 MG tablet Take 1,000 mg  by mouth 2 (two) times daily with a meal.    [provider]  methocarbamol (ROBAXIN) 750 MG tablet Take 1 tablet (750 mg total) by mouth every 8 (eight) hours. 04/04/21   Dawley, Troy C, DO  metoprolol succinate (TOPROL-XL) 50 MG 24 hr tablet Take 50 mg by mouth daily. Take with or immediately following a meal.    [provider]  mirabegron ER (MYRBETRIQ) 50 MG TB24 tablet Take 1 tablet (50 mg total) by mouth daily. 08/27/21   Bjorn Loser, MD  nitrofurantoin (MACRODANTIN) 100 MG capsule Take 1 capsule (100 mg total) by mouth daily. 06/25/21   Bjorn Loser, MD  nystatin (MYCOSTATIN/NYSTOP) powder Apply 1 application topically 2 (two) times daily.    [provider]  oxyCODONE (OXY IR/ROXICODONE) 5 MG immediate release tablet Take 1 tablet (5 mg total) by mouth every 4 (four) hours as needed for moderate pain. 04/04/21   Dawley, Troy C, DO  pantoprazole (PROTONIX) 40 MG tablet Take 40 mg by mouth daily as needed (heartburn).    [provider]  QUEtiapine (SEROQUEL) 200 MG tablet Take 200 mg by mouth at bedtime.    [provider]  topiramate (TOPAMAX) 200 MG tablet Take 200 mg by mouth at bedtime. 12/22/20   [provider]  VIIBRYD 40 MG TABS Take 40 mg by mouth daily. 02/01/21   [provider]   Physical Exam: Vitals:   12/28/21 1604 12/28/21 1730 12/28/21 1830 12/28/21 1900  BP: (!) 130/92 (!) 96/51 111/62 (!) 97/55  Pulse: (!) 104 93 92 88  Resp: 18 10 13 17   Temp:      SpO2: 97% 100% 99% 99%  Weight:       Height:       Constitutional: appears age-appropriate, frail, NAD, calm, comfortable Eyes: PERRL, lids and conjunctivae normal ENMT: Mucous membranes are moist. Posterior pharynx clear of any exudate or lesions. Age-appropriate dentition. Hearing appropriate Neck: normal, supple, no masses, no thyromegaly Respiratory: clear to auscultation bilaterally, no wheezing, no crackles. Normal respiratory effort. No accessory muscle use.  Cardiovascular: Regular rate and rhythm, no murmurs / rubs / gallops.  Bilateral lower extremity swelling without pitting edema. 2+ pedal pulses. No carotid bruits.  Abdomen: Morbidly obese abdomen, no tenderness, no masses palpated, no hepatosplenomegaly. Bowel sounds positive.  Musculoskeletal: no clubbing / cyanosis. No joint deformity upper and lower extremities. Good ROM, no contractures, no atrophy. Normal muscle tone.  Skin: no rashes, lesions, ulcers. No induration Neurologic: Sensation intact. Strength 5/5 in all 4.  Psychiatric: Normal judgment and insight. Alert and oriented x 3. Normal mood.   EKG: independently reviewed, showing sinus tachycardia with rate of 106, QTc 433  Chest x-ray on Admission: I personally reviewed and I agree with radiologist reading as below.  DG Chest 2 View  Result Date: 12/28/2021 CLINICAL DATA:  Chest pain, weakness, back pain. Shortness of breath. Bilateral lower extremity edema. History of CHF. EXAM: CHEST - 2 VIEW COMPARISON:  CT examination dated Mar 29, 2021 FINDINGS: The heart size and mediastinal contours are within normal limits. Atherosclerotic calcification of the aortic arch. Low lung volumes with bibasilar atelectasis. No focal consolidation or pleural effusion. Calcified left hilar lymph node. The visualized skeletal structures are unremarkable. IMPRESSION: Low lung volumes without evidence of acute cardiopulmonary process. Electronically Signed   By: Keane Police D.O.   On: 12/28/2021 16:35   US Venous Img Lower  Bilateral  Result Date: 12/28/2021 CLINICAL DATA:  Lower extremity pain  and edema EXAM: BILATERAL LOWER EXTREMITY VENOUS DOPPLER ULTRASOUND TECHNIQUE: Gray-scale sonography with graded compression, as well as color Doppler and duplex ultrasound were performed to evaluate the lower extremity deep venous systems from the level of the common femoral vein and including the common femoral, femoral, profunda femoral, popliteal and calf veins including the posterior tibial, peroneal and gastrocnemius veins when visible. The superficial great saphenous vein was also interrogated. Spectral Doppler was utilized to evaluate flow at rest and with distal augmentation maneuvers in the common femoral, femoral and popliteal veins. COMPARISON:  08/17/2009 FINDINGS: RIGHT LOWER EXTREMITY Common Femoral Vein: No evidence of thrombus. Normal compressibility, respiratory phasicity and response to augmentation. Saphenofemoral Junction: No evidence of thrombus. Normal compressibility and flow on color Doppler imaging. Profunda Femoral Vein: No evidence of thrombus. Normal compressibility and flow on color Doppler imaging. Femoral Vein: No evidence of thrombus. Normal compressibility, respiratory phasicity and response to augmentation. Popliteal Vein: No evidence of thrombus. Normal compressibility, respiratory phasicity and response to augmentation. Calf Veins: No evidence of thrombus. Normal compressibility and flow on color Doppler imaging. Superficial Great Saphenous Vein: No evidence of thrombus. Normal compressibility. Venous Reflux:  None. Other Findings:  None. LEFT LOWER EXTREMITY Common Femoral Vein: No evidence of thrombus. Normal compressibility, respiratory phasicity and response to augmentation. Saphenofemoral Junction: No evidence of thrombus. Normal compressibility and flow on color Doppler imaging. Profunda Femoral Vein: No evidence of thrombus. Normal compressibility and flow on color Doppler imaging. Femoral Vein: No  evidence of thrombus. Normal compressibility, respiratory phasicity and response to augmentation. Popliteal Vein: No evidence of thrombus. Normal compressibility, respiratory phasicity and response to augmentation. Calf Veins: No evidence of thrombus. Normal compressibility and flow on color Doppler imaging. Superficial Great Saphenous Vein: No evidence of thrombus. Normal compressibility. Venous Reflux:  None. Other Findings:  None. IMPRESSION: No evidence of deep venous thrombosis in either lower extremity. Electronically Signed   By: Davina Poke D.O.   On: 12/28/2021 19:07    Labs on Admission: I have personally reviewed following labs  CBC: Recent Labs  Lab 12/28/21 1553  WBC 6.1  HGB 10.8*  HCT 35.1*  MCV 96.2  PLT 956   Basic Metabolic Panel: Recent Labs  Lab 12/28/21 1553  NA 135  K 5.5*  CL 101  CO2 24  GLUCOSE 261*  BUN 65*  CREATININE 1.95*  CALCIUM 8.8*   GFR: Estimated Creatinine Clearance: 30.6 mL/min (A) (by C-G formula based on SCr of 1.95 mg/dL (H)).  Coagulation Profile: Recent Labs  Lab 12/28/21 1713  INR 1.1   CBG: Recent Labs  Lab 12/28/21 1923  GLUCAP 162*   Urine analysis:    Component Value Date/Time   COLORURINE YELLOW (A) 02/15/2021 1057   APPEARANCEUR Cloudy (A) 08/27/2021 1432   LABSPEC 1.010 02/15/2021 1057   LABSPEC 1.024 01/30/2014 0026   PHURINE 5.0 02/15/2021 1057   GLUCOSEU 2+ (A) 08/27/2021 1432   GLUCOSEU >=500 01/30/2014 0026   HGBUR SMALL (A) 02/15/2021 1057   BILIRUBINUR Negative 08/27/2021 1432   BILIRUBINUR Negative 01/30/2014 0026   KETONESUR NEGATIVE 02/15/2021 1057   PROTEINUR Negative 08/27/2021 1432   PROTEINUR NEGATIVE 02/15/2021 1057   UROBILINOGEN 1.0 07/11/2008 1245   NITRITE Negative 08/27/2021 1432   NITRITE NEGATIVE 02/15/2021 1057   LEUKOCYTESUR Negative 08/27/2021 1432   LEUKOCYTESUR NEGATIVE 02/15/2021 1057   LEUKOCYTESUR Trace 01/30/2014 0026   Dr. Tobie Poet [facility]  If 7PM-7AM,  please contact overnight-coverage provider If 7AM-7PM, please contact day coverage provider [email or web address]  12/28/2021, 10:15 PM

## 2021-12-28 NOTE — Assessment & Plan Note (Signed)
-   Presumed prerenal secondary to GI loss however given weight gain and swelling of her lower extremity, cardiorenal cannot be excluded at this time - Complete echo ordered - Fluid replaced - BMP in the a.m.

## 2021-12-28 NOTE — Assessment & Plan Note (Signed)
-   Noncardiac chest pain - Musculoskeletal pain - Lidocaine patch ordered

## 2021-12-28 NOTE — Hospital Course (Signed)
[name] is a 76 year old female with history of obesity, insulin-dependent diabetes mellitus type 2, GERD, CKD3a, hypothyroid, depression, hyperlipidemia, depression, anxiety, neuropathy, who presents to the emergency department for chief concerns of chest pain and back pain.  Initial vitals in the emergency department showed temperature of 97.9, respiration rate of 18, heart rate of 106, blood pressure 83/49, improved to 130/92, SPO2 of 97% on room air.  Serum sodium 135, potassium 5.5, chloride 101, bicarb 24, BUN of 65, serum creatinine of 1.95, nonfasting blood glucose 261, WBC 6.1, hemoglobin 10.8, platelets of 173.    BNP was 8.8.  Troponin was 5.  D-dimer was 1.8, INR 1.5, PT 13.8.  Imaging done in ED: Portable chest x-ray low lung volumes without evidence of acute cardiopulmonary process.  No evidence of DVT in the either lower extremity.  ED treatment: Furosemide 60 mg IV, acetaminophen 1000 mg, insulin aspart 5 units, heparin bolus and GGT, LR 1 L bolus, sodium chloride 500 mL bolus.

## 2021-12-28 NOTE — ED Triage Notes (Signed)
Pt to ED via [facility] from PCP office. Pt reports onset of CP and back pain that started today. Pt also reports she has been having diarrhea x1 month. Pt hx, CHF, COPD and stent.

## 2021-12-28 NOTE — ED Notes (Signed)
Pt initial BP 83/49 and repeat 73/40

## 2021-12-28 NOTE — Assessment & Plan Note (Signed)
-   Present on admission - UA ordered

## 2021-12-28 NOTE — Assessment & Plan Note (Deleted)
-   Musculoskeletal pain - Lidocaine patch ordered

## 2021-12-28 NOTE — Assessment & Plan Note (Addendum)
-   Heart/carb healthy diet with notes: no dairy. - GI panel and C. difficile ordered, low clinical suspicion for infectious diarrhea at this time - If patient's diarrhea does not resolve with the above diet modification, patient would benefit from GI evaluation for possible consideration of colonoscopy

## 2021-12-28 NOTE — ED Notes (Signed)
Pt placed on purewick 

## 2021-12-28 NOTE — Assessment & Plan Note (Addendum)
-   Resumed home insulin glargine 15 units subcutaneous - A1c in the a.m. - Insulin SSI with obesity/insulin resistant dosing with bedtime coverage - Goal inpatient blood glucose levels 140-180

## 2021-12-28 NOTE — Progress Notes (Signed)
ANTICOAGULATION CONSULT NOTE - Initial Consult  Pharmacy Consult for Heparin drip Indication:  VTE treatment (Ddimer elevated and unable to do CTA d/ renal fxn)  No Known Allergies  Patient Measurements: Height: 5\' 3"  (160 cm) Weight: 112.9 kg (249 lb) IBW/kg (Calculated) : 52.4 Heparin Dosing Weight: 80 kg  Vital Signs: Temp: 97.9 F (36.6 C) (02/24 1551) BP: 96/51 (02/24 1730) Pulse Rate: 93 (02/24 1730)  Labs: Recent Labs    12/28/21 1553 12/28/21 1713  HGB 10.8*  --   HCT 35.1*  --   PLT 173  --   APTT  --  32  LABPROT  --  13.8  INR  --  1.1  CREATININE 1.95*  --   TROPONINIHS 5  --     Estimated Creatinine Clearance: 30.6 mL/min (A) (by C-G formula based on SCr of 1.95 mg/dL (H)).   Medical History: Past Medical History:  Diagnosis Date   Amnesia    Anxiety    Arthritis    Back pain    CHF (congestive heart failure) (HCC)    Chronic kidney disease    Acute on chronic 10/2020   Chronic pain syndrome    Cystocele    Depression    Diabetes mellitus without complication (HCC)    Type II   Diabetes mellitus without complication (HCC)    GERD (gastroesophageal reflux disease)    History of blood transfusion    with knee placements   Hyperlipidemia    Hypertension    IBS (irritable bowel syndrome)    Obesity    Osteoporosis    Osteoporosis    Panic attacks    Seizure (HCC)    Shortness of breath dyspnea    Sleep apnea    not using now   Thyroid nodule     Medications:  Scheduled:   heparin  4,800 Units Intravenous Once   lidocaine  1 patch Transdermal Q24H   Infusions:   heparin     lactated ringers      Assessment: 76 yo F to start Heparin drip for possible PE?Marland Kitchen Admit w/ CP. Ddimer elevation. Unable to get chest CT d/t renal fxn. Hgb 10.8  plt 173  INR 1.1  aPTT 32 ASA is only anticoag medication PTA per Med Rec  Goal of Therapy:  Heparin level 0.3-0.7 units/ml Monitor platelets by anticoagulation protocol: Yes   Plan:  Give  4800 units bolus x 1 Start heparin infusion at 1400 units/hr Check anti-Xa level in 8 hours and daily while on heparin Continue to monitor H&H and platelets  Meyer Arora A 12/28/2021,6:16 PM

## 2021-12-28 NOTE — ED Provider Notes (Signed)
[facility] Provider Note    Event Date/Time   First MD Initiated Contact with Patient 12/28/21 1552     (approximate)   History   Chest Pain   HPI  [name] is a 76 y.o. female who presents to the ED for evaluation of Chest Pain   Reviewed cardiology visit from August.  History of morbid obesity, HTN, HLD, DM.  CAD s/p stenting to the LAD.  Patient presents to the ED for the evaluation of acute back and chest pain, superimposed on 1 month of diarrhea and weakness.  She reports that she was getting ready this morning, she developed slow onset back pain between her shoulder blades, radiating forward to substernal chest pain.  She reports the pain has been consistent, moderately severe and present since this morning without remittance.  Reports feeling short of breath, because it hurts when she takes a deep breath.  Denies cough, fever, abdominal pain, emesis, dizziness, falls or syncope.  She reports 1 month of watery diarrhea, 3-5 episodes per day without hematochezia or melena.  Denies abdominal pain, emesis, dysuria or hematuria.  Reports exposure concerned that she may be hydrated or her electrolytes are deranged.  Reports over the past 2 weeks she has gained about 20 pounds of weight unintentionally despite adherence to her Lasix.  Reports increased lower extremity edema alongside this.  Physical Exam   Triage Vital Signs: ED Triage Vitals  Enc Vitals Group     BP 12/28/21 1548 (!) 83/49     Pulse Rate 12/28/21 1548 (!) 106     Resp 12/28/21 1548 18     Temp 12/28/21 1551 97.9 F (36.6 C)     Temp src --      SpO2 12/28/21 1548 97 %     Weight 12/28/21 1549 249 lb (112.9 kg)     Height 12/28/21 1549 5\' 3"  (1.6 m)     Head Circumference --      Peak Flow --      Pain Score 12/28/21 1548 9     Pain Loc --      Pain Edu? --      Excl. in [place]? --     Most recent vital signs: Vitals:   12/28/21 1604 12/28/21 1730  BP: (!) 130/92 (!)  96/51  Pulse: (!) 104 93  Resp: 18 10  Temp:    SpO2: 97% 100%    General: Awake, no distress.  Morbidly obese.  Pleasant and conversational.  Able to sit forward so I can examine her back. CV:  Good peripheral perfusion.  Tachycardic and regular Resp:  Normal effort.  CTA B Abd:  No distention.  Soft and nontender throughout MSK:  No deformity noted.  Trace pitting edema to bilateral lower extremities without overlying skin changes Neuro:  No focal deficits appreciated. Cranial nerves II through XII intact 5/5 strength and sensation in all 4 extremities Other:     ED Results / Procedures / Treatments   Labs (all labs ordered are listed, but only abnormal results are displayed) Labs Reviewed  BASIC METABOLIC PANEL - Abnormal; Notable for the following components:      Result Value   Potassium 5.5 (*)    Glucose, Bld 261 (*)    BUN 65 (*)    Creatinine, Ser 1.95 (*)    Calcium 8.8 (*)    GFR, Estimated 27 (*)    All other components within normal limits  CBC - Abnormal;  Notable for the following components:   RBC 3.65 (*)    Hemoglobin 10.8 (*)    HCT 35.1 (*)    All other components within normal limits  D-DIMER, QUANTITATIVE - Abnormal; Notable for the following components:   D-Dimer, Quant 1.80 (*)    All other components within normal limits  RESP PANEL BY RT-PCR (FLU A&B, COVID) ARPGX2  BRAIN NATRIURETIC PEPTIDE  APTT  PROTIME-INR  CBC  TROPONIN I (HIGH SENSITIVITY)  TROPONIN I (HIGH SENSITIVITY)    EKG Sinus tachycardia, rate of 106 bpm.  Normal axis and intervals.  No STEMI.  RADIOLOGY 2 view CXR reviewed by me with low lung volumes and no clear cardiopulmonary pathology.  Official radiology report(s): DG Chest 2 View  Result Date: 12/28/2021 CLINICAL DATA:  Chest pain, weakness, back pain. Shortness of breath. Bilateral lower extremity edema. History of CHF. EXAM: CHEST - 2 VIEW COMPARISON:  CT examination dated Mar 29, 2021 FINDINGS: The heart size and  mediastinal contours are within normal limits. Atherosclerotic calcification of the aortic arch. Low lung volumes with bibasilar atelectasis. No focal consolidation or pleural effusion. Calcified left hilar lymph node. The visualized skeletal structures are unremarkable. IMPRESSION: Low lung volumes without evidence of acute cardiopulmonary process. Electronically Signed   By: Keane Police D.O.   On: 12/28/2021 16:35    PROCEDURES and INTERVENTIONS:  .1-3 Lead EKG Interpretation Performed by: Vladimir Crofts, MD Authorized by: Vladimir Crofts, MD     Interpretation: abnormal     ECG rate:  104   ECG rate assessment: tachycardic     Rhythm: sinus tachycardia     Ectopy: none     Conduction: normal   .Critical Care Performed by: Vladimir Crofts, MD Authorized by: Vladimir Crofts, MD   Critical care provider statement:    Critical care time (minutes):  30   Critical care time was exclusive of:  Separately billable procedures and treating other patients   Critical care was necessary to treat or prevent imminent or life-threatening deterioration of the following conditions:  Circulatory failure and cardiac failure   Critical care was time spent personally by me on the following activities:  Development of treatment plan with patient or surrogate, discussions with consultants, evaluation of patient's response to treatment, examination of patient, ordering and review of laboratory studies, ordering and review of radiographic studies, ordering and performing treatments and interventions, pulse oximetry, re-evaluation of patient's condition and review of old charts  Medications  lidocaine (LIDODERM) 5 % 1 patch (1 patch Transdermal Patch Applied 12/28/21 1656)  lactated ringers bolus 1,000 mL (has no administration in time range)  heparin ADULT infusion 100 units/mL (25000 units/285mL) (has no administration in time range)  heparin bolus via infusion 4,800 Units (has no administration in time range)   acetaminophen (TYLENOL) tablet 1,000 mg (1,000 mg Oral Given 12/28/21 1646)  insulin aspart (novoLOG) injection 5 Units (5 Units Intravenous Given 12/28/21 1650)  sodium chloride 0.9 % bolus 500 mL (500 mLs Intravenous New Bag/Given 12/28/21 1645)  furosemide (LASIX) injection 60 mg (60 mg Intravenous Given 12/28/21 1647)     IMPRESSION / MDM / [place] / ED COURSE  I reviewed the triage vital signs and the nursing notes.  76 year old female presents to the ED with acute chest and back pain concerning for the possibility of acute PE, with evidence of AKI and hyperkalemia, require medical admission.  She is tachycardic and without hypoxia.  Blood pressures are soft and improved with IV fluids.  Blood work with AKI on CKD.  No leukocytosis to suggest sepsis.  Troponin is negative and EKG with sinus tachycardia.  D-dimer is elevated, and in conjunction with her novel chest and back pain with pleuritic discomfort, certainly concerning for an acute PE.  Unfortunately with her AKI, cannot perform CTA chest at this time.  We will initiate rehydration to hopefully correct her renal dysfunction.  Will empirically start heparinization.  We will add on venous ultrasounds of her lower extremities despite no clear external signs of DVT at this point.  We will consult with medicine for admission.  Clinical Course as of 12/28/21 1816  Fri Dec 28, 2021  1600 Repeat BP while I am in the room with an appropriately sized cuff is normotensive.  Suspect inappropriate cuff fitting upfront. [DS]  9201 I note her D-dimer is elevated.  But her low GFR precludes CTA chest at this point.  I called over nuclear medicine considering a VQ scan, but they have already left for the day. [DS]  0071 Reassessed.  Heart rate improving and reports persistent chest and back pain.  We discussed my concerns for electrolyte derangements, kidney dysfunction and the possibility of a PE.  I recommend admission and she is agreeable.  [DS]    Clinical Course User Index [DS] Vladimir Crofts, MD     FINAL CLINICAL IMPRESSION(S) / ED DIAGNOSES   Final diagnoses:  Hyperkalemia  Other chest pain  AKI (acute kidney injury) ([place])     Rx / DC Orders   ED Discharge Orders     None        Note:  This document was prepared using Dragon voice recognition software and may include unintentional dictation errors.   Vladimir Crofts, MD 12/28/21 [phone]

## 2021-12-28 NOTE — Progress Notes (Signed)
ANTICOAGULATION CONSULT NOTE - Initial Consult  Pharmacy Consult for Heparin drip Indication:  VTE treatment (Ddimer elevated and unable to do CTA d/ renal fxn)  No Known Allergies  Patient Measurements: Height: 5\' 3"  (160 cm) Weight: 112.9 kg (249 lb) IBW/kg (Calculated) : 52.4 Heparin Dosing Weight: 80 kg  Vital Signs: Temp: 97.9 F (36.6 C) (02/24 1551) BP: 97/55 (02/24 1900) Pulse Rate: 88 (02/24 1900)  Labs: Recent Labs    12/28/21 1553 12/28/21 1713 12/28/21 1837  HGB 10.8*  --   --   HCT 35.1*  --   --   PLT 173  --   --   APTT  --  32  --   LABPROT  --  13.8  --   INR  --  1.1  --   CREATININE 1.95*  --   --   TROPONINIHS 5  --  4     Estimated Creatinine Clearance: 30.6 mL/min (A) (by C-G formula based on SCr of 1.95 mg/dL (H)).   Medical History: Past Medical History:  Diagnosis Date   Amnesia    Anxiety    Arthritis    Back pain    CHF (congestive heart failure) (HCC)    Chronic kidney disease    Acute on chronic 10/2020   Chronic pain syndrome    Cystocele    Depression    Diabetes mellitus without complication (HCC)    Type II   Diabetes mellitus without complication (HCC)    GERD (gastroesophageal reflux disease)    History of blood transfusion    with knee placements   Hyperlipidemia    Hypertension    IBS (irritable bowel syndrome)    Obesity    Osteoporosis    Osteoporosis    Panic attacks    Seizure (HCC)    Shortness of breath dyspnea    Sleep apnea    not using now   Thyroid nodule     Medications:  Scheduled:   atorvastatin  40 mg Oral Daily   [START ON 12/29/2021] ferrous sulfate  325 mg Oral BID WC   gabapentin  1,200 mg Oral BID   [START ON 12/29/2021] insulin aspart  0-20 Units Subcutaneous TID WC   insulin aspart  0-5 Units Subcutaneous QHS   insulin glargine  72 Units Subcutaneous QHS   [START ON 12/29/2021] levothyroxine  75 mcg Oral QAC breakfast   [START ON 12/29/2021] lidocaine  2 patch Transdermal Q24H    methocarbamol  750 mg Oral Q8H   Infusions:   cefTRIAXone (ROCEPHIN)  IV     heparin      Assessment: 76 yo F to start Heparin drip for possible PE?Marland Kitchen Admit w/ CP. Ddimer elevation. Unable to get chest CT d/t renal fxn. Hgb 10.8  plt 173  INR 1.1  aPTT 32 ASA is only anticoag medication PTA per Med Rec  Goal of Therapy:  Heparin level 0.3-0.7 units/ml Monitor platelets by anticoagulation protocol: Yes   Plan:  2/24 PM ADDENDUM: Order and consult was discontinued by provider briefly, then reactivated. ISO recent bolus, will only resume infusion at prior rate and check first heparin level in 8hrs. **original plan below for reference.  Give 4800 units bolus x 1 Start heparin infusion at 1400 units/hr Check anti-Xa level in 8 hours and daily while on heparin Continue to monitor H&H and platelets  Lorna Dibble, PharmD, [facility] Clinical Pharmacist 12/28/2021 9:32 PM

## 2021-12-29 ENCOUNTER — Observation Stay: Payer: Medicare Other

## 2021-12-29 ENCOUNTER — Observation Stay: Admit: 2021-12-29 | Payer: Medicare Other

## 2021-12-29 DIAGNOSIS — Z6841 Body Mass Index (BMI) 40.0 and over, adult: Secondary | ICD-10-CM | POA: Diagnosis not present

## 2021-12-29 DIAGNOSIS — G894 Chronic pain syndrome: Secondary | ICD-10-CM | POA: Diagnosis present

## 2021-12-29 DIAGNOSIS — Z1611 Resistance to penicillins: Secondary | ICD-10-CM | POA: Diagnosis present

## 2021-12-29 DIAGNOSIS — I5032 Chronic diastolic (congestive) heart failure: Secondary | ICD-10-CM | POA: Diagnosis present

## 2021-12-29 DIAGNOSIS — N390 Urinary tract infection, site not specified: Secondary | ICD-10-CM | POA: Diagnosis present

## 2021-12-29 DIAGNOSIS — K591 Functional diarrhea: Secondary | ICD-10-CM | POA: Diagnosis present

## 2021-12-29 DIAGNOSIS — R079 Chest pain, unspecified: Secondary | ICD-10-CM | POA: Diagnosis not present

## 2021-12-29 DIAGNOSIS — B951 Streptococcus, group B, as the cause of diseases classified elsewhere: Secondary | ICD-10-CM | POA: Diagnosis present

## 2021-12-29 DIAGNOSIS — B9620 Unspecified Escherichia coli [E. coli] as the cause of diseases classified elsewhere: Secondary | ICD-10-CM | POA: Diagnosis present

## 2021-12-29 DIAGNOSIS — E039 Hypothyroidism, unspecified: Secondary | ICD-10-CM | POA: Diagnosis present

## 2021-12-29 DIAGNOSIS — R7989 Other specified abnormal findings of blood chemistry: Secondary | ICD-10-CM | POA: Diagnosis present

## 2021-12-29 DIAGNOSIS — R0789 Other chest pain: Secondary | ICD-10-CM | POA: Diagnosis present

## 2021-12-29 DIAGNOSIS — F32A Depression, unspecified: Secondary | ICD-10-CM | POA: Diagnosis present

## 2021-12-29 DIAGNOSIS — I130 Hypertensive heart and chronic kidney disease with heart failure and stage 1 through stage 4 chronic kidney disease, or unspecified chronic kidney disease: Secondary | ICD-10-CM | POA: Diagnosis present

## 2021-12-29 DIAGNOSIS — E875 Hyperkalemia: Secondary | ICD-10-CM | POA: Diagnosis present

## 2021-12-29 DIAGNOSIS — N179 Acute kidney failure, unspecified: Secondary | ICD-10-CM | POA: Diagnosis present

## 2021-12-29 DIAGNOSIS — I959 Hypotension, unspecified: Secondary | ICD-10-CM | POA: Diagnosis not present

## 2021-12-29 DIAGNOSIS — M1990 Unspecified osteoarthritis, unspecified site: Secondary | ICD-10-CM | POA: Diagnosis not present

## 2021-12-29 DIAGNOSIS — F411 Generalized anxiety disorder: Secondary | ICD-10-CM | POA: Diagnosis present

## 2021-12-29 DIAGNOSIS — E11649 Type 2 diabetes mellitus with hypoglycemia without coma: Secondary | ICD-10-CM | POA: Diagnosis not present

## 2021-12-29 DIAGNOSIS — Z20822 Contact with and (suspected) exposure to covid-19: Secondary | ICD-10-CM | POA: Diagnosis present

## 2021-12-29 DIAGNOSIS — E861 Hypovolemia: Secondary | ICD-10-CM | POA: Diagnosis not present

## 2021-12-29 DIAGNOSIS — E1165 Type 2 diabetes mellitus with hyperglycemia: Secondary | ICD-10-CM | POA: Diagnosis present

## 2021-12-29 DIAGNOSIS — E1140 Type 2 diabetes mellitus with diabetic neuropathy, unspecified: Secondary | ICD-10-CM | POA: Diagnosis present

## 2021-12-29 DIAGNOSIS — N1832 Chronic kidney disease, stage 3b: Secondary | ICD-10-CM | POA: Diagnosis present

## 2021-12-29 DIAGNOSIS — R000 Tachycardia, unspecified: Secondary | ICD-10-CM | POA: Diagnosis present

## 2021-12-29 DIAGNOSIS — E1122 Type 2 diabetes mellitus with diabetic chronic kidney disease: Secondary | ICD-10-CM | POA: Diagnosis present

## 2021-12-29 LAB — URINALYSIS, COMPLETE (UACMP) WITH MICROSCOPIC
Bilirubin Urine: NEGATIVE
Glucose, UA: NEGATIVE mg/dL
Ketones, ur: NEGATIVE mg/dL
Nitrite: NEGATIVE
Protein, ur: NEGATIVE mg/dL
Specific Gravity, Urine: 1.008 (ref 1.005–1.030)
pH: 5 (ref 5.0–8.0)

## 2021-12-29 LAB — TSH: TSH: 0.955 u[IU]/mL (ref 0.350–4.500)

## 2021-12-29 LAB — BASIC METABOLIC PANEL
Anion gap: 10 (ref 5–15)
BUN: 58 mg/dL — ABNORMAL HIGH (ref 8–23)
CO2: 25 mmol/L (ref 22–32)
Calcium: 9 mg/dL (ref 8.9–10.3)
Chloride: 101 mmol/L (ref 98–111)
Creatinine, Ser: 1.72 mg/dL — ABNORMAL HIGH (ref 0.44–1.00)
GFR, Estimated: 31 mL/min — ABNORMAL LOW (ref >60)
Glucose, Bld: 123 mg/dL — ABNORMAL HIGH (ref 70–99)
Potassium: 4.2 mmol/L (ref 3.5–5.1)
Sodium: 136 mmol/L (ref 135–145)

## 2021-12-29 LAB — VITAMIN B12: Vitamin B-12: 556 pg/mL (ref 180–914)

## 2021-12-29 LAB — CBG MONITORING, ED: Glucose-Capillary: 97 mg/dL (ref 70–99)

## 2021-12-29 LAB — HEPARIN LEVEL (UNFRACTIONATED): Heparin Unfractionated: 0.63 IU/mL (ref 0.30–0.70)

## 2021-12-29 LAB — GLUCOSE, CAPILLARY
Glucose-Capillary: 118 mg/dL — ABNORMAL HIGH (ref 70–99)
Glucose-Capillary: 184 mg/dL — ABNORMAL HIGH (ref 70–99)

## 2021-12-29 LAB — CBC
HCT: 34.8 % — ABNORMAL LOW (ref 36.0–46.0)
Hemoglobin: 10.7 g/dL — ABNORMAL LOW (ref 12.0–15.0)
MCH: 29.2 pg (ref 26.0–34.0)
MCHC: 30.7 g/dL (ref 30.0–36.0)
MCV: 95.1 fL (ref 80.0–100.0)
Platelets: 163 10*3/uL (ref 150–400)
RBC: 3.66 MIL/uL — ABNORMAL LOW (ref 3.87–5.11)
RDW: 15.5 % (ref 11.5–15.5)
WBC: 7.6 10*3/uL (ref 4.0–10.5)
nRBC: 0 % (ref 0.0–0.2)

## 2021-12-29 MED ORDER — METOPROLOL SUCCINATE ER 50 MG PO TB24
50.0000 mg | ORAL_TABLET | Freq: Every day | ORAL | Status: DC
Start: 2021-12-29 — End: 2022-01-01
  Administered 2021-12-29 – 2022-01-01 (×2): 50 mg via ORAL
  Filled 2021-12-29 (×3): qty 1

## 2021-12-29 MED ORDER — FUROSEMIDE 20 MG PO TABS
40.0000 mg | ORAL_TABLET | Freq: Every day | ORAL | Status: DC
  Administered 2021-12-30: 40 mg via ORAL
  Filled 2021-12-29: qty 1

## 2021-12-29 MED ORDER — MONTELUKAST SODIUM 10 MG PO TABS
10.0000 mg | ORAL_TABLET | Freq: Every day | ORAL | Status: DC
  Administered 2021-12-29 – 2021-12-31 (×3): 10 mg via ORAL
  Filled 2021-12-29 (×3): qty 1

## 2021-12-29 MED ORDER — OXYCODONE-ACETAMINOPHEN 5-325 MG PO TABS
1.0000 | ORAL_TABLET | Freq: Three times a day (TID) | ORAL | Status: DC | PRN
  Administered 2022-01-01: 1 via ORAL
  Filled 2021-12-29: qty 1

## 2021-12-29 MED ORDER — MEMANTINE HCL ER 28 MG PO CP24
28.0000 mg | ORAL_CAPSULE | Freq: Every day | ORAL | Status: DC
Start: 2021-12-29 — End: 2022-01-01
  Administered 2021-12-29 – 2022-01-01 (×4): 28 mg via ORAL
  Filled 2021-12-29 (×5): qty 1

## 2021-12-29 MED ORDER — ENOXAPARIN SODIUM 60 MG/0.6ML IJ SOSY
0.5000 mg/kg | PREFILLED_SYRINGE | INTRAMUSCULAR | Status: DC
  Administered 2021-12-29 – 2021-12-31 (×3): 57.5 mg via SUBCUTANEOUS
  Filled 2021-12-29: qty 0.6
  Filled 2021-12-29: qty 0.57
  Filled 2021-12-29 (×2): qty 0.6

## 2021-12-29 MED ORDER — ASPIRIN EC 81 MG PO TBEC
81.0000 mg | DELAYED_RELEASE_TABLET | Freq: Every day | ORAL | Status: DC
Start: 2021-12-29 — End: 2022-01-01
  Administered 2021-12-29 – 2022-01-01 (×4): 81 mg via ORAL
  Filled 2021-12-29 (×4): qty 1

## 2021-12-29 MED ORDER — TECHNETIUM TO 99M ALBUMIN AGGREGATED
4.0000 | Freq: Once | INTRAVENOUS | Status: AC | PRN
  Administered 2021-12-29: 4.2 via INTRAVENOUS

## 2021-12-29 MED ORDER — METFORMIN HCL 500 MG PO TABS: 1000.0000 mg | ORAL_TABLET | Freq: Two times a day (BID) | ORAL | Status: DC

## 2021-12-29 MED ORDER — OYSTER SHELL CALCIUM/D3 500-5 MG-MCG PO TABS
1.0000 | ORAL_TABLET | Freq: Two times a day (BID) | ORAL | Status: DC
  Administered 2021-12-29 – 2022-01-01 (×6): 1 via ORAL
  Filled 2021-12-29 (×6): qty 1

## 2021-12-29 MED ORDER — BUSPIRONE HCL 10 MG PO TABS
30.0000 mg | ORAL_TABLET | Freq: Two times a day (BID) | ORAL | Status: DC
  Administered 2021-12-29 – 2022-01-01 (×6): 30 mg via ORAL
  Filled 2021-12-29 (×6): qty 3

## 2021-12-29 MED ORDER — OXYCODONE-ACETAMINOPHEN 7.5-325 MG PO TABS: 1.0000 | ORAL_TABLET | Freq: Three times a day (TID) | ORAL | Status: DC | PRN

## 2021-12-29 MED ORDER — LACTATED RINGERS IV SOLN: INTRAVENOUS | Status: AC

## 2021-12-29 MED ORDER — CEFTRIAXONE SODIUM 2 G IJ SOLR
2.0000 g | INTRAMUSCULAR | Status: DC
  Administered 2021-12-29 – 2021-12-30 (×2): 2 g via INTRAVENOUS
  Filled 2021-12-29 (×3): qty 20

## 2021-12-29 MED ORDER — AMOXICILLIN-POT CLAVULANATE 875-125 MG PO TABS: 1.0000 | ORAL_TABLET | Freq: Two times a day (BID) | ORAL | Status: DC

## 2021-12-29 MED ORDER — TOPIRAMATE 100 MG PO TABS
200.0000 mg | ORAL_TABLET | Freq: Every day | ORAL | Status: DC
  Administered 2021-12-29 – 2021-12-31 (×3): 200 mg via ORAL
  Filled 2021-12-29 (×4): qty 2

## 2021-12-29 NOTE — Assessment & Plan Note (Signed)
Atypical and most likely musculoskeletal.  Troponin remain negative.  Echocardiogram ordered by admitting provider-pending. -Supportive care

## 2021-12-29 NOTE — TOC Progression Note (Signed)
Transition of Care [facility]) - Progression Note    Patient Details  Name: [name] MRN: [id number] Date of Birth: 07-03-1946  Transition of Care [facility]) CM/SW [place], [place] Phone Number: 12/29/2021, 10:12 AM  Clinical Narrative:     [facility] consult acknowledged. PT evaluation order may be placed if indicated. TOC will continue to follow.          Expected Discharge Plan and Services                                                 Social Determinants of Health (SDOH) Interventions    Readmission Risk Interventions Readmission Risk Prevention Plan 03/07/2020  Transportation Screening Complete  PCP or Specialist Appt within 3-5 Days Complete  HRI or [place] Complete  Social Work Consult for [place] Planning/Counseling Complete  Palliative Care Screening Not Applicable  Medication Review [employer]) Complete  Some recent data might be hidden

## 2021-12-29 NOTE — Assessment & Plan Note (Signed)
CBG within goal. On metformin, Trulicity and Actos at home along with NovoLog. -Continue with Semglee -Continue with SSI and mealtime coverage.

## 2021-12-29 NOTE — Assessment & Plan Note (Signed)
Estimated body mass index is 44.11 kg/m as calculated from the following:   Height as of this encounter: 5\' 3"  (1.6 m).   Weight as of this encounter: 112.9 kg.   This will complicate overall prognosis.

## 2021-12-29 NOTE — Assessment & Plan Note (Signed)
Patient appears anxious. -Continue home BuSpar

## 2021-12-29 NOTE — ED Notes (Signed)
Pt transported to Nuclear Medicine at this time.

## 2021-12-29 NOTE — Assessment & Plan Note (Signed)
No diarrhea today.  Mostly secondary to milk products, might be some lactose intolerance. -Avoid lactose and milk products. -Observe and supportive care

## 2021-12-29 NOTE — Assessment & Plan Note (Signed)
Patient do have some dysuria although stating that it is chronic.  Prior urinary culture with group B strep.  UA with leukocytosis and many bacteria. -Follow-up urine cultures -Ceftriaxone-we will de-escalate once cultures are available

## 2021-12-29 NOTE — Progress Notes (Signed)
Progress Note   Patient: [name] IFO:[id number] DOB: 01-Jun-1946 DOA: 12/28/2021     0 DOS: the patient was seen and examined on 12/29/2021   Brief hospital course: Taken from H&P.   Ms. Mandolin Falwell is a 76 year old female with history of obesity, insulin-dependent diabetes mellitus type 2, GERD, CKD3a, hypothyroid, depression, hyperlipidemia, depression, anxiety, neuropathy, who presents to the emergency department for chief concerns of chest pain and back pain. Most likely musculoskeletal as it increases with movement.  Troponin remain negative.  EKG without any acute changes.  D-dimer was elevated at 1.8, lower extremity venous Doppler was negative for DVT and VQ scan was negative for PE.  She did initially started on heparin infusion which was discontinued this morning.  UA with large leukocyte and bacteria.  Urine cultures pending. Some abdominal pain but could not specify any urinary symptoms stating that she always have increased urinary urgency and dysuria. Based on progressive weakness and prior urine culture of group B strep-started her on ceftriaxone while waiting for cultures.  She does have some urinary symptoms.  PT is recommending SNF.  Patient feels unsafe at home due to progressive weakness.  Echocardiogram was also ordered by admitting provider for her concern of lower extremity edema.  BNP within normal limit, at 8. -Pending   Assessment and Plan: * Chest pain- (present on admission) Atypical and most likely musculoskeletal.  Troponin remain negative.  Echocardiogram ordered by admitting provider-pending. -Supportive care  UTI (urinary tract infection) Patient do have some dysuria although stating that it is chronic.  Prior urinary culture with group B strep.  UA with leukocytosis and many bacteria. -Follow-up urine cultures -Ceftriaxone-we will de-escalate once cultures are available   Acute kidney injury superimposed on CKD ([place])- (present on  admission) AKI with CKD stage IIIb.  Some improvement in creatinine, she did received IV fluid and Lasix in ED. appears little dry. -Giving some gentle fluid. -Holding home Lasix and lisinopril for today. -Monitor renal function -Avoid nephrotoxins  Hyperkalemia- (present on admission) Resolved. -Continue to monitor  Hypothyroidism- (present on admission) TSH within normal limit. -Continue home Synthroid  Anxiety state- (present on admission) Patient appears anxious. -Continue home BuSpar  Functional diarrhea- (present on admission) No diarrhea today.  Mostly secondary to milk products, might be some lactose intolerance. -Avoid lactose and milk products. -Observe and supportive care  Morbid obesity with BMI of 45.0-49.9, adult ([place]) Estimated body mass index is 44.11 kg/m as calculated from the following:   Height as of this encounter: 5\' 3"  (1.6 m).   Weight as of this encounter: 112.9 kg.   This will complicate overall prognosis.  Insulin dependent type 2 diabetes mellitus (HCC) CBG within goal. On metformin, Trulicity and Actos at home along with NovoLog. -Continue with Semglee -Continue with SSI and mealtime coverage.  Elevated d-dimer- (present on admission) Very nonspecific.  Lower extremity venous Doppler was negative for DVT and VQ scan was negative for PE.  She was started on heparin infusion in ED.  Remained on room air. -Discontinue heparin infusion  Swelling of lower extremity- (present on admission) No significant pitting edema. Echocardiogram ordered-pending BNP within normal limit. -We will restart home Lasix from tomorrow  Weakness B12 within normal limit. PT and OT are recommending SNF. -TOC consult  Chronic pain syndrome- (present on admission) Patient was on Percocet at home. -Continue home dose of Percocet but decrease the frequency from every 4 to Q8   Subjective: Patient was feeling very anxious and weak.  Continued to have some back  pain which increased with movement.  Denies any shortness of breath.  Physical Exam: Vitals:   12/29/21 0700 12/29/21 0730 12/29/21 0900 12/29/21 1200  BP: 106/80 (!) 113/58 136/67 (!) 124/52  Pulse: 95 92 99 98  Resp: (!) 21 11 (!) 23 (!) 9  Temp:      SpO2: 96% 99% 100% 100%  Weight:      Height:       General.  Morbidly obese lady in no acute distress. Pulmonary.  Lungs clear bilaterally, normal respiratory effort. CV.  Regular rate and rhythm, no JVD, rub or murmur. Abdomen.  Soft, nontender, nondistended, BS positive. CNS.  Alert and oriented .  No focal neurologic deficit. Extremities.  No edema, no cyanosis, pulses intact and symmetrical. Psychiatry.  Judgment and insight appears normal.  Data Reviewed: Prior data, notes, labs and imaging reviewed  Family Communication:   Disposition: Status is: Inpatient Remains inpatient appropriate because: Severity of illness   Planned Discharge Destination: Skilled nursing facility  DVT prophylaxis.  Lovenox  Time spent: 50 minutes  This record has been created using Systems analyst. Errors have been sought and corrected,but may not always be located. Such creation errors do not reflect on the standard of care.  Author: Lorella Nimrod, MD 12/29/2021 3:18 PM  For on call review [email or web address].

## 2021-12-29 NOTE — Assessment & Plan Note (Signed)
Very nonspecific.  Lower extremity venous Doppler was negative for DVT and VQ scan was negative for PE.  She was started on heparin infusion in ED.  Remained on room air. -Discontinue heparin infusion

## 2021-12-29 NOTE — Assessment & Plan Note (Signed)
No significant pitting edema. Echocardiogram ordered-pending BNP within normal limit. -We will restart home Lasix from tomorrow

## 2021-12-29 NOTE — Assessment & Plan Note (Signed)
Patient was on Percocet at home. -Continue home dose of Percocet but decrease the frequency from every 4 to Q8

## 2021-12-29 NOTE — Hospital Course (Addendum)
Taken from H&P.   Ms. [name] is a 76 year old female with history of obesity, insulin-dependent diabetes mellitus type 2, GERD, CKD3a, hypothyroid, depression, hyperlipidemia, depression, anxiety, neuropathy, who presents to the emergency department for chief concerns of chest pain and back pain. Most likely musculoskeletal as it increases with movement.  Troponin remain negative.  EKG without any acute changes.  D-dimer was elevated at 1.8, lower extremity venous Doppler was negative for DVT and VQ scan was negative for PE.  She did initially started on heparin infusion which was discontinued this morning.  UA with large leukocyte and bacteria.  Urine cultures pending. Some abdominal pain but could not specify any urinary symptoms stating that she always have increased urinary urgency and dysuria. Based on progressive weakness and prior urine culture of group B strep-started her on ceftriaxone while waiting for cultures.  She does have some urinary symptoms.  PT is recommending SNF.  Patient feels unsafe at home due to progressive weakness.  Echocardiogram was also ordered by admitting provider for her concern of lower extremity edema.  BNP within normal limit, at 8.  2/26: Preliminary urine cultures with more than 100,000 colonies of gram-negative rods.  2/27: Urine cultures with E. coli, only resistant to ampicillin and Unasyn.  Antibiotics switched with cefazolin. Became hypotensive overnight, no chest pain or shortness of breath.  Blood pressure remained soft despite getting 2 L of IV fluid.  Holding Lasix and metoprolol, giving some midodrine. She was transferred to stepdown with a concern that she might need pressors, PCCM is aware. Currently blood pressure improving.  Repeat troponin remained negative.

## 2021-12-29 NOTE — Assessment & Plan Note (Signed)
B12 within normal limit. PT and OT are recommending SNF. -TOC consult

## 2021-12-29 NOTE — Assessment & Plan Note (Signed)
TSH within normal limit. °-Continue home Synthroid °

## 2021-12-29 NOTE — Assessment & Plan Note (Signed)
AKI with CKD stage IIIb.  Some improvement in creatinine, she did received IV fluid and Lasix in ED. appears little dry. -Giving some gentle fluid. -Holding home Lasix and lisinopril for today. -Monitor renal function -Avoid nephrotoxins

## 2021-12-29 NOTE — Progress Notes (Signed)
ANTICOAGULATION CONSULT NOTE -   Pharmacy Consult for Heparin drip Indication:  VTE treatment (Ddimer elevated and unable to do CTA d/ renal fxn)  No Known Allergies  Patient Measurements: Height: 5\' 3"  (160 cm) Weight: 112.9 kg (249 lb) IBW/kg (Calculated) : 52.4 Heparin Dosing Weight: 80 kg  Vital Signs: BP: 113/58 (02/25 0730) Pulse Rate: 92 (02/25 0730)  Labs: Recent Labs    12/28/21 1553 12/28/21 1713 12/28/21 1837 12/29/21 0700 12/29/21 0744  HGB 10.8*  --   --  10.7*  --   HCT 35.1*  --   --  34.8*  --   PLT 173  --   --  163  --   APTT  --  32  --   --   --   LABPROT  --  13.8  --   --   --   INR  --  1.1  --   --   --   HEPARINUNFRC  --   --   --   --  0.63  CREATININE 1.95*  --   --   --   --   TROPONINIHS 5  --  4  --   --      Estimated Creatinine Clearance: 30.6 mL/min (A) (by C-G formula based on SCr of 1.95 mg/dL (H)).   Medical History: Past Medical History:  Diagnosis Date   Amnesia    Anxiety    Arthritis    Back pain    CHF (congestive heart failure) (HCC)    Chronic kidney disease    Acute on chronic 10/2020   Chronic pain syndrome    Cystocele    Depression    Diabetes mellitus without complication (HCC)    Type II   Diabetes mellitus without complication (HCC)    GERD (gastroesophageal reflux disease)    History of blood transfusion    with knee placements   Hyperlipidemia    Hypertension    IBS (irritable bowel syndrome)    Obesity    Osteoporosis    Osteoporosis    Panic attacks    Seizure (HCC)    Shortness of breath dyspnea    Sleep apnea    not using now   Thyroid nodule     Medications:  Scheduled:   atorvastatin  40 mg Oral Daily   ferrous sulfate  325 mg Oral BID WC   gabapentin  1,200 mg Oral BID   insulin aspart  0-20 Units Subcutaneous TID WC   insulin aspart  0-5 Units Subcutaneous QHS   insulin glargine-yfgn  72 Units Subcutaneous QHS   levothyroxine  75 mcg Oral Q0600   lidocaine  2 patch Transdermal  Q24H   methocarbamol  750 mg Oral Q8H   Infusions:   sodium chloride     heparin 1,400 Units/hr (12/28/21 2136)    Assessment: 76 yo F to start Heparin drip for possible PE?Marland Kitchen Admit w/ CP. Ddimer elevation. Unable to get chest CT d/t renal fxn. Hgb 10.8  plt 173  INR 1.1  aPTT 32 ASA is only anticoag medication PTA per Med Rec  2/25 0744 HL=0.63   Therapeutic  Goal of Therapy:  Heparin level 0.3-0.7 units/ml Monitor platelets by anticoagulation protocol: Yes   Plan:  -Heparin level= 0.63, therapeutic -continue current heparin drip rate of 1400 units/hr -check confirmatory level in 8 hrs -f/u CBC per protocol  Chinita Greenland PharmD Clinical Pharmacist 12/29/2021

## 2021-12-29 NOTE — Evaluation (Signed)
Physical Therapy Evaluation Patient Details Name: [name] MRN: [id number] DOB: 13-Jul-1946 Today's Date: 12/29/2021  History of Present Illness  Pt is a 76 y/o F admitted on 12/28/21 after presenting to the ED with c/o chest & back pain. Pt also c/o having diarrhea 3x/day x 1 month. PMH: obesity, insulin dependent DM2, GERD, CKD3A, hypothyroid, depression, HLD, anxiety, neuropathy  Clinical Impression  Pt seen for PT evaluation with pt reporting she resides in an apartment with a roommate & her grandson stays with her. Pt endorses 1 fall recently & states she ambulates with rollator with PRN assistance from grandson. Pt also endorses "jerking" movements which PT observes during session & they do impact pt's balance. Pt is able to complete bed mobility with supervision & sit<>stand EOB with min assist with RW but unable to stand >15 seconds before reporting "dizzy & lightheaded" & requests to sit down. Pt extremely anxious with symptoms only worsening as session goes on; this negatively impacts functional mobility. PT educated pt on recommendation of STR upon d/c to maximize independence with functional mobility & reduce fall risk prior to return home.      Recommendations for follow up therapy are one component of a multi-disciplinary discharge planning process, led by the attending physician.  Recommendations may be updated based on patient status, additional functional criteria and insurance authorization.  Follow Up Recommendations Skilled nursing-short term rehab (<3 hours/day)    Assistance Recommended at Discharge Frequent or constant Supervision/Assistance  Patient can return home with the following  A lot of help with walking and/or transfers;Assistance with cooking/housework;Direct supervision/assist for financial management;Assist for transportation;Help with stairs or ramp for entrance;Direct supervision/assist for medications management    Equipment Recommendations None  recommended by PT  Recommendations for Other Services       Functional Status Assessment Patient has had a recent decline in their functional status and demonstrates the ability to make significant improvements in function in a reasonable and predictable amount of time.     Precautions / Restrictions Precautions Precautions: Fall Restrictions Weight Bearing Restrictions: No      Mobility  Bed Mobility Overal bed mobility: Needs Assistance Bed Mobility: Supine to Sit, Sit to Supine     Supine to sit: Supervision, HOB elevated Sit to supine: Supervision, HOB elevated        Transfers Overall transfer level: Needs assistance Equipment used: Rolling walker (2 wheels) Transfers: Sit to/from Stand Sit to Stand: Min assist, From elevated surface           General transfer comment: sit>stand from EOB with min assist    Ambulation/Gait                  Stairs            Wheelchair Mobility    Modified Rankin (Stroke Patients Only)       Balance Overall balance assessment: Needs assistance Sitting-balance support: Feet unsupported, Bilateral upper extremity supported Sitting balance-Leahy Scale: Fair Sitting balance - Comments: close supervision static sitting   Standing balance support: Bilateral upper extremity supported, During functional activity Standing balance-Leahy Scale: Fair Standing balance comment: min assist static standing with BUE support on RW                             Pertinent Vitals/Pain Pain Assessment Pain Assessment: No/denies pain    Home Living Family/patient expects to be discharged to:: Private residence Living Arrangements:  (grandson &  roommate) Available Help at Discharge: Family;Available PRN/intermittently Type of Home: Apartment Home Access: Level entry       Home Layout: One level Home Equipment: Rollator (4 wheels) Additional Comments: Pt reorts she lives with a roommate & her grandson also  sleeps on the floor at her house. Her grandson can provide assistance if she needs it but he is usually out holding a sign asking for donations.    Prior Function               Mobility Comments: Pt reports 1 fall recently, where she injured her head. Reports she ambulates with rollator with PRN assistance from grandson.       Hand Dominance        Extremity/Trunk Assessment   Upper Extremity Assessment Upper Extremity Assessment: Overall WFL for tasks assessed    Lower Extremity Assessment Lower Extremity Assessment: Generalized weakness (unable to perform BLE heel to shin 2/2 impaired coordination)       Communication   Communication: No difficulties  Cognition Arousal/Alertness: Awake/alert Behavior During Therapy: Anxious Overall Cognitive Status: Within Functional Limits for tasks assessed                                 General Comments: Pt extremely anxious throughout mobility, requiring cuing for deep breathing & relaxation. Pt internally distracted & becomes more anxious as session goes.        General Comments General comments (skin integrity, edema, etc.): While standing EOB pt reports feeling lightheaded & dizzy & requests to sit down, SpO2 drops to 79% on monitor but do not believe this is accurate 2/2 cold fingers & poor pleth waveform, with extra time, good waveform found & SPO2 >90%.    Exercises     Assessment/Plan    PT Assessment Patient needs continued PT services  PT Problem List Decreased strength;Decreased coordination;Decreased activity tolerance;Decreased knowledge of use of DME;Decreased range of motion;Decreased balance;Decreased safety awareness;Decreased mobility       PT Treatment Interventions DME instruction;Therapeutic exercise;Balance training;Gait training;Stair training;Neuromuscular re-education;Functional mobility training;Patient/family education;Therapeutic activities;Modalities;Manual techniques    PT Goals  (Current goals can be found in the Care Plan section)  Acute Rehab PT Goals Patient Stated Goal: get better PT Goal Formulation: With patient Time For Goal Achievement: 01/12/22 Potential to Achieve Goals: Fair    Frequency Min 2X/week     Co-evaluation               AM-PAC PT "6 Clicks" Mobility  Outcome Measure Help needed turning from your back to your side while in a flat bed without using bedrails?: None Help needed moving from lying on your back to sitting on the side of a flat bed without using bedrails?: A Little Help needed moving to and from a bed to a chair (including a wheelchair)?: A Little Help needed standing up from a chair using your arms (e.g., wheelchair or bedside chair)?: A Little Help needed to walk in hospital room?: A Lot Help needed climbing 3-5 steps with a railing? : Total 6 Click Score: 16    End of Session   Activity Tolerance:  (pt limited by anxiety) Patient left: in bed;with call bell/phone within reach Nurse Communication: Mobility status PT Visit Diagnosis: Unsteadiness on feet (R26.81);Difficulty in walking, not elsewhere classified (R26.2);Muscle weakness (generalized) (M62.81)    Time: 3382-5053 PT Time Calculation (min) (ACUTE ONLY): 10 min   Charges:   PT Evaluation $  PT Eval Moderate Complexity: 1 Mod          Lavone Nian, PT, DPT 12/29/21, 2:27 PM   Waunita Schooner 12/29/2021, 2:25 PM

## 2021-12-29 NOTE — Assessment & Plan Note (Signed)
Resolved.  Continue to monitor.

## 2021-12-29 NOTE — Progress Notes (Signed)
PHARMACIST - PHYSICIAN COMMUNICATION  CONCERNING:  Enoxaparin (Lovenox) for DVT Prophylaxis    RECOMMENDATION:  Filed Weights   12/28/21 1549  Weight: 112.9 kg (249 lb)    Body mass index is 44.11 kg/m.  Estimated Creatinine Clearance: 34.7 mL/min (A) (by C-G formula based on SCr of 1.72 mg/dL (H)).   Based on [place] patient is candidate for enoxaparin 0.5mg /kg TBW SQ every 24 hours based on BMI being >30.  DESCRIPTION: Pharmacy has adjusted enoxaparin dose per [facility] policy.  Patient is now receiving enoxaparin 0.5 mg/kg every 24 hours   Dallie Piles, PharmD Clinical Pharmacist  12/29/2021 3:24 PM

## 2021-12-29 NOTE — Progress Notes (Signed)
OT Cancellation Note  Patient Details Name: [name] MRN: [id number] DOB: May 17, 1946   Cancelled Treatment:    Reason Eval/Treat Not Completed: Patient at procedure or test/ unavailable Pt OTF at this time. Will f/u for OT evaluation at later date/time as able/pt available. Thank you.  Gerrianne Scale, [place], OTR/L ascom [phone] 12/29/21, 5:07 PM

## 2021-12-30 ENCOUNTER — Inpatient Hospital Stay
Admit: 2021-12-30 | Discharge: 2021-12-30 | Disposition: A | Discharge status: Home / Self Care | Payer: Medicare Other | Attending: Internal Medicine | Admitting: Internal Medicine

## 2021-12-30 DIAGNOSIS — M1990 Unspecified osteoarthritis, unspecified site: Secondary | ICD-10-CM | POA: Diagnosis not present

## 2021-12-30 DIAGNOSIS — R079 Chest pain, unspecified: Secondary | ICD-10-CM | POA: Diagnosis not present

## 2021-12-30 DIAGNOSIS — N179 Acute kidney failure, unspecified: Secondary | ICD-10-CM | POA: Diagnosis not present

## 2021-12-30 DIAGNOSIS — F411 Generalized anxiety disorder: Secondary | ICD-10-CM | POA: Diagnosis not present

## 2021-12-30 LAB — BASIC METABOLIC PANEL
Anion gap: 7 (ref 5–15)
BUN: 43 mg/dL — ABNORMAL HIGH (ref 8–23)
CO2: 25 mmol/L (ref 22–32)
Calcium: 9 mg/dL (ref 8.9–10.3)
Chloride: 107 mmol/L (ref 98–111)
Creatinine, Ser: 1.13 mg/dL — ABNORMAL HIGH (ref 0.44–1.00)
GFR, Estimated: 51 mL/min — ABNORMAL LOW (ref >60)
Glucose, Bld: 65 mg/dL — ABNORMAL LOW (ref 70–99)
Potassium: 3.8 mmol/L (ref 3.5–5.1)
Sodium: 139 mmol/L (ref 135–145)

## 2021-12-30 LAB — ECHOCARDIOGRAM COMPLETE
AR max vel: 2.84 cm2
AV Peak grad: 7.2 mmHg
Ao pk vel: 1.34 m/s
Area-P 1/2: 4.04 cm2
Calc EF: 65.3 %
Height: 63 in
S' Lateral: 3 cm
Single Plane A2C EF: 65.8 %
Single Plane A4C EF: 65.9 %
Weight: 3996.5 oz

## 2021-12-30 LAB — GLUCOSE, CAPILLARY
Glucose-Capillary: 61 mg/dL — ABNORMAL LOW (ref 70–99)
Glucose-Capillary: 70 mg/dL (ref 70–99)
Glucose-Capillary: 154 mg/dL — ABNORMAL HIGH (ref 70–99)
Glucose-Capillary: 205 mg/dL — ABNORMAL HIGH (ref 70–99)
Glucose-Capillary: 197 mg/dL — ABNORMAL HIGH (ref 70–99)

## 2021-12-30 LAB — CBC
HCT: 34.1 % — ABNORMAL LOW (ref 36.0–46.0)
Hemoglobin: 10.6 g/dL — ABNORMAL LOW (ref 12.0–15.0)
MCH: 29.4 pg (ref 26.0–34.0)
MCHC: 31.1 g/dL (ref 30.0–36.0)
MCV: 94.7 fL (ref 80.0–100.0)
Platelets: 156 10*3/uL (ref 150–400)
RBC: 3.6 MIL/uL — ABNORMAL LOW (ref 3.87–5.11)
RDW: 15.3 % (ref 11.5–15.5)
WBC: 7 10*3/uL (ref 4.0–10.5)
nRBC: 0 % (ref 0.0–0.2)

## 2021-12-30 MED ORDER — INSULIN GLARGINE-YFGN 100 UNIT/ML ~~LOC~~ SOLN
30.0000 [IU] | Freq: Two times a day (BID) | SUBCUTANEOUS | Status: DC
  Filled 2021-12-30 (×3): qty 0.3

## 2021-12-30 MED ORDER — PERFLUTREN LIPID MICROSPHERE
1.0000 mL | INTRAVENOUS | Status: AC | PRN
  Administered 2021-12-30: 2 mL via INTRAVENOUS
  Filled 2021-12-30: qty 10

## 2021-12-30 MED ORDER — INSULIN GLARGINE-YFGN 100 UNIT/ML ~~LOC~~ SOLN
25.0000 [IU] | Freq: Two times a day (BID) | SUBCUTANEOUS | Status: DC
  Administered 2021-12-30 – 2021-12-31 (×3): 25 [IU] via SUBCUTANEOUS
  Filled 2021-12-30 (×6): qty 0.25

## 2021-12-30 NOTE — NC FL2 (Signed)
[place] LEVEL OF CARE SCREENING TOOL     IDENTIFICATION  Patient Name: [name] Birthdate: 1946/05/01 Sex: female Admission Date (Current Location): 12/28/2021  [facility] and [place] Number:  Engineering geologist and Address:  [facility], [address], [place], [place] [address]      Provider Number: [id number]  Attending Physician Name and Address:  Lorella Nimrod, MD  Relative Name and Phone Number:  Modesta Messing (Daughter)   [phone] (Mobile)    Current Level of Care: Hospital Recommended Level of Care: [place] Prior Approval Number:    Date Approved/Denied:   PASRR Number: [id number]  Discharge Plan:      Current Diagnoses: Patient Active Problem List   Diagnosis Date Noted   Chest pain 12/28/2021   Functional diarrhea 12/28/2021   Insulin dependent type 2 diabetes mellitus ([place]) 12/28/2021   Elevated d-dimer 12/28/2021   Swelling of lower extremity 12/28/2021   Dysuria 12/28/2021   Weakness 12/28/2021   Normocytic anemia 05/11/2021   B12 deficiency 05/11/2021   Fracture 03/29/2021   Status post lumbar spinal fusion 02/22/2021   Anemia, blood loss    Hypothyroidism    AMS (altered mental status) 02/15/2021   AMS (altered mental status) 02/15/2021   Lumbar foraminal stenosis 02/09/2021   UTI (urinary tract infection) 09/12/2020   Acidosis 08/22/2020   Acute kidney injury superimposed on CKD ([place]) 03/06/2020   Hyperkalemia 77/41/2878   Acute metabolic encephalopathy 67/67/2094   Acute respiratory failure with hypoxia (HCC) 02/06/2020   OSA (obstructive sleep apnea) 02/06/2020   CPAP ventilation treatment not tolerated 02/06/2020   Morbid obesity with BMI of 45.0-49.9, adult ([place]) 02/06/2020   Lactic acidosis 02/06/2020   possible Sepsis ([place]) 02/06/2020   Polypharmacy 02/06/2020   CAD (coronary artery disease) 05/05/2018   Preop cardiovascular exam 04/08/2018   Microscopic hematuria  09/17/2016   Cystitis, chronic 09/17/2016   Adrenal cortical nodule (HCC) LEFT, small 09/17/2016   Closed fracture of metatarsal bone 07/22/2016   Plantar fasciitis, left 05/28/2016   Degenerative spondylolisthesis 01/02/2016   Tachycardia 01/02/2016   Type 2 diabetes mellitus with hyperlipidemia (HCC)    Essential hypertension    Cardiomyopathy ([place]) 10/06/2015   Heart palpitations 10/06/2015   SOB (shortness of breath) on exertion 10/06/2015   Hematuria, unspecified 01/25/2015   Incontinence of urine in female 01/25/2015   Non-toxic multinodular goiter 10/11/2014   Diabetes mellitus ([place]) 10/11/2014   Hyperlipidemia 10/11/2014   Depression 10/11/2014   Osteoporosis 10/11/2014   GERD (gastroesophageal reflux disease) 10/11/2014   Arthritis 10/11/2014   Cystocele 10/11/2014   Chronic pain syndrome 10/11/2014   Amnesia 10/11/2014   Back pain 10/11/2014   Anxiety state 04/10/2013   Major depressive disorder, recurrent episode ([place]) 04/10/2013   Convulsions ([place]) 05/29/2012    Orientation RESPIRATION BLADDER Height & Weight     Self, Time, Situation, Place  Normal Incontinent Weight: 249 lb 12.5 oz (113.3 kg) Height:  5\' 3"  (160 cm)  BEHAVIORAL SYMPTOMS/MOOD NEUROLOGICAL BOWEL NUTRITION STATUS        Diet (heart healthy/carb modified)  AMBULATORY STATUS COMMUNICATION OF NEEDS Skin   Limited Assist Verbally Normal                       Personal Care Assistance Level of Assistance  Bathing, Feeding, Dressing Bathing Assistance: Limited assistance Feeding assistance: Independent Dressing Assistance: Limited assistance     Functional Limitations Info  SPECIAL CARE FACTORS FREQUENCY  PT (By licensed PT), OT (By licensed OT)     PT Frequency: 5 times per week OT Frequency: 5 times per week            Contractures      Additional Factors Info  Code Status, Allergies Code Status Info: full Allergies Info: nka           Current  Medications (12/30/2021):  This is the current hospital active medication list Current Facility-Administered Medications  Medication Dose Route Frequency Provider Last Rate Last Admin   acetaminophen (TYLENOL) tablet 650 mg  650 mg Oral Q6H PRN Cox, Amy N, DO       Or   acetaminophen (TYLENOL) suppository 650 mg  650 mg Rectal Q6H PRN Cox, Amy N, DO       aspirin EC tablet 81 mg  81 mg Oral Daily Lorella Nimrod, MD   81 mg at 12/30/21 0850   atorvastatin (LIPITOR) tablet 40 mg  40 mg Oral Daily Cox, Amy N, DO   40 mg at 12/30/21 0850   busPIRone (BUSPAR) tablet 30 mg  30 mg Oral BID Lorella Nimrod, MD   30 mg at 12/30/21 0849   calcium-vitamin D (OSCAL WITH D) 500-5 MG-MCG per tablet 1 tablet  1 tablet Oral BID Lorella Nimrod, MD   1 tablet at 12/30/21 0850   cefTRIAXone (ROCEPHIN) 2 g in sodium chloride 0.9 % 100 mL IVPB  2 g Intravenous Q24H Lorella Nimrod, MD   Stopped at 12/29/21 2130   enoxaparin (LOVENOX) injection 57.5 mg  0.5 mg/kg Subcutaneous Q24H Dallie Piles, RPH   57.5 mg at 12/29/21 1740   ferrous sulfate tablet 325 mg  325 mg Oral BID WC Cox, Amy N, DO   325 mg at 12/30/21 0849   furosemide (LASIX) tablet 40 mg  40 mg Oral Daily Lorella Nimrod, MD   40 mg at 12/30/21 0850   gabapentin (NEURONTIN) tablet 1,200 mg  1,200 mg Oral BID Cox, Amy N, DO   1,200 mg at 12/30/21 0848   insulin aspart (novoLOG) injection 0-20 Units  0-20 Units Subcutaneous TID WC Cox, Amy N, DO   4 Units at 12/30/21 1250   insulin aspart (novoLOG) injection 0-5 Units  0-5 Units Subcutaneous QHS Cox, Amy N, DO   2 Units at 12/29/21 0202   insulin glargine-yfgn (SEMGLEE) injection 25 Units  25 Units Subcutaneous BID Lorella Nimrod, MD       levothyroxine (SYNTHROID) tablet 75 mcg  75 mcg Oral Q0600 Cox, Amy N, DO   75 mcg at 12/30/21 0555   lidocaine (LIDODERM) 5 % 2 patch  2 patch Transdermal Q24H Cox, Amy N, DO       memantine (NAMENDA XR) 24 hr capsule 28 mg  28 mg Oral Daily Lorella Nimrod, MD   28 mg at 12/30/21  0855   methocarbamol (ROBAXIN) tablet 750 mg  750 mg Oral Q8H Cox, Amy N, DO   750 mg at 12/30/21 0555   metoprolol succinate (TOPROL-XL) 24 hr tablet 50 mg  50 mg Oral Daily Lorella Nimrod, MD   50 mg at 12/29/21 1739   montelukast (SINGULAIR) tablet 10 mg  10 mg Oral QHS Lorella Nimrod, MD   10 mg at 12/29/21 2050   ondansetron (ZOFRAN) tablet 4 mg  4 mg Oral Q6H PRN Cox, Amy N, DO       Or   ondansetron (ZOFRAN) injection 4 mg  4 mg Intravenous  Q6H PRN Cox, Amy N, DO       oxyCODONE (Oxy IR/ROXICODONE) immediate release tablet 5 mg  5 mg Oral Q4H PRN Cox, Amy N, DO   5 mg at 12/30/21 1059   oxyCODONE-acetaminophen (PERCOCET/ROXICET) 5-325 MG per tablet 1-2 tablet  1-2 tablet Oral Q8H PRN Beers, Shanon Brow, RPH       pantoprazole (PROTONIX) EC tablet 40 mg  40 mg Oral Daily PRN Cox, Amy N, DO   40 mg at 12/29/21 1739   topiramate (TOPAMAX) tablet 200 mg  200 mg Oral QHS Lorella Nimrod, MD   200 mg at 12/29/21 2051   Facility-Administered Medications Ordered in Other Encounters  Medication Dose Route Frequency Provider Last Rate Last Admin   perflutren lipid microspheres (DEFINITY) IV suspension  1-10 mL Intravenous PRN Cox, Amy N, DO   2 mL at 12/30/21 1233     Discharge Medications: Please see discharge summary for a list of discharge medications.  Relevant Imaging Results:  Relevant Lab Results:   Additional Information SS #: [id number] [place], LCSW

## 2021-12-30 NOTE — Assessment & Plan Note (Signed)
No diarrhea today.  Mostly secondary to milk products, might be some lactose intolerance. -Avoid lactose and milk products. -Observe and supportive care

## 2021-12-30 NOTE — Assessment & Plan Note (Signed)
Patient do have some dysuria although stating that it is chronic.  Prior urinary culture with group B strep.  UA with leukocytosis and many bacteria. Preliminary urine cultures with more than 100,000 colonies of gram-negative rods. -Follow-up urine cultures -Ceftriaxone-we will de-escalate once cultures are available

## 2021-12-30 NOTE — Assessment & Plan Note (Signed)
AKI with CKD stage IIIb.  Creatinine improved to baseline. -Monitor renal function -Avoid nephrotoxins

## 2021-12-30 NOTE — Assessment & Plan Note (Signed)
Resolved.  Continue to monitor.

## 2021-12-30 NOTE — Assessment & Plan Note (Signed)
1 episode of hypoglycemia. On metformin, Trulicity and Actos at home along with NovoLog. -Decrease Semglee to 25 units twice daily instead of 30 -Continue with SSI and mealtime coverage. -Continue to monitor

## 2021-12-30 NOTE — Progress Notes (Signed)
*  PRELIMINARY RESULTS* Echocardiogram 2D Echocardiogram has been performed. Definity IV Contrast used on this study.  [name] 12/30/2021, 12:34 PM

## 2021-12-30 NOTE — Progress Notes (Signed)
MD notified of low blood sugar. Orders received.

## 2021-12-30 NOTE — Assessment & Plan Note (Signed)
Blood pressure within goal. -Keep holding lisinopril-we will restart if needed

## 2021-12-30 NOTE — Evaluation (Signed)
Occupational Therapy Evaluation Patient Details Name: [name] MRN: [id number] DOB: 1946-06-11 Today's Date: 12/30/2021   History of Present Illness Pt is a 76 y/o F admitted on 12/28/21 after presenting to the ED with c/o chest & back pain. Pt also c/o having diarrhea 3x/day x 1 month. PMH: obesity, insulin dependent DM2, GERD, CKD3A, hypothyroid, depression, HLD, anxiety, neuropathy   Clinical Impression   Pt seen for OT evaluation this date. Upon arrival to room, pt awake and seated upright in bed. Pt agreeable to OT eval/tx. At baseline, pt performs ADLs independently (sponge-bathing), walks short household distances with rollator receiving PRN assistance from grandson, and lives in a 1-level apartment with grandson and roommate. Pt endorses 4 falls within the past 6 months. Pt currently requires SUPERVISION for bed mobility (with HOB elevated), MIN GUARD for sit>stand transfers, MIN A for standing peri-care, and MIN A for standing hand hygiene. Following walk of 3ft with RW and MIN GUARD, pt was observed to have b/l knee buckling. Pt walked back to recliner with MIN GUARD. Once seated, pt reported that she was dizzy (BP 110/56); RN informed. Pt currently presents with decreased activity tolerance and strength compared to baseline, and would benefit from additional skilled OT services to maximize return to PLOF and minimize risk of future falls, injury, caregiver burden, and readmission. Upon discharge, recommend SNF.   Recommendations for follow up therapy are one component of a multi-disciplinary discharge planning process, led by the attending physician.  Recommendations may be updated based on patient status, additional functional criteria and insurance authorization.   Follow Up Recommendations  Skilled nursing-short term rehab (<3 hours/day)    Assistance Recommended at Discharge Intermittent Supervision/Assistance  Patient can return home with the following A little help with walking  and/or transfers;A little help with bathing/dressing/bathroom;Assistance with cooking/housework    Functional Status Assessment  Patient has had a recent decline in their functional status and demonstrates the ability to make significant improvements in function in a reasonable and predictable amount of time.  Equipment Recommendations  Other (comment) (defer to next venue of care)       Precautions / Restrictions Precautions Precautions: Fall Restrictions Weight Bearing Restrictions: No      Mobility Bed Mobility Overal bed mobility: Needs Assistance Bed Mobility: Supine to Sit     Supine to sit: Supervision, HOB elevated          Transfers Overall transfer level: Needs assistance Equipment used: Rolling walker (2 wheels) Transfers: Sit to/from Stand Sit to Stand: Min guard           General transfer comment: x2 bouts, sit>stand from EOB and recliner. Requires cues for safe hand placement with RW use      Balance Overall balance assessment: Needs assistance Sitting-balance support: No upper extremity supported, Feet unsupported Sitting balance-Leahy Scale: Fair Sitting balance - Comments: Requires MIN GUARD for during dynamic sitting balance   Standing balance support: During functional activity, Bilateral upper extremity supported Standing balance-Leahy Scale: Fair Standing balance comment: Requires MIN GUARD during static standing with BUE support on RW, however MIN A for steadying during bimanual grooming tasks                           ADL either performed or assessed with clinical judgement   ADL Overall ADL's : Needs assistance/impaired     Grooming: Wash/dry hands;Minimal assistance;Standing Grooming Details (indicate cue type and reason): Pt required MIN A for steading  while hand washing with washcloth in standing             Lower Body Dressing: Min guard;Sitting/lateral leans Lower Body Dressing Details (indicate cue type and  reason): to don/doff socks. Requires increased time/effort     Toileting- Clothing Manipulation and Hygiene: Minimal assistance;Sit to/from stand Toileting - Clothing Manipulation Details (indicate cue type and reason): Pt required MIN A for steading while performing peri-care with washcloth in standing             Vision Baseline Vision/History: 1 Wears glasses Ability to See in Adequate Light: 1 Impaired              Pertinent Vitals/Pain Pain Assessment Pain Assessment: No/denies pain        Extremity/Trunk Assessment Upper Extremity Assessment Upper Extremity Assessment: Overall WFL for tasks assessed   Lower Extremity Assessment Lower Extremity Assessment: Generalized weakness       Communication Communication Communication: No difficulties   Cognition Arousal/Alertness: Awake/alert Behavior During Therapy: WFL for tasks assessed/performed, Anxious Overall Cognitive Status: Within Functional Limits for tasks assessed                                 General Comments: Pt agreeable and motivated to participate in therapy however anxious and endorses significant fear of falling     General Comments  Following sit>stand transfer, pt endorsed feeling dizzy. Once seated, vitals obtained (BP 110/56)    Exercises Other Exercises Other Exercises: pt educated on importance of OOB mobility while admitted to prevent futher deconditioning. Pt verbalized understanding and willingness to transfer to [facility] with RN as much as possible instead of using [facility] expects to be discharged to:: Private residence Living Arrangements: Non-relatives/Friends;Other relatives ("roommate" and grandson) Available Help at Discharge: Family;Available PRN/intermittently Type of Home: Apartment Home Access: Level entry     Home Layout: One level     Bathroom Shower/Tub: Sponge bathes at baseline         Home Equipment: Rollator (4  wheels)          Prior Functioning/Environment Prior Level of Function : Needs assist             Mobility Comments: Pt reports she ambulates with rollator for short household distances with PRN assistance from grandson. Endorses 4 falls within the past 6 months ADLs Comments: Pt reports she is independent with ADLs (spongebathes at baseline)        OT Problem List: Decreased strength;Decreased activity tolerance;Impaired balance (sitting and/or standing)      OT Treatment/Interventions: Self-care/ADL training;Therapeutic exercise;Energy conservation;DME and/or AE instruction;Therapeutic activities;Patient/family education;Balance training    OT Goals(Current goals can be found in the care plan section) Acute Rehab OT Goals Patient Stated Goal: to return home when stronger OT Goal Formulation: With patient Time For Goal Achievement: 01/13/22 Potential to Achieve Goals: Good ADL Goals Pt Will Perform Grooming: with supervision;standing (while standing for at least 5 mins) Pt Will Perform Lower Body Bathing: with supervision;with set-up;sit to/from stand Pt Will Transfer to Toilet: with supervision;ambulating;bedside commode  OT Frequency: Min 2X/week       AM-PAC OT "6 Clicks" Daily Activity     Outcome Measure Help from another person eating meals?: None Help from another person taking care of personal grooming?: A Little Help from another person toileting, which includes using toliet, bedpan, or urinal?: A Little Help  from another person bathing (including washing, rinsing, drying)?: A Lot Help from another person to put on and taking off regular upper body clothing?: None Help from another person to put on and taking off regular lower body clothing?: A Little 6 Click Score: 19   End of Session Equipment Utilized During Treatment: Rolling walker (2 wheels) Nurse Communication: Mobility status  Activity Tolerance: Patient tolerated treatment well Patient left: in  chair;with call bell/phone within reach;with chair alarm set  OT Visit Diagnosis: Unsteadiness on feet (R26.81);Muscle weakness (generalized) (M62.81)                Time: 5183-3582 OT Time Calculation (min): 23 min Charges:  OT General Charges $OT Visit: 1 Visit OT Evaluation $OT Eval Moderate Complexity: 1 Mod OT Treatments $Self Care/Home Management : 8-22 mins  Fredirick Maudlin, OTR/L [place]

## 2021-12-30 NOTE — Progress Notes (Signed)
Progress Note   Patient: [name] PXT:[id number] DOB: September 20, 1946 DOA: 12/28/2021     1 DOS: the patient was seen and examined on 12/30/2021   Brief hospital course: Taken from H&P.   [name] is a 76 year old female with history of obesity, insulin-dependent diabetes mellitus type 2, GERD, CKD3a, hypothyroid, depression, hyperlipidemia, depression, anxiety, neuropathy, who presents to the emergency department for chief concerns of chest pain and back pain. Most likely musculoskeletal as it increases with movement.  Troponin remain negative.  EKG without any acute changes.  D-dimer was elevated at 1.8, lower extremity venous Doppler was negative for DVT and VQ scan was negative for PE.  She did initially started on heparin infusion which was discontinued this morning.  UA with large leukocyte and bacteria.  Urine cultures pending. Some abdominal pain but could not specify any urinary symptoms stating that she always have increased urinary urgency and dysuria. Based on progressive weakness and prior urine culture of group B strep-started her on ceftriaxone while waiting for cultures.  She does have some urinary symptoms.  PT is recommending SNF.  Patient feels unsafe at home due to progressive weakness.  Echocardiogram was also ordered by admitting provider for her concern of lower extremity edema.  BNP within normal limit, at 8.  2/26: Preliminary urine cultures with more than 100,000 colonies of gram-negative rods.   Assessment and Plan: * Chest pain- (present on admission) Atypical and most likely musculoskeletal.  Troponin remain negative.  Echocardiogram ordered by admitting provider-pending. -Supportive care  UTI (urinary tract infection) Patient do have some dysuria although stating that it is chronic.  Prior urinary culture with group B strep.  UA with leukocytosis and many bacteria. Preliminary urine cultures with more than 100,000 colonies of gram-negative  rods. -Follow-up urine cultures -Ceftriaxone-we will de-escalate once cultures are available   Acute kidney injury superimposed on CKD ([place])- (present on admission) AKI with CKD stage IIIb.  Creatinine improved to baseline. -Monitor renal function -Avoid nephrotoxins  Hyperkalemia- (present on admission) Resolved. -Continue to monitor  Hypothyroidism- (present on admission) TSH within normal limit. -Continue home Synthroid  Anxiety state- (present on admission) Patient appears anxious. -Continue home BuSpar  Functional diarrhea- (present on admission) No diarrhea today.  Mostly secondary to milk products, might be some lactose intolerance. -Avoid lactose and milk products. -Observe and supportive care  Morbid obesity with BMI of 45.0-49.9, adult ([place]) Estimated body mass index is 44.11 kg/m as calculated from the following:   Height as of this encounter: 5\' 3"  (1.6 m).   Weight as of this encounter: 112.9 kg.   This will complicate overall prognosis.  Insulin dependent type 2 diabetes mellitus (HCC) 1 episode of hypoglycemia. On metformin, Trulicity and Actos at home along with NovoLog. -Decrease Semglee to 25 units twice daily instead of 30 -Continue with SSI and mealtime coverage. -Continue to monitor  Elevated d-dimer- (present on admission) Very nonspecific.  Lower extremity venous Doppler was negative for DVT and VQ scan was negative for PE.  She was started on heparin infusion in ED.  Remained on room air. -Discontinue heparin infusion  Swelling of lower extremity- (present on admission) No significant pitting edema. Echocardiogram ordered-pending BNP within normal limit. -We will restart home Lasix from tomorrow  Weakness B12 within normal limit. PT and OT are recommending SNF. -TOC consult  Chronic pain syndrome- (present on admission) Patient was on Percocet at home. -Continue home dose of Percocet but decrease the frequency from every 4 to  Q8  Essential hypertension- (present on admission) Blood pressure within goal. -Keep holding lisinopril-we will restart if needed   Subjective: Patient was sitting comfortably in chair when seen today.  Denies any nausea, vomiting or diarrhea.  Physical Exam: Vitals:   12/30/21 0400 12/30/21 0500 12/30/21 0756 12/30/21 1227  BP: (!) 101/50  (!) 105/50 112/60  Pulse: 73  71 74  Resp: 16  18 18   Temp: 98.3 F (36.8 C)  97.9 F (36.6 C) 97.9 F (36.6 C)  TempSrc: Oral   Oral  SpO2: 98%  96% 96%  Weight:  113.3 kg    Height:       General.  Morbidly obese elderly lady, in no acute distress. Pulmonary.  Lungs clear bilaterally, normal respiratory effort. CV.  Regular rate and rhythm, no JVD, rub or murmur. Abdomen.  Soft, nontender, nondistended, BS positive. CNS.  Alert and oriented .  No focal neurologic deficit. Extremities.  No edema, no cyanosis, pulses intact and symmetrical. Psychiatry.  Judgment and insight appears normal.  Data Reviewed: I reviewed prior notes and labs.  Family Communication: Discussed with patient  Disposition: Status is: Inpatient Remains inpatient appropriate because: Severity of illness.  Pending SNF placement   Planned Discharge Destination: Skilled nursing facility  DVT prophylaxis.  Lovenox  Time spent: 40 minutes  This record has been created using Systems analyst. Errors have been sought and corrected,but may not always be located. Such creation errors do not reflect on the standard of care.  Author: Lorella Nimrod, MD 12/30/2021 1:59 PM  For on call review [email or web address].

## 2021-12-30 NOTE — TOC Progression Note (Signed)
Transition of Care [facility]) - Progression Note    Patient Details  Name: CORTEZ STEELMAN MRN: [id number] Date of Birth: 12/21/1945  Transition of Care [facility]) CM/SW [place], LCSW Phone Number: 12/30/2021, 1:54 PM  Clinical Narrative:   Per chart, patient presents with poor judgement and memory impairment. Spoke with daughter [name] via phone regarding DC planning. Patient lives with her grandson who provides transportation. PCP is Dr. Magnus Ivan. Pharmacy is [facility] in [place].  Patient has a rollator and wheelchair at home. [name] reported in the past patient has refused HH and SNF, but is agreeable to SNF this time. No SNF preference. Would like to look at reviews of SNFs that make bed offers.  CSW started SNF work up.          Expected Discharge Plan and Services                                                 Social Determinants of Health (SDOH) Interventions    Readmission Risk Interventions Readmission Risk Prevention Plan 03/07/2020  Transportation Screening Complete  PCP or Specialist Appt within 3-5 Days Complete  HRI or [place] Complete  Social Work Consult for [place] Planning/Counseling Complete  Palliative Care Screening Not Applicable  Medication Review Press photographer) Complete  Some recent data might be hidden

## 2021-12-30 NOTE — Assessment & Plan Note (Signed)
B12 within normal limit. PT and OT are recommending SNF. -TOC consult

## 2021-12-31 ENCOUNTER — Inpatient Hospital Stay: Payer: Medicare Other | Attending: Oncology

## 2021-12-31 DIAGNOSIS — R079 Chest pain, unspecified: Secondary | ICD-10-CM | POA: Diagnosis not present

## 2021-12-31 DIAGNOSIS — F411 Generalized anxiety disorder: Secondary | ICD-10-CM | POA: Diagnosis not present

## 2021-12-31 DIAGNOSIS — Z6841 Body Mass Index (BMI) 40.0 and over, adult: Secondary | ICD-10-CM | POA: Diagnosis not present

## 2021-12-31 DIAGNOSIS — N179 Acute kidney failure, unspecified: Secondary | ICD-10-CM | POA: Diagnosis not present

## 2021-12-31 DIAGNOSIS — E875 Hyperkalemia: Secondary | ICD-10-CM | POA: Diagnosis not present

## 2021-12-31 DIAGNOSIS — E861 Hypovolemia: Secondary | ICD-10-CM | POA: Diagnosis not present

## 2021-12-31 DIAGNOSIS — I959 Hypotension, unspecified: Secondary | ICD-10-CM

## 2021-12-31 LAB — TROPONIN I (HIGH SENSITIVITY)
Troponin I (High Sensitivity): 10 ng/L (ref <18)
Troponin I (High Sensitivity): 10 ng/L (ref <18)

## 2021-12-31 LAB — GLUCOSE, CAPILLARY
Glucose-Capillary: 235 mg/dL — ABNORMAL HIGH (ref 70–99)
Glucose-Capillary: 242 mg/dL — ABNORMAL HIGH (ref 70–99)
Glucose-Capillary: 164 mg/dL — ABNORMAL HIGH (ref 70–99)
Glucose-Capillary: 159 mg/dL — ABNORMAL HIGH (ref 70–99)
Glucose-Capillary: 154 mg/dL — ABNORMAL HIGH (ref 70–99)

## 2021-12-31 LAB — LACTIC ACID, PLASMA: Lactic Acid, Venous: 1.5 mmol/L (ref 0.5–1.9)

## 2021-12-31 LAB — BASIC METABOLIC PANEL
Anion gap: 9 (ref 5–15)
BUN: 49 mg/dL — ABNORMAL HIGH (ref 8–23)
CO2: 22 mmol/L (ref 22–32)
Calcium: 8.6 mg/dL — ABNORMAL LOW (ref 8.9–10.3)
Chloride: 104 mmol/L (ref 98–111)
Creatinine, Ser: 1.77 mg/dL — ABNORMAL HIGH (ref 0.44–1.00)
GFR, Estimated: 30 mL/min — ABNORMAL LOW (ref >60)
Glucose, Bld: 233 mg/dL — ABNORMAL HIGH (ref 70–99)
Potassium: 4.6 mmol/L (ref 3.5–5.1)
Sodium: 135 mmol/L (ref 135–145)

## 2021-12-31 LAB — URINE CULTURE: Culture: >=100000 — AB

## 2021-12-31 LAB — PROCALCITONIN: Procalcitonin: <0.1 ng/mL

## 2021-12-31 LAB — ALBUMIN: Albumin: 2.9 g/dL — ABNORMAL LOW (ref 3.5–5.0)

## 2021-12-31 MED ORDER — MIDODRINE HCL 5 MG PO TABS
10.0000 mg | ORAL_TABLET | Freq: Once | ORAL | Status: AC
  Administered 2021-12-31: 10 mg via ORAL
  Filled 2021-12-31: qty 2

## 2021-12-31 MED ORDER — MIDODRINE HCL 5 MG PO TABS
5.0000 mg | ORAL_TABLET | Freq: Once | ORAL | Status: AC
  Administered 2021-12-31: 5 mg via ORAL
  Filled 2021-12-31: qty 1

## 2021-12-31 MED ORDER — CHLORHEXIDINE GLUCONATE CLOTH 2 % EX PADS
6.0000 | MEDICATED_PAD | Freq: Every day | CUTANEOUS | Status: DC
  Administered 2021-12-31 – 2022-01-01 (×2): 6 via TOPICAL

## 2021-12-31 MED ORDER — NOREPINEPHRINE 4 MG/250ML-% IV SOLN: 0.0000 ug/min | INTRAVENOUS | Status: DC

## 2021-12-31 MED ORDER — SODIUM CHLORIDE 0.9 % IV BOLUS
1000.0000 mL | Freq: Once | INTRAVENOUS | Status: AC
  Administered 2021-12-31: 1000 mL via INTRAVENOUS

## 2021-12-31 MED ORDER — CEFAZOLIN SODIUM-DEXTROSE 1-4 GM/50ML-% IV SOLN
1.0000 g | Freq: Three times a day (TID) | INTRAVENOUS | Status: DC
Start: 2021-12-31 — End: 2022-01-01
  Administered 2021-12-31 – 2022-01-01 (×3): 1 g via INTRAVENOUS
  Filled 2021-12-31 (×7): qty 50

## 2021-12-31 MED ORDER — MIDODRINE HCL 5 MG PO TABS
10.0000 mg | ORAL_TABLET | Freq: Three times a day (TID) | ORAL | Status: DC
  Administered 2021-12-31 – 2022-01-01 (×3): 10 mg via ORAL
  Filled 2021-12-31 (×3): qty 2

## 2021-12-31 MED ORDER — LACTATED RINGERS IV BOLUS
1000.0000 mL | Freq: Once | INTRAVENOUS | Status: AC
  Administered 2021-12-31: 1000 mL via INTRAVENOUS

## 2021-12-31 NOTE — Assessment & Plan Note (Signed)
Patient do have some dysuria although stating that it is chronic.  Prior urinary culture with group B strep.  UA with leukocytosis and many bacteria. Urine cultures with E. coli, only resistant to ampicillin and Unasyn. -Switch antibiotics with cefazolin

## 2021-12-31 NOTE — Evaluation (Signed)
Occupational Therapy Re-evaluation Patient Details Name: [name] MRN: [id number] DOB: June 26, 1946 Today's Date: 12/31/2021   History of Present Illness Pt is a 76 y/o F admitted on 12/28/21 after presenting to the ED with c/o chest & back pain. Pt also c/o having diarrhea 3x/day x 1 month. PMH: obesity, insulin dependent DM2, GERD, CKD3A, hypothyroid, depression, HLD, anxiety, neuropathy   Clinical Impression   Pt seen for OT re-eval on this date following transfer to ICU (with RN approving for tx, however requesting RUE remain straight while receiving bolus). Upon arrival to room, pt asleep in bed however easily awoken and agreeable to OT tx. Pt required only SUPERVISION for bed mobility. Once seated EOB, pt reported dizziness and was (+) for orthostatic hypotension (BP supine 115/57, Seated EOB 95/55). Pt also noted to have BLE jerking and was only able to sit EOB for ~2 mins while completing seated LE therapy exercises before reporting back pain and requesting to return to supine (anticipate pt able to perform seated grooming tasks at EOB with only SUPERVISION/SET-UP). Pt attempted x1 sit>stand transfer, however was unable to fully clear hips from bed following MOD A from this author. Pt returned to supine, requiring SUPERVISION only (with pt able to pull self toward [facility] with [name] pulling on bedrail with bed in Trendelenburg position). Pt left supine in bed, in no acute distress (BP 111/61), and with all needs within reach. Pt continues to benefit from skilled OT services to maximize return to PLOF and minimize risk of future falls, injury, caregiver burden, and readmission. Goals from initial eval remain appropriate. Discharge recommendation remains appropriate.   Recommendations for follow up therapy are one component of a multi-disciplinary discharge planning process, led by the attending physician.  Recommendations may be updated based on patient status, additional functional criteria and  insurance authorization.   Follow Up Recommendations  Skilled nursing-short term rehab (<3 hours/day)    Assistance Recommended at Discharge Intermittent Supervision/Assistance  Patient can return home with the following A little help with walking and/or transfers;A little help with bathing/dressing/bathroom;Assistance with cooking/housework    Functional Status Assessment  Patient has had a recent decline in their functional status and demonstrates the ability to make significant improvements in function in a reasonable and predictable amount of time.  Equipment Recommendations  Other (comment) (defer to next venue of care)       Precautions / Restrictions Precautions Precautions: Fall Restrictions Weight Bearing Restrictions: No      Mobility Bed Mobility Overal bed mobility: Needs Assistance Bed Mobility: Supine to Sit, Sit to Supine     Supine to sit: Supervision, HOB elevated Sit to supine: Supervision, HOB elevated        Transfers Overall transfer level: Needs assistance Equipment used: Rolling walker (2 wheels) Transfers: Sit to/from Stand Sit to Stand: Mod assist           General transfer comment: Attempted once, however unable to fully clear hips from bed following MOD A. Pt refused to attempt again      Balance Overall balance assessment: Needs assistance Sitting-balance support: No upper extremity supported, Feet unsupported Sitting balance-Leahy Scale: Fair Sitting balance - Comments: Requires MIN GUARD for during static sitting balance at EOB   Standing balance support: Single extremity supported, During functional activity Standing balance-Leahy Scale: Poor Standing balance comment: Unable to maintain static standing balance this date  ADL either performed or assessed with clinical judgement   ADL       Grooming: Wash/dry face;Supervision/safety;Sitting                                         Pertinent Vitals/Pain Pain Assessment Pain Assessment: Faces Faces Pain Scale: Hurts even more Pain Location: back Pain Descriptors / Indicators: Aching Pain Intervention(s): Limited activity within patient's tolerance, Monitored during session, Repositioned              Cognition Arousal/Alertness: Awake/alert Behavior During Therapy: WFL for tasks assessed/performed Overall Cognitive Status: Within Functional Limits for tasks assessed                                 General Comments: Pt appearing very fatigued this date however agreeable to OT tx     General Comments  BP obtained during bed mobility: Supine 115/57, Seated EOB 95/55, supine 111/61          OT Problem List: Decreased strength;Decreased activity tolerance;Impaired balance (sitting and/or standing);Cardiopulmonary status limiting activity      OT Treatment/Interventions: Self-care/ADL training;Therapeutic exercise;Energy conservation;DME and/or AE instruction;Therapeutic activities;Patient/family education;Balance training    OT Goals(Current goals can be found in the care plan section) Acute Rehab OT Goals Patient Stated Goal: to return home when stronger OT Goal Formulation: With patient Time For Goal Achievement: 01/13/22 Potential to Achieve Goals: Good  OT Frequency: Min 2X/week       AM-PAC OT "6 Clicks" Daily Activity     Outcome Measure Help from another person eating meals?: None Help from another person taking care of personal grooming?: A Little Help from another person toileting, which includes using toliet, bedpan, or urinal?: A Lot Help from another person bathing (including washing, rinsing, drying)?: A Lot Help from another person to put on and taking off regular upper body clothing?: A Little Help from another person to put on and taking off regular lower body clothing?: A Lot 6 Click Score: 16   End of Session Nurse Communication: Mobility status  Activity  Tolerance: Patient limited by fatigue Patient left: in bed;with call bell/phone within reach;with bed alarm set  OT Visit Diagnosis: Unsteadiness on feet (R26.81);Muscle weakness (generalized) (M62.81)                Time: 1031-5945 OT Time Calculation (min): 18 min Charges:  OT General Charges $OT Visit: 1 Visit OT Evaluation $OT Re-eval: 1 Re-eval OT Treatments $Therapeutic Activity: 8-22 mins  Fredirick Maudlin, OTR/L [place]

## 2021-12-31 NOTE — Assessment & Plan Note (Signed)
Resolved.  Continue to monitor.

## 2021-12-31 NOTE — Assessment & Plan Note (Signed)
Became hypotensive overnight.  Just feeling overall weak, no chest pain or shortness of breath.  Troponin remained negative. Patient received 2 L of IV bolus and was transferred to stepdown for concern of as she might need pressors-PCCM aware. Blood pressure was on softer side this morning. -Midodrine 10 mg 3 times a day -Hold Lasix and metoprolol -Blood cultures

## 2021-12-31 NOTE — TOC Progression Note (Signed)
Transition of Care [facility]) - Progression Note    Patient Details  Name: [name] MRN: [id number] Date of Birth: August 29, 1946  [facility]) CM/SW Contact  Shelbie Hutching, RN Phone Number: 12/31/2021, 12:38 PM  Clinical Narrative:    RNCM met with patient at the bedside.  Present patient with bed offers from [facility], [place], and [place].  Patient will discuss bed offers with her family and choose one.  RNCM will follow up tomorrow.    Expected Discharge Plan: Skilled Nursing Facility Barriers to Discharge: Continued Medical Work up  Expected Discharge Plan and Services Expected Discharge Plan: [place]   Discharge Planning Services: CM Consult Post Acute Care Choice: [place] Living arrangements for the past 2 months: Apartment                 DME Arranged: N/A DME Agency: NA       HH Arranged: NA HH Agency: NA         Social Determinants of Health (SDOH) Interventions    Readmission Risk Interventions Readmission Risk Prevention Plan 03/07/2020  Transportation Screening Complete  PCP or Specialist Appt within 3-5 Days Complete  HRI or Home Care Consult Complete  Social Work Consult for [place] Planning/Counseling Complete  Palliative Care Screening Not Applicable  Medication Review Press photographer) Complete  Some recent data might be hidden

## 2021-12-31 NOTE — Assessment & Plan Note (Signed)
Blood pressure little soft. -Keep holding lisinopril-we will restart if needed -Hold Lasix and metoprolol

## 2021-12-31 NOTE — Progress Notes (Signed)
°   12/31/21 0534  Assess: MEWS Score  BP (!) 79/56  Pulse Rate (!) 103  Resp 12  Assess: MEWS Score  MEWS Temp 0  MEWS Systolic 2  MEWS Pulse 1  MEWS RR 1  MEWS LOC 0  MEWS Score 4  MEWS Score Color Red  Assess: if the MEWS score is Yellow or Red  Were vital signs taken at a resting state? Yes  Focused Assessment No change from prior assessment  Does the patient meet 2 or more of the SIRS criteria? No  MEWS guidelines implemented *See Row Information* No, vital signs rechecked  Treat  MEWS Interventions Other (Comment) Valetta Fuller, NP notified, 1L bolus given)  Notify: Charge Nurse/RN  Name of Charge Nurse/RN Notified Britt Bolognese, RN  Date Charge Nurse/RN Notified 12/31/21  Time Charge Nurse/RN Notified 0535  Notify: Provider  Provider Name/Title Neomia Glass, NP  Date Provider Notified 12/31/21  Time Provider Notified [phone]  Notification Type Page  Notification Reason Other (Comment) (Hypotension)  Provider response See new orders (1L fluid bolus ordered)  Date of Provider Response 12/31/21  Time of Provider Response 0535  Assess: SIRS CRITERIA  SIRS Temperature  0  SIRS Pulse 1  SIRS Respirations  0  SIRS WBC 0  SIRS Score Sum  1

## 2021-12-31 NOTE — Progress Notes (Signed)
To whom it may concern:  The above named patient will require a short term nursing home stay- 30 days or less for rehabilitation and strengthening.  The plan is for return home.

## 2021-12-31 NOTE — Assessment & Plan Note (Signed)
Atypical and most likely musculoskeletal.  Troponin remain negative.  Echocardiogram with normal EF and grade 1 diastolic dysfunction.  No wall motion abnormality. -Supportive care

## 2021-12-31 NOTE — Progress Notes (Signed)
° °      CROSS COVER NOTE  NAME: [name] MRN: [id number] DOB : July 22, 1946   Secure chat received from RN reporting Hypotension BP 80/46. 1L Fluid bolus ordered.  Follow up BP post fluid is 79/56. An additional 1L of fluid and midodrine ordered. Patient has been afebrile tonight. Patient endorses lightheadedness and weakness. Patient discussed with PCCM in the event she requires pressors and upgraded to stepdown level of care.  Lactic, Procalcitonin, and Albumin pending.  Neomia Glass MHA, MSN, FNP-BC Nurse Practitioner [facility] Pager [phone]

## 2021-12-31 NOTE — Progress Notes (Signed)
PT Cancellation Note  Patient Details Name: [name] MRN: [id number] DOB: 1946-04-28   Cancelled Treatment:    Reason Eval/Treat Not Completed: Patient not medically ready Since PT evaluation pt was transferred from ED>floor>ICU but pt with continued PT orders. Attempted to see pt for PT tx but pt's BP in bed 76/36 mmHg MAP 49 & pt reporting feeling weak & unwell. Pt left in bed & nurse notified of ongoing low BP. Will f/u as able & as pt is medically appropriate for PT Intervention.  Lavone Nian, PT, DPT 12/31/21, 10:14 AM   Waunita Schooner 12/31/2021, 10:13 AM

## 2021-12-31 NOTE — Consult Note (Addendum)
NAME:  ROSHAWNDA PECORA, MRN:  [id number], DOB:  05-26-1946, LOS: 2 ADMISSION DATE:  12/28/2021   History of Present Illness:  76 year old female with history of obesity, insulin-dependent diabetes mellitus type 2, GERD, CKD3a, hypothyroid, depression, hyperlipidemia, depression, anxiety, neuropathy, who presents to the emergency department for chief concerns of chest pain and back pain.  Most likely musculoskeletal as it increases with movement.  Troponin remain negative.  EKG without any acute changes.   D-dimer was elevated at 1.8, lower extremity venous Doppler was negative for DVT and VQ scan was negative for PE.    She did initially started on heparin infusion which was discontinued this morning.   UA with large leukocyte and bacteria.   Some abdominal pain but could not specify any urinary symptoms stating that she always have increased urinary urgency and dysuria.  Based on progressive weakness and prior urine culture of group B strep-started her on ceftriaxone while waiting for cultures.  She does have some urinary symptoms.   PT is recommending SNF.  Patient feels unsafe at home due to progressive weakness.   Echocardiogram was also ordered by admitting provider for her concern of lower extremity edema.  BNP within normal limit, at 8.   2/26: Preliminary urine cultures with more than 100,000 colonies of gram-negative rods. 2/27 transferred to ICU for low BP, alert and awake    Pertinent  Medical History  +UTI Transfer to ICU  [facility] Events: Including procedures, antibiotic start and stop dates in addition to other pertinent events   2/26 admitted for weakness and UTI 2/27 transferred to ICU for low BP, alert and awake     Micro Data:  COVID NEG FLU NEG URINE CULTURES >=100,000 COLONIES/mL ESCHERICHIA COLI  60,000 COLONIES/mL STREPTOCOCCUS AGALACTIAE     Antimicrobials:   Antibiotics Given (last 72 hours)     Date/Time Action Medication Dose  Rate   12/29/21 2100 New Bag/Given   cefTRIAXone (ROCEPHIN) 2 g in sodium chloride 0.9 % 100 mL IVPB 2 g 200 mL/hr   12/30/21 1706 New Bag/Given   cefTRIAXone (ROCEPHIN) 2 g in sodium chloride 0.9 % 100 mL IVPB 2 g 200 mL/hr            Interim History / Subjective:  Alert and awake Following commands Started on IVF's And given midodrine Received bet blocker and lasix last 24 hrs Not toxic appearing No major complaints  Most Recent BP 112/70 Previously  had SBP's 70's No fevers No ABD symptoms        Objective   Blood pressure (!) 73/52, pulse (!) 107, temperature 98.8 F (37.1 C), temperature source Oral, resp. rate 13, height 5\' 3"  (1.6 m), weight 112 kg, SpO2 98 %.        Intake/Output Summary (Last 24 hours) at 12/31/2021 1310 Last data filed at 12/31/2021 0910 Gross per 24 hour  Intake 1402.61 ml  Output 1050 ml  Net 352.61 ml   Filed Weights   12/29/21 1701 12/30/21 0500 12/31/21 0354  Weight: 112.9 kg 113.3 kg 112 kg     Review of Systems:  Gen:  Denies  fever, sweats, chills weight loss  HEENT: Denies blurred vision, double vision, ear pain, eye pain, hearing loss, nose bleeds, sore throat Cardiac:  No dizziness, chest pain or heaviness, chest tightness,edema, No JVD Resp:   No cough, -sputum production, -shortness of breath,-wheezing, -hemoptysis,  Gi: Denies swallowing difficulty, stomach pain, nausea or vomiting, diarrhea, constipation, Other:  All other systems  negative   BP (!) 73/52    Pulse (!) 107    Temp 98.8 F (37.1 C) (Oral)    Resp 13    Ht 5\' 3"  (1.6 m)    Wt 112 kg    SpO2 98%    BMI 43.74 kg/m       Physical Examination:   General Appearance: No distress  EYES PERRLA, EOM intact.   NECK Supple, No JVD Pulmonary: normal breath sounds, No wheezing.  CardiovascularNormal S1,S2.  No m/r/g.   Abdomen: Benign, Soft, non-tender. ALL OTHER ROS ARE NEGATIVE      Labs/imaging that I havepersonally reviewed  (right click  and "Reselect all SmartList Selections" daily)  CBC    Component Value Date/Time   WBC 7.0 12/30/2021 0514   RBC 3.60 (L) 12/30/2021 0514   HGB 10.6 (L) 12/30/2021 0514   HGB 13.7 01/30/2014 0026   HCT 34.1 (L) 12/30/2021 0514   HCT 41.8 01/30/2014 0026   PLT 156 12/30/2021 0514   PLT 176 06/20/2014 1722   MCV 94.7 12/30/2021 0514   MCV 92 01/30/2014 0026   MCH 29.4 12/30/2021 0514   MCHC 31.1 12/30/2021 0514   RDW 15.3 12/30/2021 0514   RDW 14.4 01/30/2014 0026   LYMPHSABS 2.1 09/19/2021 1321   LYMPHSABS 2.2 05/29/2012 0418   MONOABS 0.4 09/19/2021 1321   MONOABS 0.7 05/29/2012 0418   EOSABS 0.2 09/19/2021 1321   EOSABS 0.5 05/29/2012 0418   BASOSABS 0.1 09/19/2021 1321   BASOSABS 0.1 05/29/2012 0418   BMP Latest Ref Rng & Units 12/30/2021 12/29/2021 12/28/2021  Glucose 70 - 99 mg/dL 65(L) 123(H) 261(H)  BUN 8 - 23 mg/dL 43(H) 58(H) 65(H)  Creatinine 0.44 - 1.00 mg/dL 1.13(H) 1.72(H) 1.95(H)  Sodium 135 - 145 mmol/L 139 136 135  Potassium 3.5 - 5.1 mmol/L 3.8 4.2 5.5(H)  Chloride 98 - 111 mmol/L 107 101 101  CO2 22 - 32 mmol/L 25 25 24   Calcium 8.9 - 10.3 mg/dL 9.0 9.0 8.8(L)       ASSESSMENT AND PLAN SYNOPSIS  76 yo admitted to [facility] for low BP related to sepsis from UTI    LOW BP due to sepsis and hypovolemia Continue IV abx No pressors at this time Continue IV abx Midodrine 10 mg tid Avoid sedatives and anti HTN meds  CARDIAC ICU monitoring   ACUTE KIDNEY INJURY/Renal Failure -continue Foley Catheter-assess need -Avoid nephrotoxic agents -Follow urine output, BMP -Ensure adequate renal perfusion, optimize oxygenation -Renal dose medications   Intake/Output Summary (Last 24 hours) at 12/31/2021 1310 Last data filed at 12/31/2021 0910 Gross per 24 hour  Intake 1402.61 ml  Output 1050 ml  Net 352.61 ml      INFECTIOUS DISEASE -continue antibiotics as prescribed -follow up cultures   GI GI PROPHYLAXIS as indicated  NUTRITIONAL  STATUS DIET-->as tolerated Constipation protocol as indicated   ELECTROLYTES -follow labs as needed -replace as needed -pharmacy consultation and following   ACUTE ANEMIA- TRANSFUSE AS NEEDED CONSIDER TRANSFUSION  IF HGB<7 DVT PRX with TED/SCD's ONLY     Best practice (right click and "Reselect all SmartList Selections" daily)  Code Status:  FULL Disposition:ICU  Labs   CBC: Recent Labs  Lab 12/28/21 1553 12/29/21 0700 12/30/21 0514  WBC 6.1 7.6 7.0  HGB 10.8* 10.7* 10.6*  HCT 35.1* 34.8* 34.1*  MCV 96.2 95.1 94.7  PLT 173 163 009    Basic Metabolic Panel: Recent Labs  Lab 12/28/21 1553 12/29/21 0830 12/30/21 0514  NA 135 136 139  K 5.5* 4.2 3.8  CL 101 101 107  CO2 24 25 25   GLUCOSE 261* 123* 65*  BUN 65* 58* 43*  CREATININE 1.95* 1.72* 1.13*  CALCIUM 8.8* 9.0 9.0   GFR: Estimated Creatinine Clearance: 52.5 mL/min (A) (by C-G formula based on SCr of 1.13 mg/dL (H)). Recent Labs  Lab 12/28/21 1553 12/29/21 0700 12/30/21 0514 12/31/21 0630  WBC 6.1 7.6 7.0  --   LATICACIDVEN  --   --   --  1.5    Liver Function Tests: No results for input(s): AST, ALT, ALKPHOS, BILITOT, PROT, ALBUMIN in the last 168 hours. No results for input(s): LIPASE, AMYLASE in the last 168 hours. No results for input(s): AMMONIA in the last 168 hours.  ABG    Component Value Date/Time   HCO3 25.1 03/06/2020 0805   TCO2 20 07/11/2008 1135   ACIDBASEDEF 3.5 (H) 03/06/2020 0805   O2SAT 26.2 03/06/2020 0805     Coagulation Profile: Recent Labs  Lab 12/28/21 1713  INR 1.1    Cardiac Enzymes: No results for input(s): CKTOTAL, CKMB, CKMBINDEX, TROPONINI in the last 168 hours.  HbA1C: Hgb A1c MFr Bld  Date/Time Value Ref Range Status  02/09/2021 06:50 AM 6.6 (H) 4.8 - 5.6 % Final    Comment:    (NOTE) Pre diabetes:          5.7%-6.4%  Diabetes:              >6.4%  Glycemic control for   <7.0% adults with diabetes   06/12/2020 07:05 PM 8.4 (H) 4.8 - 5.6  % Final    Comment:    (NOTE) Pre diabetes:          5.7%-6.4%  Diabetes:              >6.4%  Glycemic control for   <7.0% adults with diabetes     CBG: Recent Labs  Lab 12/30/21 1634 12/30/21 2011 12/31/21 0555 12/31/21 0707 12/31/21 1258  GLUCAP 205* 197* 235* 242* 164*     Past Medical History:  She,  has a past medical history of Amnesia, Anxiety, Arthritis, Back pain, CHF (congestive heart failure) ([place]), Chronic kidney disease, Chronic pain syndrome, Cystocele, Depression, Diabetes mellitus without complication ([place]), Diabetes mellitus without complication ([place]), GERD (gastroesophageal reflux disease), History of blood transfusion, Hyperlipidemia, Hypertension, IBS (irritable bowel syndrome), Obesity, Osteoporosis, Osteoporosis, Panic attacks, Seizure ([place]), Shortness of breath dyspnea, Sleep apnea, and Thyroid nodule.   Surgical History:   Past Surgical History:  Procedure Laterality Date   ABDOMINAL HYSTERECTOMY     APPENDECTOMY     BACK SURGERY     CARDIAC CATHETERIZATION     CHOLECYSTECTOMY     CORONARY STENT INTERVENTION N/A 05/05/2018   Procedure: CORONARY STENT INTERVENTION;  Surgeon: Isaias Cowman, MD;  Location: [facility];  Service: Cardiovascular;  Laterality: N/A;   FOOT SURGERY     LEFT HEART CATH AND CORONARY ANGIOGRAPHY Left 05/05/2018   Procedure: LEFT HEART CATH AND CORONARY ANGIOGRAPHY;  Surgeon: Isaias Cowman, MD;  Location: [facility];  Service: Cardiovascular;  Laterality: Left;   OOPHORECTOMY     REPLACEMENT TOTAL KNEE     TONSILLECTOMY       Social History:   reports that she has never smoked. She has never used smokeless tobacco. She reports that she does not drink alcohol and does not use drugs.   Family History:  Her family history includes Cancer in  her father; Cancer - Lung in her father; Kidney cancer in her sister; Stomach cancer in her mother. There is no history of Thyroid disease.    Allergies No Known Allergies   Home Medications  Prior to Admission medications   Medication Sig Start Date End Date Taking? Authorizing Provider  aspirin EC 81 MG tablet Take 81 mg by mouth daily. Swallow whole.   Yes [provider]  atorvastatin (LIPITOR) 40 MG tablet Take 40 mg by mouth daily. 12/06/19  Yes [provider]  busPIRone (BUSPAR) 30 MG tablet Take 30 mg by mouth 2 (two) times daily.   Yes [provider]  calcium-vitamin D (OSCAL 500/200 D-3) 500-200 MG-UNIT tablet Take 1 tablet by mouth 2 (two) times daily. 02/20/21 02/20/22 Yes Fritzi Mandes, MD  Cholecalciferol 25 MCG (1000 UT) tablet Take 1,000 Units by mouth daily.   Yes [provider]  estradiol (ESTRACE) 0.1 MG/GM vaginal cream Apply one pea-sized amount around the opening of the urethra every day for 2 weeks, then 3 times weekly moving forward. 08/14/21  Yes Vaillancourt, Samantha, PA-C  ferrous sulfate 325 (65 FE) MG tablet Take 325 mg by mouth 2 (two) times daily with a meal.   Yes [provider]  furosemide (LASIX) 40 MG tablet Take 40 mg by mouth.   Yes [provider]  gabapentin (NEURONTIN) 600 MG tablet Take 1,200 mg by mouth 2 (two) times daily.   Yes [provider]  LANTUS SOLOSTAR 100 UNIT/ML Solostar Pen Inject 15 Units into the skin at bedtime. 02/20/21  Yes Fritzi Mandes, MD  levothyroxine (SYNTHROID) 75 MCG tablet Take 1 tablet (75 mcg total) by mouth daily before breakfast. 02/08/20  Yes Lorella Nimrod, MD  lisinopril (ZESTRIL) 30 MG tablet Take 30 mg by mouth daily.   Yes [provider]  memantine (NAMENDA XR) 28 MG CP24 24 hr capsule Take 28 mg by mouth daily. 12/19/21  Yes [provider]  metFORMIN (GLUCOPHAGE) 1000 MG tablet Take 1,000 mg by mouth 2 (two) times daily with a meal.   Yes [provider]  metoprolol succinate (TOPROL-XL) 50 MG 24 hr tablet Take 50 mg by mouth daily. Take with or immediately following a  meal.   Yes [provider]  montelukast (SINGULAIR) 10 MG tablet SMARTSIG:1 Tablet(s) By Mouth Every Evening 12/09/21  Yes [provider]  NOVOLOG FLEXPEN 100 UNIT/ML FlexPen SMARTSIG:15 Unit(s) SUB-Q Daily 11/29/21  Yes [provider]  oxyCODONE-acetaminophen (PERCOCET) 10-325 MG tablet Take 1 tablet by mouth 4 (four) times daily as needed. 12/05/21  Yes [provider]  pantoprazole (PROTONIX) 40 MG tablet Take 40 mg by mouth daily as needed (heartburn).   Yes [provider]  pioglitazone (ACTOS) 15 MG tablet Take 15 mg by mouth daily. 11/14/21  Yes [provider]  tolterodine (DETROL LA) 2 MG 24 hr capsule Take 2 mg by mouth daily. 09/21/21  Yes [provider]  topiramate (TOPAMAX) 200 MG tablet Take 200 mg by mouth at bedtime. 12/22/20  Yes [provider]  TRULICITY 1.5 VC/9.4WH SOPN Inject 1.5 mg into the skin once a week. 11/17/21  Yes [provider]  VIIBRYD 40 MG TABS Take 40 mg by mouth daily. 02/01/21  Yes [provider]  BELSOMRA 15 MG TABS Take 15 mg by mouth daily. Patient not taking: Reported on 12/28/2021 03/16/21   [provider]  DULoxetine (CYMBALTA) 60 MG capsule Take 60 mg by mouth in the morning. Patient  not taking: Reported on 12/28/2021    [provider]  methocarbamol (ROBAXIN) 750 MG tablet Take 1 tablet (750 mg total) by mouth every 8 (eight) hours. Patient not taking: Reported on 12/28/2021 04/04/21   Dawley, Troy C, DO  mirabegron ER (MYRBETRIQ) 50 MG TB24 tablet Take 1 tablet (50 mg total) by mouth daily. Patient not taking: Reported on 12/28/2021 08/27/21   Bjorn Loser, MD  nitrofurantoin (MACRODANTIN) 100 MG capsule Take 1 capsule (100 mg total) by mouth daily. Patient not taking: Reported on 12/28/2021 06/25/21   Bjorn Loser, MD  QUEtiapine (SEROQUEL) 200 MG tablet Take 200 mg by mouth at bedtime. Patient not taking: Reported on 12/28/2021    [provider]      Corrin Parker, M.D.  [facility]  Medical Director [place] Director [facility] Cardio-Pulmonary Department

## 2021-12-31 NOTE — Assessment & Plan Note (Signed)
AKI with CKD stage IIIb.  Creatinine improved to baseline. -Monitor renal function -Avoid nephrotoxins

## 2021-12-31 NOTE — Progress Notes (Signed)
Patient has arrived to 2c as an ICU transfer. Patient presents in no acute distress at this time. Vitals are WNL and patient is able to verbally deny pain.

## 2021-12-31 NOTE — Assessment & Plan Note (Signed)
B12 within normal limit. PT and OT are recommending SNF. -TOC consult

## 2021-12-31 NOTE — Consult Note (Deleted)
NAME:  [name], MRN:  [id number], DOB:  07-09-1946, LOS: 2 ADMISSION DATE:  12/28/2021   History of Present Illness:  76 year old female with history of obesity, insulin-dependent diabetes mellitus type 2, GERD, CKD3a, hypothyroid, depression, hyperlipidemia, depression, anxiety, neuropathy, who presents to the emergency department for chief concerns of chest pain and back pain. Most likely musculoskeletal as it increases with movement.   Troponin remain negative.  EKG without any acute changes.   D-dimer was elevated at 1.8, lower extremity venous Doppler was negative for DVT and VQ scan was negative for PE.    She did initially started on heparin infusion which was discontinued this morning.   UA with large leukocyte and bacteria. Some abdominal pain but could not specify any urinary symptoms stating that she always have increased urinary urgency and dysuria. Based on progressive weakness and prior urine culture of group B strep-started her on ceftriaxone     PT is recommending SNF.  Patient feels unsafe at home due to progressive weakness.   Echocardiogram was also ordered by admitting provider for her concern of lower extremity edema.  BNP within normal limit, at 8.   2/26: Preliminary urine cultures with more than 100,000 colonies of gram-negative rods. 2/27 transferred to ICU for low BP(70's), alert and awake   Significant Hospital Events: Including procedures, antibiotic start and stop dates in addition to other pertinent events   2/26 admitted for weakness 2/26: Preliminary urine cultures with more than 100,000 colonies of gram-negative rods. 2/27 transferred to ICU for low BP, alert and awake     Micro Data:  COVID NEG FLU NEG URINE CULTURE >=100,000 COLONIES/mL ESCHERICHIA COLI  60,000 COLONIES/mL STREPTOCOCCUS AGALACTIAE   Antimicrobials:   Antibiotics Given (last 72 hours)     Date/Time Action Medication Dose Rate   12/29/21 2100 New Bag/Given    cefTRIAXone (ROCEPHIN) 2 g in sodium chloride 0.9 % 100 mL IVPB 2 g 200 mL/hr   12/30/21 1706 New Bag/Given   cefTRIAXone (ROCEPHIN) 2 g in sodium chloride 0.9 % 100 mL IVPB 2 g 200 mL/hr            Interim History / Subjective:  Alert and awake Following commands No significant symptoms       Objective   Blood pressure (!) 73/52, pulse (!) 107, temperature 98.8 F (37.1 C), temperature source Oral, resp. rate 13, height 5\' 3"  (1.6 m), weight 112 kg, SpO2 98 %.        Intake/Output Summary (Last 24 hours) at 12/31/2021 1330 Last data filed at 12/31/2021 0910 Gross per 24 hour  Intake 1402.61 ml  Output 1050 ml  Net 352.61 ml   Filed Weights   12/29/21 1701 12/30/21 0500 12/31/21 0354  Weight: 112.9 kg 113.3 kg 112 kg     Review of Systems:  Gen:  Denies  fever, sweats, chills weight loss  HEENT: Denies blurred vision, double vision, ear pain, eye pain, hearing loss, nose bleeds, sore throat Cardiac:  No dizziness, chest pain or heaviness, chest tightness,edema, No JVD Resp:   No cough, -sputum production, -shortness of breath,-wheezing, -hemoptysis,  Other:  All other systems negative     Physical Examination:   General Appearance: No distress  EYES PERRLA, EOM intact.   NECK Supple, No JVD Pulmonary: normal breath sounds, No wheezing.  CardiovascularNormal S1,S2.  No m/r/g.   ALL OTHER ROS ARE NEGATIVE      Labs/imaging that I havepersonally reviewed  (right click and "Reselect all  SmartList Selections" daily)       ASSESSMENT AND PLAN SYNOPSIS  76 yo morbidly obese white female with sepsis with UTI  LOW BP due to sepsis and hypovolemia Continue IV abx No pressors at this time Continue IV abx Midodrine 10 mg tid Avoid sedatives and anti HTN meds   CARDIAC ICU monitoring   ACUTE KIDNEY INJURY/Renal Failure -continue Foley Catheter-assess need -Avoid nephrotoxic agents -Follow urine output, BMP -Ensure adequate renal perfusion,  optimize oxygenation -Renal dose medications   Intake/Output Summary (Last 24 hours) at 12/31/2021 1330 Last data filed at 12/31/2021 0910 Gross per 24 hour  Intake 1402.61 ml  Output 1050 ml  Net 352.61 ml   BMP Latest Ref Rng & Units 12/30/2021 12/29/2021 12/28/2021  Glucose 70 - 99 mg/dL 65(L) 123(H) 261(H)  BUN 8 - 23 mg/dL 43(H) 58(H) 65(H)  Creatinine 0.44 - 1.00 mg/dL 1.13(H) 1.72(H) 1.95(H)  Sodium 135 - 145 mmol/L 139 136 135  Potassium 3.5 - 5.1 mmol/L 3.8 4.2 5.5(H)  Chloride 98 - 111 mmol/L 107 101 101  CO2 22 - 32 mmol/L 25 25 24   Calcium 8.9 - 10.3 mg/dL 9.0 9.0 8.8(L)     INFECTIOUS DISEASE -continue antibiotics as prescribed -follow up cultures  ENDO - ICU hypoglycemic\Hyperglycemia protocol -check FSBS per protocol   GI GI PROPHYLAXIS as indicated  NUTRITIONAL STATUS DIET--> as tolerated Constipation protocol as indicated   ELECTROLYTES -follow labs as needed -replace as needed -pharmacy consultation and following   ACUTE ANEMIA- TRANSFUSE AS NEEDED CONSIDER TRANSFUSION  IF HGB<7 DVT PRX with TED/SCD's ONLY     Best practice (right click and "Reselect all SmartList Selections" daily)  Mobility:  OOB  Code Status:  FULL Disposition:ICU  Labs   CBC: Recent Labs  Lab 12/28/21 1553 12/29/21 0700 12/30/21 0514  WBC 6.1 7.6 7.0  HGB 10.8* 10.7* 10.6*  HCT 35.1* 34.8* 34.1*  MCV 96.2 95.1 94.7  PLT 173 163 283    Basic Metabolic Panel: Recent Labs  Lab 12/28/21 1553 12/29/21 0830 12/30/21 0514  NA 135 136 139  K 5.5* 4.2 3.8  CL 101 101 107  CO2 24 25 25   GLUCOSE 261* 123* 65*  BUN 65* 58* 43*  CREATININE 1.95* 1.72* 1.13*  CALCIUM 8.8* 9.0 9.0   GFR: Estimated Creatinine Clearance: 52.5 mL/min (A) (by C-G formula based on SCr of 1.13 mg/dL (H)). Recent Labs  Lab 12/28/21 1553 12/29/21 0700 12/30/21 0514 12/31/21 0630  WBC 6.1 7.6 7.0  --   LATICACIDVEN  --   --   --  1.5    Liver Function Tests: No results for  input(s): AST, ALT, ALKPHOS, BILITOT, PROT, ALBUMIN in the last 168 hours. No results for input(s): LIPASE, AMYLASE in the last 168 hours. No results for input(s): AMMONIA in the last 168 hours.  ABG    Component Value Date/Time   HCO3 25.1 03/06/2020 0805   TCO2 20 07/11/2008 1135   ACIDBASEDEF 3.5 (H) 03/06/2020 0805   O2SAT 26.2 03/06/2020 0805     Coagulation Profile: Recent Labs  Lab 12/28/21 1713  INR 1.1    Cardiac Enzymes: No results for input(s): CKTOTAL, CKMB, CKMBINDEX, TROPONINI in the last 168 hours.  HbA1C: Hgb A1c MFr Bld  Date/Time Value Ref Range Status  02/09/2021 06:50 AM 6.6 (H) 4.8 - 5.6 % Final    Comment:    (NOTE) Pre diabetes:          5.7%-6.4%  Diabetes:              >  6.4%  Glycemic control for   <7.0% adults with diabetes   06/12/2020 07:05 PM 8.4 (H) 4.8 - 5.6 % Final    Comment:    (NOTE) Pre diabetes:          5.7%-6.4%  Diabetes:              >6.4%  Glycemic control for   <7.0% adults with diabetes     CBG: Recent Labs  Lab 12/30/21 1634 12/30/21 2011 12/31/21 0555 12/31/21 0707 12/31/21 1258  GLUCAP 205* 197* 235* 242* 164*     Past Medical History:  She,  has a past medical history of Amnesia, Anxiety, Arthritis, Back pain, CHF (congestive heart failure) ([place]), Chronic kidney disease, Chronic pain syndrome, Cystocele, Depression, Diabetes mellitus without complication ([place]), Diabetes mellitus without complication ([place]), GERD (gastroesophageal reflux disease), History of blood transfusion, Hyperlipidemia, Hypertension, IBS (irritable bowel syndrome), Obesity, Osteoporosis, Osteoporosis, Panic attacks, Seizure ([place]), Shortness of breath dyspnea, Sleep apnea, and Thyroid nodule.   Surgical History:   Past Surgical History:  Procedure Laterality Date   ABDOMINAL HYSTERECTOMY     APPENDECTOMY     BACK SURGERY     CARDIAC CATHETERIZATION     CHOLECYSTECTOMY     CORONARY STENT INTERVENTION N/A 05/05/2018   Procedure:  CORONARY STENT INTERVENTION;  Surgeon: Isaias Cowman, MD;  Location: [facility];  Service: Cardiovascular;  Laterality: N/A;   FOOT SURGERY     LEFT HEART CATH AND CORONARY ANGIOGRAPHY Left 05/05/2018   Procedure: LEFT HEART CATH AND CORONARY ANGIOGRAPHY;  Surgeon: Isaias Cowman, MD;  Location: [facility];  Service: Cardiovascular;  Laterality: Left;   OOPHORECTOMY     REPLACEMENT TOTAL KNEE     TONSILLECTOMY       Social History:   reports that she has never smoked. She has never used smokeless tobacco. She reports that she does not drink alcohol and does not use drugs.   Family History:  Her family history includes Cancer in her father; Cancer - Lung in her father; Kidney cancer in her sister; Stomach cancer in her mother. There is no history of Thyroid disease.   Allergies No Known Allergies   Home Medications  Prior to Admission medications   Medication Sig Start Date End Date Taking? Authorizing Provider  aspirin EC 81 MG tablet Take 81 mg by mouth daily. Swallow whole.   Yes [provider]  atorvastatin (LIPITOR) 40 MG tablet Take 40 mg by mouth daily. 12/06/19  Yes [provider]  busPIRone (BUSPAR) 30 MG tablet Take 30 mg by mouth 2 (two) times daily.   Yes [provider]  calcium-vitamin D (OSCAL 500/200 D-3) 500-200 MG-UNIT tablet Take 1 tablet by mouth 2 (two) times daily. 02/20/21 02/20/22 Yes Fritzi Mandes, MD  Cholecalciferol 25 MCG (1000 UT) tablet Take 1,000 Units by mouth daily.   Yes [provider]  estradiol (ESTRACE) 0.1 MG/GM vaginal cream Apply one pea-sized amount around the opening of the urethra every day for 2 weeks, then 3 times weekly moving forward. 08/14/21  Yes Vaillancourt, Samantha, PA-C  ferrous sulfate 325 (65 FE) MG tablet Take 325 mg by mouth 2 (two) times daily with a meal.   Yes [provider]  furosemide (LASIX) 40 MG tablet Take 40 mg by mouth.   Yes [provider]  gabapentin (NEURONTIN) 600 MG tablet Take 1,200 mg by mouth 2 (two) times daily.   Yes [provider]  LANTUS SOLOSTAR 100  UNIT/ML Solostar Pen Inject 15 Units into the skin at bedtime. 02/20/21  Yes Fritzi Mandes, MD  levothyroxine (SYNTHROID) 75 MCG tablet Take 1 tablet (75 mcg total) by mouth daily before breakfast. 02/08/20  Yes Lorella Nimrod, MD  lisinopril (ZESTRIL) 30 MG tablet Take 30 mg by mouth daily.   Yes [provider]  memantine (NAMENDA XR) 28 MG CP24 24 hr capsule Take 28 mg by mouth daily. 12/19/21  Yes [provider]  metFORMIN (GLUCOPHAGE) 1000 MG tablet Take 1,000 mg by mouth 2 (two) times daily with a meal.   Yes [provider]  metoprolol succinate (TOPROL-XL) 50 MG 24 hr tablet Take 50 mg by mouth daily. Take with or immediately following a meal.   Yes [provider]  montelukast (SINGULAIR) 10 MG tablet SMARTSIG:1 Tablet(s) By Mouth Every Evening 12/09/21  Yes [provider]  NOVOLOG FLEXPEN 100 UNIT/ML FlexPen SMARTSIG:15 Unit(s) SUB-Q Daily 11/29/21  Yes [provider]  oxyCODONE-acetaminophen (PERCOCET) 10-325 MG tablet Take 1 tablet by mouth 4 (four) times daily as needed. 12/05/21  Yes [provider]  pantoprazole (PROTONIX) 40 MG tablet Take 40 mg by mouth daily as needed (heartburn).   Yes [provider]  pioglitazone (ACTOS) 15 MG tablet Take 15 mg by mouth daily. 11/14/21  Yes [provider]  tolterodine (DETROL LA) 2 MG 24 hr capsule Take 2 mg by mouth daily. 09/21/21  Yes [provider]  topiramate (TOPAMAX) 200 MG tablet Take 200 mg by mouth at bedtime. 12/22/20  Yes [provider]  TRULICITY 1.5 LZ/7.6BH SOPN Inject 1.5 mg into the skin once a week. 11/17/21  Yes [provider]  VIIBRYD 40 MG TABS Take 40 mg by mouth daily. 02/01/21  Yes [provider]  BELSOMRA 15 MG TABS Take 15 mg by mouth daily. Patient not taking: Reported on  12/28/2021 03/16/21   [provider]  DULoxetine (CYMBALTA) 60 MG capsule Take 60 mg by mouth in the morning. Patient not taking: Reported on 12/28/2021    [provider]  methocarbamol (ROBAXIN) 750 MG tablet Take 1 tablet (750 mg total) by mouth every 8 (eight) hours. Patient not taking: Reported on 12/28/2021 04/04/21   Dawley, Troy C, DO  mirabegron ER (MYRBETRIQ) 50 MG TB24 tablet Take 1 tablet (50 mg total) by mouth daily. Patient not taking: Reported on 12/28/2021 08/27/21   Bjorn Loser, MD  nitrofurantoin (MACRODANTIN) 100 MG capsule Take 1 capsule (100 mg total) by mouth daily. Patient not taking: Reported on 12/28/2021 06/25/21   Bjorn Loser, MD  QUEtiapine (SEROQUEL) 200 MG tablet Take 200 mg by mouth at bedtime. Patient not taking: Reported on 12/28/2021    [provider]       DVT/GI PRX  assessed I Assessed the need for Labs I Assessed the need for Foley I Assessed the need for Central Venous Line Family Discussion when available I Assessed the need for Mobilization I made an Assessment of medications to be adjusted accordingly Safety Risk assessment completed  CASE DISCUSSED IN MULTIDISCIPLINARY ROUNDS WITH ICU TEAM     Critical Care Time devoted to patient care services described in this note is 55 minutes.    Corrin Parker, M.D.  [facility]  Medical Director [place] Director [facility] Department

## 2021-12-31 NOTE — Progress Notes (Signed)
Progress Note   Patient: [name] ATF:[id number] DOB: 05-May-1946 DOA: 12/28/2021     2 DOS: the patient was seen and examined on 12/31/2021   Brief hospital course: Taken from H&P.   Ms. Jannelle Notaro is a 76 year old female with history of obesity, insulin-dependent diabetes mellitus type 2, GERD, CKD3a, hypothyroid, depression, hyperlipidemia, depression, anxiety, neuropathy, who presents to the emergency department for chief concerns of chest pain and back pain. Most likely musculoskeletal as it increases with movement.  Troponin remain negative.  EKG without any acute changes.  D-dimer was elevated at 1.8, lower extremity venous Doppler was negative for DVT and VQ scan was negative for PE.  She did initially started on heparin infusion which was discontinued this morning.  UA with large leukocyte and bacteria.  Urine cultures pending. Some abdominal pain but could not specify any urinary symptoms stating that she always have increased urinary urgency and dysuria. Based on progressive weakness and prior urine culture of group B strep-started her on ceftriaxone while waiting for cultures.  She does have some urinary symptoms.  PT is recommending SNF.  Patient feels unsafe at home due to progressive weakness.  Echocardiogram was also ordered by admitting provider for her concern of lower extremity edema.  BNP within normal limit, at 8.  2/26: Preliminary urine cultures with more than 100,000 colonies of gram-negative rods.  2/27: Urine cultures with E. coli, only resistant to ampicillin and Unasyn.  Antibiotics switched with cefazolin. Became hypotensive overnight, no chest pain or shortness of breath.  Blood pressure remained soft despite getting 2 L of IV fluid.  Holding Lasix and metoprolol, giving some midodrine. She was transferred to stepdown with a concern that she might need pressors, PCCM is aware. Currently blood pressure improving.  Repeat troponin remained  negative.   Assessment and Plan: * Chest pain- (present on admission) Atypical and most likely musculoskeletal.  Troponin remain negative.  Echocardiogram with normal EF and grade 1 diastolic dysfunction.  No wall motion abnormality. -Supportive care  Hypotension Became hypotensive overnight.  Just feeling overall weak, no chest pain or shortness of breath.  Troponin remained negative. Patient received 2 L of IV bolus and was transferred to stepdown for concern of as she might need pressors-PCCM aware. Blood pressure was on softer side this morning. -Midodrine 10 mg 3 times a day -Hold Lasix and metoprolol -Blood cultures  UTI (urinary tract infection)- (present on admission) Patient do have some dysuria although stating that it is chronic.  Prior urinary culture with group B strep.  UA with leukocytosis and many bacteria. Urine cultures with E. coli, only resistant to ampicillin and Unasyn. -Switch antibiotics with cefazolin    Acute kidney injury superimposed on CKD ([place])- (present on admission) AKI with CKD stage IIIb.  Creatinine improved to baseline. -Monitor renal function -Avoid nephrotoxins  Hyperkalemia- (present on admission) Resolved. -Continue to monitor  Hypothyroidism- (present on admission) TSH within normal limit. -Continue home Synthroid  Anxiety state- (present on admission) Patient appears anxious. -Continue home BuSpar  Functional diarrhea- (present on admission) No diarrhea today.  Mostly secondary to milk products, might be some lactose intolerance. -Avoid lactose and milk products. -Observe and supportive care  Morbid obesity with BMI of 45.0-49.9, adult ([place]) Estimated body mass index is 44.11 kg/m as calculated from the following:   Height as of this encounter: 5\' 3"  (1.6 m).   Weight as of this encounter: 112.9 kg.   This will complicate overall prognosis.  Insulin dependent type  2 diabetes mellitus ([place]) 1 episode of hypoglycemia. On  metformin, Trulicity and Actos at home along with NovoLog. -Decrease Semglee to 25 units twice daily instead of 30 -Continue with SSI and mealtime coverage. -Continue to monitor  Elevated d-dimer- (present on admission) Very nonspecific.  Lower extremity venous Doppler was negative for DVT and VQ scan was negative for PE.  She was started on heparin infusion in ED.  Remained on room air. -Discontinue heparin infusion  Swelling of lower extremity- (present on admission) No significant pitting edema. Echocardiogram ordered-pending BNP within normal limit. -We will restart home Lasix from tomorrow  Weakness B12 within normal limit. PT and OT are recommending SNF. -TOC consult  Chronic pain syndrome- (present on admission) Patient was on Percocet at home. -Continue home dose of Percocet but decrease the frequency from every 4 to Q8  Essential hypertension- (present on admission) Blood pressure little soft. -Keep holding lisinopril-we will restart if needed -Hold Lasix and metoprolol   Subjective: Patient continued to feel weak.  Denies any chest pain, nausea or vomiting.  No dizziness.  Physical Exam: Vitals:   12/31/21 1000 12/31/21 1030 12/31/21 1100 12/31/21 1200  BP: (!) 79/60 96/84 (!) 70/48 (!) 73/52  Pulse: (!) 116 (!) 108 (!) 108 (!) 107  Resp: 14 17 11 13   Temp:      TempSrc:      SpO2: 94% 95% 93% 98%  Weight:      Height:       General.  Morbidly obese elderly lady, in no acute distress. Pulmonary.  Lungs clear bilaterally, normal respiratory effort. CV.  Regular rate and rhythm, no JVD, rub or murmur. Abdomen.  Soft, nontender, nondistended, BS positive. CNS.  Alert and oriented .  No focal neurologic deficit. Extremities.  No edema, no cyanosis, pulses intact and symmetrical. Psychiatry.  Judgment and insight appears normal.  Data Reviewed: Prior notes, labs and images reviewed.  Family Communication: Discussed with daughter on  phone.  Disposition: Status is: Inpatient Remains inpatient appropriate because: Severity of illness.  Planned Discharge Destination: Skilled nursing facility  DVT prophylaxis.  Lovenox  Time spent: 50 minutes  This record has been created using Systems analyst. Errors have been sought and corrected,but may not always be located. Such creation errors do not reflect on the standard of care.  Author: Lorella Nimrod, MD 12/31/2021 1:50 PM  For on call review [email or web address].

## 2022-01-01 DIAGNOSIS — R079 Chest pain, unspecified: Secondary | ICD-10-CM | POA: Diagnosis not present

## 2022-01-01 DIAGNOSIS — N179 Acute kidney failure, unspecified: Secondary | ICD-10-CM | POA: Diagnosis not present

## 2022-01-01 DIAGNOSIS — M1990 Unspecified osteoarthritis, unspecified site: Secondary | ICD-10-CM | POA: Diagnosis not present

## 2022-01-01 DIAGNOSIS — F411 Generalized anxiety disorder: Secondary | ICD-10-CM | POA: Diagnosis not present

## 2022-01-01 LAB — RESP PANEL BY RT-PCR (FLU A&B, COVID) ARPGX2
Influenza A by PCR: NEGATIVE
Influenza B by PCR: NEGATIVE
SARS Coronavirus 2 by RT PCR: NEGATIVE

## 2022-01-01 LAB — BASIC METABOLIC PANEL
Anion gap: 8 (ref 5–15)
BUN: 56 mg/dL — ABNORMAL HIGH (ref 8–23)
CO2: 22 mmol/L (ref 22–32)
Calcium: 8.7 mg/dL — ABNORMAL LOW (ref 8.9–10.3)
Chloride: 110 mmol/L (ref 98–111)
Creatinine, Ser: 1.37 mg/dL — ABNORMAL HIGH (ref 0.44–1.00)
GFR, Estimated: 41 mL/min — ABNORMAL LOW (ref >60)
Glucose, Bld: 123 mg/dL — ABNORMAL HIGH (ref 70–99)
Potassium: 4.2 mmol/L (ref 3.5–5.1)
Sodium: 140 mmol/L (ref 135–145)

## 2022-01-01 LAB — CBC
HCT: 32.3 % — ABNORMAL LOW (ref 36.0–46.0)
Hemoglobin: 10.2 g/dL — ABNORMAL LOW (ref 12.0–15.0)
MCH: 29.7 pg (ref 26.0–34.0)
MCHC: 31.6 g/dL (ref 30.0–36.0)
MCV: 94.2 fL (ref 80.0–100.0)
Platelets: 155 10*3/uL (ref 150–400)
RBC: 3.43 MIL/uL — ABNORMAL LOW (ref 3.87–5.11)
RDW: 15.3 % (ref 11.5–15.5)
WBC: 8.5 10*3/uL (ref 4.0–10.5)
nRBC: 0 % (ref 0.0–0.2)

## 2022-01-01 LAB — MAGNESIUM: Magnesium: 1.9 mg/dL (ref 1.7–2.4)

## 2022-01-01 LAB — GLUCOSE, CAPILLARY
Glucose-Capillary: 95 mg/dL (ref 70–99)
Glucose-Capillary: 139 mg/dL — ABNORMAL HIGH (ref 70–99)

## 2022-01-01 LAB — HEMOGLOBIN A1C
Hgb A1c MFr Bld: 10.4 % — ABNORMAL HIGH (ref 4.8–5.6)
Mean Plasma Glucose: 252 mg/dL

## 2022-01-01 LAB — PHOSPHORUS: Phosphorus: 3.5 mg/dL (ref 2.5–4.6)

## 2022-01-01 LAB — PROCALCITONIN: Procalcitonin: <0.1 ng/mL

## 2022-01-01 MED ORDER — LIDOCAINE 5 % EX PTCH: 2.0000 | MEDICATED_PATCH | CUTANEOUS | 0 refills | Status: DC

## 2022-01-01 MED ORDER — FUROSEMIDE 40 MG PO TABS: 40.0000 mg | ORAL_TABLET | Freq: Every day | ORAL | 0 refills | Status: AC | PRN

## 2022-01-01 MED ORDER — MIDODRINE HCL 10 MG PO TABS: 10.0000 mg | ORAL_TABLET | Freq: Three times a day (TID) | ORAL | Status: DC

## 2022-01-01 MED ORDER — OXYCODONE-ACETAMINOPHEN 5-325 MG PO TABS: 1.0000 | ORAL_TABLET | Freq: Three times a day (TID) | ORAL | 0 refills | Status: DC | PRN

## 2022-01-01 MED ORDER — CEPHALEXIN 500 MG PO CAPS: 500.0000 mg | ORAL_CAPSULE | Freq: Four times a day (QID) | ORAL | 0 refills | Status: AC

## 2022-01-01 NOTE — TOC Transition Note (Signed)
Transition of Care [facility]) - CM/SW Discharge Note   Patient Details  Name: [name] MRN: [id number] Date of Birth: [birth year]  [facility] CM/SW Contact:  Beverly Sessions, RN Phone Number: 01/01/2022, 1:17 PM   Clinical Narrative:     Patient accepts bed at [facility] Patient to discharge today  Patient will DC to: [facility] Anticipated DC date: 01/01/22  Family notified:Patient has notified grandson Transport by: Johnanna Schneiders  Per MD patient ready for DC to . RN, patient, patient's family, and facility notified of DC. Discharge Summary sent to facility. RN given number for report. DC packet on chart. Ambulance transport requested for patient.  TOC signing off.  Isaias Cowman [facility] [phone]     Barriers to Discharge: Continued Medical Work up   Patient Goals and CMS Choice Patient states their goals for this hospitalization and ongoing recovery are:: patient and family decide on short term rehab CMS Medicare.gov Compare Post Acute Care list provided to:: Patient Choice offered to / list presented to : Patient  Discharge Placement                       Discharge Plan and Services   Discharge Planning Services: CM Consult Post Acute Care Choice: [place]          DME Arranged: N/A DME Agency: NA       HH Arranged: NA HH Agency: NA        Social Determinants of Health (SDOH) Interventions     Readmission Risk Interventions Readmission Risk Prevention Plan 03/07/2020  Transportation Screening Complete  PCP or Specialist Appt within 3-5 Days Complete  HRI or [place] Complete  Social Work Consult for [place] Planning/Counseling Complete  Palliative Care Screening Not Applicable  Medication Review [employer]) Complete  Some recent data might be hidden

## 2022-01-01 NOTE — Discharge Instructions (Signed)
It was pleasure taking care of you. You are being given 3 more days of antibiotics, please complete the course. We made some changes to your blood pressure medications as your blood pressure was low. Stop taking lisinopril, you can use Lasix only as needed for swelling. You can continue with metoprolol as it was basically being used for your heart rate. You are also being started on a new medicine called midodrine to help keep your blood pressure normal, it increases the blood pressure.  Please have a close follow-up with your doctors and they can adjust your medications accordingly. Keep yourself well-hydrated.

## 2022-01-01 NOTE — Progress Notes (Signed)
PT Cancellation Note  Patient Details Name: [name] MRN: [id number] DOB: 05-27-1946   Cancelled Treatment:    Reason Eval/Treat Not Completed: Other (comment).  Per nursing staff, pt being actively d/c and would not need PT at this time.  Please re-consult if needs arise.   Gwenlyn Saran, PT, DPT 01/01/22, 12:18 PM

## 2022-01-01 NOTE — Discharge Summary (Signed)
Physician Discharge Summary   Patient: [name] MRN: [id number] DOB: 1946-09-28  Admit date:     12/28/2021  Discharge date: 01/01/22  Discharge Physician: Lorella Nimrod   PCP: Macarthur Critchley, MD   Recommendations at discharge:  Complete the course of antibiotics as prescribed. Holding home lisinopril and making Lasix as needed due to softer blood pressure.  She is also being started on midodrine. Please obtain CBC and BMP within a week. Follow-up with primary care provider in 1 week  Discharge Diagnoses: Principal Problem:   Chest pain Active Problems:   UTI (urinary tract infection)   Hypotension   Acute kidney injury superimposed on CKD (HCC)   Hyperkalemia   Hypothyroidism   Anxiety state   Functional diarrhea   Morbid obesity with BMI of 45.0-49.9, adult (HCC)   Insulin dependent type 2 diabetes mellitus (HCC)   Elevated d-dimer   Swelling of lower extremity   Weakness   Chronic pain syndrome   Hyperlipidemia   Depression   GERD (gastroesophageal reflux disease)   Arthritis   Essential hypertension   CAD (coronary artery disease)   OSA (obstructive sleep apnea)   CPAP ventilation treatment not tolerated   Polypharmacy   Dysuria  Resolved Problems:   * No resolved hospital problems. [facility] Course: Taken from H&P.   [name] is a 76 year old female with history of obesity, insulin-dependent diabetes mellitus type 2, GERD, CKD3a, hypothyroid, depression, hyperlipidemia, depression, anxiety, neuropathy, who presents to the emergency department for chief concerns of chest pain and back pain. Most likely musculoskeletal as it increases with movement.  Troponin remain negative.  EKG without any acute changes.  D-dimer was elevated at 1.8, lower extremity venous Doppler was negative for DVT and VQ scan was negative for PE.  She did initially started on heparin infusion which was discontinued this morning.  UA with large leukocyte and bacteria.   Urine cultures pending. Some abdominal pain but could not specify any urinary symptoms stating that she always have increased urinary urgency and dysuria. Based on progressive weakness and prior urine culture of group B strep-started her on ceftriaxone while waiting for cultures.  She does have some urinary symptoms.  PT is recommending SNF.  Patient feels unsafe at home due to progressive weakness.  Echocardiogram was also ordered by admitting provider for her concern of lower extremity edema.  BNP within normal limit, at 8.  2/26: Preliminary urine cultures with more than 100,000 colonies of gram-negative rods.  2/27: Urine cultures with E. coli, only resistant to ampicillin and Unasyn.  Antibiotics switched with cefazolin, she was discharged on Keflex to complete the course. Became hypotensive overnight, no chest pain or shortness of breath.  Blood pressure remained soft despite getting 2 L of IV fluid.  Holding Lasix and metoprolol, giving some midodrine. She was transferred to stepdown with a concern that she might need pressors, PCCM is aware. Currently blood pressure improving.  Repeat troponin remained negative.  Blood pressure improved on midodrine.  We discontinue home dose of lisinopril and made Lasix as needed for edema.  She can continue with metoprolol as it was being used for heart rate.  If heart rate remained stable, she can hold metoprolol.  She will continue using midodrine as blood pressure is now within normal range with midodrine.  She will need a close follow-up with her PCP for further recommendations and titration of her medications.  We also decreased the dose of her opioids, and  new prescription was provided.  It is advisable to taper down her opioid use.  We also gave her some lidocaine patch for chronic back pain.  She will continue with rest of her home medications except mentioned above and follow-up with her providers.  Assessment and Plan: * Chest pain- (present  on admission) Atypical and most likely musculoskeletal.  Troponin remain negative.  Echocardiogram with normal EF and grade 1 diastolic dysfunction.  No wall motion abnormality. -Supportive care  Hypotension Became hypotensive overnight.  Just feeling overall weak, no chest pain or shortness of breath.  Troponin remained negative. Patient received 2 L of IV bolus and was transferred to stepdown for concern of as she might need pressors-PCCM aware. Blood pressure was on softer side this morning. -Midodrine 10 mg 3 times a day -Hold Lasix and metoprolol -Blood cultures  UTI (urinary tract infection)- (present on admission) Patient do have some dysuria although stating that it is chronic.  Prior urinary culture with group B strep.  UA with leukocytosis and many bacteria. Urine cultures with E. coli, only resistant to ampicillin and Unasyn. -Switch antibiotics with cefazolin    Acute kidney injury superimposed on CKD ([place])- (present on admission) AKI with CKD stage IIIb.  Creatinine improved to baseline. -Monitor renal function -Avoid nephrotoxins  Hyperkalemia- (present on admission) Resolved. -Continue to monitor  Hypothyroidism- (present on admission) TSH within normal limit. -Continue home Synthroid  Anxiety state- (present on admission) Patient appears anxious. -Continue home BuSpar  Functional diarrhea- (present on admission) No diarrhea today.  Mostly secondary to milk products, might be some lactose intolerance. -Avoid lactose and milk products. -Observe and supportive care  Morbid obesity with BMI of 45.0-49.9, adult ([place]) Estimated body mass index is 44.11 kg/m as calculated from the following:   Height as of this encounter: 5\' 3"  (1.6 m).   Weight as of this encounter: 112.9 kg.   This will complicate overall prognosis.  Insulin dependent type 2 diabetes mellitus (HCC) 1 episode of hypoglycemia. On metformin, Trulicity and Actos at home along with  NovoLog. -Decrease Semglee to 25 units twice daily instead of 30 -Continue with SSI and mealtime coverage. -Continue to monitor  Elevated d-dimer- (present on admission) Very nonspecific.  Lower extremity venous Doppler was negative for DVT and VQ scan was negative for PE.  She was started on heparin infusion in ED.  Remained on room air. -Discontinue heparin infusion  Swelling of lower extremity- (present on admission) No significant pitting edema. Echocardiogram ordered-pending BNP within normal limit. -We will restart home Lasix from tomorrow  Weakness B12 within normal limit. PT and OT are recommending SNF. -TOC consult  Chronic pain syndrome- (present on admission) Patient was on Percocet at home. -Continue home dose of Percocet but decrease the frequency from every 4 to Q8  Essential hypertension- (present on admission) Blood pressure little soft. -Keep holding lisinopril-we will restart if needed -Hold Lasix and metoprolol   Consultants: PCCM Procedures performed: None Disposition: Skilled nursing facility Diet recommendation:  Discharge Diet Orders (From admission, onward)     Start     Ordered   01/01/22 0000  Diet - low sodium heart healthy        01/01/22 1139           Cardiac and Carb modified diet  DISCHARGE MEDICATION: Allergies as of 01/01/2022   No Known Allergies      Medication List     STOP taking these medications    Belsomra 15 MG Tabs  Generic drug: Suvorexant   DULoxetine 60 MG capsule Commonly known as: CYMBALTA   lisinopril 30 MG tablet Commonly known as: ZESTRIL   methocarbamol 750 MG tablet Commonly known as: ROBAXIN   mirabegron ER 50 MG Tb24 tablet Commonly known as: MYRBETRIQ   nitrofurantoin 100 MG capsule Commonly known as: Macrodantin   oxyCODONE-acetaminophen 10-325 MG tablet Commonly known as: PERCOCET Replaced by: oxyCODONE-acetaminophen 5-325 MG tablet   QUEtiapine 200 MG tablet Commonly known as:  SEROQUEL       TAKE these medications    aspirin EC 81 MG tablet Take 81 mg by mouth daily. Swallow whole.   atorvastatin 40 MG tablet Commonly known as: LIPITOR Take 40 mg by mouth daily.   busPIRone 30 MG tablet Commonly known as: BUSPAR Take 30 mg by mouth 2 (two) times daily.   cephALEXin 500 MG capsule Commonly known as: KEFLEX Take 1 capsule (500 mg total) by mouth 4 (four) times daily for 3 days.   Cholecalciferol 25 MCG (1000 UT) tablet Take 1,000 Units by mouth daily.   estradiol 0.1 MG/GM vaginal cream Commonly known as: ESTRACE Apply one pea-sized amount around the opening of the urethra every day for 2 weeks, then 3 times weekly moving forward.   ferrous sulfate 325 (65 FE) MG tablet Take 325 mg by mouth 2 (two) times daily with a meal.   furosemide 40 MG tablet Commonly known as: LASIX Take 1 tablet (40 mg total) by mouth daily as needed. Please hold for next few days What changed:  when to take this reasons to take this additional instructions   gabapentin 600 MG tablet Commonly known as: NEURONTIN Take 1,200 mg by mouth 2 (two) times daily.   Lantus SoloStar 100 UNIT/ML Solostar Pen Generic drug: insulin glargine Inject 15 Units into the skin at bedtime.   levothyroxine 75 MCG tablet Commonly known as: SYNTHROID Take 1 tablet (75 mcg total) by mouth daily before breakfast.   lidocaine 5 % Commonly known as: LIDODERM Place 2 patches onto the skin daily. Remove & Discard patch within 12 hours or as directed by MD   memantine 28 MG Cp24 24 hr capsule Commonly known as: NAMENDA XR Take 28 mg by mouth daily.   metFORMIN 1000 MG tablet Commonly known as: GLUCOPHAGE Take 1,000 mg by mouth 2 (two) times daily with a meal.   metoprolol succinate 50 MG 24 hr tablet Commonly known as: TOPROL-XL Take 50 mg by mouth daily. Take with or immediately following a meal.   midodrine 10 MG tablet Commonly known as: PROAMATINE Take 1 tablet (10 mg  total) by mouth 3 (three) times daily with meals.   montelukast 10 MG tablet Commonly known as: SINGULAIR SMARTSIG:1 Tablet(s) By Mouth Every Evening   NovoLOG FlexPen 100 UNIT/ML FlexPen Generic drug: insulin aspart SMARTSIG:15 Unit(s) SUB-Q Daily   Oscal 500/200 D-3 500-5 MG-MCG Tabs Generic drug: Calcium Carbonate-Vitamin D Take 1 tablet by mouth 2 (two) times daily.   oxyCODONE-acetaminophen 5-325 MG tablet Commonly known as: PERCOCET/ROXICET Take 1-2 tablets by mouth every 8 (eight) hours as needed for moderate pain or severe pain. Replaces: oxyCODONE-acetaminophen 10-325 MG tablet   pantoprazole 40 MG tablet Commonly known as: PROTONIX Take 40 mg by mouth daily as needed (heartburn).   pioglitazone 15 MG tablet Commonly known as: ACTOS Take 15 mg by mouth daily.   tolterodine 2 MG 24 hr capsule Commonly known as: DETROL LA Take 2 mg by mouth daily.   topiramate 200 MG tablet  Commonly known as: TOPAMAX Take 200 mg by mouth at bedtime.   Trulicity 1.5 TF/5.7DU Sopn Generic drug: Dulaglutide Inject 1.5 mg into the skin once a week.   Viibryd 40 MG Tabs Generic drug: Vilazodone HCl Take 40 mg by mouth daily.        Contact information for follow-up providers     Ziglar, Lincoln Brigham, MD. Schedule an appointment as soon as possible for a visit in 1 week(s).   Specialty: Family Medicine Contact information: [place] [address] [phone]              Contact information for after-discharge care     Destination     [facility] .   Service: Skilled Nursing Contact information: [address] [place] [phone]                     Discharge Exam: Danley Danker Weights   12/29/21 1701 12/30/21 0500 12/31/21 0354  Weight: 112.9 kg 113.3 kg 112 kg   General.     In no acute distress. Pulmonary.  Lungs clear bilaterally, normal respiratory effort. CV.  Regular rate and  rhythm, no JVD, rub or murmur. Abdomen.  Soft, nontender, nondistended, BS positive. CNS.  Alert and oriented x3.  No focal neurologic deficit. Extremities.  No edema, no cyanosis, pulses intact and symmetrical. Psychiatry.  Judgment and insight appears normal.   Condition at discharge: stable  The results of significant diagnostics from this hospitalization (including imaging, microbiology, ancillary and laboratory) are listed below for reference.   Imaging Studies: DG Chest 2 View  Result Date: 12/28/2021 CLINICAL DATA:  Chest pain, weakness, back pain. Shortness of breath. Bilateral lower extremity edema. History of CHF. EXAM: CHEST - 2 VIEW COMPARISON:  CT examination dated Mar 29, 2021 FINDINGS: The heart size and mediastinal contours are within normal limits. Atherosclerotic calcification of the aortic arch. Low lung volumes with bibasilar atelectasis. No focal consolidation or pleural effusion. Calcified left hilar lymph node. The visualized skeletal structures are unremarkable. IMPRESSION: Low lung volumes without evidence of acute cardiopulmonary process. Electronically Signed   By: Keane Police D.O.   On: 12/28/2021 16:35   NM Pulmonary Perfusion  Result Date: 12/29/2021 CLINICAL DATA:  Short of breath for a few days.  History of COPD. EXAM: NUCLEAR MEDICINE PERFUSION LUNG SCAN TECHNIQUE: Perfusion images were obtained in multiple projections after intravenous injection of radiopharmaceutical. Ventilation scans intentionally deferred if perfusion scan and chest x-ray adequate for interpretation during COVID 19 epidemic. RADIOPHARMACEUTICALS:  4.2 mCi Tc-36m MAA IV COMPARISON:  Chest radiographs, 12/28/2021. FINDINGS: There is homogeneous, normal perfusion to both lungs.  No defects. IMPRESSION: Normal exam.  No evidence of a pulmonary thromboembolism. Electronically Signed   By: Lajean Manes M.D.   On: 12/29/2021 11:33   US Venous Img Lower Bilateral  Result Date: 12/28/2021 CLINICAL  DATA:  Lower extremity pain and edema EXAM: BILATERAL LOWER EXTREMITY VENOUS DOPPLER ULTRASOUND TECHNIQUE: Gray-scale sonography with graded compression, as well as color Doppler and duplex ultrasound were performed to evaluate the lower extremity deep venous systems from the level of the common femoral vein and including the common femoral, femoral, profunda femoral, popliteal and calf veins including the posterior tibial, peroneal and gastrocnemius veins when visible. The superficial great saphenous vein was also interrogated. Spectral Doppler was utilized to evaluate flow at rest and with distal augmentation maneuvers in the common femoral, femoral and popliteal veins. COMPARISON:  08/17/2009 FINDINGS: RIGHT LOWER EXTREMITY Common Femoral Vein: No evidence of thrombus. Normal compressibility, respiratory phasicity and response to augmentation. Saphenofemoral Junction: No evidence of thrombus. Normal compressibility and flow on color Doppler imaging. Profunda Femoral Vein: No evidence of thrombus. Normal compressibility and flow on color Doppler imaging. Femoral Vein: No evidence of thrombus. Normal compressibility, respiratory phasicity and response to augmentation. Popliteal Vein: No evidence of thrombus. Normal compressibility, respiratory phasicity and response to augmentation. Calf Veins: No evidence of thrombus. Normal compressibility and flow on color Doppler imaging. Superficial Great Saphenous Vein: No evidence of thrombus. Normal compressibility. Venous Reflux:  None. Other Findings:  None. LEFT LOWER EXTREMITY Common Femoral Vein: No evidence of thrombus. Normal compressibility, respiratory phasicity and response to augmentation. Saphenofemoral Junction: No evidence of thrombus. Normal compressibility and flow on color Doppler imaging. Profunda Femoral Vein: No evidence of thrombus. Normal compressibility and flow on color Doppler imaging. Femoral Vein: No evidence of thrombus. Normal compressibility,  respiratory phasicity and response to augmentation. Popliteal Vein: No evidence of thrombus. Normal compressibility, respiratory phasicity and response to augmentation. Calf Veins: No evidence of thrombus. Normal compressibility and flow on color Doppler imaging. Superficial Great Saphenous Vein: No evidence of thrombus. Normal compressibility. Venous Reflux:  None. Other Findings:  None. IMPRESSION: No evidence of deep venous thrombosis in either lower extremity. Electronically Signed   By: Davina Poke D.O.   On: 12/28/2021 19:07   ECHOCARDIOGRAM COMPLETE  Result Date: 12/30/2021    ECHOCARDIOGRAM REPORT   Patient Name:   LINNET BOTTARI Date of Exam: 12/30/2021 Medical Rec #:  [id number]       Height:       63.0 in Accession #:    [id number]      Weight:       249.8 lb Date of Birth:  1946-07-14        BSA:          2.125 m Patient Age:    76 years        BP:           101/50 mmHg Patient Gender: F               HR:           78 bpm. Exam Location:  [facility] Procedure: 2D Echo and Intracardiac Opacification Agent Indications:     Dyspnea R06.00                  Chest Pain R07.9  History:         Patient has no prior history of Echocardiogram examinations.  Sonographer:     Kathlen Brunswick RDCS Referring Phys:  [id number] AMY N COX Diagnosing Phys: Yolonda Kida MD  Sonographer Comments: Technically difficult study due to poor echo windows. Image acquisition challenging due to patient body habitus. IMPRESSIONS  1. Left ventricular ejection fraction, by estimation, is 55 to 60%. The left ventricle has normal function. The left ventricle has no regional wall motion abnormalities. Left ventricular diastolic parameters are consistent with Grade I diastolic dysfunction (impaired relaxation).  2. Right ventricular systolic function is normal. The right ventricular size is normal.  3. The mitral valve is normal in structure. Trivial mitral valve regurgitation.  4. The aortic valve is normal in structure. Aortic valve  regurgitation is not visualized. FINDINGS  Left Ventricle: Left ventricular ejection fraction, by estimation, is 55 to 60%. The left ventricle has normal function. The left ventricle has no regional wall motion abnormalities. Definity  contrast agent was given IV to delineate the left ventricular  endocardial borders. The left ventricular internal cavity size was normal in size. There is no left ventricular hypertrophy. Left ventricular diastolic parameters are consistent with Grade I diastolic dysfunction (impaired relaxation). Right Ventricle: The right ventricular size is normal. No increase in right ventricular wall thickness. Right ventricular systolic function is normal. Left Atrium: Left atrial size was normal in size. Right Atrium: Right atrial size was normal in size. Pericardium: There is no evidence of pericardial effusion. Mitral Valve: The mitral valve is normal in structure. Trivial mitral valve regurgitation. Tricuspid Valve: The tricuspid valve is normal in structure. Tricuspid valve regurgitation is trivial. Aortic Valve: The aortic valve is normal in structure. Aortic valve regurgitation is not visualized. Aortic valve peak gradient measures 7.2 mmHg. Pulmonic Valve: The pulmonic valve was normal in structure. Pulmonic valve regurgitation is not visualized. Aorta: The ascending aorta was not well visualized. IAS/Shunts: No atrial level shunt detected by color flow Doppler.  LEFT VENTRICLE PLAX 2D LVIDd:         4.30 cm     Diastology LVIDs:         3.00 cm     LV e' medial:    5.22 cm/s LV PW:         1.20 cm     LV E/e' medial:  14.7 LV IVS:        1.05 cm     LV e' lateral:   6.85 cm/s LVOT diam:     2.00 cm     LV E/e' lateral: 11.2 LV SV:         72 LV SV Index:   34 LVOT Area:     3.14 cm  LV Volumes (MOD) LV vol d, MOD A2C: 52.4 ml LV vol d, MOD A4C: 67.2 ml LV vol s, MOD A2C: 17.9 ml LV vol s, MOD A4C: 22.9 ml LV SV MOD A2C:     34.5 ml LV SV MOD A4C:     67.2 ml LV SV MOD BP:      39.4 ml  RIGHT VENTRICLE RV Basal diam:  2.70 cm RV S prime:     13.60 cm/s LEFT ATRIUM           Index        RIGHT ATRIUM          Index LA diam:      3.50 cm 1.65 cm/m   RA Area:     9.58 cm LA Vol (A2C): 20.6 ml 9.69 ml/m   RA Volume:   18.60 ml 8.75 ml/m LA Vol (A4C): 48.7 ml 22.92 ml/m  AORTIC VALVE                 PULMONIC VALVE AV Area (Vmax): 2.84 cm     PV Vmax:       0.83 m/s AV Vmax:        134.00 cm/s  PV Peak grad:  2.7 mmHg AV Peak Grad:   7.2 mmHg LVOT Vmax:      121.00 cm/s LVOT Vmean:     82.700 cm/s LVOT VTI:       0.229 m  AORTA Ao Root diam: 3.20 cm Ao Asc diam:  3.30 cm MITRAL VALVE MV Area (PHT): 4.04 cm     SHUNTS MV Decel Time: 188 msec     Systemic VTI:  0.23 m MV E velocity: 76.70 cm/s   Systemic Diam: 2.00 cm MV  A velocity: 107.00 cm/s MV E/A ratio:  0.72 Yolonda Kida MD Electronically signed by Yolonda Kida MD Signature Date/Time: 12/30/2021/2:17:37 PM    Final     Microbiology: Results for orders placed or performed during the hospital encounter of 12/28/21  Resp Panel by RT-PCR (Flu A&B, Covid) Nasopharyngeal Swab     Status: None   Collection Time: 12/28/21  6:37 PM   Specimen: Nasopharyngeal Swab; Nasopharyngeal(NP) swabs in vial transport medium  Result Value Ref Range Status   SARS Coronavirus 2 by RT PCR NEGATIVE NEGATIVE Final    Comment: (NOTE) SARS-CoV-2 target nucleic acids are NOT DETECTED.  The SARS-CoV-2 RNA is generally detectable in upper respiratory specimens during the acute phase of infection. The lowest concentration of SARS-CoV-2 viral copies this assay can detect is 138 copies/mL. A negative result does not preclude SARS-Cov-2 infection and should not be used as the sole basis for treatment or other patient management decisions. A negative result may occur with  improper specimen collection/handling, submission of specimen other than nasopharyngeal swab, presence of viral mutation(s) within the areas targeted by this assay, and inadequate  number of viral copies(<138 copies/mL). A negative result must be combined with clinical observations, patient history, and epidemiological information. The expected result is Negative.  Fact Sheet for Patients:  EntrepreneurPulse.com.au  Fact Sheet for Healthcare Providers:  IncredibleEmployment.be  This test is no t yet approved or cleared by the [place] FDA and  has been authorized for detection and/or diagnosis of SARS-CoV-2 by FDA under an Emergency Use Authorization (EUA). This EUA will remain  in effect (meaning this test can be used) for the duration of the COVID-19 declaration under Section 564(b)(1) of the Act, 21 U.S.C.section 360bbb-3(b)(1), unless the authorization is terminated  or revoked sooner.       Influenza A by PCR NEGATIVE NEGATIVE Final   Influenza B by PCR NEGATIVE NEGATIVE Final    Comment: (NOTE) The Xpert Xpress SARS-CoV-2/FLU/RSV plus assay is intended as an aid in the diagnosis of influenza from Nasopharyngeal swab specimens and should not be used as a sole basis for treatment. Nasal washings and aspirates are unacceptable for Xpert Xpress SARS-CoV-2/FLU/RSV testing.  Fact Sheet for Patients: EntrepreneurPulse.com.au  Fact Sheet for Healthcare Providers: IncredibleEmployment.be  This test is not yet approved or cleared by the [place] FDA and has been authorized for detection and/or diagnosis of SARS-CoV-2 by FDA under an Emergency Use Authorization (EUA). This EUA will remain in effect (meaning this test can be used) for the duration of the COVID-19 declaration under Section 564(b)(1) of the Act, 21 U.S.C. section 360bbb-3(b)(1), unless the authorization is terminated or revoked.  Performed at [facility], [address], [place], [place] [address]   Urine Culture     Status: Abnormal   Collection Time: 12/29/21  2:25 PM   Specimen: Urine, Random   Result Value Ref Range Status   Specimen Description   Final    URINE, RANDOM Performed at [facility], [address] [place], [place] [address]    Special Requests   Final    NONE Performed at [facility], [address] [place], [place] [address]    Culture (A)  Final    >=100,000 COLONIES/mL ESCHERICHIA COLI 60,000 COLONIES/mL STREPTOCOCCUS AGALACTIAE TESTING AGAINST S. AGALACTIAE NOT ROUTINELY PERFORMED DUE TO PREDICTABILITY OF AMP/PEN/VAN SUSCEPTIBILITY. Performed at [facility], [place] [address] [place], [place] [address]    Report Status 12/31/2021 FINAL  Final  Organism ID, Bacteria ESCHERICHIA COLI (A)  Final      Susceptibility   Escherichia coli - MIC*    AMPICILLIN >=32 RESISTANT Resistant     CEFAZOLIN <=4 SENSITIVE Sensitive     CEFEPIME <=0.12 SENSITIVE Sensitive     CEFTRIAXONE <=0.25 SENSITIVE Sensitive     CIPROFLOXACIN <=0.25 SENSITIVE Sensitive     GENTAMICIN <=1 SENSITIVE Sensitive     IMIPENEM <=0.25 SENSITIVE Sensitive     NITROFURANTOIN <=16 SENSITIVE Sensitive     TRIMETH/SULFA <=20 SENSITIVE Sensitive     AMPICILLIN/SULBACTAM 16 INTERMEDIATE Intermediate     PIP/TAZO <=4 SENSITIVE Sensitive     * >=100,000 COLONIES/mL ESCHERICHIA COLI    Labs: CBC: Recent Labs  Lab 12/28/21 1553 12/29/21 0700 12/30/21 0514 01/01/22 0423  WBC 6.1 7.6 7.0 8.5  HGB 10.8* 10.7* 10.6* 10.2*  HCT 35.1* 34.8* 34.1* 32.3*  MCV 96.2 95.1 94.7 94.2  PLT 173 163 156 468   Basic Metabolic Panel: Recent Labs  Lab 12/28/21 1553 12/29/21 0830 12/30/21 0514 12/31/21 0630 01/01/22 0423  NA 135 136 139 135 140  K 5.5* 4.2 3.8 4.6 4.2  CL 101 101 107 104 110  CO2 24 25 25 22 22   GLUCOSE 261* 123* 65* 233* 123*  BUN 65* 58* 43* 49* 56*  CREATININE 1.95* 1.72* 1.13* 1.77* 1.37*  CALCIUM 8.8* 9.0 9.0 8.6* 8.7*  MG  --   --   --   --  1.9  PHOS  --   --   --   --  3.5   Liver Function Tests: Recent Labs  Lab 12/31/21 0630   ALBUMIN 2.9*   CBG: Recent Labs  Lab 12/31/21 1258 12/31/21 1653 12/31/21 2143 01/01/22 0741 01/01/22 1130  GLUCAP 164* 159* 154* 95 139*    Discharge time spent: greater than 30 minutes.  This record has been created using Systems analyst. Errors have been sought and corrected,but may not always be located. Such creation errors do not reflect on the standard of care.   Signed: Lorella Nimrod, MD [facility] 01/01/2022

## 2022-01-01 NOTE — Progress Notes (Signed)
Attempted to call reports to [facility] without successful. Patient is being transported via EMS.

## 2022-01-01 NOTE — Plan of Care (Signed)

## 2022-01-01 NOTE — Care Management Important Message (Signed)
Important Message  Patient Details  Name: [name] MRN: [id number] Date of Birth: May 11, 1946   Medicare Important Message Given:  N/A - LOS <3 / Initial given by admissions     Dannette Barbara 01/01/2022, 1:10 PM

## 2022-01-03 ENCOUNTER — Encounter: Payer: Self-pay | Admitting: Oncology

## 2022-01-03 ENCOUNTER — Telehealth: Payer: Self-pay | Admitting: *Deleted

## 2022-01-03 NOTE — Telephone Encounter (Signed)
Patient would like her B12 injection appointment to be moved up to next week. ?

## 2022-01-03 NOTE — Telephone Encounter (Addendum)
Patient has been non-compliant with injection appts, she missed her inj appt last week.  ? ?[name], will you call pt to r/s B12 inj from 3/27 to next week. Also appt in April will need to be moved up to be a month apart from new inj date. Please let her know that she missed B12 inj last week.  ? ?Keep appts in May as scheduled.  ?

## 2022-01-05 LAB — CULTURE, BLOOD (ROUTINE X 2)
Culture: NO GROWTH
Culture: NO GROWTH
Special Requests: ADEQUATE

## 2022-01-07 ENCOUNTER — Encounter: Payer: Self-pay | Admitting: Oncology

## 2022-01-11 ENCOUNTER — Inpatient Hospital Stay: Payer: Medicare Other

## 2022-01-28 ENCOUNTER — Inpatient Hospital Stay: Payer: Medicare Other

## 2022-02-08 IMAGING — DX DG KNEE COMPLETE 4+V*R*
4 series · 4 of 4 positions shown · non-contrast
Comparison: X-ray right knee 06/30/2016

CLINICAL DATA: Knee pain. Bruising and abrasions to the right knee.

EXAM:
RIGHT KNEE - COMPLETE 4+ VIEW

[knee ap]
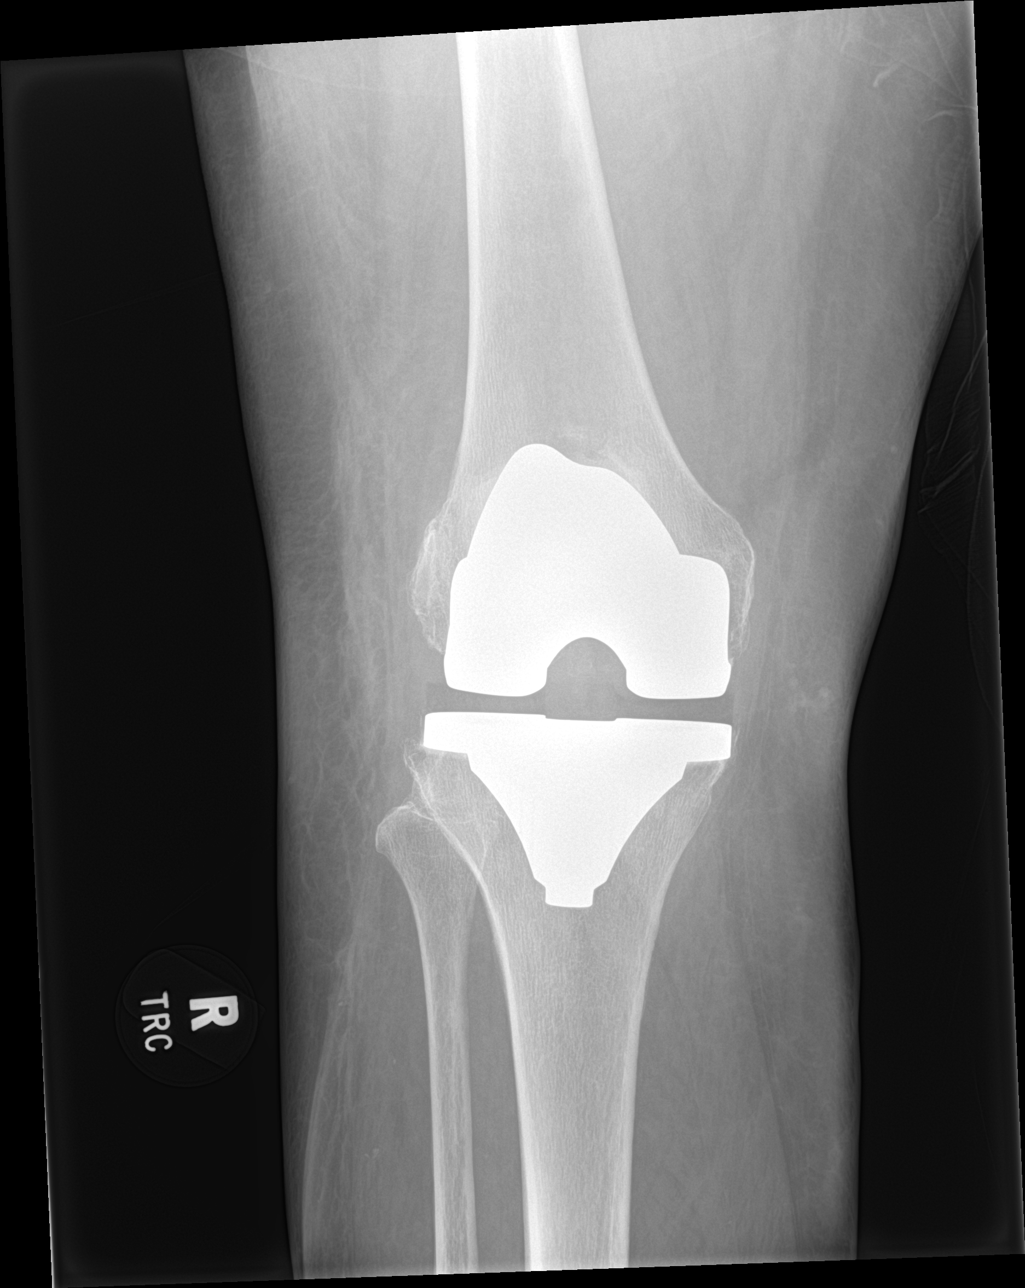

[knee obl (1 of 2)]
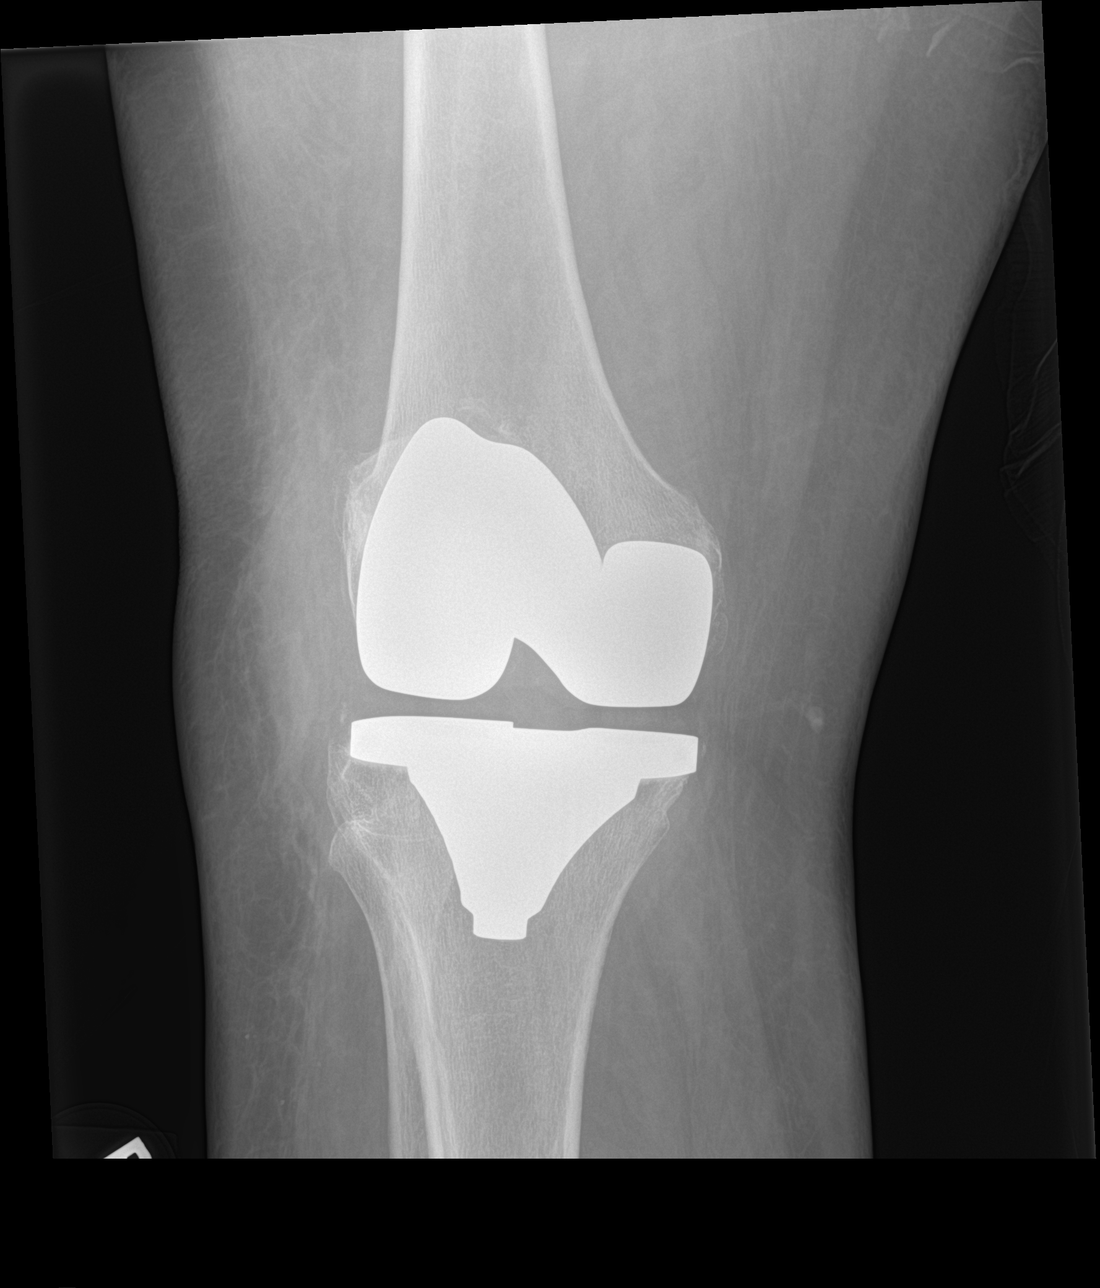

[knee obl (2 of 2)]
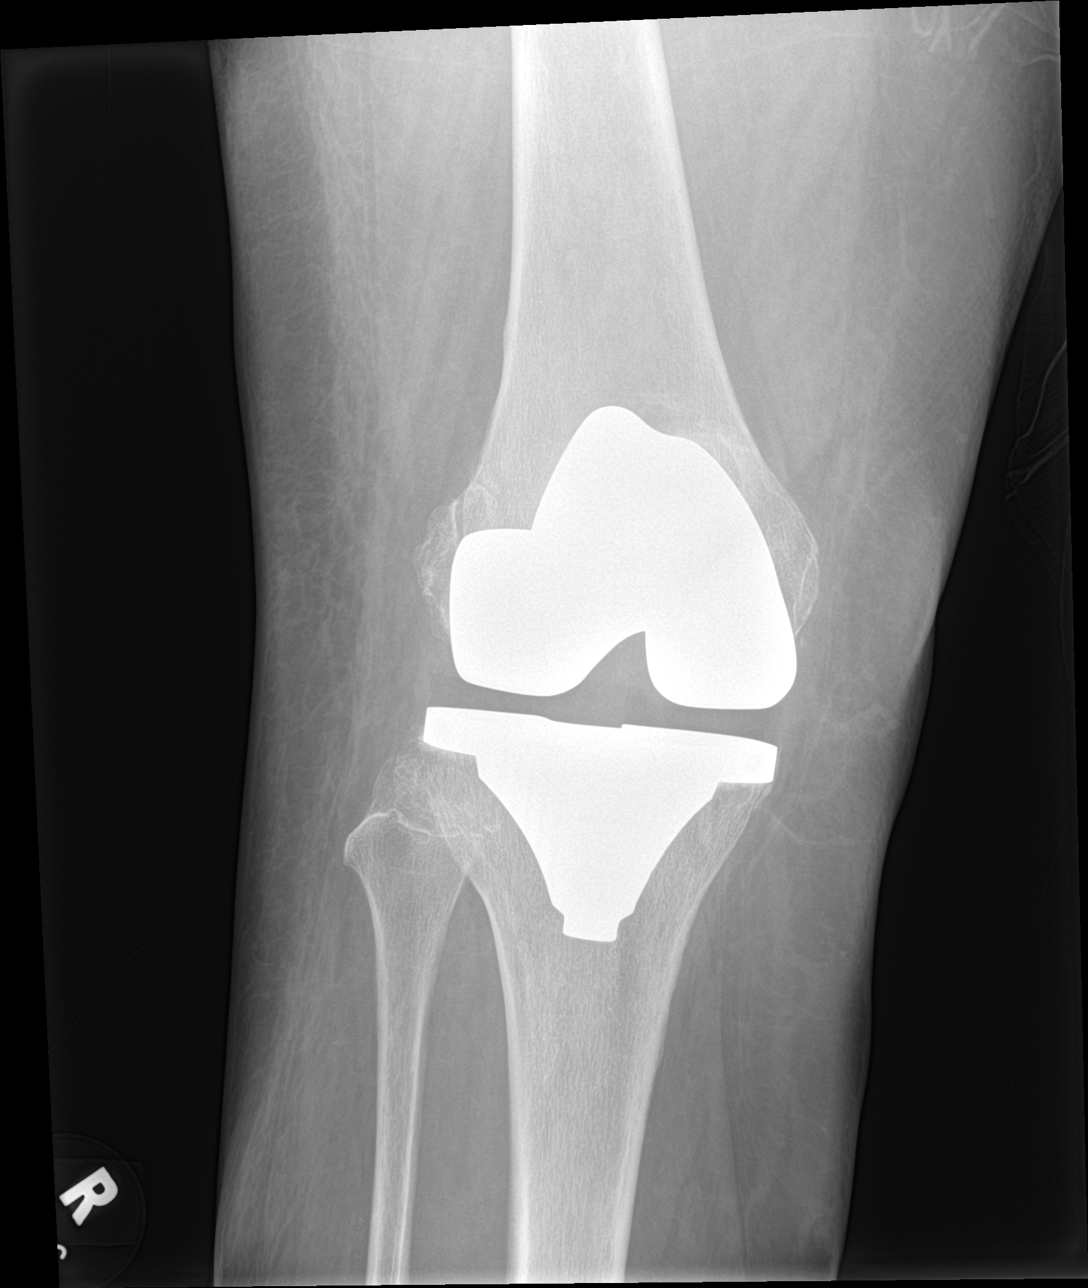

[knee lat]
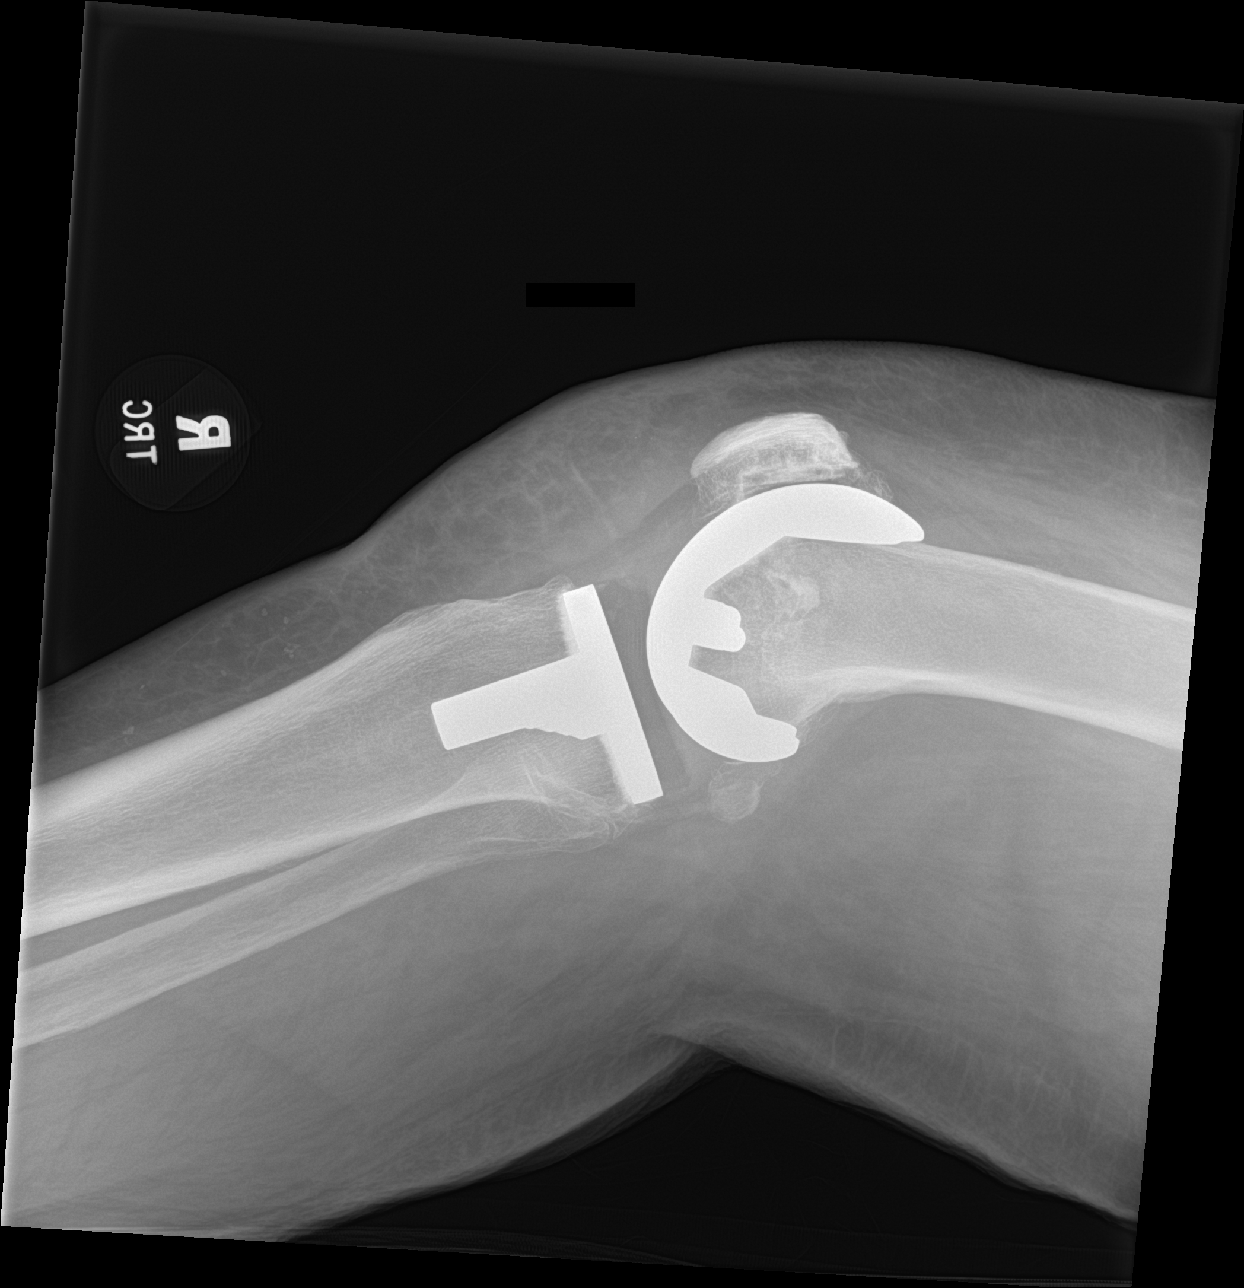

[4 of 4 positions shown; findings below may reference images not displayed]

FINDINGS: Total right knee arthroplasty appear no evidence of fracture,
dislocation, or joint effusion. No evidence of arthropathy or other
focal bone abnormality. Subcutaneus soft tissue edema. Likely dermal
calcifications.
IMPRESSION: 1. No acute displaced fracture or dislocation.
2. Subcutaneus soft tissue edema.

## 2022-02-15 ENCOUNTER — Inpatient Hospital Stay: Payer: Medicare Other | Attending: Family Medicine

## 2022-02-25 ENCOUNTER — Inpatient Hospital Stay: Payer: Medicare Other

## 2022-03-18 ENCOUNTER — Inpatient Hospital Stay: Payer: Medicare Other | Attending: Oncology

## 2022-03-19 ENCOUNTER — Other Ambulatory Visit: Payer: Medicare Other

## 2022-03-19 ENCOUNTER — Inpatient Hospital Stay: Payer: Medicare Other

## 2022-03-19 ENCOUNTER — Encounter: Payer: Self-pay | Admitting: Oncology

## 2022-03-19 ENCOUNTER — Inpatient Hospital Stay: Payer: Medicare Other | Admitting: Oncology

## 2022-07-03 ENCOUNTER — Other Ambulatory Visit: Payer: Self-pay | Admitting: Family Medicine

## 2022-07-03 ENCOUNTER — Other Ambulatory Visit: Payer: Self-pay | Admitting: Surgery

## 2022-07-03 ENCOUNTER — Ambulatory Visit: Payer: Medicare Other

## 2022-07-03 DIAGNOSIS — R609 Edema, unspecified: Secondary | ICD-10-CM

## 2022-07-24 ENCOUNTER — Ambulatory Visit
Admission: RE | Admit: 2022-07-24 | Discharge: 2022-07-24 | Disposition: A | Discharge status: Home / Self Care | Payer: Medicare Other | Source: Ambulatory Visit | Attending: Family Medicine | Admitting: Family Medicine

## 2022-07-24 DIAGNOSIS — R609 Edema, unspecified: Secondary | ICD-10-CM | POA: Diagnosis present

## 2023-06-02 ENCOUNTER — Encounter: Payer: Self-pay | Admitting: Oncology

## 2023-06-10 ENCOUNTER — Ambulatory Visit: Payer: Medicare Other | Admitting: Family

## 2023-07-01 ENCOUNTER — Ambulatory Visit: Payer: Medicare Other | Admitting: Family

## 2023-09-12 ENCOUNTER — Emergency Department (HOSPITAL_COMMUNITY): Payer: Medicare Other

## 2023-09-12 ENCOUNTER — Encounter (HOSPITAL_COMMUNITY): Payer: Self-pay

## 2023-09-12 ENCOUNTER — Inpatient Hospital Stay (HOSPITAL_COMMUNITY)
Admission: EM | Admit: 2023-09-12 | Discharge: 2023-09-16 | DRG: 202 | Disposition: A | Discharge status: Home / Self Care | Payer: Medicare Other | Attending: Internal Medicine | Admitting: Internal Medicine

## 2023-09-12 ENCOUNTER — Other Ambulatory Visit: Payer: Self-pay

## 2023-09-12 ENCOUNTER — Encounter: Payer: Self-pay | Admitting: Oncology

## 2023-09-12 DIAGNOSIS — E878 Other disorders of electrolyte and fluid balance, not elsewhere classified: Secondary | ICD-10-CM | POA: Diagnosis present

## 2023-09-12 DIAGNOSIS — J9601 Acute respiratory failure with hypoxia: Secondary | ICD-10-CM | POA: Diagnosis present

## 2023-09-12 DIAGNOSIS — Z7984 Long term (current) use of oral hypoglycemic drugs: Secondary | ICD-10-CM

## 2023-09-12 DIAGNOSIS — N3000 Acute cystitis without hematuria: Secondary | ICD-10-CM | POA: Diagnosis present

## 2023-09-12 DIAGNOSIS — J40 Bronchitis, not specified as acute or chronic: Secondary | ICD-10-CM | POA: Diagnosis present

## 2023-09-12 DIAGNOSIS — E1122 Type 2 diabetes mellitus with diabetic chronic kidney disease: Secondary | ICD-10-CM | POA: Diagnosis present

## 2023-09-12 DIAGNOSIS — I130 Hypertensive heart and chronic kidney disease with heart failure and stage 1 through stage 4 chronic kidney disease, or unspecified chronic kidney disease: Secondary | ICD-10-CM | POA: Diagnosis present

## 2023-09-12 DIAGNOSIS — R6510 Systemic inflammatory response syndrome (SIRS) of non-infectious origin without acute organ dysfunction: Secondary | ICD-10-CM

## 2023-09-12 DIAGNOSIS — Z794 Long term (current) use of insulin: Secondary | ICD-10-CM

## 2023-09-12 DIAGNOSIS — E039 Hypothyroidism, unspecified: Secondary | ICD-10-CM | POA: Diagnosis present

## 2023-09-12 DIAGNOSIS — R0902 Hypoxemia: Principal | ICD-10-CM

## 2023-09-12 DIAGNOSIS — Z79890 Hormone replacement therapy (postmenopausal): Secondary | ICD-10-CM

## 2023-09-12 DIAGNOSIS — Z79891 Long term (current) use of opiate analgesic: Secondary | ICD-10-CM

## 2023-09-12 DIAGNOSIS — E1140 Type 2 diabetes mellitus with diabetic neuropathy, unspecified: Secondary | ICD-10-CM | POA: Diagnosis present

## 2023-09-12 DIAGNOSIS — I2510 Atherosclerotic heart disease of native coronary artery without angina pectoris: Secondary | ICD-10-CM | POA: Diagnosis present

## 2023-09-12 DIAGNOSIS — Z955 Presence of coronary angioplasty implant and graft: Secondary | ICD-10-CM

## 2023-09-12 DIAGNOSIS — R230 Cyanosis: Secondary | ICD-10-CM | POA: Diagnosis present

## 2023-09-12 DIAGNOSIS — J9809 Other diseases of bronchus, not elsewhere classified: Principal | ICD-10-CM | POA: Diagnosis present

## 2023-09-12 DIAGNOSIS — J398 Other specified diseases of upper respiratory tract: Secondary | ICD-10-CM

## 2023-09-12 DIAGNOSIS — D631 Anemia in chronic kidney disease: Secondary | ICD-10-CM | POA: Diagnosis present

## 2023-09-12 DIAGNOSIS — R7989 Other specified abnormal findings of blood chemistry: Secondary | ICD-10-CM

## 2023-09-12 DIAGNOSIS — I959 Hypotension, unspecified: Secondary | ICD-10-CM | POA: Diagnosis present

## 2023-09-12 DIAGNOSIS — E785 Hyperlipidemia, unspecified: Secondary | ICD-10-CM | POA: Diagnosis present

## 2023-09-12 DIAGNOSIS — Z7985 Long-term (current) use of injectable non-insulin antidiabetic drugs: Secondary | ICD-10-CM

## 2023-09-12 DIAGNOSIS — I5032 Chronic diastolic (congestive) heart failure: Secondary | ICD-10-CM | POA: Diagnosis present

## 2023-09-12 DIAGNOSIS — Z1152 Encounter for screening for COVID-19: Secondary | ICD-10-CM

## 2023-09-12 DIAGNOSIS — E669 Obesity, unspecified: Secondary | ICD-10-CM | POA: Diagnosis present

## 2023-09-12 DIAGNOSIS — N179 Acute kidney failure, unspecified: Secondary | ICD-10-CM | POA: Diagnosis present

## 2023-09-12 DIAGNOSIS — E1165 Type 2 diabetes mellitus with hyperglycemia: Secondary | ICD-10-CM | POA: Diagnosis present

## 2023-09-12 DIAGNOSIS — G894 Chronic pain syndrome: Secondary | ICD-10-CM | POA: Diagnosis present

## 2023-09-12 DIAGNOSIS — K219 Gastro-esophageal reflux disease without esophagitis: Secondary | ICD-10-CM | POA: Diagnosis present

## 2023-09-12 DIAGNOSIS — E1169 Type 2 diabetes mellitus with other specified complication: Secondary | ICD-10-CM | POA: Diagnosis present

## 2023-09-12 DIAGNOSIS — N1831 Chronic kidney disease, stage 3a: Secondary | ICD-10-CM | POA: Diagnosis present

## 2023-09-12 DIAGNOSIS — D649 Anemia, unspecified: Secondary | ICD-10-CM | POA: Diagnosis present

## 2023-09-12 DIAGNOSIS — J9811 Atelectasis: Secondary | ICD-10-CM | POA: Diagnosis present

## 2023-09-12 DIAGNOSIS — E8729 Other acidosis: Secondary | ICD-10-CM | POA: Diagnosis present

## 2023-09-12 DIAGNOSIS — N183 Chronic kidney disease, stage 3 unspecified: Secondary | ICD-10-CM | POA: Insufficient documentation

## 2023-09-12 DIAGNOSIS — I10 Essential (primary) hypertension: Secondary | ICD-10-CM | POA: Diagnosis present

## 2023-09-12 DIAGNOSIS — Z7982 Long term (current) use of aspirin: Secondary | ICD-10-CM

## 2023-09-12 DIAGNOSIS — F411 Generalized anxiety disorder: Secondary | ICD-10-CM | POA: Diagnosis present

## 2023-09-12 DIAGNOSIS — Z79899 Other long term (current) drug therapy: Secondary | ICD-10-CM

## 2023-09-12 DIAGNOSIS — E871 Hypo-osmolality and hyponatremia: Secondary | ICD-10-CM | POA: Diagnosis present

## 2023-09-12 DIAGNOSIS — E119 Type 2 diabetes mellitus without complications: Secondary | ICD-10-CM

## 2023-09-12 DIAGNOSIS — F32A Depression, unspecified: Secondary | ICD-10-CM | POA: Diagnosis present

## 2023-09-12 DIAGNOSIS — Z23 Encounter for immunization: Secondary | ICD-10-CM

## 2023-09-12 LAB — CBC WITH DIFFERENTIAL/PLATELET
Abs Immature Granulocytes: 0.05 10*3/uL (ref 0.00–0.07)
Basophils Absolute: 0.1 10*3/uL (ref 0.0–0.1)
Basophils Relative: 1 %
Eosinophils Absolute: 0.2 10*3/uL (ref 0.0–0.5)
Eosinophils Relative: 2 %
HCT: 42.7 % (ref 36.0–46.0)
Hemoglobin: 13.6 g/dL (ref 12.0–15.0)
Immature Granulocytes: 1 %
Lymphocytes Relative: 33 %
Lymphs Abs: 2.7 10*3/uL (ref 0.7–4.0)
MCH: 29 pg (ref 26.0–34.0)
MCHC: 31.9 g/dL (ref 30.0–36.0)
MCV: 91 fL (ref 80.0–100.0)
Monocytes Absolute: 0.8 10*3/uL (ref 0.1–1.0)
Monocytes Relative: 9 %
Neutro Abs: 4.6 10*3/uL (ref 1.7–7.7)
Neutrophils Relative %: 54 %
Platelets: 142 10*3/uL — ABNORMAL LOW (ref 150–400)
RBC: 4.69 MIL/uL (ref 3.87–5.11)
RDW: 13.4 % (ref 11.5–15.5)
WBC: 8.3 10*3/uL (ref 4.0–10.5)
nRBC: 0 % (ref 0.0–0.2)

## 2023-09-12 LAB — COMPREHENSIVE METABOLIC PANEL
ALT: 28 U/L (ref 0–44)
AST: 37 U/L (ref 15–41)
Albumin: 3.5 g/dL (ref 3.5–5.0)
Alkaline Phosphatase: 64 U/L (ref 38–126)
Anion gap: 12 (ref 5–15)
BUN: 26 mg/dL — ABNORMAL HIGH (ref 8–23)
CO2: 25 mmol/L (ref 22–32)
Calcium: 9.3 mg/dL (ref 8.9–10.3)
Chloride: 96 mmol/L — ABNORMAL LOW (ref 98–111)
Creatinine, Ser: 1.7 mg/dL — ABNORMAL HIGH (ref 0.44–1.00)
GFR, Estimated: 31 mL/min — ABNORMAL LOW (ref >60)
Glucose, Bld: 195 mg/dL — ABNORMAL HIGH (ref 70–99)
Potassium: 4.4 mmol/L (ref 3.5–5.1)
Sodium: 133 mmol/L — ABNORMAL LOW (ref 135–145)
Total Bilirubin: 0.7 mg/dL (ref <1.2)
Total Protein: 7 g/dL (ref 6.5–8.1)

## 2023-09-12 LAB — URINALYSIS, W/ REFLEX TO CULTURE (INFECTION SUSPECTED)
Bilirubin Urine: NEGATIVE
Glucose, UA: NEGATIVE mg/dL
Hgb urine dipstick: NEGATIVE
Ketones, ur: NEGATIVE mg/dL
Nitrite: NEGATIVE
Protein, ur: NEGATIVE mg/dL
Specific Gravity, Urine: 1.021 (ref 1.005–1.030)
pH: 5 (ref 5.0–8.0)

## 2023-09-12 LAB — BRAIN NATRIURETIC PEPTIDE: B Natriuretic Peptide: 19.9 pg/mL (ref 0.0–100.0)

## 2023-09-12 LAB — TROPONIN I (HIGH SENSITIVITY)
Troponin I (High Sensitivity): 6 ng/L (ref <18)
Troponin I (High Sensitivity): 6 ng/L (ref <18)

## 2023-09-12 LAB — D-DIMER, QUANTITATIVE: D-Dimer, Quant: 0.59 ug{FEU}/mL — ABNORMAL HIGH (ref 0.00–0.50)

## 2023-09-12 MED ORDER — IOHEXOL 350 MG/ML SOLN
60.0000 mL | Freq: Once | INTRAVENOUS | Status: AC | PRN
  Administered 2023-09-12: 60 mL via INTRAVENOUS

## 2023-09-12 MED ORDER — SODIUM CHLORIDE 0.9 % IV BOLUS
1000.0000 mL | Freq: Once | INTRAVENOUS | Status: AC
  Administered 2023-09-12: 1000 mL via INTRAVENOUS

## 2023-09-12 MED ORDER — LACTATED RINGERS IV BOLUS
500.0000 mL | Freq: Once | INTRAVENOUS | Status: AC
  Administered 2023-09-13: 500 mL via INTRAVENOUS

## 2023-09-12 MED ORDER — SODIUM CHLORIDE 0.9 % IV SOLN
1.0000 g | Freq: Once | INTRAVENOUS | Status: AC
  Administered 2023-09-12: 1 g via INTRAVENOUS
  Filled 2023-09-12: qty 10

## 2023-09-12 NOTE — ED Notes (Signed)
Xray at beside

## 2023-09-12 NOTE — ED Triage Notes (Signed)
Pt BIB [facility] from [facility] where she was there to get her pain prescription refilled. While there she could not urinate & then after talking with provider told them she has also been SOB for 2 weeks & retaining fluid. Her RA in the office was in the 80's & only 88% on 4L O2, then 96% on NRB. EMS reports her lips were blue when she transferred to their stretcher from the office chair, rales in lower lobes, Hx of HF, 103/62, 58 bpm, A/Ox4.

## 2023-09-12 NOTE — ED Provider Notes (Signed)
[place] EMERGENC[facility] DEPARTMENT AT [facility] Provider Note   CSN: [id number] Arrival date & time: 09/12/23  1622     History  Chief Complaint  Patient presents with   SOB    [name] is a 77 y.o. female.  Patient is a 77 year old female with a past medical history of CAD status post stent placement, CHF, diabetes, hypertension, chronic pain on chronic narcotics presenting to the emergency department with shortness of breath.  The patient states that she was seen at the pain clinic today and while at her visit she became hot and sweaty and was told that her lips turned blue.  She had her vital signs checked and her blood pressure were less low and her oxygen was in the 80s and she was placed on nonrebreather with improvement.  The patient states over the last 3 weeks she has been less mobile and has been gaining weight.  She states that she has had increasing shortness of breath over the last 3 weeks.  She states that she is having bilateral rib pain.  She states that she vomited once about 3 days ago but did not feel nauseous or vomit today.  She denies any lower extremity swelling.  She denies any significant cough or fevers.  She states that she normally does not use oxygen.  Denies any history of blood clots, recent hospitalizations or surgery, recent long travel in the car or plane, hormone use or cancer history.  The history is provided by the patient and medical records.       Home Medications Prior to Admission medications   Medication Sig Start Date End Date Taking? Authorizing Provider  aspirin EC 81 MG tablet Take 81 mg by mouth daily. Swallow whole.    [provider]  atorvastatin (LIPITOR) 40 MG tablet Take 40 mg by mouth daily. 12/06/19   [provider]  busPIRone (BUSPAR) 30 MG tablet Take 30 mg by mouth 2 (two) times daily.    [provider]  calcium-vitamin D (OSCAL 500/200 D-3) 500-200 MG-UNIT tablet Take 1 tablet by mouth  2 (two) times daily. 02/20/21 02/20/22  Enedina Finner, MD  Cholecalciferol 25 MCG (1000 UT) tablet Take 1,000 Units by mouth daily.    [provider]  estradiol (ESTRACE) 0.1 MG/GM vaginal cream Apply one pea-sized amount around the opening of the urethra every day for 2 weeks, then 3 times weekly moving forward. 08/14/21   Vaillancourt, Lelon Mast, PA-C  ferrous sulfate 325 (65 FE) MG tablet Take 325 mg by mouth 2 (two) times daily with a meal.    [provider]  furosemide (LASIX) 40 MG tablet Take 1 tablet (40 mg total) by mouth daily as needed. Please hold for next few days 01/01/22   Arnetha Courser, MD  gabapentin (NEURONTIN) 600 MG tablet Take 1,200 mg by mouth 2 (two) times daily.    [provider]  LANTUS SOLOSTAR 100 UNIT/ML Solostar Pen Inject 15 Units into the skin at bedtime. 02/20/21   Enedina Finner, MD  levothyroxine (SYNTHROID) 75 MCG tablet Take 1 tablet (75 mcg total) by mouth daily before breakfast. 02/08/20   Arnetha Courser, MD  lidocaine (LIDODERM) 5 % Place 2 patches onto the skin daily. Remove & Discard patch within 12 hours or as directed by MD 01/01/22   Arnetha Courser, MD  memantine (NAMENDA XR) 28 MG CP24 24 hr capsule Take 28 mg by mouth daily. 12/19/21   [provider]  metFORMIN (GLUCOPHAGE)  1000 MG tablet Take 1,000 mg by mouth 2 (two) times daily with a meal.    [provider]  metoprolol succinate (TOPROL-XL) 50 MG 24 hr tablet Take 50 mg by mouth daily. Take with or immediately following a meal.    [provider]  midodrine (PROAMATINE) 10 MG tablet Take 1 tablet (10 mg total) by mouth 3 (three) times daily with meals. 01/01/22   Arnetha Courser, MD  montelukast (SINGULAIR) 10 MG tablet SMARTSIG:1 Tablet(s) By Mouth Every Evening 12/09/21   [provider]  NOVOLOG FLEXPEN 100 UNIT/ML FlexPen SMARTSIG:15 Unit(s) SUB-Q Daily 11/29/21   [provider]  oxyCODONE-acetaminophen (PERCOCET/ROXICET) 5-325 MG tablet Take  1-2 tablets by mouth every 8 (eight) hours as needed for moderate pain or severe pain. 01/01/22   Arnetha Courser, MD  pantoprazole (PROTONIX) 40 MG tablet Take 40 mg by mouth daily as needed (heartburn).    [provider]  pioglitazone (ACTOS) 15 MG tablet Take 15 mg by mouth daily. 11/14/21   [provider]  tolterodine (DETROL LA) 2 MG 24 hr capsule Take 2 mg by mouth daily. 09/21/21   [provider]  topiramate (TOPAMAX) 200 MG tablet Take 200 mg by mouth at bedtime. 12/22/20   [provider]  TRULICITY 1.5 MG/0.5ML SOPN Inject 1.5 mg into the skin once a week. 11/17/21   [provider]  VIIBRYD 40 MG TABS Take 40 mg by mouth daily. 02/01/21   [provider]      Allergies    Patient has no known allergies.    Review of Systems   Review of Systems  Physical Exam Updated Vital Signs BP 96/60   Pulse 61   Temp (!) 97.2 F (36.2 C)   Resp 13   Ht 5\' 4"  (1.626 m)   Wt 118.4 kg   SpO2 95%   BMI 44.80 kg/m  Physical Exam Vitals and nursing note reviewed.  Constitutional:      General: She is not in acute distress.    Appearance: Normal appearance. She is obese.  HENT:     Head: Normocephalic and atraumatic.     Nose: Nose normal.     Mouth/Throat:     Mouth: Mucous membranes are moist.     Pharynx: Oropharynx is clear.  Eyes:     Extraocular Movements: Extraocular movements intact.     Conjunctiva/sclera: Conjunctivae normal.  Cardiovascular:     Rate and Rhythm: Normal rate and regular rhythm.     Heart sounds: Normal heart sounds.  Pulmonary:     Effort: Pulmonary effort is normal.     Breath sounds: Rales (Bilateral bases) present.  Abdominal:     General: Abdomen is flat.     Palpations: Abdomen is soft.     Tenderness: There is no abdominal tenderness.  Musculoskeletal:        General: Normal range of motion.     Cervical back: Normal range of motion.     Right lower leg: No edema.     Left lower leg: No  edema.     Comments: Mild bilateral lateral chest wall tenderness to palpation  Skin:    General: Skin is warm and dry.  Neurological:     General: No focal deficit present.     Mental Status: She is alert and oriented to person, place, and time.  Psychiatric:        Mood and Affect: Mood normal.  Behavior: Behavior normal.     ED Results / Procedures / Treatments   Labs (all labs ordered are listed, but only abnormal results are displayed) Labs Reviewed  COMPREHENSIVE METABOLIC PANEL - Abnormal; Notable for the following components:      Result Value   Sodium 133 (*)    Chloride 96 (*)    Glucose, Bld 195 (*)    BUN 26 (*)    Creatinine, Ser 1.70 (*)    GFR, Estimated 31 (*)    All other components within normal limits  CBC WITH DIFFERENTIAL/PLATELET - Abnormal; Notable for the following components:   Platelets 142 (*)    All other components within normal limits  D-DIMER, QUANTITATIVE - Abnormal; Notable for the following components:   D-Dimer, Quant 0.59 (*)    All other components within normal limits  URINALYSIS, W/ REFLEX TO CULTURE (INFECTION SUSPECTED) - Abnormal; Notable for the following components:   APPearance HAZY (*)    Leukocytes,Ua TRACE (*)    Bacteria, UA MANY (*)    All other components within normal limits  BRAIN NATRIURETIC PEPTIDE  TROPONIN I (HIGH SENSITIVITY)  TROPONIN I (HIGH SENSITIVITY)    EKG EKG Interpretation Date/Time:  Friday September 12 2023 16:55:27 EST Ventricular Rate:  57 PR Interval:  200 QRS Duration:  101 QT Interval:  439 QTC Calculation: 428 R Axis:   -33  Text Interpretation: Sinus rhythm Abnormal R-wave progression, early transition Inferior infarct, old Although rate has decreased No significant change was found Confirmed by Elayne Snare ([id number]) on 09/12/2023 5:01:01 PM  Radiology DG Chest Port 1 View  Result Date: 09/12/2023 CLINICAL DATA:  Shortness of breath x2 weeks. EXAM: PORTABLE CHEST 1 VIEW  COMPARISON:  December 28, 2021 FINDINGS: The heart size and mediastinal contours are within normal limits. Low lung volumes are noted. Mild atelectasis is seen within the bilateral lung bases. There is no evidence of an acute infiltrate, pleural effusion or pneumothorax. The visualized skeletal structures are unremarkable. IMPRESSION: Low lung volumes with mild bibasilar atelectasis. Electronically Signed   By: Aram Candela M.D.   On: 09/12/2023 19:57    Procedures .Critical Care  Performed by: Rexford Maus, DO Authorized by: Rexford Maus, DO   Critical care provider statement:    Critical care time (minutes):  40   Critical care time was exclusive of:  Separately billable procedures and treating other patients   Critical care was necessary to treat or prevent imminent or life-threatening deterioration of the following conditions:  Circulatory failure and respiratory failure   Critical care was time spent personally by me on the following activities:  Development of treatment plan with patient or surrogate, discussions with consultants, evaluation of patient's response to treatment, examination of patient, obtaining history from patient or surrogate, ordering and performing treatments and interventions, ordering and review of laboratory studies, ordering and review of radiographic studies, pulse oximetry, re-evaluation of patient's condition and review of old charts   I assumed direction of critical care for this patient from another provider in my specialty: no     Care discussed with: admitting provider   Ultrasound ED Echo  Date/Time: 09/12/2023 9:15 PM  Performed by: Rexford Maus, DO Authorized by: Rexford Maus, DO   Procedure details:    Indications: hypotension     Views: subxiphoid, parasternal long axis view, parasternal short axis view and apical 4 chamber view     Images: archived     Limitations:  Body habitus  and positioning Findings:     Pericardium: no pericardial effusion     LV Function: normal (>50% EF)     RV Diameter: normal   Impression:    Impression: normal       Medications Ordered in ED Medications  cefTRIAXone (ROCEPHIN) 1 g in sodium chloride 0.9 % 100 mL IVPB (has no administration in time range)  sodium chloride 0.9 % bolus 1,000 mL (0 mLs Intravenous Stopped 09/12/23 2018)  iohexol (OMNIPAQUE) 350 MG/ML injection 60 mL (60 mLs Intravenous Contrast Given 09/12/23 2106)    ED Course/ Medical Decision Making/ A&P Clinical Course as of 09/12/23 2320  Fri Sep 12, 2023  1748 Patient's BP dropped to 70's systolic, tried readjusting and changing cuff without improvement. 1 L IVF started. Patient reports she has been dizzy, no recent medication changes, no diarrhea. CBC with mild thrombocytopenia, labs otherwise pending. [VK]  1814 Age adjusted d-dimer negative. [VK]  1827 Mild AKI with creatinine of 1.7 from baseline around 1.3 [VK]  2014 Basilar atelectasis but no obvious explanation of hypoxia. With continued hypoxia and low BP will have CTPE study. [VK]  2306 Does have many bacteria in the urine, no squams. Will be treated in the setting of her hypotension. [VK]  2319 Patient signed out to Dr. Manus Gunning pending CTPE read with plan for admission for hypoxia. [VK]    Clinical Course User Index [VK] Rexford Maus, DO                                 Medical Decision Making This patient presents to the ED with chief complaint(s) of SOB, hypoxia with pertinent past medical history of CAD, CHF, HTN, DM which further complicates the presenting complaint. The complaint involves an extensive differential diagnosis and also carries with it a high risk of complications and morbidity.    The differential diagnosis includes ACS, arrhythmia, anemia, pneumonia, pneumothorax, pulmonary edema, pleural effusion, low risk for PE making this less likely  Additional history obtained: Additional history obtained from  N/A Records reviewed Care Everywhere/External Records - outpatient cardiology records  ED Course and Reassessment: On patient's arrival she was hemodynamically stable on 5 L nasal cannula in no acute distress.  EKG on arrival shows normal sinus rhythm without acute ischemic changes.  Patient will have labs and chest x-ray performed to evaluate for cause of her hypoxia and shortness of breath and will be closely reassessed.  Independent labs interpretation:  The following labs were independently interpreted: mild AKI, UA positive for UTI  Independent visualization of imaging: - I independently visualized the following imaging with scope of interpretation limited to determining acute life threatening conditions related to emergency care: CXR, which revealed no acute disease     Amount and/or Complexity of Data Reviewed Labs: ordered. Radiology: ordered.  Risk Prescription drug management.          Final Clinical Impression(s) / ED Diagnoses Final diagnoses:  Hypoxia  Hypotension, unspecified hypotension type  AKI (acute kidney injury) (HCC)  Acute cystitis without hematuria    Rx / DC Orders ED Discharge Orders     None         Rexford Maus, DO 09/12/23 2320

## 2023-09-12 NOTE — ED Notes (Signed)
Pt is requesting her night time medications. RN notified.

## 2023-09-12 NOTE — ED Notes (Signed)
Patient transported to CT 

## 2023-09-13 ENCOUNTER — Encounter (HOSPITAL_COMMUNITY): Payer: Self-pay | Admitting: Internal Medicine

## 2023-09-13 DIAGNOSIS — N179 Acute kidney failure, unspecified: Secondary | ICD-10-CM | POA: Diagnosis present

## 2023-09-13 DIAGNOSIS — J398 Other specified diseases of upper respiratory tract: Secondary | ICD-10-CM | POA: Diagnosis not present

## 2023-09-13 DIAGNOSIS — G894 Chronic pain syndrome: Secondary | ICD-10-CM

## 2023-09-13 DIAGNOSIS — J9601 Acute respiratory failure with hypoxia: Secondary | ICD-10-CM | POA: Diagnosis present

## 2023-09-13 DIAGNOSIS — I959 Hypotension, unspecified: Secondary | ICD-10-CM

## 2023-09-13 DIAGNOSIS — N1831 Chronic kidney disease, stage 3a: Secondary | ICD-10-CM | POA: Diagnosis present

## 2023-09-13 DIAGNOSIS — Z23 Encounter for immunization: Secondary | ICD-10-CM | POA: Diagnosis present

## 2023-09-13 DIAGNOSIS — Z794 Long term (current) use of insulin: Secondary | ICD-10-CM

## 2023-09-13 DIAGNOSIS — Z1152 Encounter for screening for COVID-19: Secondary | ICD-10-CM | POA: Diagnosis not present

## 2023-09-13 DIAGNOSIS — E785 Hyperlipidemia, unspecified: Secondary | ICD-10-CM | POA: Diagnosis present

## 2023-09-13 DIAGNOSIS — J40 Bronchitis, not specified as acute or chronic: Secondary | ICD-10-CM | POA: Diagnosis not present

## 2023-09-13 DIAGNOSIS — E039 Hypothyroidism, unspecified: Secondary | ICD-10-CM | POA: Diagnosis present

## 2023-09-13 DIAGNOSIS — I5032 Chronic diastolic (congestive) heart failure: Secondary | ICD-10-CM | POA: Diagnosis present

## 2023-09-13 DIAGNOSIS — E1165 Type 2 diabetes mellitus with hyperglycemia: Secondary | ICD-10-CM | POA: Diagnosis present

## 2023-09-13 DIAGNOSIS — F411 Generalized anxiety disorder: Secondary | ICD-10-CM | POA: Diagnosis present

## 2023-09-13 DIAGNOSIS — J9809 Other diseases of bronchus, not elsewhere classified: Secondary | ICD-10-CM | POA: Diagnosis present

## 2023-09-13 DIAGNOSIS — R0902 Hypoxemia: Secondary | ICD-10-CM | POA: Diagnosis present

## 2023-09-13 DIAGNOSIS — E1169 Type 2 diabetes mellitus with other specified complication: Secondary | ICD-10-CM | POA: Diagnosis present

## 2023-09-13 DIAGNOSIS — E8729 Other acidosis: Secondary | ICD-10-CM | POA: Diagnosis present

## 2023-09-13 DIAGNOSIS — K219 Gastro-esophageal reflux disease without esophagitis: Secondary | ICD-10-CM

## 2023-09-13 DIAGNOSIS — F32A Depression, unspecified: Secondary | ICD-10-CM | POA: Diagnosis present

## 2023-09-13 DIAGNOSIS — D631 Anemia in chronic kidney disease: Secondary | ICD-10-CM | POA: Diagnosis present

## 2023-09-13 DIAGNOSIS — J9811 Atelectasis: Secondary | ICD-10-CM | POA: Diagnosis present

## 2023-09-13 DIAGNOSIS — E871 Hypo-osmolality and hyponatremia: Secondary | ICD-10-CM | POA: Diagnosis present

## 2023-09-13 DIAGNOSIS — N183 Chronic kidney disease, stage 3 unspecified: Secondary | ICD-10-CM | POA: Insufficient documentation

## 2023-09-13 DIAGNOSIS — R6510 Systemic inflammatory response syndrome (SIRS) of non-infectious origin without acute organ dysfunction: Secondary | ICD-10-CM | POA: Diagnosis not present

## 2023-09-13 DIAGNOSIS — E669 Obesity, unspecified: Secondary | ICD-10-CM | POA: Diagnosis present

## 2023-09-13 DIAGNOSIS — R7989 Other specified abnormal findings of blood chemistry: Secondary | ICD-10-CM

## 2023-09-13 DIAGNOSIS — D649 Anemia, unspecified: Secondary | ICD-10-CM

## 2023-09-13 DIAGNOSIS — N3000 Acute cystitis without hematuria: Secondary | ICD-10-CM | POA: Diagnosis present

## 2023-09-13 DIAGNOSIS — E119 Type 2 diabetes mellitus without complications: Secondary | ICD-10-CM

## 2023-09-13 DIAGNOSIS — E1122 Type 2 diabetes mellitus with diabetic chronic kidney disease: Secondary | ICD-10-CM | POA: Diagnosis present

## 2023-09-13 DIAGNOSIS — E1140 Type 2 diabetes mellitus with diabetic neuropathy, unspecified: Secondary | ICD-10-CM | POA: Diagnosis present

## 2023-09-13 DIAGNOSIS — I130 Hypertensive heart and chronic kidney disease with heart failure and stage 1 through stage 4 chronic kidney disease, or unspecified chronic kidney disease: Secondary | ICD-10-CM | POA: Diagnosis present

## 2023-09-13 LAB — I-STAT ARTERIAL BLOOD GAS, ED
Acid-base deficit: 2 mmol/L (ref 0.0–2.0)
Bicarbonate: 25.5 mmol/L (ref 20.0–28.0)
Calcium, Ion: 1.22 mmol/L (ref 1.15–1.40)
HCT: 34 % — ABNORMAL LOW (ref 36.0–46.0)
Hemoglobin: 11.6 g/dL — ABNORMAL LOW (ref 12.0–15.0)
O2 Saturation: 93 %
Patient temperature: 36.4
Potassium: 4 mmol/L (ref 3.5–5.1)
Sodium: 134 mmol/L — ABNORMAL LOW (ref 135–145)
TCO2: 27 mmol/L (ref 22–32)
pCO2 arterial: 52.8 mm[Hg] — ABNORMAL HIGH (ref 32–48)
pH, Arterial: 7.289 — ABNORMAL LOW (ref 7.35–7.45)
pO2, Arterial: 73 mm[Hg] — ABNORMAL LOW (ref 83–108)

## 2023-09-13 LAB — MRSA NEXT GEN BY PCR, NASAL

## 2023-09-13 LAB — RESPIRATORY PANEL BY PCR

## 2023-09-13 LAB — CBC
HCT: 38.8 % (ref 36.0–46.0)
Hemoglobin: 11.9 g/dL — ABNORMAL LOW (ref 12.0–15.0)
MCH: 28.8 pg (ref 26.0–34.0)
MCHC: 30.7 g/dL (ref 30.0–36.0)
MCV: 93.9 fL (ref 80.0–100.0)
Platelets: 104 10*3/uL — ABNORMAL LOW (ref 150–400)
RBC: 4.13 MIL/uL (ref 3.87–5.11)
RDW: 13.5 % (ref 11.5–15.5)
WBC: 8.3 10*3/uL (ref 4.0–10.5)
nRBC: 0 % (ref 0.0–0.2)

## 2023-09-13 LAB — COMPREHENSIVE METABOLIC PANEL
ALT: 21 U/L (ref 0–44)
AST: 32 U/L (ref 15–41)
Albumin: 2.9 g/dL — ABNORMAL LOW (ref 3.5–5.0)
Alkaline Phosphatase: 51 U/L (ref 38–126)
Anion gap: 9 (ref 5–15)
BUN: 25 mg/dL — ABNORMAL HIGH (ref 8–23)
CO2: 23 mmol/L (ref 22–32)
Calcium: 8.3 mg/dL — ABNORMAL LOW (ref 8.9–10.3)
Chloride: 100 mmol/L (ref 98–111)
Creatinine, Ser: 1.61 mg/dL — ABNORMAL HIGH (ref 0.44–1.00)
GFR, Estimated: 33 mL/min — ABNORMAL LOW (ref >60)
Glucose, Bld: 172 mg/dL — ABNORMAL HIGH (ref 70–99)
Potassium: 4.6 mmol/L (ref 3.5–5.1)
Sodium: 132 mmol/L — ABNORMAL LOW (ref 135–145)
Total Bilirubin: 0.6 mg/dL (ref <1.2)
Total Protein: 5.5 g/dL — ABNORMAL LOW (ref 6.5–8.1)

## 2023-09-13 LAB — CBG MONITORING, ED
Glucose-Capillary: 171 mg/dL — ABNORMAL HIGH (ref 70–99)
Glucose-Capillary: 221 mg/dL — ABNORMAL HIGH (ref 70–99)
Glucose-Capillary: 302 mg/dL — ABNORMAL HIGH (ref 70–99)
Glucose-Capillary: 322 mg/dL — ABNORMAL HIGH (ref 70–99)

## 2023-09-13 LAB — LACTIC ACID, PLASMA
Lactic Acid, Venous: 1.7 mmol/L (ref 0.5–1.9)
Lactic Acid, Venous: 1.2 mmol/L (ref 0.5–1.9)

## 2023-09-13 LAB — HEMOGLOBIN A1C
Hgb A1c MFr Bld: 11.9 % — ABNORMAL HIGH (ref 4.8–5.6)
Mean Plasma Glucose: 294.83 mg/dL

## 2023-09-13 LAB — CORTISOL-AM, BLOOD: Cortisol - AM: 2.3 ug/dL — ABNORMAL LOW (ref 6.7–22.6)

## 2023-09-13 LAB — PROCALCITONIN: Procalcitonin: <0.1 ng/mL

## 2023-09-13 LAB — GLUCOSE, CAPILLARY
Glucose-Capillary: 361 mg/dL — ABNORMAL HIGH (ref 70–99)
Glucose-Capillary: 314 mg/dL — ABNORMAL HIGH (ref 70–99)

## 2023-09-13 LAB — SARS CORONAVIRUS 2 BY RT PCR: SARS Coronavirus 2 by RT PCR: NEGATIVE

## 2023-09-13 LAB — STREP PNEUMONIAE URINARY ANTIGEN: Strep Pneumo Urinary Antigen: NEGATIVE

## 2023-09-13 MED ORDER — PANTOPRAZOLE SODIUM 40 MG PO TBEC: 40.0000 mg | DELAYED_RELEASE_TABLET | Freq: Every day | ORAL | Status: DC | PRN

## 2023-09-13 MED ORDER — FERROUS SULFATE 325 (65 FE) MG PO TABS: 325.0000 mg | ORAL_TABLET | Freq: Two times a day (BID) | ORAL | Status: DC

## 2023-09-13 MED ORDER — QUETIAPINE FUMARATE 100 MG PO TABS
100.0000 mg | ORAL_TABLET | Freq: Every day | ORAL | Status: DC
  Administered 2023-09-13 – 2023-09-15 (×3): 100 mg via ORAL
  Filled 2023-09-13 (×3): qty 1

## 2023-09-13 MED ORDER — SODIUM CHLORIDE 0.9 % IV SOLN: 2.0000 g | INTRAVENOUS | Status: DC

## 2023-09-13 MED ORDER — BUSPIRONE HCL 10 MG PO TABS: 30.0000 mg | ORAL_TABLET | Freq: Two times a day (BID) | ORAL | Status: DC

## 2023-09-13 MED ORDER — METHYLPREDNISOLONE SODIUM SUCC 125 MG IJ SOLR
80.0000 mg | Freq: Every day | INTRAMUSCULAR | Status: DC
  Administered 2023-09-14 – 2023-09-16 (×3): 80 mg via INTRAVENOUS
  Filled 2023-09-13 (×3): qty 2

## 2023-09-13 MED ORDER — SODIUM CHLORIDE 0.9% FLUSH
3.0000 mL | Freq: Two times a day (BID) | INTRAVENOUS | Status: DC
  Administered 2023-09-13 – 2023-09-15 (×6): 3 mL via INTRAVENOUS

## 2023-09-13 MED ORDER — METHYLPREDNISOLONE SODIUM SUCC 125 MG IJ SOLR: 80.0000 mg | Freq: Every day | INTRAMUSCULAR | Status: DC

## 2023-09-13 MED ORDER — ATORVASTATIN CALCIUM 40 MG PO TABS
40.0000 mg | ORAL_TABLET | Freq: Every day | ORAL | Status: DC
  Administered 2023-09-13: 40 mg via ORAL
  Filled 2023-09-13: qty 1

## 2023-09-13 MED ORDER — SODIUM CHLORIDE 0.9 % IV SOLN: 250.0000 mL | INTRAVENOUS | Status: DC | PRN

## 2023-09-13 MED ORDER — IPRATROPIUM-ALBUTEROL 0.5-2.5 (3) MG/3ML IN SOLN
3.0000 mL | RESPIRATORY_TRACT | Status: DC
  Administered 2023-09-13: 3 mL via RESPIRATORY_TRACT
  Filled 2023-09-13: qty 3

## 2023-09-13 MED ORDER — MONTELUKAST SODIUM 10 MG PO TABS
10.0000 mg | ORAL_TABLET | Freq: Every day | ORAL | Status: DC
  Administered 2023-09-13: 10 mg via ORAL
  Filled 2023-09-13: qty 1

## 2023-09-13 MED ORDER — INSULIN ASPART 100 UNIT/ML IJ SOLN
0.0000 [IU] | Freq: Three times a day (TID) | INTRAMUSCULAR | Status: DC
  Administered 2023-09-13: 15 [IU] via SUBCUTANEOUS
  Administered 2023-09-14: 7 [IU] via SUBCUTANEOUS
  Administered 2023-09-14 (×2): 20 [IU] via SUBCUTANEOUS
  Administered 2023-09-14: 4 [IU] via SUBCUTANEOUS
  Administered 2023-09-15: 7 [IU] via SUBCUTANEOUS
  Administered 2023-09-15 (×2): 15 [IU] via SUBCUTANEOUS
  Administered 2023-09-15: 20 [IU] via SUBCUTANEOUS
  Administered 2023-09-16: 7 [IU] via SUBCUTANEOUS

## 2023-09-13 MED ORDER — INSULIN GLARGINE-YFGN 100 UNIT/ML ~~LOC~~ SOLN
70.0000 [IU] | Freq: Two times a day (BID) | SUBCUTANEOUS | Status: DC
  Administered 2023-09-13 – 2023-09-15 (×4): 70 [IU] via SUBCUTANEOUS
  Filled 2023-09-13 (×5): qty 0.7

## 2023-09-13 MED ORDER — ONDANSETRON HCL 4 MG PO TABS: 4.0000 mg | ORAL_TABLET | Freq: Four times a day (QID) | ORAL | Status: DC | PRN

## 2023-09-13 MED ORDER — ENOXAPARIN SODIUM 40 MG/0.4ML IJ SOSY
40.0000 mg | PREFILLED_SYRINGE | INTRAMUSCULAR | Status: DC
  Administered 2023-09-13 – 2023-09-16 (×4): 40 mg via SUBCUTANEOUS
  Filled 2023-09-13 (×4): qty 0.4

## 2023-09-13 MED ORDER — INSULIN GLARGINE-YFGN 100 UNIT/ML ~~LOC~~ SOLN
15.0000 [IU] | Freq: Every day | SUBCUTANEOUS | Status: DC
  Filled 2023-09-13: qty 0.15

## 2023-09-13 MED ORDER — GABAPENTIN 400 MG PO CAPS
1200.0000 mg | ORAL_CAPSULE | Freq: Two times a day (BID) | ORAL | Status: DC
  Administered 2023-09-13 – 2023-09-16 (×6): 1200 mg via ORAL
  Filled 2023-09-13 (×6): qty 3

## 2023-09-13 MED ORDER — INSULIN GLARGINE-YFGN 100 UNIT/ML ~~LOC~~ SOLN
45.0000 [IU] | Freq: Two times a day (BID) | SUBCUTANEOUS | Status: DC
  Administered 2023-09-13: 45 [IU] via SUBCUTANEOUS
  Filled 2023-09-13 (×2): qty 0.45

## 2023-09-13 MED ORDER — ACETAMINOPHEN 325 MG PO TABS
650.0000 mg | ORAL_TABLET | Freq: Four times a day (QID) | ORAL | Status: DC | PRN
  Administered 2023-09-13 – 2023-09-14 (×3): 650 mg via ORAL
  Filled 2023-09-13 (×4): qty 2

## 2023-09-13 MED ORDER — IPRATROPIUM-ALBUTEROL 0.5-2.5 (3) MG/3ML IN SOLN
3.0000 mL | Freq: Four times a day (QID) | RESPIRATORY_TRACT | Status: DC
  Administered 2023-09-13 – 2023-09-15 (×9): 3 mL via RESPIRATORY_TRACT
  Filled 2023-09-13 (×10): qty 3

## 2023-09-13 MED ORDER — SODIUM CHLORIDE 0.9% FLUSH
3.0000 mL | INTRAVENOUS | Status: DC | PRN
Start: 2023-09-13 — End: 2023-09-16

## 2023-09-13 MED ORDER — SODIUM CHLORIDE 0.9 % IV SOLN
1.0000 g | Freq: Once | INTRAVENOUS | Status: DC
  Filled 2023-09-13: qty 10

## 2023-09-13 MED ORDER — OXYCODONE-ACETAMINOPHEN 5-325 MG PO TABS
1.0000 | ORAL_TABLET | Freq: Four times a day (QID) | ORAL | Status: DC | PRN
  Administered 2023-09-13: 2 via ORAL
  Administered 2023-09-14: 1 via ORAL
  Administered 2023-09-15: 2 via ORAL
  Filled 2023-09-13 (×2): qty 2
  Filled 2023-09-13: qty 1

## 2023-09-13 MED ORDER — LACTATED RINGERS IV SOLN: INTRAVENOUS | Status: DC

## 2023-09-13 MED ORDER — DEXTROSE 5 % IV SOLN
500.0000 mg | Freq: Once | INTRAVENOUS | Status: AC
  Administered 2023-09-13: 500 mg via INTRAVENOUS
  Filled 2023-09-13: qty 5

## 2023-09-13 MED ORDER — DEXTROSE 5 % IV SOLN
500.0000 mg | INTRAVENOUS | Status: DC
  Administered 2023-09-14 – 2023-09-15 (×2): 500 mg via INTRAVENOUS
  Filled 2023-09-13 (×2): qty 5

## 2023-09-13 MED ORDER — DEXTROSE 5 % IV SOLN: 500.0000 mg | INTRAVENOUS | Status: DC

## 2023-09-13 MED ORDER — SODIUM CHLORIDE 0.9 % IV SOLN
2.0000 g | INTRAVENOUS | Status: DC
  Administered 2023-09-13 – 2023-09-16 (×4): 2 g via INTRAVENOUS
  Filled 2023-09-13 (×4): qty 20

## 2023-09-13 MED ORDER — INSULIN ASPART 100 UNIT/ML IJ SOLN
10.0000 [IU] | Freq: Three times a day (TID) | INTRAMUSCULAR | Status: DC
  Administered 2023-09-13 – 2023-09-15 (×6): 10 [IU] via SUBCUTANEOUS

## 2023-09-13 MED ORDER — ASPIRIN 81 MG PO TBEC
81.0000 mg | DELAYED_RELEASE_TABLET | Freq: Every day | ORAL | Status: DC
  Administered 2023-09-13 – 2023-09-16 (×4): 81 mg via ORAL
  Filled 2023-09-13 (×4): qty 1

## 2023-09-13 MED ORDER — LEVOTHYROXINE SODIUM 75 MCG PO TABS
75.0000 ug | ORAL_TABLET | Freq: Every day | ORAL | Status: DC
  Administered 2023-09-13 – 2023-09-16 (×4): 75 ug via ORAL
  Filled 2023-09-13 (×4): qty 1

## 2023-09-13 MED ORDER — ZOLPIDEM TARTRATE 5 MG PO TABS
5.0000 mg | ORAL_TABLET | Freq: Every day | ORAL | Status: DC
Start: 2023-09-13 — End: 2023-09-16
  Administered 2023-09-13: 5 mg via ORAL
  Administered 2023-09-14 – 2023-09-15 (×2): 10 mg via ORAL
  Filled 2023-09-13: qty 2
  Filled 2023-09-13: qty 1
  Filled 2023-09-13: qty 2

## 2023-09-13 MED ORDER — MIDODRINE HCL 5 MG PO TABS
10.0000 mg | ORAL_TABLET | Freq: Three times a day (TID) | ORAL | Status: DC
  Administered 2023-09-13 (×3): 10 mg via ORAL
  Filled 2023-09-13 (×4): qty 2

## 2023-09-13 MED ORDER — LACTATED RINGERS IV BOLUS
500.0000 mL | Freq: Once | INTRAVENOUS | Status: AC
  Administered 2023-09-13: 500 mL via INTRAVENOUS

## 2023-09-13 MED ORDER — NYSTATIN 100000 UNIT/GM EX POWD
Freq: Two times a day (BID) | CUTANEOUS | Status: DC
Start: 2023-09-13 — End: 2023-09-16
  Filled 2023-09-13: qty 15

## 2023-09-13 MED ORDER — FERROUS SULFATE 325 (65 FE) MG PO TABS
325.0000 mg | ORAL_TABLET | Freq: Every day | ORAL | Status: DC
  Administered 2023-09-13 – 2023-09-16 (×4): 325 mg via ORAL
  Filled 2023-09-13 (×4): qty 1

## 2023-09-13 MED ORDER — CLONAZEPAM 0.5 MG PO TABS
0.5000 mg | ORAL_TABLET | Freq: Every day | ORAL | Status: DC | PRN
  Administered 2023-09-13: 0.5 mg via ORAL
  Filled 2023-09-13 (×2): qty 1

## 2023-09-13 MED ORDER — ALBUMIN HUMAN 25 % IV SOLN
50.0000 g | Freq: Once | INTRAVENOUS | Status: AC
  Administered 2023-09-13: 12.5 g via INTRAVENOUS
  Filled 2023-09-13: qty 200

## 2023-09-13 MED ORDER — CLONAZEPAM 0.5 MG PO TABS
0.5000 mg | ORAL_TABLET | Freq: Two times a day (BID) | ORAL | Status: DC | PRN
  Administered 2023-09-14: 0.5 mg via ORAL
  Filled 2023-09-13: qty 1

## 2023-09-13 MED ORDER — INSULIN ASPART 100 UNIT/ML IJ SOLN: 0.0000 [IU] | Freq: Every day | INTRAMUSCULAR | Status: DC

## 2023-09-13 MED ORDER — LEVALBUTEROL HCL 0.63 MG/3ML IN NEBU: 0.6300 mg | INHALATION_SOLUTION | Freq: Four times a day (QID) | RESPIRATORY_TRACT | Status: DC | PRN

## 2023-09-13 MED ORDER — INSULIN GLARGINE-YFGN 100 UNIT/ML ~~LOC~~ SOLN: 45.0000 [IU] | Freq: Two times a day (BID) | SUBCUTANEOUS | Status: DC

## 2023-09-13 MED ORDER — ACETAMINOPHEN 650 MG RE SUPP: 650.0000 mg | Freq: Four times a day (QID) | RECTAL | Status: DC | PRN

## 2023-09-13 MED ORDER — ONDANSETRON HCL 4 MG/2ML IJ SOLN
4.0000 mg | Freq: Four times a day (QID) | INTRAMUSCULAR | Status: DC | PRN
  Administered 2023-09-13: 4 mg via INTRAVENOUS
  Filled 2023-09-13: qty 2

## 2023-09-13 MED ORDER — ROSUVASTATIN CALCIUM 20 MG PO TABS
20.0000 mg | ORAL_TABLET | Freq: Every morning | ORAL | Status: DC
  Administered 2023-09-14 – 2023-09-16 (×3): 20 mg via ORAL
  Filled 2023-09-13 (×3): qty 1

## 2023-09-13 MED ORDER — INSULIN ASPART 100 UNIT/ML IJ SOLN
0.0000 [IU] | Freq: Three times a day (TID) | INTRAMUSCULAR | Status: DC
  Administered 2023-09-13: 11 [IU] via SUBCUTANEOUS
  Administered 2023-09-13: 15 [IU] via SUBCUTANEOUS
  Administered 2023-09-13: 3 [IU] via SUBCUTANEOUS

## 2023-09-13 MED ORDER — METHYLPREDNISOLONE SODIUM SUCC 125 MG IJ SOLR
125.0000 mg | Freq: Once | INTRAMUSCULAR | Status: AC
  Administered 2023-09-13: 125 mg via INTRAVENOUS
  Filled 2023-09-13: qty 2

## 2023-09-13 MED ORDER — GUAIFENESIN ER 600 MG PO TB12
600.0000 mg | ORAL_TABLET | Freq: Two times a day (BID) | ORAL | Status: AC
  Administered 2023-09-13 – 2023-09-15 (×7): 600 mg via ORAL
  Filled 2023-09-13 (×7): qty 1

## 2023-09-13 NOTE — ED Notes (Signed)
Breakfast at bedside.

## 2023-09-13 NOTE — Hospital Course (Signed)
[name] is a 77 yo female with PMH obesity, dCHF, CAD, CKD, depression/anxiety, DM II, HTN, HLD, IBS, hypothyroidism who presented with shortness of breath.  She was brought via EMS from [facility] when picking up pain prescription refills.  She had endorsed shortness of breath for approximately 2 weeks and worsening edema. She was noted to be hypoxic in the low 80s and was placed on 4 L oxygen then nonrebreather.  Lips were also cyanotic on workup. CT angio chest was obtained which was negative for PE and showed tracheobronchomalacia.  Case was discussed with pulmonology on admission and she was recommended for steroids, bronchodilators, and infectious workup.

## 2023-09-13 NOTE — ED Notes (Signed)
ED TO INPATIENT HANDOFF REPORT  ED Nurse Name and Phone #: Grover Canavan [phone]  S Name/Age/Gender [name] 77 y.o. female Room/Bed: 006C/006C  Code Status   Code Status: Full Code  Home/SNF/Other Home Patient oriented to: self, place, time, and situation Is this baseline? Yes   Triage Complete: Triage complete  Chief Complaint Acute hypoxic respiratory failure (HCC) [J96.01]  Triage Note Pt BIB [facility] from [facility] where she was there to get her pain prescription refilled. While there she could not urinate & then after talking with provider told them she has also been SOB for 2 weeks & retaining fluid. Her RA in the office was in the 80's & only 88% on 4L O2, then 96% on NRB. EMS reports her lips were blue when she transferred to their stretcher from the office chair, rales in lower lobes, Hx of HF, 103/62, 58 bpm, A/Ox4.     Allergies No Known Allergies  Level of Care/Admitting Diagnosis ED Disposition     ED Disposition  Admit   Condition  --   Comment  Hospital Area: [facility] [[id number]]  Level of Care: Progressive [102]  Admit to Progressive based on following criteria: RESPIRATORY PROBLEMS hypoxemic/hypercapnic respiratory failure that is responsive to NIPPV (BiPAP) or High Flow Nasal Cannula (6-80 lpm). Frequent assessment/intervention, no > Q2 hrs < Q4 hrs, to maintain oxygenation and pulmonary hygiene.  May admit patient to [facility] or [facility] if equivalent level of care is available:: No  Covid Evaluation: Symptomatic Person Under Investigation (PUI) or recent exposure (last 10 days) *Testing Required*  Diagnosis: Acute hypoxic respiratory failure [facility]) [[id number]]  Admitting Physician: Tereasa Coop [[id number]]  Attending Physician: Tereasa Coop [[id number]]  Certification:: I certify this patient will need inpatient services for at least 2 midnights  Expected Medical Readiness: 09/18/2023          B Medical/Surgery  History Past Medical History:  Diagnosis Date   Amnesia    Anxiety    Arthritis    Back pain    CHF (congestive heart failure) (HCC)    Chronic kidney disease    Acute on chronic 10/2020   Chronic pain syndrome    Cystocele    Depression    Diabetes mellitus without complication (HCC)    Type II   Diabetes mellitus without complication (HCC)    GERD (gastroesophageal reflux disease)    History of blood transfusion    with knee placements   Hyperlipidemia    Hypertension    IBS (irritable bowel syndrome)    Obesity    Osteoporosis    Osteoporosis    Panic attacks    Seizure (HCC)    Shortness of breath dyspnea    Sleep apnea    not using now   Thyroid nodule    Past Surgical History:  Procedure Laterality Date   ABDOMINAL HYSTERECTOMY     APPENDECTOMY     BACK SURGERY     CARDIAC CATHETERIZATION     CHOLECYSTECTOMY     CORONARY STENT INTERVENTION N/A 05/05/2018   Procedure: CORONARY STENT INTERVENTION;  Surgeon: Marcina Millard, MD;  Location: [facility] INVASIVE CV LAB;  Service: Cardiovascular;  Laterality: N/A;   FOOT SURGERY     LEFT HEART CATH AND CORONARY ANGIOGRAPHY Left 05/05/2018   Procedure: LEFT HEART CATH AND CORONARY ANGIOGRAPHY;  Surgeon: Marcina Millard, MD;  Location: [facility] INVASIVE CV LAB;  Service: Cardiovascular;  Laterality: Left;   OOPHORECTOMY     REPLACEMENT  TOTAL KNEE     TONSILLECTOMY       A IV Location/Drains/Wounds Patient Lines/Drains/Airways Status     Active Line/Drains/Airways     Name Placement date Placement time Site Days   Peripheral IV 09/12/23 20 G Distal;Left;Posterior Forearm 09/12/23  1835  Forearm  1   Peripheral IV 09/12/23 20 G Right Antecubital 09/12/23  2025  Antecubital  1   Incision (Closed) 02/09/21 Back 02/09/21  1100  -- 946            Intake/Output Last 24 hours  Intake/Output Summary (Last 24 hours) at 09/13/2023 1228 Last data filed at 09/13/2023 0232 Gross per 24 hour  Intake 283 ml  Output  200 ml  Net 83 ml    Labs/Imaging Results for orders placed or performed during the hospital encounter of 09/12/23 (from the past 48 hour(s))  Comprehensive metabolic panel     Status: Abnormal   Collection Time: 09/12/23  5:11 PM  Result Value Ref Range   Sodium 133 (L) 135 - 145 mmol/L   Potassium 4.4 3.5 - 5.1 mmol/L   Chloride 96 (L) 98 - 111 mmol/L   CO2 25 22 - 32 mmol/L   Glucose, Bld 195 (H) 70 - 99 mg/dL    Comment: Glucose reference range applies only to samples taken after fasting for at least 8 hours.   BUN 26 (H) 8 - 23 mg/dL   Creatinine, Ser 0.10 (H) 0.44 - 1.00 mg/dL   Calcium 9.3 8.9 - 27.2 mg/dL   Total Protein 7.0 6.5 - 8.1 g/dL   Albumin 3.5 3.5 - 5.0 g/dL   AST 37 15 - 41 U/L   ALT 28 0 - 44 U/L   Alkaline Phosphatase 64 38 - 126 U/L   Total Bilirubin 0.7 <1.2 mg/dL   GFR, Estimated 31 (L) >60 mL/min    Comment: (NOTE) Calculated using the CKD-EPI Creatinine Equation (2021)    Anion gap 12 5 - 15    Comment: Performed at [facility], [address], [place], [place] [address]  Brain natriuretic peptide     Status: None   Collection Time: 09/12/23  5:11 PM  Result Value Ref Range   B Natriuretic Peptide 19.9 0.0 - 100.0 pg/mL    Comment: Performed at [facility], [address], [place], [place] [address]  Troponin I (High Sensitivity)     Status: None   Collection Time: 09/12/23  5:11 PM  Result Value Ref Range   Troponin I (High Sensitivity) 6 <18 ng/L    Comment: (NOTE) Elevated high sensitivity troponin I (hsTnI) values and significant  changes across serial measurements may suggest ACS but many other  chronic and acute conditions are known to elevate hsTnI results.  Refer to the "Links" section for chest pain algorithms and additional  guidance. Performed at [facility], [address], [place], [place] [address]   CBC with Differential     Status: Abnormal   Collection Time: 09/12/23  5:11 PM  Result Value Ref Range    WBC 8.3 4.0 - 10.5 K/uL   RBC 4.69 3.87 - 5.11 MIL/uL   Hemoglobin 13.6 12.0 - 15.0 g/dL   HCT 63.8 75.6 - 43.3 %   MCV 91.0 80.0 - 100.0 fL   MCH 29.0 26.0 - 34.0 pg   MCHC 31.9 30.0 - 36.0 g/dL   RDW 29.5 18.8 - 41.6 %   Platelets 142 (L) 150 - 400 K/uL  nRBC 0.0 0.0 - 0.2 %   Neutrophils Relative % 54 %   Neutro Abs 4.6 1.7 - 7.7 K/uL   Lymphocytes Relative 33 %   Lymphs Abs 2.7 0.7 - 4.0 K/uL   Monocytes Relative 9 %   Monocytes Absolute 0.8 0.1 - 1.0 K/uL   Eosinophils Relative 2 %   Eosinophils Absolute 0.2 0.0 - 0.5 K/uL   Basophils Relative 1 %   Basophils Absolute 0.1 0.0 - 0.1 K/uL   Immature Granulocytes 1 %   Abs Immature Granulocytes 0.05 0.00 - 0.07 K/uL    Comment: Performed at [facility], [address], [place], [place] [address]  D-dimer, quantitative     Status: Abnormal   Collection Time: 09/12/23  5:14 PM  Result Value Ref Range   D-Dimer, Quant 0.59 (H) 0.00 - 0.50 ug/mL-FEU    Comment: (NOTE) At the manufacturer cut-off value of 0.5 g/mL FEU, this assay has a negative predictive value of 95-100%.This assay is intended for use in conjunction with a clinical pretest probability (PTP) assessment model to exclude pulmonary embolism (PE) and deep venous thrombosis (DVT) in outpatients suspected of PE or DVT. Results should be correlated with clinical presentation. Performed at [facility], [address], [place], [place] [address]   Troponin I (High Sensitivity)     Status: None   Collection Time: 09/12/23  8:16 PM  Result Value Ref Range   Troponin I (High Sensitivity) 6 <18 ng/L    Comment: (NOTE) Elevated high sensitivity troponin I (hsTnI) values and significant  changes across serial measurements may suggest ACS but many other  chronic and acute conditions are known to elevate hsTnI results.  Refer to the "Links" section for chest pain algorithms and additional  guidance. Performed at [facility], [address],  [place], [place] [address]   Urinalysis, w/ Reflex to Culture (Infection Suspected) -Urine, Clean Catch     Status: Abnormal   Collection Time: 09/12/23 10:49 PM  Result Value Ref Range   Specimen Source URINE, CLEAN CATCH    Color, Urine YELLOW YELLOW   APPearance HAZY (A) CLEAR   Specific Gravity, Urine 1.021 1.005 - 1.030   pH 5.0 5.0 - 8.0   Glucose, UA NEGATIVE NEGATIVE mg/dL   Hgb urine dipstick NEGATIVE NEGATIVE   Bilirubin Urine NEGATIVE NEGATIVE   Ketones, ur NEGATIVE NEGATIVE mg/dL   Protein, ur NEGATIVE NEGATIVE mg/dL   Nitrite NEGATIVE NEGATIVE   Leukocytes,Ua TRACE (A) NEGATIVE   RBC / HPF 0-5 0 - 5 RBC/hpf   WBC, UA 6-10 0 - 5 WBC/hpf    Comment:        Reflex urine culture not performed if WBC <=10, OR if Squamous epithelial cells >5. If Squamous epithelial cells >5 suggest recollection.    Bacteria, UA MANY (A) NONE SEEN   Squamous Epithelial / HPF 0-5 0 - 5 /HPF   Mucus PRESENT    Hyaline Casts, UA PRESENT     Comment: Performed at [facility], [address], [place], [place] [address]  Lactic acid, plasma     Status: None   Collection Time: 09/13/23 12:20 AM  Result Value Ref Range   Lactic Acid, Venous 1.7 0.5 - 1.9 mmol/L    Comment: Performed at [facility], [address], [place], [place] [address]  Blood culture (routine x 2)     Status: None (Preliminary result)   Collection Time: 09/13/23 12:20  AM   Specimen: BLOOD RIGHT HAND  Result Value Ref Range   Specimen Description BLOOD RIGHT HAND    Special Requests      BOTTLES DRAWN AEROBIC AND ANAEROBIC Blood Culture adequate volume   Culture      NO GROWTH < 12 HOURS Performed at [facility], [address], [place], [place] [address]    Report Status PENDING   Blood culture (routine x 2)     Status: None (Preliminary result)   Collection Time: 09/13/23 12:34 AM   Specimen: BLOOD LEFT HAND  Result Value Ref Range   Specimen Description BLOOD LEFT HAND    Special Requests       BOTTLES DRAWN AEROBIC AND ANAEROBIC Blood Culture adequate volume   Culture      NO GROWTH < 12 HOURS Performed at [facility], [address] [place], [place] [address]    Report Status PENDING   Procalcitonin     Status: None   Collection Time: 09/13/23 12:36 AM  Result Value Ref Range   Procalcitonin <0.10 ng/mL    Comment:        Interpretation: PCT (Procalcitonin) <= 0.5 ng/mL: Systemic infection (sepsis) is not likely. Local bacterial infection is possible. (NOTE)       Sepsis PCT Algorithm           Lower Respiratory Tract                                      Infection PCT Algorithm    ----------------------------     ----------------------------         PCT < 0.25 ng/mL                PCT < 0.10 ng/mL          Strongly encourage             Strongly discourage   discontinuation of antibiotics    initiation of antibiotics    ----------------------------     -----------------------------       PCT 0.25 - 0.50 ng/mL            PCT 0.10 - 0.25 ng/mL               OR       >80% decrease in PCT            Discourage initiation of                                            antibiotics      Encourage discontinuation           of antibiotics    ----------------------------     -----------------------------         PCT >= 0.50 ng/mL              PCT 0.26 - 0.50 ng/mL               AND        <80% decrease in PCT             Encourage initiation of  antibiotics       Encourage continuation           of antibiotics    ----------------------------     -----------------------------        PCT >= 0.50 ng/mL                  PCT > 0.50 ng/mL               AND         increase in PCT                  Strongly encourage                                      initiation of antibiotics    Strongly encourage escalation           of antibiotics                                     -----------------------------                                            PCT <= 0.25 ng/mL                                                 OR                                        > 80% decrease in PCT                                      Discontinue / Do not initiate                                             antibiotics  Performed at [facility], [address], [place], [place] [address]   Cortisol-am, blood     Status: Abnormal   Collection Time: 09/13/23 12:36 AM  Result Value Ref Range   Cortisol - AM 2.3 (L) 6.7 - 22.6 ug/dL    Comment: Performed at [facility], [address] [place], [place] [address]  CBC     Status: Abnormal   Collection Time: 09/13/23 12:36 AM  Result Value Ref Range   WBC 8.3 4.0 - 10.5 K/uL   RBC 4.13 3.87 - 5.11 MIL/uL   Hemoglobin 11.9 (L) 12.0 - 15.0 g/dL   HCT 13.0 86.5 - 78.4 %   MCV 93.9 80.0 - 100.0 fL   MCH 28.8 26.0 - 34.0 pg   MCHC 30.7 30.0 - 36.0 g/dL   RDW 69.6 29.5 - 28.4 %   Platelets 104 (L) 150 - 400 K/uL   nRBC 0.0 0.0 - 0.2 %    Comment: Performed  at [facility], [address] [place], [place] [address]  Comprehensive metabolic panel     Status: Abnormal   Collection Time: 09/13/23 12:36 AM  Result Value Ref Range   Sodium 132 (L) 135 - 145 mmol/L   Potassium 4.6 3.5 - 5.1 mmol/L   Chloride 100 98 - 111 mmol/L   CO2 23 22 - 32 mmol/L   Glucose, Bld 172 (H) 70 - 99 mg/dL    Comment: Glucose reference range applies only to samples taken after fasting for at least 8 hours.   BUN 25 (H) 8 - 23 mg/dL   Creatinine, Ser 4.78 (H) 0.44 - 1.00 mg/dL   Calcium 8.3 (L) 8.9 - 10.3 mg/dL   Total Protein 5.5 (L) 6.5 - 8.1 g/dL   Albumin 2.9 (L) 3.5 - 5.0 g/dL   AST 32 15 - 41 U/L   ALT 21 0 - 44 U/L   Alkaline Phosphatase 51 38 - 126 U/L   Total Bilirubin 0.6 <1.2 mg/dL   GFR, Estimated 33 (L) >60 mL/min    Comment: (NOTE) Calculated using the CKD-EPI Creatinine Equation (2021)    Anion gap 9 5 - 15    Comment: Performed at [facility], [address], [place], [place] [address]  Hemoglobin A1c     Status: Abnormal   Collection Time: 09/13/23 12:36 AM  Result Value Ref Range   Hgb A1c MFr Bld 11.9 (H) 4.8 - 5.6 %    Comment: (NOTE) Pre diabetes:          5.7%-6.4%  Diabetes:              >6.4%  Glycemic control for   <7.0% adults with diabetes    Mean Plasma Glucose 294.83 mg/dL    Comment: Performed at [facility], [address] [place], [place] [address]  Respiratory (~20 pathogens) panel by PCR     Status: None   Collection Time: 09/13/23 12:59 AM   Specimen: Nasopharyngeal Swab; Respiratory  Result Value Ref Range   Adenovirus NOT DETECTED NOT DETECTED   Coronavirus 229E NOT DETECTED NOT DETECTED    Comment: (NOTE) The Coronavirus on the Respiratory Panel, DOES NOT test for the novel  Coronavirus (2019 nCoV)    Coronavirus HKU1 NOT DETECTED NOT DETECTED   Coronavirus NL63 NOT DETECTED NOT DETECTED   Coronavirus OC43 NOT DETECTED NOT DETECTED   Metapneumovirus NOT DETECTED NOT DETECTED   Rhinovirus / Enterovirus NOT DETECTED NOT DETECTED   Influenza A NOT DETECTED NOT DETECTED   Influenza B NOT DETECTED NOT DETECTED   Parainfluenza Virus 1 NOT DETECTED NOT DETECTED   Parainfluenza Virus 2 NOT DETECTED NOT DETECTED   Parainfluenza Virus 3 NOT DETECTED NOT DETECTED   Parainfluenza Virus 4 NOT DETECTED NOT DETECTED   Respiratory Syncytial Virus NOT DETECTED NOT DETECTED   Bordetella pertussis NOT DETECTED NOT DETECTED   Bordetella Parapertussis NOT DETECTED NOT DETECTED   Chlamydophila pneumoniae NOT DETECTED NOT DETECTED   Mycoplasma pneumoniae NOT DETECTED NOT DETECTED    Comment: Performed at [facility], [address] [place], [place] [address]  Strep pneumoniae urinary antigen     Status: None   Collection Time: 09/13/23 12:59 AM  Result Value Ref Range   Strep Pneumo Urinary Antigen NEGATIVE NEGATIVE    Comment:        Infection due to S. pneumoniae cannot be absolutely ruled out since the  antigen present may be  below the detection limit of the test. Performed at [facility], [address] [place], [place] [address]   I-Stat arterial blood gas, ED     Status: Abnormal   Collection Time: 09/13/23  1:23 AM  Result Value Ref Range   pH, Arterial 7.289 (L) 7.35 - 7.45   pCO2 arterial 52.8 (H) 32 - 48 mmHg   pO2, Arterial 73 (L) 83 - 108 mmHg   Bicarbonate 25.5 20.0 - 28.0 mmol/L   TCO2 27 22 - 32 mmol/L   O2 Saturation 93 %   Acid-base deficit 2.0 0.0 - 2.0 mmol/L   Sodium 134 (L) 135 - 145 mmol/L   Potassium 4.0 3.5 - 5.1 mmol/L   Calcium, Ion 1.22 1.15 - 1.40 mmol/L   HCT 34.0 (L) 36.0 - 46.0 %   Hemoglobin 11.6 (L) 12.0 - 15.0 g/dL   Patient temperature 24.4 C    Collection site RADIAL, ALLEN'S TEST ACCEPTABLE    Drawn by RT    Sample type ARTERIAL   Lactic acid, plasma     Status: None   Collection Time: 09/13/23  2:37 AM  Result Value Ref Range   Lactic Acid, Venous 1.2 0.5 - 1.9 mmol/L    Comment: Performed at [facility], [address], [place], [place] [address]  CBG monitoring, ED     Status: Abnormal   Collection Time: 09/13/23  2:37 AM  Result Value Ref Range   Glucose-Capillary 171 (H) 70 - 99 mg/dL    Comment: Glucose reference range applies only to samples taken after fasting for at least 8 hours.  CBG monitoring, ED     Status: Abnormal   Collection Time: 09/13/23  7:34 AM  Result Value Ref Range   Glucose-Capillary 221 (H) 70 - 99 mg/dL    Comment: Glucose reference range applies only to samples taken after fasting for at least 8 hours.  SARS Coronavirus 2 by RT PCR (hospital order, performed in [facility] hospital lab) *cepheid single result test* Anterior Nasal Swab     Status: None   Collection Time: 09/13/23  8:11 AM   Specimen: Anterior Nasal Swab  Result Value Ref Range   SARS Coronavirus 2 by RT PCR NEGATIVE NEGATIVE    Comment: Performed at [facility], [address], [place], [place] [address]  CBG monitoring,  ED     Status: Abnormal   Collection Time: 09/13/23 11:18 AM  Result Value Ref Range   Glucose-Capillary 302 (H) 70 - 99 mg/dL    Comment: Glucose reference range applies only to samples taken after fasting for at least 8 hours.  CBG monitoring, ED     Status: Abnormal   Collection Time: 09/13/23 12:22 PM  Result Value Ref Range   Glucose-Capillary 322 (H) 70 - 99 mg/dL    Comment: Glucose reference range applies only to samples taken after fasting for at least 8 hours.   CT Angio Chest PE W/Cm &/Or Wo Cm  Result Date: 09/13/2023 CLINICAL DATA:  Shortness of breath. Positive D-dimer. PE suspected EXAM: CT ANGIOGRAPHY CHEST WITH CONTRAST TECHNIQUE: Multidetector CT imaging of the chest was performed using the standard protocol during bolus administration of intravenous contrast. Multiplanar CT image reconstructions and MIPs were obtained to evaluate the vascular anatomy. RADIATION DOSE REDUCTION: This exam was performed according to the departmental dose-optimization program which includes automated exposure control, adjustment of the mA and/or kV according to patient size and/or use of iterative reconstruction  technique. CONTRAST:  60mL OMNIPAQUE IOHEXOL 350 MG/ML SOLN COMPARISON:  Chest radiograph 09/12/2023 CT 03/29/2021 FINDINGS: Cardiovascular: No pericardial effusion. Aortic and coronary artery atherosclerotic calcification. Negative for acute pulmonary embolism. Mediastinum/Nodes: Esophagus is unremarkable. Bowing of the posterior tracheal compatible with expiration. 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 There is additional bowing of the posterior wall of the right mainstem bronchus. Calcified left hilar lymph nodes. Lungs/Pleura: Bronchial wall thickening and mucous plugging greatest in the lower lobes. Mosaic attenuation compatible with air trapping. Bibasilar atelectasis/scarring. No pleural effusion pneumothorax. Upper Abdomen: Hepatic steatosis.  No acute abnormality.  Musculoskeletal: No acute fracture. Review of the MIP images confirms the above findings. IMPRESSION: 1. Negative for acute pulmonary embolism. 2. Bronchitis/reactive airways. 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 Consider pulmonology referral. 3. Hepatic steatosis. Aortic Atherosclerosis (ICD10-I70.0). Electronically Signed   By: Minerva Fester M.D.   On: 09/13/2023 00:03   DG Chest Port 1 View  Result Date: 09/12/2023 CLINICAL DATA:  Shortness of breath x2 weeks. EXAM: PORTABLE CHEST 1 VIEW COMPARISON:  December 28, 2021 FINDINGS: The heart size and mediastinal contours are within normal limits. Low lung volumes are noted. Mild atelectasis is seen within the bilateral lung bases. There is no evidence of an acute infiltrate, pleural effusion or pneumothorax. The visualized skeletal structures are unremarkable. IMPRESSION: Low lung volumes with mild bibasilar atelectasis. Electronically Signed   By: Aram Candela M.D.   On: 09/12/2023 19:57    Pending Labs Unresulted Labs (From admission, onward)     Start     Ordered   09/13/23 0500  CBC  Daily,   R      09/13/23 0108   09/13/23 0500  Comprehensive metabolic panel  Daily,   R      09/13/23 0108   09/13/23 0440  MRSA Next Gen by PCR, Nasal  Once,   R        09/13/23 0440   09/13/23 0059  Expectorated Sputum Assessment w Gram Stain, Rflx to Resp Cult  Once,   R        09/13/23 0058   09/13/23 0059  Legionella Pneumophila Serogp 1 Ur Ag  Once,   R        09/13/23 0058            Vitals/Pain Today's Vitals   09/13/23 0945 09/13/23 1000 09/13/23 1025 09/13/23 1030  BP:  (!) 168/93  (!) 146/71  Pulse: 85 85  86  Resp: 14   18  Temp:   99.3 F (37.4 C)   TempSrc:   Oral   SpO2: 94% 92%  92%  Weight:      Height:      PainSc:        Isolation Precautions No active isolations  Medications Medications  aspirin  EC tablet 81 mg (81 mg Oral Given 09/13/23 0911)  atorvastatin (LIPITOR) tablet 40 mg (40 mg Oral Given 09/13/23 0911)  midodrine (PROAMATINE) tablet 10 mg (10 mg Oral Given 09/13/23 1117)  levothyroxine (SYNTHROID) tablet 75 mcg (75 mcg Oral Given 09/13/23 0600)  pantoprazole (PROTONIX) EC tablet 40 mg (has no administration in time range)  levalbuterol (XOPENEX) nebulizer solution 0.63 mg (has no administration in time range)  enoxaparin (LOVENOX) injection 40 mg (40 mg Subcutaneous Given 09/13/23 0911)  lactated ringers infusion ( Intravenous New Bag/Given 09/13/23 1117)  sodium chloride flush (NS) 0.9 % injection  3 mL (3 mLs Intravenous Given 09/13/23 0910)  sodium chloride flush (NS) 0.9 % injection 3 mL (has no administration in time range)  0.9 %  sodium chloride infusion (has no administration in time range)  acetaminophen (TYLENOL) tablet 650 mg (650 mg Oral Given 09/13/23 0626)    Or  acetaminophen (TYLENOL) suppository 650 mg ( Rectal See Alternative 09/13/23 0626)  ondansetron (ZOFRAN) tablet 4 mg (has no administration in time range)    Or  ondansetron (ZOFRAN) injection 4 mg (has no administration in time range)  insulin aspart (novoLOG) injection 0-15 Units (11 Units Subcutaneous Given 09/13/23 1121)  insulin aspart (novoLOG) injection 0-5 Units ( Subcutaneous Not Given 09/13/23 0242)  methylPREDNISolone sodium succinate (SOLU-MEDROL) 125 mg/2 mL injection 80 mg (has no administration in time range)  montelukast (SINGULAIR) tablet 10 mg (10 mg Oral Given 09/13/23 0226)  insulin glargine-yfgn (SEMGLEE) injection 45 Units (45 Units Subcutaneous Given 09/13/23 1003)  azithromycin (ZITHROMAX) 500 mg in dextrose 5 % 250 mL IVPB (has no administration in time range)  cefTRIAXone (ROCEPHIN) 2 g in sodium chloride 0.9 % 100 mL IVPB (0 g Intravenous Stopped 09/13/23 0958)  ipratropium-albuterol (DUONEB) 0.5-2.5 (3) MG/3ML nebulizer solution 3 mL (3 mLs Nebulization Given 09/13/23 0743)  guaiFENesin  (MUCINEX) 12 hr tablet 600 mg (600 mg Oral Given 09/13/23 1002)  ferrous sulfate tablet 325 mg (325 mg Oral Given 09/13/23 0743)  clonazePAM (KLONOPIN) tablet 0.5 mg (0.5 mg Oral Given 09/13/23 1032)  sodium chloride 0.9 % bolus 1,000 mL (0 mLs Intravenous Stopped 09/12/23 2018)  iohexol (OMNIPAQUE) 350 MG/ML injection 60 mL (60 mLs Intravenous Contrast Given 09/12/23 2106)  cefTRIAXone (ROCEPHIN) 1 g in sodium chloride 0.9 % 100 mL IVPB (0 g Intravenous Stopped 09/12/23 2351)  lactated ringers bolus 500 mL (0 mLs Intravenous Stopped 09/13/23 0005)  azithromycin (ZITHROMAX) 500 mg in dextrose 5 % 250 mL IVPB (0 mg Intravenous Stopped 09/13/23 0345)  lactated ringers bolus 500 mL (0 mLs Intravenous Stopped 09/13/23 0115)  albumin human 25 % solution 50 g (0 g Intravenous Stopped 09/13/23 0611)  methylPREDNISolone sodium succinate (SOLU-MEDROL) 125 mg/2 mL injection 125 mg (125 mg Intravenous Given 09/13/23 0229)    Mobility walks with person assist     Focused Assessments Pulmonary Assessment Handoff:  Lung sounds:   O2 Device: Nasal Cannula O2 Flow Rate (L/min): 3 L/min    R Recommendations: See Admitting Provider Note  Report given to:   Additional Notes:

## 2023-09-13 NOTE — Progress Notes (Signed)
Pt arrived to the unit, O2 at [facility], VS stable. C/o pain and nausea. Tylenol did not help, MD called and requested a few orders from the patient, including pain medication. Orders are in, pain slowly going down. Purewick was placed per pt request due to frequency and inability to hold urine. MD is arare.  Bed in low position, alrarms are on, call bell in reach, continue to monitor.

## 2023-09-13 NOTE — ED Notes (Signed)
Soiled linens and gown changed. Patient Assisted on the bedpan also.

## 2023-09-13 NOTE — ED Provider Notes (Signed)
Care assumed from Dr. Theresia Lo.  Patient here with hypoxia, hypotension and cyanosis at her pain management appointment.  Blood pressure has improved to 90 systolic after 1 L of IV fluid.  However, There was some concern initially for possible CHF exacerbation.  She does have a new O2 requirements and AKI.  CT PE study is pending given her hypoxia and low blood pressure.  CT study is negative for PE but does show changes of bronchitis and tracheobronchomalacia.  Will need admission for ongoing hypoxia.  Blood pressure 98/45.  Patient mentating well.  Lactate and blood cultures sent. Treated empirically for possible pneumonia.  Lactic acid is pending.  Most recent blood pressure 108/58.  She is mentating well.  Admission d/w Dr. Herold Harms, MD 09/13/23 [phone]

## 2023-09-13 NOTE — ED Notes (Signed)
Soiled linens and gown changed.

## 2023-09-13 NOTE — ED Notes (Signed)
Dr. Frederick Peers at bedside

## 2023-09-13 NOTE — H&P (Signed)
History and Physical    [name] ZOX:[id number] DOB: Jul 04, 1946 DOA: 09/12/2023  PCP: Alease Medina, MD   Patient coming from: Home   Chief Complaint:  Chief Complaint  Patient presents with   SOB   ED TRIAGE note:Pt BIB [facility] from [facility] where she was there to get her pain prescription refilled. While there she could not urinate & then after talking with provider told them she has also been SOB for 2 weeks & retaining fluid. Her RA in the office was in the 80's & only 88% on 4L O2, then 96% on NRB. EMS reports her lips were blue when she transferred to their stretcher from the office chair, rales in lower lobes, Hx of HF, 103/62, 58 bpm, A/Ox4.   HPI:  [name] is a 77 y.o. female with medical history significant of CAD with stent placement, grade 1 diastolic heart failure with preserved EF 55 to 60%, insulin-dependent DM type II, GERD, CKD stage IIIa, hypothyroidism, depression, hyperlipidemia, generalized anxiety disorder, neuropathy, functional diarrhe chronic anemia and chronic pain with use of chronic narcotics presented to emergency department for evaluation of shortness of breath.   Patient reported that she has been seen at the pain clinic today where she felt suddenly very sweaty and warm and oxygen saturation dropped to 80% and she was placed on a nonrebreather with improvement.  Patient reported that for last 3 weeks she is less mobile and has been gaining weight.  She also having shortness of breath for last few weeks as well.  She is also complaining of bilateral rib pain.  Stated that she vomited once 3 days ago but did not have any vomiting and nausea today.  Patient denies any bilateral lower extremity swelling.  Denies any chest pain, palpitation, cough, fever, abdominal pain, constipation, diarrhea.   ED Course:  At presentation to ED patient's heart rate 57, respiratory to 18, blood pressure soft 92/64 which dropped to 76/39 and respiratory  rate 16.  O2 sat 97% on 6 L.  Most recent blood pressure is 98/45, heart rate 58 and O2 sat 96%. CMP showing low sodium 133, low chloride 96, elevated blood glucose 195, BUN 26, creatinine 1.7 which is around baseline, GFR 31 and anion gap 12. Number BNP 19. Troponin x 2 are 6 within normal range.  Slightly elevated D-dimer at 0.59. UA leukocyte esterase positive bacteria many and has the appearance. CBC unremarkable except low platelet count 142. Pending lactic acid level. Pending blood cultures.  CTA chest: IMPRESSION: 1. Negative for acute pulmonary embolism. 2. Bronchitis/reactive airways. 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 Consider pulmonology referral. 3. Hepatic steatosis.  Chest x-ray low lung volume with mild bibasilar atelectasis.  In the ED patient has been treated with ceftriaxone azithromycin and 1.5 L of NS and LR bolus.  In the ED patient MAP maintaining 51-61.  Hospitalist has been contacted for further evaluation management of bronchitis/reactive airway disease, bronc tracheomalacia and hypotension.  Discussed case with  with on-call pulmonology Dr. Marchelle Gearing recommended treat patient with  steroid, bronchodilator, check Pro-Cal, sputum culture, respiratory panel, check ABG, given patient blood pressure is soft recommended to give fluid resuscitation.  Per pulmonology there is no actual cure for tracheobronchomalacia however we have to find out the cause of this exacerbation like pneumonia versus any viral cause.  Holding formal pulmonology consult at this time.  Review of Systems:  Review of Systems  Constitutional:  Negative for chills, fever, malaise/fatigue and weight loss.  Respiratory:  Positive for shortness of breath. Negative for cough, sputum production and wheezing.   Cardiovascular:  Negative for chest pain, claudication and leg  swelling.  Gastrointestinal:  Negative for abdominal pain, heartburn, nausea and vomiting.  Musculoskeletal:  Negative for myalgias and neck pain.  Endo/Heme/Allergies:  Does not bruise/bleed easily.  Psychiatric/Behavioral:  The patient is not nervous/anxious.     Past Medical History:  Diagnosis Date   Amnesia    Anxiety    Arthritis    Back pain    CHF (congestive heart failure) (HCC)    Chronic kidney disease    Acute on chronic 10/2020   Chronic pain syndrome    Cystocele    Depression    Diabetes mellitus without complication (HCC)    Type II   Diabetes mellitus without complication (HCC)    GERD (gastroesophageal reflux disease)    History of blood transfusion    with knee placements   Hyperlipidemia    Hypertension    IBS (irritable bowel syndrome)    Obesity    Osteoporosis    Osteoporosis    Panic attacks    Seizure (HCC)    Shortness of breath dyspnea    Sleep apnea    not using now   Thyroid nodule     Past Surgical History:  Procedure Laterality Date   ABDOMINAL HYSTERECTOMY     APPENDECTOMY     BACK SURGERY     CARDIAC CATHETERIZATION     CHOLECYSTECTOMY     CORONARY STENT INTERVENTION N/A 05/05/2018   Procedure: CORONARY STENT INTERVENTION;  Surgeon: Marcina Millard, MD;  Location: [facility] INVASIVE CV LAB;  Service: Cardiovascular;  Laterality: N/A;   FOOT SURGERY     LEFT HEART CATH AND CORONARY ANGIOGRAPHY Left 05/05/2018   Procedure: LEFT HEART CATH AND CORONARY ANGIOGRAPHY;  Surgeon: Marcina Millard, MD;  Location: [facility] INVASIVE CV LAB;  Service: Cardiovascular;  Laterality: Left;   OOPHORECTOMY     REPLACEMENT TOTAL KNEE     TONSILLECTOMY       reports that she has never smoked. She has never used smokeless tobacco. She reports that she does not drink alcohol and does not use drugs.  No Known Allergies  Family History  Problem Relation Age of Onset   Stomach cancer Mother    Cancer Father    Cancer - Lung Father    Kidney  cancer Sister    Thyroid disease Neg Hx     Prior to Admission medications   Medication Sig Start Date End Date Taking? Authorizing Provider  aspirin EC 81 MG tablet Take 81 mg by mouth daily. Swallow whole.    [provider]  atorvastatin (LIPITOR) 40 MG tablet Take 40 mg by mouth daily. 12/06/19   [provider]  busPIRone (BUSPAR) 30 MG tablet Take 30 mg by mouth 2 (two) times daily.    [provider]  calcium-vitamin D (OSCAL 500/200 D-3) 500-200 MG-UNIT tablet Take 1 tablet by mouth 2 (two) times daily. 02/20/21 02/20/22  Enedina Finner, MD  Cholecalciferol 25 MCG (1000 UT) tablet Take 1,000 Units by mouth daily.    [provider]  estradiol (ESTRACE) 0.1 MG/GM vaginal cream Apply one pea-sized amount around the opening of the urethra every day for 2 weeks, then 3 times weekly moving forward. 08/14/21   Carman Ching, PA-C  ferrous sulfate 325 (65  FE) MG tablet Take 325 mg by mouth 2 (two) times daily with a meal.    [provider]  furosemide (LASIX) 40 MG tablet Take 1 tablet (40 mg total) by mouth daily as needed. Please hold for next few days 01/01/22   Arnetha Courser, MD  gabapentin (NEURONTIN) 600 MG tablet Take 1,200 mg by mouth 2 (two) times daily.    [provider]  LANTUS SOLOSTAR 100 UNIT/ML Solostar Pen Inject 15 Units into the skin at bedtime. 02/20/21   Enedina Finner, MD  levothyroxine (SYNTHROID) 75 MCG tablet Take 1 tablet (75 mcg total) by mouth daily before breakfast. 02/08/20   Arnetha Courser, MD  lidocaine (LIDODERM) 5 % Place 2 patches onto the skin daily. Remove & Discard patch within 12 hours or as directed by MD 01/01/22   Arnetha Courser, MD  memantine (NAMENDA XR) 28 MG CP24 24 hr capsule Take 28 mg by mouth daily. 12/19/21   [provider]  metFORMIN (GLUCOPHAGE) 1000 MG tablet Take 1,000 mg by mouth 2 (two) times daily with a meal.    [provider]  metoprolol succinate (TOPROL-XL) 50 MG 24 hr  tablet Take 50 mg by mouth daily. Take with or immediately following a meal.    [provider]  midodrine (PROAMATINE) 10 MG tablet Take 1 tablet (10 mg total) by mouth 3 (three) times daily with meals. 01/01/22   Arnetha Courser, MD  montelukast (SINGULAIR) 10 MG tablet SMARTSIG:1 Tablet(s) By Mouth Every Evening 12/09/21   [provider]  NOVOLOG FLEXPEN 100 UNIT/ML FlexPen SMARTSIG:15 Unit(s) SUB-Q Daily 11/29/21   [provider]  oxyCODONE-acetaminophen (PERCOCET/ROXICET) 5-325 MG tablet Take 1-2 tablets by mouth every 8 (eight) hours as needed for moderate pain or severe pain. 01/01/22   Arnetha Courser, MD  pantoprazole (PROTONIX) 40 MG tablet Take 40 mg by mouth daily as needed (heartburn).    [provider]  pioglitazone (ACTOS) 15 MG tablet Take 15 mg by mouth daily. 11/14/21   [provider]  tolterodine (DETROL LA) 2 MG 24 hr capsule Take 2 mg by mouth daily. 09/21/21   [provider]  topiramate (TOPAMAX) 200 MG tablet Take 200 mg by mouth at bedtime. 12/22/20   [provider]  TRULICITY 1.5 MG/0.5ML SOPN Inject 1.5 mg into the skin once a week. 11/17/21   [provider]  VIIBRYD 40 MG TABS Take 40 mg by mouth daily. 02/01/21   [provider]     Physical Exam: Vitals:   09/12/23 2315 09/12/23 2320 09/12/23 2330 09/13/23 0000  BP: (!) 88/32 (!) 98/45 (!) 108/58 (!) 101/52  Pulse: (!) 57 (!) 58 (!) 59 (!) 57  Resp:  16    Temp:  97.6 F (36.4 C)    TempSrc:  Oral    SpO2: 94% 96% 97% 96%  Weight:      Height:        Physical Exam Constitutional:      General: She is not in acute distress.    Appearance: She is obese. She is not ill-appearing.  HENT:     Nose: Nose normal.     Mouth/Throat:     Mouth: Mucous membranes are dry.  Eyes:     Conjunctiva/sclera: Conjunctivae normal.  Cardiovascular:     Rate and Rhythm: Normal rate and regular rhythm.     Pulses: Normal pulses.     Heart sounds:  Normal heart sounds.  Pulmonary:     Effort:  Pulmonary effort is normal.     Breath sounds: Normal breath sounds. No wheezing, rhonchi or rales.  Musculoskeletal:     Cervical back: Neck supple.     Right lower leg: No edema.     Left lower leg: No edema.  Skin:    General: Skin is warm and dry.     Capillary Refill: Capillary refill takes less than 2 seconds.  Neurological:     Mental Status: She is alert and oriented to person, place, and time.  Psychiatric:        Mood and Affect: Mood normal.      Labs on Admission: I have personally reviewed following labs and imaging studies  CBC: Recent Labs  Lab 09/12/23 1711 09/13/23 0036 09/13/23 0123  WBC 8.3 8.3  --   NEUTROABS 4.6  --   --   HGB 13.6 11.9* 11.6*  HCT 42.7 38.8 34.0*  MCV 91.0 93.9  --   PLT 142* 104*  --    Basic Metabolic Panel: Recent Labs  Lab 09/12/23 1711 09/13/23 0036 09/13/23 0123  NA 133* 132* 134*  K 4.4 4.6 4.0  CL 96* 100  --   CO2 25 23  --   GLUCOSE 195* 172*  --   BUN 26* 25*  --   CREATININE 1.70* 1.61*  --   CALCIUM 9.3 8.3*  --    GFR: Estimated Creatinine Clearance: 37.6 mL/min (A) (by C-G formula based on SCr of 1.61 mg/dL (H)). Liver Function Tests: Recent Labs  Lab 09/12/23 1711 09/13/23 0036  AST 37 32  ALT 28 21  ALKPHOS 64 51  BILITOT 0.7 0.6  PROT 7.0 5.5*  ALBUMIN 3.5 2.9*   No results for input(s): "LIPASE", "AMYLASE" in the last 168 hours. No results for input(s): "AMMONIA" in the last 168 hours. Coagulation Profile: No results for input(s): "INR", "PROTIME" in the last 168 hours. Cardiac Enzymes: Recent Labs  Lab 09/12/23 1711 09/12/23 2016  TROPONINIHS 6 6   BNP (last 3 results) Recent Labs    09/12/23 1711  BNP 19.9   HbA1C: Recent Labs    09/13/23 0036  HGBA1C 11.9*   CBG: Recent Labs  Lab 09/13/23 0237  GLUCAP 171*   Lipid Profile: No results for input(s): "CHOL", "HDL", "LDLCALC", "TRIG", "CHOLHDL", "LDLDIRECT" in the last 72  hours. Thyroid Function Tests: No results for input(s): "TSH", "T4TOTAL", "FREET4", "T3FREE", "THYROIDAB" in the last 72 hours. Anemia Panel: No results for input(s): "VITAMINB12", "FOLATE", "FERRITIN", "TIBC", "IRON", "RETICCTPCT" in the last 72 hours. Urine analysis:    Component Value Date/Time   COLORURINE YELLOW 09/12/2023 2249   APPEARANCEUR HAZY (A) 09/12/2023 2249   APPEARANCEUR Cloudy (A) 08/27/2021 1432   LABSPEC 1.021 09/12/2023 2249   LABSPEC 1.024 01/30/2014 0026   PHURINE 5.0 09/12/2023 2249   GLUCOSEU NEGATIVE 09/12/2023 2249   GLUCOSEU >=500 01/30/2014 0026   HGBUR NEGATIVE 09/12/2023 2249   BILIRUBINUR NEGATIVE 09/12/2023 2249   BILIRUBINUR Negative 08/27/2021 1432   BILIRUBINUR Negative 01/30/2014 0026   KETONESUR NEGATIVE 09/12/2023 2249   PROTEINUR NEGATIVE 09/12/2023 2249   UROBILINOGEN 1.0 07/11/2008 1245   NITRITE NEGATIVE 09/12/2023 2249   LEUKOCYTESUR TRACE (A) 09/12/2023 2249   LEUKOCYTESUR Trace 01/30/2014 0026    Radiological Exams on Admission: I have personally reviewed images CT Angio Chest PE W/Cm &/Or Wo Cm  Result Date: 09/13/2023 CLINICAL DATA:  Shortness of breath. Positive D-dimer. PE suspected EXAM: CT ANGIOGRAPHY CHEST WITH CONTRAST TECHNIQUE: Multidetector CT imaging  of the chest was performed using the standard protocol during bolus administration of intravenous contrast. Multiplanar CT image reconstructions and MIPs were obtained to evaluate the vascular anatomy. RADIATION DOSE REDUCTION: This exam was performed according to the departmental dose-optimization program which includes automated exposure control, adjustment of the mA and/or kV according to patient size and/or use of iterative reconstruction technique. CONTRAST:  60mL OMNIPAQUE IOHEXOL 350 MG/ML SOLN COMPARISON:  Chest radiograph 09/12/2023 CT 03/29/2021 FINDINGS: Cardiovascular: No pericardial effusion. Aortic and coronary artery atherosclerotic calcification. Negative for acute  pulmonary embolism. Mediastinum/Nodes: Esophagus is unremarkable. Bowing of the posterior tracheal compatible with expiration. 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 There is additional bowing of the posterior wall of the right mainstem bronchus. Calcified left hilar lymph nodes. Lungs/Pleura: Bronchial wall thickening and mucous plugging greatest in the lower lobes. Mosaic attenuation compatible with air trapping. Bibasilar atelectasis/scarring. No pleural effusion pneumothorax. Upper Abdomen: Hepatic steatosis.  No acute abnormality. Musculoskeletal: No acute fracture. Review of the MIP images confirms the above findings. IMPRESSION: 1. Negative for acute pulmonary embolism. 2. Bronchitis/reactive airways. 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 Consider pulmonology referral. 3. Hepatic steatosis. Aortic Atherosclerosis (ICD10-I70.0). Electronically Signed   By: Minerva Fester M.D.   On: 09/13/2023 00:03   DG Chest Port 1 View  Result Date: 09/12/2023 CLINICAL DATA:  Shortness of breath x2 weeks. EXAM: PORTABLE CHEST 1 VIEW COMPARISON:  December 28, 2021 FINDINGS: The heart size and mediastinal contours are within normal limits. Low lung volumes are noted. Mild atelectasis is seen within the bilateral lung bases. There is no evidence of an acute infiltrate, pleural effusion or pneumothorax. The visualized skeletal structures are unremarkable. IMPRESSION: Low lung volumes with mild bibasilar atelectasis. Electronically Signed   By: Aram Candela M.D.   On: 09/12/2023 19:57    EKG: My personal interpretation of EKG shows: EKG showing normal sinus rhythm heart rate 57.  Abnormal R progression and low voltage QRS.    Assessment/Plan: Principal Problem:   Tracheobronchomalacia Active Problems:   Hypotensive episode   Acute hypoxic respiratory failure (HCC)   Bronchitis    HLD (hyperlipidemia)   GERD (gastroesophageal reflux disease)   Chronic pain syndrome   Hypothyroidism   GAD (generalized anxiety disorder)   Chronic anemia   Insulin dependent type 2 diabetes mellitus (HCC)   SIRS (systemic inflammatory response syndrome) (HCC)   Diastolic heart failure (HCC)   CKD (chronic kidney disease) stage 3, GFR 30-59 ml/min (HCC)    Assessment and Plan: Tracheobronchomalacia Reactive airway disease Bronchitis Acute hypoxic respiratory failure Respiratory acidosis in the setting of acute hypoxic respiratory failure SIRS -Patient presenting with complaining of shortness of breath and her O2 sat dropped to 80% at outpatient pain clinic as well as found to have soft blood pressure systolic upper 70s and diastolic upper 50s range. - In the ED patient heart rate remained between 56-57, respiratory rate 18-20, MAP between 51-61 and O2 sat 96 to 97% on 6 L oxygen. -ABG pH 7.2, pCO2 52, pO2 73 and bicarb 25. - Chest x-ray showed low lung volume with mild bibasilar atelectasis - CTA chest obtained which showed negative for pulmonary embolism however it showed  Bronchitis/reactive airways. 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 -CBC no leukocytosis.  Lactic acid within normal range. -Discussed case with on-call pulmonology  Dr. Marchelle Gearing recommended check respiratory panel, Pro-Cal level, check ABG.  Treat patient with a steroid and bronchodilator.  Informed tracheobronchomalacia usually happen in the setting of respiratory insult which needs to to be treated with a steroid and bronchodilator I recommended IV antibiotic. - Plan to continue IV azithromycin 500 mg and IV ceftriaxone 2 g daily for 7 days. - Checking respiratory panel, nasal MRSA PCR, blood cultures, sputum cultures, urine Legionella and urine strep antigen. -Giving Solu-Medrol 125 mg one-time dose  and continue steroid 80 mg daily for 4 days. - Continue DuoNeb every 6 hours scheduled and Xopenex every 6 hour as needed for wheezing shortness of breath. -Continue home Singulair 10 mg daily. -Continue pulmonary toiletry, spirometry and Mucinex. -Continue aspiration precaution. -Continue cardiac monitoring. - Admitting patient to progressive unit.  Hypotensive episode - Since presentation to ED patient MAP between 51-61.  In the ED patient received total 2 L of NS and LR bolus.  Blood pressure eventually has been improved to 101/52 at rate 57. -Normal lactic acid. -Etiology of hypotension not completely clear however suspecting it is combination of blood pressure regimen and pain medication side effect. - Holding home blood pressure regimen Toprol-XL 50 mg and Lasix. -Even though patient's home medication showing patient takes midodrine 10 mg 3 times daily patient reported she cannot remember it. - Initiating midodrine 10 mg daily and continue maintenance fluid LR 125 cc/h for 1 day.  Also giving albumin 50 g one-time dose. - Avoid any pain medications.  At home patient is on Norco and gabapentin.  Holding these for now. -Checking morning cortisol level. -Continue to monitor blood pressure and will treat hypotension accordingly.  Essential hypertension Grade 1 diastolic heart failure preserved EF 55 to 60% -Patient blood pressure is soft MAP between 51 to 61.  Holding Toprol-XL and Lasix at this moment.  AKI on CKD stage 3a Creatinine 1.7.  Baseline creatinine 1.1-1.3 and baseline GFR in between 41 to 51.  Prerenal acute kidney injury in the setting of hypotension. - Continue IV fluid LR 125 cc/h. -Continue to monitor renal function.  Monitor urine output.  Avoid avoid nephrotoxic agent.  Hyponatremia - Serum sodium 133 and hypochloremia 96.  Unclear etiology of development of hyponatremia at this time except patient takes Lasix at home which might contributing development of  hyponatremia. - Continue to monitor serum sodium level.  Hyperlipidemia -Continue Lipitor  Chronic pain syndrome -Holding gabapentin in the setting of hypotension.  At home patient takes gabapentin 1200 mg twice daily.  Hypothyroidism -Continue levothyroxine 75 mcg daily.  Generalized anxiety disorder -Continue BuSpar 30 mg twice daily.  Anemia of chronic disease -Continue oral iron daily.  Insulin-dependent DM type II -Patient reported taking long-acting insulin 90 units twice daily.  Verified with pharmacy which is accurate.  CMP showing blood glucose 195.   -Starting Semglee 45 units twice daily as of now and will titrate based on the blood glucose level.  Given patient is treating with high-dose steroid there is not risk of development of hyperglycemia and might need to readjust insulin regimen.  DVT prophylaxis:  Lovenox Code Status:  Full Code Diet: Heart healthy carb modified diet Family Communication: There is no family member at bedside now Disposition Plan: Continue to monitor improvement of acute hypoxic respiratory failure.  Pending clinical disposition. Consults:  none at this moment. Admission status:   Inpatient, Step Down Unit  Severity of Illness: The appropriate patient status for this patient is INPATIENT. Inpatient status is judged  to be reasonable and necessary in order to provide the required intensity of service to ensure the patient's safety. The patient's presenting symptoms, physical exam findings, and initial radiographic and laboratory data in the context of their chronic comorbidities is felt to place them at high risk for further clinical deterioration. Furthermore, it is not anticipated that the patient will be medically stable for discharge from the hospital within 2 midnights of admission.   * I certify that at the point of admission it is my clinical judgment that the patient will require inpatient hospital care spanning beyond 2 midnights from the  point of admission due to high intensity of service, high risk for further deterioration and high frequency of surveillance required.Marland Kitchen    Tereasa Coop, MD [facility]  How to contact the [facility] Attending or Consulting provider 7A - 7P or covering provider during after hours 7P -7A, for this patient.  Check the care team in [facility] and look for a) attending/consulting [facility] provider listed and b) the [facility] team listed Log into [email or web address] and use [facility] universal password to access. If you do not have the password, please contact the hospital operator. Locate the [facility] provider you are looking for under [facility] and page to a number that you can be directly reached. If you still have difficulty reaching the provider, please page the [facility] (Director on Call) for the Hospitalists listed on amion for assistance.  09/13/2023, 3:36 AM

## 2023-09-13 NOTE — Progress Notes (Signed)
Progress Note    [name]   HKV:[id number]  DOB: 22-Feb-1946  DOA: 09/12/2023     0 PCP: Alease Medina, MD  Initial CC: SOB  Hospital Course: Ms.Imai is a 77 yo female with PMH obesity, dCHF, CAD, CKD, depression/anxiety, DM II, HTN, HLD, IBS, hypothyroidism who presented with shortness of breath.  She was brought via EMS from [facility] when picking up pain prescription refills.  She had endorsed shortness of breath for approximately 2 weeks and worsening edema. She was noted to be hypoxic in the low 80s and was placed on 4 L oxygen then nonrebreather.  Lips were also cyanotic on workup. CT angio chest was obtained which was negative for PE and showed tracheobronchomalacia.  Case was discussed with pulmonology on admission and she was recommended for steroids, bronchodilators, and infectious workup.  Interval History:  Resting comfortably in ER on O2. Breathing better.   Assessment and Plan:  Tracheobronchomalacia Reactive airway disease Bronchitis Acute hypoxic respiratory failure Respiratory acidosis in the setting of acute hypoxic respiratory failure SIRS -Patient presenting with complaining of shortness of breath and her O2 sat dropped to 80% at outpatient pain clinic as well as found to have soft blood pressure systolic upper 70s and diastolic upper 50s range. -ABG pH 7.2, pCO2 52, pO2 73 - Chest x-ray showed low lung volume with mild bibasilar atelectasis - CTA chest obtained which showed negative for pulmonary embolism however it showed  Bronchitis/reactive airways. 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 -CBC no leukocytosis.  Lactic acid within normal range. -Discussed case with on-call pulmonology Dr. Marchelle Gearing recommended check respiratory panel, Pro-Cal level, check ABG.  Treat patient with a steroid and bronchodilator.  Informed  tracheobronchomalacia usually happen in the setting of respiratory insult which needs to to be treated with a steroid and bronchodilator -Continue Rocephin and azithromycin for now.  Will de-escalate to oral regimen at discharge -RVP negative, COVID-negative, strep pneumo urinary antigen negative, procalcitonin negative -Continue steroids and breathing treatments - Continue Singulair   Hypotensive episode - Since presentation to ED patient MAP between 51-61.  In the ED patient received total 2 L of NS and LR bolus.  Blood pressure now improved  -Normal lactic acid. -Etiology of hypotension not completely clear however suspecting it is combination of blood pressure regimen and pain medication side effect. - Holding home blood pressure regimen Toprol-XL 50 mg and Lasix. -Continue midodrine for now. Patient uncertain if on her home regimen  Low cortisol - no chronic steroid use; no prior known history of AI - suspect low in setting of acute illness  - unable to perform stim test due to use of steroids currently - will need to have ACTH stim test after weaned from steroids and watching BP response while steroids weaned   Essential hypertension Grade 1 diastolic heart failure preserved EF 55 to 60% - Holding Toprol-XL and Lasix in setting of hypotension on admission   AKI on CKD stage 3a Creatinine 1.7.  Baseline creatinine 1.1-1.3 and baseline GFR in between 41 to 51.  Prerenal acute kidney injury in the setting of hypotension. - s/p IVF  - trend BMP  Insulin-dependent DM type II -Patient reported taking long-acting insulin 90 units twice daily.  Verified with pharmacy which is accurate -Starting Semglee 45 units twice daily and will adjust as necessary    Hyponatremia - Serum sodium 133 and hypochloremia 96.  Unclear etiology of development of hyponatremia at this time except patient takes Lasix at home which might contributing development of hyponatremia. AI could explain but onset  seems more acute  - Continue to monitor serum sodium level.   Hyperlipidemia -Continue Lipitor   Chronic pain syndrome -Holding gabapentin in the setting of hypotension.  At home patient takes gabapentin 1200 mg twice daily.   Hypothyroidism -Continue levothyroxine 75 mcg daily.   Generalized anxiety disorder -Continue BuSpar 30 mg twice daily. -Database reviewed.  Resume clonazepam as well   Anemia of chronic disease -Continue oral iron daily.    Old records reviewed in assessment of this patient  Antimicrobials: Rocephin 09/12/2023 >> current Azithromycin 09/13/2023 >> current  DVT prophylaxis:  enoxaparin (LOVENOX) injection 40 mg Start: 09/13/23 1000 SCDs Start: 09/13/23 0108   Code Status:   Code Status: Full Code  Mobility Assessment (Last 72 Hours)     Mobility Assessment   No documentation.           Barriers to discharge: none Disposition Plan:  Home Status is: Inpt  Objective: Blood pressure (!) 146/71, pulse 86, temperature 99.3 F (37.4 C), temperature source Oral, resp. rate 18, height 5\' 4"  (1.626 m), weight 118.4 kg, SpO2 92%.  Examination:  Physical Exam Constitutional:      General: She is not in acute distress.    Appearance: Normal appearance. She is obese.  HENT:     Head: Normocephalic and atraumatic.     Mouth/Throat:     Mouth: Mucous membranes are moist.  Eyes:     Extraocular Movements: Extraocular movements intact.  Cardiovascular:     Rate and Rhythm: Normal rate and regular rhythm.  Pulmonary:     Effort: Pulmonary effort is normal. No respiratory distress.     Breath sounds: Normal breath sounds. No wheezing.  Abdominal:     General: Bowel sounds are normal. There is no distension.     Palpations: Abdomen is soft.     Tenderness: There is no abdominal tenderness.  Musculoskeletal:        General: Swelling (3+ B/L LE edema) present. Normal range of motion.     Cervical back: Normal range of motion and neck supple.   Skin:    General: Skin is warm and dry.  Neurological:     General: No focal deficit present.     Mental Status: She is alert.  Psychiatric:        Mood and Affect: Mood normal.      Consultants:    Procedures:    Data Reviewed: Results for orders placed or performed during the hospital encounter of 09/12/23 (from the past 24 hour(s))  Comprehensive metabolic panel     Status: Abnormal   Collection Time: 09/12/23  5:11 PM  Result Value Ref Range   Sodium 133 (L) 135 - 145 mmol/L   Potassium 4.4 3.5 - 5.1 mmol/L   Chloride 96 (L) 98 - 111 mmol/L   CO2 25 22 - 32 mmol/L   Glucose, Bld 195 (H) 70 - 99 mg/dL   BUN 26 (H) 8 - 23 mg/dL   Creatinine, Ser 5.78 (H) 0.44 - 1.00 mg/dL   Calcium 9.3 8.9 - 46.9 mg/dL   Total Protein 7.0 6.5 - 8.1 g/dL   Albumin 3.5 3.5 - 5.0 g/dL   AST 37 15 - 41 U/L   ALT 28 0 - 44 U/L   Alkaline Phosphatase 64 38 - 126 U/L   Total Bilirubin 0.7 <  1.2 mg/dL   GFR, Estimated 31 (L) >60 mL/min   Anion gap 12 5 - 15  Brain natriuretic peptide     Status: None   Collection Time: 09/12/23  5:11 PM  Result Value Ref Range   B Natriuretic Peptide 19.9 0.0 - 100.0 pg/mL  Troponin I (High Sensitivity)     Status: None   Collection Time: 09/12/23  5:11 PM  Result Value Ref Range   Troponin I (High Sensitivity) 6 <18 ng/L  CBC with Differential     Status: Abnormal   Collection Time: 09/12/23  5:11 PM  Result Value Ref Range   WBC 8.3 4.0 - 10.5 K/uL   RBC 4.69 3.87 - 5.11 MIL/uL   Hemoglobin 13.6 12.0 - 15.0 g/dL   HCT 11.9 14.7 - 82.9 %   MCV 91.0 80.0 - 100.0 fL   MCH 29.0 26.0 - 34.0 pg   MCHC 31.9 30.0 - 36.0 g/dL   RDW 56.2 13.0 - 86.5 %   Platelets 142 (L) 150 - 400 K/uL   nRBC 0.0 0.0 - 0.2 %   Neutrophils Relative % 54 %   Neutro Abs 4.6 1.7 - 7.7 K/uL   Lymphocytes Relative 33 %   Lymphs Abs 2.7 0.7 - 4.0 K/uL   Monocytes Relative 9 %   Monocytes Absolute 0.8 0.1 - 1.0 K/uL   Eosinophils Relative 2 %   Eosinophils Absolute 0.2 0.0  - 0.5 K/uL   Basophils Relative 1 %   Basophils Absolute 0.1 0.0 - 0.1 K/uL   Immature Granulocytes 1 %   Abs Immature Granulocytes 0.05 0.00 - 0.07 K/uL  D-dimer, quantitative     Status: Abnormal   Collection Time: 09/12/23  5:14 PM  Result Value Ref Range   D-Dimer, Quant 0.59 (H) 0.00 - 0.50 ug/mL-FEU  Troponin I (High Sensitivity)     Status: None   Collection Time: 09/12/23  8:16 PM  Result Value Ref Range   Troponin I (High Sensitivity) 6 <18 ng/L  Urinalysis, w/ Reflex to Culture (Infection Suspected) -Urine, Clean Catch     Status: Abnormal   Collection Time: 09/12/23 10:49 PM  Result Value Ref Range   Specimen Source URINE, CLEAN CATCH    Color, Urine YELLOW YELLOW   APPearance HAZY (A) CLEAR   Specific Gravity, Urine 1.021 1.005 - 1.030   pH 5.0 5.0 - 8.0   Glucose, UA NEGATIVE NEGATIVE mg/dL   Hgb urine dipstick NEGATIVE NEGATIVE   Bilirubin Urine NEGATIVE NEGATIVE   Ketones, ur NEGATIVE NEGATIVE mg/dL   Protein, ur NEGATIVE NEGATIVE mg/dL   Nitrite NEGATIVE NEGATIVE   Leukocytes,Ua TRACE (A) NEGATIVE   RBC / HPF 0-5 0 - 5 RBC/hpf   WBC, UA 6-10 0 - 5 WBC/hpf   Bacteria, UA MANY (A) NONE SEEN   Squamous Epithelial / HPF 0-5 0 - 5 /HPF   Mucus PRESENT    Hyaline Casts, UA PRESENT   Lactic acid, plasma     Status: None   Collection Time: 09/13/23 12:20 AM  Result Value Ref Range   Lactic Acid, Venous 1.7 0.5 - 1.9 mmol/L  Blood culture (routine x 2)     Status: None (Preliminary result)   Collection Time: 09/13/23 12:20 AM   Specimen: BLOOD RIGHT HAND  Result Value Ref Range   Specimen Description BLOOD RIGHT HAND    Special Requests      BOTTLES DRAWN AEROBIC AND ANAEROBIC Blood Culture adequate volume   Culture  NO GROWTH < 12 HOURS Performed at [facility], [address] [place], [place] [address]    Report Status PENDING   Blood culture (routine x 2)     Status: None (Preliminary result)   Collection Time: 09/13/23 12:34 AM   Specimen:  BLOOD LEFT HAND  Result Value Ref Range   Specimen Description BLOOD LEFT HAND    Special Requests      BOTTLES DRAWN AEROBIC AND ANAEROBIC Blood Culture adequate volume   Culture      NO GROWTH < 12 HOURS Performed at [facility], [address], [place], [place] [address]    Report Status PENDING   Procalcitonin     Status: None   Collection Time: 09/13/23 12:36 AM  Result Value Ref Range   Procalcitonin <0.10 ng/mL  Cortisol-am, blood     Status: Abnormal   Collection Time: 09/13/23 12:36 AM  Result Value Ref Range   Cortisol - AM 2.3 (L) 6.7 - 22.6 ug/dL  CBC     Status: Abnormal   Collection Time: 09/13/23 12:36 AM  Result Value Ref Range   WBC 8.3 4.0 - 10.5 K/uL   RBC 4.13 3.87 - 5.11 MIL/uL   Hemoglobin 11.9 (L) 12.0 - 15.0 g/dL   HCT 51.8 84.1 - 66.0 %   MCV 93.9 80.0 - 100.0 fL   MCH 28.8 26.0 - 34.0 pg   MCHC 30.7 30.0 - 36.0 g/dL   RDW 63.0 16.0 - 10.9 %   Platelets 104 (L) 150 - 400 K/uL   nRBC 0.0 0.0 - 0.2 %  Comprehensive metabolic panel     Status: Abnormal   Collection Time: 09/13/23 12:36 AM  Result Value Ref Range   Sodium 132 (L) 135 - 145 mmol/L   Potassium 4.6 3.5 - 5.1 mmol/L   Chloride 100 98 - 111 mmol/L   CO2 23 22 - 32 mmol/L   Glucose, Bld 172 (H) 70 - 99 mg/dL   BUN 25 (H) 8 - 23 mg/dL   Creatinine, Ser 3.23 (H) 0.44 - 1.00 mg/dL   Calcium 8.3 (L) 8.9 - 10.3 mg/dL   Total Protein 5.5 (L) 6.5 - 8.1 g/dL   Albumin 2.9 (L) 3.5 - 5.0 g/dL   AST 32 15 - 41 U/L   ALT 21 0 - 44 U/L   Alkaline Phosphatase 51 38 - 126 U/L   Total Bilirubin 0.6 <1.2 mg/dL   GFR, Estimated 33 (L) >60 mL/min   Anion gap 9 5 - 15  Hemoglobin A1c     Status: Abnormal   Collection Time: 09/13/23 12:36 AM  Result Value Ref Range   Hgb A1c MFr Bld 11.9 (H) 4.8 - 5.6 %   Mean Plasma Glucose 294.83 mg/dL  Respiratory (~55 pathogens) panel by PCR     Status: None   Collection Time: 09/13/23 12:59 AM   Specimen: Nasopharyngeal Swab; Respiratory  Result Value  Ref Range   Adenovirus NOT DETECTED NOT DETECTED   Coronavirus 229E NOT DETECTED NOT DETECTED   Coronavirus HKU1 NOT DETECTED NOT DETECTED   Coronavirus NL63 NOT DETECTED NOT DETECTED   Coronavirus OC43 NOT DETECTED NOT DETECTED   Metapneumovirus NOT DETECTED NOT DETECTED   Rhinovirus / Enterovirus NOT DETECTED NOT DETECTED   Influenza A NOT DETECTED NOT DETECTED   Influenza B NOT DETECTED NOT DETECTED   Parainfluenza Virus 1 NOT DETECTED NOT DETECTED   Parainfluenza Virus 2 NOT DETECTED NOT DETECTED   Parainfluenza  Virus 3 NOT DETECTED NOT DETECTED   Parainfluenza Virus 4 NOT DETECTED NOT DETECTED   Respiratory Syncytial Virus NOT DETECTED NOT DETECTED   Bordetella pertussis NOT DETECTED NOT DETECTED   Bordetella Parapertussis NOT DETECTED NOT DETECTED   Chlamydophila pneumoniae NOT DETECTED NOT DETECTED   Mycoplasma pneumoniae NOT DETECTED NOT DETECTED  Strep pneumoniae urinary antigen     Status: None   Collection Time: 09/13/23 12:59 AM  Result Value Ref Range   Strep Pneumo Urinary Antigen NEGATIVE NEGATIVE  I-Stat arterial blood gas, ED     Status: Abnormal   Collection Time: 09/13/23  1:23 AM  Result Value Ref Range   pH, Arterial 7.289 (L) 7.35 - 7.45   pCO2 arterial 52.8 (H) 32 - 48 mmHg   pO2, Arterial 73 (L) 83 - 108 mmHg   Bicarbonate 25.5 20.0 - 28.0 mmol/L   TCO2 27 22 - 32 mmol/L   O2 Saturation 93 %   Acid-base deficit 2.0 0.0 - 2.0 mmol/L   Sodium 134 (L) 135 - 145 mmol/L   Potassium 4.0 3.5 - 5.1 mmol/L   Calcium, Ion 1.22 1.15 - 1.40 mmol/L   HCT 34.0 (L) 36.0 - 46.0 %   Hemoglobin 11.6 (L) 12.0 - 15.0 g/dL   Patient temperature 95.6 C    Collection site RADIAL, ALLEN'S TEST ACCEPTABLE    Drawn by RT    Sample type ARTERIAL   Lactic acid, plasma     Status: None   Collection Time: 09/13/23  2:37 AM  Result Value Ref Range   Lactic Acid, Venous 1.2 0.5 - 1.9 mmol/L  CBG monitoring, ED     Status: Abnormal   Collection Time: 09/13/23  2:37 AM  Result  Value Ref Range   Glucose-Capillary 171 (H) 70 - 99 mg/dL  CBG monitoring, ED     Status: Abnormal   Collection Time: 09/13/23  7:34 AM  Result Value Ref Range   Glucose-Capillary 221 (H) 70 - 99 mg/dL  SARS Coronavirus 2 by RT PCR (hospital order, performed in [facility] hospital lab) *cepheid single result test* Anterior Nasal Swab     Status: None   Collection Time: 09/13/23  8:11 AM   Specimen: Anterior Nasal Swab  Result Value Ref Range   SARS Coronavirus 2 by RT PCR NEGATIVE NEGATIVE  CBG monitoring, ED     Status: Abnormal   Collection Time: 09/13/23 11:18 AM  Result Value Ref Range   Glucose-Capillary 302 (H) 70 - 99 mg/dL    I have reviewed pertinent nursing notes, vitals, labs, and images as necessary. I have ordered labwork to follow up on as indicated.  I have reviewed the last notes from staff over past 24 hours. I have discussed patient's care plan and test results with nursing staff, CM/SW, and other staff as appropriate.  Time spent: Greater than 50% of the 55 minute visit was spent in counseling/coordination of care for the patient as laid out in the A&P.   LOS: 0 days   Lewie Chamber, MD [facility] 09/13/2023, 12:15 PM

## 2023-09-14 DIAGNOSIS — R6510 Systemic inflammatory response syndrome (SIRS) of non-infectious origin without acute organ dysfunction: Secondary | ICD-10-CM | POA: Diagnosis not present

## 2023-09-14 DIAGNOSIS — J9601 Acute respiratory failure with hypoxia: Secondary | ICD-10-CM | POA: Diagnosis not present

## 2023-09-14 DIAGNOSIS — J40 Bronchitis, not specified as acute or chronic: Secondary | ICD-10-CM | POA: Diagnosis not present

## 2023-09-14 DIAGNOSIS — J398 Other specified diseases of upper respiratory tract: Secondary | ICD-10-CM | POA: Diagnosis not present

## 2023-09-14 LAB — GLUCOSE, CAPILLARY
Glucose-Capillary: 198 mg/dL — ABNORMAL HIGH (ref 70–99)
Glucose-Capillary: 216 mg/dL — ABNORMAL HIGH (ref 70–99)
Glucose-Capillary: 369 mg/dL — ABNORMAL HIGH (ref 70–99)
Glucose-Capillary: 394 mg/dL — ABNORMAL HIGH (ref 70–99)

## 2023-09-14 LAB — CBC
HCT: 35.9 % — ABNORMAL LOW (ref 36.0–46.0)
Hemoglobin: 11.5 g/dL — ABNORMAL LOW (ref 12.0–15.0)
MCH: 29.1 pg (ref 26.0–34.0)
MCHC: 32 g/dL (ref 30.0–36.0)
MCV: 90.9 fL (ref 80.0–100.0)
Platelets: 102 10*3/uL — ABNORMAL LOW (ref 150–400)
RBC: 3.95 MIL/uL (ref 3.87–5.11)
RDW: 13.1 % (ref 11.5–15.5)
WBC: 11.7 10*3/uL — ABNORMAL HIGH (ref 4.0–10.5)
nRBC: 0 % (ref 0.0–0.2)

## 2023-09-14 LAB — COMPREHENSIVE METABOLIC PANEL
ALT: 27 U/L (ref 0–44)
AST: 33 U/L (ref 15–41)
Albumin: 3.2 g/dL — ABNORMAL LOW (ref 3.5–5.0)
Alkaline Phosphatase: 45 U/L (ref 38–126)
Anion gap: 9 (ref 5–15)
BUN: 21 mg/dL (ref 8–23)
CO2: 27 mmol/L (ref 22–32)
Calcium: 9 mg/dL (ref 8.9–10.3)
Chloride: 98 mmol/L (ref 98–111)
Creatinine, Ser: 1.26 mg/dL — ABNORMAL HIGH (ref 0.44–1.00)
GFR, Estimated: 44 mL/min — ABNORMAL LOW (ref >60)
Glucose, Bld: 254 mg/dL — ABNORMAL HIGH (ref 70–99)
Potassium: 4 mmol/L (ref 3.5–5.1)
Sodium: 134 mmol/L — ABNORMAL LOW (ref 135–145)
Total Bilirubin: 0.4 mg/dL (ref <1.2)
Total Protein: 5.8 g/dL — ABNORMAL LOW (ref 6.5–8.1)

## 2023-09-14 LAB — MAGNESIUM: Magnesium: 1.7 mg/dL (ref 1.7–2.4)

## 2023-09-14 MED ORDER — LABETALOL HCL 5 MG/ML IV SOLN
10.0000 mg | INTRAVENOUS | Status: DC | PRN
  Administered 2023-09-14 – 2023-09-15 (×2): 10 mg via INTRAVENOUS
  Filled 2023-09-14 (×2): qty 4

## 2023-09-14 MED ORDER — MONTELUKAST SODIUM 10 MG PO TABS
10.0000 mg | ORAL_TABLET | Freq: Every day | ORAL | Status: DC
  Administered 2023-09-14 – 2023-09-16 (×3): 10 mg via ORAL
  Filled 2023-09-14 (×3): qty 1

## 2023-09-14 MED ORDER — INFLUENZA VAC A&B SURF ANT ADJ 0.5 ML IM SUSY
0.5000 mL | PREFILLED_SYRINGE | INTRAMUSCULAR | Status: AC
  Administered 2023-09-16: 0.5 mL via INTRAMUSCULAR
  Filled 2023-09-14: qty 0.5

## 2023-09-14 MED ORDER — PNEUMOCOCCAL 20-VAL CONJ VACC 0.5 ML IM SUSY
0.5000 mL | PREFILLED_SYRINGE | INTRAMUSCULAR | Status: AC
  Administered 2023-09-16: 0.5 mL via INTRAMUSCULAR
  Filled 2023-09-14: qty 0.5

## 2023-09-14 MED ORDER — HYDRALAZINE HCL 20 MG/ML IJ SOLN: 10.0000 mg | INTRAMUSCULAR | Status: DC | PRN

## 2023-09-14 NOTE — Progress Notes (Signed)
Progress Note    [name]   ACZ:[id number]  DOB: 08/02/1946  DOA: 09/12/2023     1 PCP: Alease Medina, MD  Initial CC: SOB  Hospital Course: [name] is a 77 yo female with PMH obesity, dCHF, CAD, CKD, depression/anxiety, DM II, HTN, HLD, IBS, hypothyroidism who presented with shortness of breath.  She was brought via EMS from [facility] when picking up pain prescription refills.  She had endorsed shortness of breath for approximately 2 weeks and worsening edema. She was noted to be hypoxic in the low 80s and was placed on 4 L oxygen then nonrebreather.  Lips were also cyanotic on workup. CT angio chest was obtained which was negative for PE and showed tracheobronchomalacia.  Case was discussed with pulmonology on admission and she was recommended for steroids, bronchodilators, and infectious workup.  Interval History:  Breathing still improved.  Remains on oxygen but overall doing better.  Assessment and Plan:  Tracheobronchomalacia Reactive airway disease Bronchitis Acute hypoxic respiratory failure Respiratory acidosis in the setting of acute hypoxic respiratory failure SIRS -Patient presenting with complaining of shortness of breath and her O2 sat dropped to 80% at outpatient pain clinic as well as found to have soft blood pressure systolic upper 70s and diastolic upper 50s range. -ABG pH 7.2, pCO2 52, pO2 73 - Chest x-ray showed low lung volume with mild bibasilar atelectasis - CTA chest obtained which showed negative for pulmonary embolism however it showed  Bronchitis/reactive airways. 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 -CBC no leukocytosis.  Lactic acid within normal range. -Discussed case with on-call pulmonology Dr. Marchelle Gearing recommended check respiratory panel, Pro-Cal level, check ABG.  Treat patient with a steroid and bronchodilator.   Informed tracheobronchomalacia usually happen in the setting of respiratory insult which needs to to be treated with a steroid and bronchodilator -Continue Rocephin and azithromycin for now.  Will de-escalate to oral regimen at discharge -RVP negative, COVID-negative, strep pneumo urinary antigen negative, procalcitonin negative -Continue steroids and breathing treatments - Continue Singulair   Hypotensive episode - Since presentation to ED patient MAP between 51-61.  In the ED patient received total 2 L of NS and LR bolus.  Blood pressure now improved  -Normal lactic acid. -Etiology of hypotension not completely clear however suspecting it is combination of blood pressure regimen and pain medication side effect. - Holding home blood pressure regimen Toprol-XL 50 mg and Lasix. -Discontinue midodrine.  Pressure stable and patient does not take at home after clarification  Low cortisol - no chronic steroid use; no prior known history of AI - suspect low in setting of acute illness  - unable to perform stim test due to use of steroids currently - will need to have ACTH stim test after weaned from steroids and watching BP response while steroids weaned   Essential hypertension Grade 1 diastolic heart failure preserved EF 55 to 60% - Holding Toprol-XL and Lasix in setting of hypotension on admission   AKI on CKD stage 3a Creatinine 1.7.  Baseline creatinine 1.1-1.3 and baseline GFR in between 41 to 51.  Prerenal acute kidney injury in the setting of hypotension. - s/p IVF  - trend BMP  Insulin-dependent DM type II -Continue SSI and CBG monitoring - Continue Semglee.  Will adjust as necessary   Hyponatremia - Serum sodium 133 and hypochloremia 96.  Unclear etiology of development of hyponatremia at this time  except patient takes Lasix at home which might contributing development of hyponatremia. AI could explain but onset seems more acute  -Stable and improved    Hyperlipidemia -Continue Lipitor   Chronic pain syndrome -Resuming gabapentin and Percocet   Hypothyroidism -Continue levothyroxine 75 mcg daily.   Generalized anxiety disorder -Continue BuSpar 30 mg twice daily. -Database reviewed.  Resume clonazepam as well   Anemia of chronic disease -Continue oral iron daily.    Old records reviewed in assessment of this patient  Antimicrobials: Rocephin 09/12/2023 >> current Azithromycin 09/13/2023 >> current  DVT prophylaxis:  enoxaparin (LOVENOX) injection 40 mg Start: 09/13/23 1000 SCDs Start: 09/13/23 0108   Code Status:   Code Status: Full Code  Mobility Assessment (Last 72 Hours)     Mobility Assessment     Row Name 09/14/23 0800 09/13/23 1930 09/13/23 1600 09/13/23 1440     Does patient have an order for bedrest or is patient medically unstable No - Continue assessment No - Continue assessment -- No - Continue assessment    What is the highest level of mobility based on the progressive mobility assessment? Level 3 (Stands with assist) - Balance while standing  and cannot march in place Level 3 (Stands with assist) - Balance while standing  and cannot march in place -- Level 3 (Stands with assist) - Balance while standing  and cannot march in place    Is the above level different from baseline mobility prior to current illness? Yes - Recommend PT order Yes - Recommend PT order -- Yes - Recommend PT order             Barriers to discharge: none Disposition Plan:  Home Status is: Inpt  Objective: Blood pressure 129/72, pulse 75, temperature 97.8 F (36.6 C), temperature source Oral, resp. rate 20, height 5\' 4"  (1.626 m), weight 120.6 kg, SpO2 97%.  Examination:  Physical Exam Constitutional:      General: She is not in acute distress.    Appearance: Normal appearance. She is obese.  HENT:     Head: Normocephalic and atraumatic.     Mouth/Throat:     Mouth: Mucous membranes are moist.  Eyes:     Extraocular  Movements: Extraocular movements intact.  Cardiovascular:     Rate and Rhythm: Normal rate and regular rhythm.  Pulmonary:     Effort: Pulmonary effort is normal. No respiratory distress.     Breath sounds: Normal breath sounds. No wheezing.  Abdominal:     General: Bowel sounds are normal. There is no distension.     Palpations: Abdomen is soft.     Tenderness: There is no abdominal tenderness.  Musculoskeletal:        General: Swelling (3+ B/L LE edema) present. Normal range of motion.     Cervical back: Normal range of motion and neck supple.  Skin:    General: Skin is warm and dry.  Neurological:     General: No focal deficit present.     Mental Status: She is alert.  Psychiatric:        Mood and Affect: Mood normal.      Consultants:    Procedures:    Data Reviewed: Results for orders placed or performed during the hospital encounter of 09/12/23 (from the past 24 hour(s))  CBG monitoring, ED     Status: Abnormal   Collection Time: 09/13/23 12:22 PM  Result Value Ref Range   Glucose-Capillary 322 (H) 70 - 99 mg/dL  Glucose, capillary  Status: Abnormal   Collection Time: 09/13/23  3:42 PM  Result Value Ref Range   Glucose-Capillary 361 (H) 70 - 99 mg/dL   Comment 1 Notify RN   Glucose, capillary     Status: Abnormal   Collection Time: 09/13/23  9:09 PM  Result Value Ref Range   Glucose-Capillary 314 (H) 70 - 99 mg/dL   Comment 1 Document in Chart   CBC     Status: Abnormal   Collection Time: 09/14/23  2:27 AM  Result Value Ref Range   WBC 11.7 (H) 4.0 - 10.5 K/uL   RBC 3.95 3.87 - 5.11 MIL/uL   Hemoglobin 11.5 (L) 12.0 - 15.0 g/dL   HCT 93.2 (L) 35.5 - 73.2 %   MCV 90.9 80.0 - 100.0 fL   MCH 29.1 26.0 - 34.0 pg   MCHC 32.0 30.0 - 36.0 g/dL   RDW 20.2 54.2 - 70.6 %   Platelets 102 (L) 150 - 400 K/uL   nRBC 0.0 0.0 - 0.2 %  Comprehensive metabolic panel     Status: Abnormal   Collection Time: 09/14/23  2:27 AM  Result Value Ref Range   Sodium 134 (L)  135 - 145 mmol/L   Potassium 4.0 3.5 - 5.1 mmol/L   Chloride 98 98 - 111 mmol/L   CO2 27 22 - 32 mmol/L   Glucose, Bld 254 (H) 70 - 99 mg/dL   BUN 21 8 - 23 mg/dL   Creatinine, Ser 2.37 (H) 0.44 - 1.00 mg/dL   Calcium 9.0 8.9 - 62.8 mg/dL   Total Protein 5.8 (L) 6.5 - 8.1 g/dL   Albumin 3.2 (L) 3.5 - 5.0 g/dL   AST 33 15 - 41 U/L   ALT 27 0 - 44 U/L   Alkaline Phosphatase 45 38 - 126 U/L   Total Bilirubin 0.4 <1.2 mg/dL   GFR, Estimated 44 (L) >60 mL/min   Anion gap 9 5 - 15  Magnesium     Status: None   Collection Time: 09/14/23  2:27 AM  Result Value Ref Range   Magnesium 1.7 1.7 - 2.4 mg/dL  Glucose, capillary     Status: Abnormal   Collection Time: 09/14/23  6:04 AM  Result Value Ref Range   Glucose-Capillary 198 (H) 70 - 99 mg/dL   Comment 1 Document in Chart   Glucose, capillary     Status: Abnormal   Collection Time: 09/14/23 10:58 AM  Result Value Ref Range   Glucose-Capillary 216 (H) 70 - 99 mg/dL   Comment 1 Notify RN    Comment 2 Document in Chart     I have reviewed pertinent nursing notes, vitals, labs, and images as necessary. I have ordered labwork to follow up on as indicated.  I have reviewed the last notes from staff over past 24 hours. I have discussed patient's care plan and test results with nursing staff, CM/SW, and other staff as appropriate.  Time spent: Greater than 50% of the 55 minute visit was spent in counseling/coordination of care for the patient as laid out in the A&P.   LOS: 1 day   Lewie Chamber, MD [facility] 09/14/2023, 12:08 PM

## 2023-09-14 NOTE — Plan of Care (Signed)
  Problem: Education: Goal: Knowledge of disease or condition will improve Outcome: Progressing Goal: Knowledge of the prescribed therapeutic regimen will improve Outcome: Progressing Goal: Individualized Educational Video(s) Outcome: Progressing   Problem: Activity: Goal: Ability to tolerate increased activity will improve Outcome: Progressing Goal: Will verbalize the importance of balancing activity with adequate rest periods Outcome: Progressing   Problem: Respiratory: Goal: Ability to maintain a clear airway will improve Outcome: Progressing Goal: Levels of oxygenation will improve Outcome: Progressing Goal: Ability to maintain adequate ventilation will improve Outcome: Progressing   Problem: Activity: Goal: Ability to tolerate increased activity will improve Outcome: Progressing   Problem: Clinical Measurements: Goal: Ability to maintain a body temperature in the normal range will improve Outcome: Progressing   Problem: Respiratory: Goal: Ability to maintain adequate ventilation will improve Outcome: Progressing Goal: Ability to maintain a clear airway will improve Outcome: Progressing   Problem: Education: Goal: Ability to describe self-care measures that may prevent or decrease complications (Diabetes Survival Skills Education) will improve Outcome: Progressing Goal: Individualized Educational Video(s) Outcome: Progressing   Problem: Coping: Goal: Ability to adjust to condition or change in health will improve Outcome: Progressing   Problem: Fluid Volume: Goal: Ability to maintain a balanced intake and output will improve Outcome: Progressing   Problem: Health Behavior/Discharge Planning: Goal: Ability to identify and utilize available resources and services will improve Outcome: Progressing Goal: Ability to manage health-related needs will improve Outcome: Progressing   Problem: Metabolic: Goal: Ability to maintain appropriate glucose levels will  improve Outcome: Progressing   Problem: Nutritional: Goal: Maintenance of adequate nutrition will improve Outcome: Progressing Goal: Progress toward achieving an optimal weight will improve Outcome: Progressing   Problem: Skin Integrity: Goal: Risk for impaired skin integrity will decrease Outcome: Progressing   Problem: Tissue Perfusion: Goal: Adequacy of tissue perfusion will improve Outcome: Progressing   Problem: Education: Goal: Knowledge of General Education information will improve Description: Including pain rating scale, medication(s)/side effects and non-pharmacologic comfort measures Outcome: Progressing   Problem: Health Behavior/Discharge Planning: Goal: Ability to manage health-related needs will improve Outcome: Progressing   Problem: Clinical Measurements: Goal: Ability to maintain clinical measurements within normal limits will improve Outcome: Progressing Goal: Will remain free from infection Outcome: Progressing Goal: Diagnostic test results will improve Outcome: Progressing Goal: Respiratory complications will improve Outcome: Progressing Goal: Cardiovascular complication will be avoided Outcome: Progressing   Problem: Activity: Goal: Risk for activity intolerance will decrease Outcome: Progressing   Problem: Nutrition: Goal: Adequate nutrition will be maintained Outcome: Progressing   Problem: Coping: Goal: Level of anxiety will decrease Outcome: Progressing   Problem: Elimination: Goal: Will not experience complications related to bowel motility Outcome: Progressing Goal: Will not experience complications related to urinary retention Outcome: Progressing   Problem: Pain Management: Goal: General experience of comfort will improve Outcome: Progressing   Problem: Safety: Goal: Ability to remain free from injury will improve Outcome: Progressing   Problem: Skin Integrity: Goal: Risk for impaired skin integrity will decrease Outcome:  Progressing

## 2023-09-15 DIAGNOSIS — J9601 Acute respiratory failure with hypoxia: Secondary | ICD-10-CM | POA: Diagnosis not present

## 2023-09-15 DIAGNOSIS — R6510 Systemic inflammatory response syndrome (SIRS) of non-infectious origin without acute organ dysfunction: Secondary | ICD-10-CM | POA: Diagnosis not present

## 2023-09-15 DIAGNOSIS — J398 Other specified diseases of upper respiratory tract: Secondary | ICD-10-CM | POA: Diagnosis not present

## 2023-09-15 LAB — GLUCOSE, CAPILLARY
Glucose-Capillary: 286 mg/dL — ABNORMAL HIGH (ref 70–99)
Glucose-Capillary: 250 mg/dL — ABNORMAL HIGH (ref 70–99)
Glucose-Capillary: 342 mg/dL — ABNORMAL HIGH (ref 70–99)
Glucose-Capillary: 336 mg/dL — ABNORMAL HIGH (ref 70–99)
Glucose-Capillary: 402 mg/dL — ABNORMAL HIGH (ref 70–99)

## 2023-09-15 LAB — LEGIONELLA PNEUMOPHILA SEROGP 1 UR AG: L. pneumophila Serogp 1 Ur Ag: NEGATIVE

## 2023-09-15 MED ORDER — INSULIN ASPART 100 UNIT/ML IJ SOLN
18.0000 [IU] | Freq: Three times a day (TID) | INTRAMUSCULAR | Status: DC
  Administered 2023-09-15 – 2023-09-16 (×2): 18 [IU] via SUBCUTANEOUS

## 2023-09-15 MED ORDER — IPRATROPIUM-ALBUTEROL 0.5-2.5 (3) MG/3ML IN SOLN
3.0000 mL | Freq: Two times a day (BID) | RESPIRATORY_TRACT | Status: DC
  Administered 2023-09-16: 3 mL via RESPIRATORY_TRACT
  Filled 2023-09-15: qty 3

## 2023-09-15 MED ORDER — INSULIN GLARGINE-YFGN 100 UNIT/ML ~~LOC~~ SOLN
75.0000 [IU] | Freq: Two times a day (BID) | SUBCUTANEOUS | Status: DC
  Administered 2023-09-15 – 2023-09-16 (×2): 75 [IU] via SUBCUTANEOUS
  Filled 2023-09-15 (×3): qty 0.75

## 2023-09-15 MED ORDER — GLUCERNA SHAKE PO LIQD
237.0000 mL | Freq: Two times a day (BID) | ORAL | Status: DC
  Administered 2023-09-16: 237 mL via ORAL

## 2023-09-15 NOTE — Progress Notes (Signed)
  [name], Whitney, Theatre manager   Progress Notes     Signed   Date of Service: 09/15/2023 12:18 PM   Signed      SATURATION QUALIFICATIONS: (This note is used to comply with regulatory documentation for home oxygen)   Patient Saturations on Room Air at Rest = 87%   Patient Saturations on Room Air while Ambulating = 85%   Patient Saturations on 3 Liters of oxygen while Ambulating = 92%   Please briefly explain why patient needs home oxygen: Patient having shortness of breath and dizziness while ambulating.

## 2023-09-15 NOTE — Plan of Care (Signed)
  Problem: Education: Goal: Knowledge of disease or condition will improve Outcome: Progressing Goal: Knowledge of the prescribed therapeutic regimen will improve Outcome: Progressing Goal: Individualized Educational Video(s) Outcome: Progressing   Problem: Activity: Goal: Ability to tolerate increased activity will improve Outcome: Progressing Goal: Will verbalize the importance of balancing activity with adequate rest periods Outcome: Progressing   Problem: Respiratory: Goal: Ability to maintain a clear airway will improve Outcome: Progressing Goal: Levels of oxygenation will improve Outcome: Progressing Goal: Ability to maintain adequate ventilation will improve Outcome: Progressing   Problem: Activity: Goal: Ability to tolerate increased activity will improve Outcome: Progressing   Problem: Clinical Measurements: Goal: Ability to maintain a body temperature in the normal range will improve Outcome: Progressing   Problem: Respiratory: Goal: Ability to maintain adequate ventilation will improve Outcome: Progressing Goal: Ability to maintain a clear airway will improve Outcome: Progressing   Problem: Education: Goal: Ability to describe self-care measures that may prevent or decrease complications (Diabetes Survival Skills Education) will improve Outcome: Progressing Goal: Individualized Educational Video(s) Outcome: Progressing   Problem: Coping: Goal: Ability to adjust to condition or change in health will improve Outcome: Progressing   Problem: Fluid Volume: Goal: Ability to maintain a balanced intake and output will improve Outcome: Progressing   Problem: Health Behavior/Discharge Planning: Goal: Ability to identify and utilize available resources and services will improve Outcome: Progressing Goal: Ability to manage health-related needs will improve Outcome: Progressing   Problem: Metabolic: Goal: Ability to maintain appropriate glucose levels will  improve Outcome: Progressing   Problem: Nutritional: Goal: Maintenance of adequate nutrition will improve Outcome: Progressing Goal: Progress toward achieving an optimal weight will improve Outcome: Progressing   Problem: Skin Integrity: Goal: Risk for impaired skin integrity will decrease Outcome: Progressing   Problem: Tissue Perfusion: Goal: Adequacy of tissue perfusion will improve Outcome: Progressing   Problem: Education: Goal: Knowledge of General Education information will improve Description: Including pain rating scale, medication(s)/side effects and non-pharmacologic comfort measures Outcome: Progressing   Problem: Health Behavior/Discharge Planning: Goal: Ability to manage health-related needs will improve Outcome: Progressing   Problem: Clinical Measurements: Goal: Ability to maintain clinical measurements within normal limits will improve Outcome: Progressing Goal: Will remain free from infection Outcome: Progressing Goal: Diagnostic test results will improve Outcome: Progressing Goal: Respiratory complications will improve Outcome: Progressing Goal: Cardiovascular complication will be avoided Outcome: Progressing   Problem: Activity: Goal: Risk for activity intolerance will decrease Outcome: Progressing   Problem: Nutrition: Goal: Adequate nutrition will be maintained Outcome: Progressing   Problem: Coping: Goal: Level of anxiety will decrease Outcome: Progressing   Problem: Elimination: Goal: Will not experience complications related to bowel motility Outcome: Progressing Goal: Will not experience complications related to urinary retention Outcome: Progressing   Problem: Pain Management: Goal: General experience of comfort will improve Outcome: Progressing   Problem: Safety: Goal: Ability to remain free from injury will improve Outcome: Progressing   Problem: Skin Integrity: Goal: Risk for impaired skin integrity will decrease Outcome:  Progressing

## 2023-09-15 NOTE — Inpatient Diabetes Management (Addendum)
Inpatient Diabetes Program Recommendations  AACE/ADA: New Consensus Statement on Inpatient Glycemic Control (2015)  Target Ranges:  Prepandial:   less than 140 mg/dL      Peak postprandial:   less than 180 mg/dL (1-2 hours)      Critically ill patients:  140 - 180 mg/dL   Lab Results  Component Value Date   GLUCAP 342 (H) 09/15/2023   HGBA1C 11.9 (H) 09/13/2023    Review of Glycemic Control  Latest Reference Range & Units 09/14/23 17:42 09/14/23 21:00 09/15/23 06:03 09/15/23 08:07 09/15/23 11:09  Glucose-Capillary 70 - 99 mg/dL 161 (H) 096 (H) 045 (H) 250 (H) 342 (H)  (H): Data is abnormally high  Diabetes history: DM2 Outpatient Diabetes medications: Semglee 70 units BID, Novolog 10 units TID, Mounjaro 2.5 weekly, Metformin 1000 mg BID Current orders for Inpatient glycemic control: Semglee 70 units BID, Novolog 0-20 units TID and HS, Novolog 10 units TID, Solumedrol 80 mg QD  Inpatient Diabetes Program Recommendations:    Please consider:  Semglee 75 units BID Novolog 18 units TID with meals if she consumes at least 50%  Addendum@1412 :  Spoke with patient at bedside.  Reviewed current A1C of 11.9% (average BG of 295 mg/dL).  Educated her on what an A1C is and goal A1C.  She does not drink any caloric beverages and she does not eat sweets.  She is current with her PCP.  She used to take [facility] and has been out of [facility] for about 6 months due to supply at the pharmacy.  Asked her if she has talked to her PCP about switching to Ozempic and she has not.  She states she takes Semglee 90 units BID, and Novolog 10 units TID, she does not take Metformin.  Her PCP discontinued Metformin 3 years ago.    Educated on The Plate Method, CHO's, portion control, CBGs at home fasting and mid afternoon, F/U with PCP every 3 months, bring meter to PCP office, long and short term complications of uncontrolled BG, and importance of exercise.  She denies hypoglycemia but is aware of signs and  symptoms and how to treat.    Called the [facility] outpatient pharmacy and they have Mounjaro 2.5 mg which is the starting dose.  Might consider ordering this at DC and she can pick it up.  Order [id number].  Will continue to follow while inpatient.  Thank you, Dulce Sellar, MSN, CDCES Diabetes Coordinator Inpatient Diabetes Program [phone] (team pager from 8a-5p)

## 2023-09-15 NOTE — Progress Notes (Signed)
SATURATION QUALIFICATIONS: (This note is used to comply with regulatory documentation for home oxygen)  Patient Saturations on Room Air at Rest = 87%  Patient Saturations on Room Air while Ambulating = 85%  Patient Saturations on 3 Liters of oxygen while Ambulating = 92%  Please briefly explain why patient needs home oxygen: Patient having shortness of breath and dizziness while ambulating.

## 2023-09-15 NOTE — Progress Notes (Signed)
Progress Note    [name]   UJW:[id number]  DOB: 05-31-1946  DOA: 09/12/2023     2 PCP: Alease Medina, MD  Initial CC: SOB  Hospital Course: Ms.[name] is a 77 yo female with PMH obesity, dCHF, CAD, CKD, depression/anxiety, DM II, HTN, HLD, IBS, hypothyroidism who presented with shortness of breath.  She was brought via EMS from [facility] when picking up pain prescription refills.  She had endorsed shortness of breath for approximately 2 weeks and worsening edema. She was noted to be hypoxic in the low 80s and was placed on 4 L oxygen then nonrebreather.  Lips were also cyanotic on workup. CT angio chest was obtained which was negative for PE and showed tracheobronchomalacia.  Case was discussed with pulmonology on admission and she was recommended for steroids, bronchodilators, and infectious workup.  Interval History:  Breathing still improved.  Remains on oxygen but has been weaned some. Still feels nervous about home.  We talked about nutrition and diet some today. Trial of glucerna shakes to help with cravings.   Assessment and Plan:  Tracheobronchomalacia Reactive airway disease Bronchitis Acute hypoxic respiratory failure Respiratory acidosis in the setting of acute hypoxic respiratory failure SIRS -Patient presenting with complaining of shortness of breath and her O2 sat dropped to 80% at outpatient pain clinic as well as found to have soft blood pressure systolic upper 70s and diastolic upper 50s range. -ABG pH 7.2, pCO2 52, pO2 73 - Chest x-ray showed low lung volume with mild bibasilar atelectasis - CTA chest obtained which showed negative for pulmonary embolism however it showed  Bronchitis/reactive airways. 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 -CBC no leukocytosis.  Lactic acid within normal range. -Discussed case with on-call  pulmonology on admission; Dr. Marchelle Gearing recommended check respiratory panel, Pro-Cal level, check ABG.  Treat patient with a steroid and bronchodilator.  Informed tracheobronchomalacia usually happen in the setting of respiratory insult which needs to to be treated with a steroid and bronchodilator -Continue Rocephin and azithromycin for now.  Will de-escalate to oral regimen at discharge -RVP negative, COVID-negative, strep pneumo urinary antigen negative, procalcitonin negative -Continue steroids and breathing treatments - Continue Singulair   Hypotensive episode - Since presentation to ED patient MAP between 51-61.  In the ED patient received total 2 L of NS and LR bolus.  Blood pressure now improved  -Normal lactic acid. -Etiology of hypotension not completely clear however suspecting it is combination of blood pressure regimen and pain medication side effect. - Holding home blood pressure regimen Toprol-XL 50 mg and Lasix. -Discontinue midodrine.  Pressure stable and patient does not take at home after clarification  Low cortisol - no chronic steroid use; no prior known history of AI - suspect low in setting of acute illness  - unable to perform stim test due to use of steroids currently - will need to have ACTH stim test after weaned from steroids and watching BP response while steroids weaned   Essential hypertension Grade 1 diastolic heart failure preserved EF 55 to 60% - Holding Toprol-XL and Lasix in setting of hypotension on admission   AKI on CKD stage 3a Creatinine 1.7.  Baseline creatinine 1.1-1.3 and baseline GFR in between 41 to 51.  Prerenal acute kidney injury in the setting of hypotension. - s/p IVF  - trend BMP  Insulin-dependent DM type II -Continue SSI and CBG monitoring - Continue  Semglee.  Will adjust as necessary   Hyponatremia - Serum sodium 133 and hypochloremia 96.  Unclear etiology of development of hyponatremia at this time except patient takes Lasix at  home which might contributing development of hyponatremia. AI could explain but onset seems more acute  -Stable and improved   Hyperlipidemia -Continue Lipitor   Chronic pain syndrome -Resuming gabapentin and Percocet   Hypothyroidism -Continue levothyroxine 75 mcg daily.   Generalized anxiety disorder -Continue BuSpar 30 mg twice daily. -Database reviewed.  Resume clonazepam as well   Anemia of chronic disease -Continue oral iron daily.    Old records reviewed in assessment of this patient  Antimicrobials: Rocephin 09/12/2023 >> current Azithromycin 09/13/2023 >> current  DVT prophylaxis:  enoxaparin (LOVENOX) injection 40 mg Start: 09/13/23 1000 SCDs Start: 09/13/23 0108   Code Status:   Code Status: Full Code  Mobility Assessment (Last 72 Hours)     Mobility Assessment     Row Name 09/14/23 1920 09/14/23 1749 09/14/23 1200 09/14/23 0800 09/13/23 1930   Does patient have an order for bedrest or is patient medically unstable No - Continue assessment No - Continue assessment No - Continue assessment No - Continue assessment No - Continue assessment   What is the highest level of mobility based on the progressive mobility assessment? Level 3 (Stands with assist) - Balance while standing  and cannot march in place Level 3 (Stands with assist) - Balance while standing  and cannot march in place Level 3 (Stands with assist) - Balance while standing  and cannot march in place Level 3 (Stands with assist) - Balance while standing  and cannot march in place Level 3 (Stands with assist) - Balance while standing  and cannot march in place   Is the above level different from baseline mobility prior to current illness? Yes - Recommend PT order Yes - Recommend PT order Yes - Recommend PT order Yes - Recommend PT order Yes - Recommend PT order    Row Name 09/13/23 1600 09/13/23 1440         Does patient have an order for bedrest or is patient medically unstable -- No - Continue  assessment      What is the highest level of mobility based on the progressive mobility assessment? -- Level 3 (Stands with assist) - Balance while standing  and cannot march in place      Is the above level different from baseline mobility prior to current illness? -- Yes - Recommend PT order               Barriers to discharge: none Disposition Plan:  Home Status is: Inpt  Objective: Blood pressure (!) 140/69, pulse 93, temperature 97.9 F (36.6 C), temperature source Oral, resp. rate 14, height 5\' 4"  (1.626 m), weight 119.9 kg, SpO2 91%.  Examination:  Physical Exam Constitutional:      General: She is not in acute distress.    Appearance: Normal appearance. She is obese.  HENT:     Head: Normocephalic and atraumatic.     Mouth/Throat:     Mouth: Mucous membranes are moist.  Eyes:     Extraocular Movements: Extraocular movements intact.  Cardiovascular:     Rate and Rhythm: Normal rate and regular rhythm.  Pulmonary:     Effort: Pulmonary effort is normal. No respiratory distress.     Breath sounds: Normal breath sounds. No wheezing.  Abdominal:     General: Bowel sounds are normal. There is no  distension.     Palpations: Abdomen is soft.     Tenderness: There is no abdominal tenderness.  Musculoskeletal:        General: Swelling (3+ B/L LE edema) present. Normal range of motion.     Cervical back: Normal range of motion and neck supple.  Skin:    General: Skin is warm and dry.  Neurological:     General: No focal deficit present.     Mental Status: She is alert.  Psychiatric:        Mood and Affect: Mood normal.      Consultants:    Procedures:    Data Reviewed: Results for orders placed or performed during the hospital encounter of 09/12/23 (from the past 24 hour(s))  Glucose, capillary     Status: Abnormal   Collection Time: 09/14/23  5:42 PM  Result Value Ref Range   Glucose-Capillary 369 (H) 70 - 99 mg/dL   Comment 1 Notify RN    Comment 2  Document in Chart   Glucose, capillary     Status: Abnormal   Collection Time: 09/14/23  9:00 PM  Result Value Ref Range   Glucose-Capillary 394 (H) 70 - 99 mg/dL   Comment 1 Document in Chart   Glucose, capillary     Status: Abnormal   Collection Time: 09/15/23  6:03 AM  Result Value Ref Range   Glucose-Capillary 286 (H) 70 - 99 mg/dL   Comment 1 Document in Chart   Glucose, capillary     Status: Abnormal   Collection Time: 09/15/23  8:07 AM  Result Value Ref Range   Glucose-Capillary 250 (H) 70 - 99 mg/dL    I have reviewed pertinent nursing notes, vitals, labs, and images as necessary. I have ordered labwork to follow up on as indicated.  I have reviewed the last notes from staff over past 24 hours. I have discussed patient's care plan and test results with nursing staff, CM/SW, and other staff as appropriate.  Time spent: Greater than 50% of the 55 minute visit was spent in counseling/coordination of care for the patient as laid out in the A&P.   LOS: 2 days   Lewie Chamber, MD [facility] 09/15/2023, 11:05 AM

## 2023-09-15 NOTE — TOC Initial Note (Signed)
Transition of Care [facility]) - Initial/Assessment Note    Patient Details  Name: [name] MRN: [id number] Date of Birth: 1946-05-31  [facility] CM/SW Contact:    Harriet Masson, RN Phone Number: 09/15/2023, 3:06 PM  Clinical Narrative:                 Spoke to patient regarding transition needs.  Patient states she lives with caregiver and uses a walker.  Patient is agreeable to use in house provider [employer] for home 02 needs. Mitch with [employer] notified.  Address, Phone number and PCP verified. TOC will continue to follow for needs.  Expected Discharge Plan: Home/Self Care Barriers to Discharge: Continued Medical Work up   Patient Goals and CMS Choice Patient states their goals for this hospitalization and ongoing recovery are:: return home CMS Medicare.gov Compare Post Acute Care list provided to:: Patient Choice offered to / list presented to : Patient      Expected Discharge Plan and Services   Discharge Planning Services: CM Consult Post Acute Care Choice: Durable Medical Equipment Living arrangements for the past 2 months: Apartment                 DME Arranged: Oxygen DME Agency: [employer] Date DME Agency Contacted: 09/15/23 Time DME Agency Contacted: 57 Representative spoke with at DME Agency: Mitch            Prior Living Arrangements/Services Living arrangements for the past 2 months: Apartment Lives with:: Other (Comment) (caregiver) Patient language and need for interpreter reviewed:: Yes Do you feel safe going back to the place where you live?: Yes      Need for Family Participation in Patient Care: Yes (Comment) Care giver support system in place?: Yes (comment)   Criminal Activity/Legal Involvement Pertinent to Current Situation/Hospitalization: No - Comment as needed  Activities of Daily Living   ADL Screening (condition at time of admission) Independently performs ADLs?: Yes (appropriate for developmental age) Is the  patient deaf or have difficulty hearing?: No Does the patient have difficulty seeing, even when wearing glasses/contacts?: No Does the patient have difficulty concentrating, remembering, or making decisions?: No  Permission Sought/Granted                  Emotional Assessment Appearance:: Appears stated age Attitude/Demeanor/Rapport: Gracious Affect (typically observed): Accepting Orientation: : Oriented to Self, Oriented to Place, Oriented to  Time, Oriented to Situation Alcohol / Substance Use: Not Applicable Psych Involvement: No (comment)  Admission diagnosis:  Hypoxia [R09.02] Tracheobronchomalacia [J39.8] Acute cystitis without hematuria [N30.00] AKI (acute kidney injury) (HCC) [N17.9] Hypotension, unspecified hypotension type [I95.9] Acute hypoxic respiratory failure (HCC) [J96.01] Patient Active Problem List   Diagnosis Date Noted   Acute hypoxic respiratory failure (HCC) 09/13/2023   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   Bronchitis 09/13/2023   SIRS (systemic inflammatory response syndrome) (HCC) 09/13/2023   Chronic diastolic CHF (congestive heart failure) (HCC) 09/13/2023   CKD (chronic kidney disease) stage 3, GFR 30-59 ml/min (HCC) 09/13/2023   Low serum cortisol level 09/13/2023   Hyponatremia 09/13/2023   Hypotensive episode 12/31/2021   Chest pain 12/28/2021   Functional diarrhea 12/28/2021   Insulin dependent type 2 diabetes mellitus (HCC) 12/28/2021   Elevated d-dimer 12/28/2021   Swelling of lower extremity 12/28/2021   Dysuria 12/28/2021   Weakness 12/28/2021   Chronic anemia 05/11/2021   B12 deficiency 05/11/2021   Fracture 03/29/2021   Status post lumbar spinal fusion 02/22/2021   Anemia, blood loss  Hypothyroidism    AMS (altered mental status) 02/15/2021   AMS (altered mental status) 02/15/2021   Lumbar foraminal stenosis 02/09/2021   UTI (urinary tract infection) 09/12/2020   Acidosis 08/22/2020   Acute renal failure superimposed on stage  3a chronic kidney disease (HCC) 03/06/2020   Hyperkalemia 03/06/2020   Acute metabolic encephalopathy 02/06/2020   Acute respiratory failure with hypoxia (HCC) 02/06/2020   OSA (obstructive sleep apnea) 02/06/2020   CPAP ventilation treatment not tolerated 02/06/2020   Morbid obesity with BMI of 45.0-49.9, adult (HCC) 02/06/2020   Lactic acidosis 02/06/2020   possible Sepsis (HCC) 02/06/2020   Polypharmacy 02/06/2020   CAD (coronary artery disease) 05/05/2018   Preop cardiovascular exam 04/08/2018   Microscopic hematuria 09/17/2016   Cystitis, chronic 09/17/2016   Adrenal cortical nodule (HCC) LEFT, small 09/17/2016   Closed fracture of metatarsal bone 07/22/2016   Plantar fasciitis, left 05/28/2016   Degenerative spondylolisthesis 01/02/2016   Tachycardia 01/02/2016   Type 2 diabetes mellitus with hyperlipidemia (HCC)    Essential hypertension    Cardiomyopathy (HCC) 10/06/2015   Heart palpitations 10/06/2015   SOB (shortness of breath) on exertion 10/06/2015   Hematuria, unspecified 01/25/2015   Incontinence of urine in female 01/25/2015   Non-toxic multinodular goiter 10/11/2014   Diabetes mellitus (HCC) 10/11/2014   HLD (hyperlipidemia) 10/11/2014   Depression 10/11/2014   Osteoporosis 10/11/2014   GERD (gastroesophageal reflux disease) 10/11/2014   Arthritis 10/11/2014   Cystocele 10/11/2014   Chronic pain syndrome 10/11/2014   Amnesia 10/11/2014   Back pain 10/11/2014   GAD (generalized anxiety disorder) 04/10/2013   Major depressive disorder, recurrent episode (HCC) 04/10/2013   Convulsions (HCC) 05/29/2012   PCP:  Alease Medina, MD Pharmacy:   [facility] [id number] [place] - [address] [place] [address] Phone: [phone] Fax: [phone]     Social Determinants of Health (SDOH) Social History: SDOH Screenings   Food Insecurity: No Food Insecurity (09/13/2023)  Housing: Low Risk  (09/13/2023)  Transportation Needs: No  Transportation Needs (09/13/2023)  Utilities: Not At Risk (09/13/2023)  Tobacco Use: Low Risk  (09/13/2023)   SDOH Interventions:     Readmission Risk Interventions     No data to display

## 2023-09-16 DIAGNOSIS — R6510 Systemic inflammatory response syndrome (SIRS) of non-infectious origin without acute organ dysfunction: Secondary | ICD-10-CM | POA: Diagnosis not present

## 2023-09-16 DIAGNOSIS — J398 Other specified diseases of upper respiratory tract: Secondary | ICD-10-CM | POA: Diagnosis not present

## 2023-09-16 DIAGNOSIS — J9601 Acute respiratory failure with hypoxia: Secondary | ICD-10-CM | POA: Diagnosis not present

## 2023-09-16 DIAGNOSIS — J40 Bronchitis, not specified as acute or chronic: Secondary | ICD-10-CM | POA: Diagnosis not present

## 2023-09-16 LAB — GLUCOSE, CAPILLARY: Glucose-Capillary: 224 mg/dL — ABNORMAL HIGH (ref 70–99)

## 2023-09-16 MED ORDER — ASPIRIN 81 MG PO TBEC: 81.0000 mg | DELAYED_RELEASE_TABLET | Freq: Every day | ORAL | Status: AC

## 2023-09-16 MED ORDER — NOVOLOG FLEXPEN 100 UNIT/ML ~~LOC~~ SOPN: 3.0000 [IU] | PEN_INJECTOR | Freq: Three times a day (TID) | SUBCUTANEOUS | 2 refills | Status: DC

## 2023-09-16 MED ORDER — PREDNISONE 20 MG PO TABS: 40.0000 mg | ORAL_TABLET | Freq: Every day | ORAL | 0 refills | Status: AC

## 2023-09-16 MED ORDER — SEMGLEE (YFGN) 100 UNIT/ML ~~LOC~~ SOPN: 70.0000 [IU] | PEN_INJECTOR | Freq: Two times a day (BID) | SUBCUTANEOUS | Status: DC

## 2023-09-16 MED ORDER — MONTELUKAST SODIUM 10 MG PO TABS: 10.0000 mg | ORAL_TABLET | Freq: Every day | ORAL | 3 refills | Status: AC

## 2023-09-16 NOTE — Progress Notes (Signed)
Mobility Specialist Progress Note:   09/16/23 1000  Oxygen Therapy  O2 Device Nasal Cannula  Mobility  Activity Ambulated with assistance in hallway  Level of Assistance Contact guard assist, steadying assist  Assistive Device Front wheel walker  Distance Ambulated (ft) 200 ft  Activity Response Tolerated well  Mobility Referral Yes  $Mobility charge 1 Mobility  Mobility Specialist Start Time (ACUTE ONLY) [phone]  Mobility Specialist Stop Time (ACUTE ONLY) 0958  Mobility Specialist Time Calculation (min) (ACUTE ONLY) 16 min    Pre Mobility: 101 HR,  97% SpO2 During Mobility: 127 HR,  95% SpO2 Post Mobility:  110 HR,  97% SpO2  Pt received in bed, agreeable to mobility. C/o some mild dizziness. VSS throughout. Returned to room w/o fault. Pt left in bed with call bell and all needs met.  [name] Mobility Specialist Please contact via Special educational needs teacher or Rehab office at [phone]

## 2023-09-16 NOTE — Discharge Summary (Signed)
Physician Discharge Summary   [name] ZOX:[id number] DOB: January 26, 1946 DOA: 09/12/2023  PCP: Alease Medina, MD  Admit date: 09/12/2023 Discharge date: 09/16/2023   Admitted From: Home Disposition:  Home Discharging physician: Lewie Chamber, MD Barriers to discharge: none  Recommendations at discharge: Wean O2 as able Encourage diet and lifestyle changes for weight loss Adjust insulin regimen as necessary May need ACTH stim test off steroids if BP still low  Equipment/Devices: Oxygen, 2-3L  Discharge Condition: stable CODE STATUS: Full Diet recommendation:  Diet Orders (From admission, onward)     Start     Ordered   09/16/23 0000  Diet - low sodium heart healthy        09/16/23 0903   09/16/23 0000  Diet Carb Modified        09/16/23 0903   09/13/23 0108  Diet heart healthy/carb modified Room service appropriate? Yes; Fluid consistency: Thin  Diet effective now       Question Answer Comment  Diet-HS Snack? Nothing   Room service appropriate? Yes   Fluid consistency: Thin      09/13/23 0107            Hospital Course: [name] is a 77 yo female with PMH obesity, dCHF, CAD, CKD, depression/anxiety, DM II, HTN, HLD, IBS, hypothyroidism who presented with shortness of breath.  [name] was brought via EMS from [facility] when picking up pain prescription refills.  [name] had endorsed shortness of breath for approximately 2 weeks and worsening edema. [name] was noted to be hypoxic in the low 80s and was placed on 4 L oxygen then nonrebreather.  Lips were also cyanotic on workup. CT angio chest was obtained which was negative for PE and showed tracheobronchomalacia.  Case was discussed with pulmonology on admission and [name] was recommended for steroids, bronchodilators, and infectious workup.  Assessment and Plan:  Tracheobronchomalacia Reactive airway disease Bronchitis Acute hypoxic respiratory failure Respiratory acidosis in the setting of acute hypoxic  respiratory failure SIRS -Patient presenting with complaining of shortness of breath and her O2 sat dropped to 80% at outpatient pain clinic as well as found to have soft blood pressure systolic upper 70s and diastolic upper 50s range. -ABG pH 7.2, pCO2 52, pO2 73 - Chest x-ray showed low lung volume with mild bibasilar atelectasis - CTA chest obtained which showed negative for pulmonary embolism however it showed  Bronchitis/reactive airways. 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 -CBC no leukocytosis.  Lactic acid within normal range. -Discussed case with on-call pulmonology on admission; Dr. Marchelle Gearing recommended check respiratory panel, Pro-Cal level, check ABG.  Treat patient with a steroid and bronchodilator.  Informed tracheobronchomalacia usually happen in the setting of respiratory insult which needs to to be treated with a steroid and bronchodilator -s/p Rocephin and azithromycin (courses completed in hospital).   -RVP negative, COVID-negative, strep pneumo urinary antigen negative, procalcitonin negative -Continue steroid at discharge  - Continue Singulair   Hypotensive episode - Since presentation to ED patient MAP between 51-61.  In the ED patient received total 2 L of NS and LR bolus.  Blood pressure now improved  -Normal lactic acid. -Etiology of hypotension not completely clear however suspecting it is combination of blood pressure regimen and pain medication side effect. - Holding home blood pressure regimen Toprol-XL 50 mg and Lasix. -Discontinue midodrine.  Pressure stable and patient does not take at home after clarification  Low cortisol - no chronic steroid use; no prior known history of AI - suspect low in setting of acute illness  - unable to perform stim test due to use of steroids currently - may need to have ACTH stim test after weaned from steroids  and watching BP response while steroids weaned   Essential hypertension Grade 1 diastolic heart failure preserved EF 55 to 60% - resume home regimen    AKI on CKD stage 3a Creatinine 1.7.  Baseline creatinine 1.1-1.3 and baseline GFR in between 41 to 51.  Prerenal acute kidney injury in the setting of hypotension. - s/p IVF    Insulin-dependent DM type II - resume home regimen - modified her SSI - better diet control also recommended before d/c    Hyponatremia - Serum sodium 133 and hypochloremia 96.  Unclear etiology of development of hyponatremia at this time except patient takes Lasix at home which might contributing development of hyponatremia. AI could explain but onset seems more acute  -Stable and improved   Hyperlipidemia -Continue Lipitor   Chronic pain syndrome -continue gabapentin and Percocet   Hypothyroidism -Continue levothyroxine 75 mcg daily.   Generalized anxiety disorder -Continue BuSpar 30 mg twice daily. -Database reviewed.  Resume clonazepam as well   Anemia of chronic disease -Continue oral iron daily.  The patient's acute and chronic medical conditions were treated accordingly. On day of discharge, patient was felt deemed stable for discharge. Patient/family member advised to call PCP or come back to ER if needed.   Principal Diagnosis: Tracheobronchomalacia  Discharge Diagnoses: Active Hospital Problems   Diagnosis Date Noted   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    Priority: 1.   Acute hypoxic respiratory failure (HCC) 09/13/2023    Priority: 1.   SIRS (systemic inflammatory response syndrome) (HCC) 09/13/2023    Priority: 1.   Bronchitis 09/13/2023    Priority: 2.   Hypotensive episode 12/31/2021    Priority: 2.   Low serum cortisol level 09/13/2023    Priority: 3.   Chronic diastolic CHF (congestive heart failure) (HCC) 09/13/2023   CKD (chronic kidney disease) stage 3, GFR 30-59 ml/min (HCC) 09/13/2023   Hyponatremia 09/13/2023    Insulin dependent type 2 diabetes mellitus (HCC) 12/28/2021   Chronic anemia 05/11/2021   Hypothyroidism    Acute renal failure superimposed on stage 3a chronic kidney disease (HCC) 03/06/2020   Type 2 diabetes mellitus with hyperlipidemia (HCC)    Essential hypertension    GERD (gastroesophageal reflux disease) 10/11/2014   Chronic pain syndrome 10/11/2014   HLD (hyperlipidemia) 10/11/2014   GAD (generalized anxiety disorder) 04/10/2013    Resolved Hospital Problems  No resolved problems to display.     Discharge Instructions     Diet - low sodium heart healthy   Complete by: As directed    Diet Carb Modified   Complete by: As directed    Increase activity slowly   Complete by: As directed       Allergies as of 09/16/2023   No Known Allergies      Medication List     STOP taking these medications    atorvastatin 40 MG tablet Commonly known as: LIPITOR       TAKE these medications    aspirin EC 81 MG tablet Take 1 tablet (81 mg total) by mouth daily. Swallow whole.   Cholecalciferol 25 MCG (1000 UT) tablet Take 1,000 Units by mouth daily.   clonazePAM 0.5 MG tablet Commonly known as: KLONOPIN Take 0.5 mg by mouth  2 (two) times daily.   furosemide 40 MG tablet Commonly known as: LASIX Take 1 tablet (40 mg total) by mouth daily as needed. Please hold for next few days   gabapentin 600 MG tablet Commonly known as: NEURONTIN Take 1,200 mg by mouth 2 (two) times daily.   levothyroxine 75 MCG tablet Commonly known as: SYNTHROID Take 1 tablet (75 mcg total) by mouth daily before breakfast.   metoprolol succinate 50 MG 24 hr tablet Commonly known as: TOPROL-XL Take 50 mg by mouth daily. Take with or immediately following a meal.   montelukast 10 MG tablet Commonly known as: SINGULAIR Take 1 tablet (10 mg total) by mouth daily. Start taking on: September 17, 2023   Mounjaro 2.5 MG/0.5ML Pen Generic drug: tirzepatide Inject 2.5 mg into the skin once a  week.   NovoLOG FlexPen 100 UNIT/ML FlexPen Generic drug: insulin aspart Inject 3-20 Units into the skin 4 (four) times daily -  before meals and at bedtime. CBG 121 - 150: 3 units, CBG 151 - 200: 4 units, CBG 201 - 250: 7 units, CBG 251 - 300: 11 units, CBG 301 - 350: 15 units, CBG 351 - 400: 20 units, CBG > 400 call MD What changed:  how much to take when to take this additional instructions   Oscal 500/200 D-3 500-200 MG-UNIT tablet Generic drug: calcium-vitamin D Take 1 tablet by mouth 2 (two) times daily. What changed: when to take this   oxyCODONE-acetaminophen 5-325 MG tablet Commonly known as: PERCOCET/ROXICET Take 1-2 tablets by mouth every 8 (eight) hours as needed for moderate pain or severe pain.   pantoprazole 40 MG tablet Commonly known as: PROTONIX Take 40 mg by mouth daily as needed (heartburn).   predniSONE 20 MG tablet Commonly known as: DELTASONE Take 2 tablets (40 mg total) by mouth daily with breakfast for 4 days. Start taking on: September 17, 2023   QUEtiapine 100 MG tablet Commonly known as: SEROQUEL Take 100 mg by mouth at bedtime.   rosuvastatin 20 MG tablet Commonly known as: CRESTOR Take 1 tablet by mouth in the morning.   Semglee (yfgn) 100 UNIT/ML Pen Generic drug: insulin glargine-yfgn Inject 70 Units into the skin 2 (two) times daily. What changed: how much to take   Viibryd 40 MG Tabs Generic drug: Vilazodone HCl Take 40 mg by mouth daily.   zolpidem 10 MG tablet Commonly known as: AMBIEN Take 5-10 mg by mouth at bedtime.               Durable Medical Equipment  (From admission, onward)           Start     Ordered   09/15/23 1223  For home use only DME oxygen  Once       Question Answer Comment  Length of Need Lifetime   Mode or (Route) Nasal cannula   Liters per Minute 3   Frequency Continuous (stationary and portable oxygen unit needed)   Oxygen conserving device Yes   Oxygen delivery system Gas      09/15/23  1223            Follow-up Information     [facility] Follow up.   Why: Call to schedule apt to get poratlbe oxygen approved Contact information: [address] [place] [address] [phone]                No Known Allergies  Consultations:   Procedures:   Discharge Exam: BP Marland Kitchen)  141/78 (BP Location: Left Arm)   Pulse (!) 101   Temp 98 F (36.7 C) (Oral)   Resp (!) 27   Ht 5\' 4"  (1.626 m)   Wt 119.5 kg   SpO2 96%   BMI 45.22 kg/m  Physical Exam Constitutional:      General: [name] is not in acute distress.    Appearance: Normal appearance. [name] is obese.  HENT:     Head: Normocephalic and atraumatic.     Mouth/Throat:     Mouth: Mucous membranes are moist.  Eyes:     Extraocular Movements: Extraocular movements intact.  Cardiovascular:     Rate and Rhythm: Normal rate and regular rhythm.  Pulmonary:     Effort: Pulmonary effort is normal. No respiratory distress.     Breath sounds: Normal breath sounds. No wheezing.  Abdominal:     General: Bowel sounds are normal. There is no distension.     Palpations: Abdomen is soft.     Tenderness: There is no abdominal tenderness.  Musculoskeletal:        General: Swelling (3+ B/L LE edema) present. Normal range of motion.     Cervical back: Normal range of motion and neck supple.  Skin:    General: Skin is warm and dry.  Neurological:     General: No focal deficit present.     Mental Status: [name] is alert.  Psychiatric:        Mood and Affect: Mood normal.      The results of significant diagnostics from this hospitalization (including imaging, microbiology, ancillary and laboratory) are listed below for reference.   Microbiology: Recent Results (from the past 240 hour(s))  Blood culture (routine x 2)     Status: None (Preliminary result)   Collection Time: 09/13/23 12:20 AM   Specimen: BLOOD RIGHT HAND  Result Value Ref Range Status   Specimen Description BLOOD RIGHT HAND  Final    Special Requests   Final    BOTTLES DRAWN AEROBIC AND ANAEROBIC Blood Culture adequate volume   Culture   Final    NO GROWTH 3 DAYS Performed at [facility], [address] [place], [place] [address]    Report Status PENDING  Incomplete  Blood culture (routine x 2)     Status: None (Preliminary result)   Collection Time: 09/13/23 12:34 AM   Specimen: BLOOD LEFT HAND  Result Value Ref Range Status   Specimen Description BLOOD LEFT HAND  Final   Special Requests   Final    BOTTLES DRAWN AEROBIC AND ANAEROBIC Blood Culture adequate volume   Culture   Final    NO GROWTH 3 DAYS Performed at [facility], [address], [place], [place] [address]    Report Status PENDING  Incomplete  Respiratory (~20 pathogens) panel by PCR     Status: None   Collection Time: 09/13/23 12:59 AM   Specimen: Nasopharyngeal Swab; Respiratory  Result Value Ref Range Status   Adenovirus NOT DETECTED NOT DETECTED Final   Coronavirus 229E NOT DETECTED NOT DETECTED Final    Comment: (NOTE) The Coronavirus on the Respiratory Panel, DOES NOT test for the novel  Coronavirus (2019 nCoV)    Coronavirus HKU1 NOT DETECTED NOT DETECTED Final   Coronavirus NL63 NOT DETECTED NOT DETECTED Final   Coronavirus OC43 NOT DETECTED NOT DETECTED Final   Metapneumovirus NOT DETECTED NOT DETECTED Final   Rhinovirus / Enterovirus NOT DETECTED NOT DETECTED Final   Influenza A NOT  DETECTED NOT DETECTED Final   Influenza B NOT DETECTED NOT DETECTED Final   Parainfluenza Virus 1 NOT DETECTED NOT DETECTED Final   Parainfluenza Virus 2 NOT DETECTED NOT DETECTED Final   Parainfluenza Virus 3 NOT DETECTED NOT DETECTED Final   Parainfluenza Virus 4 NOT DETECTED NOT DETECTED Final   Respiratory Syncytial Virus NOT DETECTED NOT DETECTED Final   Bordetella pertussis NOT DETECTED NOT DETECTED Final   Bordetella Parapertussis NOT DETECTED NOT DETECTED Final   Chlamydophila pneumoniae NOT DETECTED NOT DETECTED Final    Mycoplasma pneumoniae NOT DETECTED NOT DETECTED Final    Comment: Performed at [facility], [address], [place], [place] [address]  MRSA Next Gen by PCR, Nasal     Status: Abnormal   Collection Time: 09/13/23  4:40 AM   Specimen: Nasal Mucosa  Result Value Ref Range Status   MRSA by PCR Next Gen INVENTORY (A) NOT DETECTED Final    Comment: INVALID, UNABLE TO DETERMINE THE PRESENCE OF TARGET DUE TO SPECIMEN INTEGRITY. RECOLLECTION REQUESTED. Performed at [facility], [address], [place], [place] [address]   SARS Coronavirus 2 by RT PCR (hospital order, performed in [facility] hospital lab) *cepheid single result test* Anterior Nasal Swab     Status: None   Collection Time: 09/13/23  8:11 AM   Specimen: Anterior Nasal Swab  Result Value Ref Range Status   SARS Coronavirus 2 by RT PCR NEGATIVE NEGATIVE Final    Comment: Performed at [facility], [address] [place], [place] [address]     Labs: BNP (last 3 results) Recent Labs    09/12/23 1711  BNP 19.9   Basic Metabolic Panel: Recent Labs  Lab 09/12/23 1711 09/13/23 0036 09/13/23 0123 09/14/23 0227  NA 133* 132* 134* 134*  K 4.4 4.6 4.0 4.0  CL 96* 100  --  98  CO2 25 23  --  27  GLUCOSE 195* 172*  --  254*  BUN 26* 25*  --  21  CREATININE 1.70* 1.61*  --  1.26*  CALCIUM 9.3 8.3*  --  9.0  MG  --   --   --  1.7   Liver Function Tests: Recent Labs  Lab 09/12/23 1711 09/13/23 0036 09/14/23 0227  AST 37 32 33  ALT 28 21 27   ALKPHOS 64 51 45  BILITOT 0.7 0.6 0.4  PROT 7.0 5.5* 5.8*  ALBUMIN 3.5 2.9* 3.2*   No results for input(s): "LIPASE", "AMYLASE" in the last 168 hours. No results for input(s): "AMMONIA" in the last 168 hours. CBC: Recent Labs  Lab 09/12/23 1711 09/13/23 0036 09/13/23 0123 09/14/23 0227  WBC 8.3 8.3  --  11.7*  NEUTROABS 4.6  --   --   --   HGB 13.6 11.9* 11.6* 11.5*  HCT 42.7 38.8 34.0* 35.9*  MCV 91.0 93.9  --  90.9  PLT 142* 104*  --  102*    Cardiac Enzymes: No results for input(s): "CKTOTAL", "CKMB", "CKMBINDEX", "TROPONINI" in the last 168 hours. BNP: Invalid input(s): "POCBNP" CBG: Recent Labs  Lab 09/15/23 0807 09/15/23 1109 09/15/23 1556 09/15/23 2120 09/16/23 0615  GLUCAP 250* 342* 336* 402* 224*   D-Dimer No results for input(s): "DDIMER" in the last 72 hours. Hgb A1c No results for input(s): "HGBA1C" in the last 72 hours. Lipid Profile No results for input(s): "CHOL", "HDL", "LDLCALC", "TRIG", "CHOLHDL", "LDLDIRECT" in the last 72 hours. Thyroid function studies No results for input(s): "TSH", "  T4TOTAL", "T3FREE", "THYROIDAB" in the last 72 hours.  Invalid input(s): "FREET3" Anemia work up No results for input(s): "VITAMINB12", "FOLATE", "FERRITIN", "TIBC", "IRON", "RETICCTPCT" in the last 72 hours. Urinalysis    Component Value Date/Time   COLORURINE YELLOW 09/12/2023 2249   APPEARANCEUR HAZY (A) 09/12/2023 2249   APPEARANCEUR Cloudy (A) 08/27/2021 1432   LABSPEC 1.021 09/12/2023 2249   LABSPEC 1.024 01/30/2014 0026   PHURINE 5.0 09/12/2023 2249   GLUCOSEU NEGATIVE 09/12/2023 2249   GLUCOSEU >=500 01/30/2014 0026   HGBUR NEGATIVE 09/12/2023 2249   BILIRUBINUR NEGATIVE 09/12/2023 2249   BILIRUBINUR Negative 08/27/2021 1432   BILIRUBINUR Negative 01/30/2014 0026   KETONESUR NEGATIVE 09/12/2023 2249   PROTEINUR NEGATIVE 09/12/2023 2249   UROBILINOGEN 1.0 07/11/2008 1245   NITRITE NEGATIVE 09/12/2023 2249   LEUKOCYTESUR TRACE (A) 09/12/2023 2249   LEUKOCYTESUR Trace 01/30/2014 0026   Sepsis Labs Recent Labs  Lab 09/12/23 1711 09/13/23 0036 09/14/23 0227  WBC 8.3 8.3 11.7*   Microbiology Recent Results (from the past 240 hour(s))  Blood culture (routine x 2)     Status: None (Preliminary result)   Collection Time: 09/13/23 12:20 AM   Specimen: BLOOD RIGHT HAND  Result Value Ref Range Status   Specimen Description BLOOD RIGHT HAND  Final   Special Requests   Final    BOTTLES DRAWN  AEROBIC AND ANAEROBIC Blood Culture adequate volume   Culture   Final    NO GROWTH 3 DAYS Performed at [facility], [address] [place], [place] [address]    Report Status PENDING  Incomplete  Blood culture (routine x 2)     Status: None (Preliminary result)   Collection Time: 09/13/23 12:34 AM   Specimen: BLOOD LEFT HAND  Result Value Ref Range Status   Specimen Description BLOOD LEFT HAND  Final   Special Requests   Final    BOTTLES DRAWN AEROBIC AND ANAEROBIC Blood Culture adequate volume   Culture   Final    NO GROWTH 3 DAYS Performed at [facility], [address] [place], [place] [address]    Report Status PENDING  Incomplete  Respiratory (~20 pathogens) panel by PCR     Status: None   Collection Time: 09/13/23 12:59 AM   Specimen: Nasopharyngeal Swab; Respiratory  Result Value Ref Range Status   Adenovirus NOT DETECTED NOT DETECTED Final   Coronavirus 229E NOT DETECTED NOT DETECTED Final    Comment: (NOTE) The Coronavirus on the Respiratory Panel, DOES NOT test for the novel  Coronavirus (2019 nCoV)    Coronavirus HKU1 NOT DETECTED NOT DETECTED Final   Coronavirus NL63 NOT DETECTED NOT DETECTED Final   Coronavirus OC43 NOT DETECTED NOT DETECTED Final   Metapneumovirus NOT DETECTED NOT DETECTED Final   Rhinovirus / Enterovirus NOT DETECTED NOT DETECTED Final   Influenza A NOT DETECTED NOT DETECTED Final   Influenza B NOT DETECTED NOT DETECTED Final   Parainfluenza Virus 1 NOT DETECTED NOT DETECTED Final   Parainfluenza Virus 2 NOT DETECTED NOT DETECTED Final   Parainfluenza Virus 3 NOT DETECTED NOT DETECTED Final   Parainfluenza Virus 4 NOT DETECTED NOT DETECTED Final   Respiratory Syncytial Virus NOT DETECTED NOT DETECTED Final   Bordetella pertussis NOT DETECTED NOT DETECTED Final   Bordetella Parapertussis NOT DETECTED NOT DETECTED Final   Chlamydophila pneumoniae NOT DETECTED NOT DETECTED Final   Mycoplasma pneumoniae NOT DETECTED NOT DETECTED  Final    Comment: Performed at [facility], [address] [place], [place] [address]  MRSA Next Gen by PCR, Nasal     Status: Abnormal   Collection Time: 09/13/23  4:40 AM   Specimen: Nasal Mucosa  Result Value Ref Range Status   MRSA by PCR Next Gen INVENTORY (A) NOT DETECTED Final    Comment: INVALID, UNABLE TO DETERMINE THE PRESENCE OF TARGET DUE TO SPECIMEN INTEGRITY. RECOLLECTION REQUESTED. Performed at [facility], [address] [place], [place] [address]   SARS Coronavirus 2 by RT PCR (hospital order, performed in [facility] hospital lab) *cepheid single result test* Anterior Nasal Swab     Status: None   Collection Time: 09/13/23  8:11 AM   Specimen: Anterior Nasal Swab  Result Value Ref Range Status   SARS Coronavirus 2 by RT PCR NEGATIVE NEGATIVE Final    Comment: Performed at [facility], [address], [place], [place] [address]    Procedures/Studies: CT Angio Chest PE W/Cm &/Or Wo Cm  Result Date: 09/13/2023 CLINICAL DATA:  Shortness of breath. Positive D-dimer. PE suspected EXAM: CT ANGIOGRAPHY CHEST WITH CONTRAST TECHNIQUE: Multidetector CT imaging of the chest was performed using the standard protocol during bolus administration of intravenous contrast. Multiplanar CT image reconstructions and MIPs were obtained to evaluate the vascular anatomy. RADIATION DOSE REDUCTION: This exam was performed according to the departmental dose-optimization program which includes automated exposure control, adjustment of the mA and/or kV according to patient size and/or use of iterative reconstruction technique. CONTRAST:  60mL OMNIPAQUE IOHEXOL 350 MG/ML SOLN COMPARISON:  Chest radiograph 09/12/2023 CT 03/29/2021 FINDINGS: Cardiovascular: No pericardial effusion. Aortic and coronary artery atherosclerotic calcification. Negative for acute pulmonary embolism. Mediastinum/Nodes: Esophagus is unremarkable. Bowing of the posterior tracheal compatible with expiration. The  degree of bowing is suggestive of tracheomalacia. There is additional bowing of the posterior wall of the right mainstem bronchus. Calcified left hilar lymph nodes. Lungs/Pleura: Bronchial wall thickening and mucous plugging greatest in the lower lobes. Mosaic attenuation compatible with air trapping. Bibasilar atelectasis/scarring. No pleural effusion pneumothorax. Upper Abdomen: Hepatic steatosis.  No acute abnormality. Musculoskeletal: No acute fracture. Review of the MIP images confirms the above findings. IMPRESSION: 1. Negative for acute pulmonary embolism. 2. Bronchitis/reactive airways. 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 Consider pulmonology referral. 3. Hepatic steatosis. Aortic Atherosclerosis (ICD10-I70.0). Electronically Signed   By: Minerva Fester M.D.   On: 09/13/2023 00:03   DG Chest Port 1 View  Result Date: 09/12/2023 CLINICAL DATA:  Shortness of breath x2 weeks. EXAM: PORTABLE CHEST 1 VIEW COMPARISON:  December 28, 2021 FINDINGS: The heart size and mediastinal contours are within normal limits. Low lung volumes are noted. Mild atelectasis is seen within the bilateral lung bases. There is no evidence of an acute infiltrate, pleural effusion or pneumothorax. The visualized skeletal structures are unremarkable. IMPRESSION: Low lung volumes with mild bibasilar atelectasis. Electronically Signed   By: Aram Candela M.D.   On: 09/12/2023 19:57     Time coordinating discharge: Over 30 minutes    Lewie Chamber, MD  [facility] 09/16/2023, 2:39 PM

## 2023-09-16 NOTE — Care Management Important Message (Signed)
Important Message  Patient Details  Name: [name] MRN: [id number] Date of Birth: [birth year]   Important Message Given:  Yes - Medicare IM  Patient discharged copy of IM mailed to patient home address.    [name] 09/16/2023, 2:13 PM

## 2023-09-18 LAB — CULTURE, BLOOD (ROUTINE X 2)
Culture: NO GROWTH
Culture: NO GROWTH
Special Requests: ADEQUATE
Special Requests: ADEQUATE

## 2023-11-12 ENCOUNTER — Ambulatory Visit (INDEPENDENT_AMBULATORY_CARE_PROVIDER_SITE_OTHER): Payer: Medicare Other | Admitting: Family Medicine

## 2023-11-12 ENCOUNTER — Encounter: Payer: Self-pay | Admitting: Family Medicine

## 2023-11-12 VITALS — BP 112/68 | HR 94 | Temp 98.7°F | Resp 18 | Ht 64.0 in

## 2023-11-12 DIAGNOSIS — E039 Hypothyroidism, unspecified: Secondary | ICD-10-CM

## 2023-11-12 DIAGNOSIS — B3731 Acute candidiasis of vulva and vagina: Secondary | ICD-10-CM

## 2023-11-12 DIAGNOSIS — E785 Hyperlipidemia, unspecified: Secondary | ICD-10-CM

## 2023-11-12 DIAGNOSIS — I10 Essential (primary) hypertension: Secondary | ICD-10-CM

## 2023-11-12 DIAGNOSIS — E875 Hyperkalemia: Secondary | ICD-10-CM

## 2023-11-12 DIAGNOSIS — J9601 Acute respiratory failure with hypoxia: Secondary | ICD-10-CM

## 2023-11-12 DIAGNOSIS — E538 Deficiency of other specified B group vitamins: Secondary | ICD-10-CM | POA: Diagnosis not present

## 2023-11-12 DIAGNOSIS — D500 Iron deficiency anemia secondary to blood loss (chronic): Secondary | ICD-10-CM

## 2023-11-12 DIAGNOSIS — E1169 Type 2 diabetes mellitus with other specified complication: Secondary | ICD-10-CM | POA: Diagnosis not present

## 2023-11-12 DIAGNOSIS — N3000 Acute cystitis without hematuria: Secondary | ICD-10-CM

## 2023-11-12 DIAGNOSIS — R300 Dysuria: Secondary | ICD-10-CM

## 2023-11-12 DIAGNOSIS — R0902 Hypoxemia: Secondary | ICD-10-CM

## 2023-11-12 LAB — GLUCOSE, POCT (MANUAL RESULT ENTRY): POC Glucose: 186 mg/dL — AB (ref 70–99)

## 2023-11-12 MED ORDER — OZEMPIC (0.25 OR 0.5 MG/DOSE) 2 MG/3ML ~~LOC~~ SOPN: 0.5000 mg | PEN_INJECTOR | SUBCUTANEOUS | 1 refills | Status: DC

## 2023-11-12 MED ORDER — LANCET DEVICE MISC: 1.0000 | Freq: Three times a day (TID) | 0 refills | Status: DC

## 2023-11-12 MED ORDER — CYANOCOBALAMIN 1000 MCG/ML IJ SOLN
1000.0000 ug | Freq: Once | INTRAMUSCULAR | Status: AC
  Administered 2023-11-12: 1000 ug via INTRAMUSCULAR

## 2023-11-12 MED ORDER — FLUCONAZOLE 150 MG PO TABS: 150.0000 mg | ORAL_TABLET | ORAL | 0 refills | Status: AC

## 2023-11-12 MED ORDER — LANCETS MISC. MISC: 1.0000 | Freq: Three times a day (TID) | 0 refills | Status: AC

## 2023-11-12 MED ORDER — BLOOD GLUCOSE TEST VI STRP: 1.0000 | ORAL_STRIP | Freq: Three times a day (TID) | 0 refills | Status: DC

## 2023-11-12 MED ORDER — BLOOD GLUCOSE MONITORING SUPPL DEVI: 1.0000 | Freq: Three times a day (TID) | 0 refills | Status: DC

## 2023-11-12 NOTE — Assessment & Plan Note (Signed)
 Hospitalized for 4 days 09/12/2023 for hypoxia and hypotension.  Cardiac echo shows LVEF greater than 55% and grade 1 diastolic dysfunction.  No significant valvular disease.  Was discharged on oxygen but is not using this now.  Encouraged her to use her oxygen especially when she is up moving around.  Has an appointment with pulmonology and cardiology.  Reports she had a stress test results not currently available.

## 2023-11-12 NOTE — Assessment & Plan Note (Signed)
 She is not running a fever and does not endorse back pain.  She has dysuria and frequency.  Urine sent for culture.  Empiric treatment with Macrobid.  She has no recorded allergies.

## 2023-11-12 NOTE — Assessment & Plan Note (Addendum)
 She was admitted to the hospital 09/12/2023 with hypoxia and hypotension.  Was evaluated by cardiology 1-25 echo same result is on 10/04/2023 grade 1 diastolic dysfunction, mitral valve thickened but no stenosis or regurgitation LVEF greater than 55%.  She was in the ICU for a day and then 3 more days on the floor.  Her blood pressure was treated with IV fluids.  She was sent home with oxygen but she does not use this.  She admits that she gets really short of breath just walking through her house with her rollator.  Cannot do a 6-minute walk today because we have no oxygen on the premises.

## 2023-11-12 NOTE — Assessment & Plan Note (Signed)
 On metoprolol succinate 50 mg only

## 2023-11-12 NOTE — Assessment & Plan Note (Signed)
 Checking her CBC and differential

## 2023-11-12 NOTE — Progress Notes (Signed)
 Established Patient Office Visit  Subjective   Patient ID: [name], female    DOB: [birth year]  Age: 78 y.o. MRN: [id number]  Chief Complaint  Patient presents with   Medical Management of Chronic Issues    HPI Evanne is doing pretty well.  She went to her pain management doctor and was found to be hypoxic and hypotensive she was taken to the emergency room where she was admitted in the ICU for 1 day and on the floor for 3 more days.  She was treated with IV fluids.  She had an echo 11-06-23 that showed the same results as when she had done on 10/04/2023: Grade 1 diastolic dysfunction, mitral valve mildly thickened, LVEF greater than 55%.  She notes some shortness of breath walking around with a rollator.  She has an appointment with pulmonologist later this week.  She was placed on oxygen and discharged on oxygen.  She has not been using her oxygen.   She reports she is on a diet to help control her diabetes.  She is on Ozempic  0.5 and has lost 8 pounds.  She notes she does not want to eat as much when she is on Ozempic .  She is on Lantus  90 units daily and NovoLog  10 units twice a day and Farxiga 10 mg daily.  Random blood sugar in the office is 186. She got a flu vaccine and an RSV while she was hospitalized. She is due to have an eye lid lift and sees the cardiologist later this week. She goes to pain management and is on oxycodone  10-3 25 for a day.  She has bilateral knee replacements and her left knee hurts.  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 She thinks she has a yeast infection and a UTI.  No fever, nausea or abdominal pain.    ROS    Objective:     BP 112/68 (BP Location: Right Arm, Patient Position: Sitting, Cuff Size: Normal)   Pulse 94   Temp 98.7 F (37.1 C) (Oral)   Resp 18   Ht 5' 4 (1.626 m)   SpO2 94%   BMI 45.22 kg/m    Physical Exam Vitals and nursing note reviewed.  Constitutional:      Appearance: Normal appearance.  HENT:      Head: Normocephalic and atraumatic.  Eyes:     Conjunctiva/sclera: Conjunctivae normal.  Cardiovascular:     Rate and Rhythm: Normal rate and regular rhythm.  Pulmonary:     Effort: Pulmonary effort is normal.     Breath sounds: Normal breath sounds.  Musculoskeletal:     Right lower leg: No edema.     Left lower leg: No edema.  Skin:    General: Skin is warm and dry.  Neurological:     Mental Status: She is alert and oriented to person, place, and time.  Psychiatric:        Mood and Affect: Mood normal.        Behavior: Behavior normal.        Thought Content: Thought content normal.        Judgment: Judgment normal.          Results for orders placed or performed in visit on 11/12/23  POCT Glucose (CBG)  Result Value Ref Range   POC Glucose 186 (A) 70 - 99 mg/dl      The ASCVD Risk score (Arnett DK, et al., 2019) failed to calculate for the following reasons:  Cannot find a previous HDL lab   Cannot find a previous total cholesterol lab    Assessment & Plan:  Type 2 diabetes mellitus with hyperlipidemia (HCC) Assessment & Plan: She is on Lantus  90 units daily and NovoLog  10 units twice a day and Farxiga 10 mg daily.  Random blood sugar 186.  Last check in November her A1c was 12%  Orders: -     Microalbumin / creatinine urine ratio; Future -     CBC with Differential/Platelet; Future -     Lipid panel; Future -     TSH; Future -     Comprehensive metabolic panel; Future -     T4; Future -     POCT glucose (manual entry)  Essential hypertension Assessment & Plan: On metoprolol  succinate 50 mg only  Orders: -     CBC with Differential/Platelet; Future -     Lipid panel; Future -     TSH; Future -     Comprehensive metabolic panel; Future -     T4; Future  Hypothyroidism, unspecified type Assessment & Plan: Checking TSH and free T4  Orders: -     CBC with Differential/Platelet; Future -     Lipid panel; Future -     TSH; Future -      Comprehensive metabolic panel; Future -     T4; Future  Hyperkalemia -     CBC with Differential/Platelet; Future -     Lipid panel; Future -     TSH; Future -     Comprehensive metabolic panel; Future -     T4; Future  Anemia, blood loss Assessment & Plan: Checking her CBC and differential  Orders: -     CBC with Differential/Platelet; Future -     Lipid panel; Future -     TSH; Future -     Comprehensive metabolic panel; Future -     T4; Future  B12 deficiency Assessment & Plan: B12 injection today.  Orders: -     CBC with Differential/Platelet; Future -     Lipid panel; Future -     TSH; Future -     Comprehensive metabolic panel; Future -     T4; Future -     Cyanocobalamin   Yeast infection involving the vagina and surrounding area Assessment & Plan: Diflucan  150.   One every 3 days x 2.   Acute cystitis without hematuria Assessment & Plan: She is not running a fever and does not endorse back pain.  She has dysuria and frequency.  Urine sent for culture.  Empiric treatment with Macrobid .  She has no recorded allergies.   Hypoxia Assessment & Plan: She was admitted to the hospital 09/12/2023 with hypoxia and hypotension.  Was evaluated by cardiology 1-25 echo same result is on 10/04/2023 grade 1 diastolic dysfunction, mitral valve thickened but no stenosis or regurgitation LVEF greater than 55%.  She was in the ICU for a day and then 3 more days on the floor.  Her blood pressure was treated with IV fluids.  She was sent home with oxygen but she does not use this.  She admits that she gets really short of breath just walking through her house with her rollator.  Cannot do a 6-minute walk today because we have no oxygen on the premises.   Dysuria -     Urine Culture; Future  Acute hypoxic respiratory failure [facility]) Assessment & Plan: Hospitalized for 4 days 09/12/2023 for hypoxia and hypotension.  Cardiac echo shows LVEF greater than 55% and grade 1 diastolic  dysfunction.  No significant valvular disease.  Was discharged on oxygen but is not using this now.  Encouraged her to use her oxygen especially when she is up moving around.  Has an appointment with pulmonology and cardiology.  Reports she had a stress test results not currently available.   Other orders -     Blood Glucose Monitoring Suppl; 1 each by Does not apply route in the morning, at noon, and at bedtime. May substitute to any manufacturer covered by patient's insurance.  Dispense: 1 each; Refill: 0 -     Blood Glucose Test; 1 each by In Vitro route in the morning, at noon, and at bedtime. May substitute to any manufacturer covered by patient's insurance.  Dispense: 100 strip; Refill: 0 -     Lancet Device; 1 each by Does not apply route in the morning, at noon, and at bedtime. May substitute to any manufacturer covered by patient's insurance.  Dispense: 1 each; Refill: 0 -     Lancets Misc.; 1 each by Does not apply route in the morning, at noon, and at bedtime. May substitute to any manufacturer covered by patient's insurance.  Dispense: 100 each; Refill: 0 -     Ozempic  (0.25 or 0.5 MG/DOSE); Inject 0.5 mg into the skin once a week.  Dispense: 3 mL; Refill: 1     Return in about 4 weeks (around 12/10/2023) for check oxygen, BP recheck.    Makelle Marrone K Marylen Zuk, MD

## 2023-11-12 NOTE — Assessment & Plan Note (Signed)
 B12 injection today

## 2023-11-12 NOTE — Assessment & Plan Note (Addendum)
 She is on Lantus 90 units daily and NovoLog 10 units twice a day and Farxiga 10 mg daily.  Random blood sugar 186.  Last check in November her A1c was 12%

## 2023-11-12 NOTE — Assessment & Plan Note (Signed)
Checking TSH and free T4.  

## 2023-11-12 NOTE — Assessment & Plan Note (Signed)
 Diflucan 150.   One every 3 days x 2.

## 2023-11-13 ENCOUNTER — Telehealth: Payer: Self-pay | Admitting: Family Medicine

## 2023-11-13 LAB — MICROALBUMIN / CREATININE URINE RATIO
Creatinine, Urine: 119.2 mg/dL
Microalb/Creat Ratio: 48 mg/g{creat} — ABNORMAL HIGH (ref 0–29)
Microalbumin, Urine: 57.1 ug/mL

## 2023-11-13 NOTE — Telephone Encounter (Signed)
 Pt req antibiotic Rx to be sent in to [facility] on [address] [facility].

## 2023-11-14 ENCOUNTER — Other Ambulatory Visit: Payer: Self-pay | Admitting: Family Medicine

## 2023-11-14 LAB — URINE CULTURE

## 2023-11-14 MED ORDER — NITROFURANTOIN MONOHYD MACRO 100 MG PO CAPS: 100.0000 mg | ORAL_CAPSULE | Freq: Two times a day (BID) | ORAL | 0 refills | Status: DC

## 2023-11-18 LAB — CBC WITH DIFFERENTIAL/PLATELET
Basophils Absolute: 0 10*3/uL (ref 0.0–0.2)
Basos: 1 %
EOS (ABSOLUTE): 0.1 10*3/uL (ref 0.0–0.4)
Eos: 2 %
Hematocrit: 42.6 % (ref 34.0–46.6)
Hemoglobin: 13.8 g/dL (ref 11.1–15.9)
Immature Grans (Abs): 0 10*3/uL (ref 0.0–0.1)
Immature Granulocytes: 1 %
Lymphocytes Absolute: 1.9 10*3/uL (ref 0.7–3.1)
Lymphs: 30 %
MCH: 29.9 pg (ref 26.6–33.0)
MCHC: 32.4 g/dL (ref 31.5–35.7)
MCV: 92 fL (ref 79–97)
Monocytes Absolute: 0.6 10*3/uL (ref 0.1–0.9)
Monocytes: 9 %
Neutrophils Absolute: 3.7 10*3/uL (ref 1.4–7.0)
Neutrophils: 57 %
Platelets: 138 10*3/uL — ABNORMAL LOW (ref 150–450)
RBC: 4.61 x10E6/uL (ref 3.77–5.28)
RDW: 14.9 % (ref 11.7–15.4)
WBC: 6.3 10*3/uL (ref 3.4–10.8)

## 2023-11-18 LAB — COMPREHENSIVE METABOLIC PANEL
ALT: 24 [IU]/L (ref 0–32)
AST: 31 [IU]/L (ref 0–40)
Albumin: 4.1 g/dL (ref 3.8–4.8)
Alkaline Phosphatase: 66 [IU]/L (ref 44–121)
BUN/Creatinine Ratio: 12 (ref 12–28)
BUN: 14 mg/dL (ref 8–27)
Bilirubin Total: 0.5 mg/dL (ref 0.0–1.2)
CO2: 20 mmol/L (ref 20–29)
Calcium: 9.2 mg/dL (ref 8.7–10.3)
Chloride: 106 mmol/L (ref 96–106)
Creatinine, Ser: 1.14 mg/dL — ABNORMAL HIGH (ref 0.57–1.00)
Globulin, Total: 2.3 g/dL (ref 1.5–4.5)
Glucose: 186 mg/dL — ABNORMAL HIGH (ref 70–99)
Potassium: 4.5 mmol/L (ref 3.5–5.2)
Sodium: 140 mmol/L (ref 134–144)
Total Protein: 6.4 g/dL (ref 6.0–8.5)
eGFR: 50 mL/min/{1.73_m2} — ABNORMAL LOW (ref >59)

## 2023-11-18 LAB — LIPID PANEL
Chol/HDL Ratio: 3.8 {ratio} (ref 0.0–4.4)
Cholesterol, Total: 133 mg/dL (ref 100–199)
HDL: 35 mg/dL — ABNORMAL LOW (ref >39)
LDL Chol Calc (NIH): 48 mg/dL (ref 0–99)
Triglycerides: 333 mg/dL — ABNORMAL HIGH (ref 0–149)
VLDL Cholesterol Cal: 50 mg/dL — ABNORMAL HIGH (ref 5–40)

## 2023-11-18 LAB — TSH: TSH: 2.13 u[IU]/mL (ref 0.450–4.500)

## 2023-11-18 LAB — T4

## 2023-11-20 ENCOUNTER — Ambulatory Visit: Payer: Medicare Other | Admitting: Family Medicine

## 2023-12-10 ENCOUNTER — Ambulatory Visit (INDEPENDENT_AMBULATORY_CARE_PROVIDER_SITE_OTHER): Payer: Medicare Other | Admitting: Family Medicine

## 2023-12-10 ENCOUNTER — Encounter: Payer: Self-pay | Admitting: Family Medicine

## 2023-12-10 VITALS — BP 115/71 | HR 96 | Temp 97.7°F | Resp 20 | Ht 64.0 in | Wt 251.0 lb

## 2023-12-10 DIAGNOSIS — I5032 Chronic diastolic (congestive) heart failure: Secondary | ICD-10-CM

## 2023-12-10 DIAGNOSIS — L0211 Cutaneous abscess of neck: Secondary | ICD-10-CM

## 2023-12-10 DIAGNOSIS — E1169 Type 2 diabetes mellitus with other specified complication: Secondary | ICD-10-CM | POA: Diagnosis not present

## 2023-12-10 DIAGNOSIS — J398 Other specified diseases of upper respiratory tract: Secondary | ICD-10-CM

## 2023-12-10 DIAGNOSIS — I10 Essential (primary) hypertension: Secondary | ICD-10-CM | POA: Insufficient documentation

## 2023-12-10 DIAGNOSIS — Z794 Long term (current) use of insulin: Secondary | ICD-10-CM

## 2023-12-10 DIAGNOSIS — E119 Type 2 diabetes mellitus without complications: Secondary | ICD-10-CM | POA: Diagnosis not present

## 2023-12-10 DIAGNOSIS — J9601 Acute respiratory failure with hypoxia: Secondary | ICD-10-CM

## 2023-12-10 DIAGNOSIS — Z13820 Encounter for screening for osteoporosis: Secondary | ICD-10-CM

## 2023-12-10 DIAGNOSIS — E538 Deficiency of other specified B group vitamins: Secondary | ICD-10-CM | POA: Diagnosis not present

## 2023-12-10 DIAGNOSIS — E785 Hyperlipidemia, unspecified: Secondary | ICD-10-CM

## 2023-12-10 DIAGNOSIS — Z780 Asymptomatic menopausal state: Secondary | ICD-10-CM

## 2023-12-10 LAB — GLUCOSE, POCT (MANUAL RESULT ENTRY): POC Glucose: 201 mg/dL — AB (ref 70–99)

## 2023-12-10 MED ORDER — BLOOD GLUCOSE MONITORING SUPPL DEVI: 1.0000 | Freq: Three times a day (TID) | 0 refills | Status: DC

## 2023-12-10 MED ORDER — LANCET DEVICE MISC: 1.0000 | Freq: Three times a day (TID) | 0 refills | Status: AC

## 2023-12-10 MED ORDER — INSULIN ASPART 100 UNIT/ML IJ SOLN: INTRAMUSCULAR | 11 refills | Status: DC

## 2023-12-10 MED ORDER — BLOOD GLUCOSE TEST VI STRP: 1.0000 | ORAL_STRIP | Freq: Three times a day (TID) | 0 refills | Status: DC

## 2023-12-10 MED ORDER — OZEMPIC (1 MG/DOSE) 4 MG/3ML ~~LOC~~ SOPN: 1.0000 mg | PEN_INJECTOR | SUBCUTANEOUS | 0 refills | Status: DC

## 2023-12-10 MED ORDER — CYANOCOBALAMIN 1000 MCG/ML IJ SOLN
1000.0000 ug | Freq: Once | INTRAMUSCULAR | Status: AC
  Administered 2023-12-10: 1000 ug via INTRAMUSCULAR

## 2023-12-10 MED ORDER — DOXYCYCLINE HYCLATE 100 MG PO CAPS: 100.0000 mg | ORAL_CAPSULE | Freq: Two times a day (BID) | ORAL | 0 refills | Status: DC

## 2023-12-10 NOTE — Progress Notes (Signed)
 Established Patient Office Visit  Subjective   Patient ID: [name], female    DOB: October 27, 1946  Age: 78 y.o. MRN: [id number]  Chief Complaint  Patient presents with   Establish Care   Medical Management of Chronic Issues    HPI [name]'s daughter [name] overdosed and has been hospitalized and intubated for the last 2 weeks.  She does not respond to simple commands but has to be sedated because she is so agitated.  11/24 [name] was admitted with hypoxia and hypotension, dyspnea and peripheral edema. Cardiac cath showed EF greater than 50% and BNP 19, troponins negative x 3 she was found to be hypoxic to 80% and also had hypotension CT angiogram was negative for pulmonary embolism.  AKI on CKD stage 3a with  creatinine 1.7 and baseline normally 1.1-1.3.  She was discharged on 3 L/min of oxygen but did not receive this at home. 2019 she had a left heart cath had a DES to the proximal LAD.  She has not complained of any chest pain just shortness of breath. Followed up with pulmonary. CT of the chest showed bowing of the posterior wall the right mainstem bronchus and trachea consistent with tracheobronchomalacia.  She also has sleep apnea that is untreated.  She may be a candidate for inspire.  She is supposed to be sleeping and oxygen but has not.  She has a small portable oxygen tank but cannot take it with her because she is using a rollator to balance and walk.   She has an abscess of the posterior right neck.  She took doxycycline  for ten days and the abscess is better but not resolved.  No fever, chills or night sweats.    Random nonfasting blood sugar 201.  She is on Farxiga 10 mg, Lantus  70 units  twice daily, NovoLog  sliding scale before meals and nightly.  She is on low-dose Ozempic  0.5 mg weekly and has lost 20 pounds from 268-248.  She denies any episodes of diaphoresis, dizziness, headache, confusion, nausea.      ROS    Objective:     BP 115/71 (BP Location: Right Arm,  Patient Position: Sitting, Cuff Size: Large)   Pulse 96   Temp 97.7 F (36.5 C) (Oral)   Resp 20   Ht 5' 4 (1.626 m) Comment: 5'4  Wt 251 lb (113.9 kg)   SpO2 94%   BMI 43.08 kg/m    Physical Exam Vitals and nursing note reviewed.  Constitutional:      Appearance: Normal appearance.  HENT:     Head: Normocephalic and atraumatic.  Eyes:     Conjunctiva/sclera: Conjunctivae normal.  Cardiovascular:     Rate and Rhythm: Normal rate and regular rhythm.  Pulmonary:     Effort: Pulmonary effort is normal.     Breath sounds: Normal breath sounds.  Musculoskeletal:     Right lower leg: No edema.     Left lower leg: No edema.  Skin:    General: Skin is warm and dry.  Neurological:     Mental Status: She is alert and oriented to person, place, and time.  Psychiatric:        Mood and Affect: Mood normal.        Behavior: Behavior normal.        Thought Content: Thought content normal.        Judgment: Judgment normal.          Results for orders placed or performed in visit  on 12/10/23  POCT Glucose (CBG)  Result Value Ref Range   POC Glucose 201 (A) 70 - 99 mg/dl      The 89-bzjm ASCVD risk score (Arnett DK, et al., 2019) is: 32.5%    Assessment & Plan:  Type 2 diabetes mellitus with hyperlipidemia (HCC) -     HM Diabetes Foot Exam -     Ambulatory referral to Ophthalmology -     POCT glucose (manual entry) -     Blood Glucose Monitoring Suppl; 1 each by Does not apply route in the morning, at noon, and at bedtime. May substitute to any manufacturer covered by patient's insurance.  Dispense: 1 each; Refill: 0 -     Blood Glucose Test; 1 each by In Vitro route in the morning, at noon, and at bedtime. May substitute to any manufacturer covered by patient's insurance.  Dispense: 100 strip; Refill: 0 -     Lancet Device; 1 each by Does not apply route in the morning, at noon, and at bedtime. May substitute to any manufacturer covered by patient's insurance.  Dispense:  1 each; Refill: 0 -     Ozempic  (1 MG/DOSE); Inject 1 mg into the skin once a week.  Dispense: 3 mL; Refill: 0 -     Insulin  Aspart; Inject 3-20 units into the skin four times daily.  Before meals and at bedtime.  CBG 121-150: 3 units, CBG 151-200: 4 units, CBG 201-250: 7 units, CBG 251-300: 11 units, CBG 301-350: 15 units, CBG 351-420 units, CBG > 400 call MD  Dispense: 10 mL; Refill: 11  Encounter for osteoporosis screening in asymptomatic postmenopausal patient -     DG Bone Density; Future  Tracheobronchomalacia -     PR PORTABLE OXYGEN CONCENTRATOR  B12 deficiency -     Cyanocobalamin   Abscess of neck -     Doxycycline  Hyclate; Take 1 capsule (100 mg total) by mouth 2 (two) times daily.  Dispense: 14 capsule; Refill: 0  Acute hypoxic respiratory failure (HCC)  Insulin  dependent type 2 diabetes mellitus (HCC) Assessment & Plan: Will get PA for Ozempic .  She has been taking 0.5 mg this month.  Will increase dosage to 1 mg weekly.  As well.  Cannot check A1c because it has not been full 90 days since last checked.        Return in about 4 weeks (around 01/07/2024).    Whitney Takaki [name], MD

## 2023-12-10 NOTE — Assessment & Plan Note (Signed)
 Will get PA for Ozempic .  She has been taking 0.5 mg this month.  Will increase dosage to 1 mg weekly.  As well.  Cannot check A1c because it has not been full 90 days since last checked.

## 2023-12-16 ENCOUNTER — Ambulatory Visit: Payer: Medicare Other | Admitting: Family Medicine

## 2023-12-17 ENCOUNTER — Telehealth: Payer: Self-pay | Admitting: Family Medicine

## 2023-12-17 NOTE — Addendum Note (Signed)
Addended by: Tonny Bollman on: 12/17/2023 11:09 AM   Modules accepted: Orders

## 2023-12-17 NOTE — Telephone Encounter (Signed)
Error

## 2024-01-07 ENCOUNTER — Ambulatory Visit: Payer: Medicare Other | Admitting: Family Medicine

## 2024-01-08 ENCOUNTER — Ambulatory Visit: Admitting: Family Medicine

## 2024-01-08 ENCOUNTER — Encounter: Payer: Self-pay | Admitting: Family Medicine

## 2024-01-08 VITALS — BP 114/69 | HR 82 | Temp 97.9°F | Resp 18 | Ht 64.0 in | Wt 250.0 lb

## 2024-01-08 DIAGNOSIS — E785 Hyperlipidemia, unspecified: Secondary | ICD-10-CM

## 2024-01-08 DIAGNOSIS — Z794 Long term (current) use of insulin: Secondary | ICD-10-CM

## 2024-01-08 DIAGNOSIS — E119 Type 2 diabetes mellitus without complications: Secondary | ICD-10-CM | POA: Diagnosis not present

## 2024-01-08 DIAGNOSIS — I2510 Atherosclerotic heart disease of native coronary artery without angina pectoris: Secondary | ICD-10-CM

## 2024-01-08 DIAGNOSIS — Z9181 History of falling: Secondary | ICD-10-CM

## 2024-01-08 DIAGNOSIS — E1169 Type 2 diabetes mellitus with other specified complication: Secondary | ICD-10-CM

## 2024-01-08 DIAGNOSIS — E538 Deficiency of other specified B group vitamins: Secondary | ICD-10-CM | POA: Diagnosis not present

## 2024-01-08 LAB — GLUCOSE, POCT (MANUAL RESULT ENTRY): POC Glucose: 195 mg/dL — AB (ref 70–99)

## 2024-01-08 MED ORDER — CYANOCOBALAMIN 1000 MCG/ML IJ SOLN
1000.0000 ug | Freq: Once | INTRAMUSCULAR | Status: AC
  Administered 2024-01-08: 1000 ug via INTRAMUSCULAR

## 2024-01-08 MED ORDER — SEMAGLUTIDE (1 MG/DOSE) 4 MG/3ML ~~LOC~~ SOPN: 1.0000 mg | PEN_INJECTOR | SUBCUTANEOUS | 1 refills | Status: DC

## 2024-01-08 MED ORDER — BLOOD GLUCOSE TEST VI STRP: 1.0000 | ORAL_STRIP | Freq: Three times a day (TID) | 0 refills | Status: DC

## 2024-01-08 MED ORDER — LANCET DEVICE MISC: 1.0000 | Freq: Three times a day (TID) | 0 refills | Status: AC

## 2024-01-08 MED ORDER — LANCETS MISC. MISC: 1.0000 | Freq: Three times a day (TID) | 0 refills | Status: AC

## 2024-01-08 MED ORDER — BLOOD GLUCOSE MONITORING SUPPL DEVI: 1.0000 | Freq: Three times a day (TID) | 0 refills | Status: DC

## 2024-01-08 NOTE — Progress Notes (Signed)
 Established Patient Office Visit  Subjective   Patient ID: [name], female    DOB: 31-Aug-1946  Age: 78 y.o. MRN: [id number]  No chief complaint on file.   HPI Her daughter survived her overdose and is out of ICU. [name] is very relieved.   She needs another glucometer.  She wants to increase the Ozempic.  She is getting 1mg  weekly and her FBS are running about 180-250's.  She is on Farxiga 10mg  , Lantus 70 units BID and Novolog sliding scale with meals.  Her daughter is doing the cooking.  Suggested more veggies and less bread, potatoes, rice and pasta.   She saw the pulmonologist, Dr. Meredeth Ide an her PFTs were good.  FVC 79%, FEV1 80%, FEV1/FVC 101%.  FEV 25-75% 90 %.  She has tracheobronchomalacia. The abscess on the back of her neck has healed       ROS    Objective:     BP 114/69 (BP Location: Right Arm, Patient Position: Sitting, Cuff Size: Normal)   Pulse 82   Temp 97.9 F (36.6 C) (Oral)   Resp 18   Ht 5\' 4"  (1.626 m)   Wt 250 lb (113.4 kg)   SpO2 94%   BMI 42.91 kg/m    Physical Exam Vitals and nursing note reviewed.  Constitutional:      Appearance: Normal appearance.  HENT:     Head: Normocephalic and atraumatic.  Eyes:     Conjunctiva/sclera: Conjunctivae normal.  Cardiovascular:     Rate and Rhythm: Normal rate and regular rhythm.  Pulmonary:     Effort: Pulmonary effort is normal.     Breath sounds: Normal breath sounds.  Musculoskeletal:     Right lower leg: No edema.     Left lower leg: No edema.  Skin:    General: Skin is warm and dry.  Neurological:     Mental Status: She is alert and oriented to person, place, and time.  Psychiatric:        Mood and Affect: Mood normal.        Behavior: Behavior normal.        Thought Content: Thought content normal.        Judgment: Judgment normal.          Results for orders placed or performed in visit on 01/08/24  POCT Glucose (CBG)  Result Value Ref Range   POC Glucose 195 (A) 70 - 99  mg/dl      The 34-VQQV ASCVD risk score (Arnett DK, et al., 2019) is: 32.1%    Assessment & Plan:  Type 2 diabetes mellitus with hyperlipidemia (HCC) -     POCT glucose (manual entry)  B12 deficiency Assessment & Plan: B12 injection today.    Orders: -     Cyanocobalamin  Coronary artery disease involving native coronary artery of native heart without angina pectoris  Insulin dependent type 2 diabetes mellitus (HCC) Assessment & Plan: Increasing Ozempic to 2mg  weekly next month.  Will continue 1mg  for an additional month.     At high risk for falls Assessment & Plan: Uses a rollator for all ambulation.  Hers needs to be replaced.  Sending orders.   Other orders -     Blood Glucose Monitoring Suppl; 1 each by Does not apply route in the morning, at noon, and at bedtime. May substitute to any manufacturer covered by patient's insurance.  Dispense: 1 each; Refill: 0 -     Blood Glucose Test; 1  each by In Vitro route in the morning, at noon, and at bedtime. May substitute to any manufacturer covered by patient's insurance.  Dispense: 100 strip; Refill: 0 -     Lancet Device; 1 each by Does not apply route in the morning, at noon, and at bedtime. May substitute to any manufacturer covered by patient's insurance.  Dispense: 1 each; Refill: 0 -     Lancets Misc.; 1 each by Does not apply route in the morning, at noon, and at bedtime. May substitute to any manufacturer covered by patient's insurance.  Dispense: 100 each; Refill: 0     No follow-ups on file.    Alease Medina, MD

## 2024-01-09 DIAGNOSIS — Z9181 History of falling: Secondary | ICD-10-CM | POA: Insufficient documentation

## 2024-01-09 NOTE — Assessment & Plan Note (Signed)
 B12 injection today

## 2024-01-09 NOTE — Assessment & Plan Note (Signed)
 Uses a rollator for all ambulation.  Hers needs to be replaced.  Sending orders.

## 2024-01-09 NOTE — Assessment & Plan Note (Addendum)
 Increasing Ozempic to 2mg  weekly next month.  Will continue 1mg  for an additional month.

## 2024-01-23 ENCOUNTER — Ambulatory Visit: Payer: Self-pay

## 2024-01-23 ENCOUNTER — Ambulatory Visit: Admitting: Family Medicine
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
## 2024-01-26 ENCOUNTER — Ambulatory Visit: Admitting: Family Medicine

## 2024-01-27 ENCOUNTER — Encounter: Payer: Self-pay | Admitting: Family Medicine

## 2024-01-27 ENCOUNTER — Encounter: Payer: Self-pay | Admitting: Oncology

## 2024-01-27 ENCOUNTER — Ambulatory Visit (INDEPENDENT_AMBULATORY_CARE_PROVIDER_SITE_OTHER): Admitting: Family Medicine

## 2024-01-27 VITALS — BP 107/68 | HR 89 | Temp 97.7°F | Resp 18 | Ht 64.0 in | Wt 246.0 lb

## 2024-01-27 DIAGNOSIS — S134XXD Sprain of ligaments of cervical spine, subsequent encounter: Secondary | ICD-10-CM | POA: Diagnosis not present

## 2024-01-27 DIAGNOSIS — M25562 Pain in left knee: Secondary | ICD-10-CM | POA: Insufficient documentation

## 2024-01-27 MED ORDER — TIZANIDINE HCL 4 MG PO CAPS: 4.0000 mg | ORAL_CAPSULE | Freq: Three times a day (TID) | ORAL | 0 refills | Status: DC | PRN
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
## 2024-01-27 NOTE — Assessment & Plan Note (Signed)
 Advised that she needs to see Ortho about this.  Trial of salonpas but she declined.
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
## 2024-02-02 ENCOUNTER — Telehealth: Payer: Self-pay

## 2024-02-02 DIAGNOSIS — E1169 Type 2 diabetes mellitus with other specified complication: Secondary | ICD-10-CM

## 2024-02-02 NOTE — Telephone Encounter (Signed)
 Copied from CRM [id number]. Topic: Clinical - Prescription Issue >> Feb 02, 2024  2:44 PM Geroge Baseman wrote: Reason for CRM: Patient is needing a walker that she is able to sit on to get around, she states she received one and its just one that assists in walking. She is also wanting to know about getting a diabetic supply kit from [facility], [phone].

## 2024-02-09 ENCOUNTER — Ambulatory Visit: Admitting: Family Medicine

## 2024-02-10 ENCOUNTER — Encounter: Payer: Self-pay | Admitting: Family Medicine

## 2024-02-10 ENCOUNTER — Encounter: Payer: Self-pay | Admitting: Oncology

## 2024-02-10 ENCOUNTER — Ambulatory Visit: Admitting: Family Medicine

## 2024-02-10 VITALS — BP 106/71 | HR 76 | Temp 97.7°F | Resp 18 | Ht 64.0 in | Wt 243.0 lb

## 2024-02-10 DIAGNOSIS — E785 Hyperlipidemia, unspecified: Secondary | ICD-10-CM

## 2024-02-10 DIAGNOSIS — E1169 Type 2 diabetes mellitus with other specified complication: Secondary | ICD-10-CM | POA: Diagnosis not present

## 2024-02-10 DIAGNOSIS — E119 Type 2 diabetes mellitus without complications: Secondary | ICD-10-CM

## 2024-02-10 DIAGNOSIS — F321 Major depressive disorder, single episode, moderate: Secondary | ICD-10-CM

## 2024-02-10 DIAGNOSIS — E538 Deficiency of other specified B group vitamins: Secondary | ICD-10-CM

## 2024-02-10 DIAGNOSIS — R1312 Dysphagia, oropharyngeal phase: Secondary | ICD-10-CM

## 2024-02-10 DIAGNOSIS — Z794 Long term (current) use of insulin: Secondary | ICD-10-CM

## 2024-02-10 LAB — POCT GLYCOSYLATED HEMOGLOBIN (HGB A1C): Hemoglobin A1C: 7.2 % — AB (ref 4.0–5.6)

## 2024-02-10 MED ORDER — OZEMPIC (2 MG/DOSE) 8 MG/3ML ~~LOC~~ SOPN
2.0000 mg | PEN_INJECTOR | SUBCUTANEOUS | 11 refills | Status: DC
Start: 2024-02-10 — End: 2024-03-09

## 2024-02-10 MED ORDER — CYANOCOBALAMIN 1000 MCG/ML IJ SOLN
1000.0000 ug | INTRAMUSCULAR | Status: AC
  Administered 2024-02-10 – 2024-10-20 (×6): 1000 ug via INTRAMUSCULAR
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
## 2024-02-11 ENCOUNTER — Other Ambulatory Visit: Payer: Self-pay | Admitting: Family Medicine

## 2024-02-11 DIAGNOSIS — S134XXD Sprain of ligaments of cervical spine, subsequent encounter: Secondary | ICD-10-CM

## 2024-02-11 NOTE — Telephone Encounter (Unsigned)
 Copied from CRM [id number]. Topic: Clinical - Medication Refill >> Feb 11, 2024  2:14 PM Aletta Edouard wrote: Most Recent Primary Care Visit:  Provider: Alease Medina  Department: [facility]  Visit Type: OFFICE VISIT  Date: 02/10/2024  Medication: muscle relaxer that starts with T  Has the patient contacted their pharmacy? Yes (Agent: If no, request that the patient contact the pharmacy for the refill. If patient does not wish to contact the pharmacy document the reason why and proceed with request.) (Agent: If yes, when and what did the pharmacy advise?)  Is this the correct pharmacy for this prescription? Yes If no, delete pharmacy and type the correct one.  This is the patient's preferred pharmacy:  [facility] [address], [place] - [address] [place] [address] Phone: [phone] Fax: [phone]   Has the prescription been filled recently? No  Is the patient out of the medication? Yes  Has the patient been seen for an appointment in the last year OR does the patient have an upcoming appointment? Yes  Can we respond through MyChart? No  Agent: Please be advised that Rx refills may take up to 3 business days. We ask that you follow-up with your pharmacy.

## 2024-02-12 DIAGNOSIS — R1310 Dysphagia, unspecified: Secondary | ICD-10-CM | POA: Insufficient documentation

## 2024-02-12 MED ORDER — TIZANIDINE HCL 4 MG PO CAPS: 4.0000 mg | ORAL_CAPSULE | Freq: Three times a day (TID) | ORAL | 0 refills | Status: DC | PRN

## 2024-02-12 NOTE — Assessment & Plan Note (Addendum)
 Increasing Ozempic to 2mg  a week.  A1c today was 7.2%.  Cut your Semglee down from 90 to 70 units a day.

## 2024-02-12 NOTE — Assessment & Plan Note (Signed)
 Having a hard time right now with her daughter and her boyfriend living with her.

## 2024-02-12 NOTE — Assessment & Plan Note (Signed)
 She request a B1`2 injection today

## 2024-02-12 NOTE — Assessment & Plan Note (Addendum)
 She reports that she gets choked while eating.  Offered her referral for swallowing study but she declined.  Discussed eating smaller bites and not talking while she eats.  She denies a fever, or productive cough

## 2024-02-16 ENCOUNTER — Telehealth: Payer: Self-pay | Admitting: Family Medicine

## 2024-02-16 ENCOUNTER — Other Ambulatory Visit: Payer: Self-pay | Admitting: Family Medicine

## 2024-02-16 DIAGNOSIS — S134XXD Sprain of ligaments of cervical spine, subsequent encounter: Secondary | ICD-10-CM

## 2024-02-16 DIAGNOSIS — Z794 Long term (current) use of insulin: Secondary | ICD-10-CM

## 2024-02-16 MED ORDER — BLOOD GLUCOSE MONITORING SUPPL DEVI: 1.0000 | Freq: Three times a day (TID) | 0 refills | Status: DC

## 2024-02-16 MED ORDER — TIZANIDINE HCL 4 MG PO CAPS: 4.0000 mg | ORAL_CAPSULE | Freq: Three times a day (TID) | ORAL | 0 refills | Status: AC | PRN

## 2024-02-16 MED ORDER — LANCETS MISC. MISC: 1.0000 | Freq: Three times a day (TID) | 0 refills | Status: AC

## 2024-02-16 MED ORDER — BLOOD GLUCOSE TEST VI STRP: 1.0000 | ORAL_STRIP | Freq: Three times a day (TID) | 0 refills | Status: DC

## 2024-02-16 MED ORDER — LANCET DEVICE MISC: 1.0000 | Freq: Three times a day (TID) | 0 refills | Status: AC

## 2024-02-16 MED ORDER — CYCLOBENZAPRINE HCL 5 MG PO TABS: 5.0000 mg | ORAL_TABLET | Freq: Three times a day (TID) | ORAL | 1 refills | Status: DC | PRN

## 2024-02-16 NOTE — Telephone Encounter (Signed)
 Asked patient to decrease her Semglee to 50 units daily because we are increasing her Ozempic to 2mg  weekly.

## 2024-02-16 NOTE — Telephone Encounter (Signed)
 Copied from CRM [id number]. Topic: Clinical - Prescription Issue >> Feb 16, 2024 11:22 AM [name] wrote: Reason for CRM: Patient has requested for the provider to prescribe a different muscle relaxer that is covered by her insurance; the current prescribed muscle relaxer(tiZANidine (ZANAFLEX) 4 MG capsule) is not covered by her insurance; please follow up with patient [phone]

## 2024-02-16 NOTE — Telephone Encounter (Signed)
 Patient has been informed of new prescription as well as provider's recommendation. Patient verbalized understanding. All questions and concerns have been addressed.

## 2024-02-18 ENCOUNTER — Encounter: Payer: Self-pay | Admitting: Internal Medicine

## 2024-02-18 ENCOUNTER — Observation Stay

## 2024-02-18 ENCOUNTER — Inpatient Hospital Stay
Admission: EM | Admit: 2024-02-18 | Discharge: 2024-02-20 | DRG: 092 | Disposition: A | Discharge status: Home / Self Care | Attending: Family Medicine | Admitting: Family Medicine

## 2024-02-18 ENCOUNTER — Other Ambulatory Visit: Payer: Self-pay

## 2024-02-18 ENCOUNTER — Emergency Department

## 2024-02-18 DIAGNOSIS — K589 Irritable bowel syndrome without diarrhea: Secondary | ICD-10-CM | POA: Diagnosis present

## 2024-02-18 DIAGNOSIS — G928 Other toxic encephalopathy: Principal | ICD-10-CM | POA: Diagnosis present

## 2024-02-18 DIAGNOSIS — E119 Type 2 diabetes mellitus without complications: Secondary | ICD-10-CM

## 2024-02-18 DIAGNOSIS — T50915A Adverse effect of multiple unspecified drugs, medicaments and biological substances, initial encounter: Secondary | ICD-10-CM | POA: Diagnosis present

## 2024-02-18 DIAGNOSIS — E785 Hyperlipidemia, unspecified: Secondary | ICD-10-CM | POA: Diagnosis present

## 2024-02-18 DIAGNOSIS — M48061 Spinal stenosis, lumbar region without neurogenic claudication: Secondary | ICD-10-CM | POA: Diagnosis present

## 2024-02-18 DIAGNOSIS — Z9049 Acquired absence of other specified parts of digestive tract: Secondary | ICD-10-CM

## 2024-02-18 DIAGNOSIS — I2510 Atherosclerotic heart disease of native coronary artery without angina pectoris: Secondary | ICD-10-CM | POA: Diagnosis present

## 2024-02-18 DIAGNOSIS — I10 Essential (primary) hypertension: Secondary | ICD-10-CM | POA: Diagnosis present

## 2024-02-18 DIAGNOSIS — E1169 Type 2 diabetes mellitus with other specified complication: Secondary | ICD-10-CM | POA: Diagnosis present

## 2024-02-18 DIAGNOSIS — Z22322 Carrier or suspected carrier of Methicillin resistant Staphylococcus aureus: Secondary | ICD-10-CM

## 2024-02-18 DIAGNOSIS — Z794 Long term (current) use of insulin: Secondary | ICD-10-CM

## 2024-02-18 DIAGNOSIS — N1831 Chronic kidney disease, stage 3a: Secondary | ICD-10-CM | POA: Diagnosis present

## 2024-02-18 DIAGNOSIS — R4182 Altered mental status, unspecified: Principal | ICD-10-CM | POA: Diagnosis present

## 2024-02-18 DIAGNOSIS — G9340 Encephalopathy, unspecified: Secondary | ICD-10-CM | POA: Diagnosis present

## 2024-02-18 DIAGNOSIS — R1310 Dysphagia, unspecified: Secondary | ICD-10-CM

## 2024-02-18 DIAGNOSIS — F411 Generalized anxiety disorder: Secondary | ICD-10-CM | POA: Diagnosis present

## 2024-02-18 DIAGNOSIS — M810 Age-related osteoporosis without current pathological fracture: Secondary | ICD-10-CM | POA: Diagnosis present

## 2024-02-18 DIAGNOSIS — Z955 Presence of coronary angioplasty implant and graft: Secondary | ICD-10-CM

## 2024-02-18 DIAGNOSIS — Z96659 Presence of unspecified artificial knee joint: Secondary | ICD-10-CM | POA: Diagnosis present

## 2024-02-18 DIAGNOSIS — Z7985 Long-term (current) use of injectable non-insulin antidiabetic drugs: Secondary | ICD-10-CM

## 2024-02-18 DIAGNOSIS — E039 Hypothyroidism, unspecified: Secondary | ICD-10-CM | POA: Diagnosis present

## 2024-02-18 DIAGNOSIS — Z7984 Long term (current) use of oral hypoglycemic drugs: Secondary | ICD-10-CM

## 2024-02-18 DIAGNOSIS — M1990 Unspecified osteoarthritis, unspecified site: Secondary | ICD-10-CM | POA: Diagnosis present

## 2024-02-18 DIAGNOSIS — Z90710 Acquired absence of both cervix and uterus: Secondary | ICD-10-CM

## 2024-02-18 DIAGNOSIS — N390 Urinary tract infection, site not specified: Secondary | ICD-10-CM | POA: Insufficient documentation

## 2024-02-18 DIAGNOSIS — Z79899 Other long term (current) drug therapy: Secondary | ICD-10-CM

## 2024-02-18 DIAGNOSIS — N3001 Acute cystitis with hematuria: Secondary | ICD-10-CM | POA: Diagnosis present

## 2024-02-18 DIAGNOSIS — Z789 Other specified health status: Secondary | ICD-10-CM | POA: Diagnosis present

## 2024-02-18 DIAGNOSIS — I130 Hypertensive heart and chronic kidney disease with heart failure and stage 1 through stage 4 chronic kidney disease, or unspecified chronic kidney disease: Secondary | ICD-10-CM | POA: Diagnosis present

## 2024-02-18 DIAGNOSIS — Z79890 Hormone replacement therapy (postmenopausal): Secondary | ICD-10-CM

## 2024-02-18 DIAGNOSIS — F32A Depression, unspecified: Secondary | ICD-10-CM | POA: Diagnosis present

## 2024-02-18 DIAGNOSIS — Z7982 Long term (current) use of aspirin: Secondary | ICD-10-CM

## 2024-02-18 DIAGNOSIS — F339 Major depressive disorder, recurrent, unspecified: Secondary | ICD-10-CM | POA: Diagnosis present

## 2024-02-18 DIAGNOSIS — G894 Chronic pain syndrome: Secondary | ICD-10-CM | POA: Diagnosis present

## 2024-02-18 DIAGNOSIS — D696 Thrombocytopenia, unspecified: Secondary | ICD-10-CM | POA: Diagnosis present

## 2024-02-18 DIAGNOSIS — E538 Deficiency of other specified B group vitamins: Secondary | ICD-10-CM | POA: Diagnosis present

## 2024-02-18 DIAGNOSIS — G4733 Obstructive sleep apnea (adult) (pediatric): Secondary | ICD-10-CM | POA: Diagnosis present

## 2024-02-18 DIAGNOSIS — K219 Gastro-esophageal reflux disease without esophagitis: Secondary | ICD-10-CM | POA: Diagnosis present

## 2024-02-18 DIAGNOSIS — I5032 Chronic diastolic (congestive) heart failure: Secondary | ICD-10-CM | POA: Diagnosis present

## 2024-02-18 DIAGNOSIS — Z6841 Body Mass Index (BMI) 40.0 and over, adult: Secondary | ICD-10-CM

## 2024-02-18 DIAGNOSIS — J398 Other specified diseases of upper respiratory tract: Secondary | ICD-10-CM | POA: Diagnosis present

## 2024-02-18 DIAGNOSIS — F410 Panic disorder [episodic paroxysmal anxiety] without agoraphobia: Secondary | ICD-10-CM | POA: Diagnosis present

## 2024-02-18 DIAGNOSIS — Z1152 Encounter for screening for COVID-19: Secondary | ICD-10-CM

## 2024-02-18 DIAGNOSIS — J9809 Other diseases of bronchus, not elsewhere classified: Secondary | ICD-10-CM | POA: Diagnosis present

## 2024-02-18 DIAGNOSIS — E1122 Type 2 diabetes mellitus with diabetic chronic kidney disease: Secondary | ICD-10-CM | POA: Diagnosis present

## 2024-02-18 LAB — CBC WITH DIFFERENTIAL/PLATELET
Abs Immature Granulocytes: 0.09 10*3/uL — ABNORMAL HIGH (ref 0.00–0.07)
Basophils Absolute: 0 10*3/uL (ref 0.0–0.1)
Basophils Relative: 0 %
Eosinophils Absolute: 0.1 10*3/uL (ref 0.0–0.5)
Eosinophils Relative: 1 %
HCT: 43.4 % (ref 36.0–46.0)
Hemoglobin: 14.1 g/dL (ref 12.0–15.0)
Immature Granulocytes: 1 %
Lymphocytes Relative: 13 %
Lymphs Abs: 1.6 10*3/uL (ref 0.7–4.0)
MCH: 29.7 pg (ref 26.0–34.0)
MCHC: 32.5 g/dL (ref 30.0–36.0)
MCV: 91.6 fL (ref 80.0–100.0)
Monocytes Absolute: 0.5 10*3/uL (ref 0.1–1.0)
Monocytes Relative: 4 %
Neutro Abs: 9.7 10*3/uL — ABNORMAL HIGH (ref 1.7–7.7)
Neutrophils Relative %: 81 %
Platelets: 135 10*3/uL — ABNORMAL LOW (ref 150–400)
RBC: 4.74 MIL/uL (ref 3.87–5.11)
RDW: 13.6 % (ref 11.5–15.5)
WBC: 12 10*3/uL — ABNORMAL HIGH (ref 4.0–10.5)
nRBC: 0 % (ref 0.0–0.2)

## 2024-02-18 LAB — URINALYSIS, W/ REFLEX TO CULTURE (INFECTION SUSPECTED)
Bilirubin Urine: NEGATIVE
Glucose, UA: NEGATIVE mg/dL
Hgb urine dipstick: NEGATIVE
Ketones, ur: NEGATIVE mg/dL
Nitrite: NEGATIVE
Protein, ur: 30 mg/dL — AB
Specific Gravity, Urine: 1.019 (ref 1.005–1.030)
WBC, UA: >50 WBC/hpf (ref 0–5)
pH: 5 (ref 5.0–8.0)

## 2024-02-18 LAB — COMPREHENSIVE METABOLIC PANEL WITH GFR
ALT: 22 U/L (ref 0–44)
AST: 24 U/L (ref 15–41)
Albumin: 4 g/dL (ref 3.5–5.0)
Alkaline Phosphatase: 65 U/L (ref 38–126)
Anion gap: 7 (ref 5–15)
BUN: 23 mg/dL (ref 8–23)
CO2: 23 mmol/L (ref 22–32)
Calcium: 9.2 mg/dL (ref 8.9–10.3)
Chloride: 110 mmol/L (ref 98–111)
Creatinine, Ser: 1.11 mg/dL — ABNORMAL HIGH (ref 0.44–1.00)
GFR, Estimated: 52 mL/min — ABNORMAL LOW (ref >60)
Glucose, Bld: 187 mg/dL — ABNORMAL HIGH (ref 70–99)
Potassium: 4 mmol/L (ref 3.5–5.1)
Sodium: 140 mmol/L (ref 135–145)
Total Bilirubin: 0.8 mg/dL (ref 0.0–1.2)
Total Protein: 7.7 g/dL (ref 6.5–8.1)

## 2024-02-18 LAB — MRSA NEXT GEN BY PCR, NASAL: MRSA by PCR Next Gen: DETECTED — AB

## 2024-02-18 LAB — AMMONIA: Ammonia: 31 umol/L (ref 9–35)

## 2024-02-18 LAB — URINE DRUG SCREEN, QUALITATIVE (ARMC ONLY)
Amphetamines, Ur Screen: NOT DETECTED
Barbiturates, Ur Screen: NOT DETECTED
Benzodiazepine, Ur Scrn: NOT DETECTED
Cannabinoid 50 Ng, Ur ~~LOC~~: NOT DETECTED
Cocaine Metabolite,Ur ~~LOC~~: NOT DETECTED
MDMA (Ecstasy)Ur Screen: NOT DETECTED
Methadone Scn, Ur: NOT DETECTED
Opiate, Ur Screen: NOT DETECTED
Phencyclidine (PCP) Ur S: NOT DETECTED
Tricyclic, Ur Screen: POSITIVE — AB

## 2024-02-18 LAB — RESP PANEL BY RT-PCR (RSV, FLU A&B, COVID)  RVPGX2
Influenza A by PCR: NEGATIVE
Influenza B by PCR: NEGATIVE
Resp Syncytial Virus by PCR: NEGATIVE
SARS Coronavirus 2 by RT PCR: NEGATIVE

## 2024-02-18 LAB — ACETAMINOPHEN LEVEL: Acetaminophen (Tylenol), Serum: <10 ug/mL — ABNORMAL LOW (ref 10–30)

## 2024-02-18 LAB — LACTIC ACID, PLASMA
Lactic Acid, Venous: 1.7 mmol/L (ref 0.5–1.9)
Lactic Acid, Venous: 1.9 mmol/L (ref 0.5–1.9)

## 2024-02-18 LAB — GLUCOSE, CAPILLARY
Glucose-Capillary: 138 mg/dL — ABNORMAL HIGH (ref 70–99)
Glucose-Capillary: 92 mg/dL (ref 70–99)

## 2024-02-18 LAB — CK: Total CK: 231 U/L (ref 38–234)

## 2024-02-18 LAB — ETHANOL: Alcohol, Ethyl (B): <10 mg/dL (ref <10)

## 2024-02-18 LAB — SALICYLATE LEVEL: Salicylate Lvl: <7 mg/dL — ABNORMAL LOW (ref 7.0–30.0)

## 2024-02-18 MED ORDER — ENOXAPARIN SODIUM 40 MG/0.4ML IJ SOSY
40.0000 mg | PREFILLED_SYRINGE | INTRAMUSCULAR | Status: DC
  Administered 2024-02-18 – 2024-02-19 (×2): 40 mg via SUBCUTANEOUS
  Filled 2024-02-18 (×2): qty 0.4

## 2024-02-18 MED ORDER — MUPIROCIN 2 % EX OINT
1.0000 | TOPICAL_OINTMENT | Freq: Two times a day (BID) | CUTANEOUS | Status: DC
  Administered 2024-02-18 – 2024-02-20 (×4): 1 via NASAL
  Filled 2024-02-18: qty 22

## 2024-02-18 MED ORDER — CHLORHEXIDINE GLUCONATE CLOTH 2 % EX PADS
6.0000 | MEDICATED_PAD | Freq: Every day | CUTANEOUS | Status: DC
  Administered 2024-02-18 – 2024-02-20 (×3): 6 via TOPICAL

## 2024-02-18 MED ORDER — ACETAMINOPHEN 325 MG PO TABS
650.0000 mg | ORAL_TABLET | ORAL | Status: DC | PRN
  Administered 2024-02-20: 650 mg via ORAL
  Filled 2024-02-18: qty 2

## 2024-02-18 MED ORDER — CHLORHEXIDINE GLUCONATE CLOTH 2 % EX PADS: 6.0000 | MEDICATED_PAD | Freq: Every day | CUTANEOUS | Status: DC

## 2024-02-18 MED ORDER — SENNOSIDES-DOCUSATE SODIUM 8.6-50 MG PO TABS: 1.0000 | ORAL_TABLET | Freq: Every evening | ORAL | Status: DC | PRN

## 2024-02-18 MED ORDER — CEFTRIAXONE SODIUM 2 G IJ SOLR
2.0000 g | INTRAMUSCULAR | Status: DC
  Administered 2024-02-19: 2 g via INTRAVENOUS
  Filled 2024-02-18: qty 20

## 2024-02-18 MED ORDER — INSULIN ASPART 100 UNIT/ML IJ SOLN
0.0000 [IU] | Freq: Three times a day (TID) | INTRAMUSCULAR | Status: DC
  Administered 2024-02-18 – 2024-02-19 (×3): 2 [IU] via SUBCUTANEOUS
  Administered 2024-02-20: 3 [IU] via SUBCUTANEOUS
  Administered 2024-02-20: 5 [IU] via SUBCUTANEOUS
  Filled 2024-02-18 (×5): qty 1

## 2024-02-18 MED ORDER — QUETIAPINE FUMARATE 25 MG PO TABS
200.0000 mg | ORAL_TABLET | Freq: Every day | ORAL | Status: DC
  Administered 2024-02-19: 200 mg via ORAL
  Filled 2024-02-18 (×2): qty 8

## 2024-02-18 MED ORDER — INSULIN GLARGINE-YFGN 100 UNIT/ML ~~LOC~~ SOLN
50.0000 [IU] | Freq: Two times a day (BID) | SUBCUTANEOUS | Status: DC
  Filled 2024-02-18 (×2): qty 0.5

## 2024-02-18 MED ORDER — HYDRALAZINE HCL 20 MG/ML IJ SOLN: 5.0000 mg | Freq: Four times a day (QID) | INTRAMUSCULAR | Status: AC | PRN

## 2024-02-18 MED ORDER — ACETAMINOPHEN 650 MG RE SUPP: 650.0000 mg | RECTAL | Status: DC | PRN

## 2024-02-18 MED ORDER — LEVOTHYROXINE SODIUM 50 MCG PO TABS
75.0000 ug | ORAL_TABLET | Freq: Every day | ORAL | Status: DC
  Administered 2024-02-20: 75 ug via ORAL
  Filled 2024-02-18: qty 1

## 2024-02-18 MED ORDER — INSULIN GLARGINE-YFGN 100 UNIT/ML ~~LOC~~ SOPN
50.0000 [IU] | PEN_INJECTOR | Freq: Two times a day (BID) | SUBCUTANEOUS | Status: DC
  Filled 2024-02-18: qty 3

## 2024-02-18 MED ORDER — LORAZEPAM 2 MG/ML IJ SOLN
0.5000 mg | Freq: Four times a day (QID) | INTRAMUSCULAR | Status: DC | PRN
  Administered 2024-02-18 – 2024-02-19 (×2): 0.5 mg via INTRAVENOUS
  Filled 2024-02-18 (×3): qty 1

## 2024-02-18 MED ORDER — PANTOPRAZOLE SODIUM 40 MG PO TBEC: 40.0000 mg | DELAYED_RELEASE_TABLET | Freq: Every day | ORAL | Status: DC | PRN

## 2024-02-18 MED ORDER — VILAZODONE HCL 20 MG PO TABS
40.0000 mg | ORAL_TABLET | Freq: Every day | ORAL | Status: DC
  Administered 2024-02-19 – 2024-02-20 (×2): 40 mg via ORAL
  Filled 2024-02-18 (×2): qty 2

## 2024-02-18 MED ORDER — ZOLPIDEM TARTRATE 5 MG PO TABS
5.0000 mg | ORAL_TABLET | Freq: Every day | ORAL | Status: DC
  Administered 2024-02-19: 5 mg via ORAL
  Filled 2024-02-18 (×2): qty 1

## 2024-02-18 MED ORDER — TIZANIDINE HCL 4 MG PO TABS: 4.0000 mg | ORAL_TABLET | Freq: Three times a day (TID) | ORAL | Status: DC | PRN

## 2024-02-18 MED ORDER — ROSUVASTATIN CALCIUM 20 MG PO TABS
20.0000 mg | ORAL_TABLET | Freq: Every day | ORAL | Status: DC
  Administered 2024-02-19 – 2024-02-20 (×2): 20 mg via ORAL
  Filled 2024-02-18 (×2): qty 1

## 2024-02-18 MED ORDER — VITAMIN D3 25 MCG (1000 UNIT) PO TABS
1000.0000 [IU] | ORAL_TABLET | Freq: Every day | ORAL | Status: DC
  Administered 2024-02-19 – 2024-02-20 (×2): 1000 [IU] via ORAL
  Filled 2024-02-18 (×4): qty 1

## 2024-02-18 MED ORDER — ACETAMINOPHEN 160 MG/5ML PO SOLN: 650.0000 mg | ORAL | Status: DC | PRN

## 2024-02-18 MED ORDER — GABAPENTIN 300 MG PO CAPS: 600.0000 mg | ORAL_CAPSULE | Freq: Three times a day (TID) | ORAL | Status: DC

## 2024-02-18 MED ORDER — SODIUM CHLORIDE 0.9 % IV SOLN
2.0000 g | Freq: Once | INTRAVENOUS | Status: AC
  Administered 2024-02-18: 2 g via INTRAVENOUS
  Filled 2024-02-18: qty 20

## 2024-02-18 MED ORDER — ONDANSETRON HCL 4 MG/2ML IJ SOLN
4.0000 mg | Freq: Once | INTRAMUSCULAR | Status: AC
Start: 2024-02-18 — End: 2024-02-18
  Administered 2024-02-18: 4 mg via INTRAVENOUS
  Filled 2024-02-18: qty 2

## 2024-02-18 MED ORDER — ASPIRIN 81 MG PO TBEC
81.0000 mg | DELAYED_RELEASE_TABLET | Freq: Every day | ORAL | Status: DC
  Administered 2024-02-19 – 2024-02-20 (×2): 81 mg via ORAL
  Filled 2024-02-18 (×2): qty 1

## 2024-02-18 MED ORDER — STROKE: EARLY STAGES OF RECOVERY BOOK: Freq: Once | Status: AC

## 2024-02-18 MED ORDER — INSULIN ASPART 100 UNIT/ML IJ SOLN: 0.0000 [IU] | Freq: Every day | INTRAMUSCULAR | Status: DC

## 2024-02-18 NOTE — ED Notes (Signed)
 This RN is attempting to fix leads in

## 2024-02-18 NOTE — Assessment & Plan Note (Signed)
 POA, continue ceftriaxone 2 g IV daily to complete a 5-day course

## 2024-02-18 NOTE — Assessment & Plan Note (Addendum)
 Rosuvastatin 20 mg daily resumed on admission

## 2024-02-18 NOTE — Assessment & Plan Note (Signed)
 Etiology workup in progress, differentials are infectious etiology including pneumonia/UTI, TIA Check portable chest x-ray, MRSA screening, lactic acid Continue ceftriaxone 2 g IV daily to complete a 5-day course

## 2024-02-18 NOTE — Assessment & Plan Note (Signed)
Home rosuvastatin 20 mg daily resumed

## 2024-02-18 NOTE — Progress Notes (Signed)
 Pt mental status too altered. Unable to follow commands or answer questions appropriately. Unable to perform bedside swallow screen or complete admission database questions.
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
## 2024-02-18 NOTE — H&P (Addendum)
 History and Physical   [name] QMV:[id number] DOB: Oct 28, 1946 DOA: 02/18/2024  PCP: Whitney Medina, MD  Outpatient Specialists: Dr. Hedy Jacob clinic pulmonology Patient coming from: Home via EMS  I have personally briefly reviewed patient's old medical records in [facility] EMR.  Chief Concern: Altered mental status  HPI: Ms. [name] is a 78 year old female with history of hypertension, hyperlipidemia, CKD 3A, depression, anxiety, obstructive sleep apnea, CAD status post PCI to LAD, hypothyroid, insulin-dependent diabetes mellitus, who presents emergency department from home for chief concerns of altered mentation.  Vitals in the ED showed temperature of 98.9, respiration rate 18, heart rate of 64, blood pressure 119/85, SpO2 100% on room air.  Serum sodium is 140, potassium 4.0, chloride 110, bicarb 23, BUN of 23, serum creatinine 1.11, EGFR 52, nonfasting blood glucose 187, WBC 12.0, hemoglobin 14.1, platelets of 135.  COVID/influenza A/influenza B/RSV PCR were negative.  UA was positive for moderate leukocytes.  Ammonia level was 31.  ED treatment: Ondansetron 4 mg IV one-time dose, ceftriaxone 2 g IV one-time dose. --------------------------------------- At bedside, patient is snoring in her bed.  Patient just received Ativan due to agitation and confusion.  Social history: Patient lives at home.  ROS: Unable to complete due to patient sleeping + snoring deeply  ED Course: Discussed with EDP, patient requiring hospitalization for chief concerns of altered mental status suspect secondary to UTI.  Assessment/Plan  Principal Problem:   Altered mental status Active Problems:   Tracheobronchomalacia   HLD (hyperlipidemia)   Depression   Osteoporosis   GERD (gastroesophageal reflux disease)   Essential hypertension   CAD (coronary artery disease)   CPAP ventilation treatment not tolerated   Morbid obesity with BMI of 45.0-49.9, adult (HCC)    Polypharmacy   Lumbar foraminal stenosis   Hypothyroidism   GAD (generalized anxiety disorder)   Major depressive disorder, recurrent episode (HCC)   Insulin dependent type 2 diabetes mellitus (HCC)   Hypertension   Swallowing difficulty   MRSA nasal colonization   UTI (urinary tract infection)   Assessment and Plan:  * Altered mental status Etiology workup in progress, differentials are infectious etiology including pneumonia/UTI, TIA Check portable chest x-ray, MRSA screening, lactic acid Continue ceftriaxone 2 g IV daily to complete a 5-day course  UTI (urinary tract infection) POA, continue ceftriaxone 2 g IV daily to complete a 5-day course  MRSA nasal colonization Contact precaution  Hypertension Hydralazine 5 mg IV every 6 hours as needed for SBP > 180, 38 hours ordered  Insulin dependent type 2 diabetes mellitus (HCC) Insulin SSI with at bedtime coverage ordered Patient takes Semglee 50 units twice daily, which was resumed on admission  Major depressive disorder, recurrent episode (HCC) Home Vilazodone/Viibryd 40 mg daily resumed  Hypothyroidism Home levothyroxine 75 mcg daily before breakfast resumed  Polypharmacy Home gabapentin 600 mg 3 times daily not resumed on admission AM team to resume when the benefits outweigh the risk  Morbid obesity with BMI of 45.0-49.9, adult (HCC) This complicates overall care and prognosis.   CAD (coronary artery disease) Rosuvastatin 20 mg daily resumed on admission  HLD (hyperlipidemia) Home rosuvastatin 20 mg daily resumed  Fall, aspiration precautions  Chart reviewed.   DVT prophylaxis: Enoxaparin Code Status: full code Diet: N.p.o. as patient was too altered for swallow screening attempt Family Communication: Called daughter, [name] at [phone] and operator message received stating my call can not be completed. Disposition Plan: Pending clinical course Consults called: PT, OT for  evaluation on  02/19/2024 Admission status: Telemetry medical, observation  Past Medical History:  Diagnosis Date   Amnesia    Anxiety    Arthritis    Back pain    CHF (congestive heart failure) (HCC)    Chronic kidney disease    Acute on chronic 10/2020   Chronic pain syndrome    Cystocele    Depression    Diabetes mellitus without complication (HCC)    Type II   GERD (gastroesophageal reflux disease)    Hyperlipidemia    Hypertension    IBS (irritable bowel syndrome)    Obesity    Osteoporosis    Panic attacks    Seizure (HCC)    Shortness of breath dyspnea    Sleep apnea    not using now   Thyroid nodule    Past Surgical History:  Procedure Laterality Date   ABDOMINAL HYSTERECTOMY     APPENDECTOMY     BACK SURGERY     CARDIAC CATHETERIZATION     CHOLECYSTECTOMY     CORONARY STENT INTERVENTION N/A 05/05/2018   Procedure: CORONARY STENT INTERVENTION;  Surgeon: Percival Brace, MD;  Location: [facility] INVASIVE CV LAB;  Service: Cardiovascular;  Laterality: N/A;   FOOT SURGERY     LEFT HEART CATH AND CORONARY ANGIOGRAPHY Left 05/05/2018   Procedure: LEFT HEART CATH AND CORONARY ANGIOGRAPHY;  Surgeon: Percival Brace, MD;  Location: [facility] INVASIVE CV LAB;  Service: Cardiovascular;  Laterality: Left;   OOPHORECTOMY     REPLACEMENT TOTAL KNEE     TONSILLECTOMY     Social History:  reports that she has never smoked. She has never been exposed to tobacco smoke. She has never used smokeless tobacco. She reports that she does not drink alcohol and does not use drugs.  No Known Allergies Family History  Problem Relation Age of Onset   Stomach cancer Mother    Cancer Father    Cancer - Lung Father    Kidney cancer Sister    Thyroid disease Neg Hx    Family history: Family history reviewed and not pertinent.  Prior to Admission medications   Medication Sig Start Date End Date Taking? Authorizing Provider  aspirin EC 81 MG tablet Take 1 tablet (81 mg total) by mouth daily.  Swallow whole. 09/16/23   Faith Homes, MD  Blood Glucose Monitoring Suppl DEVI 1 each by Does not apply route in the morning, at noon, and at bedtime. May substitute to any manufacturer covered by patient's insurance. 12/10/23   Ziglar, Susan K, MD  Blood Glucose Monitoring Suppl DEVI 1 each by Does not apply route in the morning, at noon, and at bedtime. May substitute to any manufacturer covered by patient's insurance. 02/16/24   Ziglar, Susan K, MD  Blood Glucose Monitoring Suppl DEVI 1 each by Does not apply route in the morning, at noon, and at bedtime. May substitute to any manufacturer covered by patient's insurance. 02/16/24   Ziglar, Susan K, MD  busPIRone (BUSPAR) 15 MG tablet Take by mouth. 05/30/23   [provider]  Cholecalciferol 25 MCG (1000 UT) tablet Take 1,000 Units by mouth daily.    [provider]  clonazePAM (KLONOPIN) 0.5 MG tablet Take 0.5 mg by mouth 2 (two) times daily. 12/16/17   [provider]  cyclobenzaprine (FLEXERIL) 5 MG tablet Take 1 tablet (5 mg total) by mouth 3 (three) times daily as needed for muscle spasms. 02/16/24   Ziglar, Susan K, MD  cycloSPORINE (RESTASIS) 0.05 % ophthalmic  emulsion 1 drop 2 (two) times daily. 12/15/23   [provider]  dapagliflozin propanediol (FARXIGA) 10 MG TABS tablet Take by mouth.    [provider]  doxycycline (VIBRAMYCIN) 100 MG capsule Take 1 capsule (100 mg total) by mouth 2 (two) times daily. 12/10/23   Ziglar, Eli Phillips, MD  furosemide (LASIX) 40 MG tablet Take 1 tablet (40 mg total) by mouth daily as needed. Please hold for next few days 01/01/22   Arnetha Courser, MD  gabapentin (NEURONTIN) 600 MG tablet Take 1,200 mg by mouth 2 (two) times daily.    [provider]  Glucose Blood (BLOOD GLUCOSE TEST STRIPS) STRP 1 each by In Vitro route in the morning, at noon, and at bedtime. May substitute to any manufacturer covered by patient's insurance. 02/16/24 03/17/24  Ziglar, Eli Phillips, MD   insulin aspart (NOVOLOG) 100 UNIT/ML injection Inject 3-20 units into the skin four times daily.  Before meals and at bedtime.  CBG 121-150: 3 units, CBG 151-200: 4 units, CBG 201-250: 7 units, CBG 251-300: 11 units, CBG 301-350: 15 units, CBG 351-420 units, CBG > 400 call MD 12/10/23   Ziglar, Eli Phillips, MD  Lancet Device MISC 1 each by Does not apply route in the morning, at noon, and at bedtime. May substitute to any manufacturer covered by patient's insurance. 02/16/24 03/17/24  Ziglar, Eli Phillips, MD  Lancets Misc. MISC 1 each by Does not apply route in the morning, at noon, and at bedtime. May substitute to any manufacturer covered by patient's insurance. 02/16/24 03/17/24  Ziglar, Eli Phillips, MD  levothyroxine (SYNTHROID) 75 MCG tablet Take 1 tablet (75 mcg total) by mouth daily before breakfast. 02/08/20   Arnetha Courser, MD  metoprolol succinate (TOPROL-XL) 50 MG 24 hr tablet Take by mouth. 10/14/23 10/13/24  [provider]  montelukast (SINGULAIR) 10 MG tablet Take 1 tablet (10 mg total) by mouth daily. 09/17/23   Lewie Chamber, MD  oxyCODONE-acetaminophen (PERCOCET) 10-325 MG tablet Take 1 tablet by mouth 4 (four) times daily as needed. 11/22/23   [provider]  pantoprazole (PROTONIX) 40 MG tablet Take 40 mg by mouth daily as needed (heartburn).    [provider]  QUEtiapine (SEROQUEL) 100 MG tablet Take 100 mg by mouth at bedtime.    [provider]  rosuvastatin (CRESTOR) 20 MG tablet Take 1 tablet by mouth in the morning. 08/11/23   [provider]  Semaglutide, 2 MG/DOSE, (OZEMPIC, 2 MG/DOSE,) 8 MG/3ML SOPN Inject 2 mg into the skin once a week. 02/10/24   Ziglar, Eli Phillips, MD  SEMGLEE, YFGN, 100 UNIT/ML Pen Inject 70 Units into the skin 2 (two) times daily. Patient taking differently: Inject 90 Units into the skin 2 (two) times daily. 09/16/23   Lewie Chamber, MD  tiZANidine (ZANAFLEX) 4 MG capsule Take 1 capsule (4 mg total) by mouth 3 (three) times daily  as needed for up to 10 days. 02/16/24 02/26/24  Ziglar, Eli Phillips, MD  VIIBRYD 40 MG TABS Take 40 mg by mouth daily. 02/01/21   [provider]  zolpidem (AMBIEN) 10 MG tablet Take 5-10 mg by mouth at bedtime. 08/08/23   [provider]    Physical Exam: Vitals:   02/18/24 1400 02/18/24 1435 02/18/24 1513 02/18/24 1605  BP: (!) 132/92 (!) 109/93 (!) 140/72   Pulse: 96 (!) 105 93   Resp: 18 20 18    Temp:   98.1 F (36.7 C)   TempSrc:   Axillary  SpO2: 95% 98% 96%   Weight:    111.3 kg  Height:    5\' 4"  (1.626 m)   Constitutional: appears age-appropriate, frail, NAD, calm Eyes: PERRL, lids and conjunctivae normal ENMT: Mucous membranes are moist. Posterior pharynx clear of any exudate or lesions. Age-appropriate dentition.  Unable to assess hearing. Neck: normal, supple, no masses, no thyromegaly Respiratory: clear to auscultation bilaterally, no wheezing, no crackles. Normal respiratory effort. No accessory muscle use.  Cardiovascular: Regular rate and rhythm, no murmurs / rubs / gallops. No extremity edema. 2+ pedal pulses. No carotid bruits.  Abdomen: Obese abdomen, no tenderness, no masses palpated, no hepatosplenomegaly. Bowel sounds positive.  Musculoskeletal: no clubbing / cyanosis. No joint deformity upper and lower extremities. Good ROM, no contractures, no atrophy. Normal muscle tone.  Skin: no rashes, lesions, ulcers. No induration Neurologic: Unable to assess sensation, strength.  Patient is snoring loudly.  Psychiatric: Unable to assess judgment, insight, alertness, orientation, mood  EKG: Not indicated at this time  Chest x-ray on Admission: Negative for acute cardiopulmonary process  CT Cervical Spine Wo Contrast Result Date: 02/18/2024 CLINICAL DATA:  78 year old female with altered mental status, found down. EXAM: CT CERVICAL SPINE WITHOUT CONTRAST TECHNIQUE: Multidetector CT imaging of the cervical spine was performed without intravenous contrast.  Multiplanar CT image reconstructions were also generated. RADIATION DOSE REDUCTION: This exam was performed according to the departmental dose-optimization program which includes automated exposure control, adjustment of the mA and/or kV according to patient size and/or use of iterative reconstruction technique. COMPARISON:  Head CT today. Cervical spine CT 07/20/2021. Brain MRI 02/16/2021, cervical spine MRI 02/18/2021. FINDINGS: Alignment: Normal cervical lordosis. Cervicothoracic junction alignment is within normal limits. Bilateral posterior element alignment is within normal limits. Skull base and vertebrae: Bone mineralization is within normal limits. Visualized skull base is intact. No atlanto-occipital dissociation. C1 and C2 appear intact and aligned. No acute osseous abnormality identified. Soft tissues and spinal canal: No prevertebral fluid or swelling. No visible canal hematoma. Chronic left posterior cervicomedullary junction partially calcified intraspinal but extra medullary mass (sagittal image 51 and coronal image 54) is about 11 mm long axis and stable to 1 mm larger since 2022. No significant regional mass effect. And otherwise the cervicomedullary junction appears negative. Disc levels:  Stable mild for age cervical spine degeneration. Upper chest: Chronic T2 compression fracture with progressed but degenerative appearing inferior endplate irregularity since 2022. Stable lung apex ventilation. Negative visible noncontrast thoracic inlet soft tissues. IMPRESSION: 1. No acute traumatic injury identified in the cervical spine. Mild for age cervical spine degeneration. 2. 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 This is stable to 1 mm larger since 2022, with no significant mass effect or other complicating features. 3. Chronic T2 compression fracture. Electronically Signed   By: Odessa Fleming M.D.   On: 02/18/2024 13:08   CT  Head Wo Contrast Result Date: 02/18/2024 CLINICAL DATA:  78 year old female with altered mental status, found down. EXAM: CT HEAD WITHOUT CONTRAST TECHNIQUE: Contiguous axial images were obtained from the base of the skull through the vertex without intravenous contrast. RADIATION DOSE REDUCTION: This exam was performed according to the departmental dose-optimization program which includes automated exposure control, adjustment of the mA and/or kV according to patient size and/or use of iterative reconstruction technique. COMPARISON:  Head CT 07/20/2021.  Brain MRI 02/16/2021. FINDINGS: Brain: Chronic hyperdense, calcified and rounded 11 mm mass at the posterior cisterna magna, left posterior cervicomedullary  junction is visible on series 2, image 1, grossly stable since 2022. No regional mass effect or edema is evident. See also cervical spine CT reported separately today. No superimposed midline shift or other intracranial mass effect. No ventriculomegaly. No acute intracranial hemorrhage identified. Cerebral volume is stable and normal for age. Gray-white differentiation is stable and normal for age. Stable mild basal ganglia vascular calcifications. Vascular: Calcified atherosclerosis at the skull base. No suspicious intracranial vascular hyperdensity. Skull: No fracture identified. Sinuses/Orbits: Visualized paranasal sinuses and mastoids are stable and well aerated. Other: No acute orbit or scalp soft tissue injury identified. *SCRATCH* heard chronic scalp sebaceous type cysts. IMPRESSION: 1. No acute intracranial abnormality or acute traumatic injury identified. 2. Chronic left posterior cisterna magna calcified mass is grossly stable since 2022, approximately 11 mm. See also Cervical Spine CT today. Electronically Signed   By: Marlise Simpers M.D.   On: 02/18/2024 13:02   Labs on Admission: I have personally reviewed following labs  CBC: Recent Labs  Lab 02/18/24 1141  WBC 12.0*  NEUTROABS 9.7*  HGB 14.1   HCT 43.4  MCV 91.6  PLT 135*   Basic Metabolic Panel: Recent Labs  Lab 02/18/24 1141  NA 140  K 4.0  CL 110  CO2 23  GLUCOSE 187*  BUN 23  CREATININE 1.11*  CALCIUM 9.2   GFR: Estimated Creatinine Clearance: 52.6 mL/min (A) (by C-G formula based on SCr of 1.11 mg/dL (H)).  Liver Function Tests: Recent Labs  Lab 02/18/24 1141  AST 24  ALT 22  ALKPHOS 65  BILITOT 0.8  PROT 7.7  ALBUMIN 4.0   No results for input(s): "LIPASE", "AMYLASE" in the last 168 hours. Recent Labs  Lab 02/18/24 1200  AMMONIA 31   Cardiac Enzymes: Recent Labs  Lab 02/18/24 1141  CKTOTAL 231   CBG: Recent Labs  Lab 02/18/24 1702  GLUCAP 138*   Urine analysis:    Component Value Date/Time   COLORURINE YELLOW (A) 02/18/2024 1141   APPEARANCEUR CLOUDY (A) 02/18/2024 1141   APPEARANCEUR Cloudy (A) 08/27/2021 1432   LABSPEC 1.019 02/18/2024 1141   LABSPEC 1.024 01/30/2014 0026   PHURINE 5.0 02/18/2024 1141   GLUCOSEU NEGATIVE 02/18/2024 1141   GLUCOSEU >=500 01/30/2014 0026   HGBUR NEGATIVE 02/18/2024 1141   BILIRUBINUR NEGATIVE 02/18/2024 1141   BILIRUBINUR Negative 08/27/2021 1432   BILIRUBINUR Negative 01/30/2014 0026   KETONESUR NEGATIVE 02/18/2024 1141   PROTEINUR 30 (A) 02/18/2024 1141   UROBILINOGEN 1.0 07/11/2008 1245   NITRITE NEGATIVE 02/18/2024 1141   LEUKOCYTESUR MODERATE (A) 02/18/2024 1141   LEUKOCYTESUR Trace 01/30/2014 0026   This document was prepared using Dragon Voice Recognition software and may include unintentional dictation errors.  Dr. Reinhold Carbine [facility]  If 7PM-7AM, please contact overnight-coverage provider If 7AM-7PM, please contact day attending provider [email or web address]  02/18/2024, 5:59 PM

## 2024-02-18 NOTE — Assessment & Plan Note (Signed)
 Home Vilazodone/Viibryd 40 mg daily resumed

## 2024-02-18 NOTE — Assessment & Plan Note (Signed)
 Home gabapentin 600 mg 3 times daily not resumed on admission AM team to resume when the benefits outweigh the risk

## 2024-02-18 NOTE — Assessment & Plan Note (Addendum)
 Hydralazine 5 mg IV every 6 hours as needed for SBP > 180, 38 hours ordered

## 2024-02-18 NOTE — Assessment & Plan Note (Signed)
 -  This complicates overall care and prognosis.

## 2024-02-18 NOTE — ED Provider Notes (Signed)
 [facility] Provider Note    Event Date/Time   First MD Initiated Contact with Patient 02/18/24 1130     (approximate)   History   Altered Mental Status   HPI [name] is a 78 y.o. female with history of anxiety, depression, CKD stage III, HFpEF, HTN, HLD, hypothyroidism presented today for altered mental status.  Patient was reportedly found this morning down on the ground and altered.  Last known well was at 11 PM last night.  Blood sugar greater than 100 with EMS.  They noted she was initially not really saying much and just slightly moving her extremities on exam.  As they brought her in, she started to state her name but otherwise cannot give anything else.  No obvious one-sided deficits over the other.  Vital signs otherwise stable.  On arrival, she is only able to state her name but otherwise confused and noncontributory to history.  Review of her home medications does show she is on multiple different pain medications.     Physical Exam   Triage Vital Signs: ED Triage Vitals  Encounter Vitals Group     BP      Systolic BP Percentile      Diastolic BP Percentile      Pulse      Resp      Temp      Temp src      SpO2      Weight      Height      Head Circumference      Peak Flow      Pain Score      Pain Loc      Pain Education      Exclude from Growth Chart     Most recent vital signs: Vitals:   02/18/24 1435 02/18/24 1513  BP: (!) 109/93 (!) 140/72  Pulse: (!) 105 93  Resp: 20 18  Temp:  98.1 F (36.7 C)  SpO2: 98% 96%    Physical Exam: I have reviewed the vital signs and nursing notes. General: Awake, alert, only able to state name when asked questions.  Otherwise cannot participate extensively on exam Head:  Atraumatic, normocephalic.   ENT:  EOM intact, PERRL. Oral mucosa is pink and moist with no lesions. Neck: Neck is supple with full range of motion, No meningeal signs. Cardiovascular:  RRR, No murmurs.  Peripheral pulses palpable and equal bilaterally. Respiratory:  Symmetrical chest wall expansion.  No rhonchi, rales, or wheezes.  Good air movement throughout.  No use of accessory muscles.   Musculoskeletal:  No cyanosis or edema. Moving extremities with full ROM Abdomen:  Soft, nontender, nondistended. Neuro:  GCS 15, moving all four extremities, interacting appropriately. Speech clear. Psych:  Calm, appropriate.   Skin:  Warm, dry, no rash.    ED Results / Procedures / Treatments   Labs (all labs ordered are listed, but only abnormal results are displayed) Labs Reviewed  CBC WITH DIFFERENTIAL/PLATELET - Abnormal; Notable for the following components:      Result Value   WBC 12.0 (*)    Platelets 135 (*)    Neutro Abs 9.7 (*)    Abs Immature Granulocytes 0.09 (*)    All other components within normal limits  COMPREHENSIVE METABOLIC PANEL WITH GFR - Abnormal; Notable for the following components:   Glucose, Bld 187 (*)    Creatinine, Ser 1.11 (*)    GFR, Estimated 52 (*)    All  other components within normal limits  SALICYLATE LEVEL - Abnormal; Notable for the following components:   Salicylate Lvl <7.0 (*)    All other components within normal limits  ACETAMINOPHEN LEVEL - Abnormal; Notable for the following components:   Acetaminophen (Tylenol), Serum <10 (*)    All other components within normal limits  URINALYSIS, W/ REFLEX TO CULTURE (INFECTION SUSPECTED) - Abnormal; Notable for the following components:   Color, Urine YELLOW (*)    APPearance CLOUDY (*)    Protein, ur 30 (*)    Leukocytes,Ua MODERATE (*)    Bacteria, UA RARE (*)    All other components within normal limits  URINE DRUG SCREEN, QUALITATIVE ([facility] ONLY) - Abnormal; Notable for the following components:   Tricyclic, Ur Screen POSITIVE (*)    All other components within normal limits  RESP PANEL BY RT-PCR (RSV, FLU A&B, COVID)  RVPGX2  URINE CULTURE  MRSA NEXT GEN BY PCR, NASAL  ETHANOL  AMMONIA  CK   LACTIC ACID, PLASMA  LACTIC ACID, PLASMA     EKG    RADIOLOGY Independently interpreted CT head with no acute pathology   PROCEDURES:  Critical Care performed: No  Procedures   MEDICATIONS ORDERED IN ED: Medications  rosuvastatin (CRESTOR) tablet 20 mg (has no administration in time range)  levothyroxine (SYNTHROID) tablet 75 mcg (has no administration in time range)   stroke: early stages of recovery book (has no administration in time range)  acetaminophen (TYLENOL) tablet 650 mg (has no administration in time range)    Or  acetaminophen (TYLENOL) 160 MG/5ML solution 650 mg (has no administration in time range)    Or  acetaminophen (TYLENOL) suppository 650 mg (has no administration in time range)  senna-docusate (Senokot-S) tablet 1 tablet (has no administration in time range)  enoxaparin (LOVENOX) injection 40 mg (has no administration in time range)  insulin aspart (novoLOG) injection 0-15 Units (has no administration in time range)  insulin aspart (novoLOG) injection 0-5 Units (has no administration in time range)  cefTRIAXone (ROCEPHIN) 2 g in sodium chloride 0.9 % 100 mL IVPB (has no administration in time range)  LORazepam (ATIVAN) injection 0.5 mg (has no administration in time range)  ondansetron (ZOFRAN) injection 4 mg (4 mg Intravenous Given 02/18/24 1206)  cefTRIAXone (ROCEPHIN) 2 g in sodium chloride 0.9 % 100 mL IVPB (0 g Intravenous Stopped 02/18/24 1414)     IMPRESSION / MDM / ASSESSMENT AND PLAN / ED COURSE  I reviewed the triage vital signs and the nursing notes.                              Differential diagnosis includes, but is not limited to, CVA, ICH, UTI, viral URI, polypharmacy, dehydration  Patient's presentation is most consistent with acute presentation with potential threat to life or bodily function.  Patient is a 78 year old female presenting today for acute altered mental status.  On arrival she is able to state her name when asking  questions but otherwise cannot give any additional history.  She responds with her name to each question.  She moves both upper extremities with no obvious deficits but unable to contribute on exam to fully tell one-sided weakness over the other.  Outside the window for TNK with last known well at 11 PM last night.  No obvious LVO seen to call stroke alert does not meet van criteria.  Vital signs otherwise stable.  Laboratory workup most notable  for UTI.  Negative for COVID, flu, RSV.  Slight leukocytosis at 12.  Normal CMP.  Otherwise negative salicylate, ethanol, acetaminophen, CK, ammonia.  CT head negative.  Suspect UTI is most likely source of her altered mental status.  Patient admitted to hospitalist for further care.  The patient is on the cardiac monitor to evaluate for evidence of arrhythmia and/or significant heart rate changes.     FINAL CLINICAL IMPRESSION(S) / ED DIAGNOSES   Final diagnoses:  Altered mental status, unspecified altered mental status type  Acute cystitis with hematuria     Rx / DC Orders   ED Discharge Orders     None        Note:  This document was prepared using Dragon voice recognition software and may include unintentional dictation errors.   Kandee Orion, MD 02/18/24 1515

## 2024-02-18 NOTE — Assessment & Plan Note (Addendum)
 Insulin SSI with at bedtime coverage ordered Patient takes Semglee 50 units twice daily, which was resumed on admission

## 2024-02-18 NOTE — ED Triage Notes (Signed)
 Coming from home LKW 11 pm yesterday. Found on ground, incontinent, unable to speak or move extremities. Normally A&OX4. EMS reports pt is becoming more alert able to respond with name but nothing else. LVO of 3. Pt is trying to move but unable.

## 2024-02-18 NOTE — Assessment & Plan Note (Signed)
Contact precaution

## 2024-02-18 NOTE — Assessment & Plan Note (Signed)
Home levothyroxine 75 mcg daily before breakfast resumed

## 2024-02-19 DIAGNOSIS — Z22322 Carrier or suspected carrier of Methicillin resistant Staphylococcus aureus: Secondary | ICD-10-CM

## 2024-02-19 DIAGNOSIS — I5032 Chronic diastolic (congestive) heart failure: Secondary | ICD-10-CM | POA: Diagnosis present

## 2024-02-19 DIAGNOSIS — T50915A Adverse effect of multiple unspecified drugs, medicaments and biological substances, initial encounter: Secondary | ICD-10-CM | POA: Diagnosis present

## 2024-02-19 DIAGNOSIS — G928 Other toxic encephalopathy: Secondary | ICD-10-CM | POA: Diagnosis present

## 2024-02-19 DIAGNOSIS — Z79899 Other long term (current) drug therapy: Secondary | ICD-10-CM | POA: Diagnosis not present

## 2024-02-19 DIAGNOSIS — J9809 Other diseases of bronchus, not elsewhere classified: Secondary | ICD-10-CM | POA: Diagnosis present

## 2024-02-19 DIAGNOSIS — F411 Generalized anxiety disorder: Secondary | ICD-10-CM | POA: Diagnosis not present

## 2024-02-19 DIAGNOSIS — R1312 Dysphagia, oropharyngeal phase: Secondary | ICD-10-CM | POA: Diagnosis not present

## 2024-02-19 DIAGNOSIS — E1122 Type 2 diabetes mellitus with diabetic chronic kidney disease: Secondary | ICD-10-CM | POA: Diagnosis present

## 2024-02-19 DIAGNOSIS — I10 Essential (primary) hypertension: Secondary | ICD-10-CM | POA: Diagnosis not present

## 2024-02-19 DIAGNOSIS — I2510 Atherosclerotic heart disease of native coronary artery without angina pectoris: Secondary | ICD-10-CM | POA: Diagnosis present

## 2024-02-19 DIAGNOSIS — M48061 Spinal stenosis, lumbar region without neurogenic claudication: Secondary | ICD-10-CM

## 2024-02-19 DIAGNOSIS — R4182 Altered mental status, unspecified: Secondary | ICD-10-CM | POA: Diagnosis present

## 2024-02-19 DIAGNOSIS — E039 Hypothyroidism, unspecified: Secondary | ICD-10-CM

## 2024-02-19 DIAGNOSIS — N3001 Acute cystitis with hematuria: Secondary | ICD-10-CM | POA: Diagnosis present

## 2024-02-19 DIAGNOSIS — M810 Age-related osteoporosis without current pathological fracture: Secondary | ICD-10-CM | POA: Diagnosis present

## 2024-02-19 DIAGNOSIS — Z794 Long term (current) use of insulin: Secondary | ICD-10-CM

## 2024-02-19 DIAGNOSIS — E119 Type 2 diabetes mellitus without complications: Secondary | ICD-10-CM | POA: Diagnosis not present

## 2024-02-19 DIAGNOSIS — E785 Hyperlipidemia, unspecified: Secondary | ICD-10-CM | POA: Diagnosis present

## 2024-02-19 DIAGNOSIS — Z1152 Encounter for screening for COVID-19: Secondary | ICD-10-CM | POA: Diagnosis not present

## 2024-02-19 DIAGNOSIS — G4733 Obstructive sleep apnea (adult) (pediatric): Secondary | ICD-10-CM | POA: Diagnosis present

## 2024-02-19 DIAGNOSIS — G9340 Encephalopathy, unspecified: Secondary | ICD-10-CM | POA: Diagnosis present

## 2024-02-19 DIAGNOSIS — F339 Major depressive disorder, recurrent, unspecified: Secondary | ICD-10-CM

## 2024-02-19 DIAGNOSIS — I130 Hypertensive heart and chronic kidney disease with heart failure and stage 1 through stage 4 chronic kidney disease, or unspecified chronic kidney disease: Secondary | ICD-10-CM | POA: Diagnosis present

## 2024-02-19 DIAGNOSIS — G894 Chronic pain syndrome: Secondary | ICD-10-CM | POA: Diagnosis present

## 2024-02-19 DIAGNOSIS — N1831 Chronic kidney disease, stage 3a: Secondary | ICD-10-CM | POA: Diagnosis present

## 2024-02-19 DIAGNOSIS — J398 Other specified diseases of upper respiratory tract: Secondary | ICD-10-CM

## 2024-02-19 DIAGNOSIS — K219 Gastro-esophageal reflux disease without esophagitis: Secondary | ICD-10-CM | POA: Diagnosis present

## 2024-02-19 DIAGNOSIS — D696 Thrombocytopenia, unspecified: Secondary | ICD-10-CM | POA: Diagnosis present

## 2024-02-19 DIAGNOSIS — Z6841 Body Mass Index (BMI) 40.0 and over, adult: Secondary | ICD-10-CM | POA: Diagnosis not present

## 2024-02-19 DIAGNOSIS — E538 Deficiency of other specified B group vitamins: Secondary | ICD-10-CM | POA: Diagnosis present

## 2024-02-19 DIAGNOSIS — Z79890 Hormone replacement therapy (postmenopausal): Secondary | ICD-10-CM | POA: Diagnosis not present

## 2024-02-19 LAB — CBC WITH DIFFERENTIAL/PLATELET
Abs Immature Granulocytes: 0.03 10*3/uL (ref 0.00–0.07)
Basophils Absolute: 0.1 10*3/uL (ref 0.0–0.1)
Basophils Relative: 1 %
Eosinophils Absolute: 0.1 10*3/uL (ref 0.0–0.5)
Eosinophils Relative: 1 %
HCT: 41.2 % (ref 36.0–46.0)
Hemoglobin: 13.6 g/dL (ref 12.0–15.0)
Immature Granulocytes: 0 %
Lymphocytes Relative: 16 %
Lymphs Abs: 1.5 10*3/uL (ref 0.7–4.0)
MCH: 30.2 pg (ref 26.0–34.0)
MCHC: 33 g/dL (ref 30.0–36.0)
MCV: 91.4 fL (ref 80.0–100.0)
Monocytes Absolute: 0.5 10*3/uL (ref 0.1–1.0)
Monocytes Relative: 6 %
Neutro Abs: 6.9 10*3/uL (ref 1.7–7.7)
Neutrophils Relative %: 76 %
Platelets: 139 10*3/uL — ABNORMAL LOW (ref 150–400)
RBC: 4.51 MIL/uL (ref 3.87–5.11)
RDW: 13.7 % (ref 11.5–15.5)
WBC: 9 10*3/uL (ref 4.0–10.5)
nRBC: 0 % (ref 0.0–0.2)

## 2024-02-19 LAB — LIPID PANEL
Cholesterol: 252 mg/dL — ABNORMAL HIGH (ref 0–200)
HDL: 32 mg/dL — ABNORMAL LOW (ref >40)
LDL Cholesterol: UNDETERMINED mg/dL (ref 0–99)
Total CHOL/HDL Ratio: 7.9 ratio
Triglycerides: 406 mg/dL — ABNORMAL HIGH (ref <150)
VLDL: UNDETERMINED mg/dL (ref 0–40)

## 2024-02-19 LAB — GLUCOSE, CAPILLARY
Glucose-Capillary: 123 mg/dL — ABNORMAL HIGH (ref 70–99)
Glucose-Capillary: 128 mg/dL — ABNORMAL HIGH (ref 70–99)
Glucose-Capillary: 97 mg/dL (ref 70–99)
Glucose-Capillary: 143 mg/dL — ABNORMAL HIGH (ref 70–99)

## 2024-02-19 LAB — COMPREHENSIVE METABOLIC PANEL WITH GFR
ALT: 24 U/L (ref 0–44)
AST: 29 U/L (ref 15–41)
Albumin: 3.7 g/dL (ref 3.5–5.0)
Alkaline Phosphatase: 58 U/L (ref 38–126)
Anion gap: 8 (ref 5–15)
BUN: 21 mg/dL (ref 8–23)
CO2: 24 mmol/L (ref 22–32)
Calcium: 9.2 mg/dL (ref 8.9–10.3)
Chloride: 110 mmol/L (ref 98–111)
Creatinine, Ser: 0.99 mg/dL (ref 0.44–1.00)
GFR, Estimated: 59 mL/min — ABNORMAL LOW (ref >60)
Glucose, Bld: 135 mg/dL — ABNORMAL HIGH (ref 70–99)
Potassium: 3.4 mmol/L — ABNORMAL LOW (ref 3.5–5.1)
Sodium: 142 mmol/L (ref 135–145)
Total Bilirubin: 0.8 mg/dL (ref 0.0–1.2)
Total Protein: 7.1 g/dL (ref 6.5–8.1)

## 2024-02-19 LAB — HIV ANTIBODY (ROUTINE TESTING W REFLEX): HIV Screen 4th Generation wRfx: NONREACTIVE

## 2024-02-19 LAB — TSH: TSH: 1.31 u[IU]/mL (ref 0.350–4.500)

## 2024-02-19 LAB — URINE CULTURE

## 2024-02-19 LAB — RPR: RPR Ser Ql: NONREACTIVE

## 2024-02-19 LAB — VITAMIN B12: Vitamin B-12: 844 pg/mL (ref 180–914)

## 2024-02-19 LAB — LDL CHOLESTEROL, DIRECT: Direct LDL: 159 mg/dL — ABNORMAL HIGH (ref 0–99)

## 2024-02-19 NOTE — Plan of Care (Signed)
  Problem: Ischemic Stroke/TIA Tissue Perfusion: Goal: Complications of ischemic stroke/TIA will be minimized Outcome: Progressing   Problem: Education: Goal: Knowledge of disease or condition will improve Outcome: Progressing   Problem: Coping: Goal: Will verbalize positive feelings about self Outcome: Progressing Goal: Will identify appropriate support needs Outcome: Progressing   Problem: Health Behavior/Discharge Planning: Goal: Ability to manage health-related needs will improve Outcome: Progressing Goal: Goals will be collaboratively established with patient/family Outcome: Progressing   Problem: Self-Care: Goal: Ability to participate in self-care as condition permits will improve Outcome: Progressing Goal: Verbalization of feelings and concerns over difficulty with self-care will improve Outcome: Progressing Goal: Ability to communicate needs accurately will improve Outcome: Progressing

## 2024-02-19 NOTE — Evaluation (Signed)
 Clinical/Bedside Swallow Evaluation Patient Details  Name: [name] MRN: [id number] Date of Birth: [birth year]  Today's Date: 02/19/2024 Time: SLP Start Time (ACUTE ONLY): 1100 SLP Stop Time (ACUTE ONLY): 1120 SLP Time Calculation (min) (ACUTE ONLY): 20 min  Past Medical History:  Past Medical History:  Diagnosis Date   Amnesia    Anxiety    Arthritis    Back pain    CHF (congestive heart failure) (HCC)    Chronic kidney disease    Acute on chronic 10/2020   Chronic pain syndrome    Cystocele    Depression    Diabetes mellitus without complication (HCC)    Type II   GERD (gastroesophageal reflux disease)    Hyperlipidemia    Hypertension    IBS (irritable bowel syndrome)    Obesity    Osteoporosis    Panic attacks    Seizure (HCC)    Shortness of breath dyspnea    Sleep apnea    not using now   Thyroid nodule    Past Surgical History:  Past Surgical History:  Procedure Laterality Date   ABDOMINAL HYSTERECTOMY     APPENDECTOMY     BACK SURGERY     CARDIAC CATHETERIZATION     CHOLECYSTECTOMY     CORONARY STENT INTERVENTION N/A 05/05/2018   Procedure: CORONARY STENT INTERVENTION;  Surgeon: Marcina Millard, MD;  Location: [facility] INVASIVE CV LAB;  Service: Cardiovascular;  Laterality: N/A;   FOOT SURGERY     LEFT HEART CATH AND CORONARY ANGIOGRAPHY Left 05/05/2018   Procedure: LEFT HEART CATH AND CORONARY ANGIOGRAPHY;  Surgeon: Marcina Millard, MD;  Location: [facility] INVASIVE CV LAB;  Service: Cardiovascular;  Laterality: Left;   OOPHORECTOMY     REPLACEMENT TOTAL KNEE     TONSILLECTOMY     HPI:  Ms. [name] is a 78 year old female with history of hypertension, hyperlipidemia, CKD 3A, depression, anxiety, obstructive sleep apnea, CAD status post PCI to LAD, hypothyroid, insulin-dependent diabetes mellitus, who presents emergency department from home for chief concerns of altered mentation. N.p.o. as patient was too altered for swallow screening attempt  upon admission/overnight. Chest x-ray on Admission: Negative for acute cardiopulmonary process. MRI 02/18/24: No evidence of acute intracranial abnormality.    Assessment / Plan / Recommendation  Clinical Impression  Pt seen for bedside swallow assessment in the setting of altered mental status. Per chart review, Family Medicine MD reported pt "reports that she gets choked while eating.  Offered her referral for swallowing study but she declined.  Discussed eating smaller bites and not talking while she eats," on 02/12/2024. Pt has hx of GERD.       Today, pt alert- oriented to self, and verbally perseverative and frustrated regarding acute memory loss. Pt redirected with min verbal cues and reduction of distractions. Pt following simple commands with visual/verbal cues for duration of session. Pt seen with trials of thin liquids (via straw), puree, and regular solids. No overt or subtle s/sx pharyngeal dysphagia noted. No change to vocal quality across trials. Oral phase grossly intact- with complete manipulation and clearance of regular solid from oral cavity. Pt independent with small bites/sips and slow rate. Pt denied use of straw at baseline.       Based on age and current debility/AMS, pt is at increased risk of aspiration, therefore recommend aspiration precautions (slow rate, small bites, elevated HOB, and alert for PO intake). Remain upright at least 30 min following intake given hx of GERD. Reduce distractions to aid  attention to task and assist with set up as needed. Recommend regular solids and thin liquids. MD and RN aware of recommendations. Regarding cognition, given negative brain imaging, improvement in mentation/alertness overnight, and current medical picture (including UTI)- suspect time/medical intervention will aid continued recovery. Please re-consult if needed.  SLP Visit Diagnosis: Dysphagia, unspecified (R13.10)    Aspiration Risk  Mild aspiration risk    Diet Recommendation    Age appropriate regular;Thin  Medication Administration: Whole meds with liquid    Other  Recommendations Oral Care Recommendations: Oral care BID;Staff/trained caregiver to provide oral care    Recommendations for follow up therapy are one component of a multi-disciplinary discharge planning process, led by the attending physician.  Recommendations may be updated based on patient status, additional functional criteria and insurance authorization.  Follow up Recommendations No SLP follow up      Assistance Recommended at Discharge  Intermittent supervision for PO intake  Functional Status Assessment Patient has not had a recent decline in their functional status (in regards to oropharyngeal function)    Swallow Study   General Date of Onset: 02/19/24 HPI: Ms. [name] is a 78 year old female with history of hypertension, hyperlipidemia, CKD 3A, depression, anxiety, obstructive sleep apnea, CAD status post PCI to LAD, hypothyroid, insulin-dependent diabetes mellitus, who presents emergency department from home for chief concerns of altered mentation. N.p.o. as patient was too altered for swallow screening attempt upon admission/overnight. Chest x-ray on Admission: Negative for acute cardiopulmonary process. MRI 02/18/24: No evidence of acute intracranial abnormality. Type of Study: Bedside Swallow Evaluation Previous Swallow Assessment: none in chart Diet Prior to this Study: NPO Temperature Spikes Noted: No (98.4; WBC 9.0) Respiratory Status: Room air History of Recent Intubation: No Behavior/Cognition: Alert;Confused;Cooperative Oral Cavity Assessment: Dry (lips dry, white coating on tongue- denied soreness) Oral Care Completed by SLP: Yes Oral Cavity - Dentition: Adequate natural dentition Vision: Functional for self-feeding Self-Feeding Abilities: Able to feed self;Needs set up Patient Positioning: Upright in bed Baseline Vocal Quality: Normal Volitional Cough: Cognitively  unable to elicit Volitional Swallow: Unable to elicit    Oral/Motor/Sensory Function Overall Oral Motor/Sensory Function: Within functional limits   Ice Chips Ice chips: Within functional limits   Thin Liquid Thin Liquid: Within functional limits Presentation: Cup;Self Fed    Nectar Thick Nectar Thick Liquid: Not tested   Honey Thick Honey Thick Liquid: Not tested   Puree Puree: Within functional limits Presentation: Spoon;Self Fed   Solid     Solid: Within functional limits Presentation: Self Fed     [place] Etienne Mowers Clapp, MS, CCC-SLP Speech Language Pathologist Rehab Services; [facility] [place] [phone] (ascom)   [place] J Clapp 02/19/2024,12:33 PM

## 2024-02-19 NOTE — Evaluation (Signed)
 Occupational Therapy Evaluation Patient Details Name: [name] MRN: [id number] DOB: Nov 18, 1945 Today's Date: 02/19/2024   History of Present Illness   Pt is a 78 year old female who presents to [facility] from home for chief concerns of altered mentation found on the ground, diagnosed with UTI, MRSA. MRI negative. CT cervical spine: Chronic T2 compression fracture. PMH of hypertension, hyperlipidemia, CKD 3A, depression, anxiety, obstructive sleep apnea, CAD status post PCI to LAD, hypothyroid, insulin-dependent diabetes mellitus.     Clinical Impressions Pt was seen for PT/OT co-evaluation this date. Pt is a poor historian, in for the above diagnoses with significant AMS and inability to provide accurate history. Chart review in 2023 stated she lived in an apartment with a roommate and grandson, ambulated with a rollator and was IND with ADLs. Pt now reporting she has a caregiver who lives with her, but unsure if this is 24/7 or intermittent.  Pt presents to acute OT demonstrating impaired ADL performance and functional mobility 2/2 cognitive deficits, limited safety awareness and mild weakness. Pt currently requires SUP and increased time for bed mobility. CGA for STS and mobility to the bathroom and back. CGA for toilet transfer and LB bathing/peri-care in standing at toilet. Min A for doffing soiled gown and donning new gown. Increased cueing throughout for safety as pt is impulsive, needs assist to perform hand hygiene at sink. Perseverates and repeats herself a lot throughout session. Would need 24/7 assist to return home safely d/t her cognitive deficits at this time. Unable to reach daughter to determine true baseline. Pt would benefit from skilled OT services to address noted impairments and functional limitations to maximize safety and independence while minimizing falls risk and caregiver burden. Do anticipate the need for follow up OT services upon acute hospital DC.      If plan is  discharge home, recommend the following:   A little help with walking and/or transfers;A little help with bathing/dressing/bathroom;Assistance with cooking/housework;Help with stairs or ramp for entrance     Functional Status Assessment   Patient has had a recent decline in their functional status and demonstrates the ability to make significant improvements in function in a reasonable and predictable amount of time.     Equipment Recommendations   Other (comment) (defer)     Recommendations for Other Services         Precautions/Restrictions   Precautions Precautions: Fall Recall of Precautions/Restrictions: Impaired Restrictions Weight Bearing Restrictions Per Provider Order: No     Mobility Bed Mobility Overal bed mobility: Needs Assistance Bed Mobility: Supine to Sit, Sit to Supine     Supine to sit: Supervision Sit to supine: Supervision   General bed mobility comments: increased effort/time    Transfers Overall transfer level: Needs assistance Equipment used: Rolling walker (2 wheels) Transfers: Sit to/from Stand Sit to Stand: Contact guard assist           General transfer comment: cueing for safety d/t impulsivity      Balance Overall balance assessment: Needs assistance Sitting-balance support: Feet supported Sitting balance-Leahy Scale: Good     Standing balance support: Reliant on assistive device for balance, Single extremity supported, During functional activity Standing balance-Leahy Scale: Fair Standing balance comment: no LOB while bathing self/peri-care in standing at RW; CGA for safety                           ADL either performed or assessed with clinical judgement   ADL Overall  ADL's : Needs assistance/impaired     Grooming: Wash/dry hands;Standing;Contact guard assist   Upper Body Bathing: Supervision/ safety;Sitting   Lower Body Bathing: Sit to/from stand;Contact guard assist   Upper Body Dressing :  Minimal assistance;Sitting       Toilet Transfer: Contact guard assist;Rolling walker (2 wheels)   Toileting- Clothing Manipulation and Hygiene: Supervision/safety;Sitting/lateral lean       Functional mobility during ADLs: Contact guard assist;Rolling walker (2 wheels)       Vision         Perception         Praxis         Pertinent Vitals/Pain Pain Assessment Pain Assessment: No/denies pain     Extremity/Trunk Assessment Upper Extremity Assessment Upper Extremity Assessment: Overall WFL for tasks assessed   Lower Extremity Assessment Lower Extremity Assessment: Generalized weakness       Communication Communication Communication: No apparent difficulties   Cognition Arousal: Alert Behavior During Therapy: Impulsive Cognition: Cognition impaired   Orientation impairments: Place, Time, Situation Awareness: Online awareness impaired       OT - Cognition Comments: states she is in a hospital, knows it is April, believes it is 2020, unsure why she is here; perseverates and repeats self mult times                 Following commands: Impaired Following commands impaired: Follows one step commands with increased time     Cueing  General Comments   Cueing Techniques: Verbal cues;Tactile cues  HR up to 149 during session and drop to 112 with return to bed   Exercises Other Exercises Other Exercises: Edu on role of OT in acute setting.   Shoulder Instructions      Home Living Family/patient expects to be discharged to:: Private residence Living Arrangements: Non-relatives/Friends Available Help at Discharge: Personal care attendant (pt reports a PCA? poor historian) Type of Home: Apartment                                  Prior Functioning/Environment Prior Level of Function : Patient poor historian/Family not available             Mobility Comments: reports she uses a rollator, however pt is a poor historian ADLs  Comments: unsure-chart review in 2023 says IND with ADLs and sponge bathes at baseline; pt is poor historian    OT Problem List: Decreased strength;Decreased cognition;Decreased activity tolerance;Decreased safety awareness   OT Treatment/Interventions: Self-care/ADL training;Balance training;Therapeutic exercise;Therapeutic activities;Patient/family education      OT Goals(Current goals can be found in the care plan section)   Acute Rehab OT Goals OT Goal Formulation: Patient unable to participate in goal setting Time For Goal Achievement: 03/04/24 Potential to Achieve Goals: Fair ADL Goals Pt Will Perform Lower Body Bathing: with modified independence;with supervision;sitting/lateral leans;sit to/from stand Pt Will Perform Lower Body Dressing: with modified independence;with supervision;sit to/from stand;sitting/lateral leans Pt Will Transfer to Toilet: with supervision;with modified independence;ambulating;regular height toilet Additional ADL Goal #1: Pt will demo fall prevention and safety awareness techniques during ADL performance and transfers 2/2 trials.   OT Frequency:  Min 2X/week    Co-evaluation PT/OT/SLP Co-Evaluation/Treatment: Yes Reason for Co-Treatment: Necessary to address cognition/behavior during functional activity;For patient/therapist safety PT goals addressed during session: Mobility/safety with mobility OT goals addressed during session: ADL's and self-care      AM-PAC OT "6 Clicks" Daily Activity  Outcome Measure Help from another person eating meals?: None Help from another person taking care of personal grooming?: A Little Help from another person toileting, which includes using toliet, bedpan, or urinal?: A Little Help from another person bathing (including washing, rinsing, drying)?: A Little Help from another person to put on and taking off regular upper body clothing?: A Little Help from another person to put on and taking off regular lower  body clothing?: A Little 6 Click Score: 19   End of Session Equipment Utilized During Treatment: Rolling walker (2 wheels);Gait belt Nurse Communication: Mobility status  Activity Tolerance: Patient tolerated treatment well Patient left: in bed;with call bell/phone within reach;with bed alarm set  OT Visit Diagnosis: Other abnormalities of gait and mobility (R26.89);Muscle weakness (generalized) (M62.81)                Time: 1610-9604 OT Time Calculation (min): 30 min Charges:  OT General Charges $OT Visit: 1 Visit OT Evaluation $OT Eval Moderate Complexity: 1 Mod Tenicia Gural, OTR/L 02/19/24, 1:06 PM  Makynzie Dobesh E Andrick Rust 02/19/2024, 1:00 PM

## 2024-02-19 NOTE — Evaluation (Signed)
 Physical Therapy Evaluation Patient Details Name: [name] MRN: [id number] DOB: 05-25-1946 Today's Date: 02/19/2024  History of Present Illness  Pt is a 78 year old female who presents to [facility] from home for chief concerns of altered mentation found on the ground, diagnosed with UTI, MRSA. MRI negative. CT cervical spine: Chronic T2 compression fracture. PMH of hypertension, hyperlipidemia, CKD 3A, depression, anxiety, obstructive sleep apnea, CAD status post PCI to LAD, hypothyroid, insulin-dependent diabetes mellitus.  Clinical Impression  PT/OT co-evaluation performed this date. Pt is a pleasant 78 year old female who was admitted for AMS. Pt performs bed mobility with supervision, transfers with cga, and ambulation with cga and RW. +2 needed this date due to confusion/impulsiveness. Pt demonstrates deficits with B LEs. Pt self reports chronic neuropathy in B UE/LEs. Currently isn't safe to mobilize without assistance. Would benefit from skilled PT to address above deficits and promote optimal return to PLOF. Pt will continue to receive skilled PT services while admitted and will defer to TOC/care team for updates regarding disposition planning.        If plan is discharge home, recommend the following: A little help with walking and/or transfers;A little help with bathing/dressing/bathroom;Help with stairs or ramp for entrance;Supervision due to cognitive status   Can travel by private vehicle        Equipment Recommendations  (TBD)  Recommendations for Other Services       Functional Status Assessment Patient has had a recent decline in their functional status and demonstrates the ability to make significant improvements in function in a reasonable and predictable amount of time.     Precautions / Restrictions Precautions Precautions: Fall Recall of Precautions/Restrictions: Impaired Restrictions Weight Bearing Restrictions Per Provider Order: No      Mobility  Bed  Mobility Overal bed mobility: Needs Assistance Bed Mobility: Sit to Supine       Sit to supine: Supervision   General bed mobility comments: follows commands inconsistently    Transfers Overall transfer level: Needs assistance Equipment used: Rolling walker (2 wheels) Transfers: Sit to/from Stand Sit to Stand: Contact guard assist           General transfer comment: impulsive. Needs cues for hand placement and use of AD    Ambulation/Gait Ambulation/Gait assistance: Contact guard assist Gait Distance (Feet): 40 Feet Assistive device: Rolling walker (2 wheels) Gait Pattern/deviations: Step-through pattern       General Gait Details: ambulated in room with CGA +2 and RW. Needs cues for RW sequencing and staying close to walker. Due to confusion, not appropriate for ambulating further distances  Stairs            Wheelchair Mobility     Tilt Bed    Modified Rankin (Stroke Patients Only)       Balance Overall balance assessment: Needs assistance Sitting-balance support: Feet supported Sitting balance-Leahy Scale: Good     Standing balance support: Reliant on assistive device for balance, Single extremity supported, During functional activity Standing balance-Leahy Scale: Fair                               Pertinent Vitals/Pain Pain Assessment Pain Assessment: No/denies pain    Home Living Family/patient expects to be discharged to:: Private residence Living Arrangements: Non-relatives/Friends Available Help at Discharge: Personal care attendant (pt reports a PCA? poor historian) Type of Home: Apartment           Home Equipment: Rollator (4  wheels) Additional Comments: pt is poor historian. Chart reviewed- pt telling MD last week that she lives with daughter and S/O. Previous chart said she lives with her grandson. Pt is saying today that she has a 24/7 caregiver who is related to whom she lives with    Prior Function Prior Level  of Function : Patient poor historian/Family not available             Mobility Comments: reports she uses a rollator, however pt is a poor historian ADLs Comments: unsure-chart review in 2023 says IND with ADLs and sponge bathes at baseline; pt is poor historian     Extremity/Trunk Assessment   Upper Extremity Assessment Upper Extremity Assessment: Overall WFL for tasks assessed (reports chronic neuropathy)    Lower Extremity Assessment Lower Extremity Assessment: Generalized weakness (B LE grossly 4/5; decreased endurance during functional task- reports chronic neuropathy)       Communication   Communication Communication: No apparent difficulties    Cognition Arousal: Alert Behavior During Therapy: Impulsive   PT - Cognitive impairments: Difficult to assess                       PT - Cognition Comments: pt aware that she feels confused. Repeats self constantly and jumbles words. She is inconsistently oriented x 2 Following commands: Impaired Following commands impaired: Follows one step commands with increased time     Cueing Cueing Techniques: Verbal cues, Tactile cues     General Comments General comments (skin integrity, edema, etc.): HR up to 140bpm with exertion and with rest decreases to 112bpm    Exercises Other Exercises Other Exercises: able to ambulate to bathroom with CGA +2 and perform peri care and partial bath with OT. This Clinical research associate assisted for transfers   Assessment/Plan    PT Assessment Patient needs continued PT services  PT Problem List Decreased strength;Decreased activity tolerance;Decreased balance;Decreased mobility;Decreased cognition;Decreased safety awareness;Impaired sensation       PT Treatment Interventions Gait training;Therapeutic activities;Therapeutic exercise;Balance training    PT Goals (Current goals can be found in the Care Plan section)  Acute Rehab PT Goals Patient Stated Goal: to have a family member visit her  in hospital PT Goal Formulation: With patient Time For Goal Achievement: 03/04/24 Potential to Achieve Goals: Good    Frequency Min 2X/week     Co-evaluation PT/OT/SLP Co-Evaluation/Treatment: Yes Reason for Co-Treatment: Necessary to address cognition/behavior during functional activity;For patient/therapist safety PT goals addressed during session: Mobility/safety with mobility OT goals addressed during session: ADL's and self-care       AM-PAC PT "6 Clicks" Mobility  Outcome Measure Help needed turning from your back to your side while in a flat bed without using bedrails?: None Help needed moving from lying on your back to sitting on the side of a flat bed without using bedrails?: A Little Help needed moving to and from a bed to a chair (including a wheelchair)?: A Little Help needed standing up from a chair using your arms (e.g., wheelchair or bedside chair)?: A Little Help needed to walk in hospital room?: A Little Help needed climbing 3-5 steps with a railing? : A Lot 6 Click Score: 18    End of Session Equipment Utilized During Treatment: Gait belt Activity Tolerance: Patient tolerated treatment well Patient left: in bed;with bed alarm set Nurse Communication: Mobility status PT Visit Diagnosis: Unsteadiness on feet (R26.81);Muscle weakness (generalized) (M62.81);History of falling (Z91.81);Difficulty in walking, not elsewhere classified (R26.2)  Time: 4010-2725 PT Time Calculation (min) (ACUTE ONLY): 30 min   Charges:   PT Evaluation $PT Eval Low Complexity: 1 Low   PT General Charges $$ ACUTE PT VISIT: 1 Visit         Amparo Balk, PT, DPT, GCS [phone]   Shaurya Rawdon 02/19/2024, 1:35 PM

## 2024-02-19 NOTE — Care Management Obs Status (Signed)
 MEDICARE OBSERVATION STATUS NOTIFICATION   Patient Details  Name: [name] MRN: [id number] Date of Birth: 10-Dec-1945   Medicare Observation Status Notification Given:  Rudolph Cost, CMA 02/19/2024, 10:14 AM
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
## 2024-02-19 NOTE — Plan of Care (Signed)
   Problem: Fluid Volume: Goal: Ability to maintain a balanced intake and output will improve Outcome: Progressing   Problem: Skin Integrity: Goal: Risk for impaired skin integrity will decrease Outcome: Progressing   Problem: Tissue Perfusion: Goal: Adequacy of tissue perfusion will improve Outcome: Progressing

## 2024-02-20 ENCOUNTER — Other Ambulatory Visit: Payer: Self-pay

## 2024-02-20 ENCOUNTER — Encounter: Payer: Self-pay | Admitting: Oncology

## 2024-02-20 DIAGNOSIS — F411 Generalized anxiety disorder: Secondary | ICD-10-CM | POA: Diagnosis not present

## 2024-02-20 DIAGNOSIS — I10 Essential (primary) hypertension: Secondary | ICD-10-CM | POA: Diagnosis not present

## 2024-02-20 DIAGNOSIS — E119 Type 2 diabetes mellitus without complications: Secondary | ICD-10-CM | POA: Diagnosis not present

## 2024-02-20 DIAGNOSIS — G928 Other toxic encephalopathy: Secondary | ICD-10-CM | POA: Diagnosis not present

## 2024-02-20 DIAGNOSIS — F321 Major depressive disorder, single episode, moderate: Secondary | ICD-10-CM

## 2024-02-20 DIAGNOSIS — Z789 Other specified health status: Secondary | ICD-10-CM

## 2024-02-20 LAB — MAGNESIUM: Magnesium: 1.9 mg/dL (ref 1.7–2.4)

## 2024-02-20 LAB — COMPREHENSIVE METABOLIC PANEL WITH GFR
ALT: 20 U/L (ref 0–44)
AST: 25 U/L (ref 15–41)
Albumin: 3.4 g/dL — ABNORMAL LOW (ref 3.5–5.0)
Alkaline Phosphatase: 57 U/L (ref 38–126)
Anion gap: 9 (ref 5–15)
BUN: 22 mg/dL (ref 8–23)
CO2: 23 mmol/L (ref 22–32)
Calcium: 8.7 mg/dL — ABNORMAL LOW (ref 8.9–10.3)
Chloride: 107 mmol/L (ref 98–111)
Creatinine, Ser: 0.88 mg/dL (ref 0.44–1.00)
GFR, Estimated: >60 mL/min (ref >60)
Glucose, Bld: 152 mg/dL — ABNORMAL HIGH (ref 70–99)
Potassium: 3 mmol/L — ABNORMAL LOW (ref 3.5–5.1)
Sodium: 139 mmol/L (ref 135–145)
Total Bilirubin: 0.7 mg/dL (ref 0.0–1.2)
Total Protein: 6.4 g/dL — ABNORMAL LOW (ref 6.5–8.1)

## 2024-02-20 LAB — CBC WITH DIFFERENTIAL/PLATELET
Abs Immature Granulocytes: 0.02 10*3/uL (ref 0.00–0.07)
Basophils Absolute: 0.1 10*3/uL (ref 0.0–0.1)
Basophils Relative: 1 %
Eosinophils Absolute: 0.1 10*3/uL (ref 0.0–0.5)
Eosinophils Relative: 2 %
HCT: 37.3 % (ref 36.0–46.0)
Hemoglobin: 12.6 g/dL (ref 12.0–15.0)
Immature Granulocytes: 0 %
Lymphocytes Relative: 31 %
Lymphs Abs: 2.4 10*3/uL (ref 0.7–4.0)
MCH: 29.9 pg (ref 26.0–34.0)
MCHC: 33.8 g/dL (ref 30.0–36.0)
MCV: 88.6 fL (ref 80.0–100.0)
Monocytes Absolute: 0.7 10*3/uL (ref 0.1–1.0)
Monocytes Relative: 10 %
Neutro Abs: 4.3 10*3/uL (ref 1.7–7.7)
Neutrophils Relative %: 56 %
Platelets: 124 10*3/uL — ABNORMAL LOW (ref 150–400)
RBC: 4.21 MIL/uL (ref 3.87–5.11)
RDW: 13.7 % (ref 11.5–15.5)
WBC: 7.6 10*3/uL (ref 4.0–10.5)
nRBC: 0 % (ref 0.0–0.2)

## 2024-02-20 LAB — PHOSPHORUS: Phosphorus: 2.7 mg/dL (ref 2.5–4.6)

## 2024-02-20 LAB — GLUCOSE, CAPILLARY
Glucose-Capillary: 164 mg/dL — ABNORMAL HIGH (ref 70–99)
Glucose-Capillary: 202 mg/dL — ABNORMAL HIGH (ref 70–99)

## 2024-02-20 MED ORDER — LORAZEPAM 1 MG PO TABS
1.0000 mg | ORAL_TABLET | Freq: Three times a day (TID) | ORAL | Status: DC | PRN
  Administered 2024-02-20: 1 mg via ORAL
  Filled 2024-02-20: qty 1

## 2024-02-20 MED ORDER — GABAPENTIN 400 MG PO CAPS
400.0000 mg | ORAL_CAPSULE | Freq: Three times a day (TID) | ORAL | 0 refills | Status: AC
  Filled 2024-02-20: qty 90, 30d supply, fill #0

## 2024-02-20 MED ORDER — POTASSIUM CHLORIDE CRYS ER 20 MEQ PO TBCR
40.0000 meq | EXTENDED_RELEASE_TABLET | Freq: Once | ORAL | Status: AC
  Administered 2024-02-20: 40 meq via ORAL
  Filled 2024-02-20: qty 2

## 2024-02-20 MED ORDER — ZOLPIDEM TARTRATE 10 MG PO TABS: 5.0000 mg | ORAL_TABLET | Freq: Every day | ORAL | Status: AC

## 2024-02-20 MED ORDER — OXYCODONE-ACETAMINOPHEN 10-325 MG PO TABS: 1.0000 | ORAL_TABLET | Freq: Four times a day (QID) | ORAL | Status: AC | PRN

## 2024-02-20 MED ORDER — NYSTATIN 100000 UNIT/GM EX POWD
Freq: Two times a day (BID) | CUTANEOUS | Status: DC
  Filled 2024-02-20: qty 15

## 2024-02-20 MED ORDER — LORAZEPAM 0.5 MG PO TABS: 0.5000 mg | ORAL_TABLET | Freq: Once | ORAL | Status: DC

## 2024-02-20 MED ORDER — METOPROLOL SUCCINATE ER 50 MG PO TB24
50.0000 mg | ORAL_TABLET | Freq: Every day | ORAL | Status: DC
  Administered 2024-02-20: 50 mg via ORAL
  Filled 2024-02-20: qty 1
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
## 2024-02-20 NOTE — TOC Transition Note (Signed)
 Transition of Care [facility]) - Discharge Note   Patient Details  Name: [name] MRN: [id number] Date of Birth: 05/17/1946  Transition of Care [facility]) CM/SW Contact:  Crayton Docker, RN 02/20/2024, 4:25 PM   Clinical Narrative:     Discharge orders noted. Discharge summary noted. CM to patient's room regarding therapy recommendations for SNF. Patient declined SNF and home health services. No voiced concerns. Private transportation arranged.  Final Next Level of Care: Home/self care   Patient Goals and CMS Choice      Home    Discharge Placement       Home/self care         Discharge Plan and Services Additional resources added to the After Visit Summary for     Discharge Planning Services: CM Consult        Social Drivers of Health (SDOH) Interventions SDOH Screenings   Food Insecurity: Patient Unable To Answer (02/18/2024)  Housing: Patient Unable To Answer (02/18/2024)  Transportation Needs: Patient Unable To Answer (02/18/2024)  Utilities: Patient Unable To Answer (02/18/2024)  Depression (PHQ2-9): High Risk (12/10/2023)  Financial Resource Strain: Low Risk  (01/01/2024)   [facility]  Social Connections: Patient Unable To Answer (02/18/2024)  Tobacco Use: Low Risk  (02/18/2024)     Readmission Risk Interventions     No data to display

## 2024-02-20 NOTE — Progress Notes (Signed)
 PT Cancellation Note  Patient Details Name: LAIANA FRATUS MRN: [id number] DOB: [birth year]   Cancelled Treatment:    Reason Eval/Treat Not Completed: Patient declined, no reason specified Patient agitated, wants to leave now. Declines working with PT. Send MD a message.  Glenyce Randle 02/20/2024, 10:11 AM

## 2024-02-20 NOTE — TOC Initial Note (Signed)
 Transition of Care [facility]) - Initial/Assessment Note    Patient Details  Name: [name] MRN: [id number] Date of Birth: 24-Oct-1946  Transition of Care [facility]) CM/SW Contact:    Dalia GORMAN Fuse, RN Phone Number: 02/20/2024, 11:55 AM  Clinical Narrative:                 TOC spoke with the patient's daughter [name] [phone] The patient is from home with a live in caregiver Victory as well as her daughter. The patient relies on Medicaid transportation for transportation and her daughter also drives when she has a vehicle. The patient has a walker at home and does not have any difficulty obtaining or taking medications when she is home. The patients preference is to return home.  TOC will continue to follow  Expected Discharge Plan: Home w Home Health Services Barriers to Discharge: Continued Medical Work up   Patient Goals and CMS Choice            Expected Discharge Plan and Services   Discharge Planning Services: CM Consult   Living arrangements for the past 2 months: Single Family Home                                      Prior Living Arrangements/Services Living arrangements for the past 2 months: Single Family Home Lives with:: Adult Children, Other (Comment) (Live-in caregiver Victory)              Current home services: DME (walker)    Activities of Daily Living      Permission Sought/Granted                  Emotional Assessment           Psych Involvement: No (comment)  Admission diagnosis:  Altered mental status [R41.82] Acute cystitis with hematuria [N30.01] Altered mental status, unspecified altered mental status type [R41.82] Encephalopathy [G93.40] Patient Active Problem List   Diagnosis Date Noted   Encephalopathy 02/19/2024   Altered mental status 02/18/2024   MRSA nasal colonization 02/18/2024   UTI (urinary tract infection) 02/18/2024   Swallowing difficulty 02/12/2024   Hypertension 12/10/2023    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   Chronic diastolic CHF (congestive heart failure) (HCC) 09/13/2023   CKD (chronic kidney disease) stage 3, GFR 30-59 ml/min (HCC) 09/13/2023   Insulin  dependent type 2 diabetes mellitus (HCC) 12/28/2021   B12 deficiency 05/11/2021   Hypothyroidism    Lumbar foraminal stenosis 02/09/2021   CPAP ventilation treatment not tolerated 02/06/2020   Morbid obesity with BMI of 45.0-49.9, adult (HCC) 02/06/2020   Polypharmacy 02/06/2020   CAD (coronary artery disease) 05/05/2018   Adrenal cortical nodule (HCC) LEFT, small 09/17/2016   Degenerative spondylolisthesis 01/02/2016   Essential hypertension    Incontinence of urine in female 01/25/2015   Non-toxic multinodular goiter 10/11/2014   HLD (hyperlipidemia) 10/11/2014   Depression 10/11/2014   Osteoporosis 10/11/2014   GERD (gastroesophageal reflux disease) 10/11/2014   GAD (generalized anxiety disorder) 04/10/2013   Major depressive disorder, recurrent episode (HCC) 04/10/2013   PCP:  Ziglar, Susan K, MD Pharmacy:   [facility] [address] [place] [address] [place] [address] Phone: [phone] Fax: [phone]     Social Drivers of Health (SDOH) Social History: SDOH Screenings   Food Insecurity: Patient Unable To Answer (02/18/2024)  Housing: Patient Unable To Answer (02/18/2024)  Transportation Needs:  Patient Unable To Answer (02/18/2024)  Utilities: Patient Unable To Answer (02/18/2024)  Depression (PHQ2-9): High Risk (12/10/2023)  Financial Resource Strain: Low Risk  (01/01/2024)   Received from [facility]  Social Connections: Patient Unable To Answer (02/18/2024)  Tobacco Use: Low Risk  (02/18/2024)   SDOH Interventions:     Readmission Risk Interventions     No data to display

## 2024-02-20 NOTE — Progress Notes (Signed)
 OT Cancellation Note  Patient Details Name: [name] MRN: [id number] DOB: [birth year]   Cancelled Treatment:    Reason Eval/Treat Not Completed: Other (comment). Entered room and pt very agitated stating I'm going home today, my daughter is here and I'm going home!! Attempted to calm pt and clarified level of assist at home and pt does have 24/7 caregiver named Victory who lives with pt and pt's daughter. PT sent message to MD about pt wanting to go home and nurse notified of agitation and request for nerve pill. Will cont to follow POC as appropriate.  Aquinnah Devin E Octavie Westerhold 02/20/2024, 11:20 AM

## 2024-02-20 NOTE — Plan of Care (Signed)
  Problem: Education: Goal: Ability to describe self-care measures that may prevent or decrease complications (Diabetes Survival Skills Education) will improve Outcome: Progressing   Problem: Coping: Goal: Ability to adjust to condition or change in h

## 2024-02-22 LAB — VITAMIN B1: Vitamin B1 (Thiamine): 149.6 nmol/L (ref 66.5–200.0)

## 2024-03-09 ENCOUNTER — Ambulatory Visit (INDEPENDENT_AMBULATORY_CARE_PROVIDER_SITE_OTHER): Admitting: Family Medicine

## 2024-03-09 ENCOUNTER — Encounter: Payer: Self-pay | Admitting: Family Medicine

## 2024-03-09 VITALS — BP 107/73 | HR 98 | Temp 97.8°F | Resp 18 | Ht 64.0 in | Wt 238.0 lb

## 2024-03-09 DIAGNOSIS — E1169 Type 2 diabetes mellitus with other specified complication: Secondary | ICD-10-CM

## 2024-03-09 DIAGNOSIS — E782 Mixed hyperlipidemia: Secondary | ICD-10-CM

## 2024-03-09 DIAGNOSIS — R4182 Altered mental status, unspecified: Secondary | ICD-10-CM | POA: Diagnosis not present

## 2024-03-09 DIAGNOSIS — E538 Deficiency of other specified B group vitamins: Secondary | ICD-10-CM | POA: Diagnosis not present

## 2024-03-09 DIAGNOSIS — R110 Nausea: Secondary | ICD-10-CM

## 2024-03-09 DIAGNOSIS — E785 Hyperlipidemia, unspecified: Secondary | ICD-10-CM

## 2024-03-09 DIAGNOSIS — Z794 Long term (current) use of insulin: Secondary | ICD-10-CM

## 2024-03-09 DIAGNOSIS — E119 Type 2 diabetes mellitus without complications: Secondary | ICD-10-CM | POA: Diagnosis not present

## 2024-03-09 MED ORDER — INSULIN ASPART 100 UNIT/ML IJ SOLN: INTRAMUSCULAR | 11 refills | Status: DC

## 2024-03-09 MED ORDER — ACCU-CHEK GUIDE TEST VI STRP: 1.0000 | ORAL_STRIP | Freq: Four times a day (QID) | 12 refills | Status: AC

## 2024-03-09 MED ORDER — OZEMPIC (2 MG/DOSE) 8 MG/3ML ~~LOC~~ SOPN: 2.0000 mg | PEN_INJECTOR | SUBCUTANEOUS | 11 refills | Status: AC

## 2024-03-09 MED ORDER — ACCU-CHEK GUIDE W/DEVICE KIT: 1.0000 | PACK | Freq: Four times a day (QID) | 1 refills | Status: AC

## 2024-03-09 MED ORDER — ACCU-CHEK SOFTCLIX LANCETS MISC: 12 refills | Status: DC

## 2024-03-09 NOTE — Assessment & Plan Note (Signed)
 Ask her to start fish oil 600 mg twice a day for triglyceridemia.

## 2024-03-09 NOTE — Assessment & Plan Note (Addendum)
 Attributed to Flexeril , Ambien , Percocet, gabapentin .  Ambien  now 5 mg.  She is off Semglee  (glargine) insulin .  Please stay off of this for now.  She has asked for montelukast  and Phenergan .  Both of these medicines are associated with altered mental status in elderly people.  Advised she cannot have those at this time. Gabapentin  is decreased to 400mg  TID.  Oxycodone  is 10mg  TID (down from QID).

## 2024-03-09 NOTE — Assessment & Plan Note (Signed)
B12 injections today
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
## 2024-03-10 ENCOUNTER — Telehealth: Payer: Self-pay | Admitting: Family Medicine

## 2024-03-10 ENCOUNTER — Telehealth: Payer: Self-pay

## 2024-03-10 DIAGNOSIS — E119 Type 2 diabetes mellitus without complications: Secondary | ICD-10-CM

## 2024-03-10 MED ORDER — PEN NEEDLES 32G X 5 MM MISC: 1.0000 | Freq: Four times a day (QID) | 3 refills | Status: DC

## 2024-03-10 MED ORDER — ACCU-CHEK SOFTCLIX LANCETS MISC: 1.0000 | Freq: Four times a day (QID) | 12 refills | Status: AC

## 2024-03-10 NOTE — Telephone Encounter (Signed)
 Copied from CRM [id number]. Topic: Clinical - Prescription Issue >> Mar 10, 2024  3:42 PM [name] J wrote: Reason for CRM: Pt states she needs a prescription order fpor needls in order to pick up her insulin  aspart (NOVOLOG ) 100 UNIT/ML injection, the pharmacy has the medication but nothing for the pt to administer It with. Please advise, callback # [phone]

## 2024-03-10 NOTE — Telephone Encounter (Signed)
 Copied from CRM [id number]. Topic: Clinical - Prescription Issue >> Mar 09, 2024  5:38 PM Phil Braun wrote: Reason for CRM:   Pt called and stated that she was suppose to get phenergan  today for nausea but it was not at the pharmacy. Could you please check on that.

## 2024-03-10 NOTE — Telephone Encounter (Signed)
Pen needles have been sent 

## 2024-03-10 NOTE — Telephone Encounter (Signed)
 Called [facility] on her cell phone and recording said no longer in service.  Tried her home phone and no one answered an no voice mail set up

## 2024-03-10 NOTE — Addendum Note (Signed)
 Addended by: Evans Him on: 03/10/2024 02:47 PM   Modules accepted: Orders

## 2024-03-11 ENCOUNTER — Telehealth: Payer: Self-pay | Admitting: Family Medicine

## 2024-03-11 ENCOUNTER — Other Ambulatory Visit: Payer: Self-pay | Admitting: Family Medicine

## 2024-03-11 DIAGNOSIS — R110 Nausea: Secondary | ICD-10-CM

## 2024-03-11 MED ORDER — PROMETHAZINE HCL 25 MG PO TABS: 25.0000 mg | ORAL_TABLET | Freq: Three times a day (TID) | ORAL | 2 refills | Status: DC | PRN

## 2024-03-11 NOTE — Telephone Encounter (Signed)
 Tried to call patient about the phenergan .  No answer to house phone and cell number says it isi no longer in service.

## 2024-03-16 ENCOUNTER — Telehealth: Payer: Self-pay | Admitting: Family Medicine
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
## 2024-03-16 NOTE — Telephone Encounter (Signed)
 Copied from CRM [id number]. Topic: General - Other >> Mar 16, 2024 11:03 AM [name] J wrote: Reason for CRM: [name] is requesting a call back in order to return the pts oxygen machine to [employer] because she is not using it. She is being charged for the oxygen and she is ot using it but they will not remove it without authorization from provider. Please advise [phone]

## 2024-03-18 ENCOUNTER — Other Ambulatory Visit: Payer: Self-pay | Admitting: Family Medicine

## 2024-03-18 NOTE — Telephone Encounter (Signed)
 Medication last ordered 4 years ago by Luna Salinas, MD. Please advise

## 2024-03-18 NOTE — Telephone Encounter (Unsigned)
 Copied from CRM [id number]. Topic: Clinical - Medication Refill >> Mar 18, 2024  1:11 PM Cynthia K wrote: Medication: levothyroxine  (SYNTHROID ) 75 MCG tablet  Has the patient contacted their pharmacy? Yes (Agent: If no, request that the patient contact the pharmacy for the refill. If patient does not wish to contact the pharmacy document the reason why and proceed with request.) (Agent: If yes, when and what did the pharmacy advise?) Pharmacy needs order to refill  This is the patient's preferred [facility]:  [facility] [address] [place] [facility] [address] [place] [address] Phone: [phone] Fax: [phone]  Is this the correct pharmacy for this prescription? Yes If no, delete pharmacy and type the correct one.   Has the prescription been filled recently? No  Is the patient out of the medication? Yes - She will be out of medication tomorrow  Has the patient been seen for an appointment in the last year OR does the patient have an upcoming appointment? Yes  Can we respond through MyChart? No  Agent: Please be advised that Rx refills may take up to 3 business days. We ask that you follow-up with your pharmacy.

## 2024-03-19 MED ORDER — LEVOTHYROXINE SODIUM 75 MCG PO TABS
75.0000 ug | ORAL_TABLET | Freq: Every day | ORAL | 1 refills | Status: DC
Start: 2024-03-19 — End: 2024-05-11

## 2024-04-09 ENCOUNTER — Encounter: Payer: Self-pay | Admitting: Family Medicine

## 2024-04-09 ENCOUNTER — Ambulatory Visit (INDEPENDENT_AMBULATORY_CARE_PROVIDER_SITE_OTHER): Admitting: Family Medicine

## 2024-04-09 VITALS — BP 136/73 | HR 89 | Temp 98.4°F | Resp 18 | Ht 64.0 in | Wt 242.0 lb

## 2024-04-09 DIAGNOSIS — E1169 Type 2 diabetes mellitus with other specified complication: Secondary | ICD-10-CM

## 2024-04-09 DIAGNOSIS — F321 Major depressive disorder, single episode, moderate: Secondary | ICD-10-CM | POA: Diagnosis not present

## 2024-04-09 DIAGNOSIS — E538 Deficiency of other specified B group vitamins: Secondary | ICD-10-CM | POA: Diagnosis not present

## 2024-04-09 DIAGNOSIS — E785 Hyperlipidemia, unspecified: Secondary | ICD-10-CM

## 2024-04-09 DIAGNOSIS — I10 Essential (primary) hypertension: Secondary | ICD-10-CM
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
## 2024-04-11 MED ORDER — METOPROLOL SUCCINATE ER 50 MG PO TB24: 75.0000 mg | ORAL_TABLET | Freq: Every day | ORAL | 1 refills | Status: DC

## 2024-04-11 MED ORDER — INSULIN ASPART 100 UNIT/ML IJ SOLN: INTRAMUSCULAR | 11 refills | Status: DC

## 2024-04-11 NOTE — Assessment & Plan Note (Signed)
 She is on NovoLog  10 units twice daily, Semglee  40 units twice daily and Ozempic  2 mg weekly.  Increase Semglee  to 50 twice daily please.  Please take 10 units each time you eat a meal.
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
## 2024-04-11 NOTE — Assessment & Plan Note (Signed)
 B12 injection today

## 2024-04-11 NOTE — Assessment & Plan Note (Signed)
 She is on metoprolol  succinate 50 mg.  Blood pressure is well-controlled at 136/73

## 2024-04-12 ENCOUNTER — Other Ambulatory Visit: Payer: Self-pay | Admitting: Family Medicine

## 2024-04-12 DIAGNOSIS — E1169 Type 2 diabetes mellitus with other specified complication: Secondary | ICD-10-CM

## 2024-04-13 ENCOUNTER — Telehealth: Payer: Self-pay | Admitting: Family Medicine

## 2024-04-13 NOTE — Telephone Encounter (Signed)
 Spoke with Samone (Librarian, academic) who states that forms were for records request were faxed to a [employer] email. Direct fax number for [employer] was provided. Samone will fax request to office.
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
## 2024-04-22 NOTE — Telephone Encounter (Signed)
 Changes Requested   NOVOLOG  FLEXPEN 100 UNIT/ML FlexPen       Changed from: insulin  aspart (NOVOLOG ) 100 UNIT/ML injection   Sig: N/A   Disp: Not specified    Refills: 0   Start: 04/12/2024   Class: Normal   Non-formulary For: Type 2 diabetes mellitus with hyperlipidemia (HCC)   Last ordered: 1 week ago (04/11/2024) by Susan K Ziglar, MD   Last refill: 04/11/2024   Rx #: [id number]   Pharmacy comment: Alternative Requested:[facility].     [id number], [place] - [address]      Dr. Ziglar, please advise.

## 2024-05-08 ENCOUNTER — Other Ambulatory Visit: Payer: Self-pay | Admitting: Family Medicine

## 2024-05-10 ENCOUNTER — Emergency Department
Admission: EM | Admit: 2024-05-10 | Discharge: 2024-05-10 | Discharge status: Against Medical Advice | Attending: Emergency Medicine | Admitting: Emergency Medicine

## 2024-05-10 ENCOUNTER — Encounter: Payer: Self-pay | Admitting: Emergency Medicine

## 2024-05-10 ENCOUNTER — Other Ambulatory Visit: Payer: Self-pay

## 2024-05-10 ENCOUNTER — Encounter: Payer: Self-pay | Admitting: Oncology

## 2024-05-10 ENCOUNTER — Ambulatory Visit (INDEPENDENT_AMBULATORY_CARE_PROVIDER_SITE_OTHER): Admitting: Family Medicine

## 2024-05-10 ENCOUNTER — Emergency Department

## 2024-05-10 ENCOUNTER — Encounter: Payer: Self-pay | Admitting: Family Medicine

## 2024-05-10 VITALS — BP 101/69 | HR 99 | Temp 98.0°F | Resp 18 | Ht 64.0 in | Wt 237.0 lb

## 2024-05-10 DIAGNOSIS — E785 Hyperlipidemia, unspecified: Secondary | ICD-10-CM | POA: Diagnosis not present

## 2024-05-10 DIAGNOSIS — E538 Deficiency of other specified B group vitamins: Secondary | ICD-10-CM

## 2024-05-10 DIAGNOSIS — I951 Orthostatic hypotension: Secondary | ICD-10-CM | POA: Diagnosis not present

## 2024-05-10 DIAGNOSIS — Z5321 Procedure and treatment not carried out due to patient leaving prior to being seen by health care provider: Secondary | ICD-10-CM | POA: Diagnosis not present

## 2024-05-10 DIAGNOSIS — E1169 Type 2 diabetes mellitus with other specified complication: Secondary | ICD-10-CM | POA: Diagnosis not present

## 2024-05-10 DIAGNOSIS — M549 Dorsalgia, unspecified: Secondary | ICD-10-CM | POA: Insufficient documentation

## 2024-05-10 DIAGNOSIS — E119 Type 2 diabetes mellitus without complications: Secondary | ICD-10-CM

## 2024-05-10 DIAGNOSIS — Z794 Long term (current) use of insulin: Secondary | ICD-10-CM | POA: Diagnosis not present

## 2024-05-10 DIAGNOSIS — R42 Dizziness and giddiness: Secondary | ICD-10-CM | POA: Diagnosis present

## 2024-05-10 LAB — COMPREHENSIVE METABOLIC PANEL WITH GFR
ALT: 21 U/L (ref 0–44)
AST: 21 U/L (ref 15–41)
Albumin: 3.6 g/dL (ref 3.5–5.0)
Alkaline Phosphatase: 64 U/L (ref 38–126)
Anion gap: 11 (ref 5–15)
BUN: 24 mg/dL — ABNORMAL HIGH (ref 8–23)
CO2: 23 mmol/L (ref 22–32)
Calcium: 9.4 mg/dL (ref 8.9–10.3)
Chloride: 101 mmol/L (ref 98–111)
Creatinine, Ser: 1.23 mg/dL — ABNORMAL HIGH (ref 0.44–1.00)
GFR, Estimated: 46 mL/min — ABNORMAL LOW (ref >60)
Glucose, Bld: 144 mg/dL — ABNORMAL HIGH (ref 70–99)
Potassium: 4.4 mmol/L (ref 3.5–5.1)
Sodium: 135 mmol/L (ref 135–145)
Total Bilirubin: 0.7 mg/dL (ref 0.0–1.2)
Total Protein: 6.8 g/dL (ref 6.5–8.1)

## 2024-05-10 LAB — CBC
HCT: 39.6 % (ref 36.0–46.0)
Hemoglobin: 12.9 g/dL (ref 12.0–15.0)
MCH: 29.7 pg (ref 26.0–34.0)
MCHC: 32.6 g/dL (ref 30.0–36.0)
MCV: 91.2 fL (ref 80.0–100.0)
Platelets: 162 K/uL (ref 150–400)
RBC: 4.34 MIL/uL (ref 3.87–5.11)
RDW: 13.5 % (ref 11.5–15.5)
WBC: 11.8 K/uL — ABNORMAL HIGH (ref 4.0–10.5)
nRBC: 0 % (ref 0.0–0.2)

## 2024-05-10 LAB — POCT GLYCOSYLATED HEMOGLOBIN (HGB A1C): Hemoglobin A1C: 7.7 % — AB (ref 4.0–5.6)

## 2024-05-10 LAB — TROPONIN I (HIGH SENSITIVITY): Troponin I (High Sensitivity): 5 ng/L (ref <18)

## 2024-05-10 MED ORDER — NOVOLOG FLEXPEN 100 UNIT/ML ~~LOC~~ SOPN: 10.0000 [IU] | PEN_INJECTOR | Freq: Three times a day (TID) | SUBCUTANEOUS | 1 refills | Status: DC

## 2024-05-10 NOTE — Progress Notes (Signed)
 Established Patient Office Visit  Subjective   Patient ID: [name], female    DOB: May 22, 1946  Age: 78 y.o. MRN: [id number]  Chief Complaint  Patient presents with   Medical Management of Chronic Issues   Felton Rasmussen 4 times. Legs give out.    Spasms    Legs     HPI [name] 68-YO woman with HTN, DMT2 (A1c 7.7% 05/10/2024,  on insulin  SS and Ozempic ) HFpEF, mixed hyperlipidemia, CKD (stage IIIa), depression/GAD, OSA (untreated), CAD (s/p PCI to LAD), hypothyroidism, GERD, B12 deficiency, s/p B TKR, lumbar laminectomy syndrome, Hx hyponatremia, 02/22/2024 AMS secondary to polypharmacy.       Reports she just does not feel well.  She is having spasms in her legs.  She gets up to walk and feels dizzy for a few seconds but she has fallen 4 times in the last 2 days trying to walk.  All 4 falls happened after she had been standing for several minutes.  She pulled the walker over on herself twice.  She did not realize she was going to fall.   EMS was called out the house to get her up out of the floor after all 4 falls.  She denies a cough or fever.  She does feel little bit short of breath when she is up walking.  Mostly she just feels weak when she is up walking.  She denies neck, jaw/face, chest or arm pain.  She denies diaphoresis with the falling.  She has not lost consciousness.  She did strike her head in one fall.       She is taking metoprolol  succinate 50 mg daily for palpitations.  She has a prescription for furosemide  40 mg which she takes once or twice a month.  She has not had this recently.  Otherwise she has taken her medications as directed.  She is not taking Farxiga 10 mg and has not had this in weeks.        A1c today is 7.7%.  She takes NovoLog  10 units with meals and Ozempic  2 mg weekly.  She reports no episodes of low blood sugar or confusion, shakiness, headache and diaphoresis.     ROS    Objective:     BP 101/69 (BP Location: Left Arm, Patient Position:  Standing, Cuff Size: Large)   Pulse 99   Temp 98 F (36.7 C) (Oral)   Resp 18   Ht 5' 4 (1.626 m)   Wt 237 lb (107.5 kg)   SpO2 93%   BMI 40.68 kg/m    Physical Exam Vitals and nursing note reviewed.  Constitutional:      Appearance: Normal appearance.  HENT:     Head: Normocephalic and atraumatic.  Eyes:     Conjunctiva/sclera: Conjunctivae normal.  Cardiovascular:     Rate and Rhythm: Normal rate and regular rhythm.  Pulmonary:     Effort: Pulmonary effort is normal.     Breath sounds: Normal breath sounds.  Musculoskeletal:     Right lower leg: No edema.     Left lower leg: No edema.  Skin:    General: Skin is warm and dry.  Neurological:     Mental Status: She is alert and oriented to person, place, and time.  Psychiatric:        Mood and Affect: Mood normal.        Behavior: Behavior normal.        Thought Content: Thought content normal.  Judgment: Judgment normal.          Results for orders placed or performed in visit on 05/10/24  POCT glycosylated hemoglobin (Hb A1C)  Result Value Ref Range   Hemoglobin A1C 7.7 (A) 4.0 - 5.6 %   HbA1c POC (<> result, manual entry)     HbA1c, POC (prediabetic range)     HbA1c, POC (controlled diabetic range)        The 10-year ASCVD risk score (Arnett DK, et al., 2019) is: 26.8%    Assessment & Plan:  Insulin  dependent type 2 diabetes mellitus (HCC) -     POCT glycosylated hemoglobin (Hb A1C)  Type 2 diabetes mellitus with hyperlipidemia (HCC) -     NovoLOG  FlexPen; Inject 10 Units into the skin 3 (three) times daily with meals.  Dispense: 15 mL; Refill: 1  Orthostatic hypotension Assessment & Plan: She was admitted to [facility] 11/8 through 09/16/2023 with 2 weeks of dyspnea and edema.  Echo LVEF greater than 50%, BNP 19, troponin negative x 2 but she was found to be hypoxic in the low 80s and hypotensive.  She had a low cortisol.  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  She was  treated with steroids, bronchodilators and infectious workup suspected.  SIRS with bibasilar atelectasis on chest x-ray.  She was treated with Rocephin  and azithromycin  courses in the hospital.  Respiratory virus panel was negative, COVID was negative, strep pneumo urinary antigen was negative and procalcitonin was negative.  Creatinine was 1.7 considered AKI on CKD as her baseline is 1.1-1.3.  Also had mild hyponatremia that resolved.  Given a previous episode of orthostatic hypotension and hypotension that was related to pneumonia and low cortisol advised patient to go to the ER today for more extensive workup including chest x-ray, CT angio, troponin. Patient is in agreement.       Return Follow-up after hospital discharge.    Whitney Gashi K Humbert Morozov, MD

## 2024-05-10 NOTE — ED Notes (Signed)
No answer when called from lobby x3. 

## 2024-05-10 NOTE — Assessment & Plan Note (Addendum)
 She was admitted to [facility] 11/8 through 09/16/2023 with 2 weeks of dyspnea and edema.  Echo LVEF greater than 50%, BNP 19, troponin negative x 2 but she was found to be hypoxic in the low 80s and hypotensive.  She had a low cortisol.  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  She was treated with steroids, bronchodilators and infectious workup suspected.  SIRS with bibasilar atelectasis on chest x-ray.  She was treated with Rocephin  and azithromycin  courses in the hospital.  Respiratory virus panel was negative, COVID was negative, strep pneumo urinary antigen was negative and procalcitonin was negative.  Creatinine was 1.7 considered AKI on CKD as her baseline is 1.1-1.3.  Also had mild hyponatremia that resolved.  Given a previous episode of orthostatic hypotension and hypotension that was related to pneumonia and low cortisol advised patient to go to the ER today for more extensive workup including chest x-ray, CT angio, troponin. Patient is in agreement.

## 2024-05-10 NOTE — ED Triage Notes (Addendum)
 Patient to ED via [facility] from doctor's office for dizziness/hypotension. PT reports falling x4 over the past 2 days. Pt states she dizzy prior to falling. States she did hit her head but denies LOC or blood thinners. C/o back pain- hx of same. States recent change in BP meds. Hx of anemia. Patient pale in color.

## 2024-05-10 NOTE — ED Notes (Signed)
 First Nurse Note: Pt to ED via [facility] from her MD office for increased fall and orthostatic hypotension, fatigue and not feeling well.   CBG 175 BP 125/58, BP 80 systolic when standing SpO2- 100% HR 90

## 2024-05-11 ENCOUNTER — Telehealth: Payer: Self-pay | Admitting: Family Medicine

## 2024-05-11 DIAGNOSIS — E039 Hypothyroidism, unspecified: Secondary | ICD-10-CM

## 2024-05-11 DIAGNOSIS — E119 Type 2 diabetes mellitus without complications: Secondary | ICD-10-CM

## 2024-05-11 DIAGNOSIS — E1169 Type 2 diabetes mellitus with other specified complication: Secondary | ICD-10-CM

## 2024-05-11 MED ORDER — LEVOTHYROXINE SODIUM 75 MCG PO TABS: 75.0000 ug | ORAL_TABLET | Freq: Every day | ORAL | 1 refills | Status: AC

## 2024-05-11 MED ORDER — NOVOLOG FLEXPEN 100 UNIT/ML ~~LOC~~ SOPN: 10.0000 [IU] | PEN_INJECTOR | Freq: Three times a day (TID) | SUBCUTANEOUS | 1 refills | Status: DC

## 2024-05-11 MED ORDER — PEN NEEDLES 32G X 5 MM MISC: 1.0000 | Freq: Four times a day (QID) | 3 refills | Status: AC

## 2024-05-11 NOTE — Telephone Encounter (Signed)
 Incoming call from E2C2 from Mr. Scharlene who stated patient needs 2 refilled of the insulin  pens.  Opened the last one today.  Last OV was 05/10/24

## 2024-05-11 NOTE — Telephone Encounter (Signed)
 Spoke with patient.  She went to the ER yesterday via ambulance for four falls in 2 days and orthostatic hypotension.  She got her blood work and 2 CT scans done and waited until 10:30 at night.  She had not seen a physician and her back was quite painful.  She decided to leave the ER.   Reviewed her head and cervical spine CT results which were benign.  Reviewed her labs which indicate that she is dehydrated with Cr 1.23 and BUN 24.  She has been drinking almost exclusively green tea which does have some caffeine.  She switched over to Crystal light to work on her hydration.  Ask her not to take a furosemide  or Lasix  pill this week.  Also hold metoprolol .  Will reassess metoprolol  use on her appointment in August. If she falls again or develops any more symptoms may insist that she return to the ER.

## 2024-05-11 NOTE — Telephone Encounter (Signed)
 Novolog  flex pen and pen needles sent to [facility]

## 2024-05-11 NOTE — Addendum Note (Signed)
 Addended by: RAYANN REXENE HERO on: 05/11/2024 04:36 PM   Modules accepted: Orders

## 2024-05-12 ENCOUNTER — Telehealth: Payer: Self-pay | Admitting: Family Medicine

## 2024-05-12 NOTE — Telephone Encounter (Signed)
 Copied from CRM [id number]. Topic: Medical Record Request - Records Request >> May 11, 2024  2:09 PM Montie POUR wrote: Reason for CRM:  [name] needs medical records from June, 2023 for Medicaid. Please call [name] at [phone] to let her know when records are ready.  Please fax records to DSS fax number [phone]. She needs records faxed in by 05/17/24 per DSS. Thanks   Please let [name] know they need to fax in a request for records. HIM will send the records to DSS.

## 2024-05-13 ENCOUNTER — Other Ambulatory Visit: Payer: Self-pay | Admitting: Family Medicine

## 2024-05-13 DIAGNOSIS — E1169 Type 2 diabetes mellitus with other specified complication: Secondary | ICD-10-CM

## 2024-05-13 NOTE — Telephone Encounter (Signed)
 Copied from CRM [id number]. Topic: Clinical - Medication Refill >> May 13, 2024  3:50 PM DeAngela L wrote: Medication: semglee  pen 100 units   Has the patient contacted their pharmacy? Yes  (Agent: If no, request that the patient contact the pharmacy for the refill. If patient does not wish to contact the pharmacy document the reason why and proceed with request.) (Agent: If yes, when and what did the pharmacy advise?)  This is the patient's preferred pharmacy:  [facility] [address], [place] - [address] [place] [address] Phone: [phone] Fax: [phone]  Is this the correct pharmacy for this prescription? Yes  If no, delete pharmacy and type the correct one.   Has the prescription been filled recently? No   Is the patient out of the medication? Yes   Has the patient been seen for an appointment in the last year OR does the patient have an upcoming appointment? Yes   Can we respond through MyChart? No   Agent: Please be advised that Rx refills may take up to 3 business days. We ask that you follow-up with your pharmacy.

## 2024-05-13 NOTE — Telephone Encounter (Signed)
 Medical records from June 2023 are not available to fax. DSS will have to send request to [email or web address] in order to obtain records from Dr. Darilyn previous practice.    Spoke with Ms. Howington and provided her with the fax number to medical records so that DSS can obtain office notes.  Patient verbalized understanding. All questions and concerns have been addressed.

## 2024-05-14 ENCOUNTER — Ambulatory Visit: Payer: Self-pay

## 2024-05-14 ENCOUNTER — Other Ambulatory Visit: Payer: Self-pay | Admitting: Family Medicine

## 2024-05-14 DIAGNOSIS — E1169 Type 2 diabetes mellitus with other specified complication: Secondary | ICD-10-CM

## 2024-05-14 MED ORDER — NOVOLOG FLEXPEN 100 UNIT/ML ~~LOC~~ SOPN: 10.0000 [IU] | PEN_INJECTOR | Freq: Three times a day (TID) | SUBCUTANEOUS | 1 refills | Status: DC

## 2024-05-14 NOTE — Telephone Encounter (Signed)
 FYI Only or Action Required?: FYI only for provider.  Patient was last seen in primary care on 05/10/2024 by Ziglar, Susan K, MD.  Called Nurse Triage reporting Advice Only.       Copied from CRM [id number]. Topic: Clinical - Red Word Triage >> May 14, 2024  5:08 PM DeAngela L wrote: Red Word that prompted transfer to Nurse Triage: pt calling about her prescription refill read note from Dr Ziglar today at 1:59 pm and the pt states she has never stopped taking meds and can't go off of taking her meds and her sugar is stable right now  Pt num [phone] Reason for Disposition  Health information question, no triage required and triager able to answer question  Answer Assessment - Initial Assessment Questions 1. REASON FOR CALL: What is the main reason for your call? or How can I best help you?     Pt called very upset because her PCP denied filling a diabetic medication for the patient.  The medication is not on the current medication list therefore PCP d/c this medication.  Pt strongly disagrees that the PCP stopped the medication and the patient has still been taking this medication.  Pt stated she is afraid to be without this medication.  Pt states s/s of hyper/hypogycemia.  Recommend pt monitor FSBS closely then call back or go to ED if issues. Appointment schedule to meet/discuss with PCP.  Protocols used: Information Only Call - No Triage-A-AH

## 2024-05-14 NOTE — Telephone Encounter (Signed)
 Copied from CRM [id number]. Topic: Clinical - Red Word Triage >> May 14, 2024 11:57 AM Elle L wrote: Red Word that prompted transfer to Nurse Triage: The patient's daughter states that the patient is feeling unwell from being without her Semglee  (insulin  glargine-yfgn) 100 Units/mL, However, she declined Nurse Triage so I am sending to the office per protocol. She did not give further details of her symptoms and states her blood sugar is okay right now but she generally feels unwell.  She requested a refill on 7/10 and I did advise of the 3 business day turnaround time.

## 2024-05-14 NOTE — Telephone Encounter (Signed)
 Semglee  is not active on patient's medication list. Please advise.

## 2024-05-17 ENCOUNTER — Ambulatory Visit: Payer: Self-pay

## 2024-05-17 NOTE — Telephone Encounter (Signed)
 This RN spoke to patient and her daughter. They report that patient has Novolog  and Semaglutide  but that she does not have Semglee . Patient BS while on the telephone was reported as 207.  Patient unable to obtain transportation for in person appt tomorrow. Scheduled virtual appt and assisted with MyChart activation.  This RN made patient's daughter aware that the office would be contacted regarding this.   Copied from CRM [id number]. Topic: Clinical - Red Word Triage >> May 17, 2024 11:28 AM Elle L wrote: Red Word that prompted transfer to Nurse Triage: The patient's daughter is on the line with the patient and states the patient does not have her insulin  and her blood sugar was in the 300's last night and she has been unable to check it today but she is feeling unwell.

## 2024-05-17 NOTE — Telephone Encounter (Signed)
 Unable to reach patient at either number on file. Mobile number on file does not appear to belong to patient.

## 2024-05-18 ENCOUNTER — Telehealth (INDEPENDENT_AMBULATORY_CARE_PROVIDER_SITE_OTHER): Admitting: Family Medicine

## 2024-05-18 ENCOUNTER — Other Ambulatory Visit: Payer: Self-pay | Admitting: Family Medicine

## 2024-05-18 ENCOUNTER — Ambulatory Visit: Admitting: Family Medicine

## 2024-05-18 DIAGNOSIS — E119 Type 2 diabetes mellitus without complications: Secondary | ICD-10-CM | POA: Diagnosis not present

## 2024-05-18 DIAGNOSIS — Z794 Long term (current) use of insulin: Secondary | ICD-10-CM | POA: Diagnosis not present

## 2024-05-18 MED ORDER — INSULIN GLARGINE-YFGN 100 UNIT/ML ~~LOC~~ SOPN: 50.0000 [IU] | PEN_INJECTOR | Freq: Two times a day (BID) | SUBCUTANEOUS | 11 refills | Status: DC

## 2024-05-18 NOTE — Progress Notes (Signed)
   Established Patient Office Visit  Subjective   Patient ID: [name], female    DOB: Dec 05, 1945  Age: 78 y.o. MRN: [id number]  No chief complaint on file.   HPI Virtual Visit via Video Note  I connected with Roderick LITTIE Cheese on 05/18/24 at 10:30 AM EDT by a video enabled telemedicine application and verified that I am speaking with the correct person using two identifiers.  Location: Patient: home Provider: office   I discussed the limitations of evaluation and management by telemedicine and the availability of in person appointments. The patient expressed understanding and agreed to proceed.  History of Present Illness:  [name] 77-YO woman with HTN, DMT2 (A1c 7.7% 05/10/2024,  on insulin  SS, Semglee  and Ozempic ) HFpEF, mixed hyperlipidemia, CKD (stage IIIa), depression/GAD, OSA (untreated), CAD (s/p PCI to LAD), hypothyroidism, GERD, B12 deficiency, s/p B TKR, lumbar laminectomy syndrome, Hx hyponatremia, 02/22/2024 AMS secondary to polypharmacy, orthostatic hypotension and multiple falls (stopped metoprolol ).  She reports since stopping the metoprolol  and pushing fluids that she feels a lot better, a whole lot better.  Her legs are stronger and she has not fallen.  She does report getting dizzy with standing every once in a while but it does not last very long.  She is out of Semglee , her generic Insulin  Glargine.  She has been taking 50 units twice a day.  The hospital stopped her Semglee  and put her on NovoLog  sliding scale at meals.  She has not been taking the NovoLog  but has been taking the Semglee .  We were following the discharge instructions and thought she was off Semglee .  Her blood sugar this morning was 250.  When she is on Semglee  it runs in the 140s to 190s.  Advised we can restart the Semglee  immediately.  Her last dose of Semglee  was Thursday.    Observations/Objective:   Assessment and Plan:  Insulin -dependent diabetes:  Will refill Semglee  at 50 units  twice daily.  She is no longer doing NovoLog  sliding scale at meals.  Please check your fasting glucose daily.  Orthostatic hypotension: Improved with hydration and stopping metoprolol  succinate.  Please check your blood pressure and record.  She has a follow-up appointment August 18 will discuss blood pressures then.   Follow Up Instructions:    I discussed the assessment and treatment plan with the patient. The patient was provided an opportunity to ask questions and all were answered. The patient agreed with the plan and demonstrated an understanding of the instructions.   The patient was advised to call back or seek an in-person evaluation if the symptoms worsen or if the condition fails to improve as anticipated.  I provided 7 minutes of non-face-to-face time during this encounter.   Besan Ketchem K Beth Spackman, MD

## 2024-05-20 ENCOUNTER — Telehealth: Payer: Self-pay | Admitting: Family Medicine

## 2024-05-20 NOTE — Telephone Encounter (Signed)
 Copied from CRM [id number]. Topic: Clinical - Medication Question >> May 20, 2024  3:32 PM Montie POUR wrote: Reason for CRM:  Garrel called who is Ms. Latona's caregiver. He would like a nurse to call him back today about a medication. He was very rude and I could not understand the medication that he is speaking about. I think it is her insulin . Please call him at [phone].

## 2024-05-21 NOTE — Telephone Encounter (Signed)
 I called Garrel at [phone] and there was no answer nor a voice mail set up to receive messages.

## 2024-05-24 ENCOUNTER — Telehealth: Payer: Self-pay | Admitting: Family Medicine

## 2024-05-24 NOTE — Telephone Encounter (Signed)
 Called both mobile and home number.  [name] did not answer either phone and was unable to leave message.  [facility] is trying to reach [place] concerning her daughter Bari in the ICU.  Unable to leave her a message.

## 2024-06-09 ENCOUNTER — Ambulatory Visit: Admitting: Family Medicine

## 2024-06-11 ENCOUNTER — Ambulatory Visit: Admitting: Family Medicine

## 2024-06-11 ENCOUNTER — Encounter: Payer: Self-pay | Admitting: Family Medicine

## 2024-06-11 VITALS — BP 115/75 | HR 89 | Temp 97.4°F | Resp 18 | Ht 64.0 in | Wt 237.0 lb

## 2024-06-11 DIAGNOSIS — E119 Type 2 diabetes mellitus without complications: Secondary | ICD-10-CM | POA: Diagnosis not present

## 2024-06-11 DIAGNOSIS — M48061 Spinal stenosis, lumbar region without neurogenic claudication: Secondary | ICD-10-CM

## 2024-06-11 DIAGNOSIS — E538 Deficiency of other specified B group vitamins: Secondary | ICD-10-CM

## 2024-06-11 DIAGNOSIS — M545 Low back pain, unspecified: Secondary | ICD-10-CM

## 2024-06-11 DIAGNOSIS — I10 Essential (primary) hypertension: Secondary | ICD-10-CM

## 2024-06-11 DIAGNOSIS — Z794 Long term (current) use of insulin: Secondary | ICD-10-CM

## 2024-06-11 DIAGNOSIS — F321 Major depressive disorder, single episode, moderate: Secondary | ICD-10-CM

## 2024-06-11 MED ORDER — VILAZODONE HCL 40 MG PO TABS: 40.0000 mg | ORAL_TABLET | Freq: Every day | ORAL | 1 refills | Status: DC

## 2024-06-11 MED ORDER — MELOXICAM 7.5 MG PO TABS: ORAL_TABLET | ORAL | 0 refills | Status: AC

## 2024-06-11 NOTE — Assessment & Plan Note (Signed)
 She is off her metoprolol  and her blood pressure is 115/75.  She wore a Holter monitor for 3 days by cardiology.

## 2024-06-11 NOTE — Assessment & Plan Note (Signed)
 She goes to the pain clinic and takes Oxycodone  10 mg 4 times a day but this does not always help her back pain.  She would like to have an NSAID.  Advise she could do meloxicam  7.5 mg but only take it in the days when her pain is severe.  Advised that meloxicam  can increase her risk of gastric ulcer as well as damage her kidneys.

## 2024-06-11 NOTE — Assessment & Plan Note (Signed)
 She is on Semglee  50 units twice daily and Humalog  10 units with food.  Reports her fasting blood sugars are in the 140s to 50s.

## 2024-06-11 NOTE — Progress Notes (Signed)
 Established Patient Office Visit  Subjective   Patient ID: [name], female    DOB: 01-21-1946  Age: 78 y.o. 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 Echo LVEF greater than 50%, BNP 19, troponin negative x 2 but she was found to be hypoxic in the low 80s and hypotensive. She had a low cortisol. 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  Creatinine was 1.7 considered AKI on CKD as her baseline is 1.1-1.3. Also had mild hyponatremia that resolved. She was admitted to hospital 02/22/2024 with AMS  secondary to polypharmacy (flexeril ?)  She is not taking metoprolol  currently and blood pressure is 115/75.  She has seen cardiology recently and they are aware she is off the metoprolol  secondary to hypotension and orthostatic hypotension.  They set up a Holter monitor for 3 days and she is just turned that back again.  She takes Lasix  20 mg about 1 day a month for peripheral edema.  She tells me that her balance is so poor she has to use a walker.  She tries to walk without her walker she falls.  She reports that she feels quite depressed.  She has a new psychiatrist but has not met with her yet.  She is out of Viibryd  for about a week now.  She thinks her depression is because she is out of her Vybrid.   She takes oxycodone  10 mg 4 a day.  She still has terrible pain in her lumbar back and knees.  She took one of her daughters meloxicam   15 mg and got a great deal of benefit from it.  She is on Semglee  50 units twice daily and Humalog  10 units  with food.  She reports no episodes of low blood sugar or shakiness, diaphoresis, confusion, headache.    Objective:     BP 115/75   Pulse 89   Temp (!) 97.4 F (36.3 C) (Oral)   Resp 18   Ht 5' 4 (1.626 m)   Wt 237 lb (107.5 kg)   SpO2 96%   BMI 40.68 kg/m    Physical Exam Vitals and nursing note reviewed.  Constitutional:      Appearance: Normal appearance.  HENT:     Head: Normocephalic and atraumatic.  Eyes:     Conjunctiva/sclera: Conjunctivae normal.  Cardiovascular:     Rate and Rhythm: Normal rate and regular rhythm.  Pulmonary:     Effort: Pulmonary effort is normal.     Breath sounds: Normal breath sounds.  Musculoskeletal:     Right lower leg: No edema.     Left lower leg: No edema.  Skin:    General: Skin is warm and dry.  Neurological:     Mental Status: She is alert and oriented to person, place, and time.  Psychiatric:        Mood and Affect: Mood normal.        Behavior: Behavior normal.        Thought Content: Thought content normal.        Judgment: Judgment normal.  No results found for any visits on 06/11/24.    The 10-year ASCVD risk score (Arnett DK, et al., 2019) is: 33.3%    Assessment & Plan:  Current moderate episode of major depressive disorder, unspecified whether recurrent (HCC) Assessment & Plan: Refilled her Viibryd  for her.  [facility] as you get [facility]ry [facility] your meds.  Orders: -     Vilazodone  HCl; Take 1 tablet (40 mg total) by mouth daily.  Dispense: 90 tablet; Refill: 1  Lumbar back pain -     Meloxicam ; Take 1 po q day PRN for severe pain  Dispense: 30 tablet; Refill: 0  Essential hypertension Assessment & Plan: She is off her metoprolol  and her blood pressure is 115/75.  She wore a Holter monitor for 3 days by cardiology.   Insulin  dependent type 2 diabetes mellitus (HCC) Assessment & Plan: She is on Semglee  50 units twice daily and Humalog  10 units with food.   Reports her fasting blood sugars are in the 140s to 50s.   Lumbar foraminal stenosis Assessment & Plan: She goes to the pain clinic and takes Oxycodone  10 mg 4 times a day but this does not always help her back pain.  She would like to have an NSAID.  Advise she could do meloxicam  7.5 mg but only take it in the days when her pain is severe.  Advised that meloxicam  can increase her risk of gastric ulcer as well as damage her kidneys.      Return in about 4 weeks (around 07/09/2024).    Labella Zahradnik K Timberly Yott, MD

## 2024-06-11 NOTE — Assessment & Plan Note (Signed)
 Refilled her Viibryd  for her.  Soon as you get established [facility] psychiatry [facility] for [facility] to fill your meds.

## 2024-07-09 ENCOUNTER — Ambulatory Visit: Admitting: Family Medicine

## 2024-07-15 ENCOUNTER — Ambulatory Visit (INDEPENDENT_AMBULATORY_CARE_PROVIDER_SITE_OTHER): Admitting: Family Medicine

## 2024-07-15 ENCOUNTER — Other Ambulatory Visit: Payer: Self-pay

## 2024-07-15 ENCOUNTER — Encounter: Payer: Self-pay | Admitting: Family Medicine

## 2024-07-15 VITALS — BP 124/85 | HR 124 | Temp 97.6°F | Resp 18 | Ht 64.0 in | Wt 231.0 lb

## 2024-07-15 DIAGNOSIS — E119 Type 2 diabetes mellitus without complications: Secondary | ICD-10-CM

## 2024-07-15 DIAGNOSIS — F321 Major depressive disorder, single episode, moderate: Secondary | ICD-10-CM

## 2024-07-15 DIAGNOSIS — F5101 Primary insomnia: Secondary | ICD-10-CM

## 2024-07-15 DIAGNOSIS — B354 Tinea corporis: Secondary | ICD-10-CM

## 2024-07-15 DIAGNOSIS — R110 Nausea: Secondary | ICD-10-CM | POA: Diagnosis not present

## 2024-07-15 DIAGNOSIS — G4700 Insomnia, unspecified: Secondary | ICD-10-CM | POA: Insufficient documentation

## 2024-07-15 DIAGNOSIS — Z794 Long term (current) use of insulin: Secondary | ICD-10-CM

## 2024-07-15 MED ORDER — BELSOMRA 5 MG PO TABS: 5.0000 mg | ORAL_TABLET | Freq: Every evening | ORAL | 1 refills | Status: DC | PRN

## 2024-07-15 MED ORDER — PROMETHAZINE HCL 25 MG PO TABS: 25.0000 mg | ORAL_TABLET | Freq: Three times a day (TID) | ORAL | 2 refills | Status: DC | PRN

## 2024-07-15 MED ORDER — NYSTATIN 100000 UNIT/GM EX POWD: 1.0000 | Freq: Three times a day (TID) | CUTANEOUS | 11 refills | Status: AC

## 2024-07-15 MED ORDER — INSULIN GLARGINE (2 UNIT DIAL) 300 UNIT/ML ~~LOC~~ SOPN: PEN_INJECTOR | SUBCUTANEOUS | 11 refills | Status: DC

## 2024-07-15 NOTE — Assessment & Plan Note (Signed)
 Wll try belsomra  5mg  nightly.  FOLLOW-UP ina month to reassess

## 2024-07-15 NOTE — Assessment & Plan Note (Signed)
 Will check her labs next month for 3 months labs

## 2024-07-15 NOTE — Assessment & Plan Note (Signed)
 Ok to start the Rexulti.  Sending a referral to [facility] for psychiatry.
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
## 2024-07-22 ENCOUNTER — Ambulatory Visit: Payer: Self-pay

## 2024-07-22 NOTE — Telephone Encounter (Signed)
 FYI Only or Action Required?: Action required by provider: Rx request.  Patient was last seen in primary care on 07/15/2024 by [name], Devere POUR, MD.  Called Nurse Triage reporting Urinary Tract Infection.  Symptoms began several days ago.  Interventions attempted: Rest, hydration, or home remedies.  Symptoms are: unchanged.  Triage Disposition: See HCP Within 4 Hours (Or PCP Triage)  Patient/caregiver understands and will follow disposition?: No, wishes to speak with PCP       Copied from CRM #[id number]. Topic: Clinical - Medical Advice >> Jul 22, 2024 10:04 AM [name] wrote: Reason for CRM: wants a prescription for antibiotics for a UTI,says its bad and she needs them urgently. Reason for Disposition  Side (flank) or lower back pain present  Answer Assessment - Initial Assessment Questions 1. SYMPTOM: What's the main symptom you're concerned about? (e.g., frequency, incontinence)     Frequency, burning, foul smell 2. ONSET: When did the  sx  start?     3 days 3. PAIN: Is there any pain? If Yes, ask: How bad is it? (Scale: 1-10; mild, moderate, severe)     yes 4. CAUSE: What do you think is causing the symptoms?     UTI 5. OTHER SYMPTOMS: Do you have any other symptoms? (e.g., blood in urine, fever, flank pain, pain with urination)     Burning with urination, back pain 6. PREGNANCY: Is there any chance you are pregnant? When was your last menstrual period?     N/a    Pt reports she lives far away from PCP office and endorses that PCP is aware about ongoing UTI. Pt requesting rx from PCP.  Triager will forward encounter for Dr [name] 's office to review and advise. Patient verbalized understanding and is expecting call back from office for next steps. Triager also advised that if pt does not hear back from office, to follow disposition for further evaluation/treatment.  Protocols used: Urinary Symptoms-A-AH

## 2024-07-28 ENCOUNTER — Ambulatory Visit: Admitting: Family Medicine

## 2024-08-02 ENCOUNTER — Telehealth: Payer: Self-pay | Admitting: Family Medicine

## 2024-08-02 NOTE — Telephone Encounter (Signed)
 Send Mychart message for patient to call and reschedule. Appointment 08/09/2024 has been cancelled. Provider will not be available.

## 2024-08-03 ENCOUNTER — Other Ambulatory Visit

## 2024-08-09 ENCOUNTER — Ambulatory Visit: Admitting: Family Medicine

## 2024-08-12 ENCOUNTER — Encounter: Payer: Self-pay | Admitting: Family Medicine

## 2024-08-12 ENCOUNTER — Ambulatory Visit: Admitting: Family Medicine

## 2024-08-12 VITALS — BP 130/74 | HR 112 | Temp 97.9°F | Resp 18 | Ht 64.0 in | Wt 239.0 lb

## 2024-08-12 DIAGNOSIS — Z23 Encounter for immunization: Secondary | ICD-10-CM | POA: Diagnosis not present

## 2024-08-12 DIAGNOSIS — F332 Major depressive disorder, recurrent severe without psychotic features: Secondary | ICD-10-CM | POA: Diagnosis not present

## 2024-08-12 DIAGNOSIS — N3000 Acute cystitis without hematuria: Secondary | ICD-10-CM

## 2024-08-12 DIAGNOSIS — R300 Dysuria: Secondary | ICD-10-CM | POA: Diagnosis not present

## 2024-08-12 DIAGNOSIS — E538 Deficiency of other specified B group vitamins: Secondary | ICD-10-CM

## 2024-08-12 DIAGNOSIS — E785 Hyperlipidemia, unspecified: Secondary | ICD-10-CM

## 2024-08-12 DIAGNOSIS — Z794 Long term (current) use of insulin: Secondary | ICD-10-CM

## 2024-08-12 DIAGNOSIS — F5101 Primary insomnia: Secondary | ICD-10-CM

## 2024-08-12 DIAGNOSIS — E119 Type 2 diabetes mellitus without complications: Secondary | ICD-10-CM | POA: Diagnosis not present

## 2024-08-12 DIAGNOSIS — E1169 Type 2 diabetes mellitus with other specified complication: Secondary | ICD-10-CM

## 2024-08-12 DIAGNOSIS — R82998 Other abnormal findings in urine: Secondary | ICD-10-CM

## 2024-08-12 LAB — POCT URINALYSIS DIP (CLINITEK)
Bilirubin, UA: NEGATIVE
Blood, UA: NEGATIVE
Glucose, UA: NEGATIVE mg/dL
Ketones, POC UA: NEGATIVE mg/dL
Nitrite, UA: NEGATIVE
POC PROTEIN,UA: NEGATIVE
Spec Grav, UA: 1.015 (ref 1.010–1.025)
Urobilinogen, UA: 0.2 U/dL
pH, UA: 6.5 (ref 5.0–8.0)

## 2024-08-12 LAB — POCT GLYCOSYLATED HEMOGLOBIN (HGB A1C): Hemoglobin A1C: 8.2 % — AB (ref 4.0–5.6)

## 2024-08-12 MED ORDER — NOVOLOG FLEXPEN 100 UNIT/ML ~~LOC~~ SOPN
10.0000 [IU] | PEN_INJECTOR | Freq: Three times a day (TID) | SUBCUTANEOUS | 1 refills | Status: AC
Start: 2024-08-12 — End: ?

## 2024-08-12 MED ORDER — BELSOMRA 10 MG PO TABS: 10.0000 mg | ORAL_TABLET | Freq: Every evening | ORAL | 0 refills | Status: AC | PRN

## 2024-08-12 NOTE — Assessment & Plan Note (Signed)
 It I very important that she see a psychiatrist for her medications and she needs a therapist to help her deal with her daughter,  She has a referral to [facility].

## 2024-08-12 NOTE — Assessment & Plan Note (Signed)
 She stopped the Ambien  because of decreased cognition.  Belsomra  5mg  was not effective.  Will increase to 10 mg nightly

## 2024-08-12 NOTE — Assessment & Plan Note (Addendum)
 Needs to tighten up on diet as A1c was 8.2% today.  Checking CMP, CBC. Lipid profile, UMACR.  Goal is LDL less that 70.

## 2024-08-12 NOTE — Assessment & Plan Note (Signed)
 May have a UTI.  Trace leukocytes in her urine.  Well culture.  No fever or suprapubic pain.  No hematuria.  No empiric ABX, will wait for culture

## 2024-08-12 NOTE — Progress Notes (Signed)
 Established Patient Office Visit  Subjective   Patient ID: [name], female    DOB: 04/20/1946  Age: 78 y.o. MRN: [id number]  Chief Complaint  Patient presents with   Urinary Tract Infection    HPI [name] with HTN, DMT2 (A1c 7.7% 05/10/2024,  on Lantus , insulin  SS and Ozempic ) HFpEF, mixed hyperlipidemia, CKD (stage IIIa), depression/GAD, OSA (untreated), CAD (s/p PCI to LAD), hypothyroidism, GERD, B12 deficiency, s/p B TKR, lumbar laminectomy syndrome, Hx hyponatremia, She was admitted to [facility] 11/8 through 09/16/2023 with 2 weeks of dyspnea and edema. Echo LVEF greater than 50%, BNP 19, troponin negative x 2 but she was found to be hypoxic in the low 80s and hypotensive. She had a low cortisol. 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  Creatinine was 1.7 considered AKI on CKD as her baseline is 1.1-1.3. Also had mild hyponatremia that resolved. She was admitted to hospital 02/22/2024 with AMS  secondary to polypharmacy, likely Flexeril .    Discussed the use of AI scribe software for clinical note transcription with the patient, who gave verbal consent to proceed.  History of Present Illness   [name] is a 78 year old female who presents for medication management and follow-up.  She experiences chronic insomnia and has previously used Ambien , which she discontinued due to decreased cognition.  She tried Belsomra  at 5 mg, which was ineffective, and wants to return to Seroquel , which she previously took at 200 mg.  She has diabetes with a recent A1c today of 8.2%. She acknowledges not eating properly and is currently taking Novolin 10 units with meals and semaglutide  50 units twice a day.  She describes a recent urinary tract infection (UTI) and reports increasing her water  intake. She has experienced cloudy urine and reports seeing white things in her urine. No fever but mentions back pain, which she attributes to a bulging disc diagnosed a  month ago.  She denies hematuria but has dysuria.  She would like pyridium.    She has a history of back pain due to a bulging disc, confirmed by an x-ray at a pain clinic. She describes significant pain impacting her daily life and mentions a history of working on an [employer], which she believes contributed to her condition.  She is concerned about her daughter, who is living on the street and has a history of substance abuse. She expresses significant stress and worry about her daughter's well-being, noting her daughter's weight loss and previous overdose incidents.  No chest pain or leg swelling. She mentions having a stent in her heart and is not experiencing any current symptoms related to her heart condition.           Objective:     BP 130/74 (BP Location: Right Arm, Patient Position: Sitting, Cuff Size: Normal)   Pulse (!) 112   Temp 97.9 F (36.6 C) (Oral)   Resp 18   Ht 5' 4 (1.626 m)   Wt 239 lb (108.4 kg)   SpO2 97%   BMI 41.02 kg/m    Physical Exam Vitals and nursing note reviewed.  Constitutional:      Appearance: Normal appearance.  HENT:     Head: Normocephalic and atraumatic.  Eyes:     Conjunctiva/sclera: Conjunctivae normal.  Cardiovascular:     Rate and Rhythm: Normal rate and regular rhythm.  Pulmonary:     Effort: Pulmonary effort is normal.     Breath sounds: Normal breath  sounds.  Musculoskeletal:     Right lower leg: No edema.     Left lower leg: No edema.  Skin:    General: Skin is warm and dry.  Neurological:     Mental Status: She is alert and oriented to person, place, and time.  Psychiatric:        Mood and Affect: Mood normal.        Behavior: Behavior normal.        Thought Content: Thought content normal.        Judgment: Judgment normal.          Results for orders placed or performed in visit on 08/12/24  POCT glycosylated hemoglobin (Hb A1C)  Result Value Ref Range   Hemoglobin A1C 8.2 (A) 4.0 - 5.6 %   HbA1c POC  (<> result, manual entry)     HbA1c, POC (prediabetic range)     HbA1c, POC (controlled diabetic range)    POCT URINALYSIS DIP (CLINITEK)  Result Value Ref Range   Color, UA yellow yellow   Clarity, UA cloudy (A) clear   Glucose, UA negative negative mg/dL   Bilirubin, UA negative negative   Ketones, POC UA negative negative mg/dL   Spec Grav, UA 8.984 8.989 - 1.025   Blood, UA negative negative   pH, UA 6.5 5.0 - 8.0   POC PROTEIN,UA negative negative, trace   Urobilinogen, UA 0.2 0.2 or 1.0 E.U./dL   Nitrite, UA Negative Negative   Leukocytes, UA Small (1+) (A) Negative      The 10-year ASCVD risk score (Arnett DK, et al., 2019) is: 43.1%    Assessment & Plan:  Dysuria -     POCT URINALYSIS DIP (CLINITEK)  Insulin  dependent type 2 diabetes mellitus (HCC) Assessment & Plan: Needs to tighten up on diet as A1c was 8.2% today.  Checking CMP, CBC. Lipid profile, UMACR.  Goal is LDL less that 70.    Orders: -     POCT glycosylated hemoglobin (Hb A1C)  Leukocytes in urine -     Urine Culture -     CBC with Differential/Platelet  Immunization due -     Flu vaccine HIGH DOSE PF(Fluzone Trivalent)  Type 2 diabetes mellitus with hyperlipidemia (HCC) -     NovoLOG  FlexPen; Inject 10 Units into the skin 3 (three) times daily with meals.  Dispense: 15 mL; Refill: 1 -     Microalbumin / creatinine urine ratio  Primary insomnia Assessment & Plan: She stopped the Ambien  because of decreased cognition.  Belsomra  5mg  was not effective.  Will increase to 10 mg nightly  Orders: -     Belsomra ; Take 1 tablet (10 mg total) by mouth at bedtime as needed.  Dispense: 30 tablet; Refill: 0  Hyperlipidemia associated with type 2 diabetes mellitus (HCC) -     Lipid panel -     Comprehensive metabolic panel with GFR  Severe episode of recurrent major depressive disorder, without psychotic features [facility]) Assessment & Plan: It I very important that she see a psychiatrist for her  medications and she needs a therapist to help her deal with her daughter,  She has a referral to [facility].     Acute cystitis without hematuria Assessment & Plan: May have a UTI.  Trace leukocytes in her urine.  Well culture.  No fever or suprapubic pain.  No hematuria.  No empiric ABX, will wait for culture      Return in about 4 weeks (around  09/09/2024).    Shandricka Monroy K Sundee Garland, MD

## 2024-08-14 LAB — URINE CULTURE

## 2024-08-16 ENCOUNTER — Ambulatory Visit: Payer: Self-pay | Admitting: Family Medicine

## 2024-08-16 ENCOUNTER — Ambulatory Visit: Admitting: Family Medicine

## 2024-08-18 LAB — COMPREHENSIVE METABOLIC PANEL WITH GFR
ALT: 15 IU/L (ref 0–32)
AST: 18 IU/L (ref 0–40)
Albumin: 3.9 g/dL (ref 3.8–4.8)
Alkaline Phosphatase: 78 IU/L (ref 49–135)
BUN/Creatinine Ratio: 13 (ref 12–28)
BUN: 15 mg/dL (ref 8–27)
Bilirubin Total: 0.6 mg/dL (ref 0.0–1.2)
CO2: 20 mmol/L (ref 20–29)
Calcium: 9.2 mg/dL (ref 8.7–10.3)
Chloride: 100 mmol/L (ref 96–106)
Creatinine, Ser: 1.17 mg/dL — ABNORMAL HIGH (ref 0.57–1.00)
Globulin, Total: 2.5 g/dL (ref 1.5–4.5)
Glucose: 178 mg/dL — ABNORMAL HIGH (ref 70–99)
Potassium: 4.9 mmol/L (ref 3.5–5.2)
Sodium: 139 mmol/L (ref 134–144)
Total Protein: 6.4 g/dL (ref 6.0–8.5)
eGFR: 48 mL/min/1.73 — ABNORMAL LOW (ref >59)

## 2024-08-18 LAB — MICROALBUMIN / CREATININE URINE RATIO

## 2024-08-18 LAB — LIPID PANEL
Chol/HDL Ratio: 3.4 ratio (ref 0.0–4.4)
Cholesterol, Total: 141 mg/dL (ref 100–199)
HDL: 42 mg/dL (ref >39)
LDL Chol Calc (NIH): 53 mg/dL (ref 0–99)
Triglycerides: 297 mg/dL — ABNORMAL HIGH (ref 0–149)
VLDL Cholesterol Cal: 46 mg/dL — ABNORMAL HIGH (ref 5–40)

## 2024-08-18 LAB — CBC WITH DIFFERENTIAL/PLATELET
Basophils Absolute: 0.1 x10E3/uL (ref 0.0–0.2)
Basos: 1 %
EOS (ABSOLUTE): 0.2 x10E3/uL (ref 0.0–0.4)
Eos: 2 %
Hematocrit: 40.6 % (ref 34.0–46.6)
Hemoglobin: 12.5 g/dL (ref 11.1–15.9)
Immature Grans (Abs): 0 x10E3/uL (ref 0.0–0.1)
Immature Granulocytes: 0 %
Lymphocytes Absolute: 2.2 x10E3/uL (ref 0.7–3.1)
Lymphs: 30 %
MCH: 30 pg (ref 26.6–33.0)
MCHC: 30.8 g/dL — ABNORMAL LOW (ref 31.5–35.7)
MCV: 97 fL (ref 79–97)
Monocytes Absolute: 0.5 x10E3/uL (ref 0.1–0.9)
Monocytes: 7 %
Neutrophils Absolute: 4.4 x10E3/uL (ref 1.4–7.0)
Neutrophils: 60 %
Platelets: 135 x10E3/uL — ABNORMAL LOW (ref 150–450)
RBC: 4.17 x10E6/uL (ref 3.77–5.28)
RDW: 14.2 % (ref 11.7–15.4)
WBC: 7.3 x10E3/uL (ref 3.4–10.8)

## 2024-09-13 ENCOUNTER — Ambulatory Visit: Admitting: Family Medicine

## 2024-09-14 ENCOUNTER — Ambulatory Visit

## 2024-10-20 ENCOUNTER — Ambulatory Visit: Admitting: Family Medicine

## 2024-10-20 ENCOUNTER — Encounter: Payer: Self-pay | Admitting: Family Medicine

## 2024-10-20 VITALS — BP 133/85 | HR 119 | Temp 97.8°F | Ht 64.0 in | Wt 232.2 lb

## 2024-10-20 DIAGNOSIS — F411 Generalized anxiety disorder: Secondary | ICD-10-CM | POA: Diagnosis not present

## 2024-10-20 DIAGNOSIS — E538 Deficiency of other specified B group vitamins: Secondary | ICD-10-CM

## 2024-10-20 DIAGNOSIS — N3000 Acute cystitis without hematuria: Secondary | ICD-10-CM | POA: Diagnosis not present

## 2024-10-20 DIAGNOSIS — F5101 Primary insomnia: Secondary | ICD-10-CM

## 2024-10-20 DIAGNOSIS — F321 Major depressive disorder, single episode, moderate: Secondary | ICD-10-CM | POA: Diagnosis not present

## 2024-10-20 DIAGNOSIS — F419 Anxiety disorder, unspecified: Secondary | ICD-10-CM | POA: Diagnosis not present

## 2024-10-20 DIAGNOSIS — R110 Nausea: Secondary | ICD-10-CM | POA: Diagnosis not present

## 2024-10-20 MED ORDER — VILAZODONE HCL 40 MG PO TABS: 40.0000 mg | ORAL_TABLET | Freq: Every day | ORAL | 1 refills | Status: AC

## 2024-10-20 MED ORDER — BUSPIRONE HCL 5 MG PO TABS: 5.0000 mg | ORAL_TABLET | Freq: Two times a day (BID) | ORAL | 3 refills | Status: DC

## 2024-10-20 MED ORDER — PROMETHAZINE HCL 25 MG PO TABS: 25.0000 mg | ORAL_TABLET | Freq: Three times a day (TID) | ORAL | 2 refills | Status: AC | PRN

## 2024-10-20 MED ORDER — DOXEPIN HCL 3 MG PO TABS: 3.0000 mg | ORAL_TABLET | Freq: Every day | ORAL | 1 refills | Status: DC

## 2024-10-20 NOTE — Progress Notes (Unsigned)
 Established Patient Office Visit  Subjective   Patient ID: [name], female    DOB: [birth year]  Age: 78 y.o. MRN: [id number]  Chief Complaint  Patient presents with   Follow-up   Medication Refill   Anxiety    HPI Discussed the use of AI scribe software for clinical note transcription with the patient, who gave verbal consent to proceed.  History of Present Illness   [name] is a 78 year old female who presents with insomnia and anxiety.  She experiences significant insomnia, characterized by difficulty sleeping and nightmares, severely impacting her sleep quality. She stays awake until 3 AM. She was supposed to restart  Seroquel  200 mg but never received it and was instead prescribed Belsomra  by her psychiatrist, which she does not prefer.  She reports that her anxiety is severe, related to her home life and other unspecified issues, leading to increased eating without weight gain. Her blood sugar levels have been elevated, around 300 mg/dL in the morning, which she attributes to her nerves and dietary habits. She has a history of using Xanax and gabapentin  for anxiety but is concerned about the risk of falls. She wants a medication like Buspar , which she has tried before. Her PHQ9 score is 18 and her GAD 7 score is 13.  She does not have an appointment with psychiatry.  Her psychiatrist retired and has not heard from any other office.    She suspects a urinary tract infection due to symptoms of frequent urination, foul-smelling urine, and chills during urination, although she is unable to provide a urine specimen at this time.  She experiences persistent back pain and requires refills for her medications, including phenergan  for nausea and Viibryd , which she has been without for four days. She also mentions upcoming dental work in January and February, contributing to her stress.  She has received her flu shot but not the pneumonia or COVID vaccines and expresses interest  in a B12 shot.   No swelling in her legs and reports no stairs in her house,     Objective:     BP 133/85   Pulse (!) 119   Temp 97.8 F (36.6 C) (Oral)   Ht 5' 4 (1.626 m)   Wt 232 lb 4 oz (105.3 kg)   SpO2 95%   BMI 39.87 kg/m    Physical Exam Vitals and nursing note reviewed.  Constitutional:      Appearance: Normal appearance.  HENT:     Head: Normocephalic and atraumatic.  Eyes:     Conjunctiva/sclera: Conjunctivae normal.  Cardiovascular:     Rate and Rhythm: Normal rate and regular rhythm.  Pulmonary:     Effort: Pulmonary effort is normal.     Breath sounds: Normal breath sounds.  Musculoskeletal:     Right lower leg: No edema.     Left lower leg: No edema.  Skin:    General: Skin is warm and dry.  Neurological:     Mental Status: She is alert and oriented to person, place, and time.  Psychiatric:        Mood and Affect: Mood normal.        Behavior: Behavior normal.        Thought Content: Thought content normal.        Judgment: Judgment normal.          No results found for any visits on 10/20/24.    The 10-year ASCVD risk score (Arnett DK, et  al., 2019) is: 45.2%    Assessment & Plan:  Anxiety -     busPIRone  HCl; Take 1 tablet (5 mg total) by mouth 2 (two) times daily.  Dispense: 90 tablet; Refill: 3 -     Ambulatory referral to Psychiatry  Nausea -     Promethazine  HCl; Take 1 tablet (25 mg total) by mouth every 8 (eight) hours as needed for nausea or vomiting.  Dispense: 30 tablet; Refill: 2  Current moderate episode of major depressive disorder, unspecified whether recurrent (HCC) -     Vilazodone  HCl; Take 1 tablet (40 mg total) by mouth daily.  Dispense: 90 tablet; Refill: 1  Primary insomnia Assessment & Plan: Is not sleeping very well.  Has Belsomra  which has not helped at all.  Will trial doxepin  3 mg nightly.  Has anticholinergic side effects.  Orders: -     Doxepin  HCl; Take 1 tablet (3 mg total) by mouth daily.   Dispense: 30 tablet; Refill: 1  B12 deficiency  Acute cystitis without hematuria Assessment & Plan: Feels like she has got a bladder infection now.  She was unable to give us  a urine sample.  Will try Macrodantin  100 mg twice daily for a week  Orders: -     Nitrofurantoin  Monohyd Macro; Take 1 capsule (100 mg total) by mouth 2 (two) times daily.  Dispense: 14 capsule; Refill: 0  GAD (generalized anxiety disorder) Assessment & Plan: Reports her anxiety is very high right now refilled her Viibryd  and BuSpar  5 mg twice daily.      Return in about 4 weeks (around 11/17/2024).    Jeniya Flannigan K Nessa Ramaker, MD

## 2024-10-21 MED ORDER — NITROFURANTOIN MONOHYD MACRO 100 MG PO CAPS: 100.0000 mg | ORAL_CAPSULE | Freq: Two times a day (BID) | ORAL | 0 refills | Status: AC

## 2024-10-21 NOTE — Assessment & Plan Note (Signed)
 Reports her anxiety is very high right now refilled her Viibryd  and BuSpar  5 mg twice daily.

## 2024-10-21 NOTE — Assessment & Plan Note (Signed)
 Feels like she has got a bladder infection now.  She was unable to give us  a urine sample.  Will try Macrodantin  100 mg twice daily for a week

## 2024-10-21 NOTE — Assessment & Plan Note (Signed)
 Is not sleeping very well.  Has Belsomra  which has not helped at all.  Will trial doxepin  3 mg nightly.  Has anticholinergic side effects.

## 2024-10-22 ENCOUNTER — Telehealth: Payer: Self-pay | Admitting: Family Medicine

## 2024-10-22 NOTE — Telephone Encounter (Signed)
 Copied from CRM #[id number]. Topic: Clinical - Prescription Issue >> Oct 22, 2024 11:01 AM Rosina BIRCH wrote: Reason for CRM: patient daughter called stating the patient need prior authorization for Doxepin 

## 2024-10-25 ENCOUNTER — Telehealth: Payer: Self-pay

## 2024-10-25 ENCOUNTER — Other Ambulatory Visit (HOSPITAL_COMMUNITY): Payer: Self-pay

## 2024-10-25 NOTE — Telephone Encounter (Signed)
 Pharmacy Patient Advocate Encounter   Received notification from Physician's Office that prior authorization for Doxepin  HCl 3MG  tablets is required/requested.   Insurance verification completed.   The patient is insured through [place].   Per test claim: PA required; PA submitted to above mentioned insurance via Latent Key/confirmation #/EOC [id number] Status is pending

## 2024-10-26 ENCOUNTER — Ambulatory Visit: Payer: Self-pay

## 2024-10-26 ENCOUNTER — Other Ambulatory Visit: Payer: Self-pay | Admitting: Family Medicine

## 2024-10-26 ENCOUNTER — Telehealth: Payer: Self-pay | Admitting: Family Medicine

## 2024-10-26 DIAGNOSIS — F419 Anxiety disorder, unspecified: Secondary | ICD-10-CM

## 2024-10-26 MED ORDER — ALPRAZOLAM 0.5 MG PO TABS: 0.5000 mg | ORAL_TABLET | Freq: Two times a day (BID) | ORAL | 0 refills | Status: DC | PRN

## 2024-10-26 NOTE — Telephone Encounter (Signed)
 Routing to Dr. Ziglar for review.

## 2024-10-26 NOTE — Telephone Encounter (Signed)
 FYI Only or Action Required?: Action required by provider: clinical question for provider and requesting medication to help with anxiety for as needed situations.  Patient was last seen in primary care on 10/20/2024 by Ziglar, Susan K, MD.  Called Nurse Triage reporting Anxiety.  Symptoms began a week ago.  Interventions attempted: Rest, hydration, or home remedies.  Symptoms are: unchanged.  Triage Disposition: See Physician Within 24 Hours  Patient/caregiver understands and will follow disposition?: No, wishes to speak with PCP  Copied from CRM #[id number]. Topic: Clinical - Red Word Triage >> Oct 26, 2024 10:10 AM Adelita E wrote: [facility] that prompted transfer to Nurse Triage: Worsening anxiety, affecting sleep. Reason for Disposition  Patient sounds very upset or troubled to the triager  Answer Assessment - Initial Assessment Questions Patient is requesting medication to help with her nerves. Patient is requesting medication to help on an as needed basis. Requesting a call back from PCP staff.   1. CONCERN: Did anything happen that prompted you to call today?      Patient reports my nerves are so bad today I threw up in an uber today because of my nerves. 2. ANXIETY SYMPTOMS: Can you describe how you (your loved one; patient) have been feeling? (e.g., tense, restless, panicky, anxious, keyed up, overwhelmed, sense of impending doom).      anxious 3. ONSET: How long have you been feeling this way? (e.g., hours, days, weeks)     Increased anxiety started in the last week. 4. SEVERITY: How would you rate the level of anxiety? (e.g., 0 - 10; or mild, moderate, severe).     8 out of 10 5. FUNCTIONAL IMPAIRMENT: How have these feelings affected your ability to do daily activities? Have you had more difficulty than usual doing your normal daily activities? 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 HISTORY: Have you felt this way before? Have you ever been diagnosed with an anxiety problem in the past? (e.g., generalized anxiety disorder, panic attacks, PTSD). If Yes, ask: How was this problem treated? (e.g., medicines, counseling, etc.)     yes 7. RISK OF HARM - SUICIDAL IDEATION: Do you ever have thoughts of hurting or killing yourself? If Yes, ask:  Do you have these feelings now? Do you have a plan on how you would do this?     no 8. TREATMENT:  What has been done so far to treat this anxiety? (e.g., medicines, relaxation strategies). What has helped?     medications 9. THERAPIST: Do you have a counselor or therapist? If Yes, ask: What is their name?     Was seeing someone but they moved away 10. POTENTIAL TRIGGERS: Do you drink caffeinated beverages (e.g., coffee, colas, teas), and how much daily? Do you drink alcohol or use any drugs? Have you started any new medicines recently?       One cup of caffeine at the most 11. PATIENT SUPPORT: Who is with you now? Who do you live with? Do you have family or friends who you can talk to?        Lives with daughter 2. OTHER SYMPTOMS: Do you have any other symptoms? (e.g., feeling depressed, trouble concentrating, trouble sleeping, trouble breathing, palpitations or fast heartbeat, chest pain, sweating, nausea, or diarrhea)       Trouble sleeping, concentrating  Protocols used: Anxiety and Panic Attack-A-AH

## 2024-10-26 NOTE — Telephone Encounter (Signed)
 Spoke with patient.  She has nausea and is vomiting from her anxiety.  Buspar  is not working for her.  She has taken xanax  since she was 78 years old.  She has not had any xanax  since her psychiatrist left.  Her daughter who is a drug addict is living with her again

## 2024-10-27 ENCOUNTER — Other Ambulatory Visit (HOSPITAL_COMMUNITY): Payer: Self-pay

## 2024-10-27 NOTE — Telephone Encounter (Signed)
 See below

## 2024-10-27 NOTE — Telephone Encounter (Signed)
 Pharmacy Patient Advocate Encounter  Received notification from [employer] that Prior Authorization for Doxepin  HCl 3MG  tablets has been APPROVED from 11/05/2023 to 11/03/2025. Ran test claim, Copay is $0. This test claim was processed through [facility]- copay amounts may vary at other pharmacies due to pharmacy/plan contracts, or as the patient moves through the different stages of their insurance plan.   PA #/Case ID/Reference #: [id number]

## 2024-11-17 ENCOUNTER — Emergency Department
Admission: EM | Admit: 2024-11-17 | Discharge: 2024-11-18 | Disposition: A | Discharge status: Home / Self Care | Attending: Emergency Medicine | Admitting: Emergency Medicine

## 2024-11-17 ENCOUNTER — Telehealth: Payer: Self-pay | Admitting: *Deleted

## 2024-11-17 ENCOUNTER — Emergency Department

## 2024-11-17 ENCOUNTER — Encounter: Payer: Self-pay | Admitting: Family Medicine

## 2024-11-17 ENCOUNTER — Ambulatory Visit (INDEPENDENT_AMBULATORY_CARE_PROVIDER_SITE_OTHER): Admitting: Family Medicine

## 2024-11-17 VITALS — BP 94/65 | HR 121 | Temp 98.0°F | Resp 17 | Wt 232.6 lb

## 2024-11-17 DIAGNOSIS — R531 Weakness: Secondary | ICD-10-CM | POA: Diagnosis not present

## 2024-11-17 DIAGNOSIS — E860 Dehydration: Secondary | ICD-10-CM | POA: Diagnosis not present

## 2024-11-17 DIAGNOSIS — N3000 Acute cystitis without hematuria: Secondary | ICD-10-CM | POA: Insufficient documentation

## 2024-11-17 DIAGNOSIS — F419 Anxiety disorder, unspecified: Secondary | ICD-10-CM | POA: Diagnosis not present

## 2024-11-17 DIAGNOSIS — F5101 Primary insomnia: Secondary | ICD-10-CM

## 2024-11-17 DIAGNOSIS — R0600 Dyspnea, unspecified: Secondary | ICD-10-CM | POA: Insufficient documentation

## 2024-11-17 DIAGNOSIS — I110 Hypertensive heart disease with heart failure: Secondary | ICD-10-CM | POA: Diagnosis not present

## 2024-11-17 DIAGNOSIS — M791 Myalgia, unspecified site: Secondary | ICD-10-CM | POA: Diagnosis not present

## 2024-11-17 DIAGNOSIS — F332 Major depressive disorder, recurrent severe without psychotic features: Secondary | ICD-10-CM

## 2024-11-17 DIAGNOSIS — E538 Deficiency of other specified B group vitamins: Secondary | ICD-10-CM

## 2024-11-17 DIAGNOSIS — I509 Heart failure, unspecified: Secondary | ICD-10-CM | POA: Diagnosis not present

## 2024-11-17 DIAGNOSIS — R0602 Shortness of breath: Secondary | ICD-10-CM | POA: Diagnosis not present

## 2024-11-17 DIAGNOSIS — R300 Dysuria: Secondary | ICD-10-CM | POA: Diagnosis present

## 2024-11-17 LAB — CBC
HCT: 41.2 % (ref 36.0–46.0)
Hemoglobin: 13.4 g/dL (ref 12.0–15.0)
MCH: 29.6 pg (ref 26.0–34.0)
MCHC: 32.5 g/dL (ref 30.0–36.0)
MCV: 90.9 fL (ref 80.0–100.0)
Platelets: 128 K/uL — ABNORMAL LOW (ref 150–400)
RBC: 4.53 MIL/uL (ref 3.87–5.11)
RDW: 13.6 % (ref 11.5–15.5)
WBC: 7.7 K/uL (ref 4.0–10.5)
nRBC: 0 % (ref 0.0–0.2)

## 2024-11-17 LAB — COMPREHENSIVE METABOLIC PANEL WITH GFR
ALT: 18 U/L (ref 0–44)
AST: 24 U/L (ref 15–41)
Albumin: 4 g/dL (ref 3.5–5.0)
Alkaline Phosphatase: 74 U/L (ref 38–126)
Anion gap: 11 (ref 5–15)
BUN: 18 mg/dL (ref 8–23)
CO2: 26 mmol/L (ref 22–32)
Calcium: 9.8 mg/dL (ref 8.9–10.3)
Chloride: 103 mmol/L (ref 98–111)
Creatinine, Ser: 1.03 mg/dL — ABNORMAL HIGH (ref 0.44–1.00)
GFR, Estimated: 56 mL/min — ABNORMAL LOW
Glucose, Bld: 173 mg/dL — ABNORMAL HIGH (ref 70–99)
Potassium: 4.2 mmol/L (ref 3.5–5.1)
Sodium: 139 mmol/L (ref 135–145)
Total Bilirubin: 0.5 mg/dL (ref 0.0–1.2)
Total Protein: 6.8 g/dL (ref 6.5–8.1)

## 2024-11-17 LAB — RESP PANEL BY RT-PCR (RSV, FLU A&B, COVID)  RVPGX2
Influenza A by PCR: NEGATIVE
Influenza B by PCR: NEGATIVE
Resp Syncytial Virus by PCR: NEGATIVE
SARS Coronavirus 2 by RT PCR: NEGATIVE

## 2024-11-17 LAB — URINALYSIS, ROUTINE W REFLEX MICROSCOPIC
Bilirubin Urine: NEGATIVE
Glucose, UA: NEGATIVE mg/dL
Hgb urine dipstick: NEGATIVE
Ketones, ur: NEGATIVE mg/dL
Nitrite: NEGATIVE
Protein, ur: NEGATIVE mg/dL
Specific Gravity, Urine: 1.012 (ref 1.005–1.030)
pH: 5 (ref 5.0–8.0)

## 2024-11-17 MED ORDER — ALPRAZOLAM 0.5 MG PO TABS: 0.5000 mg | ORAL_TABLET | Freq: Two times a day (BID) | ORAL | 0 refills | Status: DC | PRN

## 2024-11-17 MED ORDER — CYANOCOBALAMIN 1000 MCG/ML IJ SOLN
1000.0000 ug | Freq: Once | INTRAMUSCULAR | Status: AC
  Administered 2024-11-17: 1000 ug via INTRAMUSCULAR

## 2024-11-17 MED ORDER — SODIUM CHLORIDE 0.9 % IV BOLUS
1000.0000 mL | Freq: Once | INTRAVENOUS | Status: AC
  Administered 2024-11-17: 1000 mL via INTRAVENOUS

## 2024-11-17 MED ORDER — OXYCODONE-ACETAMINOPHEN 5-325 MG PO TABS
1.0000 | ORAL_TABLET | Freq: Once | ORAL | Status: AC
  Administered 2024-11-17: 1 via ORAL
  Filled 2024-11-17: qty 1

## 2024-11-17 MED ORDER — OXYCODONE HCL 5 MG PO TABS: 5.0000 mg | ORAL_TABLET | Freq: Once | ORAL | Status: DC

## 2024-11-17 MED ORDER — CEPHALEXIN 500 MG PO CAPS: 500.0000 mg | ORAL_CAPSULE | Freq: Four times a day (QID) | ORAL | 0 refills | Status: AC

## 2024-11-17 MED ORDER — DOXEPIN HCL 3 MG PO TABS: 3.0000 mg | ORAL_TABLET | Freq: Every day | ORAL | 1 refills | Status: AC

## 2024-11-17 MED ORDER — SODIUM CHLORIDE 0.9 % IV SOLN
2.0000 g | Freq: Once | INTRAVENOUS | Status: AC
  Administered 2024-11-17: 2 g via INTRAVENOUS
  Filled 2024-11-17: qty 20

## 2024-11-17 NOTE — Telephone Encounter (Signed)
 Will be on the lookout for fax.

## 2024-11-17 NOTE — ED Provider Triage Note (Signed)
 Emergency Medicine Provider Triage Evaluation Note  [name] , a 79 y.o. female  was evaluated in triage.  Pt complains of weakness and hurting all over. Was at doctor's office and was hypotensive.  EMS reports hx of DM.  CBG 229.  Review of Systems  Positive: + vomiting, bodyaches, ? Uti sx Negative: No diarrhea.   Physical Exam  Pulse (!) 112   Temp 98.3 F (36.8 C) (Oral)   Resp 18   Ht 5' 4 (1.626 m)   Wt 105 kg   SpO2 96%   BMI 39.73 kg/m  Gen:   Awake, no distress  alert, talkative Resp:  Normal effort  lungs clear bilat.  MSK:   Moves extremities without difficulty  Other:    Medical Decision Making  Medically screening exam initiated at 2:37 PM.  Appropriate orders placed.  [name] Cheese was informed that the remainder of the evaluation will be completed by another provider, this initial triage assessment does not replace that evaluation, and the importance of remaining in the ED until their evaluation is complete.     Saunders Whitney LITTIE, PA-C 11/17/24 1440

## 2024-11-17 NOTE — Progress Notes (Signed)
 Patient is in office today for a nurse visit for B12 Injection. Patient Injection was given in the  Right deltoid. Patient tolerated injection well.

## 2024-11-17 NOTE — ED Triage Notes (Signed)
 Pt presents to the ED via [facility] from [facility] in [place]. EMS was called out for [facility]. Pt's O2 saturation was 92% on RA. BP at PCP was . Pt reports SHOB with exertion for a couple of weeks, but states that she was just there today for a regular check up. PCP office gave a BP of 94/65. Pt reports generalized weakness and full body aches.  CBG 229 - hx of DM 110/68 HR 106

## 2024-11-17 NOTE — ED Provider Notes (Signed)
 "   [facility] Emergency Department Provider Note     Event Date/Time   First MD Initiated Contact with Patient 11/17/24 1717     (approximate)   History   Shortness of Breath   HPI  [name] is a 79 y.o. female with a history of HLD, HTN, CHF, OSA, and depression, who presents to the ED endorsing some shortness of breath.  Patient was at her routine PCP office, for evaluation when EMS was called out to the patient's reported shortness of breath.  She has been endorsing some dyspnea on exertion of the last few weeks, but [place].  She was found to have a decreased BP at 94/65 in the office as well as reports generalized weakness.  She also endorsed body aches, and some dysuria, concerning to her for UTI.  Patient denies any frank fever, urinary retention, hematuria.  She also denies any frank cough, congestion, or chest pain.     Physical Exam   Triage Vital Signs: ED Triage Vitals [11/17/24 1434]  Encounter Vitals Group     BP 98/74     Girls Systolic BP Percentile      Girls Diastolic BP Percentile      Boys Systolic BP Percentile      Boys Diastolic BP Percentile      Pulse Rate (!) 112     Resp 18     Temp 98.3 F (36.8 C)     Temp Source Oral     SpO2 96 %     Weight 231 lb 7.7 oz (105 kg)     Height 5' 4 (1.626 m)     Head Circumference      Peak Flow      Pain Score 8     Pain Loc      Pain Education      Exclude from Growth Chart     Most recent vital signs: Vitals:   11/17/24 1929 11/17/24 2155  BP: (!) 112/96   Pulse: 92   Resp: 20   Temp:  98 F (36.7 C)  SpO2: 96%     General Awake, no distress. NAD HEENT NCAT. PERRL. EOMI. No rhinorrhea. Mucous membranes are moist. RRR CV:  Good peripheral perfusion. CTA RESP:  Normal effort. CTA ABD:  No distention.  Nontender.  No rebound, guarding, or rigidity noted.  No CVA tenderness elicited.   ED Results / Procedures  / Treatments   Labs (all labs ordered are listed, but only abnormal results are displayed) Labs Reviewed  COMPREHENSIVE METABOLIC PANEL WITH GFR - Abnormal; Notable for the following components:      Result Value   Glucose, Bld 173 (*)    Creatinine, Ser 1.03 (*)    GFR, Estimated 56 (*)    All other components within normal limits  CBC - Abnormal; Notable for the following components:   Platelets 128 (*)    All other components within normal limits  URINALYSIS, ROUTINE W REFLEX MICROSCOPIC - Abnormal; Notable for the following components:   Color, Urine YELLOW (*)    APPearance HAZY (*)    Leukocytes,Ua TRACE (*)    Bacteria, UA RARE (*)    All other components within normal limits  RESP PANEL BY RT-PCR (RSV, FLU A&B, COVID)  RVPGX2  URINE CULTURE     EKG  Vent. rate 115 BPM . PR interval 182 ms  QRS duration 82  ms  QT/QTcB 350/484 ms  P-R-T axes 44 -27 4  Sinus tachycardia  No STEMI  RADIOLOGY  I personally viewed and evaluated these images as part of my medical decision making, as well as reviewing the written report by the radiologist.  ED Provider Interpretation: No acute findings  DG Chest 2 View Result Date: 11/17/2024 CLINICAL DATA:  Shortness of breath EXAM: CHEST - 2 VIEW COMPARISON:  February 18, 2024 FINDINGS: Stable cardiomediastinal silhouette. Elevated right hemidiaphragm. Both lungs are clear. The visualized skeletal structures are unremarkable. IMPRESSION: No active cardiopulmonary disease. Electronically Signed   By: Lynwood Landy Raddle M.D.   On: 11/17/2024 15:15    PROCEDURES:  Critical Care performed: No  Procedures   MEDICATIONS ORDERED IN ED: Medications  oxyCODONE  (Oxy IR/ROXICODONE ) immediate release tablet 5 mg (0 mg Oral Hold 11/17/24 1930)  sodium chloride  0.9 % bolus 1,000 mL (0 mLs Intravenous Stopped 11/17/24 2154)  oxyCODONE -acetaminophen  (PERCOCET/ROXICET) 5-325 MG per tablet 1 tablet (1 tablet Oral Given 11/17/24 1928)  cefTRIAXone   (ROCEPHIN ) 2 g in sodium chloride  0.9 % 100 mL IVPB (2 g Intravenous New Bag/Given 11/17/24 2154)     IMPRESSION / MDM / ASSESSMENT AND PLAN / ED COURSE  I reviewed the triage vital signs and the nursing notes.                              Differential diagnosis includes, but is not limited to, dehydration, electrolyte abnormality, UTI, sepsis, pneumonia, COVID, flu, RSV  Patient's presentation is most consistent with acute complicated illness / injury requiring diagnostic workup.  Patient's diagnosis is consistent with acute UTI.  Jerrett patient presented to the ED with generalized weakness, hypotension, and dysuria.  Workup was overall reassuring.  She was afebrile on presentation, with reassuring vital signs.  Patient received a fluid bolus in the ED, and her workup including blood work revealed no acute abnormalities.  Respiratory panel is negative, and chest x-ray shows no acute process.  UA confirms rare bacteria and leukocyturia.  Urine culture is pending at this time.  Patient was treated with an empiric dose of Keflex  in the ED.  She received a fluid bolus as well as her routine scheduled pain medicine.  Patient is endorsing significant proving of her pain overall.  We discussed admission for her ongoing UTI management, but patient feels confident she can manage her symptoms at home.  She is declining admission to the ED for further management.  Patient will be discharged home with prescriptions for Keflex . Patient is to follow up with her primary provider as needed or otherwise directed. Patient is given ED precautions to return to the ED for any worsening or new symptoms.     FINAL CLINICAL IMPRESSION(S) / ED DIAGNOSES   Final diagnoses:  Acute cystitis without hematuria     Rx / DC Orders   ED Discharge Orders          Ordered    cephALEXin  (KEFLEX ) 500 MG capsule  4 times daily        11/17/24 2303             Note:  This document was prepared using Dragon voice  recognition software and may include unintentional dictation errors.    Loyd Candida LULLA Aldona, PA-C 11/17/24 2315    Floy Roberts, MD 11/17/24 2332  "

## 2024-11-17 NOTE — Telephone Encounter (Signed)
 Called and spoke with pt letting her know that we had her walker at the office as once EMS took her, we realized that her walker was left in the exam room. Pt's walker is in our med room with a sign on it.  Stated to pt if she had someone that could come by the office to get it that we would be glad to give it to them for her and pt verbalized understanding and stated she would see what she could do.

## 2024-11-17 NOTE — Discharge Instructions (Addendum)
 Take the prescription antibiotic as directed.  Follow-up with your primary provider for ongoing evaluation.  You be notified by telephone if you are pending urine culture result, or requires a change to your antibiotic.  Follow-up with your primary provider for ongoing evaluation.  Return to the ED if needed.

## 2024-11-17 NOTE — Telephone Encounter (Signed)
 Copied from CRM [id number]. Topic: Clinical - Order For Equipment >> Nov 17, 2024 12:17 PM Delon DASEN wrote: Reason for CRM: [name] - expect a fax from [employer], they are requesting last office notes need by the end of the week, signed by doctor for shoulder brace

## 2024-11-17 NOTE — Telephone Encounter (Signed)
 Copied from CRM [id number]. Topic: General - Other >> Nov 17, 2024  3:43 PM Zebedee SAUNDERS wrote: Reason for CRM: Pt needs her walker, please call [phone]. It was left at office.
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
## 2024-11-17 NOTE — Assessment & Plan Note (Addendum)
 I checked her EKG and she is having sinus tachycardia with PVCs.  She appears to have acute dehydration with hypotension and tachycardia.  Blood pressure 90s over 60s and heart rate 121.  O2 sat 92% on room air.  Encouraged her to go to the hospital for gentle IV hydration.  She drinks almost exclusively green tea.  Suspected has caffeine and that is what caused her to be dehydrated.

## 2024-11-17 NOTE — ED Notes (Signed)
 Patient placed on 2 L [place] after oxygen saturation staying low 90's and occasionally dropping into the high 80's at rest. Patient endorsed feeling short of breath while laying on stretcher. MD Floy made aware.

## 2024-11-17 NOTE — Assessment & Plan Note (Signed)
 Gave her a B12 injection today

## 2024-11-17 NOTE — Assessment & Plan Note (Addendum)
 Depression seems to be worsening.  Has tremendous stress from a family situation.  I have asked her repeatedly to see a psychiatrist and she declines.

## 2024-11-19 ENCOUNTER — Other Ambulatory Visit: Payer: Self-pay | Admitting: Family Medicine

## 2024-11-19 ENCOUNTER — Ambulatory Visit: Admitting: Family Medicine

## 2024-11-19 ENCOUNTER — Telehealth (HOSPITAL_BASED_OUTPATIENT_CLINIC_OR_DEPARTMENT_OTHER): Payer: Self-pay | Admitting: *Deleted

## 2024-11-19 ENCOUNTER — Telehealth: Payer: Self-pay | Admitting: Family Medicine

## 2024-11-19 DIAGNOSIS — F419 Anxiety disorder, unspecified: Secondary | ICD-10-CM

## 2024-11-19 LAB — URINE CULTURE: Culture: NO GROWTH

## 2024-11-19 MED ORDER — ALPRAZOLAM 0.5 MG PO TABS: 0.5000 mg | ORAL_TABLET | Freq: Two times a day (BID) | ORAL | 0 refills | Status: DC | PRN

## 2024-11-19 NOTE — Telephone Encounter (Signed)
 Routing CRM about medication to PCP.

## 2024-11-19 NOTE — Telephone Encounter (Signed)
 Pt returned missed call. She is requesting a call back

## 2024-11-19 NOTE — Telephone Encounter (Signed)
 Copied from CRM [id number]. Topic: Clinical - Prescription Issue >> Nov 19, 2024 11:25 AM Larissa RAMAN wrote: Reason for CRM: Patient states pharmacy has not received prescription for ALPRAZolam  (XANAX ) 0.5 MG tablet [facility] [address], [place] - [address] [place] [address] Phone: [phone] Fax: [phone] Hours: Not open 24 hours

## 2024-11-19 NOTE — Telephone Encounter (Signed)
 Called but got no answer.  Could not leave a VM

## 2024-11-22 ENCOUNTER — Telehealth: Payer: Self-pay | Admitting: Family Medicine

## 2024-11-22 ENCOUNTER — Ambulatory Visit: Payer: Self-pay

## 2024-11-22 NOTE — Telephone Encounter (Signed)
 FYI Only or Action Required?: Action required by provider: referral request.  Patient was last seen in primary care on 11/17/2024 by Ziglar, Devere POUR, MD.  Called Nurse Triage reporting Depression, Anxiety, and Insomnia.  Symptoms began Ongoing, worsened 2 weeks ago.  Interventions attempted: Prescription medications: Doxepin  HCl and Xanax .  Symptoms are: stable.  Triage Disposition: See PCP When Office is Open (Within 3 Days)  Patient/caregiver understands and will follow disposition?: No, refuses disposition                                  1. CONCERN: Did anything happen that prompted you to call today?      Worsening nerves, depression and difficulty sleeping  3. ONSET: How long have you been feeling this way? (e.g., hours, days, weeks)     Worsened 2 weeks ago 6. HISTORY: Have you felt this way before? Have you ever been diagnosed with an anxiety problem in the past? (e.g., generalized anxiety disorder, panic attacks, PTSD). If Yes, ask: How was this problem treated? (e.g., medicines, counseling, etc.)     Yes 7. RISK OF HARM - SUICIDAL IDEATION: Do you ever have thoughts of hurting or killing yourself? If Yes, ask:  Do you have these feelings now? Do you have a plan on how you would do this?     Denies 8. TREATMENT:  What has been done so far to treat this anxiety? (e.g., medicines, relaxation strategies). What has helped?     Doxepin  HCl and Xanax , no relief 10. POTENTIAL TRIGGERS: Do you drink caffeinated beverages (e.g., coffee, colas, teas), and how much daily? Do you drink alcohol or use any drugs? Have you started any new medicines recently?     Daughter has been in the hospital and is wanting to stay with patient, patient reports this is causing her a lot of stress, as she has a difficult relationship with her daughter 19. OTHER SYMPTOMS: Do you have any other symptoms? (e.g., feeling depressed, trouble concentrating,  trouble sleeping, trouble breathing, palpitations or fast heartbeat, chest pain, sweating, nausea, or diarrhea)     Difficulty sleeping, reports she did not sleep last night    This RN advised in-person evaluation. Patient declined. Patient stated she has an upcoming appointment next month. Patient is requesting provider to call [facility] at this time. Patient stated she has not heard anything from them. Please advise.  Reason for Disposition  MODERATE anxiety (e.g., persistent or frequent anxiety symptoms; interferes with sleep, school, or work)  Protocols used: Anxiety and Panic Attack-A-AH  Reason for Triage: Seeking assistance for a referral to [facility], says she is in really bad shape. Difficulty sleeping, depression, and nerves. Has acute and chronic anxiety attacks. Unable to sleep.

## 2024-11-22 NOTE — Telephone Encounter (Signed)
 Called both numbers and someone picked up the phone but would not talk.  Tried both phone numbers.

## 2024-11-22 NOTE — Telephone Encounter (Signed)
 Pt called at 29 and Dr Ziglar was already gone for the day.

## 2024-11-23 ENCOUNTER — Other Ambulatory Visit: Payer: Self-pay | Admitting: Family Medicine

## 2024-11-23 ENCOUNTER — Telehealth: Payer: Self-pay | Admitting: Family Medicine

## 2024-11-23 DIAGNOSIS — F411 Generalized anxiety disorder: Secondary | ICD-10-CM

## 2024-11-23 NOTE — Progress Notes (Signed)
 This is an error.

## 2024-11-23 NOTE — Telephone Encounter (Signed)
 Spoke with [name] and she is very depressed.  No one from [employer] ever called her back about an appointment.  She agrees that she needs to see a psychiatrist for anxiety and depression.  Will set up referral with [facility].

## 2024-11-25 ENCOUNTER — Telehealth: Payer: Self-pay | Admitting: Family Medicine

## 2024-11-25 NOTE — Telephone Encounter (Signed)
 Copied from CRM #[id number]. Topic: Clinical - Order For Equipment >> Nov 25, 2024 12:28 PM Terri MATSU wrote: Reason for CRM: Georgina from [employer] is calling regarding patients brace, they have been faxing the request over since 1/9 , 1/14, 1/19 and again this morning. Verify fax number and they want to know if Dr.Ziglar received the fax. This is urgent request. They just need the paperwork signed (4pages) 2 braces  Their fax number is [phone] Phone number [phone]

## 2024-11-27 ENCOUNTER — Telehealth: Payer: Self-pay | Admitting: Family Medicine

## 2024-11-27 ENCOUNTER — Other Ambulatory Visit: Payer: Self-pay | Admitting: Family Medicine

## 2024-11-27 DIAGNOSIS — F419 Anxiety disorder, unspecified: Secondary | ICD-10-CM

## 2024-11-27 NOTE — Telephone Encounter (Signed)
 She did not ask for a brace.  This company called her and tried to talk her into a brace.  She is not interested.   She is drinking green tea without caffeine now.

## 2024-11-29 ENCOUNTER — Ambulatory Visit: Payer: Self-pay

## 2024-11-29 ENCOUNTER — Emergency Department
Admission: EM | Admit: 2024-11-29 | Discharge: 2024-11-29 | Disposition: A | Discharge status: Home / Self Care | Attending: Emergency Medicine | Admitting: Emergency Medicine

## 2024-11-29 ENCOUNTER — Other Ambulatory Visit: Payer: Self-pay

## 2024-11-29 ENCOUNTER — Emergency Department

## 2024-11-29 DIAGNOSIS — R29810 Facial weakness: Secondary | ICD-10-CM | POA: Insufficient documentation

## 2024-11-29 DIAGNOSIS — R519 Headache, unspecified: Secondary | ICD-10-CM | POA: Insufficient documentation

## 2024-11-29 DIAGNOSIS — G510 Bell's palsy: Secondary | ICD-10-CM

## 2024-11-29 LAB — COMPREHENSIVE METABOLIC PANEL WITH GFR
ALT: 19 U/L (ref 0–44)
AST: 23 U/L (ref 15–41)
Albumin: 4 g/dL (ref 3.5–5.0)
Alkaline Phosphatase: 74 U/L (ref 38–126)
Anion gap: 11 (ref 5–15)
BUN: 15 mg/dL (ref 8–23)
CO2: 26 mmol/L (ref 22–32)
Calcium: 9.9 mg/dL (ref 8.9–10.3)
Chloride: 104 mmol/L (ref 98–111)
Creatinine, Ser: 0.88 mg/dL (ref 0.44–1.00)
GFR, Estimated: >60 mL/min
Glucose, Bld: 195 mg/dL — ABNORMAL HIGH (ref 70–99)
Potassium: 3.9 mmol/L (ref 3.5–5.1)
Sodium: 140 mmol/L (ref 135–145)
Total Bilirubin: 0.5 mg/dL (ref 0.0–1.2)
Total Protein: 7 g/dL (ref 6.5–8.1)

## 2024-11-29 LAB — DIFFERENTIAL
Abs Immature Granulocytes: 0.04 10*3/uL (ref 0.00–0.07)
Basophils Absolute: 0.1 10*3/uL (ref 0.0–0.1)
Basophils Relative: 1 %
Eosinophils Absolute: 0.4 10*3/uL (ref 0.0–0.5)
Eosinophils Relative: 4 %
Immature Granulocytes: 0 %
Lymphocytes Relative: 22 %
Lymphs Abs: 2 10*3/uL (ref 0.7–4.0)
Monocytes Absolute: 0.7 10*3/uL (ref 0.1–1.0)
Monocytes Relative: 7 %
Neutro Abs: 6 10*3/uL (ref 1.7–7.7)
Neutrophils Relative %: 66 %

## 2024-11-29 LAB — APTT: aPTT: 28 s (ref 24–36)

## 2024-11-29 LAB — CBC
HCT: 40.6 % (ref 36.0–46.0)
Hemoglobin: 13.6 g/dL (ref 12.0–15.0)
MCH: 30 pg (ref 26.0–34.0)
MCHC: 33.5 g/dL (ref 30.0–36.0)
MCV: 89.4 fL (ref 80.0–100.0)
Platelets: 132 10*3/uL — ABNORMAL LOW (ref 150–400)
RBC: 4.54 MIL/uL (ref 3.87–5.11)
RDW: 13.2 % (ref 11.5–15.5)
WBC: 9.2 10*3/uL (ref 4.0–10.5)
nRBC: 0 % (ref 0.0–0.2)

## 2024-11-29 LAB — CBG MONITORING, ED: Glucose-Capillary: 177 mg/dL — ABNORMAL HIGH (ref 70–99)

## 2024-11-29 LAB — PROTIME-INR
INR: 1 (ref 0.8–1.2)
Prothrombin Time: 14 s (ref 11.4–15.2)

## 2024-11-29 MED ORDER — ARTIFICIAL TEARS OPHTHALMIC OINT: TOPICAL_OINTMENT | Freq: Every evening | OPHTHALMIC | 0 refills | Status: AC | PRN

## 2024-11-29 MED ORDER — ARTIFICIAL TEARS OPHTHALMIC OINT
1.0000 | TOPICAL_OINTMENT | Freq: Once | OPHTHALMIC | Status: AC
  Administered 2024-11-29: 1 via OPHTHALMIC
  Filled 2024-11-29: qty 1

## 2024-11-29 MED ORDER — PREDNISONE 20 MG PO TABS
60.0000 mg | ORAL_TABLET | Freq: Once | ORAL | Status: AC
  Administered 2024-11-29: 60 mg via ORAL
  Filled 2024-11-29: qty 3

## 2024-11-29 MED ORDER — PREDNISONE 50 MG PO TABS: 50.0000 mg | ORAL_TABLET | Freq: Every day | ORAL | 0 refills | Status: AC

## 2024-11-29 MED ORDER — VALACYCLOVIR HCL 500 MG PO TABS: 1000.0000 mg | ORAL_TABLET | Freq: Three times a day (TID) | ORAL | 0 refills | Status: AC

## 2024-11-29 MED ORDER — PREDNISONE 10 MG (21) PO TBPK: ORAL_TABLET | ORAL | 0 refills | Status: AC

## 2024-11-29 MED ORDER — VALACYCLOVIR HCL 500 MG PO TABS
1000.0000 mg | ORAL_TABLET | Freq: Once | ORAL | Status: AC
  Administered 2024-11-29: 1000 mg via ORAL
  Filled 2024-11-29: qty 2

## 2024-11-29 NOTE — ED Notes (Signed)
 Shoes, fall risk bracelet and bed alarm on. Stretcher locked and in lowest position. Call bell within reach

## 2024-11-29 NOTE — Telephone Encounter (Signed)
 FYI Only or Action Required?: FYI only for provider: 911/ ER advised .  Patient was last seen in primary care on 11/17/2024 by Ziglar, Susan K, MD.  Called Nurse Triage reporting Numbness.  Symptoms began yesterday.  Interventions attempted: Nothing.  Symptoms are: unchanged.  Triage Disposition: Call EMS 911 Now  Patient/caregiver understands and will follow disposition?: Yes          Reason for Disposition  [1] Numbness (i.e., loss of sensation) of the face, arm / hand, or leg / foot on one side of the body AND [2] sudden onset AND [3] present now  Answer Assessment - Initial Assessment Questions Patient reports last Wednesday went to dentist , novocain in a crown on tooth. Temp filling now. The numbness was wearing off resolved   until yesterday evening, last evening left eye couldn't close it and numbness has come back and lip was slanted to the left and can't close the eye. Left eyelid dropping . Smile is not symmetrical.  Has increased dizziness today. Has numbness left side of face since yesterday evening. Came on suddenly yesterday evening. sudden onset of numbness and weakness advised 911 now for EMS to come out patient is agreeable and is with caregiver victory who agrees to call 911 now.  FYI back to office patient is with caregiver who is calling 911 now. Patient declined for patient to remain on call while caregiver called 911.  1. SYMPTOM: What is the main symptom you are concerned about? (e.g., weakness, numbness)     Numbness left side of face that started last night sudden onset  2. ONSET: When did this start? (e.g., minutes, hours, days; while sleeping)    Last night  3. LAST NORMAL: When was the last time you (the patient) were normal (no symptoms)?     Before symptoms started last night  4. PATTERN Does this come and go, or has it been constant since it started?  Is it present now?     Constant  5. CARDIAC SYMPTOMS: Have you had any of the following  symptoms: chest pain, difficulty breathing, palpitations?     Denies chest pain or shortness of breath  6. NEUROLOGIC SYMPTOMS: Have you had any of the following symptoms: headache, dizziness, vision loss, double vision, changes in speech, unsteady on your feet?     Numbness left side of face and weakness, smile is asymmetrical  7. OTHER SYMPTOMS: Do you have any other symptoms?     Dizziness , has baseline dizziness this is worse than usual    Patient denies chest pain, shortness of breath  Protocols used: [facility]   Message from Akaska F sent at 11/29/2024 11:47 AM EST  Reason for Triage: Patients can't close left eye and left side of mouth is numb-had dental work recently, doesn't know what to do

## 2024-11-29 NOTE — ED Provider Notes (Signed)
 "  [facility] Provider Note    Event Date/Time   First MD Initiated Contact with Patient 11/29/24 1322     (approximate)   History   Facial Droop   HPI  [name] is a 79 y.o. female who presents to the emergency department today because of concerns for left facial weakness.  She first noted noticed the symptoms around 3:00 yesterday.  They have progressively gotten worse.  She feels numbness to that side and noticed that she was having a tough time closing her eyes today.  She did have some dental work done on that side a couple of days ago.  Patient denies similar symptoms in the past.  Does have a very mild headache.     Physical Exam   Triage Vital Signs: ED Triage Vitals  Encounter Vitals Group     BP 11/29/24 1307 105/65     Girls Systolic BP Percentile --      Girls Diastolic BP Percentile --      Boys Systolic BP Percentile --      Boys Diastolic BP Percentile --      Pulse Rate 11/29/24 1307 96     Resp 11/29/24 1307 16     Temp 11/29/24 1307 98.2 F (36.8 C)     Temp Source 11/29/24 1307 Oral     SpO2 11/29/24 1307 97 %     Weight --      Height --      Head Circumference --      Peak Flow --      Pain Score 11/29/24 1305 0     Pain Loc --      Pain Education --      Exclude from Growth Chart --     Most recent vital signs: Vitals:   11/29/24 1515 11/29/24 1618  BP:  136/87  Pulse: 94 93  Resp: 20 17  Temp:  98.7 F (37.1 C)  SpO2: 94% 100%   General: Awake, alert, oriented. CV:  Good peripheral perfusion. Regular rate and rhythm. Resp:  Normal effort. Lungs clear. Abd:  No distention.  Other:  Left facial droop. Decreased ability to elevate left eye brow. Strength 5/5 in upper and lower extremities. Sensation grossly intact in all extremities.    ED Results / Procedures / Treatments   Labs (all labs ordered are listed, but only abnormal results are displayed) Labs Reviewed  CBC - Abnormal; Notable for the  following components:      Result Value   Platelets 132 (*)    All other components within normal limits  COMPREHENSIVE METABOLIC PANEL WITH GFR - Abnormal; Notable for the following components:   Glucose, Bld 195 (*)    All other components within normal limits  CBG MONITORING, ED - Abnormal; Notable for the following components:   Glucose-Capillary 177 (*)    All other components within normal limits  PROTIME-INR  APTT  DIFFERENTIAL     EKG  I, Guadalupe Eagles, attending physician, personally viewed and interpreted this EKG  EKG Time: 1356 Rate: 98 Rhythm: sinus rhythm Axis: left axis deviation Intervals: qtc 488 QRS: narrow, q waves V1, V2 ST changes: no st elevation Impression: abnormal ekg  RADIOLOGY MR pending   PROCEDURES:  Critical Care performed: No    MEDICATIONS ORDERED IN ED: Medications - No data to display   IMPRESSION / MDM / ASSESSMENT AND PLAN / ED COURSE  I reviewed the triage vital signs and  the nursing notes.                              Differential diagnosis includes, but is not limited to, bell's palsy, CVA  Patient's presentation is most consistent with acute presentation with potential threat to life or bodily function.   Patient presented to the emergency department today because concerns for left facial droop and weakness.  Exam is consistent with Bell's palsy.  I would have a lower concern for stroke based on clinical exam however given patient's age will obtain MRI.  Blood work without concerning electrolyte abnormalities.  MRI pending at time of signout.  FINAL CLINICAL IMPRESSION(S) / ED DIAGNOSES   Final diagnoses:  Facial droop        Rx / DC Orders   ED Discharge Orders     None        Note:  This document was prepared using Dragon voice recognition software and may include unintentional dictation errors.    Floy Roberts, MD 11/29/24 [phone]  "

## 2024-11-29 NOTE — ED Notes (Signed)
 Crackers given to pt.  Pt alert, watching tv.  Pt has no ride home.  [facility] will not take pt home tonight due to weather/road conditions.

## 2024-11-29 NOTE — ED Notes (Addendum)
 Resumed care from [facility] rn.  Pt to mri.  Pt alert  pt reports pain in left eye and has a facial droop.

## 2024-11-29 NOTE — ED Triage Notes (Addendum)
 Pt coming from home via [facility] due to left sided facial drooping. LKW was 3:00pm yesturday. Pt has headache of 4/10 and complains of blurred vision in left eye. Pt has recent dental work done the 21st of this month without experiencing numbness of drooping. No acute distresst at this time. Pt A&Ox4.

## 2024-11-29 NOTE — ED Notes (Signed)
 Son called and states he will come pick up pt.

## 2024-12-08 ENCOUNTER — Other Ambulatory Visit: Payer: Self-pay

## 2024-12-08 DIAGNOSIS — F419 Anxiety disorder, unspecified: Secondary | ICD-10-CM

## 2024-12-08 NOTE — Telephone Encounter (Signed)
 Copied from CRM #[id number]. Topic: Clinical - Medication Refill >> Dec 08, 2024 12:06 PM Rosaria E wrote: Medication:  ALPRAZolam  (XANAX ) 0.5 MG tablet   Has the patient contacted their pharmacy? Yes (Agent: If no, request that the patient contact the pharmacy for the refill. If patient does not wish to contact the pharmacy document the reason why and proceed with request.) (Agent: If yes, when and what did the pharmacy advise?)  This is the patient's preferred pharmacy:  [facility] [address] [place] - [address] [place] [address] Phone: [phone] Fax: [phone]  Is this the correct pharmacy for this prescription? Yes If no, delete pharmacy and type the correct one.   Has the prescription been filled recently? Yes  Is the patient out of the medication? No.   Has the patient been seen for an appointment in the last year OR does the patient have an upcoming appointment? Yes  Can we respond through MyChart? Yes  Agent: Please be advised that Rx refills may take up to 3 business days. We ask that you follow-up with your pharmacy.

## 2024-12-08 NOTE — Telephone Encounter (Signed)
 Requesting refill

## 2024-12-09 MED ORDER — ALPRAZOLAM 0.5 MG PO TABS: 0.5000 mg | ORAL_TABLET | Freq: Two times a day (BID) | ORAL | 0 refills | Status: AC | PRN

## 2024-12-09 NOTE — Telephone Encounter (Signed)
 Last filled on 10/26/2024 #[id number]

## 2024-12-21 ENCOUNTER — Ambulatory Visit: Admitting: Family Medicine

## 2024-12-22 ENCOUNTER — Inpatient Hospital Stay: Admitting: Family Medicine
